# Patient Record
Sex: Female | Born: 1945 | Race: Black or African American | Hispanic: No | Smoking: Former smoker
Health system: Southern US, Community
[De-identification: ages and names within clinical notes are randomized; demographics above are authoritative.]

## PROBLEM LIST (undated history)

## (undated) DIAGNOSIS — E1142 Type 2 diabetes mellitus with diabetic polyneuropathy: Secondary | ICD-10-CM

## (undated) DIAGNOSIS — I10 Essential (primary) hypertension: Secondary | ICD-10-CM

## (undated) DIAGNOSIS — G8929 Other chronic pain: Secondary | ICD-10-CM

## (undated) DIAGNOSIS — M1990 Unspecified osteoarthritis, unspecified site: Secondary | ICD-10-CM

## (undated) DIAGNOSIS — E119 Type 2 diabetes mellitus without complications: Secondary | ICD-10-CM

## (undated) DIAGNOSIS — M545 Low back pain, unspecified: Secondary | ICD-10-CM

## (undated) DIAGNOSIS — M25561 Pain in right knee: Secondary | ICD-10-CM

## (undated) DIAGNOSIS — D649 Anemia, unspecified: Secondary | ICD-10-CM

## (undated) DIAGNOSIS — M109 Gout, unspecified: Secondary | ICD-10-CM

## (undated) DIAGNOSIS — M25562 Pain in left knee: Secondary | ICD-10-CM

## (undated) DIAGNOSIS — R269 Unspecified abnormalities of gait and mobility: Secondary | ICD-10-CM

## (undated) DIAGNOSIS — N189 Chronic kidney disease, unspecified: Secondary | ICD-10-CM

## (undated) DIAGNOSIS — M6282 Rhabdomyolysis: Secondary | ICD-10-CM

## (undated) DIAGNOSIS — R29898 Other symptoms and signs involving the musculoskeletal system: Secondary | ICD-10-CM

## (undated) DIAGNOSIS — H409 Unspecified glaucoma: Secondary | ICD-10-CM

## (undated) DIAGNOSIS — I509 Heart failure, unspecified: Secondary | ICD-10-CM

## (undated) HISTORY — DX: Unspecified abnormalities of gait and mobility: R26.9

## (undated) HISTORY — DX: Gout, unspecified: M10.9

## (undated) HISTORY — PX: OTHER SURGICAL HISTORY: SHX169

## (undated) HISTORY — DX: Other chronic pain: G89.29

## (undated) HISTORY — DX: Heart failure, unspecified: I50.9

## (undated) HISTORY — DX: Unspecified glaucoma: H40.9

## (undated) HISTORY — DX: Anemia, unspecified: D64.9

## (undated) HISTORY — PX: TIBIA FRACTURE SURGERY: SHX806

## (undated) HISTORY — DX: Chronic kidney disease, unspecified: N18.9

## (undated) HISTORY — DX: Other symptoms and signs involving the musculoskeletal system: R29.898

## (undated) HISTORY — DX: Type 2 diabetes mellitus with diabetic polyneuropathy: E11.42

## (undated) HISTORY — PX: FRACTURE SURGERY: SHX138

## (undated) HISTORY — DX: Pain in right knee: M25.561

## (undated) HISTORY — DX: Pain in left knee: M25.562

## (undated) HISTORY — DX: Rhabdomyolysis: M62.82

---

## 1997-11-11 ENCOUNTER — Ambulatory Visit: Admission: RE | Admit: 1997-11-11 | Discharge: 1997-11-11 | Payer: Self-pay | Admitting: Internal Medicine

## 1997-12-16 ENCOUNTER — Other Ambulatory Visit: Admission: RE | Admit: 1997-12-16 | Discharge: 1997-12-16 | Payer: Self-pay | Admitting: Internal Medicine

## 1998-01-07 ENCOUNTER — Ambulatory Visit (HOSPITAL_COMMUNITY): Admission: RE | Admit: 1998-01-07 | Discharge: 1998-01-07 | Payer: Self-pay | Admitting: Gastroenterology

## 1998-12-30 ENCOUNTER — Ambulatory Visit (HOSPITAL_COMMUNITY): Admission: RE | Admit: 1998-12-30 | Discharge: 1998-12-30 | Payer: Self-pay | Admitting: Internal Medicine

## 1998-12-30 ENCOUNTER — Encounter: Payer: Self-pay | Admitting: Internal Medicine

## 1999-09-15 ENCOUNTER — Encounter: Admission: RE | Admit: 1999-09-15 | Discharge: 1999-09-15 | Payer: Self-pay | Admitting: Obstetrics & Gynecology

## 1999-09-30 ENCOUNTER — Encounter: Payer: Self-pay | Admitting: Obstetrics & Gynecology

## 1999-09-30 ENCOUNTER — Ambulatory Visit (HOSPITAL_COMMUNITY): Admission: RE | Admit: 1999-09-30 | Discharge: 1999-09-30 | Payer: Self-pay | Admitting: Obstetrics & Gynecology

## 1999-11-05 ENCOUNTER — Encounter: Admission: RE | Admit: 1999-11-05 | Discharge: 1999-11-05 | Payer: Self-pay | Admitting: Obstetrics

## 2000-07-15 ENCOUNTER — Ambulatory Visit (HOSPITAL_COMMUNITY): Admission: RE | Admit: 2000-07-15 | Discharge: 2000-07-15 | Payer: Self-pay | Admitting: Internal Medicine

## 2000-07-15 ENCOUNTER — Encounter: Payer: Self-pay | Admitting: Internal Medicine

## 2000-08-04 ENCOUNTER — Other Ambulatory Visit: Admission: RE | Admit: 2000-08-04 | Discharge: 2000-08-04 | Payer: Self-pay | Admitting: Internal Medicine

## 2000-08-13 ENCOUNTER — Emergency Department (HOSPITAL_COMMUNITY): Admission: EM | Admit: 2000-08-13 | Discharge: 2000-08-13 | Payer: Self-pay | Admitting: Emergency Medicine

## 2000-08-13 ENCOUNTER — Encounter: Payer: Self-pay | Admitting: Emergency Medicine

## 2000-12-15 ENCOUNTER — Encounter: Admission: RE | Admit: 2000-12-15 | Discharge: 2000-12-15 | Payer: Self-pay | Admitting: Internal Medicine

## 2000-12-15 ENCOUNTER — Encounter: Payer: Self-pay | Admitting: Internal Medicine

## 2001-02-02 ENCOUNTER — Encounter: Admission: RE | Admit: 2001-02-02 | Discharge: 2001-02-02 | Payer: Self-pay | Admitting: Obstetrics

## 2001-02-26 ENCOUNTER — Encounter: Payer: Self-pay | Admitting: Internal Medicine

## 2001-02-26 ENCOUNTER — Emergency Department (HOSPITAL_COMMUNITY): Admission: EM | Admit: 2001-02-26 | Discharge: 2001-02-26 | Payer: Self-pay | Admitting: Emergency Medicine

## 2001-10-16 ENCOUNTER — Ambulatory Visit (HOSPITAL_BASED_OUTPATIENT_CLINIC_OR_DEPARTMENT_OTHER): Admission: RE | Admit: 2001-10-16 | Discharge: 2001-10-16 | Payer: Self-pay | Admitting: *Deleted

## 2001-11-07 ENCOUNTER — Other Ambulatory Visit: Admission: RE | Admit: 2001-11-07 | Discharge: 2001-11-07 | Payer: Self-pay | Admitting: Internal Medicine

## 2001-11-17 ENCOUNTER — Ambulatory Visit (HOSPITAL_COMMUNITY): Admission: RE | Admit: 2001-11-17 | Discharge: 2001-11-17 | Payer: Self-pay | Admitting: Internal Medicine

## 2001-11-17 ENCOUNTER — Encounter: Payer: Self-pay | Admitting: Internal Medicine

## 2002-03-06 ENCOUNTER — Encounter: Payer: Self-pay | Admitting: Emergency Medicine

## 2002-03-06 ENCOUNTER — Emergency Department (HOSPITAL_COMMUNITY): Admission: EM | Admit: 2002-03-06 | Discharge: 2002-03-06 | Payer: Self-pay

## 2002-03-09 ENCOUNTER — Ambulatory Visit (HOSPITAL_COMMUNITY): Admission: RE | Admit: 2002-03-09 | Discharge: 2002-03-09 | Payer: Self-pay | Admitting: Emergency Medicine

## 2002-03-09 ENCOUNTER — Encounter: Payer: Self-pay | Admitting: Emergency Medicine

## 2002-12-06 ENCOUNTER — Encounter: Payer: Self-pay | Admitting: *Deleted

## 2002-12-06 ENCOUNTER — Encounter: Admission: RE | Admit: 2002-12-06 | Discharge: 2002-12-06 | Payer: Self-pay | Admitting: *Deleted

## 2002-12-07 ENCOUNTER — Ambulatory Visit (HOSPITAL_BASED_OUTPATIENT_CLINIC_OR_DEPARTMENT_OTHER): Admission: RE | Admit: 2002-12-07 | Discharge: 2002-12-07 | Payer: Self-pay | Admitting: *Deleted

## 2003-02-11 ENCOUNTER — Encounter: Payer: Self-pay | Admitting: Internal Medicine

## 2003-02-11 ENCOUNTER — Encounter: Admission: RE | Admit: 2003-02-11 | Discharge: 2003-02-11 | Payer: Self-pay | Admitting: Internal Medicine

## 2005-12-15 ENCOUNTER — Encounter: Admission: RE | Admit: 2005-12-15 | Discharge: 2005-12-15 | Payer: Self-pay | Admitting: Internal Medicine

## 2007-02-02 ENCOUNTER — Encounter: Admission: RE | Admit: 2007-02-02 | Discharge: 2007-02-02 | Payer: Self-pay | Admitting: Internal Medicine

## 2008-02-28 ENCOUNTER — Encounter: Admission: RE | Admit: 2008-02-28 | Discharge: 2008-02-28 | Payer: Self-pay | Admitting: Internal Medicine

## 2009-03-18 ENCOUNTER — Encounter: Admission: RE | Admit: 2009-03-18 | Discharge: 2009-03-18 | Payer: Self-pay | Admitting: Internal Medicine

## 2010-04-07 ENCOUNTER — Encounter: Admission: RE | Admit: 2010-04-07 | Discharge: 2010-04-07 | Payer: Self-pay | Admitting: Internal Medicine

## 2010-07-10 ENCOUNTER — Emergency Department (HOSPITAL_COMMUNITY)
Admission: EM | Admit: 2010-07-10 | Discharge: 2010-07-10 | Disposition: A | Discharge status: Home / Self Care | Payer: No Typology Code available for payment source | Attending: Emergency Medicine | Admitting: Emergency Medicine

## 2010-07-10 ENCOUNTER — Emergency Department (HOSPITAL_COMMUNITY): Payer: Self-pay

## 2010-07-10 DIAGNOSIS — M549 Dorsalgia, unspecified: Secondary | ICD-10-CM | POA: Insufficient documentation

## 2010-07-10 DIAGNOSIS — I10 Essential (primary) hypertension: Secondary | ICD-10-CM | POA: Insufficient documentation

## 2010-10-16 NOTE — H&P (Signed)
[facility]  Patient:    Sarah Colon, Sarah Colon Visit Number: [id number] MRN: [id number]          Service Type: NES Location: [place] Attending Physician:  Larene Beach Dictated by:   Janit Pagan., M.D. Admit Date:  10/16/2001                           History and Physical  CHIEF COMPLAINT:  This patient, age 65, was seen in our office on Oct 04, 2001, with a chief complaint of dysuria and getting up three to four times a night, and this had been getting progressively worse for at least seven days.  HISTORY OF PRESENT ILLNESS:  This 65 year old female has been followed at irregular intervals in our office since 1992, when she had transverse myelitis and had difficulty voiding following this problem.  Investigations revealed that she had chronic cystitis, urethral strictures, and she had dilatation under anesthesia done in 1993, and again in 1997.  When she was seen in our office on Oct 04, 2001, she was dilated to 57 [place], had about 5 or 6 oz of residual urine, was given Urecholine 25 mg to use p.r.n. to help her void more frequently, Cipro 500 mg daily pending culture report, which showed no growth. The patient called back on Oct 13, 2001, reporting that she was still having bladder pain, voiding frequently in small amounts, and did not feel like she was emptying her bladder well.  The patient had not passed any gross blood, gravel, or stone.  Had no fever.  She was scheduled on Oct 16, 2001, for dilatation under anesthesia.  ALLERGIES:  CODEINE.  PRESENT MEDICATIONS:  Darvocet-N 100.  Amitriptyline at bedtime.  Hyzaar for hypertension.  Cipro for prophylaxis against infection.  Urecholine 25 mg p.r.n. to help her void.  PAST MEDICAL HISTORY:  The patient has had cystoscopy, hydrodistention, and urethral dilation carried out in 1993, and again in 1997.  She has had two back operations in 1992.  Suspected of having transverse myelitis.   Has had some difficulty with her bladder ever since that time.  FAMILY HISTORY:  Father died of cirrhosis, age 67.  Mother living and well, in her late 27s.  Four brothers, five sisters; one sister with kidney stones. Mother and the patient have had hypertension.  No diabetes in the family.  No other familial diseases noted.  SOCIAL HISTORY:  The patient is an unemployed female, on [place], with her closest relationship to her daughter.  She does not abuse alcohol.  She occasionally smokes.  REVIEW OF SYSTEMS:  General health has been only fair to poor.  Weight has been stable, with moderate obesity.  HEENT:  Unremarkable except for bad eyes. CARDIORESPIRATORY:  Denies any asthma, chest pain, or heart attack.  She does smoke two to three cigarettes daily.  GASTROINTESTINAL:  Denies hepatitis or jaundice but does suffer constipation.  BONES, JOINTS, MUSCLES:  Unremarkable. NEUROPSYCHIATRIC:  Has a bad back.  LYMPHATIC, HEMATOPOIETIC:  No anemia or nodes.  SKIN:  No lesions noted.  PHYSICAL EXAMINATION:  GENERAL:  Well-developed, well-nourished 65 year old female.  VITAL SIGNS:  Weight 140.  Temperature 98.6, pulse 80, respirations 15, blood pressure 159/88.  HEENT:  Ears and tympanic membranes are unremarkable.  Eyes react normally to light and accommodation.  Extraocular movements intact.  Pharynx benign. Teeth in fairly good condition.  NECK:  No enlargement of thyroid.  No nodes  palpable.  CHEST:  Clear to percussion and auscultation.  HEART:  Normal sinus rhythm.  No murmur.  Not enlarged.  BREASTS:  Not examined.  ABDOMEN:  Liver, kidney, spleen, masses, hernia not detected.  She is tender in the suprapubic area.  She has an umbilical scar from laparoscopic surgery.  GENITOURINARY:  External genitalia and meatus are normal.  The support is fairly good.  The uterus is anterior.  Rectal tone is good.  Urethra was tender after dilation.  EXTREMITIES:  No edema.  Good  peripheral pulses.  NEUROLOGIC:  Grossly normal reflexes and sensation.  SKIN:  No lesions.  NEUROLOGIC, HEMATOPOIETIC:  No anemia or nodes.  DIAGNOSES: 1. Urethral stricture. 2. Neurogenic bladder. 3. Recurrent urinary tract infections and chronic cystitis. 4. History of transverse myelitis in 1992. 5. Post back surgery x 2 in 1992.  PLAN:  Cystoscopy, urethral dilation, and hydrodistention. Dictated by:   Janit Pagan., M.D. Attending Physician:  Larene Beach DD:  10/13/01 TD:  10/15/01 Job: [id number]

## 2010-10-16 NOTE — Op Note (Signed)
   NAME:  Sarah Colon, Sarah Colon                        ACCOUNT NO.:  [id number]   MEDICAL RECORD NO.:  [id number]                   PATIENT TYPE:  AMB   LOCATION:  NESC                                 FACILITY:  [facility]   PHYSICIAN:  Raelyn Mora., M.D.            DATE OF BIRTH:  [birth year]   DATE OF PROCEDURE:  12/07/2002  DATE OF DISCHARGE:                                 OPERATIVE REPORT   PREOPERATIVE DIAGNOSES:  1. Interstitial cystitis.  2. Urethral stricture.  3. Neurogenic bladder.  4. Recurrent urinary tract infection.  5. Hypertension.  6. Transverse myelitis at onset of neurogenic bladder in 1992.   POSTOPERATIVE DIAGNOSES:  1. Interstitial cystitis.  2. Urethral stricture.  3. Neurogenic bladder.  4. Recurrent urinary tract infection.  5. Hypertension.  6. Transverse myelitis at onset of neurogenic bladder in 1992.   OPERATION PERFORMED:  1. Cystoscopy.  2. Hydrodistention.  3. Urethral dilation.  4. Clorpactin irrigation of the bladder.   DESCRIPTION OF OPERATION:  This 65 year old female brought to the operating  room after receiving IV gentamicin and underwent successful induction of  general anesthesia, was prepped and draped in the lithotomy position.  The  urethra was dilated to 46 [place] and was moderately tight.  She did have a  significant residual urine estimated at five or six ounces.  The bladder was  inspected with the 70 and 12 degree lenses.  Initially there was only mild  to moderate hyperemia and erythema, no stone or tumor was noted.  The right  and left ureteral orifices were normal.  The patient did have 2-3+  trabeculation of the bladder.  The bladder was then distended to capacity,  which was 800 mL, and decompressed, and she developed severe large and small  hemorrhages throughout the bladder, actually a little less marked on the  trigone than they were on the anterior and lateral walls and posterior  walls.  The patient's bladder was then  emptied and irrigated with 1000 mL of  Clorpactin solution and emptied again, and the patient returned to the  recovery area in a stable condition.   The plan is for this patient to go home on Cipro 1000 XR x3 doses.  She will  return to the office in two weeks, and she will use Darvocet-N 100 p.r.n.  for pain.                                                Raelyn Mora., M.D.    HB/MEDQ  D:  12/07/2002  T:  12/07/2002  Job:  [id number]

## 2010-10-16 NOTE — H&P (Signed)
NAME:  Sarah Colon, Sarah Colon                        ACCOUNT NO.:  [id number]   MEDICAL RECORD NO.:  [id number]                   PATIENT TYPE:  AMB   LOCATION:  NESC                                 FACILITY:  [facility]   PHYSICIAN:  Raelyn Mora., M.D.            DATE OF BIRTH:  December 28, 1945   DATE OF ADMISSION:  DATE OF DISCHARGE:                                HISTORY & PHYSICAL   HISTORY OF PRESENT ILLNESS:  This patient, age 65, is scheduled for  cystoscopy, hydrodistention, and possible Clorpactin at [facility] on December 07, 2002, with a chief complaint of low abdominal pain and  dysuria.  The patient has passed no blood, gravel, or stones.  She gets up  as much as six times a night and voids every one to two hours during the  day.  An ultrasound in our office did reveal 4-5 ounces of residual urine.  She has a history of urethral strictures and neurogenic bladder dating back  to at least 0000000, complicated by urinary tract infections.  At that time,  she also had transverse myelitis.  Recently the patient has had increasing  low abdominal pain.  Her urinalysis showed no growth at our office.  There  was no hydronephrosis, right or left by ultrasound.   ALLERGIES:  She is allergic to CODEINE.   MEDICATIONS:  Her present medications include amitriptyline and Darvocet-N  100.  The patient supposedly has been taking Hyzaar for hypertension.  She  has used Urecholine in the past to help her bladder empty.   PAST MEDICAL HISTORY:  The patient has had urethral dilation, cystoscopy,  and hydrodistention carried out in 1993, in 1997, and in 2003 with some  improvement in her symptoms.  She had back operations in 1992.  She has been  diagnosed with transverse myelitis.  She has had bladder difficulties since  that time.   FAMILY HISTORY:  Her father died of cirrhosis at age 73.  Mother living and  well in her 18s.  Four brothers and five sisters.  One sister with kidney  stones.   Her mother has a hypertension.  There is no diabetes in the family  to the patient's knowledge.   SOCIAL HISTORY:  The patient is an unemployed female in [place] with the  closer relationship to her daughter.  She does not abuse alcohol.  She does  smoke occasionally.   REVIEW OF SYSTEMS:  GENERAL:  Health has been fair to poor.  Weight has been  stable.  She has mild to moderate obesity.  HEENT:  She has bad eyes.  Teeth  are fairly good.  CARDIORESPIRATORY:  Denies any chest pain, heart attack,  or asthma.  She does smoke two or three cigarettes daily.  GASTROINTESTINAL:  She denies hepatitis or jaundice, but does suffer constipation.  BONES,  JOINTS, AND MUSCLES:  Unremarkable.  NEUROPSYCHIATRIC:  She has a bad  back  and chronic back pain.  LYMPHATIC/HEMATOPOIETIC:  No anemia or nodes.  SKIN:  No lesions.   PHYSICAL EXAMINATION:  GENERAL APPEARANCE:  A well-developed, well-  nourished, 65 year old female.  VITAL SIGNS:  The temperature is 98.1 degrees, pulse 82, respirations 18,  and blood pressure 142/71.  WEIGHT:  138 pounds.  HEENT:  The ears and tympanic membranes are unremarkable.  The eyes react  normally to light and accomodation.  Extraocular movements intact.  Pharynx  benign.  Teeth in fair condition.  NECK:  No enlargement of the thyroid.  No nodes palpable.  CHEST:  Clear to auscultation and percussion.  HEART:  Normal sinus rhythm.  No murmur.  Not enlarged.  BREASTS:  Not examined.  ABDOMEN:  Liver, spleen, kidneys, masses, and hernias not detected.  She has  marked suprapubic tenderness.  There is an umbilical scar from laparoscopic  surgery.  PELVIC:  External genitalia and meatus are normal.  Support is fair.  The  uterus is anterior.  Rectal tone is good.  The urethra was tender as was the  bladder.  EXTREMITIES:  No edema.  Good peripheral pulses.  NEUROLOGIC:  Grossly normal reflexes and sensation.  SKIN:  No lesions.  HEMATOPOIETIC/LYMPHATIC:  No nodes  or anemia noted.   DIAGNOSES:  1. Urethral stricture.  2. Neurogenic bladder.  3. Suspect chronic cystitis, possibly interstitial.  4. Transverse myelitis in 1992.  5. Status post back surgeries in 1992 x 2.   PLAN:  1. Urethral dilatation.  2. Cystoscopy.  3. Hydrodistention.  4. Possible Clorpactin.                                               Raelyn Mora., M.D.    HB/MEDQ  D:  12/06/2002  T:  12/06/2002  Job:  [id number]

## 2010-10-16 NOTE — Op Note (Signed)
[facility]  Patient:    Sarah Colon, Sarah Colon Visit Number: [id number] MRN: [id number]          Service Type: NES Location: [place] Attending Physician:  Larene Beach Dictated by:   Janit Pagan., M.D. Proc. Date: 10/16/01 Admit Date:  10/16/2001                             Operative Report  DATE OF BIRTH:  [birth year]  PREOPERATIVE DIAGNOSES: 1. Urethral strictures. 2. Recurrent urinary tract infections. 3. Chronic cystitis. 4. Hypertension. 5. Transverse myelitis and neurogenic bladder 1992.  POSTOPERATIVE DIAGNOSES: 1. Urethral strictures. 2. Recurrent urinary tract infections. 3. Chronic cystitis. 4. Hypertension. 5. Transverse myelitis and neurogenic bladder 1992.  OPERATION PERFORMED: 1. Cystoscopy. 2. Urethral dilation.  DESCRIPTION OF OPERATION:  This patient, age 29, was prepared for surgery with IV gentamicin and underwent successful induction of general anesthesia, was prepped and draped in the lithotomy position.  The urethra, which was tight, was dilated with 28-36 female sounds, and there was minimal amount of bleeding.  The bladder was then inspected with the #22 cystourethroscope using 70 and 12 degree lenses.  The patients bladder showed mild hyperemia and erythema throughout, grade 1 trabeculation; no stone, no tumor was noted in the bladder.  The right and left ureteral orifices were normal.  The trigone had good estrogen effect.  The patients bladder capacity was noted to be 550 mL, and she developed no hemorrhages and only minimal increase in hyperemia and erythema after hydrodistention.  The patients bladder was emptied.  She was examined under anesthesia.  There were no rectal or pelvis noted.  Bladder support was fairly good.  Rectal tone was fairly good.  PLAN: 1. The plan is for the patient to go home on cephalexin 500 b.i.d. for    prophylaxis since her last culture in the office showed no  growth. 2. We will give her Darvocet-N 100 q.6-8h. p.r.n. pain. 3. We will have her return to the office in 1-2 weeks to check her residual    urine. 4. She will continue her Hyzaar and her amitriptyline at bedtime.  She is to    hold off on the Urecholine which did not seem to help her symptoms. Dictated by:   Janit Pagan., M.D. Attending Physician:  Larene Beach DD:  10/16/01 TD:  10/17/01 Job: [id number]

## 2011-04-13 ENCOUNTER — Other Ambulatory Visit: Payer: Self-pay | Admitting: Internal Medicine

## 2011-04-13 DIAGNOSIS — Z1231 Encounter for screening mammogram for malignant neoplasm of breast: Secondary | ICD-10-CM

## 2011-05-06 ENCOUNTER — Ambulatory Visit: Payer: No Typology Code available for payment source

## 2011-05-20 ENCOUNTER — Ambulatory Visit: Payer: No Typology Code available for payment source

## 2011-06-11 ENCOUNTER — Ambulatory Visit: Payer: No Typology Code available for payment source

## 2011-07-07 ENCOUNTER — Ambulatory Visit: Payer: No Typology Code available for payment source

## 2011-07-21 ENCOUNTER — Ambulatory Visit
Admission: RE | Admit: 2011-07-21 | Discharge: 2011-07-21 | Disposition: A | Discharge status: Home / Self Care | Payer: Medicare Other | Source: Ambulatory Visit | Attending: Internal Medicine | Admitting: Internal Medicine

## 2011-07-21 DIAGNOSIS — Z1231 Encounter for screening mammogram for malignant neoplasm of breast: Secondary | ICD-10-CM

## 2013-03-02 ENCOUNTER — Other Ambulatory Visit: Payer: Self-pay | Admitting: Family

## 2013-03-02 ENCOUNTER — Ambulatory Visit
Admission: RE | Admit: 2013-03-02 | Discharge: 2013-03-02 | Disposition: A | Discharge status: Home / Self Care | Payer: Medicare Other | Source: Ambulatory Visit | Attending: Family | Admitting: Family

## 2013-03-02 DIAGNOSIS — M25512 Pain in left shoulder: Secondary | ICD-10-CM

## 2013-04-07 ENCOUNTER — Emergency Department (HOSPITAL_COMMUNITY): Payer: Medicare Other

## 2013-04-07 ENCOUNTER — Encounter (HOSPITAL_COMMUNITY): Payer: Self-pay | Admitting: Emergency Medicine

## 2013-04-07 ENCOUNTER — Inpatient Hospital Stay (HOSPITAL_COMMUNITY)
Admission: EM | Admit: 2013-04-07 | Discharge: 2013-04-17 | DRG: 871 | Disposition: A | Discharge status: Skilled Nursing Facility | Payer: Medicare Other | Attending: Internal Medicine | Admitting: Internal Medicine

## 2013-04-07 DIAGNOSIS — Z7401 Bed confinement status: Secondary | ICD-10-CM

## 2013-04-07 DIAGNOSIS — Z87891 Personal history of nicotine dependence: Secondary | ICD-10-CM

## 2013-04-07 DIAGNOSIS — M25562 Pain in left knee: Secondary | ICD-10-CM

## 2013-04-07 DIAGNOSIS — R32 Unspecified urinary incontinence: Secondary | ICD-10-CM | POA: Diagnosis not present

## 2013-04-07 DIAGNOSIS — N179 Acute kidney failure, unspecified: Secondary | ICD-10-CM | POA: Diagnosis present

## 2013-04-07 DIAGNOSIS — I5032 Chronic diastolic (congestive) heart failure: Secondary | ICD-10-CM | POA: Diagnosis present

## 2013-04-07 DIAGNOSIS — I10 Essential (primary) hypertension: Secondary | ICD-10-CM | POA: Diagnosis present

## 2013-04-07 DIAGNOSIS — K5900 Constipation, unspecified: Secondary | ICD-10-CM | POA: Clinically undetermined

## 2013-04-07 DIAGNOSIS — K7689 Other specified diseases of liver: Secondary | ICD-10-CM | POA: Diagnosis present

## 2013-04-07 DIAGNOSIS — M25569 Pain in unspecified knee: Secondary | ICD-10-CM | POA: Diagnosis present

## 2013-04-07 DIAGNOSIS — E119 Type 2 diabetes mellitus without complications: Secondary | ICD-10-CM

## 2013-04-07 DIAGNOSIS — K760 Fatty (change of) liver, not elsewhere classified: Secondary | ICD-10-CM | POA: Diagnosis present

## 2013-04-07 DIAGNOSIS — E1149 Type 2 diabetes mellitus with other diabetic neurological complication: Secondary | ICD-10-CM | POA: Diagnosis present

## 2013-04-07 DIAGNOSIS — E1142 Type 2 diabetes mellitus with diabetic polyneuropathy: Secondary | ICD-10-CM | POA: Diagnosis present

## 2013-04-07 DIAGNOSIS — E11610 Type 2 diabetes mellitus with diabetic neuropathic arthropathy: Secondary | ICD-10-CM | POA: Diagnosis present

## 2013-04-07 DIAGNOSIS — R700 Elevated erythrocyte sedimentation rate: Secondary | ICD-10-CM

## 2013-04-07 DIAGNOSIS — N289 Disorder of kidney and ureter, unspecified: Secondary | ICD-10-CM

## 2013-04-07 DIAGNOSIS — L02419 Cutaneous abscess of limb, unspecified: Secondary | ICD-10-CM | POA: Diagnosis present

## 2013-04-07 DIAGNOSIS — E876 Hypokalemia: Secondary | ICD-10-CM | POA: Diagnosis present

## 2013-04-07 DIAGNOSIS — R7401 Elevation of levels of liver transaminase levels: Secondary | ICD-10-CM | POA: Diagnosis present

## 2013-04-07 DIAGNOSIS — Z79899 Other long term (current) drug therapy: Secondary | ICD-10-CM

## 2013-04-07 DIAGNOSIS — M109 Gout, unspecified: Secondary | ICD-10-CM | POA: Diagnosis present

## 2013-04-07 DIAGNOSIS — D72829 Elevated white blood cell count, unspecified: Secondary | ICD-10-CM | POA: Diagnosis present

## 2013-04-07 DIAGNOSIS — K759 Inflammatory liver disease, unspecified: Secondary | ICD-10-CM

## 2013-04-07 DIAGNOSIS — D473 Essential (hemorrhagic) thrombocythemia: Secondary | ICD-10-CM | POA: Diagnosis not present

## 2013-04-07 DIAGNOSIS — M064 Inflammatory polyarthropathy: Secondary | ICD-10-CM | POA: Diagnosis present

## 2013-04-07 DIAGNOSIS — G8929 Other chronic pain: Secondary | ICD-10-CM | POA: Diagnosis present

## 2013-04-07 DIAGNOSIS — I5033 Acute on chronic diastolic (congestive) heart failure: Secondary | ICD-10-CM | POA: Diagnosis present

## 2013-04-07 DIAGNOSIS — A419 Sepsis, unspecified organism: Principal | ICD-10-CM | POA: Diagnosis present

## 2013-04-07 DIAGNOSIS — G589 Mononeuropathy, unspecified: Secondary | ICD-10-CM | POA: Diagnosis present

## 2013-04-07 DIAGNOSIS — I509 Heart failure, unspecified: Secondary | ICD-10-CM | POA: Diagnosis present

## 2013-04-07 DIAGNOSIS — Z113 Encounter for screening for infections with a predominantly sexual mode of transmission: Secondary | ICD-10-CM

## 2013-04-07 DIAGNOSIS — M009 Pyogenic arthritis, unspecified: Secondary | ICD-10-CM | POA: Diagnosis present

## 2013-04-07 DIAGNOSIS — M25561 Pain in right knee: Secondary | ICD-10-CM

## 2013-04-07 DIAGNOSIS — R262 Difficulty in walking, not elsewhere classified: Secondary | ICD-10-CM | POA: Diagnosis present

## 2013-04-07 HISTORY — DX: Unspecified osteoarthritis, unspecified site: M19.90

## 2013-04-07 HISTORY — DX: Essential (primary) hypertension: I10

## 2013-04-07 LAB — URINALYSIS, ROUTINE W REFLEX MICROSCOPIC
Bilirubin Urine: NEGATIVE
Ketones, ur: NEGATIVE mg/dL
Nitrite: NEGATIVE
pH: 5 (ref 5.0–8.0)

## 2013-04-07 LAB — POCT I-STAT, CHEM 8
BUN: 35 mg/dL — ABNORMAL HIGH (ref 6–23)
Chloride: 101 mEq/L (ref 96–112)
Creatinine, Ser: 1.6 mg/dL — ABNORMAL HIGH (ref 0.50–1.10)
Hemoglobin: 13.6 g/dL (ref 12.0–15.0)
Potassium: 4 mEq/L (ref 3.5–5.1)
Sodium: 140 mEq/L (ref 135–145)

## 2013-04-07 LAB — URINE MICROSCOPIC-ADD ON

## 2013-04-07 LAB — CBC WITH DIFFERENTIAL/PLATELET
HCT: 34.6 % — ABNORMAL LOW (ref 36.0–46.0)
Hemoglobin: 12.7 g/dL (ref 12.0–15.0)
Lymphocytes Relative: 8 % — ABNORMAL LOW (ref 12–46)
Lymphs Abs: 1.6 10*3/uL (ref 0.7–4.0)
MCV: 81.6 fL (ref 78.0–100.0)
Monocytes Absolute: 2.1 10*3/uL — ABNORMAL HIGH (ref 0.1–1.0)
Monocytes Relative: 10 % (ref 3–12)
Neutro Abs: 16.4 10*3/uL — ABNORMAL HIGH (ref 1.7–7.7)
WBC: 20.2 10*3/uL — ABNORMAL HIGH (ref 4.0–10.5)

## 2013-04-07 MED ORDER — SODIUM CHLORIDE 0.9 % IV BOLUS (SEPSIS)
1000.0000 mL | Freq: Once | INTRAVENOUS | Status: AC
  Administered 2013-04-07: 1000 mL via INTRAVENOUS

## 2013-04-07 MED ORDER — DEXTROSE 5 % IV SOLN
1.0000 g | Freq: Once | INTRAVENOUS | Status: AC
  Administered 2013-04-07: 1 g via INTRAVENOUS
  Filled 2013-04-07: qty 10

## 2013-04-07 MED ORDER — SODIUM CHLORIDE 0.9 % IV BOLUS (SEPSIS)
500.0000 mL | Freq: Once | INTRAVENOUS | Status: AC
  Administered 2013-04-07: 500 mL via INTRAVENOUS

## 2013-04-07 MED ORDER — MORPHINE SULFATE 4 MG/ML IJ SOLN
4.0000 mg | Freq: Once | INTRAMUSCULAR | Status: AC
  Administered 2013-04-07: 4 mg via INTRAVENOUS
  Filled 2013-04-07: qty 1

## 2013-04-07 MED ORDER — ACETAMINOPHEN 325 MG PO TABS
650.0000 mg | ORAL_TABLET | Freq: Once | ORAL | Status: AC
  Administered 2013-04-07: 650 mg via ORAL
  Filled 2013-04-07: qty 2

## 2013-04-07 NOTE — ED Notes (Signed)
Pt arrives via EMS, pt states she started having pain in her neck and side about one month prior and her daughter made her go to her PCP who DX her with RA.  Pt states tonight he daughter made her call 911 after she was having knee pain not relieved by her tramadol.

## 2013-04-07 NOTE — ED Notes (Signed)
Pt taken to xray 

## 2013-04-07 NOTE — ED Provider Notes (Signed)
CSN: [id number]     Arrival date & time 04/07/13  1906 History   First MD Initiated Contact with Patient 04/07/13 1950     Chief Complaint  Patient presents with  . Knee Pain   (Consider location/radiation/quality/duration/timing/severity/associated sxs/prior Treatment) HPI  67 year old female with non-complicated diabetes, arthritis presents for evaluation of knee pain. Patient arrived via EMS. Patient states she does have a history of arthritis with occasional joint pain however for the past week she has been having progressive worsening pain to several joints including her shoulders and her knees. Describe pain as a throbbing sensation, described as if somebody is pounding on it persistently. Pain persists throughout the day and for the past 3 days she is having increased difficulty ambulating even when using a cane. She tries taking Ultram at home which provide minimal relief. States she was having some joint discomfort and did mention that to her PCPs office last week. States she received x-ray of the left shoulder and both knees and was told that she has arthritis. Patient otherwise denies fever chills, nausea vomiting or diarrhea, chest pain or shortness of breath, abdominal pain or back pain, or rash. She denies any vision changes or having dysuria.    Past Medical History  Diagnosis Date  . Arthritis   . Hypertension   . Diabetes mellitus without complication    Past Surgical History  Procedure Laterality Date  . Skin grafts     No family history on file. History  Substance Use Topics  . Smoking status: Former Research scientist (life sciences)  . Smokeless tobacco: Not on file  . Alcohol Use: No   OB History   Grav Para Term Preterm Abortions TAB SAB Ect Mult Living                 Review of Systems  All other systems reviewed and are negative.    Allergies  Codeine  Home Medications   Current Outpatient Rx  Name  Route  Sig  Dispense  Refill  . traMADol (ULTRAM) 50 MG tablet   Oral  Take 50 mg by mouth 3 (three) times daily as needed (pain).          . ALPHAGAN P 0.1 % SOLN               . amLODipine-benazepril (LOTREL) 10-20 MG per capsule               . dorzolamide-timolol (COSOPT) 22.3-6.8 MG/ML ophthalmic solution               . gabapentin (NEURONTIN) 600 MG tablet               . lidocaine (LIDODERM) 5 %               . LUMIGAN 0.01 % SOLN               . meloxicam (MOBIC) 15 MG tablet               . metFORMIN (GLUCOPHAGE-XR) 500 MG 24 hr tablet               . sulfamethoxazole-trimethoprim (BACTRIM DS) 800-160 MG per tablet               . valsartan-hydrochlorothiazide (DIOVAN-HCT) 320-25 MG per tablet               . VICODIN 5-300 MG TABS  BP 151/85  Pulse 113  Temp(Src) 98.8 F (37.1 C) (Oral)  Resp 19  Ht 4\' 11"  (1.499 m)  Wt 124 lb (56.246 kg)  BMI 25.03 kg/m2  SpO2 96% Physical Exam  Nursing note and vitals reviewed. Constitutional: She is oriented to person, place, and time.  Thin appearing female appears uncomfortable, but non toxic  HENT:  Head: Normocephalic and atraumatic.  Eyes: Conjunctivae are normal.  Neck: Normal range of motion. Neck supple.  Cardiovascular:  Tachycardia without M/R/G  Pulmonary/Chest: Breath sounds normal.  Abdominal: Soft.  Musculoskeletal: She exhibits tenderness (generalized tenderness to bilateral shoulder, bilateral knees and bilateral ankle with decreased ROM but no erythema or edema suggestive of septic arthritis). She exhibits no edema.  Neurological: She is alert and oriented to person, place, and time.  4/5 strength to all 4 extremities.      Skin:  Red streak on L anterior knee, and 1st MTP of R foot  Bilateral knee warm to the touch    ED Course  Procedures (including critical care time)    8:07 PM Pt here with polyarthralgia.  She is warm to the touch, and tender thoughout most major joints.  Doubt septic arthritis.   Pt has strong urine odor.  Plan to check for possible source of infection by obtaining CXR, UA, and also check rectal temp.  Will also check CRP, and Sed rate along with CK.  Rocephin given for suspect UTI.  Care discussed with attending.    10:56 PM Pt has WBC of 20.2, no recent steroid use to account for elevated WBC. Sed rate is elevated at 116. She is febrile. Tylenol given. Since her knees are warm to the touch will obtain xray to r/o osteo although less likely as it is bilateral.  No headache to suggest meningitis.  No knee surgical history worrisome for hardware infection.  Will consult [facility] for admission.    11:48 PM I have consulted with [facility], Dr. Posey Pronto, who agrees to admit pt to tele, team 10, under his care.    Labs Review Labs Reviewed  CBC WITH DIFFERENTIAL - Abnormal; Notable for the following:    WBC 20.2 (*)    HCT 34.6 (*)    MCHC 36.7 (*)    Platelets 539 (*)    Neutrophils Relative % 81 (*)    Neutro Abs 16.4 (*)    Lymphocytes Relative 8 (*)    Monocytes Absolute 2.1 (*)    All other components within normal limits  SEDIMENTATION RATE - Abnormal; Notable for the following:    Sed Rate 116 (*)    All other components within normal limits  URINALYSIS, ROUTINE W REFLEX MICROSCOPIC - Abnormal; Notable for the following:    Hgb urine dipstick TRACE (*)    All other components within normal limits  POCT I-STAT, CHEM 8 - Abnormal; Notable for the following:    BUN 35 (*)    Creatinine, Ser 1.60 (*)    Glucose, Bld 116 (*)    All other components within normal limits  CULTURE, BLOOD (ROUTINE X 2)  CULTURE, BLOOD (ROUTINE X 2)  URINE MICROSCOPIC-ADD ON  C-REACTIVE PROTEIN  CK  COMPREHENSIVE METABOLIC PANEL   Imaging Review Dg Chest 2 View  04/07/2013   CLINICAL DATA:  Fever  EXAM: CHEST  2 VIEW  COMPARISON:  None.  FINDINGS: The heart size and mediastinal contours are within normal limits. Both lungs are clear. The visualized skeletal  structures are unremarkable.  IMPRESSION: No active cardiopulmonary disease.   Electronically Signed   By: Skipper Cliche M.D.   On: 04/07/2013 22:32    EKG Interpretation   None       MDM   1. Knee pain, bilateral   2. Leukocytosis   3. Elevated sed rate   4. Renal insufficiency    BP 148/69  Pulse 117  Temp(Src) 101.2 F (38.4 C) (Rectal)  Resp 19  Ht 4\' 11"  (1.499 m)  Wt 124 lb (56.246 kg)  BMI 25.03 kg/m2  SpO2 98%     Domenic Moras, PA-C 04/11/13 1501

## 2013-04-07 NOTE — ED Notes (Signed)
Pt back from x-ray.

## 2013-04-08 ENCOUNTER — Encounter (HOSPITAL_COMMUNITY): Payer: Self-pay | Admitting: General Practice

## 2013-04-08 ENCOUNTER — Inpatient Hospital Stay (HOSPITAL_COMMUNITY): Payer: Medicare Other

## 2013-04-08 DIAGNOSIS — E119 Type 2 diabetes mellitus without complications: Secondary | ICD-10-CM

## 2013-04-08 DIAGNOSIS — M009 Pyogenic arthritis, unspecified: Secondary | ICD-10-CM

## 2013-04-08 DIAGNOSIS — A419 Sepsis, unspecified organism: Secondary | ICD-10-CM | POA: Diagnosis present

## 2013-04-08 DIAGNOSIS — M25569 Pain in unspecified knee: Secondary | ICD-10-CM

## 2013-04-08 DIAGNOSIS — R700 Elevated erythrocyte sedimentation rate: Secondary | ICD-10-CM

## 2013-04-08 DIAGNOSIS — E11610 Type 2 diabetes mellitus with diabetic neuropathic arthropathy: Secondary | ICD-10-CM | POA: Diagnosis present

## 2013-04-08 LAB — CBC
HCT: 32.7 % — ABNORMAL LOW (ref 36.0–46.0)
Platelets: 516 10*3/uL — ABNORMAL HIGH (ref 150–400)
RBC: 3.92 MIL/uL (ref 3.87–5.11)
RDW: 14.3 % (ref 11.5–15.5)
WBC: 18.9 10*3/uL — ABNORMAL HIGH (ref 4.0–10.5)

## 2013-04-08 LAB — HEPATITIS PANEL, ACUTE
Hep A IgM: NONREACTIVE
Hepatitis B Surface Ag: NEGATIVE

## 2013-04-08 LAB — URIC ACID: Uric Acid, Serum: 9.9 mg/dL — ABNORMAL HIGH (ref 2.4–7.0)

## 2013-04-08 LAB — COMPREHENSIVE METABOLIC PANEL
ALT: 165 U/L — ABNORMAL HIGH (ref 0–35)
AST: 137 U/L — ABNORMAL HIGH (ref 0–37)
Alkaline Phosphatase: 190 U/L — ABNORMAL HIGH (ref 39–117)
Alkaline Phosphatase: 193 U/L — ABNORMAL HIGH (ref 39–117)
BUN: 34 mg/dL — ABNORMAL HIGH (ref 6–23)
CO2: 26 mEq/L (ref 19–32)
CO2: 26 mEq/L (ref 19–32)
Chloride: 99 mEq/L (ref 96–112)
GFR calc Af Amer: 54 mL/min — ABNORMAL LOW (ref >90)
GFR calc Af Amer: 54 mL/min — ABNORMAL LOW (ref >90)
Glucose, Bld: 157 mg/dL — ABNORMAL HIGH (ref 70–99)
Glucose, Bld: 159 mg/dL — ABNORMAL HIGH (ref 70–99)
Potassium: 3.4 mEq/L — ABNORMAL LOW (ref 3.5–5.1)
Potassium: 3.4 mEq/L — ABNORMAL LOW (ref 3.5–5.1)
Sodium: 138 mEq/L (ref 135–145)
Total Protein: 7.3 g/dL (ref 6.0–8.3)
Total Protein: 7.4 g/dL (ref 6.0–8.3)

## 2013-04-08 LAB — SYNOVIAL CELL COUNT + DIFF, W/ CRYSTALS
Eosinophils-Synovial: 0 % (ref 0–1)
Lymphocytes-Synovial Fld: 1 % (ref 0–20)
Neutrophil, Synovial: 90 % — ABNORMAL HIGH (ref 0–25)

## 2013-04-08 LAB — GLUCOSE, CAPILLARY
Glucose-Capillary: 164 mg/dL — ABNORMAL HIGH (ref 70–99)
Glucose-Capillary: 128 mg/dL — ABNORMAL HIGH (ref 70–99)
Glucose-Capillary: 208 mg/dL — ABNORMAL HIGH (ref 70–99)
Glucose-Capillary: 186 mg/dL — ABNORMAL HIGH (ref 70–99)
Glucose-Capillary: 161 mg/dL — ABNORMAL HIGH (ref 70–99)

## 2013-04-08 LAB — PROTIME-INR: INR: 1.23 (ref 0.00–1.49)

## 2013-04-08 MED ORDER — ACETAMINOPHEN 325 MG PO TABS
650.0000 mg | ORAL_TABLET | Freq: Four times a day (QID) | ORAL | Status: DC | PRN
  Administered 2013-04-08 – 2013-04-12 (×3): 650 mg via ORAL
  Filled 2013-04-08 (×3): qty 2

## 2013-04-08 MED ORDER — VANCOMYCIN HCL 1000 MG IV SOLR
750.0000 mg | INTRAVENOUS | Status: DC
  Administered 2013-04-08 – 2013-04-09 (×2): 750 mg via INTRAVENOUS
  Filled 2013-04-08 (×2): qty 750

## 2013-04-08 MED ORDER — INSULIN ASPART 100 UNIT/ML ~~LOC~~ SOLN
0.0000 [IU] | Freq: Every day | SUBCUTANEOUS | Status: DC
  Administered 2013-04-13: 3 [IU] via SUBCUTANEOUS
  Administered 2013-04-15: 4 [IU] via SUBCUTANEOUS
  Administered 2013-04-16: 2 [IU] via SUBCUTANEOUS

## 2013-04-08 MED ORDER — COLCHICINE 0.6 MG PO TABS
0.6000 mg | ORAL_TABLET | Freq: Three times a day (TID) | ORAL | Status: DC
  Administered 2013-04-08 – 2013-04-09 (×2): 0.6 mg via ORAL
  Filled 2013-04-08 (×4): qty 1

## 2013-04-08 MED ORDER — HYDROMORPHONE HCL PF 1 MG/ML IJ SOLN: 1.0000 mg | INTRAMUSCULAR | Status: DC | PRN

## 2013-04-08 MED ORDER — GABAPENTIN 600 MG PO TABS
600.0000 mg | ORAL_TABLET | Freq: Every day | ORAL | Status: DC
  Administered 2013-04-08: 600 mg via ORAL
  Filled 2013-04-08 (×2): qty 1

## 2013-04-08 MED ORDER — INSULIN ASPART 100 UNIT/ML ~~LOC~~ SOLN
0.0000 [IU] | Freq: Three times a day (TID) | SUBCUTANEOUS | Status: DC
  Administered 2013-04-08: 5 [IU] via SUBCUTANEOUS
  Administered 2013-04-08: 2 [IU] via SUBCUTANEOUS
  Administered 2013-04-08 – 2013-04-10 (×4): 3 [IU] via SUBCUTANEOUS
  Administered 2013-04-11: 2 [IU] via SUBCUTANEOUS
  Administered 2013-04-11: 17:00:00 via SUBCUTANEOUS
  Administered 2013-04-11 – 2013-04-12 (×2): 2 [IU] via SUBCUTANEOUS
  Administered 2013-04-12: 3 [IU] via SUBCUTANEOUS
  Administered 2013-04-13 (×2): 2 [IU] via SUBCUTANEOUS
  Administered 2013-04-14: 3 [IU] via SUBCUTANEOUS
  Administered 2013-04-14 – 2013-04-15 (×3): 2 [IU] via SUBCUTANEOUS
  Administered 2013-04-15 (×2): 3 [IU] via SUBCUTANEOUS
  Administered 2013-04-16: 2 [IU] via SUBCUTANEOUS
  Administered 2013-04-16: 3 [IU] via SUBCUTANEOUS
  Administered 2013-04-16 – 2013-04-17 (×2): 2 [IU] via SUBCUTANEOUS
  Administered 2013-04-17: 3 [IU] via SUBCUTANEOUS
  Administered 2013-04-17: 8 [IU] via SUBCUTANEOUS

## 2013-04-08 MED ORDER — DEXTROSE 5 % IV SOLN
1.0000 g | INTRAVENOUS | Status: DC
  Administered 2013-04-08: 1 g via INTRAVENOUS
  Filled 2013-04-08 (×2): qty 1

## 2013-04-08 MED ORDER — MORPHINE SULFATE 2 MG/ML IJ SOLN
1.0000 mg | INTRAMUSCULAR | Status: AC | PRN
  Administered 2013-04-09 – 2013-04-10 (×4): 2 mg via INTRAVENOUS
  Filled 2013-04-08 (×6): qty 1

## 2013-04-08 MED ORDER — OXYCODONE-ACETAMINOPHEN 5-325 MG PO TABS
2.0000 | ORAL_TABLET | Freq: Four times a day (QID) | ORAL | Status: DC | PRN
  Administered 2013-04-08 – 2013-04-11 (×8): 2 via ORAL
  Filled 2013-04-08 (×8): qty 2

## 2013-04-08 MED ORDER — ONDANSETRON HCL 4 MG/2ML IJ SOLN: 4.0000 mg | Freq: Three times a day (TID) | INTRAMUSCULAR | Status: DC | PRN

## 2013-04-08 MED ORDER — TRAMADOL HCL 50 MG PO TABS
50.0000 mg | ORAL_TABLET | Freq: Three times a day (TID) | ORAL | Status: DC | PRN
  Administered 2013-04-08 – 2013-04-17 (×11): 50 mg via ORAL
  Filled 2013-04-08 (×11): qty 1

## 2013-04-08 MED ORDER — LIDOCAINE HCL 1 % IJ SOLN
5.0000 mL | Freq: Once | INTRAMUSCULAR | Status: AC
  Administered 2013-04-08: 5 mL via INTRADERMAL
  Filled 2013-04-08: qty 5

## 2013-04-08 MED ORDER — DEXTROSE 5 % IV SOLN
1.0000 g | INTRAVENOUS | Status: DC
  Filled 2013-04-08: qty 10

## 2013-04-08 MED ORDER — ACETAMINOPHEN 650 MG RE SUPP: 650.0000 mg | Freq: Four times a day (QID) | RECTAL | Status: DC | PRN

## 2013-04-08 MED ORDER — ONDANSETRON HCL 4 MG/2ML IJ SOLN: 4.0000 mg | Freq: Four times a day (QID) | INTRAMUSCULAR | Status: DC | PRN

## 2013-04-08 MED ORDER — HYDROMORPHONE HCL PF 1 MG/ML IJ SOLN: 1.0000 mg | INTRAMUSCULAR | Status: AC | PRN

## 2013-04-08 MED ORDER — HEPARIN SODIUM (PORCINE) 5000 UNIT/ML IJ SOLN
5000.0000 [IU] | Freq: Three times a day (TID) | INTRAMUSCULAR | Status: DC
  Administered 2013-04-08 – 2013-04-17 (×28): 5000 [IU] via SUBCUTANEOUS
  Filled 2013-04-08 (×31): qty 1

## 2013-04-08 MED ORDER — SODIUM CHLORIDE 0.9 % IV SOLN
INTRAVENOUS | Status: DC
  Administered 2013-04-08 – 2013-04-09 (×3): via INTRAVENOUS

## 2013-04-08 MED ORDER — COLCHICINE 0.6 MG PO TABS
1.2000 mg | ORAL_TABLET | Freq: Once | ORAL | Status: AC
  Administered 2013-04-08: 1.2 mg via ORAL
  Filled 2013-04-08: qty 2

## 2013-04-08 MED ORDER — ONDANSETRON HCL 4 MG PO TABS
4.0000 mg | ORAL_TABLET | Freq: Four times a day (QID) | ORAL | Status: DC | PRN
  Administered 2013-04-13: 4 mg via ORAL
  Filled 2013-04-08: qty 1

## 2013-04-08 NOTE — Progress Notes (Addendum)
[facility] PROGRESS NOTE  Sarah Colon [id number] DOB: [birth year] DOA: 04/07/2013 PCP: Sarah Colon., MD   Brief narrative 75 F with HTN, DM, arthritis presented with pain over her bilateral knees and ankles for  4 days. Patient febrile with leucocytosis and tachycardia. Elevated ESR.   Assessment/Plan: Sepsis secondary to? Septic arthritis Added empiric IV vancomycin for possible septic arthritis.. Noted elevated ESR, CRP and CPK. continue rocephin Ortho consulted.  Knee joint tapped and fluid sent for analysis and culture  Continue pain control. Follow blood cx  Diabetes mellitus Continue sliding scale  Hypertension Blood pressure stable  Hypokalemia  replenished    DVT prophylaxis: Subcutaneous heparin  Code Status: Full code Family Communication: None at bedside Disposition: Currently  patient  Consultants:  Dr Onnie Graham ( ortho)  Procedures:  Left knee joint   Antibiotics:  IV vancomycin and ceftriaxone ( 11/9>>)  HPI/Subjective: Patient complains of pain in her knees and ankles. Admission  H&P reviewed  Objective: Filed Vitals:   04/08/13 1335  BP: 136/63  Pulse: 98  Temp: 99.1 F (37.3 C)  Resp: 18    Intake/Output Summary (Last 24 hours) at 04/08/13 1344 Last data filed at 04/08/13 1335  Gross per 24 hour  Intake    600 ml  Output    500 ml  Net    100 ml   Filed Weights   04/07/13 1912 04/08/13 0147  Weight: 56.246 kg (124 lb) 53.4 kg (117 lb 11.6 oz)    Exam:   General:  An elderly female in no acute distress  HEENT: No pallor, moist oral mucosa  Chest: Clear to auscultation bilaterally, no added sounds  CVS: Normal S1 and S2, no murmurs or gallop  Abdomen: Soft, nontender, nondistended, bowel sounds present  Extremities: Denies swelling of left knee, tender to palpation over bilateral knees (L>R), and a palpation over her ankles. Good mobility of the legs due to pain  CNS: AAO x3   Data  Reviewed: Basic Metabolic Panel:  Recent Labs Lab 04/07/13 2010 04/08/13 0230  NA 140 138  136  K 4.0 3.4*  3.4*  CL 101 99  98  CO2  --  26  26  GLUCOSE 116* 157*  159*  BUN 35* 35*  34*  CREATININE 1.60* 1.17*  1.16*  CALCIUM  --  9.3  9.1   Liver Function Tests:  Recent Labs Lab 04/08/13 0230  AST 139*  137*  ALT 165*  163*  ALKPHOS 190*  193*  BILITOT 0.8  0.8  PROT 7.4  7.3  ALBUMIN 2.4*  2.3*   No results found for this basename: LIPASE, AMYLASE,  in the last 168 hours No results found for this basename: AMMONIA,  in the last 168 hours CBC:  Recent Labs Lab 04/07/13 2006 04/07/13 2010 04/08/13 0230  WBC 20.2*  --  18.9*  NEUTROABS 16.4*  --   --   HGB 12.7 13.6 11.9*  HCT 34.6* 40.0 32.7*  MCV 81.6  --  83.4  PLT 539*  --  516*   Cardiac Enzymes:  Recent Labs Lab 04/08/13 0230  CKTOTAL 1567*   BNP (last 3 results) No results found for this basename: PROBNP,  in the last 8760 hours CBG:  Recent Labs Lab 04/08/13 0155 04/08/13 0729 04/08/13 1113  GLUCAP 164* 128* 208*    No results found for this or any previous visit (from the past 240 hour(s)).   Studies: Dg Chest 2 View  04/07/2013  CLINICAL DATA:  Fever  EXAM: CHEST  2 VIEW  COMPARISON:  None.  FINDINGS: The heart size and mediastinal contours are within normal limits. Both lungs are clear. The visualized skeletal structures are unremarkable.  IMPRESSION: No active cardiopulmonary disease.   Electronically Signed   By: Skipper Cliche M.D.   On: 04/07/2013 22:32   Dg Knee Complete 4 Views Left  04/08/2013   CLINICAL DATA:  Left anterior knee pain and swelling for 4 days.  EXAM: LEFT KNEE - COMPLETE 4+ VIEW  COMPARISON:  None.  FINDINGS: There is no evidence of fracture or dislocation. The joint spaces are preserved. No significant degenerative change is seen; the patellofemoral joint is grossly unremarkable in appearance.  A moderate knee joint effusion is seen. The  visualized soft tissues are otherwise unremarkable in appearance.  IMPRESSION: 1. No evidence of fracture or dislocation. 2. Moderate joint effusion noted.   Electronically Signed   By: Garald Balding M.D.   On: 04/08/2013 01:12   Dg Knee Complete 4 Views Right  04/08/2013   CLINICAL DATA:  Anterior knee pain and swelling for 4 days.  EXAM: RIGHT KNEE - COMPLETE 4+ VIEW  COMPARISON:  None.  FINDINGS: No acute fracture or dislocation is identified. Well corticated osseous densities posterior to the tibia are chronic in nature. Joint spaces are preserved. No significant joint effusion appreciated. Mild degenerative spurring is seen within the intercondylar eminences and superior pole of the patella.  IMPRESSION: 1. No acute osseous abnormality about the knee. 2. Moderate degenerative spurring at the intercondylar eminences and superior pole of the patella. .   Electronically Signed   By: Jeannine Boga M.D.   On: 04/08/2013 01:15    Scheduled Meds: . cefTRIAXone (ROCEPHIN)  IV  1 g Intravenous Q24H  . gabapentin  600 mg Oral QHS  . heparin  5,000 Units Subcutaneous Q8H  . insulin aspart  0-15 Units Subcutaneous TID WC  . insulin aspart  0-5 Units Subcutaneous QHS  . vancomycin (VANCOCIN) 750 mg IVPB  750 mg Intravenous Q24H   Continuous Infusions: . sodium chloride 75 mL/hr at 04/08/13 0201      Time spent: 25 minutes    [name], [facility] Pager [phone] If 7PM-7AM, please contact night-coverage at [email or web address], password [facility] 04/08/2013, 1:44 PM  LOS: 1 day

## 2013-04-08 NOTE — H&P (Signed)
[facility] History and Physical  Patient: Sarah Colon  [id number]  DOB: Oct 10, 1945  DOS: 04/08/2013 PCP: Salena Saner., MD  Chief Complaint: Bilateral leg pain  HPI: Sarah Colon is a 67 y.o. female with Past medical history of arthritis, hypertension, diabetes, skin grafting. The patient is coming from home. She presents today with the complaint of generalized malaise and knee pain. She mentions that she has chronic knee pain and has been evaluated by her PCP as an outpatient was diagnosed with arthritis and was treated with tramadol recently. But her pain continued to get worse and since last 3 days she has been having worsening pain in her knee bilaterally more on the left than on the right associated with difficulty ambulating. And therefore she called 911 today She also complains of shoulder pain which is relieved right now as well as neck pain. she initially had some headache also which is resolved right now. She denies any complaint of fever, chills, nausea vomiting, diarrhea, abdominal pain, burning urination.  Review of Systems: as mentioned in the history of present illness.  A Comprehensive review of the other systems is negative.  Past Medical History  Diagnosis Date  . Arthritis   . Hypertension   . Diabetes mellitus without complication    Past Surgical History  Procedure Laterality Date  . Skin grafts     Social History:  reports that she has quit smoking. She does not have any smokeless tobacco history on file. She reports that she does not drink alcohol or use illicit drugs. Independent for most of her  ADL.  Allergies  Allergen Reactions  . Codeine Other (See Comments)    Causes shaking    History reviewed. No pertinent family history.  Prior to Admission medications   Medication Sig Start Date End Date Taking? Authorizing Provider  traMADol (ULTRAM) 50 MG tablet Take 50 mg by mouth 3 (three) times daily as needed (pain).  03/28/13   Yes Historical Provider, MD  ALPHAGAN P 0.1 % SOLN  01/08/13   Historical Provider, MD  amLODipine-benazepril (LOTREL) 10-20 MG per capsule  03/29/13   Historical Provider, MD  dorzolamide-timolol (COSOPT) 22.3-6.8 MG/ML ophthalmic solution  01/30/13   Historical Provider, MD  gabapentin (NEURONTIN) 600 MG tablet  03/29/13   Historical Provider, MD  lidocaine (LIDODERM) 5 %  02/10/13   Historical Provider, MD  LUMIGAN 0.01 % SOLN  03/27/13   Historical Provider, MD  meloxicam (MOBIC) 15 MG tablet  01/30/13   Historical Provider, MD  metFORMIN (GLUCOPHAGE-XR) 500 MG 24 hr tablet  03/29/13   Historical Provider, MD  sulfamethoxazole-trimethoprim (BACTRIM DS) 800-160 MG per tablet  02/05/13   Historical Provider, MD  valsartan-hydrochlorothiazide (DIOVAN-HCT) 320-25 MG per tablet  03/29/13   Historical Provider, MD  VICODIN 5-300 MG TABS  02/05/13   Historical Provider, MD    Physical Exam: Filed Vitals:   04/07/13 2245 04/07/13 2330 04/08/13 0000 04/08/13 0147  BP: 118/105 148/69 114/84 119/71  Pulse: 98 117 104 99  Temp:    99.2 F (37.3 C)  TempSrc:    Oral  Resp:    18  Height:      Weight:    53.4 kg (117 lb 11.6 oz)  SpO2: 96% 98% 98% 95%    General: Alert, Awake and Oriented to Time, Place and Person. Appear in mild distress Eyes: PERRL ENT: Oral Mucosa clear dry. Neck: no JVD Cardiovascular: S1 and S2 Present, no Murmur, Peripheral Pulses Present Respiratory: Bilateral  Air entry equal and Decreased, Clear to Auscultation,  no Crackles,no wheezes Abdomen: Bowel Sound Present, Soft and Non tender Skin: no Rash Extremities: Bilateral Pedal edema, left knee swollen and red, warm to touch, range of motion limited bilaterally, no calf tenderness Neurologic: Grossly Unremarkable.  Labs on Admission:  CBC:  Recent Labs Lab 04/07/13 2006 04/07/13 2010  WBC 20.2*  --   NEUTROABS 16.4*  --   HGB 12.7 13.6  HCT 34.6* 40.0  MCV 81.6  --   PLT 539*  --     CMP     Component  Value Date/Time   NA 138 04/08/2013 0230   NA 136 04/08/2013 0230   K 3.4* 04/08/2013 0230   K 3.4* 04/08/2013 0230   CL 99 04/08/2013 0230   CL 98 04/08/2013 0230   CO2 26 04/08/2013 0230   CO2 26 04/08/2013 0230   GLUCOSE 157* 04/08/2013 0230   GLUCOSE 159* 04/08/2013 0230   BUN 35* 04/08/2013 0230   BUN 34* 04/08/2013 0230   CREATININE 1.17* 04/08/2013 0230   CREATININE 1.16* 04/08/2013 0230   CALCIUM 9.3 04/08/2013 0230   CALCIUM 9.1 04/08/2013 0230   PROT 7.4 04/08/2013 0230   PROT 7.3 04/08/2013 0230   ALBUMIN 2.4* 04/08/2013 0230   ALBUMIN 2.3* 04/08/2013 0230   AST 139* 04/08/2013 0230   AST 137* 04/08/2013 0230   ALT 165* 04/08/2013 0230   ALT 163* 04/08/2013 0230   ALKPHOS 190* 04/08/2013 0230   ALKPHOS 193* 04/08/2013 0230   BILITOT 0.8 04/08/2013 0230   BILITOT 0.8 04/08/2013 0230   GFRNONAA 46* 04/08/2013 0230   GFRNONAA 47* 04/08/2013 0230   GFRAA 54* 04/08/2013 0230   GFRAA 54* 04/08/2013 0230    No results found for this basename: LIPASE, AMYLASE,  in the last 168 hours No results found for this basename: AMMONIA,  in the last 168 hours   Recent Labs Lab 04/08/13 0230  CKTOTAL 1567*   BNP (last 3 results) No results found for this basename: PROBNP,  in the last 8760 hours  Radiological Exams on Admission: Dg Chest 2 View  04/07/2013   CLINICAL DATA:  Fever  EXAM: CHEST  2 VIEW  COMPARISON:  None.  FINDINGS: The heart size and mediastinal contours are within normal limits. Both lungs are clear. The visualized skeletal structures are unremarkable.  IMPRESSION: No active cardiopulmonary disease.   Electronically Signed   By: Skipper Cliche M.D.   On: 04/07/2013 22:32   Dg Knee Complete 4 Views Left  04/08/2013   CLINICAL DATA:  Left anterior knee pain and swelling for 4 days.  EXAM: LEFT KNEE - COMPLETE 4+ VIEW  COMPARISON:  None.  FINDINGS: There is no evidence of fracture or dislocation. The joint spaces are preserved. No significant degenerative change is seen; the patellofemoral  joint is grossly unremarkable in appearance.  A moderate knee joint effusion is seen. The visualized soft tissues are otherwise unremarkable in appearance.  IMPRESSION: 1. No evidence of fracture or dislocation. 2. Moderate joint effusion noted.   Electronically Signed   By: Garald Balding M.D.   On: 04/08/2013 01:12   Dg Knee Complete 4 Views Right  04/08/2013   CLINICAL DATA:  Anterior knee pain and swelling for 4 days.  EXAM: RIGHT KNEE - COMPLETE 4+ VIEW  COMPARISON:  None.  FINDINGS: No acute fracture or dislocation is identified. Well corticated osseous densities posterior to the tibia are chronic in nature. Joint spaces are preserved. No  significant joint effusion appreciated. Mild degenerative spurring is seen within the intercondylar eminences and superior pole of the patella.  IMPRESSION: 1. No acute osseous abnormality about the knee. 2. Moderate degenerative spurring at the intercondylar eminences and superior pole of the patella. .   Electronically Signed   By: Jeannine Boga M.D.   On: 04/08/2013 01:15    Assessment/Plan Principal Problem:   Knee pain Active Problems:   Diabetes   1. Knee pain The patient appear to have possible cellulitis of the left leg with possible involvement of knee. Her x-ray of the knee does not show any signs of destruction secondary to infection. She has leukocytosis hypotension and tachycardia which qualifies her for sepsis. Blood cultures are drawn urine is negative chest x-ray is within normal limits IV fluids will be given, IV antibiotics with IV ceftriaxone will be given, if her condition worsens she may require orthopedic consult for arthrocentesis.  2. diabetes mellitus Placed on sliding scale  3. Pain management Dilaudid and Neurontin  4. Elevated ESR The other possibility of her knee pain is inflammatory arthritis. Workup is started DVT Prophylaxis: subcutaneous Heparin Nutrition:  cardiac and diabetic diet   Code Status:  full   Disposition: Admitted to inpatient in telemetry unit.  Author: Berle Mull, MD [facility] Pager: [phone] 04/08/2013, 5:31 AM    If 7PM-7AM, please contact night-coverage [email or web address] Password [email or web address]

## 2013-04-08 NOTE — Consult Note (Addendum)
[place] for :  Vancomycin, Ceftriaxone Indication:  L-leg cellulitis, Septic knee  Hospital Problems Principal Problem:   Knee pain Active Problems:   Diabetes   Weight: 53 kg  Vitals: BP 136/64  Pulse 99  Temp(Src) 98.4 F (36.9 C) (Oral)  Resp 17  Ht 4\' 11"  (1.499 m)  Wt 117 lb 11.6 oz (53.4 kg)  BMI 23.77 kg/m2  SpO2 100%  Labs:  Recent Labs  04/07/13 2006 04/07/13 2010 04/08/13 0230  WBC 20.2*  --  18.9*  HGB 12.7 13.6 11.9*  PLT 539*  --  516*  CREATININE  --  1.60* 1.17*  1.16*   Estimated Creatinine Clearance: 33.9 ml/min (by C-G formula based on Cr of 1.17).   Microbiology: No results found for this or any previous visit (from the past 720 hour(s)).  Anti-infectives Anti-infectives   Start     Dose/Rate Route Frequency Ordered Stop   04/08/13 2200  cefTRIAXone (ROCEPHIN) 1 g in dextrose 5 % 50 mL IVPB     1 g 100 mL/hr over 30 Minutes Intravenous Every 24 hours 04/08/13 0153     04/07/13 2300  cefTRIAXone (ROCEPHIN) 1 g in dextrose 5 % 50 mL IVPB     1 g 100 mL/hr over 30 Minutes Intravenous  Once 04/07/13 2249 04/07/13 2359      Assessment:  67 y.o. female with Past medical history of arthritis, hypertension, and diabetes admitted for treatment of L-leg cellulitis/septic knee.  Patient previously started on Ceftriaxone and now Vancomycin to be added for broader coverage.  CrCl 34 ml/min.  Afebrile. WBC 18.9 [20.2].   Goal of Therapy:   Vancomycin trough level 10-15 mcg/ml Antibiotics selected for infection/cultures and adjusted as clinically indicated.   Plan:   Continue Ceftriaxone 1 gm IV q 24 hours.  Begin Vancomycin 750 mg IV q 24 hours. Follow up SCr, UOP, cultures, clinical course and adjust as clinically indicated.  Vancomycin levels as indicated.  Estelle June, Pharm.D.  04/08/2013 11:17 AM  Addendum: Pharmacy asked to change ceftriaxone to cefepime.  Will give 1g q 24 hrs based  on renal function.  Uvaldo Rising, BCPS  Clinical Pharmacist Pager [phone]  04/08/2013 3:32 PM

## 2013-04-08 NOTE — Progress Notes (Signed)
New Admission Note:   Arrival Method: Via stretcher on telemetry with EMT from ED  Mental Orientation: A+O x4  Telemetry: No admission orders for telemetry  Assessment: Completed with no significant finds  Skin: Blisters on bottom that appear to be stage 2. One blister as opened area. Area around [place] is excoriated (will apply EPC), looks like it could be a possible Deep Tissue issue. Will consult a second opinion. Reddened areas on feet from shoes, blanchable and warm to touch  IV: Clean, dry and intact. NSL at the time   Pain: 4 out of 10 bilateral knees  Tubes: None  Safety Measures: Fall Safety Plan Prevention sheet has been given and discussed  Admission: Completed  6700 Orientation: Oriented to the unit, room and the staff  Family: No family at the bedside  Orders have been reviewed and implemented. Will continue to monitor the patient. Call light has been placed within reach and bed alarm has been activated.   Maggie Font, RN  Phone number: [phone]

## 2013-04-08 NOTE — Progress Notes (Signed)
Apparent acute crystal arthropathy. Agree with plan and will follow progress

## 2013-04-08 NOTE — Consult Note (Signed)
.km Reason for Consult:knee and leg pain Referring Physician: Dhungel  HPI: Sarah Colon is an 67 y.o. female presents with the complaint of generalized malaise and knee pain. She mentions that she has chronic knee pain and has been evaluated by her PCP as an outpatient was diagnosed with arthritis and was treated with tramadol recently. But her pain continued to get worse and since last 3 days she has been having worsening pain in her knee bilaterally more on the left than on the right associated with difficulty ambulating. Reports that last week both arms became very painful and stiff but this has now resolved.   Past Medical History  Diagnosis Date  . Arthritis   . Hypertension   . Diabetes mellitus without complication     Past Surgical History  Procedure Laterality Date  . Skin grafts      History reviewed. No pertinent family history.  Social History:  reports that she has quit smoking. She does not have any smokeless tobacco history on file. She reports that she does not drink alcohol or use illicit drugs.  Allergies:  Allergies  Allergen Reactions  . Codeine Other (See Comments)    Causes shaking    Medications: I have reviewed the patient's current medications.  Results for orders placed during the hospital encounter of 04/07/13 (from the past 48 hour(s))  CBC WITH DIFFERENTIAL     Status: Abnormal   Collection Time    04/07/13  8:06 PM      Result Value Range   WBC 20.2 (*) 4.0 - 10.5 K/uL   RBC 4.24  3.87 - 5.11 MIL/uL   Hemoglobin 12.7  12.0 - 15.0 g/dL   HCT 34.6 (*) 36.0 - 46.0 %   MCV 81.6  78.0 - 100.0 fL   MCH 30.0  26.0 - 34.0 pg   MCHC 36.7 (*) 30.0 - 36.0 g/dL   RDW 13.7  11.5 - 15.5 %   Platelets 539 (*) 150 - 400 K/uL   Neutrophils Relative % 81 (*) 43 - 77 %   Neutro Abs 16.4 (*) 1.7 - 7.7 K/uL   Lymphocytes Relative 8 (*) 12 - 46 %   Lymphs Abs 1.6  0.7 - 4.0 K/uL   Monocytes Relative 10  3 - 12 %   Monocytes Absolute 2.1 (*) 0.1 - 1.0  K/uL   Eosinophils Relative 0  0 - 5 %   Eosinophils Absolute 0.1  0.0 - 0.7 K/uL   Basophils Relative 0  0 - 1 %   Basophils Absolute 0.0  0.0 - 0.1 K/uL  SEDIMENTATION RATE     Status: Abnormal   Collection Time    04/07/13  8:06 PM      Result Value Range   Sed Rate 116 (*) 0 - 22 mm/hr  C-REACTIVE PROTEIN     Status: Abnormal   Collection Time    04/07/13  8:06 PM      Result Value Range   CRP 52.8 (*) <0.60 mg/dL   Comment: Result confirmed by automatic dilution.     Performed at [facility], [place] [address]     Status: Abnormal   Collection Time    04/07/13  8:10 PM      Result Value Range   Sodium 140  135 - 145 mEq/L   Potassium 4.0  3.5 - 5.1 mEq/L   Chloride 101  96 - 112 mEq/L   BUN 35 (*) 6 - 23  mg/dL   Creatinine, Ser 1.60 (*) 0.50 - 1.10 mg/dL   Glucose, Bld 116 (*) 70 - 99 mg/dL   Calcium, Ion 1.18  1.13 - 1.30 mmol/L   TCO2 24  0 - 100 mmol/L   Hemoglobin 13.6  12.0 - 15.0 g/dL   HCT 40.0  36.0 - 46.0 %  URINALYSIS, ROUTINE W REFLEX MICROSCOPIC     Status: Abnormal   Collection Time    04/07/13  9:45 PM      Result Value Range   Color, Urine YELLOW  YELLOW   APPearance CLEAR  CLEAR   Specific Gravity, Urine 1.013  1.005 - 1.030   pH 5.0  5.0 - 8.0   Glucose, UA NEGATIVE  NEGATIVE mg/dL   Hgb urine dipstick TRACE (*) NEGATIVE   Bilirubin Urine NEGATIVE  NEGATIVE   Ketones, ur NEGATIVE  NEGATIVE mg/dL   Protein, ur NEGATIVE  NEGATIVE mg/dL   Urobilinogen, UA 1.0  0.0 - 1.0 mg/dL   Nitrite NEGATIVE  NEGATIVE   Leukocytes, UA NEGATIVE  NEGATIVE  URINE MICROSCOPIC-ADD ON     Status: None   Collection Time    04/07/13  9:45 PM      Result Value Range   Squamous Epithelial / LPF RARE  RARE   WBC, UA 0-2  <3 WBC/hpf   RBC / HPF 0-2  <3 RBC/hpf   Bacteria, UA RARE  RARE   Urine-Other AMORPHOUS URATES/PHOSPHATES     Comment: MUCOUS PRESENT  GLUCOSE, CAPILLARY     Status: Abnormal   Collection Time    04/08/13  1:55 AM      Result Value  Range   Glucose-Capillary 164 (*) 70 - 99 mg/dL  CK     Status: Abnormal   Collection Time    04/08/13  2:30 AM      Result Value Range   Total CK 1567 (*) 7 - 177 U/L  COMPREHENSIVE METABOLIC PANEL     Status: Abnormal   Collection Time    04/08/13  2:30 AM      Result Value Range   Sodium 138  135 - 145 mEq/L   Potassium 3.4 (*) 3.5 - 5.1 mEq/L   Chloride 99  96 - 112 mEq/L   CO2 26  19 - 32 mEq/L   Glucose, Bld 157 (*) 70 - 99 mg/dL   BUN 35 (*) 6 - 23 mg/dL   Creatinine, Ser 1.17 (*) 0.50 - 1.10 mg/dL   Calcium 9.3  8.4 - 10.5 mg/dL   Total Protein 7.4  6.0 - 8.3 g/dL   Albumin 2.4 (*) 3.5 - 5.2 g/dL   AST 139 (*) 0 - 37 U/L   ALT 165 (*) 0 - 35 U/L   Alkaline Phosphatase 190 (*) 39 - 117 U/L   Total Bilirubin 0.8  0.3 - 1.2 mg/dL   GFR calc non Af Amer 46 (*) >90 mL/min   GFR calc Af Amer 54 (*) >90 mL/min   Comment: (NOTE)     The eGFR has been calculated using the CKD EPI equation.     This calculation has not been validated in all clinical situations.     eGFR's persistently <90 mL/min signify possible Chronic Kidney     Disease.  COMPREHENSIVE METABOLIC PANEL     Status: Abnormal   Collection Time    04/08/13  2:30 AM      Result Value Range   Sodium 136  135 - 145 mEq/L  Potassium 3.4 (*) 3.5 - 5.1 mEq/L   Chloride 98  96 - 112 mEq/L   CO2 26  19 - 32 mEq/L   Glucose, Bld 159 (*) 70 - 99 mg/dL   BUN 34 (*) 6 - 23 mg/dL   Creatinine, Ser 1.16 (*) 0.50 - 1.10 mg/dL   Calcium 9.1  8.4 - 10.5 mg/dL   Total Protein 7.3  6.0 - 8.3 g/dL   Albumin 2.3 (*) 3.5 - 5.2 g/dL   AST 137 (*) 0 - 37 U/L   ALT 163 (*) 0 - 35 U/L   Alkaline Phosphatase 193 (*) 39 - 117 U/L   Total Bilirubin 0.8  0.3 - 1.2 mg/dL   GFR calc non Af Amer 47 (*) >90 mL/min   GFR calc Af Amer 54 (*) >90 mL/min   Comment: (NOTE)     The eGFR has been calculated using the CKD EPI equation.     This calculation has not been validated in all clinical situations.     eGFR's persistently <90 mL/min  signify possible Chronic Kidney     Disease.  CBC     Status: Abnormal   Collection Time    04/08/13  2:30 AM      Result Value Range   WBC 18.9 (*) 4.0 - 10.5 K/uL   RBC 3.92  3.87 - 5.11 MIL/uL   Hemoglobin 11.9 (*) 12.0 - 15.0 g/dL   HCT 32.7 (*) 36.0 - 46.0 %   MCV 83.4  78.0 - 100.0 fL   MCH 30.4  26.0 - 34.0 pg   MCHC 36.4 (*) 30.0 - 36.0 g/dL   RDW 14.3  11.5 - 15.5 %   Platelets 516 (*) 150 - 400 K/uL  PROTIME-INR     Status: None   Collection Time    04/08/13  2:30 AM      Result Value Range   Prothrombin Time 15.2  11.6 - 15.2 seconds   INR 1.23  0.00 - 1.49  URIC ACID     Status: Abnormal   Collection Time    04/08/13  2:30 AM      Result Value Range   Uric Acid, Serum 9.9 (*) 2.4 - 7.0 mg/dL  GLUCOSE, CAPILLARY     Status: Abnormal   Collection Time    04/08/13  7:29 AM      Result Value Range   Glucose-Capillary 128 (*) 70 - 99 mg/dL  GLUCOSE, CAPILLARY     Status: Abnormal   Collection Time    04/08/13 11:13 AM      Result Value Range   Glucose-Capillary 208 (*) 70 - 99 mg/dL    Dg Chest 2 View  04/07/2013   CLINICAL DATA:  Fever  EXAM: CHEST  2 VIEW  COMPARISON:  None.  FINDINGS: The heart size and mediastinal contours are within normal limits. Both lungs are clear. The visualized skeletal structures are unremarkable.  IMPRESSION: No active cardiopulmonary disease.   Electronically Signed   By: Skipper Cliche M.D.   On: 04/07/2013 22:32   Dg Knee Complete 4 Views Left  04/08/2013   CLINICAL DATA:  Left anterior knee pain and swelling for 4 days.  EXAM: LEFT KNEE - COMPLETE 4+ VIEW  COMPARISON:  None.  FINDINGS: There is no evidence of fracture or dislocation. The joint spaces are preserved. No significant degenerative change is seen; the patellofemoral joint is grossly unremarkable in appearance.  A moderate knee joint effusion is seen. The visualized  soft tissues are otherwise unremarkable in appearance.  IMPRESSION: 1. No evidence of fracture or  dislocation. 2. Moderate joint effusion noted.   Electronically Signed   By: Garald Balding M.D.   On: 04/08/2013 01:12   Dg Knee Complete 4 Views Right  04/08/2013   CLINICAL DATA:  Anterior knee pain and swelling for 4 days.  EXAM: RIGHT KNEE - COMPLETE 4+ VIEW  COMPARISON:  None.  FINDINGS: No acute fracture or dislocation is identified. Well corticated osseous densities posterior to the tibia are chronic in nature. Joint spaces are preserved. No significant joint effusion appreciated. Mild degenerative spurring is seen within the intercondylar eminences and superior pole of the patella.  IMPRESSION: 1. No acute osseous abnormality about the knee. 2. Moderate degenerative spurring at the intercondylar eminences and superior pole of the patella. .   Electronically Signed   By: Jeannine Boga M.D.   On: 04/08/2013 01:15    ROS: as per admitting H and P Physical Exam: thin BF, a and O, neck Ethleen Lormand, non tender about shoulders and arms, good grip strength, chest and abdomen non-tender. Hip non tender. Left knee with moderate effusion, painful motion but no instability, right knee minimally tender, no effusion. Both feet diffusely swollen and very tender to palpation, no erythema or fluctuance.  Left knee aspirated yielding easily 20cc slightly cloudy, yellow synovial fluid, no gross purulence, sent for cx and joint fluid analysis Vitals Temp:  [98.4 F (36.9 C)-101.2 F (38.4 C)] 98.4 F (36.9 C) (11/09 0958) Pulse Rate:  [95-117] 99 (11/09 0958) Resp:  [17-19] 17 (11/09 0958) BP: (114-158)/(60-105) 136/64 mmHg (11/09 0958) SpO2:  [94 %-100 %] 100 % (11/09 0958) Weight:  [53.4 kg (117 lb 11.6 oz)-56.246 kg (124 lb)] 53.4 kg (117 lb 11.6 oz) (11/09 0147) Body mass index is 23.77 kg/(m^2).  Assessment/Plan: Impression: leucocytosis and bilateral LE pain, left knee effusion and bilateral feet swelling. Septicemia? Septic arthritis vs crystal arthropathy? Plan: Will f/u on results of fluid  analysis and Cx.  [facility] for Dr. Justice Britain 04/08/2013, 12:58 PM

## 2013-04-08 NOTE — Progress Notes (Signed)
67yo female c/o knee pain not relieved by tramadol, to begin IV ABX for cellulitis with possible joint involvement.  Will start Rocephin 1g IV Q24H.  Wynona Neat, PharmD, BCPS 04/08/2013 1:54 AM

## 2013-04-08 NOTE — Progress Notes (Signed)
Synovial fluid analysis reviewed , 12k wbc with MSU crystals noted. patient has polyarticular jt inflammation. Elevated uric acid. 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  On empiric vanco and rocephin. Elevated ESR and CRP. Also noted for transaminitis. Ordered hepatitis panel and RUQ ultrasound. Will switch rocephin to cefepime. Added colchicine.  Follow cx Will obtain a 2D echo to r/o vegetation.  will call ID consult  if symptoms not improved

## 2013-04-09 DIAGNOSIS — B9689 Other specified bacterial agents as the cause of diseases classified elsewhere: Secondary | ICD-10-CM

## 2013-04-09 DIAGNOSIS — R7989 Other specified abnormal findings of blood chemistry: Secondary | ICD-10-CM

## 2013-04-09 DIAGNOSIS — E119 Type 2 diabetes mellitus without complications: Secondary | ICD-10-CM

## 2013-04-09 DIAGNOSIS — R944 Abnormal results of kidney function studies: Secondary | ICD-10-CM

## 2013-04-09 DIAGNOSIS — D72829 Elevated white blood cell count, unspecified: Secondary | ICD-10-CM

## 2013-04-09 DIAGNOSIS — N179 Acute kidney failure, unspecified: Secondary | ICD-10-CM

## 2013-04-09 DIAGNOSIS — R7881 Bacteremia: Secondary | ICD-10-CM

## 2013-04-09 DIAGNOSIS — E860 Dehydration: Secondary | ICD-10-CM

## 2013-04-09 DIAGNOSIS — Z113 Encounter for screening for infections with a predominantly sexual mode of transmission: Secondary | ICD-10-CM

## 2013-04-09 DIAGNOSIS — M109 Gout, unspecified: Secondary | ICD-10-CM

## 2013-04-09 LAB — GLUCOSE, CAPILLARY
Glucose-Capillary: 114 mg/dL — ABNORMAL HIGH (ref 70–99)
Glucose-Capillary: 145 mg/dL — ABNORMAL HIGH (ref 70–99)

## 2013-04-09 LAB — CBC
Hemoglobin: 11.5 g/dL — ABNORMAL LOW (ref 12.0–15.0)
MCH: 29.7 pg (ref 26.0–34.0)
MCHC: 36.4 g/dL — ABNORMAL HIGH (ref 30.0–36.0)
MCV: 81.7 fL (ref 78.0–100.0)
Platelets: 572 10*3/uL — ABNORMAL HIGH (ref 150–400)
RBC: 3.87 MIL/uL (ref 3.87–5.11)
RDW: 13.9 % (ref 11.5–15.5)
WBC: 18.2 10*3/uL — ABNORMAL HIGH (ref 4.0–10.5)

## 2013-04-09 LAB — COMPREHENSIVE METABOLIC PANEL
ALT: 264 U/L — ABNORMAL HIGH (ref 0–35)
AST: 179 U/L — ABNORMAL HIGH (ref 0–37)
BUN: 24 mg/dL — ABNORMAL HIGH (ref 6–23)
CO2: 22 mEq/L (ref 19–32)
Calcium: 9 mg/dL (ref 8.4–10.5)
Chloride: 108 mEq/L (ref 96–112)
GFR calc Af Amer: 68 mL/min — ABNORMAL LOW (ref >90)
GFR calc non Af Amer: 58 mL/min — ABNORMAL LOW (ref >90)
Glucose, Bld: 126 mg/dL — ABNORMAL HIGH (ref 70–99)
Sodium: 142 mEq/L (ref 135–145)
Total Bilirubin: 0.6 mg/dL (ref 0.3–1.2)
Total Protein: 7.1 g/dL (ref 6.0–8.3)

## 2013-04-09 LAB — ANA: Anti Nuclear Antibody(ANA): NEGATIVE

## 2013-04-09 MED ORDER — NAPROXEN 500 MG PO TABS
500.0000 mg | ORAL_TABLET | Freq: Two times a day (BID) | ORAL | Status: DC
  Administered 2013-04-09 – 2013-04-14 (×10): 500 mg via ORAL
  Filled 2013-04-09 (×14): qty 1

## 2013-04-09 MED ORDER — GLUCERNA SHAKE PO LIQD
237.0000 mL | Freq: Three times a day (TID) | ORAL | Status: DC
  Administered 2013-04-09 – 2013-04-17 (×22): 237 mL via ORAL

## 2013-04-09 NOTE — Consult Note (Addendum)
[facility]    Date of Admission:  04/07/2013  Date of Consult:  04/09/2013  Reason for Consult: ? Bacteremia ? Septic arthritis Referring Physician: Dr. Clementeen Graham   HPI: Sarah Colon is an 67 y.o. female. With PMHX of arthritis, HTN, DM, came in with complaint of generalized malaise and knee pain. She called 911 and came in  She also complains of shoulder pain which is relieved right now as well as neck pain. she initially had some headache also which is resolved right now. She denies any complaint of fever, chills, nausea vomiting, diarrhea, abdominal pain.  Blood cultures were obtained and have grown GP cocci and gram negative cocco-bacilli in 1/2 admission cultures. She had aspiration of the knee which showed 12400 wbc, pmn predominant with INTRACELLULAR MONOSODIUM URATE CRYSTALS EXTRACELLULAR MONOSODIUM URATE CRYSTALS.   On admission she was also in ARF with high CPK value. She has been on vancomycin, cefepime and colchicine for gout.     Past Medical History  Diagnosis Date  . Arthritis   . Hypertension   . Diabetes mellitus without complication     Past Surgical History  Procedure Laterality Date  . Skin grafts    ergies:   Allergies  Allergen Reactions  . Codeine Other (See Comments)    Causes shaking     Medications: I have reviewed patients current medications as documented in Epic Anti-infectives   Start     Dose/Rate Route Frequency Ordered Stop   04/08/13 2200  cefTRIAXone (ROCEPHIN) 1 g in dextrose 5 % 50 mL IVPB  Status:  Discontinued     1 g 100 mL/hr over 30 Minutes Intravenous Every 24 hours 04/08/13 0153 04/08/13 1526   04/08/13 1600  ceFEPIme (MAXIPIME) 1 g in dextrose 5 % 50 mL IVPB     1 g 100 mL/hr over 30 Minutes Intravenous Every 24 hours 04/08/13 1531     04/08/13 1130  vancomycin (VANCOCIN) 750 mg in sodium chloride 0.9 % 150 mL IVPB     750 mg 150 mL/hr over 60 Minutes Intravenous Every 24 hours 04/08/13  1123     04/07/13 2300  cefTRIAXone (ROCEPHIN) 1 g in dextrose 5 % 50 mL IVPB     1 g 100 mL/hr over 30 Minutes Intravenous  Once 04/07/13 2249 04/07/13 2359      Social History:  reports that she has quit smoking. She does not have any smokeless tobacco history on file. She reports that she does not drink alcohol or use illicit drugs.  History reviewed. No pertinent family history.  As in HPI and primary teams notes otherwise 12 point review of systems is negative  Blood pressure 143/69, pulse 87, temperature 98.9 F (37.2 C), temperature source Oral, resp. rate 16, height 4\' 11"  (1.499 m), weight 124 lb 1.9 oz (56.3 kg), SpO2 98.00%. General: Alert and awake, oriented x3, not in any acute distress. HEENT: anicteric sclera, pupils reactive to light and accommodation, EOMI, oropharynx clear and without exudate CVS regular rate, normal r,  no murmur rubs or gallops Chest: clear to auscultation bilaterally, no wheezing, rales or rhonchi Abdomen: soft nontender, nondistended, normal bowel sounds, Extremities: bilateral knees are warm and tender to palpation with slight effusions.  Ankles also tender to palpation wrists elbows nontender  Neuro: nonfocal, strength and sensation intact   Results for orders placed during the hospital encounter of 04/07/13 (from the past 48 hour(s))  CBC WITH DIFFERENTIAL     Status:  Abnormal   Collection Time    04/07/13  8:06 PM      Result Value Range   WBC 20.2 (*) 4.0 - 10.5 K/uL   RBC 4.24  3.87 - 5.11 MIL/uL   Hemoglobin 12.7  12.0 - 15.0 g/dL   HCT 34.6 (*) 36.0 - 46.0 %   MCV 81.6  78.0 - 100.0 fL   MCH 30.0  26.0 - 34.0 pg   MCHC 36.7 (*) 30.0 - 36.0 g/dL   RDW 13.7  11.5 - 15.5 %   Platelets 539 (*) 150 - 400 K/uL   Neutrophils Relative % 81 (*) 43 - 77 %   Neutro Abs 16.4 (*) 1.7 - 7.7 K/uL   Lymphocytes Relative 8 (*) 12 - 46 %   Lymphs Abs 1.6  0.7 - 4.0 K/uL   Monocytes Relative 10  3 - 12 %   Monocytes Absolute 2.1 (*) 0.1 - 1.0  K/uL   Eosinophils Relative 0  0 - 5 %   Eosinophils Absolute 0.1  0.0 - 0.7 K/uL   Basophils Relative 0  0 - 1 %   Basophils Absolute 0.0  0.0 - 0.1 K/uL  SEDIMENTATION RATE     Status: Abnormal   Collection Time    04/07/13  8:06 PM      Result Value Range   Sed Rate 116 (*) 0 - 22 mm/hr  C-REACTIVE PROTEIN     Status: Abnormal   Collection Time    04/07/13  8:06 PM      Result Value Range   CRP 52.8 (*) <0.60 mg/dL   Comment: Result confirmed by automatic dilution.     Performed at [place], [place] [address]     Status: Abnormal   Collection Time    04/07/13  8:10 PM      Result Value Range   Sodium 140  135 - 145 mEq/L   Potassium 4.0  3.5 - 5.1 mEq/L   Chloride 101  96 - 112 mEq/L   BUN 35 (*) 6 - 23 mg/dL   Creatinine, Ser 1.60 (*) 0.50 - 1.10 mg/dL   Glucose, Bld 116 (*) 70 - 99 mg/dL   Calcium, Ion 1.18  1.13 - 1.30 mmol/L   TCO2 24  0 - 100 mmol/L   Hemoglobin 13.6  12.0 - 15.0 g/dL   HCT 40.0  36.0 - 46.0 %  URINALYSIS, ROUTINE W REFLEX MICROSCOPIC     Status: Abnormal   Collection Time    04/07/13  9:45 PM      Result Value Range   Color, Urine YELLOW  YELLOW   APPearance CLEAR  CLEAR   Specific Gravity, Urine 1.013  1.005 - 1.030   pH 5.0  5.0 - 8.0   Glucose, UA NEGATIVE  NEGATIVE mg/dL   Hgb urine dipstick TRACE (*) NEGATIVE   Bilirubin Urine NEGATIVE  NEGATIVE   Ketones, ur NEGATIVE  NEGATIVE mg/dL   Protein, ur NEGATIVE  NEGATIVE mg/dL   Urobilinogen, UA 1.0  0.0 - 1.0 mg/dL   Nitrite NEGATIVE  NEGATIVE   Leukocytes, UA NEGATIVE  NEGATIVE  URINE MICROSCOPIC-ADD ON     Status: None   Collection Time    04/07/13  9:45 PM      Result Value Range   Squamous Epithelial / LPF RARE  RARE   WBC, UA 0-2  <3 WBC/hpf   RBC / HPF 0-2  <3 RBC/hpf   Bacteria, UA RARE  RARE   Urine-Other AMORPHOUS URATES/PHOSPHATES     Comment: MUCOUS PRESENT  CULTURE, BLOOD (ROUTINE X 2)     Status: None   Collection Time    04/07/13 11:00 PM      Result  Value Range   Specimen Description BLOOD RIGHT ARM     Special Requests BOTTLES DRAWN AEROBIC AND ANAEROBIC 10CC EACH     Culture  Setup Time       Value: 04/08/2013 13:19     Performed at [facility]   Culture       Value: [place] NEGATIVE COCCOBACILLI     Note: Gram Stain Report Called to,Read Back By and Verified With: NIKKI MURPHY 04/08/2013 [place]     Performed at [facility]   Report Status PENDING    CULTURE, BLOOD (ROUTINE X 2)     Status: None   Collection Time    04/07/13 11:04 PM      Result Value Range   Specimen Description BLOOD RIGHT HAND     Special Requests BOTTLES DRAWN AEROBIC ONLY 2CC     Culture  Setup Time       Value: 04/08/2013 13:19     Performed at [facility]   Culture       Value:        BLOOD CULTURE RECEIVED NO GROWTH TO DATE CULTURE WILL BE HELD FOR 5 DAYS BEFORE ISSUING A FINAL NEGATIVE REPORT     Performed at [facility]   Report Status PENDING    GLUCOSE, CAPILLARY     Status: Abnormal   Collection Time    04/08/13  1:55 AM      Result Value Range   Glucose-Capillary 164 (*) 70 - 99 mg/dL  CK     Status: Abnormal   Collection Time    04/08/13  2:30 AM      Result Value Range   Total CK 1567 (*) 7 - 177 U/L  COMPREHENSIVE METABOLIC PANEL     Status: Abnormal   Collection Time    04/08/13  2:30 AM      Result Value Range   Sodium 138  135 - 145 mEq/L   Potassium 3.4 (*) 3.5 - 5.1 mEq/L   Chloride 99  96 - 112 mEq/L   CO2 26  19 - 32 mEq/L   Glucose, Bld 157 (*) 70 - 99 mg/dL   BUN 35 (*) 6 - 23 mg/dL   Creatinine, Ser 1.17 (*) 0.50 - 1.10 mg/dL   Calcium 9.3  8.4 - 10.5 mg/dL   Total Protein 7.4  6.0 - 8.3 g/dL   Albumin 2.4 (*) 3.5 - 5.2 g/dL   AST 139 (*) 0 - 37 U/L   ALT 165 (*) 0 - 35 U/L   Alkaline Phosphatase 190 (*) 39 - 117 U/L   Total Bilirubin 0.8  0.3 - 1.2 mg/dL   GFR calc non Af Amer 46 (*) >90 mL/min   GFR calc Af Amer 54 (*) >90 mL/min   Comment: (NOTE)      The eGFR has been calculated using the CKD EPI equation.     This calculation has not been validated in all clinical situations.     eGFR's persistently <90 mL/min signify possible Chronic Kidney     Disease.  ANA     Status: None   Collection Time    04/08/13  2:30 AM  Result Value Range   ANA NEGATIVE  NEGATIVE   Comment: Performed at [facility]  TSH     Status: None   Collection Time    04/08/13  2:30 AM      Result Value Range   TSH 1.669  0.350 - 4.500 uIU/mL   Comment: Performed at [place]     Status: Abnormal   Collection Time    04/08/13  2:30 AM      Result Value Range   Sodium 136  135 - 145 mEq/L   Potassium 3.4 (*) 3.5 - 5.1 mEq/L   Chloride 98  96 - 112 mEq/L   CO2 26  19 - 32 mEq/L   Glucose, Bld 159 (*) 70 - 99 mg/dL   BUN 34 (*) 6 - 23 mg/dL   Creatinine, Ser 1.16 (*) 0.50 - 1.10 mg/dL   Calcium 9.1  8.4 - 10.5 mg/dL   Total Protein 7.3  6.0 - 8.3 g/dL   Albumin 2.3 (*) 3.5 - 5.2 g/dL   AST 137 (*) 0 - 37 U/L   ALT 163 (*) 0 - 35 U/L   Alkaline Phosphatase 193 (*) 39 - 117 U/L   Total Bilirubin 0.8  0.3 - 1.2 mg/dL   GFR calc non Af Amer 47 (*) >90 mL/min   GFR calc Af Amer 54 (*) >90 mL/min   Comment: (NOTE)     The eGFR has been calculated using the CKD EPI equation.     This calculation has not been validated in all clinical situations.     eGFR's persistently <90 mL/min signify possible Chronic Kidney     Disease.  CBC     Status: Abnormal   Collection Time    04/08/13  2:30 AM      Result Value Range   WBC 18.9 (*) 4.0 - 10.5 K/uL   RBC 3.92  3.87 - 5.11 MIL/uL   Hemoglobin 11.9 (*) 12.0 - 15.0 g/dL   HCT 32.7 (*) 36.0 - 46.0 %   MCV 83.4  78.0 - 100.0 fL   MCH 30.4  26.0 - 34.0 pg   MCHC 36.4 (*) 30.0 - 36.0 g/dL   RDW 14.3  11.5 - 15.5 %   Platelets 516 (*) 150 - 400 K/uL  PROTIME-INR     Status: None   Collection Time    04/08/13  2:30 AM      Result Value Range    Prothrombin Time 15.2  11.6 - 15.2 seconds   INR 1.23  0.00 - 1.49  URIC ACID     Status: Abnormal   Collection Time    04/08/13  2:30 AM      Result Value Range   Uric Acid, Serum 9.9 (*) 2.4 - 7.0 mg/dL  GLUCOSE, CAPILLARY     Status: Abnormal   Collection Time    04/08/13  7:29 AM      Result Value Range   Glucose-Capillary 128 (*) 70 - 99 mg/dL  GLUCOSE, CAPILLARY     Status: Abnormal   Collection Time    04/08/13 11:13 AM      Result Value Range   Glucose-Capillary 208 (*) 70 - 99 mg/dL  BODY FLUID CULTURE     Status: None   Collection Time    04/08/13  1:00 PM      Result Value Range   Specimen Description FLUID SYNOVIAL LEFT KNEE     Special Requests  PATIENT ON FOLLOWING VANCO     Gram Stain PENDING     Culture       Value: NO GROWTH 1 DAY     Performed at [facility]   Report Status PENDING    ANAEROBIC CULTURE     Status: None   Collection Time    04/08/13  1:00 PM      Result Value Range   Specimen Description FLUID SYNOVIAL LEFT KNEE     Special Requests Immunocompromised     Gram Stain PENDING     Culture       Value: NO ANAEROBES ISOLATED; CULTURE IN PROGRESS FOR 5 DAYS     Performed at [facility]   Report Status PENDING    SYNOVIAL CELL COUNT + DIFF, W/ CRYSTALS     Status: Abnormal   Collection Time    04/08/13  1:00 PM      Result Value Range   Color, Synovial YELLOW  YELLOW   Appearance-Synovial CLOUDY (*) CLEAR   Crystals, Fluid INTRACELLULAR MONOSODIUM URATE CRYSTALS     Comment: EXTRACELLULAR MONOSODIUM URATE CRYSTALS   WBC, Synovial 12408 (*) 0 - 200 /cu mm   Neutrophil, Synovial 90 (*) 0 - 25 %   Lymphocytes-Synovial Fld 1  0 - 20 %   Monocyte-Macrophage-Synovial Fluid 9 (*) 50 - 90 %   Eosinophils-Synovial 0  0 - 1 %  HEPATITIS PANEL, ACUTE     Status: None   Collection Time    04/08/13  3:30 PM      Result Value Range   Hepatitis B Surface Ag NEGATIVE  NEGATIVE   HCV Ab NEGATIVE  NEGATIVE   Hep A IgM NON REACTIVE   NON REACTIVE   Hep B C IgM NON REACTIVE  NON REACTIVE   Comment: (NOTE)     High levels of Hepatitis B Core IgM antibody are detectable     during the acute stage of Hepatitis B. This antibody is used     to differentiate current from past HBV infection.     Performed at [place], [place]     Status: Abnormal   Collection Time    04/08/13  5:22 PM      Result Value Range   Glucose-Capillary 186 (*) 70 - 99 mg/dL  GLUCOSE, CAPILLARY     Status: Abnormal   Collection Time    04/08/13  9:28 PM      Result Value Range   Glucose-Capillary 161 (*) 70 - 99 mg/dL  CK     Status: Abnormal   Collection Time    04/09/13  6:10 AM      Result Value Range   Total CK 440 (*) 7 - 177 U/L  CBC     Status: Abnormal   Collection Time    04/09/13  6:10 AM      Result Value Range   WBC 18.2 (*) 4.0 - 10.5 K/uL   RBC 3.87  3.87 - 5.11 MIL/uL   Hemoglobin 11.5 (*) 12.0 - 15.0 g/dL   HCT 31.6 (*) 36.0 - 46.0 %   MCV 81.7  78.0 - 100.0 fL   MCH 29.7  26.0 - 34.0 pg   MCHC 36.4 (*) 30.0 - 36.0 g/dL   RDW 13.9  11.5 - 15.5 %   Platelets 572 (*) 150 - 400 K/uL  COMPREHENSIVE METABOLIC PANEL     Status: Abnormal   Collection Time    04/09/13  6:10 AM      Result Value Range   Sodium 142  135 - 145 mEq/L   Potassium 4.0  3.5 - 5.1 mEq/L   Chloride 108  96 - 112 mEq/L   CO2 22  19 - 32 mEq/L   Glucose, Bld 126 (*) 70 - 99 mg/dL   BUN 24 (*) 6 - 23 mg/dL   Creatinine, Ser 0.97  0.50 - 1.10 mg/dL   Calcium 9.0  8.4 - 10.5 mg/dL   Total Protein 7.1  6.0 - 8.3 g/dL   Albumin 2.1 (*) 3.5 - 5.2 g/dL   AST 179 (*) 0 - 37 U/L   ALT 264 (*) 0 - 35 U/L   Alkaline Phosphatase 218 (*) 39 - 117 U/L   Total Bilirubin 0.6  0.3 - 1.2 mg/dL   GFR calc non Af Amer 58 (*) >90 mL/min   GFR calc Af Amer 68 (*) >90 mL/min   Comment: (NOTE)     The eGFR has been calculated using the CKD EPI equation.     This calculation has not been validated in all clinical situations.     eGFR's  persistently <90 mL/min signify possible Chronic Kidney     Disease.  GLUCOSE, CAPILLARY     Status: Abnormal   Collection Time    04/09/13  8:47 AM      Result Value Range   Glucose-Capillary 155 (*) 70 - 99 mg/dL      Component Value Date/Time   SDES FLUID SYNOVIAL LEFT KNEE 04/08/2013 1300   SDES FLUID SYNOVIAL LEFT KNEE 04/08/2013 1300   SPECREQUEST PATIENT ON FOLLOWING VANCO 04/08/2013 1300   SPECREQUEST Immunocompromised 04/08/2013 1300   CULT  Value: NO GROWTH 1 DAY Performed at [facility] 04/08/2013 1300   CULT  Value: NO ANAEROBES ISOLATED; CULTURE IN PROGRESS FOR 5 DAYS Performed at [facility] 04/08/2013 1300   REPTSTATUS PENDING 04/08/2013 1300   REPTSTATUS PENDING 04/08/2013 1300   Dg Chest 2 View  04/07/2013   CLINICAL DATA:  Fever  EXAM: CHEST  2 VIEW  COMPARISON:  None.  FINDINGS: The heart size and mediastinal contours are within normal limits. Both lungs are clear. The visualized skeletal structures are unremarkable.  IMPRESSION: No active cardiopulmonary disease.   Electronically Signed   By: Skipper Cliche M.D.   On: 04/07/2013 22:32   Dg Knee Complete 4 Views Left  04/08/2013   CLINICAL DATA:  Left anterior knee pain and swelling for 4 days.  EXAM: LEFT KNEE - COMPLETE 4+ VIEW  COMPARISON:  None.  FINDINGS: There is no evidence of fracture or dislocation. The joint spaces are preserved. No significant degenerative change is seen; the patellofemoral joint is grossly unremarkable in appearance.  A moderate knee joint effusion is seen. The visualized soft tissues are otherwise unremarkable in appearance.  IMPRESSION: 1. No evidence of fracture or dislocation. 2. Moderate joint effusion noted.   Electronically Signed   By: Garald Balding M.D.   On: 04/08/2013 01:12   Dg Knee Complete 4 Views Right  04/08/2013   CLINICAL DATA:  Anterior knee pain and swelling for 4 days.  EXAM: RIGHT KNEE - COMPLETE 4+ VIEW  COMPARISON:  None.  FINDINGS: No acute fracture or  dislocation is identified. Well corticated osseous densities posterior to the tibia are chronic in nature. Joint spaces are preserved. No significant joint effusion appreciated. Mild degenerative spurring is seen within the intercondylar eminences and superior pole of the patella.  IMPRESSION: 1. No acute osseous abnormality about the knee. 2. Moderate degenerative spurring at the intercondylar eminences and superior pole of the patella. .   Electronically Signed   By: Jeannine Boga M.D.   On: 04/08/2013 01:15     Recent Results (from the past 720 hour(s))  CULTURE, BLOOD (ROUTINE X 2)     Status: None   Collection Time    04/07/13 11:00 PM      Result Value Range Status   Specimen Description BLOOD RIGHT ARM   Final   Special Requests BOTTLES DRAWN AEROBIC AND ANAEROBIC 10CC EACH   Final   Culture  Setup Time     Final   Value: 04/08/2013 13:19     Performed at [facility]   Culture     Final   Value: GRAM POSITIVE COCCI IN PAIRS     GRAM NEGATIVE COCCOBACILLI     Note: Gram Stain Report Called to,Read Back By and Verified With: NIKKI MURPHY 04/08/2013 [place]     Performed at [facility]   Report Status PENDING   Incomplete  CULTURE, BLOOD (ROUTINE X 2)     Status: None   Collection Time    04/07/13 11:04 PM      Result Value Range Status   Specimen Description BLOOD RIGHT HAND   Final   Special Requests BOTTLES DRAWN AEROBIC ONLY 2CC   Final   Culture  Setup Time     Final   Value: 04/08/2013 13:19     Performed at [facility]   Culture     Final   Value:        BLOOD CULTURE RECEIVED NO GROWTH TO DATE CULTURE WILL BE HELD FOR 5 DAYS BEFORE ISSUING A FINAL NEGATIVE REPORT     Performed at [facility]   Report Status PENDING   Incomplete  BODY FLUID CULTURE     Status: None   Collection Time    04/08/13  1:00 PM      Result Value Range Status   Specimen Description FLUID SYNOVIAL LEFT KNEE   Final   Special Requests PATIENT ON FOLLOWING  VANCO   Final   Gram Stain PENDING   Incomplete   Culture     Final   Value: NO GROWTH 1 DAY     Performed at [facility]   Report Status PENDING   Incomplete  ANAEROBIC CULTURE     Status: None   Collection Time    04/08/13  1:00 PM      Result Value Range Status   Specimen Description FLUID SYNOVIAL LEFT KNEE   Final   Special Requests Immunocompromised   Final   Gram Stain PENDING   Incomplete   Culture     Final   Value: NO ANAEROBES ISOLATED; CULTURE IN PROGRESS FOR 5 DAYS     Performed at [facility]   Report Status PENDING   Incomplete     Impression/Recommendation  67 year old with apparent gout, and 1/2 cultures with GPC and GNCBacilli. She also has transaminases elevated with alkphosph Her TM since admission was 101  # 1 Positive culture with GPC and GNCB on GS, spoke with lab and the colonies are very small they will re-examine the GS. Other blood culture NO GROWTH. I suspect these are contaminants, will follow culture but will dc her abx  #2 Gout: I do NOT think she has evidence of septic joint with her wbc  count and crystals found  #3 ARF: likely due to dehydation  #4 CPK up: likely due to ehr acute illness and poor po intake  #5 Elevated LFTs: not sure of cause, RUQ [place] would be prudent  #6 Screening: check HIV     Thank you so much for this interesting consult  [place] [facility] [place] [phone] (pager) [phone] (office) 04/09/2013, 12:13 PM  Rhina [place] 04/09/2013, 12:13 PM

## 2013-04-09 NOTE — Progress Notes (Addendum)
INITIAL NUTRITION ASSESSMENT  DOCUMENTATION CODES Per approved criteria  -Not Applicable   INTERVENTION: 1. Glucerna Shakes TID, 8 oz provides 220 kcal, 10 g protein 2. Encourage adequate intake.   NUTRITION DIAGNOSIS: Inadequate oral intake related to food preferences as evidenced by 10% meal intake.   Goal: Patient will meet >/=90% of estimated nutrition needs  Monitor:  PO intake, weight, labs, I/Os  Reason for Assessment: Consult, Malnutrition screening tool  67 y.o. female  Admitting Dx: Sepsis  ASSESSMENT: Patient with history of diabetes, and hypertension, admitted with bilateral knee pain, sepsis secondary to acute gouty arthritis. She reports that her oral intake is decreased during this admission. However, she attributes this to food preferences. She is willing to try nutrition supplements to improve intake. She does report that she feels like she has lost weight, but is unable to state her usual weight.   Height: Ht Readings from Last 1 Encounters:  04/07/13 4\' 11"  (1.499 m)    Weight: Wt Readings from Last 1 Encounters:  04/08/13 124 lb 1.9 oz (56.3 kg)    Ideal Body Weight: 95 pounds  % Ideal Body Weight: 131%  Wt Readings from Last 10 Encounters:  04/08/13 124 lb 1.9 oz (56.3 kg)    Usual Body Weight: Unknown  % Usual Body Weight:   BMI:  Body mass index is 25.06 kg/(m^2). Patient is overweight.   Estimated Nutritional Needs: Kcal: 1300-1400 kcal Protein: 75-85 g Fluid: >1.7 L/day  Skin: Stage 2 pressure ulcer, buttocks, wound left knee, bilateral lower extremity edema  Diet Order: Carb Control  EDUCATION NEEDS: -No education needs identified at this time   Intake/Output Summary (Last 24 hours) at 04/09/13 1312 Last data filed at 04/08/13 1900  Gross per 24 hour  Intake 1427.5 ml  Output      0 ml  Net 1427.5 ml    Last BM: 11/10   Labs:   Recent Labs Lab 04/07/13 2010 04/08/13 0230 04/09/13 0610  NA 140 138  136 142   K 4.0 3.4*  3.4* 4.0  CL 101 99  98 108  CO2  --  26  26 22   BUN 35* 35*  34* 24*  CREATININE 1.60* 1.17*  1.16* 0.97  CALCIUM  --  9.3  9.1 9.0  GLUCOSE 116* 157*  159* 126*    CBG (last 3)   Recent Labs  04/08/13 2128 04/09/13 0847 04/09/13 1214  GLUCAP 161* 155* 162*    Scheduled Meds: . heparin  5,000 Units Subcutaneous Q8H  . insulin aspart  0-15 Units Subcutaneous TID WC  . insulin aspart  0-5 Units Subcutaneous QHS  . naproxen  500 mg Oral BID WC    Continuous Infusions: . sodium chloride 100 mL/hr at 04/08/13 1600    Past Medical History  Diagnosis Date  . Arthritis   . Hypertension   . Diabetes mellitus without complication     Past Surgical History  Procedure Laterality Date  . Skin grafts      Larey Seat, RD, LDN Pager #: [phone] [place] Pager #: [phone]

## 2013-04-09 NOTE — Progress Notes (Signed)
[facility] PROGRESS NOTE  DANNAH NJOKU [id number] DOB: 21-Jun-1945 DOA: 04/07/2013 PCP: Salena Saner., MD   Brief narrative  62 F with HTN, DM, arthritis presented with pain over her bilateral knees and ankles for 4 days. Patient septic with fever of 101F,  leucocytosis and tachycardia. Elevated ESR and CRP. Moderate effusion over left knee joint which was tapped by ortho and shows wbc of 12k with MSU crystals. Blood cx 1/2 growing  Assessment/Plan:  Sepsis secondary to? Septic arthritis vs acute polyarticular gouty arthritis Added empiric IV vancomycin for possible septic arthritis.. Noted elevated ESR, CRP and CPK. Added cefepime. 1/2 blood cx on admission growing GPC and GN coccobacilli. Ortho consulted. Knee joint tapped . Fluid showing wbc of 12 k and MSU. cx so far no growth. Elevated uric acid level. Will place her on naproxen bid. Continue pain control.  Check 2 D echo for possible vegetation. Check HIV.  Transaminitis Unclear etiology. check liver US. Elevated CPK on admission. UA unremarkable trending down in am labs.   Diabetes mellitus  Continue sliding scale   Hypertension  Blood pressure stable   Hypokalemia  replenished   generalized weakness  secondary to pain in legs. PT eval   DVT prophylaxis: Subcutaneous heparin   Code Status: Full code   Family Communication: None at bedside  Disposition: Currently INpatient   Consultants:  Dr Onnie Graham ( ortho) ID  Procedures:  Left knee joint  Antibiotics:  IV vancomycin and ceftriaxone ( 11/9>>) HPI/Subjective:  Patient complains of pain in her knees and ankles. Admission H&P reviewed   Objective: Filed Vitals:   04/09/13 0427  BP: 143/69  Pulse: 87  Temp: 98.9 F (37.2 C)  Resp: 16    Intake/Output Summary (Last 24 hours) at 04/09/13 1236 Last data filed at 04/08/13 1900  Gross per 24 hour  Intake 1577.5 ml  Output      0 ml  Net 1577.5 ml   Filed Weights   04/07/13 1912  04/08/13 0147 04/08/13 2004  Weight: 56.246 kg (124 lb) 53.4 kg (117 lb 11.6 oz) 56.3 kg (124 lb 1.9 oz)    Exam: General: An elderly female in no acute distress  HEENT: No pallor, moist oral mucosa  Chest: Clear to auscultation bilaterally, no added sounds  CVS: Normal S1 and S2, no murmurs or gallop  Abdomen: Soft, nontender, nondistended, bowel sounds present  Extremities:swelling of left knee, tender to palpation over bilateral knees (L>R),swelling  With tenderness  to palpation over her ankles and toes. Poor  mobility of the legs due to pain.  CNS: AAO x3   Data Reviewed: Basic Metabolic Panel:  Recent Labs Lab 04/07/13 2010 04/08/13 0230 04/09/13 0610  NA 140 138  136 142  K 4.0 3.4*  3.4* 4.0  CL 101 99  98 108  CO2  --  26  26 22   GLUCOSE 116* 157*  159* 126*  BUN 35* 35*  34* 24*  CREATININE 1.60* 1.17*  1.16* 0.97  CALCIUM  --  9.3  9.1 9.0   Liver Function Tests:  Recent Labs Lab 04/08/13 0230 04/09/13 0610  AST 139*  137* 179*  ALT 165*  163* 264*  ALKPHOS 190*  193* 218*  BILITOT 0.8  0.8 0.6  PROT 7.4  7.3 7.1  ALBUMIN 2.4*  2.3* 2.1*   No results found for this basename: LIPASE, AMYLASE,  in the last 168 hours No results found for this basename: AMMONIA,  in the last 168 hours  CBC:  Recent Labs Lab 04/07/13 2006 04/07/13 2010 04/08/13 0230 04/09/13 0610  WBC 20.2*  --  18.9* 18.2*  NEUTROABS 16.4*  --   --   --   HGB 12.7 13.6 11.9* 11.5*  HCT 34.6* 40.0 32.7* 31.6*  MCV 81.6  --  83.4 81.7  PLT 539*  --  516* 572*   Cardiac Enzymes:  Recent Labs Lab 04/08/13 0230 04/09/13 0610  CKTOTAL 1567* 440*   BNP (last 3 results) No results found for this basename: PROBNP,  in the last 8760 hours CBG:  Recent Labs Lab 04/08/13 1113 04/08/13 1722 04/08/13 2128 04/09/13 0847 04/09/13 1214  GLUCAP 208* 186* 161* 155* 162*    Recent Results (from the past 240 hour(s))  CULTURE, BLOOD (ROUTINE X 2)     Status: None    Collection Time    04/07/13 11:00 PM      Result Value Range Status   Specimen Description BLOOD RIGHT ARM   Final   Special Requests BOTTLES DRAWN AEROBIC AND ANAEROBIC 10CC EACH   Final   Culture  Setup Time     Final   Value: 04/08/2013 13:19     Performed at [facility]   Culture     Final   Value: GRAM POSITIVE COCCI IN PAIRS     GRAM NEGATIVE COCCOBACILLI     Note: Gram Stain Report Called to,Read Back By and Verified With: NIKKI MURPHY 04/08/2013 [place]     Performed at [facility]   Report Status PENDING   Incomplete  CULTURE, BLOOD (ROUTINE X 2)     Status: None   Collection Time    04/07/13 11:04 PM      Result Value Range Status   Specimen Description BLOOD RIGHT HAND   Final   Special Requests BOTTLES DRAWN AEROBIC ONLY 2CC   Final   Culture  Setup Time     Final   Value: 04/08/2013 13:19     Performed at [facility]   Culture     Final   Value:        BLOOD CULTURE RECEIVED NO GROWTH TO DATE CULTURE WILL BE HELD FOR 5 DAYS BEFORE ISSUING A FINAL NEGATIVE REPORT     Performed at [facility]   Report Status PENDING   Incomplete  BODY FLUID CULTURE     Status: None   Collection Time    04/08/13  1:00 PM      Result Value Range Status   Specimen Description FLUID SYNOVIAL LEFT KNEE   Final   Special Requests PATIENT ON FOLLOWING VANCO   Final   Gram Stain PENDING   Incomplete   Culture     Final   Value: NO GROWTH 1 DAY     Performed at [facility]   Report Status PENDING   Incomplete  ANAEROBIC CULTURE     Status: None   Collection Time    04/08/13  1:00 PM      Result Value Range Status   Specimen Description FLUID SYNOVIAL LEFT KNEE   Final   Special Requests Immunocompromised   Final   Gram Stain PENDING   Incomplete   Culture     Final   Value: NO ANAEROBES ISOLATED; CULTURE IN PROGRESS FOR 5 DAYS     Performed at [facility]   Report Status PENDING   Incomplete     Studies: Dg Chest 2  View  04/07/2013  CLINICAL DATA:  Fever  EXAM: CHEST  2 VIEW  COMPARISON:  None.  FINDINGS: The heart size and mediastinal contours are within normal limits. Both lungs are clear. The visualized skeletal structures are unremarkable.  IMPRESSION: No active cardiopulmonary disease.   Electronically Signed   By: Skipper Cliche M.D.   On: 04/07/2013 22:32   Dg Knee Complete 4 Views Left  04/08/2013   CLINICAL DATA:  Left anterior knee pain and swelling for 4 days.  EXAM: LEFT KNEE - COMPLETE 4+ VIEW  COMPARISON:  None.  FINDINGS: There is no evidence of fracture or dislocation. The joint spaces are preserved. No significant degenerative change is seen; the patellofemoral joint is grossly unremarkable in appearance.  A moderate knee joint effusion is seen. The visualized soft tissues are otherwise unremarkable in appearance.  IMPRESSION: 1. No evidence of fracture or dislocation. 2. Moderate joint effusion noted.   Electronically Signed   By: Garald Balding M.D.   On: 04/08/2013 01:12   Dg Knee Complete 4 Views Right  04/08/2013   CLINICAL DATA:  Anterior knee pain and swelling for 4 days.  EXAM: RIGHT KNEE - COMPLETE 4+ VIEW  COMPARISON:  None.  FINDINGS: No acute fracture or dislocation is identified. Well corticated osseous densities posterior to the tibia are chronic in nature. Joint spaces are preserved. No significant joint effusion appreciated. Mild degenerative spurring is seen within the intercondylar eminences and superior pole of the patella.  IMPRESSION: 1. No acute osseous abnormality about the knee. 2. Moderate degenerative spurring at the intercondylar eminences and superior pole of the patella. .   Electronically Signed   By: Jeannine Boga M.D.   On: 04/08/2013 01:15    Scheduled Meds: . colchicine  0.6 mg Oral TID  . heparin  5,000 Units Subcutaneous Q8H  . insulin aspart  0-15 Units Subcutaneous TID WC  . insulin aspart  0-5 Units Subcutaneous QHS   Continuous Infusions: .  sodium chloride 100 mL/hr at 04/08/13 1600      Time spent: 35 minutes    Emilyn Ruble, [place] [facility] Pager [phone] If 7PM-7AM, please contact night-coverage at [email or web address], password [facility] 04/09/2013, 12:36 PM  LOS: 2 days

## 2013-04-09 NOTE — Progress Notes (Signed)
Sarah Colon  MRN: [id number] DOB/Age: Sep 27, 1945 67 y.o. Physician: Rada Hay Procedure:       Subjective: Still diffusely painful in feet and legs as well as knee. Reports no improvement  Vital Signs Temp:  [98.5 F (36.9 C)-99.5 F (37.5 C)] 98.5 F (36.9 C) (11/10 1400) Pulse Rate:  [87-103] 88 (11/10 1400) Resp:  [16-18] 18 (11/10 1400) BP: (121-165)/(59-69) 165/69 mmHg (11/10 1400) SpO2:  [98 %-100 %] 100 % (11/10 1400) Weight:  [56.3 kg (124 lb 1.9 oz)] 56.3 kg (124 lb 1.9 oz) (11/09 2004)  Lab Results  Recent Labs  04/08/13 0230 04/09/13 0610  WBC 18.9* 18.2*  HGB 11.9* 11.5*  HCT 32.7* 31.6*  PLT 516* 572*   BMET  Recent Labs  04/08/13 0230 04/09/13 0610  NA 138  136 142  K 3.4*  3.4* 4.0  CL 99  98 108  CO2 26  26 22   GLUCOSE 157*  159* 126*  BUN 35*  34* 24*  CREATININE 1.17*  1.16* 0.97  CALCIUM 9.3  9.1 9.0   INR  Date Value Range Status  04/08/2013 1.23  0.00 - 1.49 Final     Exam Knee still shows mild effusion but not erythematous. No signs of septic arthritis        Plan Continued pain with joint fluid yielding crystals consistent with Gout. 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 Currently on naprosyn but received one dose only of colchicine. No signs of anything requiring operative intervention. Continue prn analgesics  [name] for Dr.Kevin Supple 04/09/2013, 3:46 PM

## 2013-04-10 ENCOUNTER — Inpatient Hospital Stay (HOSPITAL_COMMUNITY): Payer: Medicare Other

## 2013-04-10 DIAGNOSIS — I519 Heart disease, unspecified: Secondary | ICD-10-CM

## 2013-04-10 DIAGNOSIS — M109 Gout, unspecified: Secondary | ICD-10-CM | POA: Diagnosis present

## 2013-04-10 LAB — COMPREHENSIVE METABOLIC PANEL
AST: 152 U/L — ABNORMAL HIGH (ref 0–37)
BUN: 21 mg/dL (ref 6–23)
CO2: 23 mEq/L (ref 19–32)
Calcium: 8.8 mg/dL (ref 8.4–10.5)
Chloride: 107 mEq/L (ref 96–112)
Creatinine, Ser: 1.17 mg/dL — ABNORMAL HIGH (ref 0.50–1.10)
GFR calc Af Amer: 54 mL/min — ABNORMAL LOW (ref >90)
GFR calc non Af Amer: 46 mL/min — ABNORMAL LOW (ref >90)
Glucose, Bld: 118 mg/dL — ABNORMAL HIGH (ref 70–99)
Total Bilirubin: 0.4 mg/dL (ref 0.3–1.2)

## 2013-04-10 LAB — CBC
HCT: 33.5 % — ABNORMAL LOW (ref 36.0–46.0)
Hemoglobin: 11.9 g/dL — ABNORMAL LOW (ref 12.0–15.0)
MCH: 29.4 pg (ref 26.0–34.0)
MCHC: 35.5 g/dL (ref 30.0–36.0)
MCV: 82.7 fL (ref 78.0–100.0)
RBC: 4.05 MIL/uL (ref 3.87–5.11)
WBC: 14.1 10*3/uL — ABNORMAL HIGH (ref 4.0–10.5)

## 2013-04-10 LAB — GLUCOSE, CAPILLARY
Glucose-Capillary: 110 mg/dL — ABNORMAL HIGH (ref 70–99)
Glucose-Capillary: 166 mg/dL — ABNORMAL HIGH (ref 70–99)
Glucose-Capillary: 94 mg/dL (ref 70–99)

## 2013-04-10 NOTE — Progress Notes (Signed)
Physical Therapy Evaluation Patient Details Name: TAIS BASCH MRN: [id number] DOB: Oct 14, 1945 Today's Date: 04/10/2013 Time: [id number] PT Time Calculation (min): 26 min  PT Assessment / Plan / Recommendation History of Present Illness  CARLING OLSHEFSKI is a 67 y.o. female with Past medical history of arthritis, hypertension, diabetes, skin grafting.  She is admitted with about a week of progressive leg pain and weakness to the point that she was unable to ambulate.  Nursing report that pt has had multiple episodes of incontinence today and pt reports she is unable to control it  Clinical Impression  Pt is dependent in functional mobility and is at high risk to fall.  She lives alone and will need to be independent prior to d/c. She had potential to improve with continued post acute inpatient PT    PT Assessment  Patient needs continued PT services    Follow Up Recommendations  SNF    Does the patient have the potential to tolerate intense rehabilitation      Barriers to Discharge Decreased caregiver support      Equipment Recommendations  Rolling walker with 5" wheels    Recommendations for Other Services OT consult   Frequency Min 3X/week    Precautions / Restrictions Precautions Precautions: Fall   Pertinent Vitals/Pain Pt did not c/o pain this afternoon      Mobility  Bed Mobility Bed Mobility: Rolling Right;Rolling Left;Supine to Sit Rolling Right: 4: Min assist Rolling Left: 4: Min assist Supine to Sit: 4: Min assist Details for Bed Mobility Assistance: pt incontinent of urine Transfers Transfers: Sit to Stand;Stand to Sit Sit to Stand: 2: Max assist Stand to Sit: 2: Max assist Details for Transfer Assistance: pt needed multiple attempts and significant assist to lift hips off bed/chair.  She had difficulty extending hips and knees to stand erect.  Kneed appeared at risk to buckle Ambulation/Gait Ambulation/Gait Assistance: Not tested (comment) General  Gait Details: did not progress gait as pt appeared at high risk to have knees buckle under her. She was unable to stand erect with hips and knees extended Stairs: No Wheelchair Mobility Wheelchair Mobility: No    Exercises General Exercises - Lower Extremity Ankle Circles/Pumps: AROM;Both;5 reps;Seated Long Arc Quad: AROM;10 reps;Seated Hip Flexion/Marching: AROM;Both;10 reps;Seated   PT Diagnosis: Difficulty walking;Abnormality of gait;Generalized weakness  PT Problem List: Decreased strength;Decreased activity tolerance;Decreased balance;Decreased mobility;Decreased knowledge of use of DME PT Treatment Interventions: DME instruction;Gait training;Functional mobility training;Therapeutic activities;Therapeutic exercise;Balance training     PT Goals(Current goals can be found in the care plan section) Acute Rehab PT Goals Patient Stated Goal: to get back to her apartment and be independent PT Goal Formulation: With patient Time For Goal Achievement: 04/24/13 Potential to Achieve Goals: Good  Visit Information  Last PT Received On: 04/10/13 Assistance Needed: +2 History of Present Illness: MOESHIA WILFONG is a 67 y.o. female with Past medical history of arthritis, hypertension, diabetes, skin grafting.  She is admitted with about a week of progressive leg pain and weakness to the point that she was unable to ambulate.  Nursing report that pt has had multiple episodes of incontinence today and pt reports she is unable to control it       Prior [place] expects to be discharged to:: Private residence Living Arrangements: Alone Available Help at Discharge: Friend(s);Available PRN/intermittently Type of Home: Apartment Home Layout: One level Prior Function Level of Independence: Independent Communication Communication: No difficulties    Cognition  Cognition  Arousal/Alertness: Awake/alert Behavior During Therapy: WFL for tasks  assessed/performed Overall Cognitive Status: Within Functional Limits for tasks assessed    Extremity/Trunk Assessment Lower Extremity Assessment Lower Extremity Assessment: RLE deficits/detail;LLE deficits/detail RLE Deficits / Details: atrophy and weakness noted with strength ~ 3/5 LLE Deficits / Details: atrophy and weakness noted with strength ~ 3/5 Cervical / Trunk Assessment Cervical / Trunk Assessment: Normal   Balance Balance Balance Assessed: Yes Static Sitting Balance Static Sitting - Balance Support: No upper extremity supported;Feet unsupported Static Sitting - Level of Assistance: 5: Stand by assistance Static Standing Balance Static Standing - Balance Support: Bilateral upper extremity supported Static Standing - Level of Assistance: 2: Max assist  End of Session PT - End of Session Activity Tolerance: Patient tolerated treatment well Patient left: in chair;with nursing/sitter in room Nurse Communication: Mobility status;Need for lift equipment (recommend use of Stedy)  GP    Helene Kelp K. [place], [place]  04/10/2013, 4:39 PM

## 2013-04-10 NOTE — Progress Notes (Signed)
[facility] PROGRESS NOTE  Sarah Colon [id number] DOB: 26-Mar-1946 DOA: 04/07/2013 PCP: Salena Saner., MD  Brief narrative  89 F with HTN, DM, arthritis presented with pain over her bilateral knees and ankles for 4 days. Patient septic with fever of 101F, leucocytosis and tachycardia. Elevated ESR and CRP. Moderate effusion over left knee joint which was tapped by ortho and shows wbc of 12k with MSU crystals. Blood cx 1/2 growing.   Assessment/Plan:  Sepsis secondary to? Septic arthritis vs acute polyarticular gouty arthritis  More likely acute gouty arthritis vs septic jt. Added empiric IV vancomycin for possible septic arthritis.. Noted elevated ESR, CRP and CPK. Added cefepime. 1/2 blood cx on admission growing GPC and GN coccobacilli.  Ortho consulted. Knee joint tapped . Fluid showing wbc of 12k and MSU. cx so far no growth. Elevated uric acid level. placed her on naproxen bid. Joint swelling has improved. Continue pain control.  Check 2 D echo for possible vegetation.  HIV ab negative.   Transaminitis   liver US shows fatty liver.. Elevated CPK on admission. UA unremarkable  LFTs improving. Recheck in am.   Diabetes mellitus  Continue sliding scale   Hypertension  Blood pressure stable   Hypokalemia  replenished   generalized weakness  secondary to pain in legs. PT eval   DVT prophylaxis: Subcutaneous heparin   Code Status: Full code   Family Communication: None at bedside   Disposition: Currently INpatient  Consultants:  Dr Onnie Graham ( ortho)  ID   Procedures:  Left knee joint aspiration   Antibiotics:  IV vancomycin and ceftriaxone ( 11/9>>)   HPI/Subjective:  Pain over the knee and ankle improved. Feels weak   Objective: Filed Vitals:   04/10/13 1100  BP: 153/62  Pulse: 86  Temp: 98.1 F (36.7 C)  Resp: 18    Intake/Output Summary (Last 24 hours) at 04/10/13 1326 Last data filed at 04/09/13 2115  Gross per 24 hour  Intake    1120 ml  Output      0 ml  Net   1120 ml   Filed Weights   04/08/13 0147 04/08/13 2004 04/09/13 2111  Weight: 53.4 kg (117 lb 11.6 oz) 56.3 kg (124 lb 1.9 oz) 56.3 kg (124 lb 1.9 oz)    Exam: General: An elderly female in no acute distress  HEENT: No pallor, moist oral mucosa  Chest: Clear to auscultation bilaterally, no added sounds  CVS: Normal S1 and S2, no murmurs or gallop  Abdomen: Soft, nontender, nondistended, bowel sounds present Extremities:swelling over the knee and ankle jts resolved. Non tender CNS: AAO x3  Data Reviewed: Basic Metabolic Panel:  Recent Labs Lab 04/07/13 2010 04/08/13 0230 04/09/13 0610  NA 140 138  136 142  K 4.0 3.4*  3.4* 4.0  CL 101 99  98 108  CO2  --  26  26 22   GLUCOSE 116* 157*  159* 126*  BUN 35* 35*  34* 24*  CREATININE 1.60* 1.17*  1.16* 0.97  CALCIUM  --  9.3  9.1 9.0   Liver Function Tests:  Recent Labs Lab 04/08/13 0230 04/09/13 0610  AST 139*  137* 179*  ALT 165*  163* 264*  ALKPHOS 190*  193* 218*  BILITOT 0.8  0.8 0.6  PROT 7.4  7.3 7.1  ALBUMIN 2.4*  2.3* 2.1*   No results found for this basename: LIPASE, AMYLASE,  in the last 168 hours No results found for this basename: AMMONIA,  in the last  168 hours CBC:  Recent Labs Lab 04/07/13 2006 04/07/13 2010 04/08/13 0230 04/09/13 0610 04/10/13 0529  WBC 20.2*  --  18.9* 18.2* 14.1*  NEUTROABS 16.4*  --   --   --   --   HGB 12.7 13.6 11.9* 11.5* 11.9*  HCT 34.6* 40.0 32.7* 31.6* 33.5*  MCV 81.6  --  83.4 81.7 82.7  PLT 539*  --  516* 572* 648*   Cardiac Enzymes:  Recent Labs Lab 04/08/13 0230 04/09/13 0610  CKTOTAL 1567* 440*   BNP (last 3 results) No results found for this basename: PROBNP,  in the last 8760 hours CBG:  Recent Labs Lab 04/09/13 1214 04/09/13 1706 04/09/13 2115 04/10/13 0810 04/10/13 1300  GLUCAP 162* 114* 145* 110* 166*    Recent Results (from the past 240 hour(s))  CULTURE, BLOOD (ROUTINE X 2)     Status:  None   Collection Time    04/07/13 11:00 PM      Result Value Range Status   Specimen Description BLOOD RIGHT ARM   Final   Special Requests BOTTLES DRAWN AEROBIC AND ANAEROBIC 10CC EACH   Final   Culture  Setup Time     Final   Value: 04/08/2013 13:19     Performed at [facility]   Culture     Final   Value: GRAM POSITIVE COCCI IN PAIRS     GRAM NEGATIVE COCCOBACILLI     Note: Gram Stain Report Called to,Read Back By and Verified With: NIKKI MURPHY 04/08/2013 [place]     Performed at [facility]   Report Status PENDING   Incomplete  CULTURE, BLOOD (ROUTINE X 2)     Status: None   Collection Time    04/07/13 11:04 PM      Result Value Range Status   Specimen Description BLOOD RIGHT HAND   Final   Special Requests BOTTLES DRAWN AEROBIC ONLY 2CC   Final   Culture  Setup Time     Final   Value: 04/08/2013 13:19     Performed at [facility]   Culture     Final   Value:        BLOOD CULTURE RECEIVED NO GROWTH TO DATE CULTURE WILL BE HELD FOR 5 DAYS BEFORE ISSUING A FINAL NEGATIVE REPORT     Performed at [facility]   Report Status PENDING   Incomplete  BODY FLUID CULTURE     Status: None   Collection Time    04/08/13  1:00 PM      Result Value Range Status   Specimen Description FLUID SYNOVIAL LEFT KNEE   Final   Special Requests PATIENT ON FOLLOWING VANCO   Final   Gram Stain     Final   Value: MODERATE WBC PRESENT, PREDOMINANTLY MONONUCLEAR     NO ORGANISMS SEEN     Performed at [facility]     Final   Value: NO GROWTH 1 DAY     Performed at [facility]   Report Status PENDING   Incomplete  ANAEROBIC CULTURE     Status: None   Collection Time    04/08/13  1:00 PM      Result Value Range Status   Specimen Description FLUID SYNOVIAL LEFT KNEE   Final   Special Requests Immunocompromised   Final   Gram Stain     Final   Value: MODERATE WBC PRESENT,BOTH PMN AND MONONUCLEAR  NO ORGANISMS SEEN     Performed at  [facility]   Culture     Final   Value: NO ANAEROBES ISOLATED; CULTURE IN PROGRESS FOR 5 DAYS     Performed at [facility]   Report Status PENDING   Incomplete     Studies: US Abdomen Complete  04/10/2013   CLINICAL DATA:  Elevated liver function tests.  EXAM: ULTRASOUND ABDOMEN COMPLETE  COMPARISON:  None.  FINDINGS: Gallbladder  A single mobile 1.6 cm stone is seen in the gallbladder. There is no gallbladder wall thickening or pericholecystic fluid. Sonographer reports negative Murphy's sign.  Common bile duct  Diameter: Measures 0.9 cm.  Liver  Demonstrates increased echogenicity consistent with fatty infiltration. No focal lesion or intrahepatic biliary ductal dilatation is identified.  IVC  No abnormality visualized.  Pancreas  Visualized portion unremarkable.  Spleen  Size and appearance within normal limits.  Right Kidney  Length: Measures 8.8 cm. There is increased cortical echogenicity. No stone, mass or hydronephrosis is identified.  Left Kidney  Length: Measures 9.0 cm. . There is increased cortical echogenicity. No stone, mass or hydronephrosis.  Abdominal aorta  No aneurysm visualized.  IMPRESSION: Single 1.6 cm gallstone without evidence of cholecystitis.  Fatty infiltration of the liver.  Findings compatible with medical renal disease.  Mildly prominent common bile duct without frank dilatation. Negative for intrahepatic biliary ductal dilatation.   Electronically Signed   By: Inge Rise M.D.   On: 04/10/2013 07:21    Scheduled Meds: . feeding supplement (GLUCERNA SHAKE)  237 mL Oral TID BM  . heparin  5,000 Units Subcutaneous Q8H  . insulin aspart  0-15 Units Subcutaneous TID WC  . insulin aspart  0-5 Units Subcutaneous QHS  . naproxen  500 mg Oral BID WC   Continuous Infusions: . sodium chloride 100 mL/hr at 04/09/13 1516     Time spent: 25 minutes    Shauntae Reitman  [facility] Pager [phone]. If 7PM-7AM, please contact night-coverage  at [email or web address], password [facility] 04/10/2013, 1:26 PM  LOS: 3 days

## 2013-04-10 NOTE — Progress Notes (Signed)
  Echocardiogram 2D Echocardiogram has been performed.  Sarah Colon 04/10/2013, 2:34 PM

## 2013-04-10 NOTE — Progress Notes (Signed)
[place] [facility]    Subjective: No new complaints   Antibiotics:  Anti-infectives   Start     Dose/Rate Route Frequency Ordered Stop   04/08/13 2200  cefTRIAXone (ROCEPHIN) 1 g in dextrose 5 % 50 mL IVPB  Status:  Discontinued     1 g 100 mL/hr over 30 Minutes Intravenous Every 24 hours 04/08/13 0153 04/08/13 1526   04/08/13 1600  ceFEPIme (MAXIPIME) 1 g in dextrose 5 % 50 mL IVPB  Status:  Discontinued     1 g 100 mL/hr over 30 Minutes Intravenous Every 24 hours 04/08/13 1531 04/09/13 1223   04/08/13 1130  vancomycin (VANCOCIN) 750 mg in sodium chloride 0.9 % 150 mL IVPB  Status:  Discontinued     750 mg 150 mL/hr over 60 Minutes Intravenous Every 24 hours 04/08/13 1123 04/09/13 1223   04/07/13 2300  cefTRIAXone (ROCEPHIN) 1 g in dextrose 5 % 50 mL IVPB     1 g 100 mL/hr over 30 Minutes Intravenous  Once 04/07/13 2249 04/07/13 2359      Medications: Scheduled Meds: . feeding supplement (GLUCERNA SHAKE)  237 mL Oral TID BM  . heparin  5,000 Units Subcutaneous Q8H  . insulin aspart  0-15 Units Subcutaneous TID WC  . insulin aspart  0-5 Units Subcutaneous QHS  . naproxen  500 mg Oral BID WC   Continuous Infusions: . sodium chloride 100 mL/hr at 04/09/13 1516   PRN Meds:.acetaminophen, acetaminophen, ondansetron (ZOFRAN) IV, ondansetron, oxyCODONE-acetaminophen, traMADol   Objective: Weight change: 0 lb (0 kg)  Intake/Output Summary (Last 24 hours) at 04/10/13 1727 Last data filed at 04/10/13 1600  Gross per 24 hour  Intake   2780 ml  Output      0 ml  Net   2780 ml   Blood pressure 140/60, pulse 82, temperature 98.4 F (36.9 C), temperature source Oral, resp. rate 18, height 4\' 11"  (1.499 m), weight 124 lb 1.9 oz (56.3 kg), SpO2 100.00%. Temp:  [98 F (36.7 C)-98.6 F (37 C)] 98.4 F (36.9 C) (11/11 1400) Pulse Rate:  [80-86] 82 (11/11 1400) Resp:  [18] 18 (11/11 1400) BP: (140-153)/(60-72) 140/60 mmHg (11/11 1400) SpO2:  [100 %] 100  % (11/11 1400) Weight:  [124 lb 1.9 oz (56.3 kg)] 124 lb 1.9 oz (56.3 kg) (11/10 2111)  Physical Exam: General: Alert and awake, oriented x3, not in any acute distress.  HEENT: anicteric sclera, pupils reactive to light and accommodation, EOMI, oropharynx clear and without exudate  CVS regular rate, normal r, no murmur rubs or gallops  Chest: clear to auscultation bilaterally, no wheezing, rales or rhonchi  Abdomen: soft nontender, nondistended, normal bowel sounds,  Extremities: bilateral knees are warm and tender to palpation with slight effusions.  Ankles also tender to palpation wrists elbows nontender  Neuro: nonfocal, strength and sensation intact    Lab Results:  Recent Labs  04/09/13 0610 04/10/13 0529  WBC 18.2* 14.1*  HGB 11.5* 11.9*  HCT 31.6* 33.5*  PLT 572* 648*    BMET  Recent Labs  04/09/13 0610 04/10/13 1455  NA 142 140  K 4.0 4.4  CL 108 107  CO2 22 23  GLUCOSE 126* 118*  BUN 24* 21  CREATININE 0.97 1.17*  CALCIUM 9.0 8.8    Micro Results: Recent Results (from the past 240 hour(s))  CULTURE, BLOOD (ROUTINE X 2)     Status: None   Collection Time    04/07/13 11:00 PM  Result Value Range Status   Specimen Description BLOOD RIGHT ARM   Final   Special Requests BOTTLES DRAWN AEROBIC AND ANAEROBIC 10CC EACH   Final   Culture  Setup Time     Final   Value: 04/08/2013 13:19     THE SIGNIFICANCE OF ISOLATING THIS ORGANISM FROM A SINGLE SET OF BLOOD CULTURES WHEN MULTIPLE SETS ARE DRAWN IS UNCERTAIN. PLEASE NOTIFY THE MICROBIOLOGY DEPARTMENT WITHIN ONE WEEK IF SPECIATION AND SENSITIVITIES ARE REQUIRED.     Performed at [facility]     Final   Value: GRAM NEGATIVE COCCOBACILLI     STAPHYLOCOCCUS SPECIES (COAGULASE NEGATIVE)     Note: Gram Stain Report Called to,Read Back By and Verified With: NIKKI MURPHY 04/08/2013 [place]     Performed at [facility]   Report Status PENDING   Incomplete  CULTURE, BLOOD (ROUTINE X 2)      Status: None   Collection Time    04/07/13 11:04 PM      Result Value Range Status   Specimen Description BLOOD RIGHT HAND   Final   Special Requests BOTTLES DRAWN AEROBIC ONLY 2CC   Final   Culture  Setup Time     Final   Value: 04/08/2013 13:19     Performed at [facility]   Culture     Final   Value:        BLOOD CULTURE RECEIVED NO GROWTH TO DATE CULTURE WILL BE HELD FOR 5 DAYS BEFORE ISSUING A FINAL NEGATIVE REPORT     Performed at [facility]   Report Status PENDING   Incomplete  BODY FLUID CULTURE     Status: None   Collection Time    04/08/13  1:00 PM      Result Value Range Status   Specimen Description FLUID SYNOVIAL LEFT KNEE   Final   Special Requests PATIENT ON FOLLOWING VANCO   Final   Gram Stain     Final   Value: MODERATE WBC PRESENT, PREDOMINANTLY MONONUCLEAR     NO ORGANISMS SEEN     Performed at [facility]   Culture     Final   Value: NO GROWTH 2 DAYS     Performed at [facility]   Report Status PENDING   Incomplete  ANAEROBIC CULTURE     Status: None   Collection Time    04/08/13  1:00 PM      Result Value Range Status   Specimen Description FLUID SYNOVIAL LEFT KNEE   Final   Special Requests Immunocompromised   Final   Gram Stain     Final   Value: MODERATE WBC PRESENT,BOTH PMN AND MONONUCLEAR     NO ORGANISMS SEEN     Performed at [facility]   Culture     Final   Value: NO ANAEROBES ISOLATED; CULTURE IN PROGRESS FOR 5 DAYS     Performed at [facility]   Report Status PENDING   Incomplete    Studies/Results: US Abdomen Complete  04/10/2013   CLINICAL DATA:  Elevated liver function tests.  EXAM: ULTRASOUND ABDOMEN COMPLETE  COMPARISON:  None.  FINDINGS: Gallbladder  A single mobile 1.6 cm stone is seen in the gallbladder. There is no gallbladder wall thickening or pericholecystic fluid. Sonographer reports negative Murphy's sign.  Common bile duct  Diameter: Measures 0.9 cm.  Liver  Demonstrates  increased echogenicity consistent with fatty infiltration. No focal lesion or intrahepatic  biliary ductal dilatation is identified.  IVC  No abnormality visualized.  Pancreas  Visualized portion unremarkable.  Spleen  Size and appearance within normal limits.  Right Kidney  Length: Measures 8.8 cm. There is increased cortical echogenicity. No stone, mass or hydronephrosis is identified.  Left Kidney  Length: Measures 9.0 cm. . There is increased cortical echogenicity. No stone, mass or hydronephrosis.  Abdominal aorta  No aneurysm visualized.  IMPRESSION: Single 1.6 cm gallstone without evidence of cholecystitis.  Fatty infiltration of the liver.  Findings compatible with medical renal disease.  Mildly prominent common bile duct without frank dilatation. Negative for intrahepatic biliary ductal dilatation.   Electronically Signed   By: Inge Rise M.D.   On: 04/10/2013 07:21      Assessment/Plan: Sarah Colon is a 67 y.o. female withapparent gout, and 1/2 cultures with  Coag Neg Staph and GNCBacilli. She also has transaminases elevated with alkphosph     # 1 Positive culture with Coag Neg staph and GNCB in 1/2 blood cultures can be interpreted with confidence as being a contaminant here, no need for abx   #2 Gout: continue current management  #3 ARF: likely due to dehydation   #4 CPK up: likely due to ehr acute illness and poor po intake   #5 Elevated LFTs: not sure of cause, RUQ US shows one GS  #6 Screening: HIV negative  I will sign off  Please call with further questions.     LOS: 3 days   Alcide Evener 04/10/2013, 5:27 PM

## 2013-04-10 NOTE — Progress Notes (Signed)
Utilization review completed.  

## 2013-04-10 NOTE — Progress Notes (Signed)
NASTEHO URAM  MRN: [id number] DOB/Age: Mar 01, 1946 67 y.o. Physician: Ander Slade, M.D.      Subjective: Reports feeling better today, although states legs feel as though they are "burning" but also "feel cold". Vital Signs Temp:  [98 F (36.7 C)-98.6 F (37 C)] 98 F (36.7 C) (11/11 0419) Pulse Rate:  [80-88] 80 (11/11 0419) Resp:  [18] 18 (11/11 0419) BP: (140-165)/(65-72) 140/65 mmHg (11/11 0419) SpO2:  [100 %] 100 % (11/11 0419) Weight:  [56.3 kg (124 lb 1.9 oz)] 56.3 kg (124 lb 1.9 oz) (11/10 2111)  Lab Results  Recent Labs  04/09/13 0610 04/10/13 0529  WBC 18.2* 14.1*  HGB 11.5* 11.9*  HCT 31.6* 33.5*  PLT 572* 648*   BMET  Recent Labs  04/08/13 0230 04/09/13 0610  NA 138  136 142  K 3.4*  3.4* 4.0  CL 99  98 108  CO2 26  26 22   GLUCOSE 157*  159* 126*  BUN 35*  34* 24*  CREATININE 1.17*  1.16* 0.97  CALCIUM 9.3  9.1 9.0   INR  Date Value Range Status  04/08/2013 1.23  0.00 - 1.49 Final     Exam  Appears much more comfortable, legs with less swelling and much less tenderness. 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 Will sign off, call prn. Elowyn Raupp M 04/10/2013, 10:49 AM

## 2013-04-11 DIAGNOSIS — K7689 Other specified diseases of liver: Secondary | ICD-10-CM

## 2013-04-11 DIAGNOSIS — K760 Fatty (change of) liver, not elsewhere classified: Secondary | ICD-10-CM | POA: Diagnosis present

## 2013-04-11 DIAGNOSIS — M109 Gout, unspecified: Secondary | ICD-10-CM

## 2013-04-11 LAB — COMPREHENSIVE METABOLIC PANEL
ALT: 319 U/L — ABNORMAL HIGH (ref 0–35)
Alkaline Phosphatase: 273 U/L — ABNORMAL HIGH (ref 39–117)
BUN: 26 mg/dL — ABNORMAL HIGH (ref 6–23)
CO2: 24 mEq/L (ref 19–32)
Chloride: 107 mEq/L (ref 96–112)
GFR calc Af Amer: 63 mL/min — ABNORMAL LOW (ref >90)
Glucose, Bld: 136 mg/dL — ABNORMAL HIGH (ref 70–99)
Potassium: 4.5 mEq/L (ref 3.5–5.1)
Sodium: 141 mEq/L (ref 135–145)
Total Bilirubin: 0.3 mg/dL (ref 0.3–1.2)
Total Protein: 6.9 g/dL (ref 6.0–8.3)

## 2013-04-11 LAB — GLUCOSE, CAPILLARY
Glucose-Capillary: 132 mg/dL — ABNORMAL HIGH (ref 70–99)
Glucose-Capillary: 141 mg/dL — ABNORMAL HIGH (ref 70–99)
Glucose-Capillary: 146 mg/dL — ABNORMAL HIGH (ref 70–99)

## 2013-04-11 NOTE — Progress Notes (Addendum)
[facility] PROGRESS NOTE  EVOLETTE LASSWELL [id number] DOB: Jan 15, 1946 DOA: 04/07/2013 PCP: Salena Saner., MD  Brief narrative  73 F with HTN, DM, arthritis presented with pain over her bilateral knees and ankles for 4 days. Patient septic with fever of 101F, leucocytosis and tachycardia. Elevated ESR and CRP. Moderate effusion over left knee joint which was tapped by ortho and shows wbc of 12k with MSU crystals.    Assessment/Plan:  Sepsis secondary to  acute polyarticular gouty arthritis  Added empiric IV vancomycin and cefepime on admission for possible septic arthritis given fever and leukocytosis... Noted elevated ESR, CRP and CPK.. 1/2 blood cx on admission growing GPC and GN coccobacilli. Likely contaminant P8 antibiotic discontinued on 11/11 -Left Knee joint tapped by orthopedics . Fluid showing wbc of 12k and MSU. cx so far no growth. Elevated uric acid level. placed her on naproxen bid. Joint swelling has resolved. Continue pain control.  2-D echo negative for vegetation. HIV antibody negative -Appreciate orthopedics and ID consult.   Transaminitis  liver US shows fatty liver.. hepatitis panel negative. Elevated CPK on admission. UA unremarkable  LFTs and CPK improving. Recheck in am.   Diabetes mellitus  Continue sliding scale   Hypertension  Blood pressure stable   Hypokalemia  replenished   generalized weakness  Patient has residual leg weakness likely from being bedbound for almost 2 weeks. -Seen by PT and recommends skilled nursing facility. Social worker aware.  DVT prophylaxis: Subcutaneous heparin  Code Status: Full code  Family Communication: None at bedside  Disposition: d/c to rehab on 11/13 if bed available  Consultants:  Dr Onnie Graham ( ortho)  ID    Procedures:  Left knee joint aspiration   Antibiotics:  IV vancomycin and ceftriaxone ( 11/9>>11/11)   HPI/Subjective: Patient overall feels better.complains of some pain over her  legs. Swelling has resolved. He maintained afebrile  Objective: Filed Vitals:   04/11/13 0947  BP: 124/55  Pulse: 78  Temp: 98 F (36.7 C)  Resp: 18    Intake/Output Summary (Last 24 hours) at 04/11/13 1235 Last data filed at 04/11/13 0900  Gross per 24 hour  Intake   1860 ml  Output      0 ml  Net   1860 ml   Filed Weights   04/08/13 2004 04/09/13 2111 04/10/13 2044  Weight: 56.3 kg (124 lb 1.9 oz) 56.3 kg (124 lb 1.9 oz) 56.3 kg (124 lb 1.9 oz)    Exam:  General:  elderly female in no acute distress  HEENT: No pallor, moist oral mucosa  Chest: Clear to auscultation bilaterally, no added sounds  CVS: Normal S1 and S2, no murmurs or gallop  Abdomen: Soft, nontender, nondistended, bowel sounds present Extremities:swelling over the knee and ankle jts resolved. Has some tenderness. CNS: AAO x3  Data Reviewed: Basic Metabolic Panel:  Recent Labs Lab 04/07/13 2010 04/08/13 0230 04/09/13 0610 04/10/13 1455  NA 140 138  136 142 140  K 4.0 3.4*  3.4* 4.0 4.4  CL 101 99  98 108 107  CO2  --  26  26 22 23   GLUCOSE 116* 157*  159* 126* 118*  BUN 35* 35*  34* 24* 21  CREATININE 1.60* 1.17*  1.16* 0.97 1.17*  CALCIUM  --  9.3  9.1 9.0 8.8   Liver Function Tests:  Recent Labs Lab 04/08/13 0230 04/09/13 0610 04/10/13 1455  AST 139*  137* 179* 152*  ALT 165*  163* 264* 211*  ALKPHOS 190*  193* 218* 209*  BILITOT 0.8  0.8 0.6 0.4  PROT 7.4  7.3 7.1 6.6  ALBUMIN 2.4*  2.3* 2.1* 2.0*   No results found for this basename: LIPASE, AMYLASE,  in the last 168 hours No results found for this basename: AMMONIA,  in the last 168 hours CBC:  Recent Labs Lab 04/07/13 2006 04/07/13 2010 04/08/13 0230 04/09/13 0610 04/10/13 0529  WBC 20.2*  --  18.9* 18.2* 14.1*  NEUTROABS 16.4*  --   --   --   --   HGB 12.7 13.6 11.9* 11.5* 11.9*  HCT 34.6* 40.0 32.7* 31.6* 33.5*  MCV 81.6  --  83.4 81.7 82.7  PLT 539*  --  516* 572* 648*   Cardiac Enzymes:  Recent  Labs Lab 04/08/13 0230 04/09/13 0610  CKTOTAL 1567* 440*   BNP (last 3 results) No results found for this basename: PROBNP,  in the last 8760 hours CBG:  Recent Labs Lab 04/10/13 1300 04/10/13 1723 04/10/13 2044 04/11/13 0757 04/11/13 1134  GLUCAP 166* 94 154* 148* 132*    Recent Results (from the past 240 hour(s))  CULTURE, BLOOD (ROUTINE X 2)     Status: None   Collection Time    04/07/13 11:00 PM      Result Value Range Status   Specimen Description BLOOD RIGHT ARM   Final   Special Requests BOTTLES DRAWN AEROBIC AND ANAEROBIC 10CC EACH   Final   Culture  Setup Time     Final   Value: 04/08/2013 13:19     THE SIGNIFICANCE OF ISOLATING THIS ORGANISM FROM A SINGLE SET OF BLOOD CULTURES WHEN MULTIPLE SETS ARE DRAWN IS UNCERTAIN. PLEASE NOTIFY THE MICROBIOLOGY DEPARTMENT WITHIN ONE WEEK IF SPECIATION AND SENSITIVITIES ARE REQUIRED.     Performed at [facility]     Final   Value: GRAM NEGATIVE COCCOBACILLI     STAPHYLOCOCCUS SPECIES (COAGULASE NEGATIVE)     Note: Gram Stain Report Called to,Read Back By and Verified With: NIKKI MURPHY 04/08/2013 [place]     Performed at [facility]   Report Status PENDING   Incomplete  CULTURE, BLOOD (ROUTINE X 2)     Status: None   Collection Time    04/07/13 11:04 PM      Result Value Range Status   Specimen Description BLOOD RIGHT HAND   Final   Special Requests BOTTLES DRAWN AEROBIC ONLY 2CC   Final   Culture  Setup Time     Final   Value: 04/08/2013 13:19     Performed at [facility]   Culture     Final   Value:        BLOOD CULTURE RECEIVED NO GROWTH TO DATE CULTURE WILL BE HELD FOR 5 DAYS BEFORE ISSUING A FINAL NEGATIVE REPORT     Performed at [facility]   Report Status PENDING   Incomplete  BODY FLUID CULTURE     Status: None   Collection Time    04/08/13  1:00 PM      Result Value Range Status   Specimen Description FLUID SYNOVIAL LEFT KNEE   Final   Special Requests PATIENT ON  FOLLOWING VANCO   Final   Gram Stain     Final   Value: MODERATE WBC PRESENT, PREDOMINANTLY MONONUCLEAR     NO ORGANISMS SEEN     Performed at [facility]   Culture     Final   Value: NO GROWTH  3 DAYS     Performed at [facility]   Report Status PENDING   Incomplete  ANAEROBIC CULTURE     Status: None   Collection Time    04/08/13  1:00 PM      Result Value Range Status   Specimen Description FLUID SYNOVIAL LEFT KNEE   Final   Special Requests Immunocompromised   Final   Gram Stain     Final   Value: MODERATE WBC PRESENT,BOTH PMN AND MONONUCLEAR     NO ORGANISMS SEEN     Performed at [facility]   Culture     Final   Value: NO ANAEROBES ISOLATED; CULTURE IN PROGRESS FOR 5 DAYS     Performed at [facility]   Report Status PENDING   Incomplete     Studies: US Abdomen Complete  04/10/2013   CLINICAL DATA:  Elevated liver function tests.  EXAM: ULTRASOUND ABDOMEN COMPLETE  COMPARISON:  None.  FINDINGS: Gallbladder  A single mobile 1.6 cm stone is seen in the gallbladder. There is no gallbladder wall thickening or pericholecystic fluid. Sonographer reports negative Murphy's sign.  Common bile duct  Diameter: Measures 0.9 cm.  Liver  Demonstrates increased echogenicity consistent with fatty infiltration. No focal lesion or intrahepatic biliary ductal dilatation is identified.  IVC  No abnormality visualized.  Pancreas  Visualized portion unremarkable.  Spleen  Size and appearance within normal limits.  Right Kidney  Length: Measures 8.8 cm. There is increased cortical echogenicity. No stone, mass or hydronephrosis is identified.  Left Kidney  Length: Measures 9.0 cm. . There is increased cortical echogenicity. No stone, mass or hydronephrosis.  Abdominal aorta  No aneurysm visualized.  IMPRESSION: Single 1.6 cm gallstone without evidence of cholecystitis.  Fatty infiltration of the liver.  Findings compatible with medical renal disease.  Mildly prominent  common bile duct without frank dilatation. Negative for intrahepatic biliary ductal dilatation.   Electronically Signed   By: Inge Rise M.D.   On: 04/10/2013 07:21    Scheduled Meds: . feeding supplement (GLUCERNA SHAKE)  237 mL Oral TID BM  . heparin  5,000 Units Subcutaneous Q8H  . insulin aspart  0-15 Units Subcutaneous TID WC  . insulin aspart  0-5 Units Subcutaneous QHS  . naproxen  500 mg Oral BID WC   Continuous Infusions: . sodium chloride 100 mL/hr at 04/09/13 1516      Time spent: 25 minutes    Emoni Whitworth, [facility] Pager [phone] If 7PM-7AM, please contact night-coverage at [email or web address], password [facility] 04/11/2013, 12:35 PM  LOS: 4 days

## 2013-04-11 NOTE — Clinical Social Work Note (Signed)
CSW rec'd consult regarding SNF placement for patient and patient assessed (full assessment to follow). Patient in agreement and given bed offers at 5:14 pm. CSW will follow-up with patient Thursday morning re: her SNF choice.  Madora Barletta Givens, MSW, LCSW [phone]

## 2013-04-11 NOTE — ED Provider Notes (Signed)
Medical screening examination/treatment/procedure(s) were conducted as a shared visit with non-physician practitioner(s) and myself.  I personally evaluated the patient during the encounter.  EKG Interpretation     Ventricular Rate:    PR Interval:    QRS Duration:   QT Interval:    QTC Calculation:   R Axis:     Text Interpretation:              Admit for arthralgias, fever, wbc 20k.  Pt stable in ER.    Elmer Sow, MD 04/11/13 2117

## 2013-04-12 DIAGNOSIS — I5032 Chronic diastolic (congestive) heart failure: Secondary | ICD-10-CM

## 2013-04-12 LAB — PRO B NATRIURETIC PEPTIDE: Pro B Natriuretic peptide (BNP): 1377 pg/mL — ABNORMAL HIGH (ref 0–125)

## 2013-04-12 LAB — CBC
HCT: 29.5 % — ABNORMAL LOW (ref 36.0–46.0)
Hemoglobin: 10.3 g/dL — ABNORMAL LOW (ref 12.0–15.0)
MCH: 28.9 pg (ref 26.0–34.0)
MCV: 82.6 fL (ref 78.0–100.0)
RBC: 3.57 MIL/uL — ABNORMAL LOW (ref 3.87–5.11)
RDW: 14.2 % (ref 11.5–15.5)

## 2013-04-12 LAB — BODY FLUID CULTURE

## 2013-04-12 LAB — GLUCOSE, CAPILLARY
Glucose-Capillary: 130 mg/dL — ABNORMAL HIGH (ref 70–99)
Glucose-Capillary: 172 mg/dL — ABNORMAL HIGH (ref 70–99)
Glucose-Capillary: 200 mg/dL — ABNORMAL HIGH (ref 70–99)

## 2013-04-12 MED ORDER — GABAPENTIN 600 MG PO TABS
600.0000 mg | ORAL_TABLET | Freq: Two times a day (BID) | ORAL | Status: DC
  Administered 2013-04-12 – 2013-04-17 (×10): 600 mg via ORAL
  Filled 2013-04-12 (×11): qty 1

## 2013-04-12 MED ORDER — FUROSEMIDE 10 MG/ML IJ SOLN
20.0000 mg | Freq: Two times a day (BID) | INTRAMUSCULAR | Status: DC
  Administered 2013-04-12 – 2013-04-13 (×2): 20 mg via INTRAVENOUS
  Filled 2013-04-12 (×5): qty 2

## 2013-04-12 MED ORDER — OXYCODONE HCL 5 MG PO TABS
5.0000 mg | ORAL_TABLET | Freq: Four times a day (QID) | ORAL | Status: DC | PRN
  Administered 2013-04-12 – 2013-04-17 (×10): 5 mg via ORAL
  Filled 2013-04-12 (×10): qty 1

## 2013-04-12 NOTE — Progress Notes (Signed)
[facility] PROGRESS NOTE  Sarah Colon [id number] DOB: [birth year] DOA: 04/07/2013 PCP: Salena Saner., MD  Brief narrative  51 F with HTN, DM, arthritis presented with pain over her bilateral knees and ankles for 4 days. Patient septic with fever of 101F, leucocytosis and tachycardia. Elevated ESR and CRP. Moderate effusion over left knee joint which was tapped by ortho and shows wbc of 12k with MSU crystals.    Assessment/Plan:  Sepsis secondary to  acute polyarticular gouty arthritis  Added empiric IV vancomycin and cefepime on admission for possible septic arthritis given fever and leukocytosis... Noted elevated ESR, CRP and CPK.. 1/2 blood cx on admission growing GPC and GN coccobacilli. Likely contaminant P8 antibiotic discontinued on 11/11 -Left Knee joint tapped by orthopedics . Fluid showing wbc of 12k and MSU. cx so far no growth. Elevated uric acid level. placed her on naproxen bid. Joint swelling has resolved. Continue pain control.  2-D echo negative for vegetation. HIV antibody negative -Appreciate orthopedics and ID consult.  White blood cell count slightly elevated today. No fever. Checking UA and also BNP  Chronic diastolic heart failure: Elevated white blood cell count could be volume overload. Checking BNP  Transaminitis  liver US shows fatty liver.. hepatitis panel negative. Elevated CPK on admission. UA unremarkable  LFTs and CPK improving. Could be secondary hepatic congestion from volume overload see above.  Diabetes mellitus  Continue sliding scale   Hypertension  Blood pressure stable   Hypokalemia  replenished   generalized weakness  Patient has residual leg weakness likely from being bedbound for almost 2 weeks. -Seen by PT and recommends skilled nursing facility. Social worker aware. 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 Have restarted her Neurontin  DVT prophylaxis: Subcutaneous heparin  Code Status: Full code  Family  Communication: Patient to me not to call her daughter Disposition: DC the skilled nursing, possibly tomorrow if white blood cell count improved  Consultants:  Dr Onnie Graham ( ortho)  ID    Procedures:  Left knee joint aspiration   Antibiotics:  IV vancomycin and ceftriaxone ( 11/9>>11/11)   HPI/Subjective: Patient complaint is of bilateral burning leg pain from the knees down. She's had this chronically and it seems to be flaring up as of the last few days  Objective: Filed Vitals:   04/12/13 0431  BP: 156/65  Pulse: 77  Temp: 98.9 F (37.2 C)  Resp: 18    Intake/Output Summary (Last 24 hours) at 04/12/13 0750 Last data filed at 04/12/13 0500  Gross per 24 hour  Intake   4660 ml  Output      0 ml  Net   4660 ml   Filed Weights   04/09/13 2111 04/10/13 2044 04/11/13 2021  Weight: 56.3 kg (124 lb 1.9 oz) 56.3 kg (124 lb 1.9 oz) 56.3 kg (124 lb 1.9 oz)    Exam:  General:  elderly female in no acute distress  HEENT: No pallor, moist oral mucosa  Chest: Clear to auscultation bilaterally, no added sounds  CVS: Regular rate and rhythm, S1-S2 Abdomen: Soft, nontender, nondistended, bowel sounds present Extremities:swelling over the knee and ankle jts resolved. Has some tenderness. CNS: AAO x3  Data Reviewed: Basic Metabolic Panel:  Recent Labs Lab 04/07/13 2010 04/08/13 0230 04/09/13 0610 04/10/13 1455 04/11/13 1300  NA 140 138  136 142 140 141  K 4.0 3.4*  3.4* 4.0 4.4 4.5  CL 101 99  98 108 107 107  CO2  --  26  26 22  23 24  GLUCOSE 116* 157*  159* 126* 118* 136*  BUN 35* 35*  34* 24* 21 26*  CREATININE 1.60* 1.17*  1.16* 0.97 1.17* 1.03  CALCIUM  --  9.3  9.1 9.0 8.8 9.1   Liver Function Tests:  Recent Labs Lab 04/08/13 0230 04/09/13 0610 04/10/13 1455 04/11/13 1300  AST 139*  137* 179* 152* 260*  ALT 165*  163* 264* 211* 319*  ALKPHOS 190*  193* 218* 209* 273*  BILITOT 0.8  0.8 0.6 0.4 0.3  PROT 7.4  7.3 7.1 6.6 6.9  ALBUMIN 2.4*   2.3* 2.1* 2.0* 2.4*   No results found for this basename: LIPASE, AMYLASE,  in the last 168 hours No results found for this basename: AMMONIA,  in the last 168 hours CBC:  Recent Labs Lab 04/07/13 2006 04/07/13 2010 04/08/13 0230 04/09/13 0610 04/10/13 0529  WBC 20.2*  --  18.9* 18.2* 14.1*  NEUTROABS 16.4*  --   --   --   --   HGB 12.7 13.6 11.9* 11.5* 11.9*  HCT 34.6* 40.0 32.7* 31.6* 33.5*  MCV 81.6  --  83.4 81.7 82.7  PLT 539*  --  516* 572* 648*   Cardiac Enzymes:  Recent Labs Lab 04/08/13 0230 04/09/13 0610  CKTOTAL 1567* 440*   BNP (last 3 results) No results found for this basename: PROBNP,  in the last 8760 hours CBG:  Recent Labs Lab 04/11/13 0757 04/11/13 1134 04/11/13 1632 04/11/13 2024 04/12/13 0724  GLUCAP 148* 132* 141* 146* 130*    Recent Results (from the past 240 hour(s))  CULTURE, BLOOD (ROUTINE X 2)     Status: None   Collection Time    04/07/13 11:00 PM      Result Value Range Status   Specimen Description BLOOD RIGHT ARM   Final   Special Requests BOTTLES DRAWN AEROBIC AND ANAEROBIC 10CC EACH   Final   Culture  Setup Time     Final   Value: 04/08/2013 13:19     THE SIGNIFICANCE OF ISOLATING THIS ORGANISM FROM A SINGLE SET OF BLOOD CULTURES WHEN MULTIPLE SETS ARE DRAWN IS UNCERTAIN. PLEASE NOTIFY THE MICROBIOLOGY DEPARTMENT WITHIN ONE WEEK IF SPECIATION AND SENSITIVITIES ARE REQUIRED.     Performed at [facility]     Final   Value: GRAM NEGATIVE COCCOBACILLI     STAPHYLOCOCCUS SPECIES (COAGULASE NEGATIVE)     Note: Gram Stain Report Called to,Read Back By and Verified With: NIKKI MURPHY 04/08/2013 [place]     Performed at [facility]   Report Status PENDING   Incomplete  CULTURE, BLOOD (ROUTINE X 2)     Status: None   Collection Time    04/07/13 11:04 PM      Result Value Range Status   Specimen Description BLOOD RIGHT HAND   Final   Special Requests BOTTLES DRAWN AEROBIC ONLY 2CC   Final   Culture   Setup Time     Final   Value: 04/08/2013 13:19     Performed at [facility]   Culture     Final   Value:        BLOOD CULTURE RECEIVED NO GROWTH TO DATE CULTURE WILL BE HELD FOR 5 DAYS BEFORE ISSUING A FINAL NEGATIVE REPORT     Performed at [facility]   Report Status PENDING   Incomplete  BODY FLUID CULTURE     Status: None   Collection Time  04/08/13  1:00 PM      Result Value Range Status   Specimen Description FLUID SYNOVIAL LEFT KNEE   Final   Special Requests PATIENT ON FOLLOWING VANCO   Final   Gram Stain     Final   Value: MODERATE WBC PRESENT, PREDOMINANTLY MONONUCLEAR     NO ORGANISMS SEEN     Performed at [facility]   Culture     Final   Value: NO GROWTH 3 DAYS     Performed at [facility]   Report Status PENDING   Incomplete  ANAEROBIC CULTURE     Status: None   Collection Time    04/08/13  1:00 PM      Result Value Range Status   Specimen Description FLUID SYNOVIAL LEFT KNEE   Final   Special Requests Immunocompromised   Final   Gram Stain     Final   Value: MODERATE WBC PRESENT,BOTH PMN AND MONONUCLEAR     NO ORGANISMS SEEN     Performed at [facility]   Culture     Final   Value: NO ANAEROBES ISOLATED; CULTURE IN PROGRESS FOR 5 DAYS     Performed at [facility]   Report Status PENDING   Incomplete     Studies: No results found.  Scheduled Meds: . feeding supplement (GLUCERNA SHAKE)  237 mL Oral TID BM  . heparin  5,000 Units Subcutaneous Q8H  . insulin aspart  0-15 Units Subcutaneous TID WC  . insulin aspart  0-5 Units Subcutaneous QHS  . naproxen  500 mg Oral BID WC   Continuous Infusions: . sodium chloride 100 mL/hr at 04/09/13 1516      Time spent: 25 minutes    [facility] Pager [phone] If 7PM-7AM, please contact night-coverage at [email or web address], password [facility] 04/12/2013, 7:50 AM  LOS: 5 days

## 2013-04-12 NOTE — Clinical Social Work Placement (Addendum)
Clinical Social Work Department CLINICAL SOCIAL WORK PLACEMENT NOTE 04/12/2013  Patient:  CAITRIONA, WANNER  Account Number:  [id number] Admit date:  04/07/2013  Clinical Social Worker:  Jamorion Gomillion Givens, LCSW  Date/time:  04/12/2013 11:36 AM  Clinical Social Work is seeking post-discharge placement for this patient at the following level of care:   [place]   (*CSW will update this form in Epic as items are completed)   04/11/2013  Patient/family provided with [place] Department of Clinical Social Work's list of facilities offering this level of care within the geographic area requested by the patient (or if unable, by the patient's family).  04/11/2013  Patient/family informed of their freedom to choose among providers that offer the needed level of care, that participate in Medicare, Medicaid or managed care program needed by the patient, have an available bed and are willing to accept the patient.    Patient/family informed of [facility]' ownership interest in [facility], as well as of the fact that they are under no obligation to receive care at this facility.  PASARR submitted to EDS on 04/12/2013 PASARR number received from EDS on 04/12/2013  FL2 transmitted to all facilities in geographic area requested by pt/family on  04/12/2013 FL2 transmitted to all facilities within larger geographic area on   Patient informed that his/her managed care company has contracts with or will negotiate with  certain facilities, including the following:     Patient/family informed of bed offers received:  04/12/2013 Patient chooses bed at [place] Physician recommends and patient chooses bed at    Patient to be transferred to [facility] on 04/17/13  Patient to be transferred to facility by ambulance  The following physician request were entered in Epic:  Additional Comments: 04/13/13 - CSW rec'd call from [place],  admissions director at [facility] advising that they could not take patient today as they were having water issues and not accepting patients. CSW talked with patient about having to select another facility if MD clears her for discharge and patient agreeable. 04/13/13 - CSW advised by MD late afternoon that patient not medically stable for discharge today. Patient will remain in hospital through the weekend.

## 2013-04-12 NOTE — Clinical Social Work Psychosocial (Signed)
Clinical Social Work Department BRIEF PSYCHOSOCIAL ASSESSMENT 04/12/2013  Patient:  Sarah Colon, Sarah Colon     Account Number:  [id number]     Admit date:  04/07/2013  Clinical Social Worker:  Frederico Hamman  Date/Time:  04/12/2013 11:38 AM  Referred by:  Physician  Date Referred:  04/11/2013 Referred for  SNF Placement   Other Referral:   Interview type:  Patient Other interview type:   CSW also talked with patient's daughter Sarah Colon [phone]) by phone.    PSYCHOSOCIAL DATA Living Status:  ALONE Admitted from facility:   Level of care:   Primary support name:  Sarah Colon Primary support relationship to patient:  CHILD, ADULT Degree of support available:    CURRENT CONCERNS Current Concerns  Post-Acute Placement   Other Concerns:    SOCIAL WORK ASSESSMENT / PLAN On 04/11/13 CSW talked with patient regarding discharge planning and MD's recommendation of ST rehab. Patient expressed that she is weak and in pain, and can benefit from rehab, however she does not want to stay long. CSW explained SNF search process and provided patient with a skilled facility list for [facility].    Patient gave permission for daughter to be contacted re: SNF placement for rehab. CSW spoke with Ms. Sarah Colon by phone regarding ST rehab and she also is in agreement. Daughter had questions about Assisted Living and also feels that patient needs to downsize from a 2 bedroom to 1 bedroom apartment due to her problems with mobility.   Assessment/plan status:  Psychosocial Support/Ongoing Assessment of Needs Other assessment/ plan:   Information/referral to community resources:   SNF list for [facility] given on 04/11/13    PATIENT'S/FAMILY'S RESPONSE TO PLAN OF CARE: Patient receptive to talking with CSW on 11/12 and agreeable to short-term rehab.

## 2013-04-13 DIAGNOSIS — I5033 Acute on chronic diastolic (congestive) heart failure: Secondary | ICD-10-CM | POA: Diagnosis present

## 2013-04-13 DIAGNOSIS — D72829 Elevated white blood cell count, unspecified: Secondary | ICD-10-CM | POA: Diagnosis present

## 2013-04-13 LAB — BASIC METABOLIC PANEL
BUN: 22 mg/dL (ref 6–23)
CO2: 24 mEq/L (ref 19–32)
Calcium: 8.7 mg/dL (ref 8.4–10.5)
Chloride: 112 mEq/L (ref 96–112)
Creatinine, Ser: 0.98 mg/dL (ref 0.50–1.10)
GFR calc Af Amer: 67 mL/min — ABNORMAL LOW (ref >90)
Glucose, Bld: 133 mg/dL — ABNORMAL HIGH (ref 70–99)
Potassium: 4.2 mEq/L (ref 3.5–5.1)

## 2013-04-13 LAB — GLUCOSE, CAPILLARY
Glucose-Capillary: 131 mg/dL — ABNORMAL HIGH (ref 70–99)
Glucose-Capillary: 149 mg/dL — ABNORMAL HIGH (ref 70–99)
Glucose-Capillary: 115 mg/dL — ABNORMAL HIGH (ref 70–99)

## 2013-04-13 LAB — ANAEROBIC CULTURE

## 2013-04-13 LAB — CBC
HCT: 29.5 % — ABNORMAL LOW (ref 36.0–46.0)
Hemoglobin: 10.3 g/dL — ABNORMAL LOW (ref 12.0–15.0)
MCH: 29.1 pg (ref 26.0–34.0)
MCHC: 34.9 g/dL (ref 30.0–36.0)
MCV: 83.3 fL (ref 78.0–100.0)
Platelets: 654 10*3/uL — ABNORMAL HIGH (ref 150–400)
RBC: 3.54 MIL/uL — ABNORMAL LOW (ref 3.87–5.11)
RDW: 14.4 % (ref 11.5–15.5)
WBC: 15.6 10*3/uL — ABNORMAL HIGH (ref 4.0–10.5)

## 2013-04-13 LAB — HEMOGLOBIN A1C
Hgb A1c MFr Bld: 6.5 % — ABNORMAL HIGH (ref <5.7)
Mean Plasma Glucose: 140 mg/dL — ABNORMAL HIGH (ref <117)

## 2013-04-13 MED ORDER — LEVOFLOXACIN IN D5W 500 MG/100ML IV SOLN: 500.0000 mg | INTRAVENOUS | Status: DC

## 2013-04-13 MED ORDER — FUROSEMIDE 10 MG/ML IJ SOLN
40.0000 mg | Freq: Two times a day (BID) | INTRAMUSCULAR | Status: DC
  Administered 2013-04-13: 40 mg via INTRAVENOUS
  Filled 2013-04-13 (×3): qty 4

## 2013-04-13 MED ORDER — DEXTROSE 5 % IV SOLN
1.0000 g | Freq: Four times a day (QID) | INTRAVENOUS | Status: DC
  Administered 2013-04-13 – 2013-04-15 (×7): 1 g via INTRAVENOUS
  Filled 2013-04-13 (×9): qty 1000

## 2013-04-13 MED ORDER — NAFCILLIN SODIUM 1 G IJ SOLR: 500.0000 mg | Freq: Four times a day (QID) | INTRAVENOUS | Status: DC

## 2013-04-13 MED ORDER — LEVOFLOXACIN IN D5W 750 MG/150ML IV SOLN
750.0000 mg | INTRAVENOUS | Status: DC
  Administered 2013-04-13: 750 mg via INTRAVENOUS
  Filled 2013-04-13 (×2): qty 150

## 2013-04-13 NOTE — Progress Notes (Signed)
Physical Therapy Treatment Patient Details Name: REENE MCKINLAY MRN: [id number] DOB: 07-11-1945 Today's Date: 04/13/2013 Time: [id number] PT Time Calculation (min): 24 min  PT Assessment / Plan / Recommendation  History of Present Illness MIAJA NORCOTT is a 67 y.o. female with Past medical history of arthritis, hypertension, diabetes, skin grafting.  She is admitted with about a week of progressive leg pain and weakness to the point that she was unable to ambulate.  Nursing report that pt has had multiple episodes of incontinence today and pt reports she is unable to control it  Pt was found to have sepsis possiby due to septic arthritis    PT Comments   Pt motivated to participate in therapy today. Was able to ambulate minimally secondary to dizziness. BP 179/72 after ambulating. Pt requires 2+ for ambulation to maintain balance. Pt awaiting [facility] at [facility]. Will cont to follow per POC.  Follow Up Recommendations  SNF     Does the patient have the potential to tolerate intense rehabilitation     Barriers to Discharge        Equipment Recommendations  Rolling walker with 5" wheels    Recommendations for Other Services OT consult  Frequency Min 3X/week   Progress towards PT Goals Progress towards PT goals: Progressing toward goals  Plan Current plan remains appropriate    Precautions / Restrictions Precautions Precautions: Fall Restrictions Weight Bearing Restrictions: No   Pertinent Vitals/Pain 10/10  In bil LEs; patient repositioned for comfort     Mobility  Bed Mobility Bed Mobility: Supine to Sit;Sitting - Scoot to Edge of Bed Supine to Sit: 5: Supervision;HOB elevated;With rails Sitting - Scoot to Edge of Bed: 5: Supervision Details for Bed Mobility Assistance: cues for hand placement and sequencing; requires incr time to complete but was able to independently bring LEs to/off EOB Transfers Transfers: Sit to Stand;Stand to Sit Sit to Stand: 1: +2 Total  assist;From bed;With upper extremity assist Sit to Stand: Patient Percentage: 40% Stand to Sit: 1: +2 Total assist;To chair/3-in-1;With upper extremity assist Stand to Sit: Patient Percentage: 30% Details for Transfer Assistance: pt with difficulty powering up through LEs secondary to pain and decr ability to WB through LEs;benefits from having Lt LE blocked; pt stands with LEs flexed at all times  Ambulation/Gait Ambulation/Gait Assistance: 1: +2 Total assist Ambulation/Gait: Patient Percentage: 70% Ambulation Distance (Feet): 12 Feet Assistive device: Rolling walker Ambulation/Gait Assistance Details: pt unsteady with gt; amb with short shuffled steps and narrow BOS; pt with bil LEs flexed and trunk flexed; cues for sequencing and upright posture; pt became dizzy and limited ambulation  Gait Pattern: Step-to pattern;Shuffle;Narrow base of support;Trunk flexed;Decreased stride length Gait velocity: very decreased General Gait Details: BP 179/72 after ambulating  Stairs: No Wheelchair Mobility Wheelchair Mobility: No    Exercises General Exercises - Lower Extremity Ankle Circles/Pumps: AROM;Both;10 reps;Seated Long Arc Quad: AROM;10 reps;Seated (cues to slow down and control contraction ) Hip Flexion/Marching: AROM;Both;10 reps;Seated   PT Diagnosis:    PT Problem List:   PT Treatment Interventions:     PT Goals (current goals can now be found in the care plan section) Acute Rehab PT Goals Patient Stated Goal: to walk more  PT Goal Formulation: With patient Time For Goal Achievement: 04/24/13 Potential to Achieve Goals: Good  Visit Information  Last PT Received On: 04/13/13 Assistance Needed: +2 History of Present Illness: DANNELY HENDERSON is a 67 y.o. female with Past medical history of arthritis, hypertension, diabetes, skin  grafting.  She is admitted with about a week of progressive leg pain and weakness to the point that she was unable to ambulate.  Nursing report that pt has  had multiple episodes of incontinence today and pt reports she is unable to control it  Pt was found to have sepsis possiby due to septic arthritis     Subjective Data  Subjective: pt lying supine with nurse tech present; agreeable to therapy. states "ive been walking some. i know i need to do more"  Patient Stated Goal: to walk more    Cognition  Cognition Arousal/Alertness: Awake/alert Behavior During Therapy: WFL for tasks assessed/performed Overall Cognitive Status: Within Functional Limits for tasks assessed    Balance  Balance Balance Assessed: Yes Static Sitting Balance Static Sitting - Balance Support: Bilateral upper extremity supported;Feet unsupported Static Sitting - Level of Assistance: 5: Stand by assistance Static Sitting - Comment/# of Minutes: pt tolerated sitting EOB to perform LE exercises   End of Session PT - End of Session Equipment Utilized During Treatment: Gait belt Activity Tolerance: Patient limited by fatigue;Other (comment) (limited by nausea ) Patient left: in chair;with call bell/phone within reach Nurse Communication: Mobility status;Other (comment) (BP )   GP     Gustavus Bryant, [place] [phone] 04/13/2013, 12:14 PM

## 2013-04-13 NOTE — Progress Notes (Signed)
[facility] PROGRESS NOTE  LITTLE CRATER [id number] DOB: 12-10-1945 DOA: 04/07/2013 PCP: Salena Saner., MD  Brief narrative  9 F with HTN, DM, arthritis presented with pain over her bilateral knees and ankles for 4 days. Patient septic with fever of 101F, leucocytosis and tachycardia. Elevated ESR and CRP. Moderate effusion over left knee joint which was tapped by ortho and shows wbc of 12k with MSU crystals.    Assessment/Plan:  Sepsis secondary to  acute polyarticular gouty arthritis  Added empiric IV vancomycin and cefepime on admission for possible septic arthritis given fever and leukocytosis... Noted elevated ESR, CRP and CPK.. 1/2 blood cx on admission growing GPC and GN coccobacilli. Likely contaminant P8 antibiotic discontinued on 11/11 -Left Knee joint tapped by orthopedics . Fluid showing wbc of 12k and MSU. cx so far no growth. Elevated uric acid level. placed her on naproxen bid. Joint swelling has resolved. Continue pain control.  2-D echo negative for vegetation. HIV antibody negative -Appreciate orthopedics and ID consult.  Leukocytosis  The patient's white blood cell count is slightly further elevated today. No fever. Has never really normalized. We'll plan to check CPK and ESR to ensure that these are better. In addition, urinalysis not done so have reordered. She does have some mild diastolic heart failure and Lasix started last night. Continue Lasix and I have increased the dose. In this case it may be volume overload causing mild stress margination.  Acute on Chronic diastolic heart failure: Elevated white blood cell count could be volume overload. Mild elevation with BNP of 1300 so starting IV Lasix  Transaminitis  liver US shows fatty liver.. hepatitis panel negative. Elevated CPK on admission. UA unremarkable  LFTs and CPK improving. Could be secondary hepatic congestion from volume overload see above.  Diabetes mellitus  Continue sliding scale    Hypertension  Blood pressure stable   Hypokalemia  replenished   generalized weakness  Patient has residual leg weakness likely from being bedbound for almost 2 weeks. -Seen by PT and recommends skilled nursing facility. Social worker aware. 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 Have restarted her Neurontin  DVT prophylaxis: Subcutaneous heparin  Code Status: Full code  Family Communication: Patient to me not to call her daughter Disposition: Given persistently elevated white blood cell count and mild heart failure, we'll hold off on discharge today. Possibly tomorrow or Monday  Consultants:  Dr Onnie Graham ( ortho)  ID    Procedures:  Left knee joint aspiration   Antibiotics:  IV vancomycin and ceftriaxone ( 11/9>>11/11)   HPI/Subjective: Having episodes of urinary incontinence, but this may be related to the Lasix yesterday. Still very weak. Burning in legs persists. No focal joint pain.  Objective: Filed Vitals:   04/13/13 1336  BP: 156/93  Pulse: 108  Temp: 98.8 F (37.1 C)  Resp: 18    Intake/Output Summary (Last 24 hours) at 04/13/13 1341 Last data filed at 04/13/13 1336  Gross per 24 hour  Intake   1520 ml  Output      0 ml  Net   1520 ml   Filed Weights   04/10/13 2044 04/11/13 2021 04/12/13 2015  Weight: 56.3 kg (124 lb 1.9 oz) 56.3 kg (124 lb 1.9 oz) 56.3 kg (124 lb 1.9 oz)    Exam:  General:  Alert x3, no acute distress Chest: Clear to auscultation bilaterally, no added sounds  CVS: Regular rate and rhythm, S1-S2 Abdomen: Soft, nontender, nondistended, bowel sounds present Extremities:swelling over the knee and  ankle jts resolved. Has some tenderness.  Data Reviewed: Basic Metabolic Panel:  Recent Labs Lab 04/08/13 0230 04/09/13 0610 04/10/13 1455 04/11/13 1300 04/13/13 0655  NA 138  136 142 140 141 144  K 3.4*  3.4* 4.0 4.4 4.5 4.2  CL 99  98 108 107 107 112  CO2 26  26 22 23 24 24   GLUCOSE 157*  159* 126* 118* 136* 133*   BUN 35*  34* 24* 21 26* 22  CREATININE 1.17*  1.16* 0.97 1.17* 1.03 0.98  CALCIUM 9.3  9.1 9.0 8.8 9.1 8.7   Liver Function Tests:  Recent Labs Lab 04/08/13 0230 04/09/13 0610 04/10/13 1455 04/11/13 1300  AST 139*  137* 179* 152* 260*  ALT 165*  163* 264* 211* 319*  ALKPHOS 190*  193* 218* 209* 273*  BILITOT 0.8  0.8 0.6 0.4 0.3  PROT 7.4  7.3 7.1 6.6 6.9  ALBUMIN 2.4*  2.3* 2.1* 2.0* 2.4*   No results found for this basename: LIPASE, AMYLASE,  in the last 168 hours No results found for this basename: AMMONIA,  in the last 168 hours CBC:  Recent Labs Lab 04/07/13 2006  04/08/13 0230 04/09/13 0610 04/10/13 0529 04/12/13 0845 04/13/13 0655  WBC 20.2*  --  18.9* 18.2* 14.1* 15.0* 15.6*  NEUTROABS 16.4*  --   --   --   --   --   --   HGB 12.7  < > 11.9* 11.5* 11.9* 10.3* 10.3*  HCT 34.6*  < > 32.7* 31.6* 33.5* 29.5* 29.5*  MCV 81.6  --  83.4 81.7 82.7 82.6 83.3  PLT 539*  --  516* 572* 648* 667* 654*  < > = values in this interval not displayed. Cardiac Enzymes:  Recent Labs Lab 04/08/13 0230 04/09/13 0610  CKTOTAL 1567* 440*   BNP (last 3 results)  Recent Labs  04/12/13 1822  PROBNP 1377.0*   CBG:  Recent Labs Lab 04/12/13 1708 04/12/13 2019 04/12/13 2210 04/13/13 0729 04/13/13 1136  GLUCAP 91 200* 148* 131* 149*    Recent Results (from the past 240 hour(s))  CULTURE, BLOOD (ROUTINE X 2)     Status: None   Collection Time    04/07/13 11:00 PM      Result Value Range Status   Specimen Description BLOOD RIGHT ARM   Final   Special Requests BOTTLES DRAWN AEROBIC AND ANAEROBIC 10CC EACH   Final   Culture  Setup Time     Final   Value: 04/08/2013 13:19     THE SIGNIFICANCE OF ISOLATING THIS ORGANISM FROM A SINGLE SET OF BLOOD CULTURES WHEN MULTIPLE SETS ARE DRAWN IS UNCERTAIN. PLEASE NOTIFY THE MICROBIOLOGY DEPARTMENT WITHIN ONE WEEK IF SPECIATION AND SENSITIVITIES ARE REQUIRED.     Performed at [facility]     Final    Value: GRAM NEGATIVE COCCOBACILLI     STAPHYLOCOCCUS SPECIES (COAGULASE NEGATIVE)     Note: Gram Stain Report Called to,Read Back By and Verified With: NIKKI MURPHY 04/08/2013 [place]     Performed at [facility]   Report Status PENDING   Incomplete  CULTURE, BLOOD (ROUTINE X 2)     Status: None   Collection Time    04/07/13 11:04 PM      Result Value Range Status   Specimen Description BLOOD RIGHT HAND   Final   Special Requests BOTTLES DRAWN AEROBIC ONLY [place]   Final   Culture  Setup Time  Final   Value: 04/08/2013 13:19     Performed at [facility]   Culture     Final   Value:        BLOOD CULTURE RECEIVED NO GROWTH TO DATE CULTURE WILL BE HELD FOR 5 DAYS BEFORE ISSUING A FINAL NEGATIVE REPORT     Performed at [facility]   Report Status PENDING   Incomplete  BODY FLUID CULTURE     Status: None   Collection Time    04/08/13  1:00 PM      Result Value Range Status   Specimen Description FLUID SYNOVIAL LEFT KNEE   Final   Special Requests PATIENT ON FOLLOWING VANCO   Final   Gram Stain     Final   Value: MODERATE WBC PRESENT, PREDOMINANTLY MONONUCLEAR     NO ORGANISMS SEEN     Performed at [facility]   Culture     Final   Value: NO GROWTH 3 DAYS     Performed at [facility]   Report Status 04/12/2013 FINAL   Final  ANAEROBIC CULTURE     Status: None   Collection Time    04/08/13  1:00 PM      Result Value Range Status   Specimen Description FLUID SYNOVIAL LEFT KNEE   Final   Special Requests Immunocompromised   Final   Gram Stain     Final   Value: MODERATE WBC PRESENT,BOTH PMN AND MONONUCLEAR     NO ORGANISMS SEEN     Performed at [facility]   Culture     Final   Value: NO ANAEROBES ISOLATED     Performed at [facility]   Report Status 04/13/2013 FINAL   Final     Studies: No results found.  Scheduled Meds: . feeding supplement (GLUCERNA SHAKE)  237 mL Oral TID BM  . furosemide  40 mg Intravenous  Q12H  . gabapentin  600 mg Oral BID  . heparin  5,000 Units Subcutaneous Q8H  . insulin aspart  0-15 Units Subcutaneous TID WC  . insulin aspart  0-5 Units Subcutaneous QHS  . naproxen  500 mg Oral BID WC   Continuous Infusions:      Time spent: 30 minutes    [facility] Pager [phone] If 7PM-7AM, please contact night-coverage at [email or web address], password [facility] 04/13/2013, 1:41 PM  LOS: 6 days

## 2013-04-14 ENCOUNTER — Inpatient Hospital Stay (HOSPITAL_COMMUNITY): Payer: Medicare Other

## 2013-04-14 DIAGNOSIS — K5900 Constipation, unspecified: Secondary | ICD-10-CM

## 2013-04-14 LAB — CBC WITH DIFFERENTIAL/PLATELET
Basophils Absolute: 0.1 10*3/uL (ref 0.0–0.1)
Basophils Relative: 0 % (ref 0–1)
Eosinophils Absolute: 0.2 10*3/uL (ref 0.0–0.7)
Eosinophils Relative: 1 % (ref 0–5)
HCT: 31.7 % — ABNORMAL LOW (ref 36.0–46.0)
Lymphocytes Relative: 20 % (ref 12–46)
Lymphs Abs: 3.6 10*3/uL (ref 0.7–4.0)
MCH: 29.3 pg (ref 26.0–34.0)
MCV: 83.6 fL (ref 78.0–100.0)
Monocytes Absolute: 1.3 10*3/uL — ABNORMAL HIGH (ref 0.1–1.0)
Monocytes Relative: 7 % (ref 3–12)
RBC: 3.79 MIL/uL — ABNORMAL LOW (ref 3.87–5.11)
WBC: 17.6 10*3/uL — ABNORMAL HIGH (ref 4.0–10.5)

## 2013-04-14 LAB — BASIC METABOLIC PANEL
CO2: 30 mEq/L (ref 19–32)
Chloride: 103 mEq/L (ref 96–112)
Creatinine, Ser: 1.41 mg/dL — ABNORMAL HIGH (ref 0.50–1.10)
GFR calc Af Amer: 43 mL/min — ABNORMAL LOW (ref >90)
GFR calc non Af Amer: 37 mL/min — ABNORMAL LOW (ref >90)
Potassium: 3.9 mEq/L (ref 3.5–5.1)
Sodium: 143 mEq/L (ref 135–145)

## 2013-04-14 LAB — CULTURE, BLOOD (ROUTINE X 2)

## 2013-04-14 LAB — GLUCOSE, CAPILLARY
Glucose-Capillary: 273 mg/dL — ABNORMAL HIGH (ref 70–99)
Glucose-Capillary: 168 mg/dL — ABNORMAL HIGH (ref 70–99)
Glucose-Capillary: 122 mg/dL — ABNORMAL HIGH (ref 70–99)
Glucose-Capillary: 84 mg/dL (ref 70–99)

## 2013-04-14 LAB — PRO B NATRIURETIC PEPTIDE: Pro B Natriuretic peptide (BNP): 671.6 pg/mL — ABNORMAL HIGH (ref 0–125)

## 2013-04-14 MED ORDER — POLYETHYLENE GLYCOL 3350 17 G PO PACK
17.0000 g | PACK | Freq: Every day | ORAL | Status: DC
  Administered 2013-04-15 – 2013-04-17 (×2): 17 g via ORAL
  Filled 2013-04-14 (×4): qty 1

## 2013-04-14 MED ORDER — DOCUSATE SODIUM 100 MG PO CAPS: 100.0000 mg | ORAL_CAPSULE | Freq: Two times a day (BID) | ORAL | Status: DC | PRN

## 2013-04-14 NOTE — Progress Notes (Signed)
[facility] PROGRESS NOTE  Sarah Colon [id number] DOB: May 20, 1946 DOA: 04/07/2013 PCP: Salena Saner., MD  Brief narrative  44 F with HTN, DM, arthritis presented with pain over her bilateral knees and ankles for 4 days. Patient septic with fever of 101F, leucocytosis and tachycardia. Elevated ESR and CRP. Moderate effusion over left knee joint which was tapped by ortho and shows wbc of 12k with MSU crystals.    Assessment/Plan:  Sepsis secondary to  acute polyarticular gouty arthritis  Added empiric IV vancomycin and cefepime on admission for possible septic arthritis given fever and leukocytosis... Noted elevated ESR, CRP and CPK.. 1/2 blood cx on admission growing GPC and GN coccobacilli. Likely contaminant P8 antibiotic discontinued on 11/11 -Left Knee joint tapped by orthopedics . Fluid showing wbc of 12k and MSU. cx so far no growth. Elevated uric acid level. placed her on naproxen bid. Joint swelling has resolved. Continue pain control.  2-D echo negative for vegetation. HIV antibody negative -Appreciate orthopedics and ID consult.  Leukocytosis  Despite antibiotics and diuresis, white count worse today. Have strong suspicion that this is early bacterial translocation of the gut from constipation, given that no change with diuresis her antibiotics, but yet patient looks clinically well  Constipation: Started MiraLAX and Colace. If no response, will use fleets enema. Abdominal x-ray confirms diagnosis  Acute on Chronic diastolic heart failure: Elevated white blood cell count could be volume overload. Responding well with Lasix and BNP down. Unable to get strict I and O. given urinary incontinence  Transaminitis  liver US shows fatty liver.. hepatitis panel negative. Elevated CPK on admission. UA unremarkable  LFTs and CPK improving. Could be secondary hepatic congestion from volume overload see above.  Diabetes mellitus  Continue sliding scale   Hypertension   Blood pressure stable   Hypokalemia  replenished   generalized weakness  Patient has residual leg weakness likely from being bedbound for almost 2 weeks. -Seen by PT and recommends skilled nursing facility. Social worker aware. 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 Have restarted her Neurontin  DVT prophylaxis: Subcutaneous heparin  Code Status: Full code  Family Communication: Patient to me not to call her daughter Disposition: Given persistently elevated white blood cell count and mild heart failure, we'll hold off on discharge today. Possibly tomorrow or Monday  Consultants:  Dr Onnie Graham ( ortho)  ID    Procedures:  Left knee joint aspiration   Antibiotics:  IV vancomycin and ceftriaxone ( 11/9>>11/11)   HPI/Subjective: Urinary incontinence secondary Lasix. By bowel movements, she's not had a bowel movement for almost a week. She try to move her bowels yesterday does have some liquid stool. No real leg pain  Objective: Filed Vitals:   04/14/13 0500  BP: 124/73  Pulse: 91  Temp: 97.9 F (36.6 C)  Resp: 16    Intake/Output Summary (Last 24 hours) at 04/14/13 0818 Last data filed at 04/14/13 0700  Gross per 24 hour  Intake    860 ml  Output      0 ml  Net    860 ml   Filed Weights   04/10/13 2044 04/11/13 2021 04/12/13 2015  Weight: 56.3 kg (124 lb 1.9 oz) 56.3 kg (124 lb 1.9 oz) 56.3 kg (124 lb 1.9 oz)    Exam:  General:  Alert x3, no acute distress Chest: Clear to auscultation bilaterally, no added sounds  CVS: Regular rate and rhythm, S1-S2 Abdomen: Soft, nontender, nondistended, bowel sounds present  Extremities:swelling over the knee and  ankle jts resolved.   Data Reviewed: Basic Metabolic Panel:  Recent Labs Lab 04/09/13 0610 04/10/13 1455 04/11/13 1300 04/13/13 0655 04/14/13 0540  NA 142 140 141 144 143  K 4.0 4.4 4.5 4.2 3.9  CL 108 107 107 112 103  CO2 22 23 24 24 30   GLUCOSE 126* 118* 136* 133* 179*  BUN 24* 21 26* 22 35*   CREATININE 0.97 1.17* 1.03 0.98 1.41*  CALCIUM 9.0 8.8 9.1 8.7 9.3   Liver Function Tests:  Recent Labs Lab 04/08/13 0230 04/09/13 0610 04/10/13 1455 04/11/13 1300  AST 139*  137* 179* 152* 260*  ALT 165*  163* 264* 211* 319*  ALKPHOS 190*  193* 218* 209* 273*  BILITOT 0.8  0.8 0.6 0.4 0.3  PROT 7.4  7.3 7.1 6.6 6.9  ALBUMIN 2.4*  2.3* 2.1* 2.0* 2.4*   No results found for this basename: LIPASE, AMYLASE,  in the last 168 hours No results found for this basename: AMMONIA,  in the last 168 hours CBC:  Recent Labs Lab 04/07/13 2006  04/09/13 0610 04/10/13 0529 04/12/13 0845 04/13/13 0655 04/14/13 0540  WBC 20.2*  < > 18.2* 14.1* 15.0* 15.6* 17.6*  NEUTROABS 16.4*  --   --   --   --   --  12.5*  HGB 12.7  < > 11.5* 11.9* 10.3* 10.3* 11.1*  HCT 34.6*  < > 31.6* 33.5* 29.5* 29.5* 31.7*  MCV 81.6  < > 81.7 82.7 82.6 83.3 83.6  PLT 539*  < > 572* 648* 667* 654* 814*  < > = values in this interval not displayed. Cardiac Enzymes:  Recent Labs Lab 04/08/13 0230 04/09/13 0610  CKTOTAL 1567* 440*   BNP (last 3 results)  Recent Labs  04/12/13 1822 04/14/13 0540  PROBNP 1377.0* 671.6*   CBG:  Recent Labs Lab 04/12/13 2210 04/13/13 0729 04/13/13 1136 04/13/13 1636 04/13/13 2048  GLUCAP 148* 131* 149* 115* 273*    Recent Results (from the past 240 hour(s))  CULTURE, BLOOD (ROUTINE X 2)     Status: None   Collection Time    04/07/13 11:00 PM      Result Value Range Status   Specimen Description BLOOD RIGHT ARM   Final   Special Requests BOTTLES DRAWN AEROBIC AND ANAEROBIC 10CC EACH   Final   Culture  Setup Time     Final   Value: 04/08/2013 13:19     THE SIGNIFICANCE OF ISOLATING THIS ORGANISM FROM A SINGLE SET OF BLOOD CULTURES WHEN MULTIPLE SETS ARE DRAWN IS UNCERTAIN. PLEASE NOTIFY THE MICROBIOLOGY DEPARTMENT WITHIN ONE WEEK IF SPECIATION AND SENSITIVITIES ARE REQUIRED.     Performed at [facility]     Final   Value: GRAM  NEGATIVE COCCOBACILLI     STAPHYLOCOCCUS SPECIES (COAGULASE NEGATIVE)     Note: Gram Stain Report Called to,Read Back By and Verified With: NIKKI MURPHY 04/08/2013 [place]     Performed at [facility]   Report Status PENDING   Incomplete  CULTURE, BLOOD (ROUTINE X 2)     Status: None   Collection Time    04/07/13 11:04 PM      Result Value Range Status   Specimen Description BLOOD RIGHT HAND   Final   Special Requests BOTTLES DRAWN AEROBIC ONLY 2CC   Final   Culture  Setup Time     Final   Value: 04/08/2013 13:19     Performed at [facility]  Culture     Final   Value:        BLOOD CULTURE RECEIVED NO GROWTH TO DATE CULTURE WILL BE HELD FOR 5 DAYS BEFORE ISSUING A FINAL NEGATIVE REPORT     Performed at [facility]   Report Status PENDING   Incomplete  BODY FLUID CULTURE     Status: None   Collection Time    04/08/13  1:00 PM      Result Value Range Status   Specimen Description FLUID SYNOVIAL LEFT KNEE   Final   Special Requests PATIENT ON FOLLOWING VANCO   Final   Gram Stain     Final   Value: MODERATE WBC PRESENT, PREDOMINANTLY MONONUCLEAR     NO ORGANISMS SEEN     Performed at [facility]   Culture     Final   Value: NO GROWTH 3 DAYS     Performed at [facility]   Report Status 04/12/2013 FINAL   Final  ANAEROBIC CULTURE     Status: None   Collection Time    04/08/13  1:00 PM      Result Value Range Status   Specimen Description FLUID SYNOVIAL LEFT KNEE   Final   Special Requests Immunocompromised   Final   Gram Stain     Final   Value: MODERATE WBC PRESENT,BOTH PMN AND MONONUCLEAR     NO ORGANISMS SEEN     Performed at [facility]   Culture     Final   Value: NO ANAEROBES ISOLATED     Performed at [facility]   Report Status 04/13/2013 FINAL   Final     Studies: No results found.  Scheduled Meds: . feeding supplement (GLUCERNA SHAKE)  237 mL Oral TID BM  . furosemide  40 mg Intravenous Q12H  .  gabapentin  600 mg Oral BID  . heparin  5,000 Units Subcutaneous Q8H  . insulin aspart  0-15 Units Subcutaneous TID WC  . insulin aspart  0-5 Units Subcutaneous QHS  . levofloxacin (LEVAQUIN) IV  750 mg Intravenous Q48H  . nafcillin IV  1 g Intravenous Q6H  . naproxen  500 mg Oral BID WC   Continuous Infusions:      Time spent: 30 minutes    [facility] Pager [phone] If 7PM-7AM, please contact night-coverage at [email or web address], password [facility] 04/14/2013, 8:18 AM  LOS: 7 days

## 2013-04-14 NOTE — Progress Notes (Signed)
Patient stated that she had $20 dollars in her bedside table and wanted me to place the money in her billfold in her purse. I asked her why she felt she needed money while staying in the hospital and she stated that her brother had visited today and gave her the money when she left. I placed the money in her purse as she requested and made sure she watched me the whole time. I asked if she wanted her purse to be locked up by security and she declined. Purse was placed back in her closet in the room.   Lashone Stauber [employer], RN

## 2013-04-15 DIAGNOSIS — M79609 Pain in unspecified limb: Secondary | ICD-10-CM

## 2013-04-15 LAB — CBC
Hemoglobin: 10.5 g/dL — ABNORMAL LOW (ref 12.0–15.0)
Hemoglobin: 11 g/dL — ABNORMAL LOW (ref 12.0–15.0)
MCH: 29.3 pg (ref 26.0–34.0)
MCHC: 34.8 g/dL (ref 30.0–36.0)
MCV: 84.6 fL (ref 78.0–100.0)
RBC: 3.58 MIL/uL — ABNORMAL LOW (ref 3.87–5.11)
RBC: 3.66 MIL/uL — ABNORMAL LOW (ref 3.87–5.11)

## 2013-04-15 LAB — COMPREHENSIVE METABOLIC PANEL
ALT: 94 U/L — ABNORMAL HIGH (ref 0–35)
AST: 26 U/L (ref 0–37)
Albumin: 2.5 g/dL — ABNORMAL LOW (ref 3.5–5.2)
Alkaline Phosphatase: 177 U/L — ABNORMAL HIGH (ref 39–117)
CO2: 27 mEq/L (ref 19–32)
Calcium: 8.9 mg/dL (ref 8.4–10.5)
GFR calc Af Amer: 43 mL/min — ABNORMAL LOW (ref >90)
Glucose, Bld: 187 mg/dL — ABNORMAL HIGH (ref 70–99)
Potassium: 4.3 mEq/L (ref 3.5–5.1)
Sodium: 142 mEq/L (ref 135–145)
Total Bilirubin: 0.2 mg/dL — ABNORMAL LOW (ref 0.3–1.2)
Total Protein: 6.8 g/dL (ref 6.0–8.3)

## 2013-04-15 LAB — GLUCOSE, CAPILLARY: Glucose-Capillary: 128 mg/dL — ABNORMAL HIGH (ref 70–99)

## 2013-04-15 LAB — CLOSTRIDIUM DIFFICILE BY PCR: Toxigenic C. Difficile by PCR: NEGATIVE

## 2013-04-15 NOTE — Progress Notes (Addendum)
[facility] PROGRESS NOTE  LOYAL WACH [id number] DOB: 01/22/1946 DOA: 04/07/2013 PCP: Salena Saner., MD  Brief narrative  45 F with HTN, DM, arthritis presented with pain over her bilateral knees and ankles for 4 days. Patient septic with fever of 101F, leucocytosis and tachycardia. Elevated ESR and CRP. Moderate effusion over left knee joint which was tapped by ortho and shows wbc of 12k with MSU crystals.    Assessment/Plan:  Sepsis secondary to  acute polyarticular gouty arthritis  Added empiric IV vancomycin and cefepime on admission for possible septic arthritis given fever and leukocytosis... Noted elevated ESR, CRP and CPK.. 1/2 blood cx on admission growing GPC and GN coccobacilli. Likely contaminant P8 antibiotic discontinued on 11/11 -Left Knee joint tapped by orthopedics . Fluid showing wbc of 12k and MSU. cx so far no growth. Elevated uric acid level. placed her on naproxen bid. Joint swelling has resolved. Continue pain control.  2-D echo negative for vegetation. HIV antibody negative -Appreciate orthopedics and ID consult.  Leukocytosis  Despite antibiotics and diuresis, white count worse today. Have strong suspicion that this is early bacterial translocation of the gut from constipation, given that no change with diuresis her antibiotics, but yet patient looks clinically well. We'll give more MiraLAX and clean out today. To be thorough, will check C. difficile PCR. Has stopped antibiotics as they don't seem to be doing anything. We'll also check afternoon CBC and if still elevated will re-consult infectious disease  Constipation: Started MiraLAX and Colace. If no response, will use fleets enema. Abdominal x-ray confirms diagnosis  Acute on Chronic diastolic heart failure: Elevated white blood cell count could be volume overload. Responding well with Lasix and BNP down. Unable to get strict I and O. given urinary incontinence. Stop Lasix given mild elevation  in BUN and creatinine, recheck morning BNP  Transaminitis  liver US shows fatty liver.. hepatitis panel negative. Elevated CPK on admission. UA unremarkable  LFTs and CPK improving. Could be secondary hepatic congestion from volume overload see above.  Diabetes mellitus  Continue sliding scale   Hypertension  Blood pressure stable   Hypokalemia  replenished   generalized weakness  Patient has residual leg weakness likely from being bedbound for almost 2 weeks. -Seen by PT and recommends skilled nursing facility. Social worker aware. 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 Have restarted her Neurontin  DVT prophylaxis: Subcutaneous heparin  Code Status: Full code  Family Communication: Patient to me not to call her daughter Disposition: Given persistently elevated white blood cell count, discharge Monday white blood cell count improves  Consultants:  Dr Onnie Graham ( ortho)  ID    Procedures:  Left knee joint aspiration   Antibiotics:  IV vancomycin and ceftriaxone ( 11/9>>11/11) IV nafcillin and Levaquin (11/14-11/16)  HPI/Subjective: Patient stated she had moderate amount of bowel movement yesterday. She also tells me that she actually did not think that she took the MiraLAX although she did take the Colace. Belly distended today. No abnormal pain. Still having chronic low extremity discomfort  Objective: Filed Vitals:   04/15/13 0605  BP: 114/61  Pulse: 89  Temp: 98.4 F (36.9 C)  Resp: 18    Intake/Output Summary (Last 24 hours) at 04/15/13 0745 Last data filed at 04/15/13 [id number]  Gross per 24 hour  Intake   1180 ml  Output      0 ml  Net   1180 ml   Filed Weights   04/11/13 2021 04/12/13 2015 04/14/13 2112  Weight: 56.3  kg (124 lb 1.9 oz) 56.3 kg (124 lb 1.9 oz) 56.3 kg (124 lb 1.9 oz)    Exam:  General:  Alert x3, no acute distress Chest: Clear to auscultation bilaterally, no added sounds  CVS: Regular rate and rhythm, S1-S2 Abdomen: Soft,  nontender, distended, bowel sounds present  Extremities: Minimal swelling, nonspecific tenderness over lower extremities   Data Reviewed: Basic Metabolic Panel:  Recent Labs Lab 04/09/13 0610 04/10/13 1455 04/11/13 1300 04/13/13 0655 04/14/13 0540  NA 142 140 141 144 143  K 4.0 4.4 4.5 4.2 3.9  CL 108 107 107 112 103  CO2 22 23 24 24 30   GLUCOSE 126* 118* 136* 133* 179*  BUN 24* 21 26* 22 35*  CREATININE 0.97 1.17* 1.03 0.98 1.41*  CALCIUM 9.0 8.8 9.1 8.7 9.3   Liver Function Tests:  Recent Labs Lab 04/09/13 0610 04/10/13 1455 04/11/13 1300  AST 179* 152* 260*  ALT 264* 211* 319*  ALKPHOS 218* 209* 273*  BILITOT 0.6 0.4 0.3  PROT 7.1 6.6 6.9  ALBUMIN 2.1* 2.0* 2.4*   No results found for this basename: LIPASE, AMYLASE,  in the last 168 hours No results found for this basename: AMMONIA,  in the last 168 hours CBC:  Recent Labs Lab 04/10/13 0529 04/12/13 0845 04/13/13 0655 04/14/13 0540 04/15/13 0458  WBC 14.1* 15.0* 15.6* 17.6* 19.7*  NEUTROABS  --   --   --  12.5*  --   HGB 11.9* 10.3* 10.3* 11.1* 10.5*  HCT 33.5* 29.5* 29.5* 31.7* 30.3*  MCV 82.7 82.6 83.3 83.6 84.6  PLT 648* 667* 654* 814* 837*   Cardiac Enzymes:  Recent Labs Lab 04/09/13 0610  CKTOTAL 440*   BNP (last 3 results)  Recent Labs  04/12/13 1822 04/14/13 0540  PROBNP 1377.0* 671.6*   CBG:  Recent Labs Lab 04/14/13 0800 04/14/13 1200 04/14/13 1700 04/14/13 2114 04/15/13 0733  GLUCAP 168* 142* 122* 84 128*    Recent Results (from the past 240 hour(s))  CULTURE, BLOOD (ROUTINE X 2)     Status: None   Collection Time    04/07/13 11:00 PM      Result Value Range Status   Specimen Description BLOOD RIGHT ARM   Final   Special Requests BOTTLES DRAWN AEROBIC AND ANAEROBIC 10CC EACH   Final   Culture  Setup Time     Final   Value: 04/08/2013 13:19     IDENTIFIED AS AEROCOCCUS VIRIDANS     Performed at [facility]   Culture     Final   Value: GRAM NEGATIVE  COCCOBACILLI     STAPHYLOCOCCUS SPECIES (COAGULASE NEGATIVE)     AEROCOCCUS SPECIES     Note: THE SIGNIFICANCE OF ISOLATING THIS ORGANISM FROM A SINGLE SET OF BLOOD CULTURES WHEN MULTIPLE SETS ARE DRAWN IS UNCERTAIN. PLEASE NOTIFY THE MICROBIOLOGY DEPARTMENT WITHIN ONE WEEK IF SPECIATION AND SENSITIVITIES ARE REQUIRED.     Note: Gram Stain Report Called to,Read Back By and Verified With: NIKKI MURPHY 04/08/2013 [facility]   Report Status PENDING   Incomplete  CULTURE, BLOOD (ROUTINE X 2)     Status: None   Collection Time    04/07/13 11:04 PM      Result Value Range Status   Specimen Description BLOOD RIGHT HAND   Final   Special Requests BOTTLES DRAWN AEROBIC ONLY 2CC   Final   Culture  Setup Time     Final   Value: 04/08/2013 13:19     Performed at  [facility]     Final   Value: NO GROWTH 5 DAYS     Performed at [facility]   Report Status 04/14/2013 FINAL   Final  BODY FLUID CULTURE     Status: None   Collection Time    04/08/13  1:00 PM      Result Value Range Status   Specimen Description FLUID SYNOVIAL LEFT KNEE   Final   Special Requests PATIENT ON FOLLOWING VANCO   Final   Gram Stain     Final   Value: MODERATE WBC PRESENT, PREDOMINANTLY MONONUCLEAR     NO ORGANISMS SEEN     Performed at [facility]   Culture     Final   Value: NO GROWTH 3 DAYS     Performed at [facility]   Report Status 04/12/2013 FINAL   Final  ANAEROBIC CULTURE     Status: None   Collection Time    04/08/13  1:00 PM      Result Value Range Status   Specimen Description FLUID SYNOVIAL LEFT KNEE   Final   Special Requests Immunocompromised   Final   Gram Stain     Final   Value: MODERATE WBC PRESENT,BOTH PMN AND MONONUCLEAR     NO ORGANISMS SEEN     Performed at [facility]   Culture     Final   Value: NO ANAEROBES ISOLATED     Performed at [facility]   Report Status 04/13/2013 FINAL   Final  CULTURE, BLOOD (ROUTINE X 2)     Status:  None   Collection Time    04/13/13  7:00 PM      Result Value Range Status   Specimen Description BLOOD RIGHT ARM   Final   Special Requests BOTTLES DRAWN AEROBIC AND ANAEROBIC 10CC   Final   Culture  Setup Time     Final   Value: 04/13/2013 22:26     Performed at [facility]   Culture     Final   Value:        BLOOD CULTURE RECEIVED NO GROWTH TO DATE CULTURE WILL BE HELD FOR 5 DAYS BEFORE ISSUING A FINAL NEGATIVE REPORT     Performed at [facility]   Report Status PENDING   Incomplete  CULTURE, BLOOD (ROUTINE X 2)     Status: None   Collection Time    04/13/13  7:05 PM      Result Value Range Status   Specimen Description BLOOD RIGHT HAND   Final   Special Requests BOTTLES DRAWN AEROBIC ONLY 10CC   Final   Culture  Setup Time     Final   Value: 04/13/2013 22:26     Performed at [facility]   Culture     Final   Value:        BLOOD CULTURE RECEIVED NO GROWTH TO DATE CULTURE WILL BE HELD FOR 5 DAYS BEFORE ISSUING A FINAL NEGATIVE REPORT     Performed at [facility]   Report Status PENDING   Incomplete     Studies: Dg Abd 1 View  04/14/2013   CLINICAL DATA:  Leukocytosis. Decreased bowel sounds. Question ileus versus constipation.  EXAM: ABDOMEN - 1 VIEW  COMPARISON:  None.  FINDINGS: And moderate stool is present within the rectus sigmoid colon. There is moderate stool in the ascending and proximal transverse colon is well. The intervening: Appears  to be within normal limits. A focal calcification over the pelvis likely represents uterine fibroid.  IMPRESSION: 1. Moderate stool at the rectus sigmoid colon and ascending colon without evidence for obstruction. 2. Suspect calcified uterine fibroid.   Electronically Signed   By: Lawrence Santiago M.D.   On: 04/14/2013 11:13    Scheduled Meds: . feeding supplement (GLUCERNA SHAKE)  237 mL Oral TID BM  . gabapentin  600 mg Oral BID  . heparin  5,000 Units Subcutaneous Q8H  . insulin aspart  0-15 Units  Subcutaneous TID WC  . insulin aspart  0-5 Units Subcutaneous QHS  . levofloxacin (LEVAQUIN) IV  750 mg Intravenous Q48H  . nafcillin IV  1 g Intravenous Q6H  . polyethylene glycol  17 g Oral Daily   Continuous Infusions:      Time spent: 30 minutes    Annita Brod  [facility] Pager [phone] If 7PM-7AM, please contact night-coverage at [email or web address], password [facility] 04/15/2013, 7:45 AM  LOS: 8 days

## 2013-04-16 ENCOUNTER — Inpatient Hospital Stay (HOSPITAL_COMMUNITY): Payer: Medicare Other

## 2013-04-16 LAB — BASIC METABOLIC PANEL
CO2: 29 mEq/L (ref 19–32)
Calcium: 9 mg/dL (ref 8.4–10.5)
Creatinine, Ser: 1.15 mg/dL — ABNORMAL HIGH (ref 0.50–1.10)
GFR calc non Af Amer: 47 mL/min — ABNORMAL LOW (ref >90)
Glucose, Bld: 149 mg/dL — ABNORMAL HIGH (ref 70–99)

## 2013-04-16 LAB — GLUCOSE, CAPILLARY
Glucose-Capillary: 182 mg/dL — ABNORMAL HIGH (ref 70–99)
Glucose-Capillary: 140 mg/dL — ABNORMAL HIGH (ref 70–99)
Glucose-Capillary: 209 mg/dL — ABNORMAL HIGH (ref 70–99)

## 2013-04-16 LAB — URINALYSIS, ROUTINE W REFLEX MICROSCOPIC
Glucose, UA: NEGATIVE mg/dL
Hgb urine dipstick: NEGATIVE
Ketones, ur: NEGATIVE mg/dL
Leukocytes, UA: NEGATIVE
pH: 7 (ref 5.0–8.0)

## 2013-04-16 LAB — CULTURE, BLOOD (ROUTINE X 2)

## 2013-04-16 LAB — CBC
HCT: 28.6 % — ABNORMAL LOW (ref 36.0–46.0)
Hemoglobin: 9.9 g/dL — ABNORMAL LOW (ref 12.0–15.0)
MCH: 29.6 pg (ref 26.0–34.0)
MCHC: 34.6 g/dL (ref 30.0–36.0)
MCV: 85.4 fL (ref 78.0–100.0)
RBC: 3.35 MIL/uL — ABNORMAL LOW (ref 3.87–5.11)

## 2013-04-16 MED ORDER — ALUM & MAG HYDROXIDE-SIMETH 200-200-20 MG/5ML PO SUSP
30.0000 mL | Freq: Four times a day (QID) | ORAL | Status: DC | PRN
  Administered 2013-04-16: 30 mL via ORAL
  Filled 2013-04-16: qty 30

## 2013-04-16 MED ORDER — POLYETHYLENE GLYCOL 3350 17 G PO PACK
17.0000 g | PACK | ORAL | Status: AC
  Administered 2013-04-16: 17 g via ORAL
  Filled 2013-04-16: qty 1

## 2013-04-16 NOTE — Progress Notes (Signed)
Physical Therapy Treatment Patient Details Name: ERYNNE STEENSON MRN: [id number] DOB: 1945/10/12 Today's Date: 04/16/2013 Time: [id number] PT Time Calculation (min): 24 min  PT Assessment / Plan / Recommendation  History of Present Illness CORALYN KOENNING is a 67 y.o. female with Past medical history of arthritis, hypertension, diabetes, skin grafting.  She is admitted with about a week of progressive leg pain and weakness to the point that she was unable to ambulate.  Nursing report that pt has had multiple episodes of incontinence today and pt reports she is unable to control it  Pt was found to have sepsis possiby due to septic arthritis    PT Comments   Pt unable to progress ambulation today secondary to fatigue. Attempted to stand x 3 and unable to at this time. Pt will require a lift at this time for OOB transfers with nursing. Pt had loose BM during session and was (A) with perineal hygiene. Pt appreciative and continues to be motivated to return to PLOF.   Follow Up Recommendations  SNF     Does the patient have the potential to tolerate intense rehabilitation     Barriers to Discharge        Equipment Recommendations  Rolling walker with 5" wheels    Recommendations for Other Services    Frequency Min 2X/week   Progress towards PT Goals Progress towards PT goals: Progressing toward goals  Plan Current plan remains appropriate;Frequency needs to be updated    Precautions / Restrictions Precautions Precautions: Fall Restrictions Weight Bearing Restrictions: No   Pertinent Vitals/Pain 8/10 in Lt knee. patient repositioned for comfort     Mobility  Bed Mobility Bed Mobility: Supine to Sit;Rolling Right;Rolling Left;Sitting - Scoot to Edge of Bed;Sit to Supine Rolling Right: 5: Supervision;With rail Rolling Left: 5: Supervision;With rail Supine to Sit: 5: Supervision;HOB elevated;With rails Sitting - Scoot to Edge of Bed: 5: Supervision Sit to Supine: 4: Min  assist;HOB flat Details for Bed Mobility Assistance: pt able to sit EOB with cues for hand placement and sequencing; pt required min (A) to advance LEs onto bed secondary to fatigue and weakness Transfers Transfers: Sit to Stand;Stand to Sit Details for Transfer Assistance: attempted sit to stand x 3; pt unable to fully complete secondary to decreased LE strength and posterior thrust. pt unsteady with 2+ (A) to reach standing position; required max cues for proper technique and sequencing; bil LEs of pt were buckled and pt continued to attempt to flex at knees then thrust posteriorly; pt attempting to pull up with UEs from RW; demo decr safety awareness with technique and becomes frustrated when she cannot achieve upright standing position; pt will require a lift at this time. pt had BM on EOB and required return to supine for perineal hygiene  Ambulation/Gait Ambulation/Gait Assistance: Not tested (comment) Stairs: No Wheelchair Mobility Wheelchair Mobility: No    Exercises General Exercises - Lower Extremity Ankle Circles/Pumps: AROM;Both;10 reps;Seated Long Arc Quad: AROM;10 reps;Seated   PT Diagnosis:    PT Problem List:   PT Treatment Interventions:     PT Goals (current goals can now be found in the care plan section) Acute Rehab PT Goals Patient Stated Goal: to walk more  PT Goal Formulation: With patient Time For Goal Achievement: 04/24/13 Potential to Achieve Goals: Good  Visit Information  Last PT Received On: 04/16/13 Assistance Needed: +2 History of Present Illness: DORTHIE HOUSEKNECHT is a 67 y.o. female with Past medical history of arthritis, hypertension, diabetes, skin  grafting.  She is admitted with about a week of progressive leg pain and weakness to the point that she was unable to ambulate.  Nursing report that pt has had multiple episodes of incontinence today and pt reports she is unable to control it  Pt was found to have sepsis possiby due to septic arthritis      Subjective Data  Subjective: pt lying supine agreeable to therapy. " i havent walked in awhile. i think i can do good today."  Patient Stated Goal: to walk more    Cognition  Cognition Arousal/Alertness: Awake/alert Behavior During Therapy: WFL for tasks assessed/performed Overall Cognitive Status: Within Functional Limits for tasks assessed    Balance  Balance Balance Assessed: Yes Static Sitting Balance Static Sitting - Balance Support: Bilateral upper extremity supported;Feet supported Static Sitting - Level of Assistance: 5: Stand by assistance Static Sitting - Comment/# of Minutes: pt leans posteriorly at times due to fatigue; requires cues for upright position at EOB; tolerated sitting ~8 min   End of Session PT - End of Session Equipment Utilized During Treatment: Gait belt Activity Tolerance: Patient limited by fatigue Patient left: in bed;with call bell/phone within reach Nurse Communication: Mobility status;Need for lift equipment   GP     Gustavus Bryant, [place] 04/16/2013, 2:43 PM

## 2013-04-16 NOTE — Progress Notes (Signed)
NUTRITION FOLLOW-UP  DOCUMENTATION CODES Per approved criteria  -Not Applicable   INTERVENTION: Continue Glucerna Shakes TID, 8 oz provides 220 kcal, 10 g protein RD to continue to follow nutrition care plan.  NUTRITION DIAGNOSIS: Inadequate oral intake related to food preferences as evidenced by 10% meal intake. Improving.  Goal: Patient will meet >/=90% of estimated nutrition needs -improving.  Monitor:  PO intake, weight, labs, I/Os  ASSESSMENT: Patient with history of diabetes, and hypertension, admitted with bilateral knee pain, sepsis secondary to acute gouty arthritis.   Ortho consulted. Knee joint was tapped. Pt with residual leg weakness from being bedbound x 2 weeks.  Continues on Carbohydrate Modified Medium diet, consuming approx 50% of meals. Continues on [employer], pt states that she really enjoys these supplements. States that she feels as though they give her "energy" when she is not eating well. Doesn't care for the food here, but forces her self to eat something off of each tray.  Pt with constipation, started on miralax and colace. States that since she has had the laxatives, she has been having frequent soft stools.  Height: Ht Readings from Last 1 Encounters:  04/07/13 4\' 11"  (1.499 m)    Weight: Wt Readings from Last 1 Encounters:  04/15/13 124 lb 1.9 oz (56.3 kg)  Admit wt 124 lb - stable  BMI:  Body mass index is 25.06 kg/(m^2). Patient is overweight.   Estimated Nutritional Needs: Kcal: 1300-1400 kcal Protein: 75-85 g Fluid: >1.7 L/day  Skin: Stage 2 pressure ulcer, buttocks, wound left knee, bilateral lower extremity edema  Diet Order: Carb Control  EDUCATION NEEDS: -No education needs identified at this time   Intake/Output Summary (Last 24 hours) at 04/16/13 1237 Last data filed at 04/16/13 1100  Gross per 24 hour  Intake    940 ml  Output      0 ml  Net    940 ml    Last BM: 11/17  Labs:   Recent Labs Lab  04/14/13 0540 04/15/13 1400 04/16/13 0605  NA 143 142 143  K 3.9 4.3 4.4  CL 103 105 106  CO2 30 27 29   BUN 35* 32* 36*  CREATININE 1.41* 1.42* 1.15*  CALCIUM 9.3 8.9 9.0  GLUCOSE 179* 187* 149*    CBG (last 3)   Recent Labs  04/15/13 2138 04/16/13 0824 04/16/13 1203  GLUCAP 320* 137* 182*    Scheduled Meds: . feeding supplement (GLUCERNA SHAKE)  237 mL Oral TID BM  . gabapentin  600 mg Oral BID  . heparin  5,000 Units Subcutaneous Q8H  . insulin aspart  0-15 Units Subcutaneous TID WC  . insulin aspart  0-5 Units Subcutaneous QHS  . polyethylene glycol  17 g Oral Daily    Continuous Infusions:    Inda Coke MS, RD, LDN Pager: [phone] After-hours pager: [phone]

## 2013-04-16 NOTE — Progress Notes (Signed)
VASCULAR LAB PRELIMINARY  PRELIMINARY  PRELIMINARY  PRELIMINARY  Bilateral lower extremity venous Dopplers completed.    Preliminary report:  There is no DVT or SVT noted in the bilateral lower extremities.  Kden Wagster, RVT 04/16/2013, 8:05 AM

## 2013-04-16 NOTE — Progress Notes (Signed)
[facility] PROGRESS NOTE  Sarah Colon [id number] DOB: 06-18-1945 DOA: 04/07/2013 PCP: Salena Saner., MD  Brief narrative  71 F with HTN, DM, arthritis presented with pain over her bilateral knees and ankles for 4 days. Patient septic with fever of 101F, leucocytosis and tachycardia. Elevated ESR and CRP. Moderate effusion over left knee joint which was tapped by ortho and shows wbc of 12k with MSU crystals.    Assessment/Plan:  Sepsis secondary to  acute polyarticular gouty arthritis  Added empiric IV vancomycin and cefepime on admission for possible septic arthritis given fever and leukocytosis... Noted elevated ESR, CRP and CPK.. 1/2 blood cx on admission growing GPC and GN coccobacilli. Likely contaminant P8 antibiotic discontinued on 11/11 -Left Knee joint tapped by orthopedics . Fluid showing wbc of 12k and MSU. cx so far no growth. Elevated uric acid level. placed her on naproxen bid. Joint swelling has resolved. Continue pain control.  2-D echo negative for vegetation. HIV antibody negative -Appreciate orthopedics and ID consult.  Leukocytosis  Despite antibiotics and diuresis, white count worse today. Have strong suspicion that this is early bacterial translocation of the gut from constipation, given that no change with diuresis her antibiotics, but yet patient looks clinically well. We'll give more MiraLAX and clean out today. Cdiff is PCR negative. Dopplers negative for DVT. With repeat MiraLAX, patient had again multiple bowel movements. No fever, and white count for first time starting to decrease.  Constipation: Started MiraLAX and Colace. If no response, will use fleets enema. Abdominal x-ray confirms diagnosis, we'll check repeat  Acute on Chronic diastolic heart failure: Elevated white blood cell count could be volume overload. Responding well with Lasix and BNP down. Unable to get strict I and O. given urinary incontinence. Stop Lasix given mild elevation  in BUN and creatinine, recheck morning BNP  Transaminitis  liver US shows fatty liver.. hepatitis panel negative. Elevated CPK on admission. UA unremarkable  LFTs and CPK improving. Could be secondary hepatic congestion from volume overload see above.  Diabetes mellitus  Continue sliding scale   Hypertension  Blood pressure stable   Hypokalemia  replenished   generalized weakness  Patient has residual leg weakness likely from being bedbound for almost 2 weeks. -Seen by PT and recommends skilled nursing facility. Social worker aware. 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 Have restarted her Neurontin  DVT prophylaxis: Subcutaneous heparin  Code Status: Full code  Family Communication: Patient to me not to call her daughter Disposition: If white count further declines, skilled nursing tomorrow  Consultants:  Dr Onnie Graham ( ortho)  ID    Procedures:  Left knee joint aspiration   Antibiotics:  IV vancomycin and ceftriaxone ( 11/9>>11/11) IV nafcillin and Levaquin (11/14-11/16)  HPI/Subjective: Patient had a number of bowel movements yesterday. Feeling a little bit better. Tolerating by mouth. Leg pain a little bit better today.  Objective: Filed Vitals:   04/16/13 1000  BP: 139/68  Pulse: 105  Temp: 99.3 F (37.4 C)  Resp: 20    Intake/Output Summary (Last 24 hours) at 04/16/13 1329 Last data filed at 04/16/13 1100  Gross per 24 hour  Intake    940 ml  Output      0 ml  Net    940 ml   Filed Weights   04/12/13 2015 04/14/13 2112 04/15/13 2136  Weight: 56.3 kg (124 lb 1.9 oz) 56.3 kg (124 lb 1.9 oz) 56.3 kg (124 lb 1.9 oz)    Exam:  General:  Alert  x3, no acute distress Chest: Clear to auscultation bilaterally, no added sounds  CVS: Regular rate and rhythm, S1-S2 Abdomen: Soft, nontender, distended, bowel sounds present  Extremities: Minimal swelling, nonspecific tenderness over lower extremities   Data Reviewed: Basic Metabolic Panel:  Recent  Labs Lab 04/11/13 1300 04/13/13 0655 04/14/13 0540 04/15/13 1400 04/16/13 0605  NA 141 144 143 142 143  K 4.5 4.2 3.9 4.3 4.4  CL 107 112 103 105 106  CO2 24 24 30 27 29   GLUCOSE 136* 133* 179* 187* 149*  BUN 26* 22 35* 32* 36*  CREATININE 1.03 0.98 1.41* 1.42* 1.15*  CALCIUM 9.1 8.7 9.3 8.9 9.0   Liver Function Tests:  Recent Labs Lab 04/10/13 1455 04/11/13 1300 04/15/13 1400  AST 152* 260* 26  ALT 211* 319* 94*  ALKPHOS 209* 273* 177*  BILITOT 0.4 0.3 0.2*  PROT 6.6 6.9 6.8  ALBUMIN 2.0* 2.4* 2.5*   No results found for this basename: LIPASE, AMYLASE,  in the last 168 hours No results found for this basename: AMMONIA,  in the last 168 hours CBC:  Recent Labs Lab 04/13/13 0655 04/14/13 0540 04/15/13 0458 04/15/13 1400 04/16/13 0605  WBC 15.6* 17.6* 19.7* 20.3* 19.9*  NEUTROABS  --  12.5*  --   --   --   HGB 10.3* 11.1* 10.5* 11.0* 9.9*  HCT 29.5* 31.7* 30.3* 31.6* 28.6*  MCV 83.3 83.6 84.6 86.3 85.4  PLT 654* 814* 837* 882* 891*   Cardiac Enzymes: No results found for this basename: CKTOTAL, CKMB, CKMBINDEX, TROPONINI,  in the last 168 hours BNP (last 3 results)  Recent Labs  04/12/13 1822 04/14/13 0540  PROBNP 1377.0* 671.6*   CBG:  Recent Labs Lab 04/15/13 1110 04/15/13 1610 04/15/13 2138 04/16/13 0824 04/16/13 1203  GLUCAP 162* 173* 320* 137* 182*    Recent Results (from the past 240 hour(s))  CULTURE, BLOOD (ROUTINE X 2)     Status: None   Collection Time    04/07/13 11:00 PM      Result Value Range Status   Specimen Description BLOOD RIGHT ARM   Final   Special Requests BOTTLES DRAWN AEROBIC AND ANAEROBIC 10CC EACH   Final   Culture  Setup Time     Final   Value: 04/08/2013 13:19     IDENTIFIED AS AEROCOCCUS VIRIDANS Standardized susceptibility testing for this organism is not available.     Performed at [facility]     Final   Value: ACINETOBACTER [id number]     STAPHYLOCOCCUS SPECIES (COAGULASE NEGATIVE)      AEROCOCCUS SPECIES     Note: THE SIGNIFICANCE OF ISOLATING THIS ORGANISM FROM A SINGLE SET OF BLOOD CULTURES WHEN MULTIPLE SETS ARE DRAWN IS UNCERTAIN. PLEASE NOTIFY THE MICROBIOLOGY DEPARTMENT WITHIN ONE WEEK IF SPECIATION AND SENSITIVITIES ARE REQUIRED.     Note: Gram Stain Report Called to,Read Back By and Verified With: NIKKI MURPHY 04/08/2013 [place]   Report Status 04/16/2013 FINAL   Final   Organism ID, Bacteria ACINETOBACTER [id number]   Final  CULTURE, BLOOD (ROUTINE X 2)     Status: None   Collection Time    04/07/13 11:04 PM      Result Value Range Status   Specimen Description BLOOD RIGHT HAND   Final   Special Requests BOTTLES DRAWN AEROBIC ONLY 2CC   Final   Culture  Setup Time     Final   Value: 04/08/2013 13:19     Performed at [facility]   Culture     Final   Value: NO GROWTH 5 DAYS     Performed at [facility]   Report Status 04/14/2013 FINAL   Final  BODY FLUID CULTURE     Status: None   Collection Time    04/08/13  1:00 PM      Result Value Range Status   Specimen Description FLUID SYNOVIAL LEFT KNEE   Final   Special Requests PATIENT ON FOLLOWING VANCO   Final   Gram Stain     Final   Value: MODERATE WBC PRESENT, PREDOMINANTLY MONONUCLEAR     NO ORGANISMS SEEN     Performed at [facility]   Culture     Final   Value: NO GROWTH 3 DAYS     Performed at [facility]   Report Status 04/12/2013 FINAL   Final  ANAEROBIC CULTURE     Status: None   Collection Time    04/08/13  1:00 PM      Result Value Range Status   Specimen Description FLUID SYNOVIAL LEFT KNEE   Final   Special Requests Immunocompromised   Final   Gram Stain     Final   Value: MODERATE WBC PRESENT,BOTH PMN AND MONONUCLEAR     NO ORGANISMS SEEN     Performed at [facility]   Culture     Final   Value: NO ANAEROBES ISOLATED     Performed at [facility]   Report Status 04/13/2013 FINAL   Final  CULTURE, BLOOD (ROUTINE X 2)     Status: None    Collection Time    04/13/13  7:00 PM      Result Value Range Status   Specimen Description BLOOD RIGHT ARM   Final   Special Requests BOTTLES DRAWN AEROBIC AND ANAEROBIC 10CC   Final   Culture  Setup Time     Final   Value: 04/13/2013 22:26     Performed at [facility]   Culture     Final   Value:        BLOOD CULTURE RECEIVED NO GROWTH TO DATE CULTURE WILL BE HELD FOR 5 DAYS BEFORE ISSUING A FINAL NEGATIVE REPORT     Performed at [facility]   Report Status PENDING   Incomplete  CULTURE, BLOOD (ROUTINE X 2)     Status: None   Collection Time    04/13/13  7:05 PM      Result Value Range Status   Specimen Description BLOOD RIGHT HAND   Final   Special Requests BOTTLES DRAWN AEROBIC ONLY 10CC   Final   Culture  Setup Time     Final   Value: 04/13/2013 22:26     Performed at [facility]   Culture     Final   Value:        BLOOD CULTURE RECEIVED NO GROWTH TO DATE CULTURE WILL BE HELD FOR 5 DAYS BEFORE ISSUING A FINAL NEGATIVE REPORT     Performed at [facility]   Report Status PENDING   Incomplete  CLOSTRIDIUM DIFFICILE BY PCR     Status: None   Collection Time    04/15/13 11:16 AM      Result Value Range Status   C difficile by pcr NEGATIVE  NEGATIVE Final     Studies: No results found.  Scheduled Meds: . feeding supplement (GLUCERNA SHAKE)  237 mL Oral TID BM  .  gabapentin  600 mg Oral BID  . heparin  5,000 Units Subcutaneous Q8H  . insulin aspart  0-15 Units Subcutaneous TID WC  . insulin aspart  0-5 Units Subcutaneous QHS  . polyethylene glycol  17 g Oral Daily   Continuous Infusions:      Time spent: 25 minutes    [facility] Pager [phone] If 7PM-7AM, please contact night-coverage at [email or web address], password [facility] 04/16/2013, 1:29 PM  LOS: 9 days

## 2013-04-16 NOTE — Social Work (Signed)
Updated [facility] of probable d/c tomorrow- they have availability and are expecting patient if stable- CSW will f/u tomorrow for further [place], MSW, [place]

## 2013-04-17 ENCOUNTER — Telehealth: Payer: Self-pay | Admitting: Internal Medicine

## 2013-04-17 DIAGNOSIS — I5033 Acute on chronic diastolic (congestive) heart failure: Secondary | ICD-10-CM

## 2013-04-17 DIAGNOSIS — E1149 Type 2 diabetes mellitus with other diabetic neurological complication: Secondary | ICD-10-CM

## 2013-04-17 LAB — CBC
MCV: 85.1 fL (ref 78.0–100.0)
Platelets: 958 10*3/uL (ref 150–400)
RDW: 15.9 % — ABNORMAL HIGH (ref 11.5–15.5)
WBC: 20.5 10*3/uL — ABNORMAL HIGH (ref 4.0–10.5)

## 2013-04-17 LAB — GLUCOSE, CAPILLARY
Glucose-Capillary: 260 mg/dL — ABNORMAL HIGH (ref 70–99)
Glucose-Capillary: 131 mg/dL — ABNORMAL HIGH (ref 70–99)
Glucose-Capillary: 156 mg/dL — ABNORMAL HIGH (ref 70–99)

## 2013-04-17 MED ORDER — GLUCERNA SHAKE PO LIQD: 237.0000 mL | Freq: Three times a day (TID) | ORAL | Status: DC

## 2013-04-17 MED ORDER — MELOXICAM 15 MG PO TABS: 15.0000 mg | ORAL_TABLET | Freq: Every day | ORAL | Status: DC

## 2013-04-17 MED ORDER — OXYCODONE HCL 10 MG PO TABS: 5.0000 mg | ORAL_TABLET | Freq: Four times a day (QID) | ORAL | Status: DC | PRN

## 2013-04-17 MED ORDER — BENAZEPRIL HCL 20 MG PO TABS: 20.0000 mg | ORAL_TABLET | Freq: Every day | ORAL | Status: DC

## 2013-04-17 MED ORDER — METFORMIN HCL ER 500 MG PO TB24: 500.0000 mg | ORAL_TABLET | Freq: Two times a day (BID) | ORAL | Status: DC

## 2013-04-17 MED ORDER — HYDROCHLOROTHIAZIDE 25 MG PO TABS: 25.0000 mg | ORAL_TABLET | Freq: Every day | ORAL | Status: DC

## 2013-04-17 MED ORDER — AMLODIPINE BESYLATE 10 MG PO TABS: 10.0000 mg | ORAL_TABLET | Freq: Every day | ORAL | Status: DC

## 2013-04-17 MED ORDER — METFORMIN HCL ER 500 MG PO TB24
500.0000 mg | ORAL_TABLET | Freq: Two times a day (BID) | ORAL | Status: DC
  Administered 2013-04-17: 500 mg via ORAL
  Filled 2013-04-17 (×2): qty 1

## 2013-04-17 MED ORDER — POLYETHYLENE GLYCOL 3350 17 G PO PACK: 17.0000 g | PACK | Freq: Every day | ORAL | Status: DC | PRN

## 2013-04-17 MED ORDER — MELOXICAM 15 MG PO TABS
15.0000 mg | ORAL_TABLET | Freq: Every day | ORAL | Status: DC
  Administered 2013-04-17: 15 mg via ORAL
  Filled 2013-04-17: qty 1

## 2013-04-17 MED ORDER — ACETAMINOPHEN 325 MG PO TABS: 650.0000 mg | ORAL_TABLET | Freq: Four times a day (QID) | ORAL | Status: DC | PRN

## 2013-04-17 NOTE — Progress Notes (Signed)
Pt discharged to facility via ambulance. Pt is hemodynamically stable. No complaints of pain. Dorthey Sawyer

## 2013-04-17 NOTE — Progress Notes (Signed)
Doctor appointment scheduled for patient at [facility] for November 25 at 1330 with Dr Earlie Server. Nurse notified at [facility] during report.

## 2013-04-17 NOTE — Telephone Encounter (Signed)
S/w nurse and gve np appt 11/25 @ 1:30 w/Dr. Julien Nordmann Dx- Leukocytosis Welcome packet mailed.

## 2013-04-17 NOTE — Discharge Summary (Signed)
Physician Discharge Summary  Sarah Colon [id number] DOB: 1945-06-16 DOA: 04/07/2013  PCP: Salena Saner., MD  Admit date: 04/07/2013 Discharge date: 04/17/2013  Time spent: 35 minutes  Recommendations for Outpatient Follow-up:  1. Patient will follow up with her PCP in the next one month. 2. She's been discharged to skilled nursing facility which will get physical therapy 3. She'll followup with hematology in the next few weeks as an outpatient at the regional [place]. They will evaluate her persistent leukocytosis and thrombocytosis and look for possible myeloproliferative disorder. 4. Patient was noted to be on 2 combination hypertensive drugs, and ARB//HCTZ in one and an amlodipine/ACE inhibitor and the other. Both combination drugs were discontinued. She's been given prescriptions separately for the HCTZ, ACE inhibitor and amlodipine. The combination ACE inhibitor/amlodipine drug was not covered in her insurance plan  Discharge Diagnoses:  Principal Problem:   Leukocytosis, unspecified Active Problems:   Type 2 diabetes mellitus with diabetic neuropathic arthropathy   Knee pain   Sepsis   Septic arthritis of knee   Acute gouty arthritis   Fatty liver   Transaminitis   Acute on chronic diastolic heart failure   Unspecified constipation   Discharge Condition: Improved, being discharged to skilled nursing facility  Diet recommendation: Carb modified heart healthy  Filed Weights   04/14/13 2112 04/15/13 2136 04/16/13 2102  Weight: 56.3 kg (124 lb 1.9 oz) 56.3 kg (124 lb 1.9 oz) 56.3 kg (124 lb 1.9 oz)    History of present illness:  On 11/9:Sarah Colon is a 67 y.o. female with Past medical history of arthritis, hypertension, diabetes, skin grafting.  The patient is coming from home.  She presents today with the complaint of generalized malaise and knee pain. She mentions that she has chronic knee pain and has been evaluated by her PCP as an outpatient  was diagnosed with arthritis and was treated with tramadol recently. But her pain continued to get worse and since last 3 days she has been having worsening pain in her knee bilaterally more on the left than on the right associated with difficulty ambulating. She came into the emergency room was noted to have elevated sed rate, CRP and a fever of 101 as well as a leukocytosis of 20.  Orthopedic surgery was consulted and patient was noted to have an effusion of her left knee joint which was tapped. This effusion noted white blood cell count 12,000 mono sodium urate crystals.   Hospital Course:  Sepsis secondary to acute polyarticular gouty arthritis  Added empiric IV vancomycin and cefepime on admission for possible septic arthritis given fever and leukocytosis... Noted elevated ESR, CRP and CPK.. 1/2 blood cx on admission growing GPC and GN coccobacilli. Likely contaminant and antibiotic discontinued on 11/11  -Left Knee joint tapped by orthopedics . Fluid showing wbc of 12k and MSU. cx so far no growth. Elevated uric acid level. placed her on naproxen bid. Joint swelling has resolved.  Continue pain control.  2-D echo negative for vegetation. HIV antibody negative  -Appreciate orthopedics and ID consult.   Leukocytosis  Patient came in with a white blood cell count of around 20. Home treatment of her gouty arthropathy, her white blood cell count improved and was down to as low as 14 x 11/14. Over the next several days, however he continued to trend up along with a persistent thrombocytosis. Other sources of infection were obtained. Urine and chest x-ray were unremarkable. Lotrimin Doppler negative for DVT. PCR sent for C.  difficile. And discovered constipation, this was aggressively treated. Stress margination was also considered, especially given her CHF which was mild. However despite diuresis and previous antibiotics and treatment of other issues as described above, patient white blood cell count  continue to elevate. Patient has a previous labs prior to this admission. Her white blood cell count on day of discharge was 20 with a thrombocytosis of approximately 900. I discussed this with infectious disease who did not feel that this was infection. Discussed this with hematology who felt that this could be benign given patient's clinical state. They recommended her following up with them in the office after discharge to evaluate for myeloproliferative disorder.   Constipation: Patient had not had a bowel movement for approximately for 6 days. Started MiraLAX and Colace. She had several days of multiple bowel movements and was feeling much better by 11/18.  Acute on Chronic diastolic heart failure: Patient was noted to have an elevated BNP on admission. Echocardiogram noted grade 1 diastolic dysfunction. Patient was started on IV Lasix and responded well. His incontinence, unable to properly chart how much she diuresed. BNP bilateral/15 was done 670. Lasix was discontinued by 11/16  Transaminitis  liver US shows fatty liver.. hepatitis panel negative. Elevated CPK on admission. UA unremarkable  LFTs and CPK improving. Suspected secondary hepatic congestion from volume overload see above.   Hypertension  Blood pressure stable. She had some adjustments made to her combination hypertensive drugs as listed above.  Hypokalemia  replenished   generalized weakness  Patient has residual leg weakness likely from being bedbound for almost 2 weeks.  She was evaluated by physical therapy who recommended short-term skilled nursing. Patient is an acceptable go today.  Diabetes mellitus with peripheral neuropathy: Moderately well-controlled on metformin. 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 Have restarted her Neurontin   Procedures:  Status post left knee joint aspiration done 11/9  Consultations:  Infectious disease  Orthopedic surgery  Discharge Exam: Filed Vitals:   04/17/13 0805   BP: 141/73  Pulse: 90  Temp: 98.1 F (36.7 C)  Resp: 20    General: Alert and oriented x3, no acute distress Cardiovascular: Regular rate and rhythm, S1-S2 Respiratory: Clear to auscultation bilaterally Abdomen: Soft, nontender, nondistended, positive bowel sounds Extremities, clubbing or cyanosis, minimal swelling of the left knee mildly tender  Discharge Instructions  Discharge Orders   Future Appointments Provider Department Dept Phone   04/24/2013 1:30 PM [place] [phone]   04/24/2013 1:45 PM [place] [phone]   04/24/2013 2:15 PM Curt Bears, MD [place] [phone]   Future Orders Complete By Expires   Diet - low sodium heart healthy  As directed    Diet Carb Modified  As directed    Increase activity slowly  As directed        Medication List    STOP taking these medications       amLODipine-benazepril 10-20 MG per capsule  Commonly known as:  LOTREL     lidocaine 5 %  Commonly known as:  LIDODERM     valsartan-hydrochlorothiazide 320-25 MG per tablet  Commonly known as:  DIOVAN-HCT      TAKE these medications       acetaminophen 325 MG tablet  Commonly known as:  TYLENOL  Take 2 tablets (650 mg total) by mouth every 6 (six) hours as needed for mild pain (or Fever >/= 101).  amLODipine 10 MG tablet  Commonly known as:  NORVASC  Take 1 tablet (10 mg total) by mouth daily.     benazepril 20 MG tablet  Commonly known as:  LOTENSIN  Take 1 tablet (20 mg total) by mouth daily.     bimatoprost 0.01 % Soln  Commonly known as:  LUMIGAN  Place 1 drop into both eyes at bedtime.     brimonidine 0.1 % Soln  Commonly known as:  ALPHAGAN P  Place 1 drop into both eyes 3 (three) times daily.     dorzolamide-timolol 22.3-6.8 MG/ML ophthalmic solution  Commonly known as:  COSOPT  Place 1 drop  into both eyes 2 (two) times daily.     feeding supplement (GLUCERNA SHAKE) Liqd  Take 237 mLs by mouth 3 (three) times daily between meals.     gabapentin 600 MG tablet  Commonly known as:  NEURONTIN  Take 600 mg by mouth 2 (two) times daily.     hydrochlorothiazide 25 MG tablet  Commonly known as:  HYDRODIURIL  Take 1 tablet (25 mg total) by mouth daily.     meloxicam 15 MG tablet  Commonly known as:  MOBIC  Take 1 tablet (15 mg total) by mouth daily.     metFORMIN 500 MG 24 hr tablet  Commonly known as:  GLUCOPHAGE-XR  Take 1 tablet (500 mg total) by mouth 2 (two) times daily.     Oxycodone HCl 10 MG Tabs  Take 0.5 tablets (5 mg total) by mouth every 6 (six) hours as needed (Pain).     traMADol 50 MG tablet  Commonly known as:  ULTRAM  Take 50 mg by mouth 3 (three) times daily as needed (pain).       Allergies  Allergen Reactions  . Codeine Other (See Comments)    Causes shaking       Follow-up Information   Follow up with Salena Saner., MD In 1 month.   Specialty:  Internal Medicine   Contact information:   [place] [address] [phone]       Follow up with [facility] In 1 week. ([place], [place])        The results of significant diagnostics from this hospitalization (including imaging, microbiology, ancillary and laboratory) are listed below for reference.    Significant Diagnostic Studies: Dg Chest 2 View  04/07/2013   IMPRESSION: No active cardiopulmonary disease.   Electronically Signed   By: Skipper Cliche M.D.   On: 04/07/2013 22:32   Dg Abd 1 View  04/16/2013    IMPRESSION: Nonobstructive bowel gas pattern with moderate colonic and rectal fecal material.   Electronically Signed   By: Arne Cleveland M.D.   On: 04/16/2013 18:31   Dg Abd 1 View  04/14/2013     IMPRESSION: 1. Moderate stool at the rectus sigmoid colon and ascending colon without evidence for  obstruction. 2. Suspect calcified uterine fibroid.   Electronically Signed   By: Lawrence Santiago M.D.   On: 04/14/2013 11:13   US Abdomen Complete  04/10/2013    IMPRESSION: Single 1.6 cm gallstone without evidence of cholecystitis.  Fatty infiltration of the liver.  Findings compatible with medical renal disease.  Mildly prominent common bile duct without frank dilatation. Negative for intrahepatic biliary ductal dilatation.   Electronically Signed   By: Inge Rise M.D.   On: 04/10/2013 07:21   Dg Knee Complete 4 Views Left  04/08/2013  IMPRESSION: 1. No evidence of fracture or dislocation. 2. Moderate joint effusion noted.   Electronically Signed   By: Garald Balding M.D.   On: 04/08/2013 01:12   Dg Knee Complete 4 Views Right  04/08/2013     IMPRESSION: 1. No acute osseous abnormality about the knee. 2. Moderate degenerative spurring at the intercondylar eminences and superior pole of the patella. .   Electronically Signed   By: Jeannine Boga M.D.   On: 04/08/2013 01:15    Microbiology: Recent Results (from the past 240 hour(s))  CULTURE, BLOOD (ROUTINE X 2)     Status: None   Collection Time    04/07/13 11:00 PM      Result Value Range Status   Specimen Description BLOOD RIGHT ARM   Final   Special Requests BOTTLES DRAWN AEROBIC AND ANAEROBIC 10CC EACH   Final   Culture  Setup Time     Final   Value: 04/08/2013 13:19     IDENTIFIED AS AEROCOCCUS VIRIDANS Standardized susceptibility testing for this organism is not available.     Performed at [facility]     Final   Value: ACINETOBACTER [id number]     STAPHYLOCOCCUS SPECIES (COAGULASE NEGATIVE)     AEROCOCCUS SPECIES     Note: THE SIGNIFICANCE OF ISOLATING THIS ORGANISM FROM A SINGLE SET OF BLOOD CULTURES WHEN MULTIPLE SETS ARE DRAWN IS UNCERTAIN. PLEASE NOTIFY THE MICROBIOLOGY DEPARTMENT WITHIN ONE WEEK IF SPECIATION AND SENSITIVITIES ARE REQUIRED.     Note: Gram Stain Report Called to,Read Back By and  Verified With: NIKKI MURPHY 04/08/2013 [place]   Report Status 04/16/2013 FINAL   Final   Organism ID, Bacteria ACINETOBACTER [id number]   Final  CULTURE, BLOOD (ROUTINE X 2)     Status: None   Collection Time    04/07/13 11:04 PM      Result Value Range Status   Specimen Description BLOOD RIGHT HAND   Final   Special Requests BOTTLES DRAWN AEROBIC ONLY 2CC   Final   Culture  Setup Time     Final   Value: 04/08/2013 13:19     Performed at [facility]   Culture     Final   Value: NO GROWTH 5 DAYS     Performed at [facility]   Report Status 04/14/2013 FINAL   Final  BODY FLUID CULTURE     Status: None   Collection Time    04/08/13  1:00 PM      Result Value Range Status   Specimen Description FLUID SYNOVIAL LEFT KNEE   Final   Special Requests PATIENT ON FOLLOWING VANCO   Final   Gram Stain     Final   Value: MODERATE WBC PRESENT, PREDOMINANTLY MONONUCLEAR     NO ORGANISMS SEEN     Performed at [facility]   Culture     Final   Value: NO GROWTH 3 DAYS     Performed at [facility]   Report Status 04/12/2013 FINAL   Final  ANAEROBIC CULTURE     Status: None   Collection Time    04/08/13  1:00 PM      Result Value Range Status   Specimen Description FLUID SYNOVIAL LEFT KNEE   Final   Special Requests Immunocompromised   Final   Gram Stain     Final   Value: MODERATE WBC PRESENT,BOTH PMN AND MONONUCLEAR     NO ORGANISMS SEEN  Performed at [facility]     Final   Value: NO ANAEROBES ISOLATED     Performed at [facility]   Report Status 04/13/2013 FINAL   Final  CULTURE, BLOOD (ROUTINE X 2)     Status: None   Collection Time    04/13/13  7:00 PM      Result Value Range Status   Specimen Description BLOOD RIGHT ARM   Final   Special Requests BOTTLES DRAWN AEROBIC AND ANAEROBIC 10CC   Final   Culture  Setup Time     Final   Value: 04/13/2013 22:26     Performed at [facility]   Culture     Final    Value:        BLOOD CULTURE RECEIVED NO GROWTH TO DATE CULTURE WILL BE HELD FOR 5 DAYS BEFORE ISSUING A FINAL NEGATIVE REPORT     Performed at [facility]   Report Status PENDING   Incomplete  CULTURE, BLOOD (ROUTINE X 2)     Status: None   Collection Time    04/13/13  7:05 PM      Result Value Range Status   Specimen Description BLOOD RIGHT HAND   Final   Special Requests BOTTLES DRAWN AEROBIC ONLY 10CC   Final   Culture  Setup Time     Final   Value: 04/13/2013 22:26     Performed at [facility]   Culture     Final   Value:        BLOOD CULTURE RECEIVED NO GROWTH TO DATE CULTURE WILL BE HELD FOR 5 DAYS BEFORE ISSUING A FINAL NEGATIVE REPORT     Performed at [facility]   Report Status PENDING   Incomplete  CLOSTRIDIUM DIFFICILE BY PCR     Status: None   Collection Time    04/15/13 11:16 AM      Result Value Range Status   C difficile by pcr NEGATIVE  NEGATIVE Final     Labs: Basic Metabolic Panel:  Recent Labs Lab 04/11/13 1300 04/13/13 0655 04/14/13 0540 04/15/13 1400 04/16/13 0605  NA 141 144 143 142 143  K 4.5 4.2 3.9 4.3 4.4  CL 107 112 103 105 106  CO2 24 24 30 27 29   GLUCOSE 136* 133* 179* 187* 149*  BUN 26* 22 35* 32* 36*  CREATININE 1.03 0.98 1.41* 1.42* 1.15*  CALCIUM 9.1 8.7 9.3 8.9 9.0   Liver Function Tests:  Recent Labs Lab 04/10/13 1455 04/11/13 1300 04/15/13 1400  AST 152* 260* 26  ALT 211* 319* 94*  ALKPHOS 209* 273* 177*  BILITOT 0.4 0.3 0.2*  PROT 6.6 6.9 6.8  ALBUMIN 2.0* 2.4* 2.5*   No results found for this basename: LIPASE, AMYLASE,  in the last 168 hours No results found for this basename: AMMONIA,  in the last 168 hours CBC:  Recent Labs Lab 04/14/13 0540 04/15/13 0458 04/15/13 1400 04/16/13 0605 04/17/13 0718  WBC 17.6* 19.7* 20.3* 19.9* 20.5*  NEUTROABS 12.5*  --   --   --   --   HGB 11.1* 10.5* 11.0* 9.9* 10.1*  HCT 31.7* 30.3* 31.6* 28.6* 29.2*  MCV 83.6 84.6 86.3 85.4 85.1  PLT 814* 837*  882* 891* 958*   Cardiac Enzymes: No results found for this basename: CKTOTAL, CKMB, CKMBINDEX, TROPONINI,  in the last 168 hours BNP: BNP (last 3 results)  Recent Labs  04/12/13 1822 04/14/13 0540  PROBNP  1377.0* 671.6*   CBG:  Recent Labs Lab 04/16/13 1203 04/16/13 1708 04/16/13 2101 04/17/13 0857 04/17/13 1151  GLUCAP 182* 140* 209* 260* 131*       Signed:  KRISHNAN,SENDIL K  [facility] 04/17/2013, 12:59 PM

## 2013-04-17 NOTE — Telephone Encounter (Signed)
C/D 04/17/13 for appt. 11/25

## 2013-04-17 NOTE — Social Work (Signed)
Patient for d/c today to SNF bed at  [facility] and patient agreeable to this plan- plan transfer via EMS. Eduard Clos, MSW, [place]

## 2013-04-17 NOTE — Progress Notes (Signed)
Inpatient Diabetes Program Recommendations  AACE/ADA: New Consensus Statement on Inpatient Glycemic Control (2013)  Target Ranges:  Prepandial:   less than 140 mg/dL      Peak postprandial:   less than 180 mg/dL (1-2 hours)      Critically ill patients:  140 - 180 mg/dL   Consult received regarding insulin recommendations: Glucose levels have been okay, except fastin glucose this am of 260 mg/dL I would recommend Lantus at 10 units daily or HS, but I have so few fastings to consider the need. Also noted the consult was d/c'd.  Glad to address if further needs. Thank you, Rosita Kea, RN, CNS, Diabetes Coordinator [phone])

## 2013-04-17 NOTE — Plan of Care (Signed)
Problem: Discharge Progression Outcomes Goal: Other Discharge Outcomes/Goals Outcome: Completed/Met Date Met:  04/17/13 Pt discharged to [facility] via ambulance

## 2013-04-18 ENCOUNTER — Non-Acute Institutional Stay (SKILLED_NURSING_FACILITY): Payer: Medicare Other | Admitting: Internal Medicine

## 2013-04-18 ENCOUNTER — Other Ambulatory Visit: Payer: Self-pay | Admitting: *Deleted

## 2013-04-18 ENCOUNTER — Encounter: Payer: Self-pay | Admitting: Internal Medicine

## 2013-04-18 DIAGNOSIS — E11610 Type 2 diabetes mellitus with diabetic neuropathic arthropathy: Secondary | ICD-10-CM

## 2013-04-18 DIAGNOSIS — I10 Essential (primary) hypertension: Secondary | ICD-10-CM

## 2013-04-18 DIAGNOSIS — G8929 Other chronic pain: Secondary | ICD-10-CM | POA: Insufficient documentation

## 2013-04-18 DIAGNOSIS — D72829 Elevated white blood cell count, unspecified: Secondary | ICD-10-CM

## 2013-04-18 DIAGNOSIS — E1149 Type 2 diabetes mellitus with other diabetic neurological complication: Secondary | ICD-10-CM

## 2013-04-18 DIAGNOSIS — I5033 Acute on chronic diastolic (congestive) heart failure: Secondary | ICD-10-CM

## 2013-04-18 DIAGNOSIS — M109 Gout, unspecified: Secondary | ICD-10-CM

## 2013-04-18 DIAGNOSIS — R5381 Other malaise: Secondary | ICD-10-CM

## 2013-04-18 MED ORDER — TRAMADOL HCL 50 MG PO TABS: 50.0000 mg | ORAL_TABLET | Freq: Three times a day (TID) | ORAL | Status: DC | PRN

## 2013-04-18 MED ORDER — OXYCODONE HCL 5 MG PO TABS: ORAL_TABLET | ORAL | Status: DC

## 2013-04-18 NOTE — Assessment & Plan Note (Signed)
Fatty liver by [place];Hepatits panel neg, elevated CPK also;both improved with tx CHF and felt tobe hepatic congestion from that

## 2013-04-18 NOTE — Assessment & Plan Note (Signed)
Pt presented with knee pain, fever and WBC 20; knee dx as gout but WBC fluctuated up and down;ID did not feel infectiois;Hem-Onc will w/u as outpt to r/o MPD and others

## 2013-04-18 NOTE — Assessment & Plan Note (Signed)
Dx by tap showing urate crystals;treated with naproxen and it improved

## 2013-04-18 NOTE — Assessment & Plan Note (Signed)
Some meds were rearranged in hospital and they will be continued as changed;Bp good

## 2013-04-18 NOTE — Assessment & Plan Note (Signed)
After lying in bed for 2 weeks, pt is weak; OT/PT

## 2013-04-18 NOTE — Assessment & Plan Note (Signed)
Elevated BMP on admit, improved with Lasix

## 2013-04-18 NOTE — Assessment & Plan Note (Addendum)
Well controlled on metformin;Hba1c was 6.5;pt on Neurontin for neuropathy will increase dosing from 600mg  BID to TID

## 2013-04-18 NOTE — Progress Notes (Signed)
MRN: [id number] Name: Sarah Colon  Sex: female Age: 67 y.o. DOB: April 05, 1946  [place] #: [facility] Facility/Room: [id number] Level Of Care: SNF Provider: Inocencio Homes D Emergency Contacts: Extended Emergency Contact Information Primary Emergency Contact: [place] [facility] [place] Mobile Phone: [phone] Relation: Daughter  Code Status: FULL  Allergies: Codeine  Chief Complaint  Patient presents with  . nursing home admission    HPI: Patient is 67 y.o. female who is admitted to SNF for PT/OT after hospital stay for what was thought might be septic arthritis but which was really gout. Pt has other med problems as well.   Past Medical History  Diagnosis Date  . Arthritis   . Diabetes mellitus without complication   . Chronic pain of left knee   . Gout dx 04/09/2013    knee tapped by ortho with monosodium urate crystals  . Hypertension     Past Surgical History  Procedure Laterality Date  . Skin grafts        Medication List       This list is accurate as of: 04/18/13  8:12 PM.  Always use your most recent med list.               acetaminophen 325 MG tablet  Commonly known as:  TYLENOL  Take 2 tablets (650 mg total) by mouth every 6 (six) hours as needed for mild pain (or Fever >/= 101).     amLODipine 10 MG tablet  Commonly known as:  NORVASC  Take 1 tablet (10 mg total) by mouth daily.     benazepril 20 MG tablet  Commonly known as:  LOTENSIN  Take 1 tablet (20 mg total) by mouth daily.     bimatoprost 0.01 % Soln  Commonly known as:  LUMIGAN  Place 1 drop into both eyes at bedtime.     brimonidine 0.1 % Soln  Commonly known as:  ALPHAGAN P  Place 1 drop into both eyes 3 (three) times daily.     dorzolamide-timolol 22.3-6.8 MG/ML ophthalmic solution  Commonly known as:  COSOPT  Place 1 drop into both eyes 2 (two) times daily.     feeding supplement (GLUCERNA SHAKE) Liqd  Take 237 mLs by mouth 3 (three) times  daily between meals.     gabapentin 600 MG tablet  Commonly known as:  NEURONTIN  Take 600 mg by mouth 2 (two) times daily.     hydrochlorothiazide 25 MG tablet  Commonly known as:  HYDRODIURIL  Take 1 tablet (25 mg total) by mouth daily.     meloxicam 15 MG tablet  Commonly known as:  MOBIC  Take 1 tablet (15 mg total) by mouth daily.     metFORMIN 500 MG 24 hr tablet  Commonly known as:  GLUCOPHAGE-XR  Take 1 tablet (500 mg total) by mouth 2 (two) times daily.     Oxycodone HCl 10 MG Tabs  Take 0.5 tablets (5 mg total) by mouth every 6 (six) hours as needed (Pain).     oxyCODONE 5 MG immediate release tablet  Commonly known as:  Oxy IR/ROXICODONE  Take one tablet by mouth every 6 hours as needed for pain     traMADol 50 MG tablet  Commonly known as:  ULTRAM  Take 1 tablet (50 mg total) by mouth 3 (three) times daily as needed (pain).        No orders of the defined types were  placed in this encounter.     There is no immunization history on file for this patient.  History  Substance Use Topics  . Smoking status: Former Research scientist (life sciences)  . Smokeless tobacco: Not on file  . Alcohol Use: No    Family history is noncontributory    Review of Systems  DATA OBTAINED: from patient GENERAL: Feels well no fevers, fatigue, appetite changes SKIN: No itching, rash or wounds EYES: No eye pain, redness, discharge EARS: No earache, tinnitus, change in hearing NOSE: No congestion, drainage or bleeding  MOUTH/THROAT: No mouth or tooth pain, No sore throat, No difficulty chewing or swallowing  RESPIRATORY: No cough, wheezing, SOB CARDIAC: No chest pain, palpitations, lower extremity edema  GI: No abdominal pain, No N/V/D or constipation, No heartburn or reflux  GU: No dysuria, frequency or urgency, or incontinence  MUSCULOSKELETAL: leg pain, chronic;maybe pain med is not lasting long enough NEUROLOGIC: No headache, dizziness or focal weakness PSYCHIATRIC: No overt anxiety or  sadness. Sleeps well. No behavior issue.   Filed Vitals:   04/18/13 1054  BP: 118/74  Pulse: 92  Temp: 99.3 F (37.4 C)  Resp: 18    Physical Exam  GENERAL APPEARANCE: Alert, conversant. Appropriately groomed. No acute distress.  SKIN: No diaphoresis rash, or wounds HEAD: Normocephalic, atraumatic  EYES: Conjunctiva/lids clear. Pupils round, reactive. EOMs intact.  EARS: External exam WNL, canals clear. Hearing grossly normal.  NOSE: No deformity or discharge.  MOUTH/THROAT: Lips w/o lesions.  RESPIRATORY: Breathing is even, unlabored. Lung sounds are clear   CARDIOVASCULAR: Heart RRR no murmurs, rubs or gallops. No peripheral edema.   VENOUS: No varicosities. No venous stasis skin changes  GASTROINTESTINAL: Abdomen is soft, non-tender, not distended w/ normal bowel sounds. No mass, ventral or inguinal hernia. No organomegally GENITOURINARY: Bladder non tender, not distended  MUSCULOSKELETAL: L knee with small effusion;no redness, minimal tender, very mild heat NEUROLOGIC: Oriented X3. Cranial nerves 2-12 grossly intact. Moves all extremities no tremor. PSYCHIATRIC: Mood and affect appropriate to situation, no behavioral issues  Patient Active Problem List   Diagnosis Date Noted  . Physical deconditioning 04/18/2013  . Chronic pain of left knee   . Gout   . Hypertension   . Unspecified constipation 04/14/2013  . Leukocytosis, unspecified 04/13/2013  . Acute on chronic diastolic heart failure [id number]  . Chronic diastolic heart failure 123456  . Fatty liver 04/11/2013  . Transaminitis 04/11/2013  . Acute gouty arthritis 04/10/2013  . 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 Knee pain 04/08/2013  . Sepsis 04/08/2013  . Septic arthritis of knee 04/08/2013    CBC    Component Value Date/Time   WBC 20.5* 04/17/2013 0718   RBC 3.43* 04/17/2013 0718   HGB 10.1* 04/17/2013 0718   HCT 29.2* 04/17/2013 0718   PLT 958* 04/17/2013  0718   MCV 85.1 04/17/2013 0718   LYMPHSABS 3.6 04/14/2013 0540   MONOABS 1.3* 04/14/2013 0540   EOSABS 0.2 04/14/2013 0540   BASOSABS 0.1 04/14/2013 0540    CMP     Component Value Date/Time   NA 143 04/16/2013 0605   K 4.4 04/16/2013 0605   CL 106 04/16/2013 0605   CO2 29 04/16/2013 0605   GLUCOSE 149* 04/16/2013 0605   BUN 36* 04/16/2013 0605   CREATININE 1.15* 04/16/2013 0605   CALCIUM 9.0 04/16/2013 0605   PROT 6.8 04/15/2013 1400   ALBUMIN 2.5* 04/15/2013 1400   AST 26 04/15/2013 1400   ALT 94* 04/15/2013  1400   ALKPHOS 177* 04/15/2013 1400   BILITOT 0.2* 04/15/2013 1400   GFRNONAA 47* 04/16/2013 0605   GFRAA 55* 04/16/2013 0605    Assessment and Plan  Leukocytosis, unspecified Pt presented with knee pain, fever and WBC 20; knee dx as gout but WBC fluctuated up and down;ID did not feel infectiois;Hem-Onc will w/u as outpt to r/o MPD and others  Acute gouty arthritis Dx by tap showing urate crystals;treated with naproxen and it improved  Acute on chronic diastolic heart failure Elevated BMP on admit, improved with Lasix  Transaminitis Fatty liver by [place];Hepatits panel neg, elevated CPK also;both improved with tx CHF and felt tobe hepatic congestion from that  Hypertension Some meds were rearranged in hospital and they will be continued as changed;Bp good  Type 2 diabetes mellitus with diabetic neuropathic arthropathy Well controlled on metformin;Hba1c was 6.5;pt on Neurontin for neuropathy  Physical deconditioning After lying in bed for 2 weeks, pt is weak; OT/PT    Hennie Duos, MD

## 2013-04-19 LAB — CULTURE, BLOOD (ROUTINE X 2): Culture: NO GROWTH

## 2013-04-23 ENCOUNTER — Other Ambulatory Visit: Payer: Self-pay | Admitting: *Deleted

## 2013-04-23 DIAGNOSIS — D72829 Elevated white blood cell count, unspecified: Secondary | ICD-10-CM

## 2013-04-24 ENCOUNTER — Other Ambulatory Visit: Payer: Self-pay | Admitting: Internal Medicine

## 2013-04-24 ENCOUNTER — Other Ambulatory Visit: Payer: Medicare Other | Admitting: Lab

## 2013-04-24 ENCOUNTER — Ambulatory Visit: Payer: Medicare Other | Admitting: Internal Medicine

## 2013-04-24 ENCOUNTER — Ambulatory Visit: Payer: Medicare Other

## 2013-04-24 DIAGNOSIS — D473 Essential (hemorrhagic) thrombocythemia: Secondary | ICD-10-CM

## 2013-04-24 DIAGNOSIS — D72829 Elevated white blood cell count, unspecified: Secondary | ICD-10-CM

## 2013-05-09 ENCOUNTER — Encounter: Payer: Self-pay | Admitting: Internal Medicine

## 2013-05-09 ENCOUNTER — Non-Acute Institutional Stay (SKILLED_NURSING_FACILITY): Payer: Medicare Other | Admitting: Internal Medicine

## 2013-05-09 DIAGNOSIS — M25569 Pain in unspecified knee: Secondary | ICD-10-CM

## 2013-05-09 DIAGNOSIS — E11610 Type 2 diabetes mellitus with diabetic neuropathic arthropathy: Secondary | ICD-10-CM

## 2013-05-09 DIAGNOSIS — D72829 Elevated white blood cell count, unspecified: Secondary | ICD-10-CM

## 2013-05-09 DIAGNOSIS — M109 Gout, unspecified: Secondary | ICD-10-CM

## 2013-05-09 DIAGNOSIS — E1149 Type 2 diabetes mellitus with other diabetic neurological complication: Secondary | ICD-10-CM

## 2013-05-09 DIAGNOSIS — I10 Essential (primary) hypertension: Secondary | ICD-10-CM

## 2013-05-09 DIAGNOSIS — I5032 Chronic diastolic (congestive) heart failure: Secondary | ICD-10-CM

## 2013-05-09 DIAGNOSIS — E876 Hypokalemia: Secondary | ICD-10-CM

## 2013-05-09 NOTE — Assessment & Plan Note (Signed)
Had in hospital- will recheck it to make sure it is not playing a factor in weakness

## 2013-05-09 NOTE — Assessment & Plan Note (Signed)
Will recheck CBC to see if WBC still elevated-if so will send to Hem-onc

## 2013-05-09 NOTE — Progress Notes (Signed)
MRN: [id number] Name: Sarah Colon  Sex: female Age: 67 y.o. DOB: 01-24-1946  [place] #: [facility] Facility/Room: 223 Level Of Care: SNF Provider: Inocencio Homes D Emergency Contacts: Extended Emergency Contact Information Primary Emergency Contact: [place] [employer] [place] Mobile Phone: [phone] Relation: Daughter  Code Status: FULL  Allergies: Codeine  Chief Complaint  Patient presents with  . Medical Managment of Chronic Issues    HPI: Patient is 67 y.o. female who is here for physical deconditioning, being seen for chronic problems.  Past Medical History  Diagnosis Date  . Arthritis   . Diabetes mellitus without complication   . Chronic pain of left knee   . Gout dx 04/09/2013    knee tapped by ortho with monosodium urate crystals  . Hypertension     Past Surgical History  Procedure Laterality Date  . Skin grafts        Medication List       This list is accurate as of: 05/09/13  3:41 PM.  Always use your most recent med list.               acetaminophen 325 MG tablet  Commonly known as:  TYLENOL  Take 2 tablets (650 mg total) by mouth every 6 (six) hours as needed for mild pain (or Fever >/= 101).     amLODipine 10 MG tablet  Commonly known as:  NORVASC  Take 1 tablet (10 mg total) by mouth daily.     benazepril 20 MG tablet  Commonly known as:  LOTENSIN  Take 1 tablet (20 mg total) by mouth daily.     bimatoprost 0.01 % Soln  Commonly known as:  LUMIGAN  Place 1 drop into both eyes at bedtime.     brimonidine 0.1 % Soln  Commonly known as:  ALPHAGAN P  Place 1 drop into both eyes 3 (three) times daily.     dorzolamide-timolol 22.3-6.8 MG/ML ophthalmic solution  Commonly known as:  COSOPT  Place 1 drop into both eyes 2 (two) times daily.     feeding supplement (GLUCERNA SHAKE) Liqd  Take 237 mLs by mouth 3 (three) times daily between meals.     gabapentin 600 MG tablet  Commonly known as:   NEURONTIN  Take 600 mg by mouth 2 (two) times daily.     hydrochlorothiazide 25 MG tablet  Commonly known as:  HYDRODIURIL  Take 1 tablet (25 mg total) by mouth daily.     meloxicam 15 MG tablet  Commonly known as:  MOBIC  Take 1 tablet (15 mg total) by mouth daily.     metFORMIN 500 MG 24 hr tablet  Commonly known as:  GLUCOPHAGE-XR  Take 1 tablet (500 mg total) by mouth 2 (two) times daily.     Oxycodone HCl 10 MG Tabs  Take 0.5 tablets (5 mg total) by mouth every 6 (six) hours as needed (Pain).     oxyCODONE 5 MG immediate release tablet  Commonly known as:  Oxy IR/ROXICODONE  Take one tablet by mouth every 6 hours as needed for pain     traMADol 50 MG tablet  Commonly known as:  ULTRAM  Take 1 tablet (50 mg total) by mouth 3 (three) times daily as needed (pain).        No orders of the defined types were placed in this encounter.     There is no immunization history on file for this patient.  History  Substance Use Topics  . Smoking status: Former Research scientist (life sciences)  . Smokeless tobacco: Not on file  . Alcohol Use: No    Review of Systems  DATA OBTAINED: from patient, nurse GENERAL: Feels well no fevers, fatigue, appetite changes SKIN: No itching, rash HEENT: No complaint RESPIRATORY: No cough, wheezing, SOB CARDIAC: No chest pain, palpitations, lower extremity edema  GI: No abdominal pain, No N/V/D or constipation, No heartburn or reflux  GU: No dysuria, frequency or urgency, or incontinence  MUSCULOSKELETAL: No unrelieved bone/joint pain; PT CONVEYS THAT PT'S KNEES GIVE OUT, NOT ALL DAYS BUT SOME DAYS;PT ADMITS SHE IS VERY VERY FEARFUL OF KNEES GIVING OUT AND FALLING; SHE SLID OFF BED LAST WEEK AND HAD TO CRAWL ON KNEES AND IT HAS BEEN WORSE SINCE THEN NEUROLOGIC: No headache, dizziness or focal weakness PSYCHIATRIC: No overt anxiety or sadness. Sleeps well.   Filed Vitals:   05/09/13 1458  BP: 167/86  Pulse: 90  Temp: 98.9 F (37.2 C)  Resp: 19    Physical  Exam  GENERAL APPEARANCE: Alert, conversant. Appropriately groomed. No acute distress  SKIN: No diaphoresis rash, or wounds HEENT: Unremarkable RESPIRATORY: Breathing is even, unlabored. Lung sounds are clear   CARDIOVASCULAR: Heart RRR no murmurs, rubs or gallops. No peripheral edema  GASTROINTESTINAL: Abdomen is soft, non-tender, not distended w/ normal bowel sounds.  GENITOURINARY: Bladder non tender, not distended  MUSCULOSKELETAL: No abnormal joints or musculature;JOINTS NOT RED,WARM OR CREPITANT NEUROLOGIC: Cranial nerves 2-12 grossly intact. Moves all extremities no tremor. PSYCHIATRIC: Mood and affect appropriate to situation, no behavioral issues  Patient Active Problem List   Diagnosis Date Noted  . Hypokalemia 05/09/2013  . Physical deconditioning 04/18/2013  . Chronic pain of left knee   . Gout   . Hypertension   . Unspecified constipation 04/14/2013  . Leukocytosis, unspecified 04/13/2013  . Acute on chronic diastolic heart failure [id number]  . Chronic diastolic heart failure 123456  . Fatty liver 04/11/2013  . Transaminitis 04/11/2013  . Acute gouty arthritis 04/10/2013  . 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 Knee pain 04/08/2013  . Sepsis 04/08/2013  . Septic arthritis of knee 04/08/2013    CBC    Component Value Date/Time   WBC 20.5* 04/17/2013 0718   RBC 3.43* 04/17/2013 0718   HGB 10.1* 04/17/2013 0718   HCT 29.2* 04/17/2013 0718   PLT 958* 04/17/2013 0718   MCV 85.1 04/17/2013 0718   LYMPHSABS 3.6 04/14/2013 0540   MONOABS 1.3* 04/14/2013 0540   EOSABS 0.2 04/14/2013 0540   BASOSABS 0.1 04/14/2013 0540    CMP     Component Value Date/Time   NA 143 04/16/2013 0605   K 4.4 04/16/2013 0605   CL 106 04/16/2013 0605   CO2 29 04/16/2013 0605   GLUCOSE 149* 04/16/2013 0605   BUN 36* 04/16/2013 0605   CREATININE 1.15* 04/16/2013 0605   CALCIUM 9.0 04/16/2013 0605   PROT 6.8 04/15/2013 1400   ALBUMIN  2.5* 04/15/2013 1400   AST 26 04/15/2013 1400   ALT 94* 04/15/2013 1400   ALKPHOS 177* 04/15/2013 1400   BILITOT 0.2* 04/15/2013 1400   GFRNONAA 47* 04/16/2013 0605   GFRAA 55* 04/16/2013 0605    Assessment and Plan  Knee pain Pt is not actually walking much because of her fear of her knees giving out-she has a dx of neuropathic arthropathy- so I think PT is the way to go; discussed getting braces for knees to give feeling of  support and increase confidence so she can walk more  Acute gouty arthritis Not active at this time  Transaminitis Gallstone and fatty liver; will recheck LFT's  Leukocytosis, unspecified Will recheck CBC to see if WBC still elevated-if so will send to Hem-onc  Hypertension Mildly elevated today but usually SBP 120-150 so no changes  Chronic diastolic heart failure Asymptomatic at this time  Type 2 diabetes mellitus with diabetic neuropathic arthropathy Stable, continue present regimen  Hypokalemia Had in hospital- will recheck it to make sure it is not playing a factor in weakness    Hennie Duos, MD

## 2013-05-09 NOTE — Assessment & Plan Note (Signed)
Gallstone and fatty liver; will recheck LFT's

## 2013-05-09 NOTE — Assessment & Plan Note (Signed)
Pt is not actually walking much because of her fear of her knees giving out-she has a dx of neuropathic arthropathy- so I think PT is the way to go; discussed getting braces for knees to give feeling of support and increase confidence so she can walk more

## 2013-05-09 NOTE — Assessment & Plan Note (Signed)
Mildly elevated today but usually SBP 120-150 so no changes

## 2013-05-09 NOTE — Assessment & Plan Note (Signed)
Stable, continue present regimen

## 2013-05-09 NOTE — Assessment & Plan Note (Signed)
Not active at this time 

## 2013-05-09 NOTE — Assessment & Plan Note (Signed)
Asymptomatic at this time 

## 2013-05-15 ENCOUNTER — Telehealth: Payer: Self-pay | Admitting: Internal Medicine

## 2013-05-15 NOTE — Telephone Encounter (Signed)
[facility] CALLED TO SCHEDULE NP APPT 12/17 @ 1:30 W/DR. MOHAMED DX-LEUKOCYTOSIS

## 2013-05-15 NOTE — Telephone Encounter (Signed)
C/D 05/15/13 for appt. 05/16/13

## 2013-05-16 ENCOUNTER — Encounter: Payer: Self-pay | Admitting: Internal Medicine

## 2013-05-16 ENCOUNTER — Ambulatory Visit (HOSPITAL_BASED_OUTPATIENT_CLINIC_OR_DEPARTMENT_OTHER): Payer: Medicare Other | Admitting: Internal Medicine

## 2013-05-16 ENCOUNTER — Other Ambulatory Visit: Payer: Self-pay | Admitting: Internal Medicine

## 2013-05-16 ENCOUNTER — Encounter: Payer: Self-pay | Admitting: *Deleted

## 2013-05-16 ENCOUNTER — Ambulatory Visit: Payer: Medicare Other

## 2013-05-16 ENCOUNTER — Other Ambulatory Visit (HOSPITAL_BASED_OUTPATIENT_CLINIC_OR_DEPARTMENT_OTHER): Payer: Medicare Other

## 2013-05-16 VITALS — BP 155/73 | HR 93 | Temp 98.9°F | Resp 18 | Ht 59.0 in | Wt 128.4 lb

## 2013-05-16 DIAGNOSIS — D72829 Elevated white blood cell count, unspecified: Secondary | ICD-10-CM

## 2013-05-16 DIAGNOSIS — D473 Essential (hemorrhagic) thrombocythemia: Secondary | ICD-10-CM

## 2013-05-16 LAB — COMPREHENSIVE METABOLIC PANEL (CC13)
ALT: 17 U/L (ref 0–55)
AST: 18 U/L (ref 5–34)
Albumin: 3.8 g/dL (ref 3.5–5.0)
Anion Gap: 12 mEq/L — ABNORMAL HIGH (ref 3–11)
CO2: 24 mEq/L (ref 22–29)
Calcium: 10.3 mg/dL (ref 8.4–10.4)
Chloride: 106 mEq/L (ref 98–109)
Creatinine: 1.2 mg/dL — ABNORMAL HIGH (ref 0.6–1.1)
Potassium: 3.9 mEq/L (ref 3.5–5.1)
Total Protein: 8.3 g/dL (ref 6.4–8.3)

## 2013-05-16 LAB — CBC WITH DIFFERENTIAL/PLATELET
BASO%: 0.3 % (ref 0.0–2.0)
Eosinophils Absolute: 0.4 10*3/uL (ref 0.0–0.5)
HCT: 35.8 % (ref 34.8–46.6)
LYMPH%: 17.1 % (ref 14.0–49.7)
MCHC: 33.8 g/dL (ref 31.5–36.0)
MONO#: 0.7 10*3/uL (ref 0.1–0.9)
MONO%: 4.9 % (ref 0.0–14.0)
NEUT#: 9.9 10*3/uL — ABNORMAL HIGH (ref 1.5–6.5)
Platelets: 375 10*3/uL (ref 145–400)
RBC: 4.18 10*6/uL (ref 3.70–5.45)
RDW: 15.7 % — ABNORMAL HIGH (ref 11.2–14.5)
WBC: 13.3 10*3/uL — ABNORMAL HIGH (ref 3.9–10.3)
lymph#: 2.3 10*3/uL (ref 0.9–3.3)
nRBC: 0 % (ref 0–0)

## 2013-05-16 NOTE — Progress Notes (Signed)
[facility] Telephone:([phone]) [phone]   Fax:(336) [phone]  CONSULT NOTE  REFERRING PHYSICIAN: Dr. Gevena Barre  REASON FOR CONSULTATION:  67 years old African American female with leukocytosis and thrombocytosis  HPI Sarah Colon is a 67 y.o. female with a past medical history significant for diabetes mellitus, hypertension  and gout. The patient was admitted to [facility] on 04/07/2013 complaining of increasing fatigue and weakness as well as pain in the left knee. She was diagnosed at that time was septic arthritis of the left knee as well as acute gout he arthritis. She was treated with a course of empiric IV vancomycin and cefepime. Blood culture at that time showed gram-positive and gram-negative coccobacilli. That was felt to be contaminant and the antibiotics were discontinued. During her evaluation, her CBC showed elevated white blood count up to 20.5 with elevated platelets count of 958,000. The patient was discharged to [facility] and rehabilitation facility. She was referred to me today for evaluation and recommendation regarding her history of leukocytosis and thrombocytosis. The patient is feeling much better today with no specific complaints except for mild fatigue.  Her arthritis has significantly improved. She has no chest pain, shortness of breath, cough or hemoptysis. She has no weight loss or night sweats. The patient denied having any bleeding issues. She denied having any fever or chills and no nausea or vomiting. Family history significant for hypertension and glaucoma. No family history of leukemia or blood disease. The patient is a widow and has one daughter. 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 She had remote history of smoking but quit 18 years ago. She has no history of alcohol and drug abuse. HPI  Past Medical History  Diagnosis Date  . Arthritis   . Diabetes mellitus without complication   .  Chronic pain of left knee   . Gout dx 04/09/2013    knee tapped by ortho with monosodium urate crystals  . Hypertension     Past Surgical History  Procedure Laterality Date  . Skin grafts      History reviewed. No pertinent family history.  Social History History  Substance Use Topics  . Smoking status: Former Smoker    Quit date: 05/31/1997  . Smokeless tobacco: Not on file  . Alcohol Use: No    Allergies  Allergen Reactions  . Codeine Other (See Comments)    Causes shaking    Current Outpatient Prescriptions  Medication Sig Dispense Refill  . acetaminophen (TYLENOL) 325 MG tablet Take 2 tablets (650 mg total) by mouth every 6 (six) hours as needed for mild pain (or Fever >/= 101).      Marland Kitchen amLODipine (NORVASC) 10 MG tablet Take 1 tablet (10 mg total) by mouth daily.  30 tablet  0  . benazepril (LOTENSIN) 20 MG tablet Take 1 tablet (20 mg total) by mouth daily.  30 tablet  0  . bimatoprost (LUMIGAN) 0.01 % SOLN Place 1 drop into both eyes at bedtime.      . brimonidine (ALPHAGAN P) 0.1 % SOLN Place 1 drop into both eyes 3 (three) times daily.      . dorzolamide-timolol (COSOPT) 22.3-6.8 MG/ML ophthalmic solution Place 1 drop into both eyes 2 (two) times daily.      . feeding supplement, GLUCERNA SHAKE, (GLUCERNA SHAKE) LIQD Take 237 mLs by mouth 3 (three) times daily between meals.    0  . gabapentin (NEURONTIN) 600 MG tablet  Take 600 mg by mouth 3 (three) times daily.       . hydrochlorothiazide (HYDRODIURIL) 25 MG tablet Take 1 tablet (25 mg total) by mouth daily.  30 tablet  0  . meloxicam (MOBIC) 15 MG tablet Take 1 tablet (15 mg total) by mouth daily.  30 tablet  0  . metFORMIN (GLUCOPHAGE-XR) 500 MG 24 hr tablet Take 1 tablet (500 mg total) by mouth 2 (two) times daily.  60 tablet  0  . oxyCODONE (OXY IR/ROXICODONE) 5 MG immediate release tablet Take one tablet by mouth every 6 hours as needed for pain  120 tablet  0  . traMADol (ULTRAM) 50 MG tablet Take 1 tablet (50  mg total) by mouth 3 (three) times daily as needed (pain).  90 tablet  5  . Oxycodone HCl 10 MG TABS Take 0.5 tablets (5 mg total) by mouth every 6 (six) hours as needed (Pain).  30 tablet  0   No current facility-administered medications for this visit.    Review of Systems  Constitutional: negative Eyes: negative Ears, nose, mouth, throat, and face: negative Respiratory: negative Cardiovascular: negative Gastrointestinal: negative Genitourinary:negative Integument/breast: negative Hematologic/lymphatic: negative Musculoskeletal:positive for muscle weakness Neurological: negative Behavioral/Psych: negative Endocrine: negative Allergic/Immunologic: negative  Physical Exam  [id number], healthy, no distress, well nourished and well developed SKIN: skin color, texture, turgor are normal, no rashes or significant lesions HEAD: Normocephalic, No masses, lesions, tenderness or abnormalities EYES: normal, PERRLA EARS: External ears normal, Canals clear OROPHARYNX:no exudate, no erythema and lips, buccal mucosa, and tongue normal  NECK: supple, no adenopathy LYMPH:  no palpable lymphadenopathy, no hepatosplenomegaly BREAST:not examined LUNGS: clear to auscultation , and palpation HEART: regular rate & rhythm and no murmurs ABDOMEN:abdomen soft, non-tender and normal bowel sounds BACK: Back symmetric, no curvature., No CVA tenderness EXTREMITIES:no edema, no skin discoloration, no clubbing  NEURO: alert & oriented x 3 with fluent speech, no focal motor/sensory deficits  PERFORMANCE STATUS: ECOG 1-2  LABORATORY DATA: Lab Results  Component Value Date   WBC 13.3* 05/16/2013   HGB 12.1 05/16/2013   HCT 35.8 05/16/2013   MCV 85.6 05/16/2013   PLT 266 05/16/2013      Chemistry      Component Value Date/Time   NA 142 05/16/2013 1346   NA 143 04/16/2013 0605   K 3.9 05/16/2013 1346   K 4.4 04/16/2013 0605   CL 106 04/16/2013 0605   CO2 24 05/16/2013 1346   CO2 29  04/16/2013 0605   BUN 29.9* 05/16/2013 1346   BUN 36* 04/16/2013 0605   CREATININE 1.2* 05/16/2013 1346   CREATININE 1.15* 04/16/2013 0605      Component Value Date/Time   CALCIUM 10.3 05/16/2013 1346   CALCIUM 9.0 04/16/2013 0605   ALKPHOS 80 05/16/2013 1346   ALKPHOS 177* 04/15/2013 1400   AST 18 05/16/2013 1346   AST 26 04/15/2013 1400   ALT 17 05/16/2013 1346   ALT 94* 04/15/2013 1400   BILITOT 0.28 05/16/2013 1346   BILITOT 0.2* 04/15/2013 1400       RADIOGRAPHIC STUDIES: Dg Abd 1 View  04/16/2013   CLINICAL DATA:  Leukocytosis, possible ileus  EXAM: ABDOMEN - 1 VIEW  COMPARISON:  04/14/2013  FINDINGS: Small bowel decompressed. Moderate fecal material throughout the colon and distending the rectum as before. Probable calcified left uterine fibroid. Regional bones unremarkable. Bilateral pelvic phleboliths.  IMPRESSION: Nonobstructive bowel gas pattern with moderate colonic and rectal fecal material.   Electronically Signed   By:  Arne Cleveland M.D.   On: 04/16/2013 18:31    ASSESSMENT: This is a very pleasant 67 years old [place] American female with history of leukocytosis and thrombocytosis during her hospitalization which most likely was reactive in nature secondary to septic and acute gouty arthritis. Her platelets count has completely normalized. Her leukocyte count is also improving.    PLAN: I had a lengthy discussion with the patient today about her condition. I explained to the patient that her condition was leukocytosis and thrombocytosis was most likely reactive in nature secondary to infection which is significantly improved at this point. I also explained to the patient back I will order bone marrow studies to rule out any myeloproliferative disorder and this including molecular study for BCR/ABL and JAK-2 mutation. I recommended for the patient to continue her routine followup visit with her primary care physician as scheduled. If there is any significant  abnormalities in the molecular studies, I will call the patient with the recommendation and ask her to come back for followup visit with me. Otherwise you can continue on observation with followup on as-needed basis. I gave the patient the time to ask questions and I answered them completely to her satisfaction. She was advised to call if she has any concerning symptoms  The patient voices understanding of current disease status and treatment options and is in agreement with the current care plan.  All questions were answered. The patient knows to call the clinic with any problems, questions or concerns. We can certainly see the patient much sooner if necessary.  Thank you so much for allowing me to participate in the care of Sarah Colon. I will continue to follow up the patient with you and assist in her care.  I spent 35 minutes counseling the patient face to face. The total time spent in the appointment was 50 minutes.  Cassandra Mcmanaman K. 05/16/2013, 2:51 PM

## 2013-05-16 NOTE — Patient Instructions (Signed)
Followup visits with your primary care physician as previously scheduled.  Followup visit with me on as-needed basis. Condition it is most likely reactive in nature but I will call you there is any abnormality in the molecular studies.

## 2013-05-16 NOTE — Progress Notes (Signed)
Checked in new pt with no financial concerns. °

## 2013-05-22 ENCOUNTER — Non-Acute Institutional Stay (SKILLED_NURSING_FACILITY): Payer: Medicare Other | Admitting: Nurse Practitioner

## 2013-05-22 DIAGNOSIS — R609 Edema, unspecified: Secondary | ICD-10-CM

## 2013-05-22 NOTE — Progress Notes (Signed)
Patient ID: Sarah Colon, female   DOB: 01-29-1946, 67 y.o.   MRN: [id number]    Nursing Home Location:  [place] of Service: SNF (64)  PCP: Salena Saner., MD  Allergies  Allergen Reactions  . Codeine Other (See Comments)    Causes shaking    Chief Complaint  Patient presents with  . Acute Visit    HPI:  67 year old female being seen at the request of nursing for lower extremity edema Pt with pmh of dm, chf, arthritis, constpation, htn. deies any chest pain or shortness of breath Reports ongoing edema in feet into her ankles bilaterally, no pain, redness or heat, no fevers or chils  Review of Systems:  Review of Systems  Constitutional: Negative for fever and chills.  Respiratory: Negative for cough and shortness of breath.   Cardiovascular: Positive for leg swelling. Negative for chest pain.  Gastrointestinal: Negative for constipation.  Neurological: Negative for weakness.     Past Medical History  Diagnosis Date  . Arthritis   . Diabetes mellitus without complication   . Chronic pain of left knee   . Gout dx 04/09/2013    knee tapped by ortho with monosodium urate crystals  . Hypertension    Past Surgical History  Procedure Laterality Date  . Skin grafts     Social History:   reports that she quit smoking about 15 years ago. She does not have any smokeless tobacco history on file. She reports that she does not drink alcohol or use illicit drugs.  No family history on file.  Medications: Patient's Medications  New Prescriptions   No medications on file  Previous Medications   ACETAMINOPHEN (TYLENOL) 325 MG TABLET    Take 2 tablets (650 mg total) by mouth every 6 (six) hours as needed for mild pain (or Fever >/= 101).   AMLODIPINE (NORVASC) 10 MG TABLET    Take 1 tablet (10 mg total) by mouth daily.   BENAZEPRIL (LOTENSIN) 20 MG TABLET    Take 1 tablet (20 mg total) by mouth daily.   BIMATOPROST (LUMIGAN) 0.01 % SOLN     Place 1 drop into both eyes at bedtime.   BRIMONIDINE (ALPHAGAN P) 0.1 % SOLN    Place 1 drop into both eyes 3 (three) times daily.   DORZOLAMIDE-TIMOLOL (COSOPT) 22.3-6.8 MG/ML OPHTHALMIC SOLUTION    Place 1 drop into both eyes 2 (two) times daily.   FEEDING SUPPLEMENT, GLUCERNA SHAKE, (GLUCERNA SHAKE) LIQD    Take 237 mLs by mouth 3 (three) times daily between meals.   GABAPENTIN (NEURONTIN) 600 MG TABLET    Take 600 mg by mouth 3 (three) times daily.    HYDROCHLOROTHIAZIDE (HYDRODIURIL) 25 MG TABLET    Take 1 tablet (25 mg total) by mouth daily.   MELOXICAM (MOBIC) 15 MG TABLET    Take 1 tablet (15 mg total) by mouth daily.   METFORMIN (GLUCOPHAGE-XR) 500 MG 24 HR TABLET    Take 1 tablet (500 mg total) by mouth 2 (two) times daily.   OXYCODONE (OXY IR/ROXICODONE) 5 MG IMMEDIATE RELEASE TABLET    Take one tablet by mouth every 6 hours as needed for pain   OXYCODONE HCL 10 MG TABS    Take 0.5 tablets (5 mg total) by mouth every 6 (six) hours as needed (Pain).   TRAMADOL (ULTRAM) 50 MG TABLET    Take 1 tablet (50 mg total) by mouth 3 (three) times daily as needed (  pain).  Modified Medications   No medications on file  Discontinued Medications   No medications on file     Physical Exam: Physical Exam  Constitutional: She is well-developed, well-nourished, and in no distress.  Cardiovascular: Normal rate, regular rhythm, normal heart sounds and intact distal pulses.   Pulmonary/Chest: Effort normal and breath sounds normal.  Abdominal: Soft. Bowel sounds are normal. She exhibits no distension.  Musculoskeletal: She exhibits edema (in bilateral feet +1).  Neurological: She is alert.  Skin: Skin is warm and dry.  Psychiatric: Affect normal.    Filed Vitals:   05/22/13 1449  BP: 109/79  Pulse: 68  Temp: 98.2 F (36.8 C)  Resp: 20      Labs reviewed: Basic Metabolic Panel:  Recent Labs  04/14/13 0540 04/15/13 1400 04/16/13 0605 05/16/13 1346  NA 143 142 143 142  K 3.9 4.3  4.4 3.9  CL 103 105 106  --   CO2 30 27 29 24   GLUCOSE 179* 187* 149* 155*  BUN 35* 32* 36* 29.9*  CREATININE 1.41* 1.42* 1.15* 1.2*  CALCIUM 9.3 8.9 9.0 10.3   Liver Function Tests:  Recent Labs  04/11/13 1300 04/15/13 1400 05/16/13 1346  AST 260* 26 18  ALT 319* 94* 17  ALKPHOS 273* 177* 80  BILITOT 0.3 0.2* 0.28  PROT 6.9 6.8 8.3  ALBUMIN 2.4* 2.5* 3.8   No results found for this basename: LIPASE, AMYLASE,  in the last 8760 hours No results found for this basename: AMMONIA,  in the last 8760 hours CBC:  Recent Labs  04/07/13 2006  04/14/13 0540  04/16/13 0605 04/17/13 0718 05/16/13 1346  WBC 20.2*  < > 17.6*  < > 19.9* 20.5* 13.3*  NEUTROABS 16.4*  --  12.5*  --   --   --  9.9*  HGB 12.7  < > 11.1*  < > 9.9* 10.1* 12.1  HCT 34.6*  < > 31.7*  < > 28.6* 29.2* 35.8  MCV 81.6  < > 83.6  < > 85.4 85.1 85.6  PLT 539*  < > 814*  < > 891* 958* 375 large and giant platelets, few clumps  < > = values in this interval not displayed. Cardiac Enzymes:  Recent Labs  04/08/13 0230 04/09/13 0610  CKTOTAL 1567* 440*   BNP: No components found with this basename: POCBNP,  CBG:  Recent Labs  04/17/13 0857 04/17/13 1151 04/17/13 1729  GLUCAP 260* 131* 156*   TSH:  Recent Labs  04/08/13 0230  TSH 1.669   A1C: Lab Results  Component Value Date   HGBA1C 6.5* 04/12/2013    Assessment/Plan 1. Edema -minimal swelling to bilateral feet to ankles -will have staff apply TED hose q am and take off at bedtime -no added salt- pt reports she is already doing this  -elevated LE when sitting -will cont to monitor

## 2013-06-05 ENCOUNTER — Encounter: Payer: Self-pay | Admitting: Nurse Practitioner

## 2013-06-05 ENCOUNTER — Non-Acute Institutional Stay (SKILLED_NURSING_FACILITY): Payer: Medicare Other | Admitting: Nurse Practitioner

## 2013-06-05 DIAGNOSIS — R7402 Elevation of levels of lactic acid dehydrogenase (LDH): Secondary | ICD-10-CM

## 2013-06-05 DIAGNOSIS — I5032 Chronic diastolic (congestive) heart failure: Secondary | ICD-10-CM

## 2013-06-05 DIAGNOSIS — R740 Nonspecific elevation of levels of transaminase and lactic acid dehydrogenase [LDH]: Secondary | ICD-10-CM

## 2013-06-05 DIAGNOSIS — D72829 Elevated white blood cell count, unspecified: Secondary | ICD-10-CM

## 2013-06-05 DIAGNOSIS — M25562 Pain in left knee: Principal | ICD-10-CM

## 2013-06-05 DIAGNOSIS — I10 Essential (primary) hypertension: Secondary | ICD-10-CM

## 2013-06-05 DIAGNOSIS — G8929 Other chronic pain: Secondary | ICD-10-CM

## 2013-06-05 DIAGNOSIS — K5900 Constipation, unspecified: Secondary | ICD-10-CM

## 2013-06-05 DIAGNOSIS — M25569 Pain in unspecified knee: Secondary | ICD-10-CM

## 2013-06-05 DIAGNOSIS — G4700 Insomnia, unspecified: Secondary | ICD-10-CM

## 2013-06-05 DIAGNOSIS — R7401 Elevation of levels of liver transaminase levels: Secondary | ICD-10-CM

## 2013-06-05 NOTE — Progress Notes (Signed)
Patient ID: Sarah Colon, female   DOB: July 29, 1945, 68 y.o.   MRN: [id number]    Nursing Home Location:  [place] of Service: SNF (64)  PCP: Salena Saner., MD  Allergies  Allergen Reactions  . Codeine Other (See Comments)    Causes shaking    Chief Complaint  Patient presents with  . Medical Managment of Chronic Issues    HPI:  67 year old female with pmh of dm, chf, arthritis, constpation, htn who is being seen today for a routine follow up on chronic conditions; pt denies any chest pain or shortness of breath, worsening edema, N/V/D or constipation,  no fevers or chills; pt [facility] at [facility] for short term rehab after hospitalization for septic arthritis and deconditioning   Review of Systems:  Review of Systems  Constitutional: Negative for fever and chills.  HENT: Negative for congestion and sore throat.   Respiratory: Negative for cough and shortness of breath.   Cardiovascular: Negative for chest pain and leg swelling.  Gastrointestinal: Negative for heartburn, abdominal pain, diarrhea and constipation.  Genitourinary: Negative for dysuria, urgency and frequency.  Musculoskeletal: Positive for joint pain (knee). Negative for back pain and myalgias.  Skin: Negative.   Neurological: Negative for dizziness, weakness and headaches.  Psychiatric/Behavioral: Negative for depression. The patient has insomnia. The patient is not nervous/anxious.      Past Medical History  Diagnosis Date  . Arthritis   . Diabetes mellitus without complication   . Chronic pain of left knee   . Gout dx 04/09/2013    knee tapped by ortho with monosodium urate crystals  . Hypertension    Past Surgical History  Procedure Laterality Date  . Skin grafts     Social History:   reports that she quit smoking about 16 years ago. She does not have any smokeless tobacco history on file. She reports that she does not drink alcohol or use illicit drugs.  No family  history on file.  Medications: Patient's Medications  New Prescriptions   No medications on file  Previous Medications   ACETAMINOPHEN (TYLENOL) 325 MG TABLET    Take 2 tablets (650 mg total) by mouth every 6 (six) hours as needed for mild pain (or Fever >/= 101).   AMLODIPINE (NORVASC) 10 MG TABLET    Take 1 tablet (10 mg total) by mouth daily.   BENAZEPRIL (LOTENSIN) 20 MG TABLET    Take 1 tablet (20 mg total) by mouth daily.   BIMATOPROST (LUMIGAN) 0.01 % SOLN    Place 1 drop into both eyes at bedtime.   BRIMONIDINE (ALPHAGAN P) 0.1 % SOLN    Place 1 drop into both eyes 3 (three) times daily.   DORZOLAMIDE-TIMOLOL (COSOPT) 22.3-6.8 MG/ML OPHTHALMIC SOLUTION    Place 1 drop into both eyes 2 (two) times daily.   FEEDING SUPPLEMENT, GLUCERNA SHAKE, (GLUCERNA SHAKE) LIQD    Take 237 mLs by mouth 3 (three) times daily between meals.   GABAPENTIN (NEURONTIN) 600 MG TABLET    Take 600 mg by mouth 3 (three) times daily.    HYDROCHLOROTHIAZIDE (HYDRODIURIL) 25 MG TABLET    Take 1 tablet (25 mg total) by mouth daily.   MELOXICAM (MOBIC) 15 MG TABLET    Take 1 tablet (15 mg total) by mouth daily.   METFORMIN (GLUCOPHAGE-XR) 500 MG 24 HR TABLET    Take 1 tablet (500 mg total) by mouth 2 (two) times daily.   OXYCODONE (OXY IR/ROXICODONE)  5 MG IMMEDIATE RELEASE TABLET    Take one tablet by mouth every 6 hours as needed for pain   OXYCODONE HCL 10 MG TABS    Take 0.5 tablets (5 mg total) by mouth every 6 (six) hours as needed (Pain).   TRAMADOL (ULTRAM) 50 MG TABLET    Take 1 tablet (50 mg total) by mouth 3 (three) times daily as needed (pain).  Modified Medications   No medications on file  Discontinued Medications   No medications on file     Physical Exam: Physical Exam  Constitutional: She is well-developed, well-nourished, and in no distress.  HENT:  Mouth/Throat: Oropharynx is clear and moist. No oropharyngeal exudate.  Eyes: Conjunctivae and EOM are normal. Pupils are equal, round, and  reactive to light.  Neck: Normal range of motion. Neck supple.  Cardiovascular: Normal rate, regular rhythm, normal heart sounds and intact distal pulses.   Pulmonary/Chest: Effort normal and breath sounds normal.  Abdominal: Soft. Bowel sounds are normal. She exhibits no distension.  Musculoskeletal: She exhibits edema (in bilateral feet +1 does not extend into legs) and tenderness (to knees).  Neurological: She is alert.  Skin: Skin is warm and dry. She is not diaphoretic.  Psychiatric: Affect normal.    Filed Vitals:   06/05/13 1442  BP: 150/78  Pulse: 88  Temp: 99 F (37.2 C)  Resp: 20      Labs reviewed: Basic Metabolic Panel:  Recent Labs  04/14/13 0540 04/15/13 1400 04/16/13 0605 05/16/13 1346  NA 143 142 143 142  K 3.9 4.3 4.4 3.9  CL 103 105 106  --   CO2 _0 GLUCOSE 179* 187* 149* 155*  BUN 35* 32* 36* 29.9*  CREATININE 1.41* 1.42* 1.15* 1.2*  CALCIUM 9.3 8.9 9.0 10.3   Liver Function Tests:  Recent Labs  04/11/13 1300 04/15/13 1400 05/16/13 1346  AST 260* 26 18  ALT 319* 94* 17  ALKPHOS 273* 177* 80  BILITOT 0.3 0.2* 0.28  PROT 6.9 6.8 8.3  ALBUMIN 2.4* 2.5* 3.8   No results found for this basename: LIPASE, AMYLASE,  in the last 8760 hours No results found for this basename: AMMONIA,  in the last 8760 hours CBC:  Recent Labs  04/07/13 2006  04/14/13 0540  04/16/13 0605 04/17/13 0718 05/16/13 1346  WBC 20.2*  < > 17.6*  < > 19.9* 20.5* 13.3*  NEUTROABS 16.4*  --  12.5*  --   --   --  9.9*  HGB 12.7  < > 11.1*  < > 9.9* 10.1* 12.1  HCT 34.6*  < > 31.7*  < > 28.6* 29.2* 35.8  MCV 81.6  < > 83.6  < > 85.4 85.1 85.6  PLT 539*  < > 814*  < > 891* 958* 375 large and giant platelets, few clumps  < > = values in this interval not displayed. Cardiac Enzymes:  Recent Labs  04/08/13 0230 04/09/13 0610  CKTOTAL 1567* 440*   BNP: No components found with this basename: POCBNP,  CBG:  Recent Labs  04/17/13 0857 04/17/13 1151  04/17/13 1729  GLUCAP 260* 131* 156*   TSH:  Recent Labs  04/08/13 0230  TSH 1.669   A1C: Lab Results  Component Value Date   HGBA1C 6.5* 04/12/2013   Hemoglobin A1C    Result: 05/08/2013 6:27 PM   ( Status: F )     C Hemoglobin A1C 6.2   H <5.7 % SLN C Estimated Average  Glucose 131   H <117 mg/dL SLN   CBC with Diff    Result: 05/10/2013 12:27 PM   ( Status: F )     C WBC 14.4   H 4.0-10.5 K/uL SLN   RBC 3.55   L 4.22-5.81 MIL/uL SLN   Hemoglobin 10.3   L 13.0-17.0 g/dL SLN   Hematocrit 31.2   L 39.0-52.0 % SLN   MCV 87.9     78.0-100.0 fL SLN   MCH 29.0     26.0-34.0 pg SLN   MCHC 33.0     30.0-36.0 g/dL SLN   RDW 16.0   H 11.5-15.5 % SLN   Platelet Count 338     150-400 K/uL SLN   Granulocyte % 64     43-77 % SLN   Absolute Gran 9.3   H 1.7-7.7 K/uL SLN   Lymph % 27     12-46 % SLN   Absolute Lymph 3.8     0.7-4.0 K/uL SLN   Mono % 6     3-12 % SLN   Absolute Mono 0.9     0.1-1.0 K/uL SLN   Eos % 3     0-5 % SLN   Absolute Eos 0.4     0.0-0.7 K/uL SLN   Baso % 0     0-1 % SLN   Absolute Baso 0.1     0.0-0.1 K/uL SLN   Smear Review Criteria for review not met  SLN   Comprehensive Metabolic Panel    Result: 05/10/2013 12:36 PM   ( Status: F )       Sodium 142     135-145 mEq/L SLN   Potassium 3.9     3.5-5.3 mEq/L SLN   Chloride 107     96-112 mEq/L SLN   CO2 23     19-32 mEq/L SLN   Glucose 113   H 70-99 mg/dL SLN   BUN 34   H 6-23 mg/dL SLN   Creatinine 1.34     0.50-1.35 mg/dL SLN   Bilirubin, Total 0.2   L 0.3-1.2 mg/dL SLN   Alkaline Phosphatase 58     39-117 U/L SLN   AST/SGOT 15     0-37 U/L SLN   ALT/SGPT 12     0-53 U/L SLN   Total Protein 6.3     6.0-8.3 g/dL SLN   Albumin 3.3   L 3.5-5.2 g/dL SLN   Calcium 9.0     8.4-10.5 mg/dL SLN   Magnesium    Result: 05/10/2013 12:36 PM   ( Status: F )       Magnesium 1.5     1.5-2.5 mg/dL SLN   Vitamin D (25-Hydroxy)    Result: 05/11/2013 1:52 AM   ( Status: F )       Vitamin D (25-Hydroxy) 46      30-89 ng/mL   Assessment/Plan 1. Chronic diastolic heart failure -stable at this time  2. Hypertension -Patient is stable; continue current regimen. Will monitor and make changes as necessary.  3. Transaminitis -with fatty liver and gallstones, repeat LFT are improved  4. Leukocytosis, unspecified -unchanged; following with hematology at this time  5. Chronic pain of left knee -will have staff apply biofreeze BID and once daily as needed in addition to scheduled   6. Unspecified constipation -without complaints at this time  7. Insomnia -will have staff decrease interruptions to pt to allow for better sleep

## 2013-06-22 ENCOUNTER — Other Ambulatory Visit: Payer: Self-pay | Admitting: *Deleted

## 2013-06-22 MED ORDER — OXYCODONE HCL 5 MG PO TABS: ORAL_TABLET | ORAL | Status: DC

## 2013-06-25 ENCOUNTER — Encounter: Payer: Self-pay | Admitting: Internal Medicine

## 2013-06-25 ENCOUNTER — Non-Acute Institutional Stay (SKILLED_NURSING_FACILITY): Payer: Medicare Other | Admitting: Internal Medicine

## 2013-06-25 DIAGNOSIS — H109 Unspecified conjunctivitis: Secondary | ICD-10-CM

## 2013-06-25 NOTE — Progress Notes (Signed)
MRN: [id number] Name: Sarah Colon  Sex: female Age: 68 y.o. DOB: [birth year]  [place] #: [facility] Facility/Room: 223 Level Of Care: SNF Provider: Inocencio Homes D Emergency Contacts: Extended Emergency Contact Information Primary Emergency Contact: [place] Mobile Phone: [phone] Relation: Daughter   Allergies: Codeine  Chief Complaint  Patient presents with  . Acute Visit    HPI: Patient is 68 y.o. female who today c/o that her eye feels like there is "trash" in it, it hurts and she admits that there was sticky d/c in it this am. No fever, ST, colds or coughs.  Past Medical History  Diagnosis Date  . Arthritis   . Diabetes mellitus without complication   . Chronic pain of left knee   . Gout dx 04/09/2013    knee tapped by ortho with monosodium urate crystals  . Hypertension     Past Surgical History  Procedure Laterality Date  . Skin grafts        Medication List       This list is accurate as of: 06/25/13  7:48 PM.  Always use your most recent med list.               acetaminophen 325 MG tablet  Commonly known as:  TYLENOL  Take 2 tablets (650 mg total) by mouth every 6 (six) hours as needed for mild pain (or Fever >/= 101).     amLODipine 10 MG tablet  Commonly known as:  NORVASC  Take 1 tablet (10 mg total) by mouth daily.     benazepril 20 MG tablet  Commonly known as:  LOTENSIN  Take 1 tablet (20 mg total) by mouth daily.     bimatoprost 0.01 % Soln  Commonly known as:  LUMIGAN  Place 1 drop into both eyes at bedtime.     brimonidine 0.1 % Soln  Commonly known as:  ALPHAGAN P  Place 1 drop into both eyes 3 (three) times daily.     dorzolamide-timolol 22.3-6.8 MG/ML ophthalmic solution  Commonly known as:  COSOPT  Place 1 drop into both eyes 2 (two) times daily.     feeding supplement (GLUCERNA SHAKE) Liqd  Take 237 mLs by mouth 3 (three) times daily between meals.     gabapentin  600 MG tablet  Commonly known as:  NEURONTIN  Take 600 mg by mouth 3 (three) times daily.     hydrochlorothiazide 25 MG tablet  Commonly known as:  HYDRODIURIL  Take 1 tablet (25 mg total) by mouth daily.     meloxicam 15 MG tablet  Commonly known as:  MOBIC  Take 1 tablet (15 mg total) by mouth daily.     metFORMIN 500 MG 24 hr tablet  Commonly known as:  GLUCOPHAGE-XR  Take 1 tablet (500 mg total) by mouth 2 (two) times daily.     Oxycodone HCl 10 MG Tabs  Take 0.5 tablets (5 mg total) by mouth every 6 (six) hours as needed (Pain).     oxyCODONE 5 MG immediate release tablet  Commonly known as:  Oxy IR/ROXICODONE  Take one tablet by mouth every 6 hours as needed for pain     traMADol 50 MG tablet  Commonly known as:  ULTRAM  Take 1 tablet (50 mg total) by mouth 3 (three) times daily as needed (pain).        No orders of the defined types were placed in  this encounter.     There is no immunization history on file for this patient.  History  Substance Use Topics  . Smoking status: Former Smoker    Quit date: 05/31/1997  . Smokeless tobacco: Not on file  . Alcohol Use: No    Review of Systems  DATA OBTAINED: from patient; as per HPI GENERAL: Feels well no fevers, fatigue, appetite changes SKIN: No itching, rash HEENT: as per HPI RESPIRATORY: No cough, wheezing, SOB CARDIAC: No chest pain, palpitations, lower extremity edema  GI: No abdominal pain, No N/V/D or constipation, No heartburn or reflux  GU: No dysuria, frequency or urgency, or incontinence  MUSCULOSKELETAL: No unrelieved bone/joint pain NEUROLOGIC: No headache, dizziness or focal weakness PSYCHIATRIC: No overt anxiety or sadness. Sleeps well.   Filed Vitals:   06/25/13 1944  BP: 152/78  Pulse: 93  Temp: 98.6 F (37 C)  Resp: 20    Physical Exam  GENERAL APPEARANCE: Alert, conversant. Appropriately groomed. No acute distress  SKIN: No diaphoresis rash, or wounds HEENT:L eye -conjunctiva  mildly injected and some slightly cloudy d/c noted RESPIRATORY: Breathing is even, unlabored. Lung sounds are clear   CARDIOVASCULAR: Heart RRR no murmurs, rubs or gallops. No peripheral edema  GASTROINTESTINAL: Abdomen is soft, non-tender, not distended w/ normal bowel sounds.  GENITOURINARY: Bladder non tender, not distended  MUSCULOSKELETAL: No abnormal joints or musculature NEUROLOGIC: Cranial nerves 2-12 grossly intact. Moves all extremities no tremor. PSYCHIATRIC: Mood and affect appropriate to situation, no behavioral issues  Patient Active Problem List   Diagnosis Date Noted  . Hypokalemia 05/09/2013  . Physical deconditioning 04/18/2013  . Chronic pain of left knee   . Gout   . Hypertension   . Unspecified constipation 04/14/2013  . Leukocytosis, unspecified 04/13/2013  . Acute on chronic diastolic heart failure [id number]  . Chronic diastolic heart failure [id number]  . Fatty liver 04/11/2013  . Transaminitis 04/11/2013  . Acute gouty arthritis 04/10/2013  . 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 Knee pain 04/08/2013  . Sepsis 04/08/2013  . Septic arthritis of knee 04/08/2013    CBC    Component Value Date/Time   WBC 13.3* 05/16/2013 1346   WBC 20.5* 04/17/2013 0718   RBC 4.18 05/16/2013 1346   RBC 3.43* 04/17/2013 0718   HGB 12.1 05/16/2013 1346   HGB 10.1* 04/17/2013 0718   HCT 35.8 05/16/2013 1346   HCT 29.2* 04/17/2013 0718   PLT 375 large and giant platelets, few clumps 05/16/2013 1346   PLT 958* 04/17/2013 0718   MCV 85.6 05/16/2013 1346   MCV 85.1 04/17/2013 0718   LYMPHSABS 2.3 05/16/2013 1346   LYMPHSABS 3.6 04/14/2013 0540   MONOABS 0.7 05/16/2013 1346   MONOABS 1.3* 04/14/2013 0540   EOSABS 0.4 05/16/2013 1346   EOSABS 0.2 04/14/2013 0540   BASOSABS 0.0 05/16/2013 1346   BASOSABS 0.1 04/14/2013 0540    CMP     Component Value Date/Time   NA 142 05/16/2013 1346   NA 143 04/16/2013 0605   K 3.9  05/16/2013 1346   K 4.4 04/16/2013 0605   CL 106 04/16/2013 0605   CO2 24 05/16/2013 1346   CO2 29 04/16/2013 0605   GLUCOSE 155* 05/16/2013 1346   GLUCOSE 149* 04/16/2013 0605   BUN 29.9* 05/16/2013 1346   BUN 36* 04/16/2013 0605   CREATININE 1.2* 05/16/2013 1346   CREATININE 1.15* 04/16/2013 0605   CALCIUM 10.3 05/16/2013 1346   CALCIUM 9.0 04/16/2013 [id number]  PROT 8.3 05/16/2013 1346   PROT 6.8 04/15/2013 1400   ALBUMIN 3.8 05/16/2013 1346   ALBUMIN 2.5* 04/15/2013 1400   AST 18 05/16/2013 1346   AST 26 04/15/2013 1400   ALT 17 05/16/2013 1346   ALT 94* 04/15/2013 1400   ALKPHOS 80 05/16/2013 1346   ALKPHOS 177* 04/15/2013 1400   BILITOT 0.28 05/16/2013 1346   BILITOT 0.2* 04/15/2013 1400   GFRNONAA 47* 04/16/2013 0605   GFRAA 55* 04/16/2013 0605    Assessment and Plan  CONJUNCTIVITIS-  L eye - garamycin eye drops q2 h today then QID for a total 7 days   Hennie Duos, MD

## 2013-07-03 ENCOUNTER — Non-Acute Institutional Stay (SKILLED_NURSING_FACILITY): Payer: Medicare Other | Admitting: Nurse Practitioner

## 2013-07-03 DIAGNOSIS — M25562 Pain in left knee: Principal | ICD-10-CM

## 2013-07-03 DIAGNOSIS — G8929 Other chronic pain: Secondary | ICD-10-CM

## 2013-07-03 DIAGNOSIS — E11610 Type 2 diabetes mellitus with diabetic neuropathic arthropathy: Secondary | ICD-10-CM

## 2013-07-03 DIAGNOSIS — R5381 Other malaise: Secondary | ICD-10-CM

## 2013-07-03 DIAGNOSIS — M25569 Pain in unspecified knee: Secondary | ICD-10-CM

## 2013-07-03 DIAGNOSIS — K5900 Constipation, unspecified: Secondary | ICD-10-CM

## 2013-07-03 DIAGNOSIS — E1149 Type 2 diabetes mellitus with other diabetic neurological complication: Secondary | ICD-10-CM

## 2013-07-03 DIAGNOSIS — I5032 Chronic diastolic (congestive) heart failure: Secondary | ICD-10-CM

## 2013-07-03 DIAGNOSIS — I10 Essential (primary) hypertension: Secondary | ICD-10-CM

## 2013-07-03 DIAGNOSIS — R609 Edema, unspecified: Secondary | ICD-10-CM

## 2013-07-03 NOTE — Progress Notes (Signed)
Patient ID: Sarah Colon, female   DOB: [birth year], 68 y.o.   MRN: [id number]    Nursing Home Location:  [place] of Service: SNF (20)  PCP: Salena Saner., MD  Allergies  Allergen Reactions  . Codeine Other (See Comments)    Causes shaking    Chief Complaint  Patient presents with  . Medical Managment of Chronic Issues    HPI:  68 year old female with pmh of dm, chf, arthritis, constpation, htn who is being seen today for a routine follow up on chronic conditions; pt denies any chest pain or shortness of breath, worsening edema, N/V/D or constipation, no fevers or chills; pt here at [facility] for short term rehab after hospitalization for septic arthritis and deconditioning; pt wants to know what medication she is getting that is causing her to urinate so frequently and wants this adjusted; no dysuria or abdominal discomfort noted; otherwise she is doing well; was seen last week for conjunctivitis and this has improved; does not like biofreeze gives her a headache; feels like pain is well controlled with PRN oxy  Review of Systems:  Review of Systems  Constitutional: Negative for fever and chills.  HENT: Negative for congestion and sore throat.   Eyes: Negative for blurred vision, pain, discharge and redness.  Respiratory: Negative for cough and shortness of breath.   Cardiovascular: Negative for chest pain and leg swelling.  Gastrointestinal: Negative for heartburn, abdominal pain, diarrhea and constipation.  Genitourinary: Negative for dysuria, urgency and frequency.  Musculoskeletal: Positive for joint pain (knee). Negative for back pain and myalgias.  Skin: Negative.   Neurological: Negative for dizziness, weakness and headaches.  Psychiatric/Behavioral: Negative for depression. The patient is not nervous/anxious.      Past Medical History  Diagnosis Date  . Arthritis   . Diabetes mellitus without complication   . Chronic pain of left  knee   . Gout dx 04/09/2013    knee tapped by ortho with monosodium urate crystals  . Hypertension    Past Surgical History  Procedure Laterality Date  . Skin grafts     Social History:   reports that she quit smoking about 16 years ago. She does not have any smokeless tobacco history on file. She reports that she does not drink alcohol or use illicit drugs.  No family history on file.  Medications: Patient's Medications  New Prescriptions   No medications on file  Previous Medications   ACETAMINOPHEN (TYLENOL) 325 MG TABLET    Take 2 tablets (650 mg total) by mouth every 6 (six) hours as needed for mild pain (or Fever >/= 101).   AMLODIPINE (NORVASC) 10 MG TABLET    Take 1 tablet (10 mg total) by mouth daily.   BENAZEPRIL (LOTENSIN) 20 MG TABLET    Take 1 tablet (20 mg total) by mouth daily.   BIMATOPROST (LUMIGAN) 0.01 % SOLN    Place 1 drop into both eyes at bedtime.   BRIMONIDINE (ALPHAGAN P) 0.1 % SOLN    Place 1 drop into both eyes 3 (three) times daily.   DORZOLAMIDE-TIMOLOL (COSOPT) 22.3-6.8 MG/ML OPHTHALMIC SOLUTION    Place 1 drop into both eyes 2 (two) times daily.   FEEDING SUPPLEMENT, GLUCERNA SHAKE, (GLUCERNA SHAKE) LIQD    Take 237 mLs by mouth 3 (three) times daily between meals.   GABAPENTIN (NEURONTIN) 600 MG TABLET    Take 600 mg by mouth 3 (three) times daily.    HYDROCHLOROTHIAZIDE (HYDRODIURIL)  25 MG TABLET    Take 1 tablet (25 mg total) by mouth daily.   MELOXICAM (MOBIC) 15 MG TABLET    Take 1 tablet (15 mg total) by mouth daily.   METFORMIN (GLUCOPHAGE-XR) 500 MG 24 HR TABLET    Take 1 tablet (500 mg total) by mouth 2 (two) times daily.   OXYCODONE (OXY IR/ROXICODONE) 5 MG IMMEDIATE RELEASE TABLET    Take one tablet by mouth every 6 hours as needed for pain   OXYCODONE HCL 10 MG TABS    Take 0.5 tablets (5 mg total) by mouth every 6 (six) hours as needed (Pain).   TRAMADOL (ULTRAM) 50 MG TABLET    Take 1 tablet (50 mg total) by mouth 3 (three) times daily as  needed (pain).  Modified Medications   No medications on file  Discontinued Medications   No medications on file     Physical Exam:  Filed Vitals:   07/03/13 1416  BP: 143/81  Pulse: 80  Temp: 98.4 F (36.9 C)  Resp: 20    Physical Exam  Constitutional: She is well-developed, well-nourished, and in no distress.  HENT:  Mouth/Throat: Oropharynx is clear and moist. No oropharyngeal exudate.  Eyes: Conjunctivae and EOM are normal. Pupils are equal, round, and reactive to light.  Neck: Normal range of motion. Neck supple.  Cardiovascular: Normal rate, regular rhythm, normal heart sounds and intact distal pulses.   Pulmonary/Chest: Effort normal and breath sounds normal.  Abdominal: Soft. Bowel sounds are normal. She exhibits no distension.  Musculoskeletal: She exhibits edema (in bilateral feet +1 does not extend into legs) and tenderness (to knees).  Neurological: She is alert.  Skin: Skin is warm and dry. She is not diaphoretic.  Psychiatric: Affect normal.     Labs reviewed: Basic Metabolic Panel:  Recent Labs  04/14/13 0540 04/15/13 1400 04/16/13 0605 05/16/13 1346  NA 143 142 143 142  K 3.9 4.3 4.4 3.9  CL 103 105 106  --   CO2 '30 27 29 24  ' GLUCOSE 179* 187* 149* 155*  BUN 35* 32* 36* 29.9*  CREATININE 1.41* 1.42* 1.15* 1.2*  CALCIUM 9.3 8.9 9.0 10.3   Liver Function Tests:  Recent Labs  04/11/13 1300 04/15/13 1400 05/16/13 1346  AST 260* 26 18  ALT 319* 94* 17  ALKPHOS 273* 177* 80  BILITOT 0.3 0.2* 0.28  PROT 6.9 6.8 8.3  ALBUMIN 2.4* 2.5* 3.8   No results found for this basename: LIPASE, AMYLASE,  in the last 8760 hours No results found for this basename: AMMONIA,  in the last 8760 hours CBC:  Recent Labs  04/07/13 2006  04/14/13 0540  04/16/13 0605 04/17/13 0718 05/16/13 1346  WBC 20.2*  < > 17.6*  < > 19.9* 20.5* 13.3*  NEUTROABS 16.4*  --  12.5*  --   --   --  9.9*  HGB 12.7  < > 11.1*  < > 9.9* 10.1* 12.1  HCT 34.6*  < > 31.7*   < > 28.6* 29.2* 35.8  MCV 81.6  < > 83.6  < > 85.4 85.1 85.6  PLT 539*  < > 814*  < > 891* 958* 375 large and giant platelets, few clumps  < > = values in this interval not displayed. Cardiac Enzymes:  Recent Labs  04/08/13 0230 04/09/13 0610  CKTOTAL 1567* 440*   BNP: No components found with this basename: POCBNP,  CBG:  Recent Labs  04/17/13 0857 04/17/13 1151 04/17/13 1729  GLUCAP  260* 131* 156*   TSH:  Recent Labs  04/08/13 0230  TSH 1.669   A1C: Lab Results  Component Value Date   HGBA1C 6.5* 04/12/2013   Hemoglobin A1C    Result: 05/08/2013 6:27 PM   ( Status: F )     C Hemoglobin A1C 6.2   H <5.7 % SLN C Estimated Average Glucose 131   H CBC with Diff    Result: 05/10/2013 12:27 PM   ( Status: F )     C WBC 14.4   H 4.0-10.5 K/uL SLN   RBC 3.55   L 4.22-5.81 MIL/uL SLN   Hemoglobin 10.3   L 13.0-17.0 g/dL SLN   Hematocrit 31.2   L 39.0-52.0 % SLN   MCV 87.9     78.0-100.0 fL SLN   MCH 29.0     26.0-34.0 pg SLN   MCHC 33.0     30.0-36.0 g/dL SLN   RDW 16.0   H 11.5-15.5 % SLN   Platelet Count 338     150-400 K/uL SLN   Granulocyte % 64     43-77 % SLN   Absolute Gran 9.3   H 1.7-7.7 K/uL SLN   Lymph % 27     12-46 % SLN   Absolute Lymph 3.8     0.7-4.0 K/uL SLN   Mono % 6     3-12 % SLN   Absolute Mono 0.9     0.1-1.0 K/uL SLN   Eos % 3     0-5 % SLN   Absolute Eos 0.4     0.0-0.7 K/uL SLN   Baso % 0     0-1 % SLN   Absolute Baso 0.1     0.0-0.1 K/uL SLN   Smear Review Criteria for review not met  SLN   Comprehensive Metabolic Panel    Result: 05/10/2013 12:36 PM   ( Status: F )       Sodium 142     135-145 mEq/L SLN   Potassium 3.9     3.5-5.3 mEq/L SLN   Chloride 107     96-112 mEq/L SLN   CO2 23     19-32 mEq/L SLN   Glucose 113   H 70-99 mg/dL SLN   BUN 34   H 6-23 mg/dL SLN   Creatinine 1.34     0.50-1.35 mg/dL SLN   Bilirubin, Total 0.2   L 0.3-1.2 mg/dL SLN   Alkaline Phosphatase 58     39-117 U/L SLN   AST/SGOT 15     0-37 U/L SLN    ALT/SGPT 12     0-53 U/L SLN   Total Protein 6.3     6.0-8.3 g/dL SLN   Albumin 3.3   L 3.5-5.2 g/dL SLN   Calcium 9.0     8.4-10.5 mg/dL SLN   Magnesium    Result: 05/10/2013 12:36 PM   ( Status: F )       Magnesium 1.5     1.5-2.5 mg/dL SLN   Vitamin D (25-Hydroxy)    Result: 05/11/2013 1:52 AM   ( Status: F )       Vitamin D (25-Hydroxy) 46     30-89 ng/mL  Assessment/Plan 1. Chronic diastolic heart failure -stable no worsening of symptoms  2. Unspecified constipation -stable on current regimen  3. Type 2 diabetes mellitus with diabetic neuropathic arthropathy -A1c is at goal; will cont current medications  4. Chronic pain of left knee -will change  biofreeze to PRN -will decrease mobic to 7.55m for dose reduction  -cont PRN tramadol and oxy  5. Physical deconditioning -conts to work with therapy  6. Hypertension -with decrease of HCTZ to help with freq urination; will also decrease norvasc to 5 mg daily to prevent worsening edema -will increase lotensin to 40 mg daily  -will follow up BMP in 1 week   7. Edema -stable at this time; remains in feet onl

## 2013-07-10 ENCOUNTER — Encounter: Payer: Self-pay | Admitting: Nurse Practitioner

## 2013-07-10 ENCOUNTER — Non-Acute Institutional Stay (SKILLED_NURSING_FACILITY): Payer: Medicare Other | Admitting: Nurse Practitioner

## 2013-07-10 DIAGNOSIS — E1149 Type 2 diabetes mellitus with other diabetic neurological complication: Secondary | ICD-10-CM

## 2013-07-10 DIAGNOSIS — E11610 Type 2 diabetes mellitus with diabetic neuropathic arthropathy: Secondary | ICD-10-CM

## 2013-07-10 DIAGNOSIS — I5032 Chronic diastolic (congestive) heart failure: Secondary | ICD-10-CM

## 2013-07-10 DIAGNOSIS — M25569 Pain in unspecified knee: Secondary | ICD-10-CM

## 2013-07-10 DIAGNOSIS — M109 Gout, unspecified: Secondary | ICD-10-CM

## 2013-07-10 DIAGNOSIS — D72829 Elevated white blood cell count, unspecified: Secondary | ICD-10-CM

## 2013-07-10 DIAGNOSIS — G8929 Other chronic pain: Secondary | ICD-10-CM

## 2013-07-10 DIAGNOSIS — M25562 Pain in left knee: Secondary | ICD-10-CM

## 2013-07-10 DIAGNOSIS — K5900 Constipation, unspecified: Secondary | ICD-10-CM

## 2013-07-10 DIAGNOSIS — I10 Essential (primary) hypertension: Secondary | ICD-10-CM

## 2013-07-10 DIAGNOSIS — R5381 Other malaise: Secondary | ICD-10-CM

## 2013-07-10 NOTE — Progress Notes (Signed)
Patient ID: Sarah Colon, female   DOB: 05-20-1946, 68 y.o.   MRN: [id number]    Nursing Home Location:  [place] of Service: SNF (26)  PCP: Salena Saner., MD  Allergies  Allergen Reactions  . Codeine Other (See Comments)    Causes shaking    Chief Complaint  Patient presents with  . Medical Managment of Chronic Issues    HPI:  68 year old female with pmh of dm, chf, arthritis, constipation , htn who is being seen today for discharge home; pt denies any chest pain or shortness of breath, worsening edema, N/V/D or constipation, no fevers or chills; pt here at [facility] for short term rehab after hospitalization for septic arthritis and deconditioning. Patient currently doing well with therapy, now stable to discharge home with home health.  Review of Systems:  Review of Systems  Constitutional: Negative for fever, chills and malaise/fatigue.  Respiratory: Negative for cough and shortness of breath.   Cardiovascular: Negative for chest pain, palpitations and leg swelling.  Gastrointestinal: Negative for heartburn, abdominal pain, diarrhea and constipation.  Genitourinary: Positive for frequency. Negative for dysuria, urgency, hematuria and flank pain.  Musculoskeletal: Positive for joint pain (knees). Negative for back pain.  Skin: Negative for itching and rash.  Neurological: Negative for dizziness, weakness and headaches.     Past Medical History  Diagnosis Date  . Arthritis   . Diabetes mellitus without complication   . Chronic pain of left knee   . Gout dx 04/09/2013    knee tapped by ortho with monosodium urate crystals  . Hypertension    Past Surgical History  Procedure Laterality Date  . Skin grafts     Social History:   reports that she quit smoking about 16 years ago. She does not have any smokeless tobacco history on file. She reports that she does not drink alcohol or use illicit drugs.  No family history on  file.  Medications: Patient's Medications  New Prescriptions   No medications on file  Previous Medications   ACETAMINOPHEN (TYLENOL) 325 MG TABLET    Take 2 tablets (650 mg total) by mouth every 6 (six) hours as needed for mild pain (or Fever >/= 101).   BENAZEPRIL (LOTENSIN) 40 MG TABLET    Take 40 mg by mouth daily.   BIMATOPROST (LUMIGAN) 0.01 % SOLN    Place 1 drop into both eyes at bedtime.   BRIMONIDINE (ALPHAGAN P) 0.1 % SOLN    Place 1 drop into both eyes 3 (three) times daily.   DORZOLAMIDE-TIMOLOL (COSOPT) 22.3-6.8 MG/ML OPHTHALMIC SOLUTION    Place 1 drop into both eyes 2 (two) times daily.   FEEDING SUPPLEMENT, GLUCERNA SHAKE, (GLUCERNA SHAKE) LIQD    Take 237 mLs by mouth 3 (three) times daily between meals.   GABAPENTIN (NEURONTIN) 600 MG TABLET    Take 600 mg by mouth 3 (three) times daily.    HYDROCHLOROTHIAZIDE (MICROZIDE) 12.5 MG CAPSULE    Take 12.5 mg by mouth daily.   MELOXICAM (MOBIC) 7.5 MG TABLET    Take 7.5 mg by mouth daily.   METFORMIN (GLUCOPHAGE-XR) 500 MG 24 HR TABLET    Take 1 tablet (500 mg total) by mouth 2 (two) times daily.   OXYCODONE (OXY IR/ROXICODONE) 5 MG IMMEDIATE RELEASE TABLET    Take one tablet by mouth every 6 hours as needed for pain   TRAMADOL (ULTRAM) 50 MG TABLET    Take 1 tablet (50 mg total)  by mouth 3 (three) times daily as needed (pain).  Modified Medications   Modified Medication Previous Medication   AMLODIPINE (NORVASC) 10 MG TABLET amLODipine (NORVASC) 10 MG tablet      Take 5 mg by mouth daily.    Take 1 tablet (10 mg total) by mouth daily.  Discontinued Medications   BENAZEPRIL (LOTENSIN) 20 MG TABLET    Take 1 tablet (20 mg total) by mouth daily.   HYDROCHLOROTHIAZIDE (HYDRODIURIL) 25 MG TABLET    Take 1 tablet (25 mg total) by mouth daily.   MELOXICAM (MOBIC) 15 MG TABLET    Take 1 tablet (15 mg total) by mouth daily.   OXYCODONE HCL 10 MG TABS    Take 0.5 tablets (5 mg total) by mouth every 6 (six) hours as needed (Pain).      Physical Exam:  Filed Vitals:   07/10/13 1058  BP: 146/81  Pulse: 68  Temp: 97 F (36.1 C)  Resp: 20    Physical Exam  Constitutional: She is well-developed, well-nourished, and in no distress.  HENT:  Head: Normocephalic and atraumatic.  Mouth/Throat: Oropharynx is clear and moist. No oropharyngeal exudate.  Eyes: Conjunctivae and EOM are normal. Pupils are equal, round, and reactive to light.  Neck: Normal range of motion. Neck supple.  Cardiovascular: Normal rate, regular rhythm, normal heart sounds and intact distal pulses.   Pulmonary/Chest: Effort normal and breath sounds normal.  Abdominal: Soft. Bowel sounds are normal. She exhibits no distension. There is no tenderness.  Musculoskeletal: She exhibits tenderness (to knees). She exhibits no edema.  Neurological: She is alert.  Skin: Skin is warm and dry. She is not diaphoretic.  Psychiatric: Affect normal.     Labs reviewed: Basic Metabolic Panel:  Recent Labs  04/14/13 0540 04/15/13 1400 04/16/13 0605 05/16/13 1346  NA 143 142 143 142  K 3.9 4.3 4.4 3.9  CL 103 105 106  --   CO2 30 27 29 24   GLUCOSE 179* 187* 149* 155*  BUN 35* 32* 36* 29.9*  CREATININE 1.41* 1.42* 1.15* 1.2*  CALCIUM 9.3 8.9 9.0 10.3   Liver Function Tests:  Recent Labs  04/11/13 1300 04/15/13 1400 05/16/13 1346  AST 260* 26 18  ALT 319* 94* 17  ALKPHOS 273* 177* 80  BILITOT 0.3 0.2* 0.28  PROT 6.9 6.8 8.3  ALBUMIN 2.4* 2.5* 3.8   No results found for this basename: LIPASE, AMYLASE,  in the last 8760 hours No results found for this basename: AMMONIA,  in the last 8760 hours CBC:  Recent Labs  04/07/13 2006  04/14/13 0540  04/16/13 0605 04/17/13 0718 05/16/13 1346  WBC 20.2*  < > 17.6*  < > 19.9* 20.5* 13.3*  NEUTROABS 16.4*  --  12.5*  --   --   --  9.9*  HGB 12.7  < > 11.1*  < > 9.9* 10.1* 12.1  HCT 34.6*  < > 31.7*  < > 28.6* 29.2* 35.8  MCV 81.6  < > 83.6  < > 85.4 85.1 85.6  PLT 539*  < > 814*  < > 891*  958* 375 large and giant platelets, few clumps  < > = values in this interval not displayed. Cardiac Enzymes:  Recent Labs  04/08/13 0230 04/09/13 0610  CKTOTAL 1567* 440*   BNP: No components found with this basename: POCBNP,  CBG:  Recent Labs  04/17/13 0857 04/17/13 1151 04/17/13 1729  GLUCAP 260* 131* 156*   TSH:  Recent Labs  04/08/13  0230  TSH 1.669   A1C: Lab Results  Component Value Date   HGBA1C 6.5* 04/12/2013    Assessment/Plan 1. Unspecified constipation -stable   2. Hypertension -stable; norvasc decrease to 5 mg last week, hctz decrease, and benazepril increase; pt tolerating well; blood pressures have been at goal  3. Chronic diastolic heart failure -stable; HCTZ decrease but no signs of fluid retention or worsening CHF   4. Chronic pain of left knee -biofreeze did not help; hsa PRN oxycodone and ultram  -reduced mobic pain is unchanged  -conts on gabapentin  5. Gout -without acute symptoms -diet modifications encouraged   6. Leukocytosis, unspecified -was seen by hemo onc; plts and wbcs were thought to be reactive and normalizing from hospital; will need follow up from PCP on cbc   7. Type 2 diabetes mellitus with diabetic neuropathic arthropathy -conts on metformin twice daily  8. Physical deconditioning -pt is stable for discharge-will need PT/OT/Nursing per home health. No DME needed. Rx written.  will need to follow up with PCP within 2 weeks.

## 2013-08-14 ENCOUNTER — Inpatient Hospital Stay (HOSPITAL_COMMUNITY)
Admission: EM | Admit: 2013-08-14 | Discharge: 2013-08-18 | DRG: 554 | Disposition: A | Discharge status: Home Health | Payer: Medicare Other | Attending: Internal Medicine | Admitting: Internal Medicine

## 2013-08-14 DIAGNOSIS — M109 Gout, unspecified: Principal | ICD-10-CM | POA: Diagnosis present

## 2013-08-14 DIAGNOSIS — M009 Pyogenic arthritis, unspecified: Secondary | ICD-10-CM | POA: Diagnosis present

## 2013-08-14 DIAGNOSIS — D72829 Elevated white blood cell count, unspecified: Secondary | ICD-10-CM | POA: Diagnosis present

## 2013-08-14 DIAGNOSIS — I471 Supraventricular tachycardia, unspecified: Secondary | ICD-10-CM

## 2013-08-14 DIAGNOSIS — R740 Nonspecific elevation of levels of transaminase and lactic acid dehydrogenase [LDH]: Secondary | ICD-10-CM

## 2013-08-14 DIAGNOSIS — N390 Urinary tract infection, site not specified: Secondary | ICD-10-CM

## 2013-08-14 DIAGNOSIS — M25562 Pain in left knee: Secondary | ICD-10-CM

## 2013-08-14 DIAGNOSIS — E1142 Type 2 diabetes mellitus with diabetic polyneuropathy: Secondary | ICD-10-CM | POA: Diagnosis present

## 2013-08-14 DIAGNOSIS — I5033 Acute on chronic diastolic (congestive) heart failure: Secondary | ICD-10-CM

## 2013-08-14 DIAGNOSIS — R5381 Other malaise: Secondary | ICD-10-CM

## 2013-08-14 DIAGNOSIS — E1149 Type 2 diabetes mellitus with other diabetic neurological complication: Secondary | ICD-10-CM | POA: Diagnosis present

## 2013-08-14 DIAGNOSIS — G8929 Other chronic pain: Secondary | ICD-10-CM

## 2013-08-14 DIAGNOSIS — R509 Fever, unspecified: Secondary | ICD-10-CM

## 2013-08-14 DIAGNOSIS — I10 Essential (primary) hypertension: Secondary | ICD-10-CM | POA: Diagnosis present

## 2013-08-14 DIAGNOSIS — K760 Fatty (change of) liver, not elsewhere classified: Secondary | ICD-10-CM

## 2013-08-14 DIAGNOSIS — I498 Other specified cardiac arrhythmias: Secondary | ICD-10-CM | POA: Diagnosis present

## 2013-08-14 DIAGNOSIS — M25569 Pain in unspecified knee: Secondary | ICD-10-CM

## 2013-08-14 DIAGNOSIS — A419 Sepsis, unspecified organism: Secondary | ICD-10-CM

## 2013-08-14 DIAGNOSIS — N179 Acute kidney failure, unspecified: Secondary | ICD-10-CM | POA: Diagnosis present

## 2013-08-14 DIAGNOSIS — E876 Hypokalemia: Secondary | ICD-10-CM

## 2013-08-14 DIAGNOSIS — Z79899 Other long term (current) drug therapy: Secondary | ICD-10-CM

## 2013-08-14 DIAGNOSIS — I509 Heart failure, unspecified: Secondary | ICD-10-CM | POA: Diagnosis present

## 2013-08-14 DIAGNOSIS — E11610 Type 2 diabetes mellitus with diabetic neuropathic arthropathy: Secondary | ICD-10-CM | POA: Diagnosis present

## 2013-08-14 DIAGNOSIS — R7401 Elevation of levels of liver transaminase levels: Secondary | ICD-10-CM

## 2013-08-14 DIAGNOSIS — Z87891 Personal history of nicotine dependence: Secondary | ICD-10-CM

## 2013-08-14 DIAGNOSIS — K5900 Constipation, unspecified: Secondary | ICD-10-CM

## 2013-08-14 DIAGNOSIS — I5032 Chronic diastolic (congestive) heart failure: Secondary | ICD-10-CM

## 2013-08-15 ENCOUNTER — Inpatient Hospital Stay (HOSPITAL_COMMUNITY): Payer: Medicare Other

## 2013-08-15 ENCOUNTER — Encounter (HOSPITAL_COMMUNITY): Payer: Self-pay | Admitting: Emergency Medicine

## 2013-08-15 DIAGNOSIS — R509 Fever, unspecified: Secondary | ICD-10-CM

## 2013-08-15 DIAGNOSIS — I10 Essential (primary) hypertension: Secondary | ICD-10-CM

## 2013-08-15 DIAGNOSIS — M109 Gout, unspecified: Secondary | ICD-10-CM

## 2013-08-15 DIAGNOSIS — M009 Pyogenic arthritis, unspecified: Secondary | ICD-10-CM

## 2013-08-15 DIAGNOSIS — I471 Supraventricular tachycardia: Secondary | ICD-10-CM

## 2013-08-15 HISTORY — PX: KNEE ASPIRATION: SHX1892

## 2013-08-15 LAB — SYNOVIAL CELL COUNT + DIFF, W/ CRYSTALS
Crystals, Fluid: NONE SEEN
Eosinophils-Synovial: 0 % (ref 0–1)
Eosinophils-Synovial: 0 % (ref 0–1)
LYMPHOCYTES-SYNOVIAL FLD: 1 % (ref 0–20)
Lymphocytes-Synovial Fld: 1 % (ref 0–20)
MONOCYTE-MACROPHAGE-SYNOVIAL FLUID: 7 % — AB (ref 50–90)
Monocyte-Macrophage-Synovial Fluid: 4 % — ABNORMAL LOW (ref 50–90)
Neutrophil, Synovial: 95 % — ABNORMAL HIGH (ref 0–25)
Neutrophil, Synovial: 92 % — ABNORMAL HIGH (ref 0–25)
OTHER CELLS-SYN: 0
WBC, Synovial: 38300 /mm3 — ABNORMAL HIGH (ref 0–200)
WBC, Synovial: 19504 /mm3 — ABNORMAL HIGH (ref 0–200)

## 2013-08-15 LAB — CBC WITH DIFFERENTIAL/PLATELET
Basophils Absolute: 0 10*3/uL (ref 0.0–0.1)
Basophils Relative: 0 % (ref 0–1)
Eosinophils Absolute: 0 10*3/uL (ref 0.0–0.7)
Eosinophils Relative: 0 % (ref 0–5)
HCT: 30.9 % — ABNORMAL LOW (ref 36.0–46.0)
Hemoglobin: 11.1 g/dL — ABNORMAL LOW (ref 12.0–15.0)
Lymphocytes Relative: 7 % — ABNORMAL LOW (ref 12–46)
Lymphs Abs: 1.8 10*3/uL (ref 0.7–4.0)
MCH: 29.4 pg (ref 26.0–34.0)
MCHC: 35.9 g/dL (ref 30.0–36.0)
MCV: 81.7 fL (ref 78.0–100.0)
MONOS PCT: 10 % (ref 3–12)
Monocytes Absolute: 2.5 10*3/uL — ABNORMAL HIGH (ref 0.1–1.0)
Neutro Abs: 21.1 10*3/uL — ABNORMAL HIGH (ref 1.7–7.7)
Neutrophils Relative %: 83 % — ABNORMAL HIGH (ref 43–77)
Platelets: 430 10*3/uL — ABNORMAL HIGH (ref 150–400)
RBC: 3.78 MIL/uL — ABNORMAL LOW (ref 3.87–5.11)
RDW: 14.2 % (ref 11.5–15.5)
WBC: 25.4 10*3/uL — AB (ref 4.0–10.5)

## 2013-08-15 LAB — GLUCOSE, CAPILLARY
GLUCOSE-CAPILLARY: 217 mg/dL — AB (ref 70–99)
Glucose-Capillary: 130 mg/dL — ABNORMAL HIGH (ref 70–99)
Glucose-Capillary: 140 mg/dL — ABNORMAL HIGH (ref 70–99)
Glucose-Capillary: 391 mg/dL — ABNORMAL HIGH (ref 70–99)

## 2013-08-15 LAB — URINALYSIS, ROUTINE W REFLEX MICROSCOPIC
Bilirubin Urine: NEGATIVE
Glucose, UA: NEGATIVE mg/dL
KETONES UR: 15 mg/dL — AB
NITRITE: POSITIVE — AB
PH: 5.5 (ref 5.0–8.0)
Protein, ur: 100 mg/dL — AB
Specific Gravity, Urine: 1.016 (ref 1.005–1.030)
UROBILINOGEN UA: 1 mg/dL (ref 0.0–1.0)

## 2013-08-15 LAB — GRAM STAIN: GRAM STAIN: NONE SEEN

## 2013-08-15 LAB — HEMOGLOBIN A1C
Hgb A1c MFr Bld: 7 % — ABNORMAL HIGH (ref <5.7)
Mean Plasma Glucose: 154 mg/dL — ABNORMAL HIGH (ref <117)

## 2013-08-15 LAB — URINE MICROSCOPIC-ADD ON

## 2013-08-15 LAB — BASIC METABOLIC PANEL
BUN: 20 mg/dL (ref 6–23)
CHLORIDE: 97 meq/L (ref 96–112)
CO2: 23 mEq/L (ref 19–32)
CREATININE: 1.18 mg/dL — AB (ref 0.50–1.10)
Calcium: 9.4 mg/dL (ref 8.4–10.5)
GFR calc non Af Amer: 46 mL/min — ABNORMAL LOW (ref >90)
GFR, EST AFRICAN AMERICAN: 53 mL/min — AB (ref >90)
Glucose, Bld: 170 mg/dL — ABNORMAL HIGH (ref 70–99)
Potassium: 3.9 mEq/L (ref 3.7–5.3)
Sodium: 139 mEq/L (ref 137–147)

## 2013-08-15 LAB — SEDIMENTATION RATE: SED RATE: 90 mm/h — AB (ref 0–22)

## 2013-08-15 LAB — URIC ACID: Uric Acid, Serum: 9.9 mg/dL — ABNORMAL HIGH (ref 2.4–7.0)

## 2013-08-15 LAB — GLUCOSE, SEROUS FLUID: Glucose, Fluid: 77 mg/dL

## 2013-08-15 LAB — CBG MONITORING, ED: GLUCOSE-CAPILLARY: 150 mg/dL — AB (ref 70–99)

## 2013-08-15 LAB — I-STAT CG4 LACTIC ACID, ED: Lactic Acid, Venous: 1.91 mmol/L (ref 0.5–2.2)

## 2013-08-15 LAB — PROTEIN, BODY FLUID: Total protein, fluid: 5.7 g/dL

## 2013-08-15 LAB — I-STAT TROPONIN, ED: Troponin i, poc: 0.01 ng/mL (ref 0.00–0.08)

## 2013-08-15 LAB — TSH: TSH: 0.52 u[IU]/mL (ref 0.350–4.500)

## 2013-08-15 MED ORDER — ONDANSETRON HCL 4 MG PO TABS: 4.0000 mg | ORAL_TABLET | Freq: Four times a day (QID) | ORAL | Status: DC | PRN

## 2013-08-15 MED ORDER — COLCHICINE 0.6 MG PO TABS
0.6000 mg | ORAL_TABLET | Freq: Every day | ORAL | Status: DC
  Administered 2013-08-15: 0.6 mg via ORAL
  Filled 2013-08-15 (×3): qty 1

## 2013-08-15 MED ORDER — NAPROXEN 500 MG PO TABS
500.0000 mg | ORAL_TABLET | Freq: Two times a day (BID) | ORAL | Status: DC
  Administered 2013-08-15 – 2013-08-17 (×4): 500 mg via ORAL
  Filled 2013-08-15 (×6): qty 1

## 2013-08-15 MED ORDER — INSULIN ASPART 100 UNIT/ML ~~LOC~~ SOLN
0.0000 [IU] | Freq: Every day | SUBCUTANEOUS | Status: DC
  Administered 2013-08-15: 2 [IU] via SUBCUTANEOUS

## 2013-08-15 MED ORDER — DORZOLAMIDE HCL-TIMOLOL MAL 2-0.5 % OP SOLN
1.0000 [drp] | Freq: Two times a day (BID) | OPHTHALMIC | Status: DC
  Administered 2013-08-15 – 2013-08-17 (×5): 1 [drp] via OPHTHALMIC
  Filled 2013-08-15: qty 10

## 2013-08-15 MED ORDER — OXYCODONE HCL 5 MG PO TABS
5.0000 mg | ORAL_TABLET | Freq: Four times a day (QID) | ORAL | Status: DC | PRN
  Administered 2013-08-18: 5 mg via ORAL
  Filled 2013-08-15: qty 1

## 2013-08-15 MED ORDER — LIDOCAINE HCL (PF) 1 % IJ SOLN
5.0000 mL | Freq: Once | INTRAMUSCULAR | Status: AC
  Administered 2013-08-15: 5 mL
  Filled 2013-08-15: qty 5

## 2013-08-15 MED ORDER — ENOXAPARIN SODIUM 40 MG/0.4ML ~~LOC~~ SOLN
40.0000 mg | SUBCUTANEOUS | Status: DC
  Administered 2013-08-15 – 2013-08-16 (×2): 40 mg via SUBCUTANEOUS
  Filled 2013-08-15 (×2): qty 0.4

## 2013-08-15 MED ORDER — BUPIVACAINE HCL (PF) 0.5 % IJ SOLN
10.0000 mL | Freq: Once | INTRAMUSCULAR | Status: DC
  Filled 2013-08-15: qty 10

## 2013-08-15 MED ORDER — INSULIN GLARGINE 100 UNIT/ML ~~LOC~~ SOLN
5.0000 [IU] | Freq: Every day | SUBCUTANEOUS | Status: DC
  Administered 2013-08-15 – 2013-08-16 (×2): 5 [IU] via SUBCUTANEOUS
  Filled 2013-08-15 (×3): qty 0.05

## 2013-08-15 MED ORDER — INSULIN ASPART 100 UNIT/ML ~~LOC~~ SOLN
0.0000 [IU] | Freq: Three times a day (TID) | SUBCUTANEOUS | Status: DC
  Administered 2013-08-15: 15 [IU] via SUBCUTANEOUS
  Administered 2013-08-16: 3 [IU] via SUBCUTANEOUS

## 2013-08-15 MED ORDER — LATANOPROST 0.005 % OP SOLN
1.0000 [drp] | Freq: Every day | OPHTHALMIC | Status: DC
  Administered 2013-08-15 – 2013-08-17 (×3): 1 [drp] via OPHTHALMIC
  Filled 2013-08-15: qty 2.5

## 2013-08-15 MED ORDER — LIDOCAINE-EPINEPHRINE 1 %-1:100000 IJ SOLN
10.0000 mL | Freq: Once | INTRAMUSCULAR | Status: AC
  Administered 2013-08-15: 10 mL
  Filled 2013-08-15: qty 1

## 2013-08-15 MED ORDER — BRIMONIDINE TARTRATE 0.2 % OP SOLN
1.0000 [drp] | Freq: Three times a day (TID) | OPHTHALMIC | Status: DC
  Administered 2013-08-15 – 2013-08-17 (×7): 1 [drp] via OPHTHALMIC
  Filled 2013-08-15: qty 5

## 2013-08-15 MED ORDER — ACETAMINOPHEN 325 MG PO TABS: 650.0000 mg | ORAL_TABLET | Freq: Four times a day (QID) | ORAL | Status: DC | PRN

## 2013-08-15 MED ORDER — METFORMIN HCL ER 500 MG PO TB24
500.0000 mg | ORAL_TABLET | Freq: Two times a day (BID) | ORAL | Status: DC
  Administered 2013-08-15: 500 mg via ORAL
  Filled 2013-08-15 (×2): qty 1

## 2013-08-15 MED ORDER — BRIMONIDINE TARTRATE 0.15 % OP SOLN
1.0000 [drp] | Freq: Three times a day (TID) | OPHTHALMIC | Status: DC
  Filled 2013-08-15: qty 5

## 2013-08-15 MED ORDER — GABAPENTIN 600 MG PO TABS
600.0000 mg | ORAL_TABLET | Freq: Three times a day (TID) | ORAL | Status: DC
  Administered 2013-08-15 – 2013-08-18 (×10): 600 mg via ORAL
  Filled 2013-08-15 (×13): qty 1

## 2013-08-15 MED ORDER — INSULIN ASPART 100 UNIT/ML ~~LOC~~ SOLN
0.0000 [IU] | SUBCUTANEOUS | Status: DC
  Administered 2013-08-15: 2 [IU] via SUBCUTANEOUS

## 2013-08-15 MED ORDER — MELOXICAM 7.5 MG PO TABS
7.5000 mg | ORAL_TABLET | Freq: Every day | ORAL | Status: DC
  Administered 2013-08-15: 7.5 mg via ORAL
  Filled 2013-08-15: qty 1

## 2013-08-15 MED ORDER — ADENOSINE 6 MG/2ML IV SOLN
INTRAVENOUS | Status: AC
  Filled 2013-08-15: qty 8

## 2013-08-15 MED ORDER — TRAMADOL HCL 50 MG PO TABS
50.0000 mg | ORAL_TABLET | Freq: Three times a day (TID) | ORAL | Status: DC | PRN
  Administered 2013-08-15 – 2013-08-18 (×2): 50 mg via ORAL
  Filled 2013-08-15 (×2): qty 1

## 2013-08-15 MED ORDER — BACITRACIN-NEOMYCIN-POLYMYXIN OINTMENT TUBE
TOPICAL_OINTMENT | Freq: Once | CUTANEOUS | Status: AC
  Administered 2013-08-15: 13:00:00 via TOPICAL
  Filled 2013-08-15: qty 15

## 2013-08-15 MED ORDER — AMLODIPINE BESYLATE 5 MG PO TABS
5.0000 mg | ORAL_TABLET | Freq: Every day | ORAL | Status: DC
  Administered 2013-08-15 – 2013-08-18 (×4): 5 mg via ORAL
  Filled 2013-08-15 (×4): qty 1

## 2013-08-15 MED ORDER — ONDANSETRON HCL 4 MG/2ML IJ SOLN
4.0000 mg | Freq: Four times a day (QID) | INTRAMUSCULAR | Status: DC | PRN
  Administered 2013-08-17 (×2): 4 mg via INTRAVENOUS
  Filled 2013-08-15 (×2): qty 2

## 2013-08-15 MED ORDER — BENAZEPRIL HCL 40 MG PO TABS
40.0000 mg | ORAL_TABLET | Freq: Every day | ORAL | Status: DC
  Administered 2013-08-15 – 2013-08-18 (×4): 40 mg via ORAL
  Filled 2013-08-15 (×4): qty 1

## 2013-08-15 MED ORDER — ONDANSETRON HCL 4 MG/2ML IJ SOLN: 4.0000 mg | Freq: Three times a day (TID) | INTRAMUSCULAR | Status: DC | PRN

## 2013-08-15 MED ORDER — GLUCERNA SHAKE PO LIQD
237.0000 mL | Freq: Three times a day (TID) | ORAL | Status: DC
  Administered 2013-08-15 – 2013-08-18 (×11): 237 mL via ORAL

## 2013-08-15 MED ORDER — ACETAMINOPHEN 325 MG PO TABS
650.0000 mg | ORAL_TABLET | Freq: Once | ORAL | Status: AC
  Administered 2013-08-15: 650 mg via ORAL
  Filled 2013-08-15: qty 2

## 2013-08-15 NOTE — Evaluation (Signed)
Physical Therapy Evaluation Patient Details Name: Sarah Colon MRN: [id number] DOB: 01/25/1946 Today's Date: 08/15/2013 Time: [id number] PT Time Calculation (min): 40 min  PT Assessment / Plan / Recommendation History of Present Illness  Pt is a 68 y/o female admitted for elbow pain that has been limiting her function for 3 days PTA. Pt unable to ambulate with RW at home due to pain. Pt currently receiving HHPT 3x/week.  Clinical Impression  This patient presents with acute pain and decreased functional independence following the above mentioned procedure. At the time of PT eval, pt able to assist with bed mobility with LUE only, requiring significant assist to sit EOB and attempt standing. This patient is appropriate for skilled PT interventions to address functional limitations, improve safety and independence with functional mobility, and return to PLOF.     PT Assessment  Patient needs continued PT services    Follow Up Recommendations  SNF    Does the patient have the potential to tolerate intense rehabilitation      Barriers to Discharge Decreased caregiver support      Equipment Recommendations  3in1 (PT)    Recommendations for Other Services OT consult   Frequency Min 3X/week    Precautions / Restrictions Precautions Precautions: Fall Restrictions Weight Bearing Restrictions: No   Pertinent Vitals/Pain Pt reports significant pain in RUE during active motion and weightbearing.       Mobility  Bed Mobility Overal bed mobility: Needs Assistance Bed Mobility: Supine to Sit;Sit to Supine;Rolling Rolling: Min assist Supine to sit: Mod assist Sit to supine: Max assist General bed mobility comments: Pt able to assist with rolling with LUE only. Assist required for LE movement on/off bed, and for trunk elevation to full sit. Pt unable to control trunk at all during sit>supine.  Transfers Overall transfer level: Needs assistance General transfer comment: Attempted  standing at EOB and could not tolerate WB through feet due to burning sensation. Max assist provided to attempt stand, without success.     Exercises     PT Diagnosis: Difficulty walking;Acute pain  PT Problem List: Decreased strength;Decreased range of motion;Decreased activity tolerance;Decreased balance;Decreased mobility;Decreased knowledge of use of DME;Decreased safety awareness;Decreased knowledge of precautions;Pain PT Treatment Interventions: DME instruction;Gait training;Stair training;Functional mobility training;Therapeutic activities;Therapeutic exercise;Neuromuscular re-education;Patient/family education     PT Goals(Current goals can be found in the care plan section) Acute Rehab PT Goals Patient Stated Goal: To return home PT Goal Formulation: With patient Time For Goal Achievement: 08/29/13 Potential to Achieve Goals: Good  Visit Information  Last PT Received On: 08/15/13 Assistance Needed: +2 (for OOB) History of Present Illness: Pt is a 68 y/o female admitted for elbow pain that has been limiting her function for 3 days PTA. Pt unable to ambulate with RW at home due to pain. Pt currently receiving HHPT 3x/week.       Prior [place] expects to be discharged to:: Private residence Living Arrangements: Alone Available Help at Discharge: Family;Available PRN/intermittently Type of Home: Apartment Home Access: Stairs to enter Entrance Stairs-Number of Steps: 3 Entrance Stairs-Rails: Right Home Layout: One level Home Equipment: Cane - single point;Walker - 2 wheels Prior Function Level of Independence: Independent with assistive device(s) Comments: Been home from SNF for month and a half PTA. HHPT 3x/week currently. Communication Communication: No difficulties Dominant Hand: Right    Cognition  Cognition Arousal/Alertness: Awake/alert Behavior During Therapy: WFL for tasks assessed/performed Overall Cognitive Status: Within  Functional Limits for tasks assessed  Extremity/Trunk Assessment Upper Extremity Assessment Upper Extremity Assessment: RUE deficits/detail RUE Deficits / Details: 4-/5 strength in R Bicep, 3/5 strength in R tricep. Mild pain with pronation/supination; unable to tolerate resisted pro/sup due to wrist pain radially. Pt able to flex elow about 90 actively with no increase in range passively due to guarding from pain, and is 10 from full extension of elbow with no increase in range passively due to pain.  Lower Extremity Assessment Lower Extremity Assessment: Generalized weakness (Burning sensation noted in BLE's and B feet - DM neuropathy) Cervical / Trunk Assessment Cervical / Trunk Assessment: Kyphotic   Balance Balance Overall balance assessment: Needs assistance Sitting-balance support: Feet supported;Single extremity supported Sitting balance-Leahy Scale: Poor Postural control: Posterior lean  End of Session PT - End of Session Equipment Utilized During Treatment: Gait belt Activity Tolerance: Patient limited by fatigue;Patient limited by pain Patient left: in bed;with call bell/phone within reach Nurse Communication: Mobility status  GP     Jolyn Lent 08/15/2013, 12:00 PM  Jolyn Lent, PT, DPT Acute Rehabilitation Services Pager: [phone]

## 2013-08-15 NOTE — ED Notes (Signed)
C/O pain right arm, points to elbow area.  En route, EMS noted tachycardia, gave adenosine 6mg  with success.  Now presents in 184 hr.

## 2013-08-15 NOTE — ED Notes (Signed)
Returned to bedside with Adenosine, MD Sabra Heck stated patient spontaneously converted to Sinus Tach with rate @ 102. No interventions were attempted.

## 2013-08-15 NOTE — H&P (Signed)
[facility] History and Physical  [name] [id number] DOB: February 10, 1946 DOA: 08/14/2013  Referring physician: er PCP: Salena Saner., MD   Chief Complaint: elbow pain/fast HR  HPI: Sarah Colon is a 68 y.o. female  recently hospitalized and discharged to a nursing facility where she spent some time in rehabilitation after acute gouty arthritis complicated by her diabetes and congestive heart failure. Pain in right elbow since Saturday.  She secondary to pain, has been unable to get out of bed, has not walked in 2 days, has felt dehydrated, has been drinking fluids but not eating. She states that she usually walks with a walker and has chronically weak lower extremities. She has been getting physical therapy at her home until recently She called EMS, they found patient to be tachycardic with a pulse of approximately 180, she was given adenosine without significant improvement, she denieschest pain, shortness of breath palpitations nausea cough or any other complaints.   In the ER,  Labs show WBCs of 25,000, sedimentation rate of 90, temperature 100.9.  Tap was done in the ER, and case was discussed with an orthopedist: hold antibiotics, n.p.o. And they will see in consultation. Her tachycardia resolved spontaneously without meds/valsalva  Hospitalist were asked to admit   Review of Systems:  All systems reviewed, negative unless stated above   Past Medical History  Diagnosis Date  . Arthritis   . Diabetes mellitus without complication   . Chronic pain of left knee   . Gout dx 04/09/2013    knee tapped by ortho with monosodium urate crystals  . Hypertension    Past Surgical History  Procedure Laterality Date  . Skin grafts     Social History:  reports that she quit smoking about 16 years ago. She does not have any smokeless tobacco history on file. She reports that she does not drink alcohol or use illicit drugs.  Allergies  Allergen Reactions  . Codeine  Other (See Comments)    Causes shaking    History reviewed. No pertinent family history.   Prior to Admission medications   Medication Sig Start Date End Date Taking? Authorizing Provider  acetaminophen (TYLENOL) 325 MG tablet Take 2 tablets (650 mg total) by mouth every 6 (six) hours as needed for mild pain (or Fever >/= 101). 04/17/13  Yes Annita Brod, MD  amLODipine (NORVASC) 10 MG tablet Take 5 mg by mouth daily. 04/17/13  Yes Annita Brod, MD  benazepril (LOTENSIN) 40 MG tablet Take 40 mg by mouth daily.   Yes Historical Provider, MD  bimatoprost (LUMIGAN) 0.01 % SOLN Place 1 drop into both eyes at bedtime.   Yes Historical Provider, MD  brimonidine (ALPHAGAN P) 0.1 % SOLN Place 1 drop into both eyes 3 (three) times daily.   Yes Historical Provider, MD  dorzolamide-timolol (COSOPT) 22.3-6.8 MG/ML ophthalmic solution Place 1 drop into both eyes 2 (two) times daily.   Yes Historical Provider, MD  feeding supplement, GLUCERNA SHAKE, (GLUCERNA SHAKE) LIQD Take 237 mLs by mouth 3 (three) times daily between meals. 04/17/13  Yes Annita Brod, MD  gabapentin (NEURONTIN) 600 MG tablet Take 600 mg by mouth 3 (three) times daily.    Yes Historical Provider, MD  hydrochlorothiazide (MICROZIDE) 12.5 MG capsule Take 12.5 mg by mouth daily.   Yes Historical Provider, MD  meloxicam (MOBIC) 7.5 MG tablet Take 7.5 mg by mouth daily.   Yes Historical Provider, MD  metFORMIN (GLUCOPHAGE-XR) 500 MG 24 hr tablet Take 1  tablet (500 mg total) by mouth 2 (two) times daily. 04/17/13  Yes Annita Brod, MD  oxyCODONE (OXY IR/ROXICODONE) 5 MG immediate release tablet Take one tablet by mouth every 6 hours as needed for pain 06/22/13  Yes Claudette T Levie Heritage, NP  traMADol (ULTRAM) 50 MG tablet Take 1 tablet (50 mg total) by mouth 3 (three) times daily as needed (pain). 04/18/13  Yes Pricilla Larsson, NP   Physical Exam: Filed Vitals:   08/15/13 0630  BP: 127/69  Pulse: 78  Temp:   Resp: 19     BP 127/69  Pulse 78  Temp(Src) 100.9 F (38.3 C) (Oral)  Resp 19  Ht 5' (1.524 m)  Wt 57.153 kg (126 lb)  BMI 24.61 kg/m2  SpO2 97%  General:  Appears calm and comfortable Eyes: PERRL, normal lids, irises & conjunctiva ENT: grossly normal hearing, lips & tongue Neck: no LAD, masses or thyromegaly Cardiovascular: RRR, no m/r/g. No LE edema. Respiratory: CTA bilaterally, no w/r/r. Normal respiratory effort. Abdomen: soft, ntnd Skin: right elbow swollen and warm Musculoskeletal: weak b/l LE; decrease range of motion due to pain Psychiatric: grossly normal mood and affect, speech fluent and appropriate Neurologic: grossly non-focal.          Labs on Admission:  Basic Metabolic Panel:  Recent Labs Lab 08/15/13 0154  NA 139  K 3.9  CL 97  CO2 23  GLUCOSE 170*  BUN 20  CREATININE 1.18*  CALCIUM 9.4   Liver Function Tests: No results found for this basename: AST, ALT, ALKPHOS, BILITOT, PROT, ALBUMIN,  in the last 168 hours No results found for this basename: LIPASE, AMYLASE,  in the last 168 hours No results found for this basename: AMMONIA,  in the last 168 hours CBC:  Recent Labs Lab 08/15/13 0154  WBC 25.4*  NEUTROABS 21.1*  HGB 11.1*  HCT 30.9*  MCV 81.7  PLT 430*   Cardiac Enzymes: No results found for this basename: CKTOTAL, CKMB, CKMBINDEX, TROPONINI,  in the last 168 hours  BNP (last 3 results)  Recent Labs  04/12/13 1822 04/14/13 0540  PROBNP 1377.0* 671.6*   CBG:  Recent Labs Lab 08/15/13 0010  GLUCAP 150*    Radiological Exams on Admission: No results found.  EKG: Independently reviewed. Sinus tach- rate of 100  Assessment/Plan Active Problems:   Type 2 diabetes mellitus with diabetic neuropathic arthropathy   Leukocytosis, unspecified   Gout   Hypertension   Septic arthritis  Right elbow pain- s/p arthrocentesis- await cultures- gram stains; NPO  Fever- hold on abx until seen by ortho  Leukocytosis- monitor- holding  abx until seen by ortho  Gout h/o  DM- continue metformin, SSI  HTN- resume home meds  SVT- resolved  PT/OT eval  Ortho by ER  Code Status: full Family Communication: patient Disposition Plan: 2-3 days  Time spent: 75 min  Eulogio Bear [facility] Pager [phone]

## 2013-08-15 NOTE — ED Notes (Signed)
9 ml purulent yellow fluid aspirated from right elbow.  Will send to lab for testing.

## 2013-08-15 NOTE — ED Notes (Signed)
Dr Sabra Heck in room aspirating right elbow.

## 2013-08-15 NOTE — Consult Note (Signed)
Reason for Consult: Left elbow evaluation Referring Physician: Nonie Colon Sarah Colon Sarah an 68 y.o. female.  HPI: 68 year old female who presents with bilateral knee pain and right elbow discomfort.  She Sarah a history of gout in the past. In December 2014 she was seen and treated with a high white count and gouty arthropathy. The a patient's notes reflect this in detail.  The patient notes that both knees are painful. she denies fever chills nausea vomiting. She states her elbow Sarah slightly tender. She had an elbow aspiration today which was sent for culture. At present time she notes no significant pain in her elbow. She remains neurovascularly intact.  She was admitted due to high white count as well as concerns about her elbow. I was asked to see her by Dr. Doran Durand. I performed a thorough chart review with Mr. Jenean Lindau  She Sarah pleasant alert and oriented and notes no history of trauma or obvious inciting event. She Sarah completely unaware of a history of gout it appears.  Past Medical History  Diagnosis Date  . Arthritis   . Diabetes mellitus without complication   . Chronic pain of left knee   . Gout dx 04/09/2013    knee tapped by ortho with monosodium urate crystals  . Hypertension     Past Surgical History  Procedure Laterality Date  . Skin grafts      History reviewed. No pertinent family history.  Social History:  reports that she quit smoking about 16 years ago. She does not have any smokeless tobacco history on file. She reports that she does not drink alcohol or use illicit drugs.  Allergies:  Allergies  Allergen Reactions  . Codeine Other (See Comments)    Causes shaking    Medications: I have reviewed the patient's current medications.  Results for orders placed during the hospital encounter of 08/14/13 (from the past 48 hour(s))  CBG MONITORING, ED     Status: Abnormal   Collection Time    08/15/13 12:10 AM      Result Value Ref Range   Glucose-Capillary  150 (*) 70 - 99 mg/dL  URINALYSIS, ROUTINE W REFLEX MICROSCOPIC     Status: Abnormal   Collection Time    08/15/13  1:36 AM      Result Value Ref Range   Color, Urine YELLOW  YELLOW   APPearance CLOUDY (*) CLEAR   Specific Gravity, Urine 1.016  1.005 - 1.030   pH 5.5  5.0 - 8.0   Glucose, UA NEGATIVE  NEGATIVE mg/dL   Hgb urine dipstick MODERATE (*) NEGATIVE   Bilirubin Urine NEGATIVE  NEGATIVE   Ketones, ur 15 (*) NEGATIVE mg/dL   Protein, ur 100 (*) NEGATIVE mg/dL   Urobilinogen, UA 1.0  0.0 - 1.0 mg/dL   Nitrite POSITIVE (*) NEGATIVE   Leukocytes, UA SMALL (*) NEGATIVE  URINE MICROSCOPIC-ADD ON     Status: Abnormal   Collection Time    08/15/13  1:36 AM      Result Value Ref Range   WBC, UA 7-10  <3 WBC/hpf   RBC / HPF 3-6  <3 RBC/hpf   Bacteria, UA MANY (*) RARE   Casts HYALINE CASTS (*) NEGATIVE   Urine-Other LESS THAN 10 mL OF URINE SUBMITTED    CBC WITH DIFFERENTIAL     Status: Abnormal   Collection Time    08/15/13  1:54 AM      Result Value Ref Range   WBC 25.4 (*) 4.0 -  10.5 K/uL   RBC 3.78 (*) 3.87 - 5.11 MIL/uL   Hemoglobin 11.1 (*) 12.0 - 15.0 g/dL   HCT 30.9 (*) 36.0 - 46.0 %   MCV 81.7  78.0 - 100.0 fL   MCH 29.4  26.0 - 34.0 pg   MCHC 35.9  30.0 - 36.0 g/dL   RDW 14.2  11.5 - 15.5 %   Platelets 430 (*) 150 - 400 K/uL   Neutrophils Relative % 83 (*) 43 - 77 %   Lymphocytes Relative 7 (*) 12 - 46 %   Monocytes Relative 10  3 - 12 %   Eosinophils Relative 0  0 - 5 %   Basophils Relative 0  0 - 1 %   Neutro Abs 21.1 (*) 1.7 - 7.7 K/uL   Lymphs Abs 1.8  0.7 - 4.0 K/uL   Monocytes Absolute 2.5 (*) 0.1 - 1.0 K/uL   Eosinophils Absolute 0.0  0.0 - 0.7 K/uL   Basophils Absolute 0.0  0.0 - 0.1 K/uL   RBC Morphology TARGET CELLS    BASIC METABOLIC PANEL     Status: Abnormal   Collection Time    08/15/13  1:54 AM      Result Value Ref Range   Sodium 139  137 - 147 mEq/L   Potassium 3.9  3.7 - 5.3 mEq/L   Chloride 97  96 - 112 mEq/L   CO2 23  19 - 32 mEq/L    Glucose, Bld 170 (*) 70 - 99 mg/dL   BUN 20  6 - 23 mg/dL   Creatinine, Ser 1.18 (*) 0.50 - 1.10 mg/dL   Calcium 9.4  8.4 - 10.5 mg/dL   GFR calc non Af Amer 46 (*) >90 mL/min   GFR calc Af Amer 53 (*) >90 mL/min   Comment: (NOTE)     The eGFR has been calculated using the CKD EPI equation.     This calculation has not been validated in all clinical situations.     eGFR's persistently <90 mL/min signify possible Chronic Kidney     Disease.  SEDIMENTATION RATE     Status: Abnormal   Collection Time    08/15/13  1:54 AM      Result Value Ref Range   Sed Rate 90 (*) 0 - 22 mm/hr   Comment: CORRECTED ON 03/18 AT 0353: PREVIOUSLY REPORTED AS 28  URIC ACID     Status: Abnormal   Collection Time    08/15/13  1:54 AM      Result Value Ref Range   Uric Acid, Serum 9.9 (*) 2.4 - 7.0 mg/dL  I-STAT CG4 LACTIC ACID, ED     Status: Colon   Collection Time    08/15/13  2:02 AM      Result Value Ref Range   Lactic Acid, Venous 1.91  0.5 - 2.2 mmol/L  I-STAT TROPOININ, ED     Status: Colon   Collection Time    08/15/13  2:11 AM      Result Value Ref Range   Troponin i, poc 0.01  0.00 - 0.08 ng/mL   Comment 3            Comment: Due to the release kinetics of cTnI,     a negative result within the first hours     of the onset of symptoms does not rule out     myocardial infarction with certainty.     If myocardial infarction Sarah still suspected,  repeat the test at appropriate intervals.  SYNOVIAL CELL COUNT + DIFF, W/ CRYSTALS     Status: Abnormal   Collection Time    08/15/13  3:40 AM      Result Value Ref Range   Color, Synovial YELLOW  YELLOW   Appearance-Synovial TURBID (*) CLEAR   Crystals, Fluid NO CRYSTALS SEEN     WBC, Synovial 38300 (*) 0 - 200 /cu mm   Neutrophil, Synovial 95 (*) 0 - 25 %   Lymphocytes-Synovial Fld 1  0 - 20 %   Monocyte-Macrophage-Synovial Fluid 4 (*) 50 - 90 %   Eosinophils-Synovial 0  0 - 1 %  BODY FLUID CULTURE     Status: Colon   Collection Time     08/15/13  3:40 AM      Result Value Ref Range   Specimen Description FLUID SYNOVIAL RIGHT ARM     Special Requests ELBOW     Gram Stain       Value: ABUNDANT WBC PRESENT, PREDOMINANTLY PMN     NO ORGANISMS SEEN     Performed at [facility]   Culture PENDING     Report Status PENDING    PROTEIN, BODY FLUID     Status: Colon   Collection Time    08/15/13  3:40 AM      Result Value Ref Range   Total protein, fluid 5.7     Comment: NO NORMAL RANGE ESTABLISHED FOR THIS TEST   Fluid Type-FTP SYNOVIAL     Comment: RIGHT     ARM  GLUCOSE, SEROUS FLUID     Status: Colon   Collection Time    08/15/13  3:40 AM      Result Value Ref Range   Glucose, Fluid 77     Comment:            FLUID GLUCOSE LEVELS OF <60     mg/dL OR VALUES OF 40 mg/dL     LESS THAN A SIMULTANEOUS     SERUM LEVEL ARE CONSIDERED     DECREASED.   Fluid Type-FGLU SYNOVIAL     Comment: RIGHT     ARM  ANAEROBIC CULTURE     Status: Colon   Collection Time    08/15/13  3:40 AM      Result Value Ref Range   Specimen Description FLUID SYNOVIAL RIGHT ARM     Special Requests Normal RIGHT ELBOW     Gram Stain       Value: ABUNDANT WBC PRESENT, PREDOMINANTLY PMN     NO ORGANISMS SEEN     Performed at [facility]   Culture PENDING     Report Status PENDING    GRAM STAIN     Status: Colon   Collection Time    08/15/13  3:41 AM      Result Value Ref Range   Specimen Description FLUID SYNOVIAL RIGHT ARM     Special Requests ELBOW     Gram Stain       Value: NO ORGANISMS SEEN     ABUNDANT WBC PRESENT, PREDOMINANTLY PMN     Results Called to: Jolee Ewing [id number]   Report Status 08/15/2013 FINAL    GLUCOSE, CAPILLARY     Status: Abnormal   Collection Time    08/15/13  9:45 AM      Result Value Ref Range   Glucose-Capillary 130 (*) 70 - 99 mg/dL  GLUCOSE, CAPILLARY     Status:  Abnormal   Collection Time    08/15/13 11:54 AM      Result Value Ref Range   Glucose-Capillary 140 (*) 70 - 99  mg/dL    No results found.  Review of Systems  Constitutional: Negative.   Eyes: Negative.   Respiratory: Negative.   Cardiovascular: Negative.   Gastrointestinal: Negative.   Genitourinary: Negative.   Skin: Negative.   Neurological: Negative.   Endo/Heme/Allergies: Negative.    Blood pressure 146/61, pulse 91, temperature 99.2 F (37.3 C), temperature source Oral, resp. rate 20, height 5' (1.524 m), weight 57.153 kg (126 lb), SpO2 99.00%. Physical Exam right elbow has no cellulitis or erythema. She has normal sensation. She complains of thumb pain at the base. This Sarah likely arthritic. She has normal pulse and refill to the fingers. She has no evidence of radial median or ulnar nerve dysfunction. She has no signs of obvious infection in the elbow such as a septic joint. She has soft compartments. Her elbow Sarah nontender to gentle range of motion including pronation supination flexion and extension through a limited arc. I do not see any evidence of a skin lesion. There Sarah some mild swelling in the elbow.  Shoulders are stable and nontender. Left upper extremity Sarah neurovascular intact and nontender.  Bilateral knee exam shows positive effusions bilaterally with significant pain. The right knee Sarah slightly warm the left Sarah not particularly warm. Ankle and foot examination Sarah benign with normal pulse soft compartments and no signs of DVT  Pelvis Sarah stable.  Abdomen Sarah nontender nondistended  Chest Sarah clear to  HEENT Sarah within normal limits.  She does not have any obvious skin lesion or erythema noted. I've reviewed this with her at length and the findings.    Assessment/Plan: #1 right elbow effusion status post aspiration in the ER #2 history of gout in December 2014 #3 bilateral knee effusions with pain Patient Active Problem List   Diagnosis Date Noted  . Septic arthritis 08/15/2013  . Fever 08/15/2013  . SVT (supraventricular tachycardia) 08/15/2013  . Hypokalemia  05/09/2013  . Physical deconditioning 04/18/2013  . Chronic pain of left knee   . Gout   . Hypertension   . Unspecified constipation 04/14/2013  . Leukocytosis, unspecified 04/13/2013  . Acute on chronic diastolic heart failure 36/64/4034  . Chronic diastolic heart failure 74/25/9563  . Fatty liver 04/11/2013  . Transaminitis 04/11/2013  . Acute gouty arthritis 04/10/2013  . 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 Knee pain 04/08/2013  . Sepsis 04/08/2013  . Septic arthritis of knee 04/08/2013      I have gone ahead and discussed with her aspiration and knees. She consented to this repair she's prepped and draped in a sterile fashion we performed a bilateral knee aspiration. We sent the right knee which Sarah more impressive for cell cannot aerobic and anaerobic culture. Left knee was also cultured with aerobic and anaerobic cultures. I discussed the patient findings. We placed a small my lidocaine and Celestone in the knee joint afterwards. This was not an obviously infected joint but would give suspicion for gout.  She remained neurovascularly intact. The patient was treated with Xeroform Neosporin Curlex and the wraps.  We wrapped the elbow with a regime of Neosporin Xeroform and Ace wrap.  We'll watch her condition closely.  We have discussed with the hospitalist (vertebral phone call) that we feel this Sarah likely a gout issue. This Sarah a identical picture when she  was admitted previously.  I would certainly note the patient needs some instruction on gout but she was unaware she ever had this in my opinion.  I have discussed all issues with the patient at length and the relevant due to and don'ts.  We'll watch her elbow. I would not recommend any open debridement or surgical I&D at this juncture. There Sarah no fluctuance erythema or advanced process.  Paulene Floor 08/15/2013, 1:06 PM

## 2013-08-15 NOTE — ED Notes (Signed)
While I was with a critical care pt, lab attempted to draw pt blood x 2 without success. Blood draw now completed.

## 2013-08-15 NOTE — ED Notes (Signed)
MD Sabra Heck at bedside to evaluate. Patient currently tachycardic with rates >170bpm. MD requested Adenosine.

## 2013-08-15 NOTE — ED Notes (Signed)
Pt has been sleeping most of the night.  Awakens easily to voice. Decision to admit, awaiting bed assignment and orders.

## 2013-08-15 NOTE — ED Notes (Signed)
Lab called to correct lab results on sed rate.  It was reported as 28, actual result is 90. Reported to dr Sabra Heck.

## 2013-08-15 NOTE — Consult Note (Signed)
Reason for Consult: Painful right elbow with high WBC count Referring Physician: ER  Sarah Colon is an 68 y.o. female.  HPI: Pt with PMHx of CHF, gout, DM type II, and arthritis brought to the ER for severe pain in right elbow x 4 days.  Pt was found by her daughter in her apartment unable to move because of the pain in right elbow. Pt states she was unable to put weight on the right hand to use her walker and was therefore unable to ambulate.  Pt stated the pain began in her right thumb but radiated to right lateral elbow and states now this is the greatest area of pain.  Pt points to the right radial collateral ligament as the source of the greatest amount of pain.  Pt denies N/V/F/C, chest pain, SOB, calf pain, or paresthesia b/l.  Past Medical History  Diagnosis Date  . Arthritis   . Diabetes mellitus without complication   . Chronic pain of left knee   . Gout dx 04/09/2013    knee tapped by ortho with monosodium urate crystals  . Hypertension     Past Surgical History  Procedure Laterality Date  . Skin grafts      History reviewed. No pertinent family history.  Social History:  reports that she quit smoking about 16 years ago. She does not have any smokeless tobacco history on file. She reports that she does not drink alcohol or use illicit drugs.  Allergies:  Allergies  Allergen Reactions  . Codeine Other (See Comments)    Causes shaking    Medications: I have reviewed the patient's current medications.  Results for orders placed during the hospital encounter of 08/14/13 (from the past 48 hour(s))  CBG MONITORING, ED     Status: Abnormal   Collection Time    08/15/13 12:10 AM      Result Value Ref Range   Glucose-Capillary 150 (*) 70 - 99 mg/dL  URINALYSIS, ROUTINE W REFLEX MICROSCOPIC     Status: Abnormal   Collection Time    08/15/13  1:36 AM      Result Value Ref Range   Color, Urine YELLOW  YELLOW   APPearance CLOUDY (*) CLEAR   Specific Gravity, Urine  1.016  1.005 - 1.030   pH 5.5  5.0 - 8.0   Glucose, UA NEGATIVE  NEGATIVE mg/dL   Hgb urine dipstick MODERATE (*) NEGATIVE   Bilirubin Urine NEGATIVE  NEGATIVE   Ketones, ur 15 (*) NEGATIVE mg/dL   Protein, ur 100 (*) NEGATIVE mg/dL   Urobilinogen, UA 1.0  0.0 - 1.0 mg/dL   Nitrite POSITIVE (*) NEGATIVE   Leukocytes, UA SMALL (*) NEGATIVE  URINE MICROSCOPIC-ADD ON     Status: Abnormal   Collection Time    08/15/13  1:36 AM      Result Value Ref Range   WBC, UA 7-10  <3 WBC/hpf   RBC / HPF 3-6  <3 RBC/hpf   Bacteria, UA MANY (*) RARE   Casts HYALINE CASTS (*) NEGATIVE   Urine-Other LESS THAN 10 mL OF URINE SUBMITTED    CBC WITH DIFFERENTIAL     Status: Abnormal   Collection Time    08/15/13  1:54 AM      Result Value Ref Range   WBC 25.4 (*) 4.0 - 10.5 K/uL   RBC 3.78 (*) 3.87 - 5.11 MIL/uL   Hemoglobin 11.1 (*) 12.0 - 15.0 g/dL   HCT 30.9 (*) 36.0 - 46.0 %  MCV 81.7  78.0 - 100.0 fL   MCH 29.4  26.0 - 34.0 pg   MCHC 35.9  30.0 - 36.0 g/dL   RDW 14.2  11.5 - 15.5 %   Platelets 430 (*) 150 - 400 K/uL   Neutrophils Relative % 83 (*) 43 - 77 %   Lymphocytes Relative 7 (*) 12 - 46 %   Monocytes Relative 10  3 - 12 %   Eosinophils Relative 0  0 - 5 %   Basophils Relative 0  0 - 1 %   Neutro Abs 21.1 (*) 1.7 - 7.7 K/uL   Lymphs Abs 1.8  0.7 - 4.0 K/uL   Monocytes Absolute 2.5 (*) 0.1 - 1.0 K/uL   Eosinophils Absolute 0.0  0.0 - 0.7 K/uL   Basophils Absolute 0.0  0.0 - 0.1 K/uL   RBC Morphology TARGET CELLS    BASIC METABOLIC PANEL     Status: Abnormal   Collection Time    08/15/13  1:54 AM      Result Value Ref Range   Sodium 139  137 - 147 mEq/L   Potassium 3.9  3.7 - 5.3 mEq/L   Chloride 97  96 - 112 mEq/L   CO2 23  19 - 32 mEq/L   Glucose, Bld 170 (*) 70 - 99 mg/dL   BUN 20  6 - 23 mg/dL   Creatinine, Ser 1.18 (*) 0.50 - 1.10 mg/dL   Calcium 9.4  8.4 - 10.5 mg/dL   GFR calc non Af Amer 46 (*) >90 mL/min   GFR calc Af Amer 53 (*) >90 mL/min   Comment: (NOTE)      The eGFR has been calculated using the CKD EPI equation.     This calculation has not been validated in all clinical situations.     eGFR's persistently <90 mL/min signify possible Chronic Kidney     Disease.  SEDIMENTATION RATE     Status: Abnormal   Collection Time    08/15/13  1:54 AM      Result Value Ref Range   Sed Rate 90 (*) 0 - 22 mm/hr   Comment: CORRECTED ON 03/18 AT 0353: PREVIOUSLY REPORTED AS 28  URIC ACID     Status: Abnormal   Collection Time    08/15/13  1:54 AM      Result Value Ref Range   Uric Acid, Serum 9.9 (*) 2.4 - 7.0 mg/dL  I-STAT CG4 LACTIC ACID, ED     Status: None   Collection Time    08/15/13  2:02 AM      Result Value Ref Range   Lactic Acid, Venous 1.91  0.5 - 2.2 mmol/L  I-STAT TROPOININ, ED     Status: None   Collection Time    08/15/13  2:11 AM      Result Value Ref Range   Troponin i, poc 0.01  0.00 - 0.08 ng/mL   Comment 3            Comment: Due to the release kinetics of cTnI,     a negative result within the first hours     of the onset of symptoms does not rule out     myocardial infarction with certainty.     If myocardial infarction is still suspected,     repeat the test at appropriate intervals.  SYNOVIAL CELL COUNT + DIFF, W/ CRYSTALS     Status: Abnormal   Collection Time    08/15/13  3:40 AM      Result Value Ref Range   Color, Synovial YELLOW  YELLOW   Appearance-Synovial TURBID (*) CLEAR   Crystals, Fluid NO CRYSTALS SEEN     WBC, Synovial 38300 (*) 0 - 200 /cu mm   Neutrophil, Synovial 95 (*) 0 - 25 %   Lymphocytes-Synovial Fld 1  0 - 20 %   Monocyte-Macrophage-Synovial Fluid 4 (*) 50 - 90 %   Eosinophils-Synovial 0  0 - 1 %  PROTEIN, BODY FLUID     Status: None   Collection Time    08/15/13  3:40 AM      Result Value Ref Range   Total protein, fluid 5.7     Comment: NO NORMAL RANGE ESTABLISHED FOR THIS TEST   Fluid Type-FTP SYNOVIAL     Comment: RIGHT     ARM  GLUCOSE, SEROUS FLUID     Status: None   Collection  Time    08/15/13  3:40 AM      Result Value Ref Range   Glucose, Fluid 77     Comment:            FLUID GLUCOSE LEVELS OF <60     mg/dL OR VALUES OF 40 mg/dL     LESS THAN A SIMULTANEOUS     SERUM LEVEL ARE CONSIDERED     DECREASED.   Fluid Type-FGLU SYNOVIAL     Comment: RIGHT     ARM  GRAM STAIN     Status: None   Collection Time    08/15/13  3:41 AM      Result Value Ref Range   Specimen Description FLUID SYNOVIAL RIGHT ARM     Special Requests ELBOW     Gram Stain       Value: NO ORGANISMS SEEN     ABUNDANT WBC PRESENT, PREDOMINANTLY PMN     Results Called to: Jolee Ewing [id number] [place]   Report Status 08/15/2013 FINAL      No results found.  Review of Systems  Constitutional: Negative.   HENT: Negative.   Eyes: Negative.   Respiratory: Negative.   Cardiovascular: Negative.   Gastrointestinal: Negative.   Musculoskeletal: Positive for joint pain. Negative for back pain, falls, myalgias and neck pain.  Skin: Negative.   Neurological: Negative for dizziness.  Endo/Heme/Allergies: Negative.   Psychiatric/Behavioral: The patient is not nervous/anxious.    Blood pressure 127/69, pulse 78, temperature 100.9 F (38.3 C), temperature source Oral, resp. rate 19, height 5' (1.524 m), weight 57.153 kg (126 lb), SpO2 97.00%. Physical Exam Thin appearing 68y/o female in NAD, A/Ox3, appears stated age. EOMI, mood and affect normal, respirations unlabored. On physical exam right elbow has bandage covering site of aspiration. Bandage taken down. Elbow is mildly erythematous and warm with +TTP to the right lateral epicondyle. No TTP to olecranon or antecubital. Right elbow has decreased ROM due to pain with roughly 90 degrees of flexion and lacking 10 degrees of full extension.  Pt is unable to squeeze my fingers with any degree strength. Radial pulse 2+ with good sensation to the distal fingers b/l. Assessment/Plan: Pt to be admitted to [facility] under the hospitalist with Dr.  Amedeo Plenty assuming orthopaedic care.  Aerobic and anaerobic cultures pending.   Mellanie Bejarano S 08/15/2013, 8:57 AM

## 2013-08-15 NOTE — ED Provider Notes (Signed)
CSN: [id number]     Arrival date & time 08/14/13  2355 History   First MD Initiated Contact with Patient 08/15/13 0020     Chief Complaint  Patient presents with  . Arm Pain     (Consider location/radiation/quality/duration/timing/severity/associated sxs/prior Treatment) HPI Comments: 68 year old female, history of gouty arthritis who was recently hospitalized and discharged to a nursing facility where she spent some time in rehabilitation after acute gouty arthritis complicated by her diabetes and congestive heart failure. The patient returns to the hospital today because of gradual onset of persistent gradually worsening severe pain of her right elbow. She states that her pain has been so bad that she has been unable to get out of bed, has not walked in 2 days, has felt dehydrated, has been drinking fluids but not eating. She does not live with any family members, she called an ambulance transport. Paramedics found patient to be tachycardic with a pulse of approximately 180, she was given adenosine without significant improvement, she denied chest pain shortness of breath palpitations nausea cough or any other complaints. She states that she usually walks with a walker and has chronically weak lower extremities. She has been getting physical therapy at her home until recently  Patient is a 68 y.o. female presenting with arm pain. The history is provided by the patient.  Arm Pain    Past Medical History  Diagnosis Date  . Arthritis   . Diabetes mellitus without complication   . Chronic pain of left knee   . Gout dx 04/09/2013    knee tapped by ortho with monosodium urate crystals  . Hypertension    Past Surgical History  Procedure Laterality Date  . Skin grafts     History reviewed. No pertinent family history. History  Substance Use Topics  . Smoking status: Former Smoker    Quit date: 05/31/1997  . Smokeless tobacco: Not on file  . Alcohol Use: No   OB History   Grav Para  Term Preterm Abortions TAB SAB Ect Mult Living                 Review of Systems  All other systems reviewed and are negative.      Allergies  Codeine  Home Medications   Current Outpatient Rx  Name  Route  Sig  Dispense  Refill  . acetaminophen (TYLENOL) 325 MG tablet   Oral   Take 2 tablets (650 mg total) by mouth every 6 (six) hours as needed for mild pain (or Fever >/= 101).         Marland Kitchen amLODipine (NORVASC) 10 MG tablet   Oral   Take 5 mg by mouth daily.         . benazepril (LOTENSIN) 40 MG tablet   Oral   Take 40 mg by mouth daily.         . bimatoprost (LUMIGAN) 0.01 % SOLN   Both Eyes   Place 1 drop into both eyes at bedtime.         . brimonidine (ALPHAGAN P) 0.1 % SOLN   Both Eyes   Place 1 drop into both eyes 3 (three) times daily.         . dorzolamide-timolol (COSOPT) 22.3-6.8 MG/ML ophthalmic solution   Both Eyes   Place 1 drop into both eyes 2 (two) times daily.         . feeding supplement, GLUCERNA SHAKE, (GLUCERNA SHAKE) LIQD   Oral   Take 237 mLs by  mouth 3 (three) times daily between meals.      0   . gabapentin (NEURONTIN) 600 MG tablet   Oral   Take 600 mg by mouth 3 (three) times daily.          . hydrochlorothiazide (MICROZIDE) 12.5 MG capsule   Oral   Take 12.5 mg by mouth daily.         . meloxicam (MOBIC) 7.5 MG tablet   Oral   Take 7.5 mg by mouth daily.         . metFORMIN (GLUCOPHAGE-XR) 500 MG 24 hr tablet   Oral   Take 1 tablet (500 mg total) by mouth 2 (two) times daily.   60 tablet   0   . oxyCODONE (OXY IR/ROXICODONE) 5 MG immediate release tablet      Take one tablet by mouth every 6 hours as needed for pain   120 tablet   0   . traMADol (ULTRAM) 50 MG tablet   Oral   Take 1 tablet (50 mg total) by mouth 3 (three) times daily as needed (pain).   90 tablet   5    BP 127/69  Pulse 78  Temp(Src) 100.9 F (38.3 C) (Oral)  Resp 19  Ht 5' (1.524 m)  Wt 126 lb (57.153 kg)  BMI 24.61  kg/m2  SpO2 97% Physical Exam  Nursing note and vitals reviewed. Constitutional: She appears well-developed and well-nourished. No distress.  HENT:  Head: Normocephalic and atraumatic.  Mouth/Throat: Oropharynx is clear and moist. No oropharyngeal exudate.  Eyes: Conjunctivae and EOM are normal. Pupils are equal, round, and reactive to light. Right eye exhibits no discharge. Left eye exhibits no discharge. No scleral icterus.  Neck: Normal range of motion. Neck supple. No JVD present. No thyromegaly present.  Cardiovascular: Regular rhythm, normal heart sounds and intact distal pulses.  Exam reveals no gallop and no friction rub.   No murmur heard. Severe Tachycardia, good pulses  Pulmonary/Chest: Effort normal and breath sounds normal. No respiratory distress. She has no wheezes. She has no rales.  Abdominal: Soft. Bowel sounds are normal. She exhibits no distension and no mass. There is no tenderness.  Musculoskeletal: She exhibits tenderness. She exhibits no edema.  Decreased range of motion of the right elbow secondary to effusion, warmth, pain. Normal range of motion of the right shoulder and wrist though this does cause some elbow pain. Pain with pronation and supination of the right upper extremity. Left upper exam he normal, bilateral lower extremities without edema.  Lymphadenopathy:    She has no cervical adenopathy.  Neurological: She is alert. Coordination normal.  Weakness of the bilateral lower extremities, unable to lift legs off of the bed.  Skin: Skin is warm and dry. No rash noted. No erythema.  Psychiatric: She has a normal mood and affect. Her behavior is normal.    ED Course  ARTHOCENTESIS Date/Time: 08/15/2013 3:41 AM Performed by: Noemi Chapel D Authorized by: Noemi Chapel D Consent: written consent obtained. Risks and benefits: risks, benefits and alternatives were discussed Consent given by: patient Patient understanding: patient states understanding of the  procedure being performed Patient identity confirmed: verbally with patient Time out: Immediately prior to procedure a "time out" was called to verify the correct patient, procedure, equipment, support staff and site/side marked as required. Indications: joint swelling,  pain,  possible septic joint and diagnostic evaluation  Body area: elbow Joint: right elbow Local anesthesia used: yes Anesthesia: local infiltration Local anesthetic: lidocaine  1% with epinephrine Anesthetic total: 3 ml Patient sedated: no Preparation: Patient was prepped and draped in the usual sterile fashion. Needle gauge: 18 G Ultrasound guidance: no Approach: lateral Aspirate: cloudy Aspirate amount: 9 ml Patient tolerance: Patient tolerated the procedure well with no immediate complications.   (including critical care time) Labs Review Labs Reviewed  CBC WITH DIFFERENTIAL - Abnormal; Notable for the following:    WBC 25.4 (*)    RBC 3.78 (*)    Hemoglobin 11.1 (*)    HCT 30.9 (*)    Platelets 430 (*)    Neutrophils Relative % 83 (*)    Lymphocytes Relative 7 (*)    Neutro Abs 21.1 (*)    Monocytes Absolute 2.5 (*)    All other components within normal limits  BASIC METABOLIC PANEL - Abnormal; Notable for the following:    Glucose, Bld 170 (*)    Creatinine, Ser 1.18 (*)    GFR calc non Af Amer 46 (*)    GFR calc Af Amer 53 (*)    All other components within normal limits  URINALYSIS, ROUTINE W REFLEX MICROSCOPIC - Abnormal; Notable for the following:    APPearance CLOUDY (*)    Hgb urine dipstick MODERATE (*)    Ketones, ur 15 (*)    Protein, ur 100 (*)    Nitrite POSITIVE (*)    Leukocytes, UA SMALL (*)    All other components within normal limits  SEDIMENTATION RATE - Abnormal; Notable for the following:    Sed Rate 90 (*)    All other components within normal limits  URINE MICROSCOPIC-ADD ON - Abnormal; Notable for the following:    Bacteria, UA MANY (*)    Casts HYALINE CASTS (*)    All  other components within normal limits  URIC ACID - Abnormal; Notable for the following:    Uric Acid, Serum 9.9 (*)    All other components within normal limits  SYNOVIAL CELL COUNT + DIFF, W/ CRYSTALS - Abnormal; Notable for the following:    Appearance-Synovial TURBID (*)    WBC, Synovial 38300 (*)    Neutrophil, Synovial 95 (*)    Monocyte-Macrophage-Synovial Fluid 4 (*)    All other components within normal limits  CBG MONITORING, ED - Abnormal; Notable for the following:    Glucose-Capillary 150 (*)    All other components within normal limits  GRAM STAIN  BODY FLUID CULTURE  PROTEIN, BODY FLUID  GLUCOSE, SEROUS FLUID  I-STAT TROPOININ, ED  I-STAT CG4 LACTIC ACID, ED   Imaging Review No results found.   EKG Interpretation   Date/Time:  Wednesday August 15 2013 00:14:36 EDT Ventricular Rate:  100 PR Interval:  111 QRS Duration: 86 QT Interval:  350 QTC Calculation: 451 R Axis:   65 Text Interpretation:  Sinus tachycardia Probable LVH with secondary repol  abnrm Non-specific ST-t changes Abnormal ekg Since last tracing  Supraventricular tachycardia has resolved Confirmed by Alecea Trego  MD, Nathanie Ottley  [phone]) on 08/15/2013 12:41:14 AM      MDM   Final diagnoses:  Septic arthritis  UTI (lower urinary tract infection)    The patient has a fever, tachycardia resolved spontaneously on my evaluation without Valsalva and without medications. She has been having right upper extremity pain which is been going on for several days and is likely acute gouty arthritis however given her fever would consider other etiologies as well. Labs ordered including blood count, sedimentation rate, pain medications ordered, EKG now shows sinus rhythm,  normal axis, nonspecific ST changes diffusely. She will need admission to the hospital for her complicated multisystem workup.  Laboratory workup shows significant leukocytosis of 25,000, sedimentation rate of 90, temperature 100.9 and a arthrocentesis  sample consistent with an inflammatory or early infectious arthritis. I discussed the care with the orthopedist on call for the orthopedist that the patient saw in the hospital in November. He will provide consultation this morning, requests that I hold antibiotics at this time pending possible surgical evaluation. We'll also keep patient n.p.o. and consult with the hospitalist for admission.  Discussed with hospitalist,  Will admit   Meds given in ED:  Medications  acetaminophen (TYLENOL) tablet 650 mg (650 mg Oral Given 08/15/13 0142)  lidocaine-EPINEPHrine (XYLOCAINE W/EPI) 1 %-1:100000 (with pres) injection 10 mL (10 mLs Other Given 08/15/13 0315)    New Prescriptions   No medications on file      Johnna Acosta, MD 08/15/13 0700

## 2013-08-16 DIAGNOSIS — R5381 Other malaise: Secondary | ICD-10-CM

## 2013-08-16 DIAGNOSIS — D72829 Elevated white blood cell count, unspecified: Secondary | ICD-10-CM

## 2013-08-16 LAB — GLUCOSE, CAPILLARY
GLUCOSE-CAPILLARY: 263 mg/dL — AB (ref 70–99)
GLUCOSE-CAPILLARY: 194 mg/dL — AB (ref 70–99)
GLUCOSE-CAPILLARY: 193 mg/dL — AB (ref 70–99)
Glucose-Capillary: 380 mg/dL — ABNORMAL HIGH (ref 70–99)
Glucose-Capillary: 233 mg/dL — ABNORMAL HIGH (ref 70–99)

## 2013-08-16 LAB — BASIC METABOLIC PANEL
BUN: 48 mg/dL — ABNORMAL HIGH (ref 6–23)
CALCIUM: 9 mg/dL (ref 8.4–10.5)
CO2: 22 meq/L (ref 19–32)
CREATININE: 1.42 mg/dL — AB (ref 0.50–1.10)
Chloride: 99 mEq/L (ref 96–112)
GFR calc non Af Amer: 37 mL/min — ABNORMAL LOW (ref >90)
GFR, EST AFRICAN AMERICAN: 43 mL/min — AB (ref >90)
Glucose, Bld: 229 mg/dL — ABNORMAL HIGH (ref 70–99)
Potassium: 4.5 mEq/L (ref 3.7–5.3)
SODIUM: 137 meq/L (ref 137–147)

## 2013-08-16 LAB — CBC
HCT: 33.8 % — ABNORMAL LOW (ref 36.0–46.0)
Hemoglobin: 12.2 g/dL (ref 12.0–15.0)
MCH: 29.3 pg (ref 26.0–34.0)
MCHC: 36.1 g/dL — ABNORMAL HIGH (ref 30.0–36.0)
MCV: 81.1 fL (ref 78.0–100.0)
PLATELETS: 391 10*3/uL (ref 150–400)
RBC: 4.17 MIL/uL (ref 3.87–5.11)
RDW: 14.1 % (ref 11.5–15.5)
WBC: 16 10*3/uL — AB (ref 4.0–10.5)

## 2013-08-16 MED ORDER — INSULIN ASPART 100 UNIT/ML ~~LOC~~ SOLN
0.0000 [IU] | Freq: Three times a day (TID) | SUBCUTANEOUS | Status: DC
  Administered 2013-08-16: 4 [IU] via SUBCUTANEOUS
  Administered 2013-08-16: 20 [IU] via SUBCUTANEOUS
  Administered 2013-08-17: 4 [IU] via SUBCUTANEOUS
  Administered 2013-08-17: 7 [IU] via SUBCUTANEOUS
  Administered 2013-08-18 (×2): 4 [IU] via SUBCUTANEOUS
  Administered 2013-08-18: 3 [IU] via SUBCUTANEOUS

## 2013-08-16 MED ORDER — INSULIN ASPART 100 UNIT/ML ~~LOC~~ SOLN
0.0000 [IU] | Freq: Every day | SUBCUTANEOUS | Status: DC
  Administered 2013-08-16 – 2013-08-17 (×2): 2 [IU] via SUBCUTANEOUS

## 2013-08-16 MED ORDER — ENOXAPARIN SODIUM 30 MG/0.3ML ~~LOC~~ SOLN
30.0000 mg | SUBCUTANEOUS | Status: DC
  Administered 2013-08-17 – 2013-08-18 (×2): 30 mg via SUBCUTANEOUS
  Filled 2013-08-16 (×2): qty 0.3

## 2013-08-16 MED ORDER — SODIUM CHLORIDE 0.9 % IV SOLN
INTRAVENOUS | Status: DC
  Administered 2013-08-16: 22:00:00 via INTRAVENOUS
  Administered 2013-08-16: 75 mL/h via INTRAVENOUS
  Administered 2013-08-17: 12:00:00 via INTRAVENOUS

## 2013-08-16 NOTE — Progress Notes (Signed)
Inpatient Diabetes Program Recommendations  AACE/ADA: New Consensus Statement on Inpatient Glycemic Control (2013)  Target Ranges:  Prepandial:   less than 140 mg/dL      Peak postprandial:   less than 180 mg/dL (1-2 hours)      Critically ill patients:  140 - 180 mg/dL     Results for Sarah Colon, Sarah Colon (MRN [id number]) as of 08/16/2013 14:23  Ref. Range 08/15/2013 09:45 08/15/2013 11:54 08/15/2013 16:34 08/15/2013 21:09  Glucose-Capillary Latest Range: 70-99 mg/dL 130 (H) 140 (H) 391 (H) 217 (H)    Results for Sarah Colon, Sarah Colon (MRN [id number]) as of 08/16/2013 14:23  Ref. Range 08/16/2013 01:01 08/16/2013 06:39 08/16/2013 11:49  Glucose-Capillary Latest Range: 70-99 mg/dL 263 (H) 194 (H) 380 (H)    Results for Sarah Colon, Sarah Colon (MRN [id number]) as of 08/16/2013 14:23  Ref. Range 08/15/2013 01:54  Hemoglobin A1C Latest Range: <5.7 % 7.0 (H)     **A1c OK at 7%.  Decent control at home.  **Having glucose elevations here in hospital.  Noted Lantus 5 units QHS started last night along with Novolog Moderate SSI.   **MD- If patient continues to have glucose elevations while here in hospital, please consider the following recommenddations:  1. Titrate Lantus upward to 8 units QHS 2. Add Novolog meal coverage- Novolog 4 units tid with meals    Will follow. Wyn Quaker RN, MSN, CDE Diabetes Coordinator Inpatient Diabetes Program Team Pager: [phone] (8a-10p)

## 2013-08-16 NOTE — Progress Notes (Signed)
Occupational Therapy Evaluation Patient Details Name: Sarah Colon MRN: [id number] DOB: Oct 27, 1945 Today's Date: 08/16/2013 Time: [id number] OT Time Calculation (min): 43 min  OT Assessment / Plan / Recommendation History of present illness Pt is a 68 y/o female admitted for elbow pain that has been limiting her function for 3 days PTA. Pt unable to ambulate with RW at home due to pain. Pt currently receiving HHPT 3x/week.   Clinical Impression   PTA pt lived at home alone and was independent in ADLs. She received HHPT and possibly HHOT three times a week. Pt would like to return home, however she demonstrates with gross weakness and decreased activity tolerance. Pt required +2 physical assistance, mod assist for sit<>stand transfers and became fatigued after transferring from [facility] in room to recliner. Feel that there are safety concerns if pt returns home. Pt would benefit from skilled acute OT services to increase strength and activity tolerance and education of precautions and energy conservation.    OT Assessment  Patient needs continued OT Services    Follow Up Recommendations  SNF;Supervision/Assistance - 24 hour    Barriers to Discharge Decreased caregiver support    Equipment Recommendations  None recommended by OT       Frequency  Min 2X/week    Precautions / Restrictions Precautions Precautions: Fall Restrictions Weight Bearing Restrictions: No   Pertinent Vitals/Pain No c/o pain.    ADL  Eating/Feeding: Independent Where Assessed - Eating/Feeding: Chair Grooming: Supervision/safety;Set up Where Assessed - Grooming: Supported sitting Upper Body Bathing: Set up;Supervision/safety Where Assessed - Upper Body Bathing: Supported sitting Lower Body Bathing: Moderate assistance Where Assessed - Lower Body Bathing: Supported sitting Toilet Transfer: +2 Total assistance Toilet Transfer: Patient Percentage: 40% Toilet Transfer Method: Squat pivot Toilet Transfer  Equipment: Bedside commode Equipment Used: Gait belt;Rolling walker Transfers/Ambulation Related to ADLs: Pt not experiencing burning sensation in legs when standing any more. However, required +2 physical assist, mod assist level for sit<>stand    OT Diagnosis: Generalized weakness;Acute pain  OT Problem List: Decreased strength;Decreased range of motion;Decreased activity tolerance;Impaired balance (sitting and/or standing);Decreased safety awareness;Decreased knowledge of use of DME or AE;Decreased knowledge of precautions;Pain OT Treatment Interventions: Self-care/ADL training;Therapeutic exercise;Energy conservation;DME and/or AE instruction;Therapeutic activities;Patient/family education;Balance training   OT Goals(Current goals can be found in the care plan section) Acute Rehab OT Goals Patient Stated Goal: To return home OT Goal Formulation: With patient Time For Goal Achievement: 08/23/13 Potential to Achieve Goals: Good  Visit Information  Last OT Received On: 08/16/13 Assistance Needed: +2 History of Present Illness: Pt is a 68 y/o female admitted for elbow pain that has been limiting her function for 3 days PTA. Pt unable to ambulate with RW at home due to pain. Pt currently receiving HHPT 3x/week.       Prior [place] expects to be discharged to:: Private residence Living Arrangements: Alone Available Help at Discharge: Family;Available PRN/intermittently Type of Home: Apartment Home Access: Stairs to enter Entrance Stairs-Number of Steps: 3 Entrance Stairs-Rails: Right Home Layout: One level Home Equipment: Cane - single point;Walker - 2 wheels Prior Function Level of Independence: Independent with assistive device(s) Comments: Been home from SNF for month and a half PTA. HHPT 3x/week currently. Communication Communication: No difficulties Dominant Hand: Right         Vision/Perception Vision - History Patient Visual  Report: No change from baseline   Cognition  Cognition Arousal/Alertness: Awake/alert Behavior During Therapy: WFL for tasks assessed/performed Overall  Cognitive Status: Within Functional Limits for tasks assessed    Extremity/Trunk Assessment Upper Extremity Assessment Upper Extremity Assessment: RUE deficits/detail RUE Deficits / Details: 4-/5 strength in R Bicep, 3/5 strength in R tricep. Mild pain with pronation/supination; unable to tolerate resisted pro/sup due to wrist pain radially. Pt able to flex elow about 90 actively with no increase in range passively due to guarding from pain, and is 10 from full extension of elbow with no increase in range passively due to pain.  Lower Extremity Assessment Lower Extremity Assessment: Defer to PT evaluation (pt no longer experiencing burning sensation in legs) Cervical / Trunk Assessment Cervical / Trunk Assessment: Kyphotic     Mobility Bed Mobility Overal bed mobility: Needs Assistance Bed Mobility: Supine to Sit Rolling: Min assist General bed mobility comments: Assist for LLE movement off bed but able to push up to sit with min guard at shoulders.  Transfers Overall transfer level: Needs assistance Equipment used: Rolling walker (2 wheeled) Transfers: Public house manager;Sit to/from Stand Sit to Stand: +2 physical assistance;Mod assist Squat pivot transfers: +2 safety/equipment;Mod assist           End of Session OT - End of Session Equipment Utilized During Treatment: Gait belt;Rolling walker Activity Tolerance: Patient tolerated treatment well Patient left: in chair;with call bell/phone within reach;with nursing/sitter in room       Juluis Rainier [id number] 08/16/2013, 2:35 PM

## 2013-08-16 NOTE — Progress Notes (Signed)
Subjective:  The patient states she feels better overall, the elbow is nontender per her description. She states her knees are much improved but "a little sore". She was noted to have diarrhea earlier today, potentially related to the colchicine. She is voiding without difficulties. She denies any chest pain, shortness of breath. She denies fever or chills.  Objective: Vital signs in last 24 hours: Temp:  [97.4 F (36.3 C)-98.4 F (36.9 C)] 98.2 F (36.8 C) (03/19 1354) Pulse Rate:  [70-80] 80 (03/19 1354) Resp:  [18-20] 18 (03/19 1354) BP: (121-134)/(60-84) 133/84 mmHg (03/19 1354) SpO2:  [95 %-99 %] 95 % (03/19 1354)  Intake/Output from previous day: 03/18 0701 - 03/19 0700 In: 120 [P.O.:120] Out: 7 [Urine:3; Stool:4] Intake/Output this shift: Total I/O In: 240 [P.O.:240] Out: 2 [Urine:1; Stool:1]   Recent Labs  08/15/13 0154 08/16/13 0630  HGB 11.1* 12.2    Recent Labs  08/15/13 0154 08/16/13 0630  WBC 25.4* 16.0*  RBC 3.78* 4.17  HCT 30.9* 33.8*  PLT 430* 391    Recent Labs  08/15/13 0154 08/16/13 0630  NA 139 137  K 3.9 4.5  CL 97 99  CO2 23 22  BUN 20 48*  CREATININE 1.18* 1.42*  GLUCOSE 170* 229*  CALCIUM 9.4 9.0   No results found for this basename: LABPT, INR,  in the last 72 hours  She is pleasant, alert and oriented x3 Head atraumatic, normocephalic Chest: Clear expansions are noted respirations are nonlabored Right upper extremity: Dressings are removed, the elbow was without any erythema, effusion or edema and there no signs of cellulitis no signs of significant joint effusion. She is nontender with both passive range of motion and active range of motion today Evaluation of the bilateral knees: Compressive dressings are removed, effusions are resolved and she is much less tender today active range of motion and passive range of motion, there is no signs of cellulitis present, there is no warmth or erythema  Assessment/Plan: Overall, the  patient is showing signs of improvement her WBC count is improving, clinically she is improving in regards to pain. At this juncture we recommend continuing a medical approach to an acute gouty flareup, however one cannot rule out another source of potential infection. She does not appear to have any significant infectious process about the elbow or bilateral knees but more of a gouty exacerbation. Synovial aspiration did show monosodium urate crystals. Currently there is no significant indication for surgical intervention. Of course await final cultures to ensure there is no infectious process in regards to right elbow and bilateral knee aspirates. Patient Active Problem List   Diagnosis Date Noted  . Septic arthritis 08/15/2013  . Fever 08/15/2013  . SVT (supraventricular tachycardia) 08/15/2013  . Hypokalemia 05/09/2013  . Physical deconditioning 04/18/2013  . Chronic pain of left knee   . Gout   . Hypertension   . Unspecified constipation 04/14/2013  . Leukocytosis, unspecified 04/13/2013  . Acute on chronic diastolic heart failure [id number]  . Chronic diastolic heart failure [id number]  . Fatty liver 04/11/2013  . Transaminitis 04/11/2013  . Acute gouty arthritis 04/10/2013  . 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 Knee pain 04/08/2013  . Sepsis 04/08/2013  . Septic arthritis of knee 04/08/2013      Ishi Danser L 08/16/2013, 5:37 PM

## 2013-08-16 NOTE — Progress Notes (Signed)
Patient does not want to go to SNF. CSW asked many questions regarding the safety of the home but the patient is adamant that she wants to go home with [facility]. CSW made CM aware.  Clinical Social Worker will sign off for now as social work intervention is no longer needed. Please consult [place] again if new need arises.    Rhea Pink, MSW, [place]

## 2013-08-16 NOTE — Clinical Social Work Placement (Signed)
Clinical Social Work Department  CLINICAL SOCIAL WORK PLACEMENT NOTE  08/16/2013  Patient: Sarah Colon Account Number: [id number]  Admit date: 08/14/2013  Clinical Social Worker: Adair Laundry Date/time: 08/16/2013 02:30 PM  Clinical Social Work is seeking post-discharge placement for this patient at the following level of care: SKILLED NURSING (*CSW will update this form in Epic as items are completed)  08/16/2013 Patient/family provided with [place] Department of Clinical Social Work's list of facilities offering this level of care within the geographic area requested by the patient (or if unable, by the patient's family).  08/16/2013 Patient/family informed of their freedom to choose among providers that offer the needed level of care, that participate in Medicare, Medicaid or managed care program needed by the patient, have an available bed and are willing to accept the patient.  08/16/2013 Patient/family informed of [facility]' ownership interest in [facility], as well as of the fact that they are under no obligation to receive care at this facility.  PASARR submitted to EDS on  PASARR number received from [place] on  FL2 transmitted to all facilities in geographic area requested by pt/family on 08/16/2013  FL2 transmitted to all facilities within larger geographic area on  Patient informed that his/her managed care company has contracts with or will negotiate with certain facilities, including the following:  Patient/family informed of bed offers received:  Patient chooses bed at  Physician recommends and patient chooses bed at  Patient to be transferred to on  Patient to be transferred to facility by  The following physician request were entered in Epic:  Additional Comments:

## 2013-08-16 NOTE — Progress Notes (Addendum)
PROGRESS NOTE  Sarah Colon [id number] DOB: 1946-04-22 DOA: 08/14/2013 PCP: Salena Saner., MD  Assessment/Plan: Right elbow pain: appears to be gout- s/p arthrocentesis- await cultures- gram stain -seen by ortho -similar to previous admission -b/l knees tapped as well Pain better   AKI -IVF today  Diarrhea -d/c colchicine  Fever- hold on abx until seen by ortho   Leukocytosis- monitor- holding abx until seen by ortho   Gout h/o   DM- continue metformin, SSI   HTN- resume home meds   SVT- resolved  Does not want to go to SNF- would prefer H/H  Code Status: full Family Communication: patient Disposition Plan: refusing   Consultants:  ortho  Procedures:    Antibiotics:    HPI/Subjective: Patient denies diarrhea No sOB, pain better   Objective: Filed Vitals:   08/16/13 0943  BP: 134/74  Pulse: 70  Temp: 97.4 F (36.3 C)  Resp: 18    Intake/Output Summary (Last 24 hours) at 08/16/13 0957 Last data filed at 08/16/13 0421  Gross per 24 hour  Intake    120 ml  Output      7 ml  Net    113 ml   Filed Weights   08/15/13 0002  Weight: 57.153 kg (126 lb)    Exam:   General:  A+Ox3, NAD  Cardiovascular: rrr  Respiratory: clear  Abdomen: +BS, soft  Musculoskeletal: moves all 4 ext   Data Reviewed: Basic Metabolic Panel:  Recent Labs Lab 08/15/13 0154 08/16/13 0630  NA 139 137  K 3.9 4.5  CL 97 99  CO2 23 22  GLUCOSE 170* 229*  BUN 20 48*  CREATININE 1.18* 1.42*  CALCIUM 9.4 9.0   Liver Function Tests: No results found for this basename: AST, ALT, ALKPHOS, BILITOT, PROT, ALBUMIN,  in the last 168 hours No results found for this basename: LIPASE, AMYLASE,  in the last 168 hours No results found for this basename: AMMONIA,  in the last 168 hours CBC:  Recent Labs Lab 08/15/13 0154 08/16/13 0630  WBC 25.4* 16.0*  NEUTROABS 21.1*  --   HGB 11.1* 12.2  HCT 30.9* 33.8*  MCV 81.7 81.1  PLT 430* 391    Cardiac Enzymes: No results found for this basename: CKTOTAL, CKMB, CKMBINDEX, TROPONINI,  in the last 168 hours BNP (last 3 results)  Recent Labs  04/12/13 1822 04/14/13 0540  PROBNP 1377.0* 671.6*   CBG:  Recent Labs Lab 08/15/13 1154 08/15/13 1634 08/15/13 2109 08/16/13 0101 08/16/13 0639  GLUCAP 140* 391* 217* 263* 194*    Recent Results (from the past 240 hour(s))  BODY FLUID CULTURE     Status: None   Collection Time    08/15/13  3:40 AM      Result Value Ref Range Status   Specimen Description FLUID SYNOVIAL RIGHT ARM   Final   Special Requests ELBOW   Final   Gram Stain     Final   Value: ABUNDANT WBC PRESENT, PREDOMINANTLY PMN     NO ORGANISMS SEEN     Performed at [facility]   Culture PENDING   Incomplete   Report Status PENDING   Incomplete  ANAEROBIC CULTURE     Status: None   Collection Time    08/15/13  3:40 AM      Result Value Ref Range Status   Specimen Description FLUID SYNOVIAL RIGHT ARM   Final   Special Requests Normal RIGHT ELBOW   Final   Gram Stain  Final   Value: ABUNDANT WBC PRESENT, PREDOMINANTLY PMN     NO ORGANISMS SEEN     Performed at [facility]   Culture PENDING   Incomplete   Report Status PENDING   Incomplete  GRAM STAIN     Status: None   Collection Time    08/15/13  3:41 AM      Result Value Ref Range Status   Specimen Description FLUID SYNOVIAL RIGHT ARM   Final   Special Requests ELBOW   Final   Gram Stain     Final   Value: NO ORGANISMS SEEN     ABUNDANT WBC PRESENT, PREDOMINANTLY PMN     Results Called to: Jolee Ewing [id number] [phone] [place]   Report Status 08/15/2013 FINAL   Final  ANAEROBIC CULTURE     Status: None   Collection Time    08/15/13  1:27 PM      Result Value Ref Range Status   Specimen Description SYNOVIAL FLUID RIGHT KNEE   Final   Special Requests FLUID ON SWAB   Final   Gram Stain     Final   Value: MODERATE WBC PRESENT,BOTH PMN AND MONONUCLEAR     NO SQUAMOUS EPITHELIAL  CELLS SEEN     NO ORGANISMS SEEN     Performed at [facility]   Culture PENDING   Incomplete   Report Status PENDING   Incomplete  BODY FLUID CULTURE     Status: None   Collection Time    08/15/13  1:28 PM      Result Value Ref Range Status   Specimen Description SYNOVIAL FLUID RIGHT KNEE   Final   Special Requests FLUID ON KNEE   Final   Gram Stain     Final   Value: WBC PRESENT,BOTH PMN AND MONONUCLEAR     NO ORGANISMS SEEN     Performed at [facility]   Culture PENDING   Incomplete   Report Status PENDING   Incomplete  ANAEROBIC CULTURE     Status: None   Collection Time    08/15/13  1:28 PM      Result Value Ref Range Status   Specimen Description SYNOVIAL FLUID KNEE LEFT   Final   Special Requests FLUID ON SWAB   Final   Gram Stain     Final   Value: MODERATE WBC PRESENT,BOTH PMN AND MONONUCLEAR     NO SQUAMOUS EPITHELIAL CELLS SEEN     NO ORGANISMS SEEN     Performed at [facility]   Culture PENDING   Incomplete   Report Status PENDING   Incomplete  BODY FLUID CULTURE     Status: None   Collection Time    08/15/13  1:29 PM      Result Value Ref Range Status   Specimen Description SYNOVIAL FLUID KNEE LEFT   Final   Special Requests FLUID ON SWAB   Final   Gram Stain     Final   Value: WBC PRESENT,BOTH PMN AND MONONUCLEAR     NO ORGANISMS SEEN     Performed at [facility]   Culture PENDING   Incomplete   Report Status PENDING   Incomplete     Studies: Dg Elbow Complete Right  08/15/2013   CLINICAL DATA:  No injury, chronic pain  EXAM: RIGHT ELBOW - COMPLETE 3+ VIEW  COMPARISON:  None.  FINDINGS: Four views of right elbow submitted. No acute fracture or subluxation. Degenerative changes  are noted with spurring of distal humerus and proximal ulna. Spurring of radial head. No posterior fat pad sign.  IMPRESSION: No acute fracture or subluxation. Degenerative changes as described above.   Electronically Signed   By: Lahoma Crocker M.D.    On: 08/15/2013 15:53   Dg Knee Complete 4 Views Left  08/15/2013   CLINICAL DATA:  Bilateral knee pain, no injury  EXAM: LEFT KNEE - COMPLETE 4+ VIEW  COMPARISON:  DG KNEE COMPLETE 4 VIEWS*L* dated 04/08/2013  FINDINGS: Mild narrowing of the medial compartment. No fracture or dislocation. A few tiny loose bodies are noted. 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 Moderate joint effusion.  IMPRESSION: Arthritis.  Joint effusion.   Electronically Signed   By: Skipper Cliche M.D.   On: 08/15/2013 16:26   Dg Knee Complete 4 Views Right  08/15/2013   CLINICAL DATA:  Chronic bilateral knee pain.  EXAM: RIGHT KNEE - COMPLETE 4+ VIEW  COMPARISON:  04/08/2013  FINDINGS: Mild joint space narrowing and spurring, most pronounced in the patellofemoral compartment. Findings are similar to prior study. No fracture, subluxation or dislocation. There is a moderate joint effusion, also likely stable.  IMPRESSION: Mild to moderate degenerative changes as above with moderate joint effusion. Findings similar to prior study.   Electronically Signed   By: Rolm Baptise M.D.   On: 08/15/2013 17:06    Scheduled Meds: . amLODipine  5 mg Oral Daily  . benazepril  40 mg Oral Daily  . brimonidine  1 drop Both Eyes TID  . bupivacaine  10 mL Infiltration Once  . dorzolamide-timolol  1 drop Both Eyes BID  . enoxaparin (LOVENOX) injection  40 mg Subcutaneous Q24H  . feeding supplement (GLUCERNA SHAKE)  237 mL Oral TID BM  . gabapentin  600 mg Oral TID  . insulin aspart  0-20 Units Subcutaneous TID WC  . insulin aspart  0-5 Units Subcutaneous QHS  . insulin glargine  5 Units Subcutaneous QHS  . latanoprost  1 drop Both Eyes QHS  . naproxen  500 mg Oral BID WC   Continuous Infusions: . sodium chloride 75 mL/hr (08/16/13 0951)   Antibiotics Given (last 72 hours)   None      Active Problems:   Type 2 diabetes mellitus with diabetic neuropathic arthropathy   Leukocytosis, unspecified   Gout   Hypertension   Septic  arthritis   Fever   SVT (supraventricular tachycardia)    Time spent: 35 min    VANN, JESSICA  [facility] Pager [phone]. If 7PM-7AM, please contact night-coverage at [email or web address], password [facility] 08/16/2013, 9:57 AM  LOS: 2 days

## 2013-08-17 DIAGNOSIS — M109 Gout, unspecified: Principal | ICD-10-CM

## 2013-08-17 DIAGNOSIS — E876 Hypokalemia: Secondary | ICD-10-CM

## 2013-08-17 LAB — CBC WITH DIFFERENTIAL/PLATELET
BASOS ABS: 0 10*3/uL (ref 0.0–0.1)
Basophils Relative: 0 % (ref 0–1)
Eosinophils Absolute: 0 10*3/uL (ref 0.0–0.7)
Eosinophils Relative: 0 % (ref 0–5)
HCT: 33.2 % — ABNORMAL LOW (ref 36.0–46.0)
Hemoglobin: 12 g/dL (ref 12.0–15.0)
LYMPHS ABS: 2.7 10*3/uL (ref 0.7–4.0)
LYMPHS PCT: 18 % (ref 12–46)
MCH: 29.4 pg (ref 26.0–34.0)
MCHC: 36.1 g/dL — ABNORMAL HIGH (ref 30.0–36.0)
MCV: 81.4 fL (ref 78.0–100.0)
Monocytes Absolute: 1.4 10*3/uL — ABNORMAL HIGH (ref 0.1–1.0)
Monocytes Relative: 9 % (ref 3–12)
NEUTROS ABS: 11.2 10*3/uL — AB (ref 1.7–7.7)
Neutrophils Relative %: 73 % (ref 43–77)
PLATELETS: 450 10*3/uL — AB (ref 150–400)
RBC: 4.08 MIL/uL (ref 3.87–5.11)
RDW: 14.4 % (ref 11.5–15.5)
WBC: 15.4 10*3/uL — AB (ref 4.0–10.5)

## 2013-08-17 LAB — GI PATHOGEN PANEL BY PCR, STOOL
C DIFFICILE TOXIN A/B: NEGATIVE
CAMPYLOBACTER BY PCR: NEGATIVE
CRYPTOSPORIDIUM BY PCR: NEGATIVE
E COLI (ETEC) LT/ST: NEGATIVE
E COLI (STEC): NEGATIVE
E coli 0157 by PCR: NEGATIVE
G lamblia by PCR: NEGATIVE
NOROVIRUS G1/G2: NEGATIVE
ROTAVIRUS A BY PCR: NEGATIVE
Salmonella by PCR: NEGATIVE
Shigella by PCR: NEGATIVE

## 2013-08-17 LAB — GLUCOSE, CAPILLARY
GLUCOSE-CAPILLARY: 167 mg/dL — AB (ref 70–99)
Glucose-Capillary: 175 mg/dL — ABNORMAL HIGH (ref 70–99)
Glucose-Capillary: 201 mg/dL — ABNORMAL HIGH (ref 70–99)
Glucose-Capillary: 236 mg/dL — ABNORMAL HIGH (ref 70–99)

## 2013-08-17 MED ORDER — CALCIUM CARBONATE ANTACID 500 MG PO CHEW
1.0000 | CHEWABLE_TABLET | Freq: Four times a day (QID) | ORAL | Status: DC | PRN
  Administered 2013-08-17: 200 mg via ORAL
  Filled 2013-08-17: qty 1

## 2013-08-17 MED ORDER — INSULIN GLARGINE 100 UNIT/ML ~~LOC~~ SOLN
8.0000 [IU] | Freq: Every day | SUBCUTANEOUS | Status: DC
  Administered 2013-08-17: 8 [IU] via SUBCUTANEOUS
  Filled 2013-08-17 (×2): qty 0.08

## 2013-08-17 MED ORDER — COLCHICINE 0.6 MG PO TABS
0.6000 mg | ORAL_TABLET | Freq: Every day | ORAL | Status: DC
  Administered 2013-08-17 – 2013-08-18 (×2): 0.6 mg via ORAL
  Filled 2013-08-17 (×2): qty 1

## 2013-08-17 NOTE — Progress Notes (Signed)
Occupational Therapy Treatment Patient Details Name: Sarah Colon MRN: [id number] DOB: 09-20-1945 Today's Date: 08/17/2013 Time: 1430-1501 OT Time Calculation (min): 31 min  OT Assessment / Plan / Recommendation  History of present illness Pt is a 68 y/o female admitted for elbow pain that has been limiting her function for 3 days PTA. Pt unable to ambulate with RW at home due to pain. Pt currently receiving HHPT 3x/week.   OT comments  Pt seen today for ADL session to increase activity tolerance and endurance. Pt adamant about not returning to SNF and wishes to return home. Educated pt on safety concerns with pt returning home alone and discussed ways to increase safety. Pt practiced toilet transfer and standing endurance for grooming at sink. Pt with improved sitting balance and is supervision for sit>supine bed mobility after encouraging pt to treat session as though she was home alone. Still feel that pt would benefit from SNF and 24 hour supervision for maximum safety, however OT will continue to address activity tolerance with regards to ADLs to maximize safety when pt returns home.    Follow Up Recommendations  SNF;Supervision/Assistance - 24 hour    Barriers to Discharge   Decreased caregiver support (pt lives alone)    Equipment Recommendations  None recommended by OT       Frequency Min 2X/week   Progress towards OT Goals Progress towards OT goals: Goals met and updated - see care plan  Plan Discharge plan remains appropriate    Precautions / Restrictions Precautions Precautions: Fall Restrictions Weight Bearing Restrictions: No   Pertinent Vitals/Pain No c/o pain    ADL  Grooming: Supervision/safety;Set up Where Assessed - Grooming: Unsupported sitting Lower Body Dressing: Minimal assistance Where Assessed - Lower Body Dressing: Supported sit to stand Toilet Transfer: Magazine features editor Method: Sit to Loss adjuster, chartered: Other (comment)  (sit<>stand from bed) Transfers/Ambulation Related to ADLs: Pt with improved sitting balance and bed mobility. Encouraged to perform bed mobility with supervision as pt is adamant about returning home and therapy focused on preparing pt to be safe at home.  ADL Comments: Pt is supervision for unsupported sitting ADLs. Able to cross legs over to reach feet for LB dressing, however requires min A for LB dressing due to assist needed for sit>stand to pull up pants.      OT Goals(current goals can now be found in the care plan section) Acute Rehab OT Goals Patient Stated Goal: To return home ADL Goals Pt Will Perform Lower Body Bathing: with supervision;sit to/from stand Pt Will Perform Lower Body Dressing: with supervision;sit to/from stand Pt Will Transfer to Toilet: with supervision;ambulating;bedside commode Pt Will Perform Toileting - Clothing Manipulation and hygiene: with supervision;sit to/from stand  Visit Information  Last OT Received On: 08/17/13 Assistance Needed: +1 History of Present Illness: Pt is a 68 y/o female admitted for elbow pain that has been limiting her function for 3 days PTA. Pt unable to ambulate with RW at home due to pain. Pt currently receiving HHPT 3x/week.          Cognition  Cognition Arousal/Alertness: Awake/alert Behavior During Therapy: WFL for tasks assessed/performed Overall Cognitive Status: Within Functional Limits for tasks assessed    Mobility  Bed Mobility Overal bed mobility: Needs Assistance Bed Mobility: Sit to Supine Sit to supine: Min guard (pt encouraged to perform as though at home alone) General bed mobility comments: Pt with improved sit>supine bed mobility after discussing pt's desire to return home without assistance.  Pt able to move legs onto bed and was min G for bed mobility.  Transfers Overall transfer level: Needs assistance Equipment used: Rolling walker (2 wheeled) Transfers: Sit to/from Stand Sit to Stand: Min  guard General transfer comment: VC's for hand placement on seated surface for safety. Pt able to power-up to full stand with no physical assist required.           End of Session OT - End of Session Equipment Utilized During Treatment: Gait belt;Rolling walker Activity Tolerance: Patient tolerated treatment well Patient left: in bed;with call bell/phone within reach       Juluis Rainier [phone] 08/17/2013, 3:13 PM

## 2013-08-17 NOTE — Progress Notes (Signed)
Results for MAURICA, BELZ (MRN [id number]) as of 08/17/2013 13:37  Ref. Range 08/16/2013 16:44 08/16/2013 22:03 08/17/2013 06:49 08/17/2013 11:19  Glucose-Capillary Latest Range: 70-99 mg/dL 193 (H) 233 (H) 175 (H) 201 (H)   Recommend increasing Lantus does to 8 units daily.  If postprandial blood sugars continue to be greater than 180 mg/dl, consider adding Novolog 4 units TID with meals.  Will follow.  Harvel Ricks RN BSN CDE

## 2013-08-17 NOTE — Progress Notes (Signed)
Patient ID: Sarah Colon, female   DOB: May 20, 1946, 68 y.o.   MRN: [id number] Patient seen at bedside.  Patient is doing well.  I discussed with her the fact that she does have gout and she had this in the past. She is a bit uneducated. I've asked for her to discuss this with her primary care physician at next visit.  Her knees and elbow are all doing well without complicating features to date.  I am pleased with her response to the treatment for gout including aspiration in the knees and observatory care of the elbows. I do not see any signs of septic joint.  Her x-rays do show arthritis.  I would continue algorithms for gout treatment and have her followup with her primary care physician.  We will sign off for now please call for any problems  Emilina Smarr M.D.

## 2013-08-17 NOTE — Progress Notes (Signed)
Physical Therapy Treatment Patient Details Name: Sarah Colon MRN: [id number] DOB: [birth year] Today's Date: 08/17/2013 Time: [id number] PT Time Calculation (min): 26 min  PT Assessment / Plan / Recommendation  History of Present Illness Pt is a 68 y/o female admitted for elbow pain that has been limiting her function for 3 days PTA. Pt unable to ambulate with RW at home due to pain. Pt currently receiving HHPT 3x/week.   PT Comments   Pt progressing towards physical therapy goals. Continues to be limited by decreased tolerance for functional activity as well as pain, however shows improvement from previous session. Continue to feel that pt would benefit from post-acute rehab for safety and continued strengthening, however pt adamantly refusing. If pt discharges home, will need stair training and 24 hour assist at least the first few days. Stair training has not been initiated yet as pt cannot tolerate due to fatigue and knee pain. Will continue to follow.  Follow Up Recommendations  SNF     Does the patient have the potential to tolerate intense rehabilitation     Barriers to Discharge        Equipment Recommendations  3in1 (PT), RW   Recommendations for Other Services    Frequency Min 3X/week   Progress towards PT Goals Progress towards PT goals: Progressing toward goals  Plan Current plan remains appropriate    Precautions / Restrictions Precautions Precautions: Fall Restrictions Weight Bearing Restrictions: No   Pertinent Vitals/Pain Pt reports 8/10 pain in bilateral knees after ambulation. Heat packs issued and RN notified.     Mobility  Bed Mobility Overal bed mobility: Needs Assistance Bed Mobility: Supine to Sit Supine to sit: Min assist General bed mobility comments: Pt able to transfer to S/L position with LE's off bed, requiring only min assist for trunk elevation to transition to full sitting on EOB.  Transfers Overall transfer level: Needs  assistance Equipment used: Rolling walker (2 wheeled) Transfers: Sit to/from Stand Sit to Stand: Min guard General transfer comment: VC's for hand placement on seated surface for safety. Pt able to power-up to full stand with no physical assist required.  Ambulation/Gait Ambulation/Gait assistance: Min guard Ambulation Distance (Feet): 25 Feet Assistive device: Rolling walker (2 wheeled) Gait Pattern/deviations: Step-through pattern;Decreased stride length;Trunk flexed Gait velocity: Decreased Gait velocity interpretation: Below normal speed for age/gender General Gait Details: VC's for sequencing and safety awareness with the RW. Pt fatigues very quickly and complains of knee pain which she states is limiting her.     Exercises     PT Diagnosis:    PT Problem List:   PT Treatment Interventions:     PT Goals (current goals can now be found in the care plan section) Acute Rehab PT Goals Patient Stated Goal: To return home PT Goal Formulation: With patient Time For Goal Achievement: 08/29/13 Potential to Achieve Goals: Good  Visit Information  Last PT Received On: 08/17/13 Assistance Needed: +2 (chair follow) History of Present Illness: Pt is a 68 y/o female admitted for elbow pain that has been limiting her function for 3 days PTA. Pt unable to ambulate with RW at home due to pain. Pt currently receiving HHPT 3x/week.    Subjective Data  Subjective: "I am NOT going to rehab. I will be going home and I like to be by myself." Patient Stated Goal: To return home   Cognition  Cognition Arousal/Alertness: Awake/alert Behavior During Therapy: WFL for tasks assessed/performed Overall Cognitive Status: Within Functional Limits for tasks assessed  Balance  Balance Overall balance assessment: Needs assistance Sitting-balance support: Feet supported Sitting balance-Leahy Scale: Fair Standing balance support: Bilateral upper extremity supported Standing balance-Leahy Scale:  Fair General Comments General comments (skin integrity, edema, etc.): Therapeutic exercise deferred due to pt fatigue and pain in bilateral knees. PT issued heat packs for knees and alerted RN of status of pills that pt refused to take from earlier in the day.   End of Session PT - End of Session Equipment Utilized During Treatment: Gait belt Activity Tolerance: Patient limited by fatigue;Patient limited by pain Patient left: in chair;with call bell/phone within reach Nurse Communication: Mobility status   GP     Jolyn Lent 08/17/2013, 11:58 AM  Jolyn Lent, PT, DPT Acute Rehabilitation Services Pager: [phone]

## 2013-08-17 NOTE — Care Management Note (Signed)
CARE MANAGEMENT NOTE 08/17/2013  Patient:  Sarah Colon, Sarah Colon   Account Number:  [id number]  Date Initiated:  08/17/2013  Documentation initiated by:  Ricki Miller  Subjective/Objective Assessment:   68 yr old female s/p synovial aspiration of right elbow and bilateral knees.     Action/Plan:   Case manager discussed home health and DME needs at discharge. Her daughter will assist as needed.   Anticipated DC Date:  08/17/2013   Anticipated DC Plan:  [place]  CM consult      [facility] Choice  Resumption Of Svcs/PTA Provider   Choice offered to / List presented to:  C-1 Patient   DME arranged  Vassie Moselle      DME agency  [place] arranged  [place]   Status of service:  Completed, signed off Medicare Important Message given?   (If response is "NO", the following Medicare IM given date fields will be blank) Date Medicare IM given:   Date Additional Medicare IM given:    Discharge Disposition:  [place]

## 2013-08-17 NOTE — Progress Notes (Signed)
Chart reviewed.   PROGRESS NOTE  Sarah Colon [id number] DOB: [birth year] DOA: 08/14/2013 PCP: Salena Saner., MD  Assessment/Plan: Right elbow pain: appears to be gout- s/p arthrocentesis- await cultures- gram stain -seen by ortho -similar to previous admission -b/l knees tapped as well Pain better   AKI Saline lock. D/C nsaids  Diarrhea resolved  Gout h/o   DM- continue metformin, SSI   HTN- controlled  SVT- resolved  Home tomorrow with home PT after works with PT on stairs  Code Status: full Family Communication: patient Disposition Plan: refusingSNF   Consultants:  ortho  Procedures:    Antibiotics:    HPI/Subjective: Patient denies diarrhea No sOB, pain better   Objective: Filed Vitals:   08/17/13 1500  BP: 138/70  Pulse: 63  Temp: 98.3 F (36.8 C)  Resp: 18    Intake/Output Summary (Last 24 hours) at 08/17/13 1716 Last data filed at 08/17/13 1300  Gross per 24 hour  Intake 2036.25 ml  Output      0 ml  Net 2036.25 ml   Filed Weights   08/15/13 0002  Weight: 57.153 kg (126 lb)    Exam:   General:  A+Ox3, NAD  Cardiovascular: rrr  Respiratory: clear  Abdomen: +BS, soft  Musculoskeletal: moves all 4 ext   Data Reviewed: Basic Metabolic Panel:  Recent Labs Lab 08/15/13 0154 08/16/13 0630  NA 139 137  K 3.9 4.5  CL 97 99  CO2 23 22  GLUCOSE 170* 229*  BUN 20 48*  CREATININE 1.18* 1.42*  CALCIUM 9.4 9.0   Liver Function Tests: No results found for this basename: AST, ALT, ALKPHOS, BILITOT, PROT, ALBUMIN,  in the last 168 hours No results found for this basename: LIPASE, AMYLASE,  in the last 168 hours No results found for this basename: AMMONIA,  in the last 168 hours CBC:  Recent Labs Lab 08/15/13 0154 08/16/13 0630 08/17/13 1649  WBC 25.4* 16.0* 15.4*  NEUTROABS 21.1*  --  11.2*  HGB 11.1* 12.2 12.0  HCT 30.9* 33.8* 33.2*  MCV 81.7 81.1 81.4  PLT 430* 391 450*   Cardiac Enzymes: No  results found for this basename: CKTOTAL, CKMB, CKMBINDEX, TROPONINI,  in the last 168 hours BNP (last 3 results)  Recent Labs  04/12/13 1822 04/14/13 0540  PROBNP 1377.0* 671.6*   CBG:  Recent Labs Lab 08/16/13 1644 08/16/13 2203 08/17/13 0649 08/17/13 1119 08/17/13 1648  GLUCAP 193* 233* 175* 201* 167*    Recent Results (from the past 240 hour(s))  BODY FLUID CULTURE     Status: None   Collection Time    08/15/13  3:40 AM      Result Value Ref Range Status   Specimen Description FLUID SYNOVIAL RIGHT ARM   Final   Special Requests ELBOW   Final   Gram Stain     Final   Value: ABUNDANT WBC PRESENT, PREDOMINANTLY PMN     NO ORGANISMS SEEN     Performed at [facility]   Culture     Final   Value: NO GROWTH 2 DAYS     Performed at [facility]   Report Status PENDING   Incomplete  ANAEROBIC CULTURE     Status: None   Collection Time    08/15/13  3:40 AM      Result Value Ref Range Status   Specimen Description FLUID SYNOVIAL RIGHT ARM   Final   Special Requests Normal RIGHT ELBOW   Final  Gram Stain     Final   Value: ABUNDANT WBC PRESENT, PREDOMINANTLY PMN     NO ORGANISMS SEEN     Performed at [facility]   Culture     Final   Value: NO ANAEROBES ISOLATED; CULTURE IN PROGRESS FOR 5 DAYS     Performed at [facility]   Report Status PENDING   Incomplete  GRAM STAIN     Status: None   Collection Time    08/15/13  3:41 AM      Result Value Ref Range Status   Specimen Description FLUID SYNOVIAL RIGHT ARM   Final   Special Requests ELBOW   Final   Gram Stain     Final   Value: NO ORGANISMS SEEN     ABUNDANT WBC PRESENT, PREDOMINANTLY PMN     Results Called to: Jolee Ewing [id number] [phone] [place]   Report Status 08/15/2013 FINAL   Final  ANAEROBIC CULTURE     Status: None   Collection Time    08/15/13  1:27 PM      Result Value Ref Range Status   Specimen Description SYNOVIAL FLUID RIGHT KNEE   Final   Special Requests FLUID ON  SWAB   Final   Gram Stain     Final   Value: MODERATE WBC PRESENT,BOTH PMN AND MONONUCLEAR     NO SQUAMOUS EPITHELIAL CELLS SEEN     NO ORGANISMS SEEN     Performed at [facility]   Culture     Final   Value: NO ANAEROBES ISOLATED; CULTURE IN PROGRESS FOR 5 DAYS     Performed at [facility]   Report Status PENDING   Incomplete  BODY FLUID CULTURE     Status: None   Collection Time    08/15/13  1:28 PM      Result Value Ref Range Status   Specimen Description SYNOVIAL FLUID RIGHT KNEE   Final   Special Requests FLUID ON KNEE   Final   Gram Stain     Final   Value: WBC PRESENT,BOTH PMN AND MONONUCLEAR     NO ORGANISMS SEEN     Performed at [facility]   Culture     Final   Value: NO GROWTH 2 DAYS     Performed at [facility]   Report Status PENDING   Incomplete  ANAEROBIC CULTURE     Status: None   Collection Time    08/15/13  1:28 PM      Result Value Ref Range Status   Specimen Description SYNOVIAL FLUID KNEE LEFT   Final   Special Requests FLUID ON SWAB   Final   Gram Stain     Final   Value: MODERATE WBC PRESENT,BOTH PMN AND MONONUCLEAR     NO SQUAMOUS EPITHELIAL CELLS SEEN     NO ORGANISMS SEEN     Performed at [facility]   Culture     Final   Value: NO ANAEROBES ISOLATED; CULTURE IN PROGRESS FOR 5 DAYS     Performed at [facility]   Report Status PENDING   Incomplete  BODY FLUID CULTURE     Status: None   Collection Time    08/15/13  1:29 PM      Result Value Ref Range Status   Specimen Description SYNOVIAL FLUID KNEE LEFT   Final   Special Requests FLUID ON SWAB   Final   Gram Stain  Final   Value: WBC PRESENT,BOTH PMN AND MONONUCLEAR     NO ORGANISMS SEEN     Performed at [facility]   Culture     Final   Value: NO GROWTH 2 DAYS     Performed at [facility]   Report Status PENDING   Incomplete     Studies: No results found.  Scheduled Meds: . amLODipine  5 mg Oral Daily  .  benazepril  40 mg Oral Daily  . brimonidine  1 drop Both Eyes TID  . bupivacaine  10 mL Infiltration Once  . dorzolamide-timolol  1 drop Both Eyes BID  . enoxaparin (LOVENOX) injection  30 mg Subcutaneous Q24H  . feeding supplement (GLUCERNA SHAKE)  237 mL Oral TID BM  . gabapentin  600 mg Oral TID  . insulin aspart  0-20 Units Subcutaneous TID WC  . insulin aspart  0-5 Units Subcutaneous QHS  . insulin glargine  5 Units Subcutaneous QHS  . latanoprost  1 drop Both Eyes QHS  . naproxen  500 mg Oral BID WC   Continuous Infusions: . sodium chloride 75 mL/hr at 08/17/13 1143   Antibiotics Given (last 72 hours)   None      Active Problems:   Type 2 diabetes mellitus with diabetic neuropathic arthropathy   Leukocytosis, unspecified   Gout   Hypertension   Septic arthritis   Fever   SVT (supraventricular tachycardia)    Time spent: 25 min   [facility] [place] [facility] Pager [phone]. If 7PM-7AM, please contact night-coverage at [email or web address], password [facility] 08/17/2013, 5:16 PM  LOS: 3 days

## 2013-08-18 DIAGNOSIS — E1149 Type 2 diabetes mellitus with other diabetic neurological complication: Secondary | ICD-10-CM

## 2013-08-18 DIAGNOSIS — I498 Other specified cardiac arrhythmias: Secondary | ICD-10-CM

## 2013-08-18 LAB — BASIC METABOLIC PANEL
BUN: 47 mg/dL — ABNORMAL HIGH (ref 6–23)
CO2: 26 meq/L (ref 19–32)
Calcium: 9.7 mg/dL (ref 8.4–10.5)
Chloride: 104 mEq/L (ref 96–112)
Creatinine, Ser: 1.28 mg/dL — ABNORMAL HIGH (ref 0.50–1.10)
GFR calc non Af Amer: 42 mL/min — ABNORMAL LOW (ref >90)
GFR, EST AFRICAN AMERICAN: 48 mL/min — AB (ref >90)
Glucose, Bld: 123 mg/dL — ABNORMAL HIGH (ref 70–99)
POTASSIUM: 4.8 meq/L (ref 3.7–5.3)
Sodium: 142 mEq/L (ref 137–147)

## 2013-08-18 LAB — BODY FLUID CULTURE
CULTURE: NO GROWTH
Culture: NO GROWTH
Culture: NO GROWTH

## 2013-08-18 LAB — CBC
HEMATOCRIT: 33.2 % — AB (ref 36.0–46.0)
HEMOGLOBIN: 11.5 g/dL — AB (ref 12.0–15.0)
MCH: 28.5 pg (ref 26.0–34.0)
MCHC: 34.6 g/dL (ref 30.0–36.0)
MCV: 82.4 fL (ref 78.0–100.0)
Platelets: 484 10*3/uL — ABNORMAL HIGH (ref 150–400)
RBC: 4.03 MIL/uL (ref 3.87–5.11)
RDW: 14.6 % (ref 11.5–15.5)
WBC: 11.5 10*3/uL — AB (ref 4.0–10.5)

## 2013-08-18 LAB — GLUCOSE, CAPILLARY
GLUCOSE-CAPILLARY: 178 mg/dL — AB (ref 70–99)
Glucose-Capillary: 127 mg/dL — ABNORMAL HIGH (ref 70–99)
Glucose-Capillary: 166 mg/dL — ABNORMAL HIGH (ref 70–99)

## 2013-08-18 MED ORDER — MELOXICAM 7.5 MG PO TABS: 7.5000 mg | ORAL_TABLET | Freq: Every day | ORAL | Status: DC | PRN

## 2013-08-18 MED ORDER — COLCHICINE 0.6 MG PO TABS: 0.6000 mg | ORAL_TABLET | Freq: Every day | ORAL | Status: DC

## 2013-08-18 NOTE — Progress Notes (Signed)
Spoke with PTA and agree with plan/recommendation changes.  Barbarann Ehlers [place], [place], DPT [phone]

## 2013-08-18 NOTE — Progress Notes (Signed)
Discharge instructions and prescriptions given to and reviewed with patient. Discussed importance of home health assistance with patient. Patient adamant about not needing help to "get around" and that she does not want strangers in her home. Continued to stress importance of assistance and therapy to prevent possible injury. IV removed. VS stable. Waiting on patients daughter to arrive from [facility] to pick up patient.

## 2013-08-18 NOTE — Progress Notes (Signed)
Physical Therapy Treatment Patient Details Name: Sarah Colon MRN: [id number] DOB: 16-Jun-1945 Today's Date: 08/18/2013 Time: [id number] PT Time Calculation (min): 27 min  PT Assessment / Plan / Recommendation  History of Present Illness Pt is a 68 y/o female admitted for elbow pain that has been limiting her function for 3 days PTA. Pt unable to ambulate with RW at home due to pain. Pt currently receiving HHPT 3x/week.   PT Comments   Pt needing significant amount of assistance with attempt at stair negotiation and with limited gait tolerance today. Discussed with pt safety concerns for discharge home alone due to limited mobility currently and need for assistance to prevent falls. Discussed with Dr. Conley Canal, [facility] and pt's RN today's session and concerns with pt discharging home alone. Pt most appropriate for ST-SNF for continued rehab to maximize strengthening, mobility and independence for safe discharge home alone. Pt adamantly refusing SNF. At this time Dr. Conley Canal said she would discuss situation with pt/daughter and if pt goes home set her up with max home health services if able to minimize as best able the safety concerns. Pt is a high fall risk and if not going to rehab will need 24 hour assistance at home to minimize risk of falls.   Follow Up Recommendations  SNF;Supervision/Assistance - 24 hour (in continues to refuse snf-max HH options)     Equipment Recommendations  3in1 (PT)    Recommendations for Other Services OT consult  Frequency Min 3X/week   Progress towards PT Goals Progress towards PT goals: Progressing toward goals  Plan Current plan remains appropriate    Precautions / Restrictions Precautions Precautions: Fall Restrictions Weight Bearing Restrictions: No       Mobility  Bed Mobility Overal bed mobility: Needs Assistance Bed Mobility: Supine to Sit Supine to sit: HOB elevated;Supervision General bed mobility comments: Needed rail and head elevated to  30 degrees and then able to bring self to edge of bed. Transfers Overall transfer level: Needs assistance Equipment used: Rolling walker (2 wheeled) Transfers: Sit to/from Stand Sit to Stand: Min guard;Min assist General transfer comment: cues on hand placement with standing as pt attempting to pull self up with walker. as session progressed pt needed increased assistance with standing. sit/stand x3 reps for home walker height check, to get to recliner after rest from first stand and  for stair education.                Ambulation/Gait Ambulation/Gait assistance: Min guard Ambulation Distance (Feet): 10 Feet Assistive device: Rolling walker (2 wheeled) Gait Pattern/deviations: Step-through pattern;Decreased stride length;Trunk flexed Gait velocity: Decreased Gait velocity interpretation: Below normal speed for age/gender General Gait Details: cues on posture, walker position and use of walker. pt with bil knee/elbow flexion with gait and cues to correct, however flexion increased as gait progressed, not improved.  Stairs: Yes Stairs assistance: Max assist Stair Management: One rail Right;Step to pattern;Sideways Number of Stairs: 2 General stair comments: pt able to go up 2 stairs with one rail and min-mod assist with cues on technique. at this point pt's knees completely buckling and pt sitting on PTA knee to prevent fall to ground. While sitting on PTA knee and with use of rail and cues pt able to work feet down to first step and then work the right foot to the floor with assist/cues and then total assist from PTA to pivot safely to recliner.  PT Goals (current goals can now be found in the care plan section) Acute Rehab PT Goals Patient Stated Goal: To return home PT Goal Formulation: With patient Time For Goal Achievement: 08/29/13 Potential to Achieve Goals: Good  Visit Information  Last PT Received On: 08/18/13 Assistance Needed: +1 History of  Present Illness: Pt is a 68 y/o female admitted for elbow pain that has been limiting her function for 3 days PTA. Pt unable to ambulate with RW at home due to pain. Pt currently receiving HHPT 3x/week.    Subjective Data  Patient Stated Goal: To return home   Cognition  Cognition Arousal/Alertness: Awake/alert Behavior During Therapy: WFL for tasks assessed/performed Overall Cognitive Status: Within Functional Limits for tasks assessed       End of Session PT - End of Session Equipment Utilized During Treatment: Gait belt Activity Tolerance: Patient limited by pain;Patient limited by fatigue Patient left: in chair;with call bell/phone within reach Nurse Communication: Mobility status;Other (comment);Patient requests pain meds (safety concerns with discharge home alone today)   GP     Sarah Colon 08/18/2013, 1:42 PM  Sarah Colon, PTA Office- [phone]

## 2013-08-18 NOTE — Progress Notes (Signed)
OT Cancellation Note  Patient Details Name: Sarah Colon MRN: [id number] DOB: 1946/02/29   Cancelled Treatment:    Reason Eval/Treat Not Completed: Patient declined, no reason specified;Other (comment)                                            Pt. Declined stating "i have had enough of this therapy stuff, i'll tell you like i told em all i just need to go home and do what i want to do and ill be fine.  My knees hurt and i just want to sit here and watch my westerns, i like watching westerns."  Pt. Also refusing to eat her lunch.  States she is just "tired of everything right now".   Reviewed our goals and explained I would request her wishes.  Will attempt back if pt. Willing.   Janice Coffin, COTA/L 08/18/2013, 1:35 PM

## 2013-08-18 NOTE — Discharge Summary (Signed)
Physician Discharge Summary  Sarah Colon [id number] DOB: Oct 16, 1945 DOA: 08/14/2013  PCP: Salena Saner., MD  Admit date: 08/14/2013 Discharge date: 08/18/2013  Time spent: greater than 30 mintues  Recommendations for Outpatient Follow-up:  1. SNF recommended. Patient refuses. Recommend 24 hour supervision. Will arrange home PT, OT, aide, SW  Discharge Diagnoses:   Acute gouty arthritis, polyarticular   Type 2 diabetes mellitus with diabetic neuropathic arthropathy   Leukocytosis, unspecified   Gout   Hypertension   Fever   SVT (supraventricular tachycardia) Deconditioning aki  Discharge Condition: stable  Filed Weights   08/15/13 0002  Weight: 57.153 kg (126 lb)    History of present illness:  68 y.o. female  recently hospitalized and discharged to a nursing facility where she spent some time in rehabilitation after acute gouty arthritis complicated by her diabetes and congestive heart failure. Pain in right elbow since Saturday. She secondary to pain, has been unable to get out of bed, has not walked in 2 days, has felt dehydrated, has been drinking fluids but not eating. She states that she usually walks with a walker and has chronically weak lower extremities. She has been getting physical therapy at her home until recently  She called EMS, they found patient to be tachycardic with a pulse of approximately 180, she was given adenosine without significant improvement, she denieschest pain, shortness of breath palpitations nausea cough or any other complaints.  In the ER, Labs show WBCs of 25,000, sedimentation rate of 90, temperature 100.9. Tap was done in the ER, and case was discussed with an orthopedist: hold antibiotics, n.p.o. And they will see in consultation.  Her tachycardia resolved spontaneously without meds/valsalva   Hospital Course:  Admitted to hospitalists, telemetry. No further SVT.  Right elbow joint aspirated. Cultures negative. orthoopedics  consulted. Bilateral knee effusions aspirated and joints injected.  Started on colchicine and NSAIDs, as ortho felt symptoms consistent with gout. No evidence of septic arthritis. Pain improved.  Worked with PT, OT and SNF recommended, but patient refuses.  Creatinine increased slightly, normalized after IV hydration and discontinuation of NSAIDs.   Procedures:  Right elbow arthrocentesis. Bilateral knee arthrocentesis and injection  Consultations:  orthopedics  Discharge Exam: Filed Vitals:   08/18/13 0524  BP: 126/66  Pulse: 58  Temp: 98.1 F (36.7 C)  Resp: 16    General: alert, oriented Cardiovascular: RRR Respiratory: CTA Musculoskeletal: knees and elbows without wamth, erythema or tenderness  Discharge Instructions  Discharge Orders   Future Orders Complete By Expires   Diet - low sodium heart healthy  As directed    Diet Carb Modified  As directed    Increase activity slowly  As directed    Walker   As directed        Medication List         acetaminophen 325 MG tablet  Commonly known as:  TYLENOL  Take 2 tablets (650 mg total) by mouth every 6 (six) hours as needed for mild pain (or Fever >/= 101).     amLODipine 10 MG tablet  Commonly known as:  NORVASC  Take 5 mg by mouth daily.     benazepril 40 MG tablet  Commonly known as:  LOTENSIN  Take 40 mg by mouth daily.     bimatoprost 0.01 % Soln  Commonly known as:  LUMIGAN  Place 1 drop into both eyes at bedtime.     brimonidine 0.1 % Soln  Commonly known as:  [place]  1 drop into both eyes 3 (three) times daily.     colchicine 0.6 MG tablet  Commonly known as:  COLCRYS  Take 1 tablet (0.6 mg total) by mouth daily. Take 2 tablets as needed for gout flair     dorzolamide-timolol 22.3-6.8 MG/ML ophthalmic solution  Commonly known as:  COSOPT  Place 1 drop into both eyes 2 (two) times daily.     feeding supplement (GLUCERNA SHAKE) Liqd  Take 237 mLs by mouth 3 (three) times daily between  meals.     gabapentin 600 MG tablet  Commonly known as:  NEURONTIN  Take 600 mg by mouth 3 (three) times daily.     hydrochlorothiazide 12.5 MG capsule  Commonly known as:  MICROZIDE  Take 12.5 mg by mouth daily.     meloxicam 7.5 MG tablet  Commonly known as:  MOBIC  Take 1 tablet (7.5 mg total) by mouth daily as needed for pain.     metFORMIN 500 MG 24 hr tablet  Commonly known as:  GLUCOPHAGE-XR  Take 1 tablet (500 mg total) by mouth 2 (two) times daily.     oxyCODONE 5 MG immediate release tablet  Commonly known as:  Oxy IR/ROXICODONE  Take one tablet by mouth every 6 hours as needed for pain     traMADol 50 MG tablet  Commonly known as:  ULTRAM  Take 1 tablet (50 mg total) by mouth 3 (three) times daily as needed (pain).       Allergies  Allergen Reactions  . Codeine Other (See Comments)    Causes shaking       Follow-up Information   Follow up with [facility]. (Someone from [facility] will contact you concerning date and time for Physical therapy to begin. )    Contact information:   [place] [address] [place] [address] [phone]       Follow up with Salena Saner., MD In 2 weeks.   Specialty:  Internal Medicine   Contact information:   [address] [place] [address] [phone]        The results of significant diagnostics from this hospitalization (including imaging, microbiology, ancillary and laboratory) are listed below for reference.    Significant Diagnostic Studies: Dg Elbow Complete Right  08/15/2013   CLINICAL DATA:  No injury, chronic pain  EXAM: RIGHT ELBOW - COMPLETE 3+ VIEW  COMPARISON:  None.  FINDINGS: Four views of right elbow submitted. No acute fracture or subluxation. Degenerative changes are noted with spurring of distal humerus and proximal ulna. Spurring of radial head. No posterior fat pad sign.  IMPRESSION: No acute fracture or subluxation. Degenerative changes as described  above.   Electronically Signed   By: Lahoma Crocker M.D.   On: 08/15/2013 15:53   Dg Knee Complete 4 Views Left  08/15/2013   CLINICAL DATA:  Bilateral knee pain, no injury  EXAM: LEFT KNEE - COMPLETE 4+ VIEW  COMPARISON:  DG KNEE COMPLETE 4 VIEWS*L* dated 04/08/2013  FINDINGS: Mild narrowing of the medial compartment. No fracture or dislocation. A few tiny loose bodies are noted. 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 Moderate joint effusion.  IMPRESSION: Arthritis.  Joint effusion.   Electronically Signed   By: Skipper Cliche M.D.   On: 08/15/2013 16:26   Dg Knee Complete 4 Views Right  08/15/2013   CLINICAL DATA:  Chronic bilateral knee pain.  EXAM: RIGHT KNEE - COMPLETE 4+ VIEW  COMPARISON:  04/08/2013  FINDINGS:  Mild joint space narrowing and spurring, most pronounced in the patellofemoral compartment. Findings are similar to prior study. No fracture, subluxation or dislocation. There is a moderate joint effusion, also likely stable.  IMPRESSION: Mild to moderate degenerative changes as above with moderate joint effusion. Findings similar to prior study.   Electronically Signed   By: Rolm Baptise M.D.   On: 08/15/2013 17:06   EKG: SVT rate 175  Microbiology: Recent Results (from the past 240 hour(s))  BODY FLUID CULTURE     Status: None   Collection Time    08/15/13  3:40 AM      Result Value Ref Range Status   Specimen Description FLUID SYNOVIAL RIGHT ARM   Final   Special Requests ELBOW   Final   Gram Stain     Final   Value: ABUNDANT WBC PRESENT, PREDOMINANTLY PMN     NO ORGANISMS SEEN     Performed at [facility]   Culture     Final   Value: NO GROWTH 2 DAYS     Performed at [facility]   Report Status PENDING   Incomplete  ANAEROBIC CULTURE     Status: None   Collection Time    08/15/13  3:40 AM      Result Value Ref Range Status   Specimen Description FLUID SYNOVIAL RIGHT ARM   Final   Special Requests Normal RIGHT ELBOW   Final   Gram Stain     Final    Value: ABUNDANT WBC PRESENT, PREDOMINANTLY PMN     NO ORGANISMS SEEN     Performed at [facility]   Culture     Final   Value: NO ANAEROBES ISOLATED; CULTURE IN PROGRESS FOR 5 DAYS     Performed at [facility]   Report Status PENDING   Incomplete  GRAM STAIN     Status: None   Collection Time    08/15/13  3:41 AM      Result Value Ref Range Status   Specimen Description FLUID SYNOVIAL RIGHT ARM   Final   Special Requests ELBOW   Final   Gram Stain     Final   Value: NO ORGANISMS SEEN     ABUNDANT WBC PRESENT, PREDOMINANTLY PMN     Results Called to: Jolee Ewing [id number] [phone] [place]   Report Status 08/15/2013 FINAL   Final  ANAEROBIC CULTURE     Status: None   Collection Time    08/15/13  1:27 PM      Result Value Ref Range Status   Specimen Description SYNOVIAL FLUID RIGHT KNEE   Final   Special Requests FLUID ON SWAB   Final   Gram Stain     Final   Value: MODERATE WBC PRESENT,BOTH PMN AND MONONUCLEAR     NO SQUAMOUS EPITHELIAL CELLS SEEN     NO ORGANISMS SEEN     Performed at [facility]   Culture     Final   Value: NO ANAEROBES ISOLATED; CULTURE IN PROGRESS FOR 5 DAYS     Performed at [facility]   Report Status PENDING   Incomplete  BODY FLUID CULTURE     Status: None   Collection Time    08/15/13  1:28 PM      Result Value Ref Range Status   Specimen Description SYNOVIAL FLUID RIGHT KNEE   Final   Special Requests FLUID ON KNEE   Final   Gram Stain  Final   Value: WBC PRESENT,BOTH PMN AND MONONUCLEAR     NO ORGANISMS SEEN     Performed at [facility]   Culture     Final   Value: NO GROWTH 2 DAYS     Performed at [facility]   Report Status PENDING   Incomplete  ANAEROBIC CULTURE     Status: None   Collection Time    08/15/13  1:28 PM      Result Value Ref Range Status   Specimen Description SYNOVIAL FLUID KNEE LEFT   Final   Special Requests FLUID ON SWAB   Final   Gram Stain     Final   Value: MODERATE  WBC PRESENT,BOTH PMN AND MONONUCLEAR     NO SQUAMOUS EPITHELIAL CELLS SEEN     NO ORGANISMS SEEN     Performed at [facility]   Culture     Final   Value: NO ANAEROBES ISOLATED; CULTURE IN PROGRESS FOR 5 DAYS     Performed at [facility]   Report Status PENDING   Incomplete  BODY FLUID CULTURE     Status: None   Collection Time    08/15/13  1:29 PM      Result Value Ref Range Status   Specimen Description SYNOVIAL FLUID KNEE LEFT   Final   Special Requests FLUID ON SWAB   Final   Gram Stain     Final   Value: WBC PRESENT,BOTH PMN AND MONONUCLEAR     NO ORGANISMS SEEN     Performed at [facility]   Culture     Final   Value: NO GROWTH 2 DAYS     Performed at [facility]   Report Status PENDING   Incomplete     Labs: Basic Metabolic Panel:  Recent Labs Lab 08/15/13 0154 08/16/13 0630 08/18/13 0710  NA 139 137 142  K 3.9 4.5 4.8  CL 97 99 104  CO2 23 22 26   GLUCOSE 170* 229* 123*  BUN 20 48* 47*  CREATININE 1.18* 1.42* 1.28*  CALCIUM 9.4 9.0 9.7   Liver Function Tests: No results found for this basename: AST, ALT, ALKPHOS, BILITOT, PROT, ALBUMIN,  in the last 168 hours No results found for this basename: LIPASE, AMYLASE,  in the last 168 hours No results found for this basename: AMMONIA,  in the last 168 hours CBC:  Recent Labs Lab 08/15/13 0154 08/16/13 0630 08/17/13 1649 08/18/13 0710  WBC 25.4* 16.0* 15.4* 11.5*  NEUTROABS 21.1*  --  11.2*  --   HGB 11.1* 12.2 12.0 11.5*  HCT 30.9* 33.8* 33.2* 33.2*  MCV 81.7 81.1 81.4 82.4  PLT 430* 391 450* 484*   Cardiac Enzymes: No results found for this basename: CKTOTAL, CKMB, CKMBINDEX, TROPONINI,  in the last 168 hours BNP: BNP (last 3 results)  Recent Labs  04/12/13 1822 04/14/13 0540  PROBNP 1377.0* 671.6*   CBG:  Recent Labs Lab 08/17/13 1119 08/17/13 1648 08/17/13 2128 08/18/13 0639 08/18/13 1145  GLUCAP 201* 167* 236* 127* 166*      Signed:  Alezander Dimaano L  [facility] 08/18/2013, 12:13 PM

## 2013-08-19 NOTE — Progress Notes (Signed)
   CARE MANAGEMENT NOTE 08/19/2013  Patient:  Sarah Colon, Sarah Colon   Account Number:  [id number]  Date Initiated:  08/17/2013  Documentation initiated by:  Ricki Miller  Subjective/Objective Assessment:   68 yr old female s/p synovial aspiration of right elbow and bilateral knees.     Action/Plan:   Case manager discussed home health and DME needs at discharge. Her daughter will assist as needed.   Anticipated DC Date:  08/17/2013   Anticipated DC Plan:  [place]  CM consult      [facility] Choice  Resumption Of Svcs/PTA Provider   Choice offered to / List presented to:  C-1 Patient   DME arranged  Vassie Moselle      DME agency  [place] arranged  [place]   Status of service:  Completed, signed off Medicare Important Message given?   (If response is "NO", the following Medicare IM given date fields will be blank) Date Medicare IM given:   Date Additional Medicare IM given:    Discharge Disposition:  [place]  Per UR Regulation:    If discussed at Long Length of Stay Meetings, dates discussed:    Comments:  08/19/13 09:40 CM texted [facility] rep to notify of pt discharge last night after 20:00.  CM also notified [facility] of HHPT/OT/SW/aide order.  No other CM needs were communicated.  Mariane Masters, BSN, CM [phone].

## 2013-08-20 LAB — ANAEROBIC CULTURE: Special Requests: NORMAL

## 2013-08-20 NOTE — Progress Notes (Signed)
PT is recommending home with [facility] and not SNF.  Clinical Social Worker will sign off for now as social work intervention is no longer needed. Please consult [place] again if new need arises.   Rhea Pink, MSW [phone]

## 2013-08-31 ENCOUNTER — Other Ambulatory Visit: Payer: Self-pay | Admitting: Nurse Practitioner

## 2014-06-20 DIAGNOSIS — E119 Type 2 diabetes mellitus without complications: Secondary | ICD-10-CM | POA: Diagnosis not present

## 2014-07-20 DIAGNOSIS — E119 Type 2 diabetes mellitus without complications: Secondary | ICD-10-CM | POA: Diagnosis not present

## 2014-09-07 ENCOUNTER — Other Ambulatory Visit (HOSPITAL_COMMUNITY): Payer: Self-pay

## 2014-09-07 ENCOUNTER — Emergency Department (HOSPITAL_COMMUNITY): Payer: Medicare Other

## 2014-09-07 ENCOUNTER — Encounter (HOSPITAL_COMMUNITY): Payer: Self-pay | Admitting: *Deleted

## 2014-09-07 ENCOUNTER — Inpatient Hospital Stay (HOSPITAL_COMMUNITY)
Admission: EM | Admit: 2014-09-07 | Discharge: 2014-09-11 | DRG: 558 | Disposition: A | Discharge status: Skilled Nursing Facility | Payer: Medicare Other | Attending: Internal Medicine | Admitting: Internal Medicine

## 2014-09-07 DIAGNOSIS — E11610 Type 2 diabetes mellitus with diabetic neuropathic arthropathy: Secondary | ICD-10-CM | POA: Diagnosis not present

## 2014-09-07 DIAGNOSIS — D649 Anemia, unspecified: Secondary | ICD-10-CM | POA: Diagnosis not present

## 2014-09-07 DIAGNOSIS — Z87891 Personal history of nicotine dependence: Secondary | ICD-10-CM

## 2014-09-07 DIAGNOSIS — N390 Urinary tract infection, site not specified: Secondary | ICD-10-CM | POA: Diagnosis present

## 2014-09-07 DIAGNOSIS — M1000 Idiopathic gout, unspecified site: Secondary | ICD-10-CM | POA: Diagnosis not present

## 2014-09-07 DIAGNOSIS — R5381 Other malaise: Secondary | ICD-10-CM | POA: Diagnosis not present

## 2014-09-07 DIAGNOSIS — I5032 Chronic diastolic (congestive) heart failure: Secondary | ICD-10-CM | POA: Diagnosis not present

## 2014-09-07 DIAGNOSIS — M25562 Pain in left knee: Secondary | ICD-10-CM

## 2014-09-07 DIAGNOSIS — M25561 Pain in right knee: Secondary | ICD-10-CM | POA: Diagnosis not present

## 2014-09-07 DIAGNOSIS — R404 Transient alteration of awareness: Secondary | ICD-10-CM | POA: Diagnosis not present

## 2014-09-07 DIAGNOSIS — N183 Chronic kidney disease, stage 3 (moderate): Secondary | ICD-10-CM | POA: Diagnosis not present

## 2014-09-07 DIAGNOSIS — S8991XA Unspecified injury of right lower leg, initial encounter: Secondary | ICD-10-CM | POA: Diagnosis not present

## 2014-09-07 DIAGNOSIS — Z886 Allergy status to analgesic agent status: Secondary | ICD-10-CM

## 2014-09-07 DIAGNOSIS — M6282 Rhabdomyolysis: Secondary | ICD-10-CM | POA: Diagnosis present

## 2014-09-07 DIAGNOSIS — N179 Acute kidney failure, unspecified: Secondary | ICD-10-CM | POA: Diagnosis not present

## 2014-09-07 DIAGNOSIS — N289 Disorder of kidney and ureter, unspecified: Secondary | ICD-10-CM | POA: Insufficient documentation

## 2014-09-07 DIAGNOSIS — M1712 Unilateral primary osteoarthritis, left knee: Secondary | ICD-10-CM | POA: Diagnosis not present

## 2014-09-07 DIAGNOSIS — R079 Chest pain, unspecified: Secondary | ICD-10-CM | POA: Diagnosis not present

## 2014-09-07 DIAGNOSIS — E876 Hypokalemia: Secondary | ICD-10-CM | POA: Diagnosis not present

## 2014-09-07 DIAGNOSIS — M179 Osteoarthritis of knee, unspecified: Secondary | ICD-10-CM | POA: Diagnosis present

## 2014-09-07 DIAGNOSIS — Y92009 Unspecified place in unspecified non-institutional (private) residence as the place of occurrence of the external cause: Secondary | ICD-10-CM | POA: Diagnosis not present

## 2014-09-07 DIAGNOSIS — D72829 Elevated white blood cell count, unspecified: Secondary | ICD-10-CM | POA: Diagnosis not present

## 2014-09-07 DIAGNOSIS — I129 Hypertensive chronic kidney disease with stage 1 through stage 4 chronic kidney disease, or unspecified chronic kidney disease: Secondary | ICD-10-CM | POA: Diagnosis not present

## 2014-09-07 DIAGNOSIS — R0602 Shortness of breath: Secondary | ICD-10-CM | POA: Diagnosis not present

## 2014-09-07 DIAGNOSIS — I10 Essential (primary) hypertension: Secondary | ICD-10-CM | POA: Diagnosis present

## 2014-09-07 DIAGNOSIS — D631 Anemia in chronic kidney disease: Secondary | ICD-10-CM | POA: Diagnosis not present

## 2014-09-07 DIAGNOSIS — E1165 Type 2 diabetes mellitus with hyperglycemia: Secondary | ICD-10-CM | POA: Diagnosis not present

## 2014-09-07 DIAGNOSIS — S8992XA Unspecified injury of left lower leg, initial encounter: Secondary | ICD-10-CM | POA: Diagnosis not present

## 2014-09-07 DIAGNOSIS — R531 Weakness: Secondary | ICD-10-CM | POA: Diagnosis not present

## 2014-09-07 DIAGNOSIS — M25521 Pain in right elbow: Secondary | ICD-10-CM | POA: Diagnosis not present

## 2014-09-07 DIAGNOSIS — M109 Gout, unspecified: Secondary | ICD-10-CM | POA: Diagnosis not present

## 2014-09-07 DIAGNOSIS — M25569 Pain in unspecified knee: Secondary | ICD-10-CM | POA: Diagnosis present

## 2014-09-07 DIAGNOSIS — R000 Tachycardia, unspecified: Secondary | ICD-10-CM

## 2014-09-07 DIAGNOSIS — M2540 Effusion, unspecified joint: Secondary | ICD-10-CM

## 2014-09-07 DIAGNOSIS — E1340 Other specified diabetes mellitus with diabetic neuropathy, unspecified: Secondary | ICD-10-CM | POA: Diagnosis not present

## 2014-09-07 DIAGNOSIS — D72828 Other elevated white blood cell count: Secondary | ICD-10-CM | POA: Diagnosis not present

## 2014-09-07 DIAGNOSIS — M25461 Effusion, right knee: Secondary | ICD-10-CM | POA: Diagnosis not present

## 2014-09-07 DIAGNOSIS — T380X5A Adverse effect of glucocorticoids and synthetic analogues, initial encounter: Secondary | ICD-10-CM | POA: Diagnosis not present

## 2014-09-07 DIAGNOSIS — S299XXA Unspecified injury of thorax, initial encounter: Secondary | ICD-10-CM | POA: Diagnosis not present

## 2014-09-07 DIAGNOSIS — R197 Diarrhea, unspecified: Secondary | ICD-10-CM | POA: Diagnosis present

## 2014-09-07 DIAGNOSIS — E860 Dehydration: Secondary | ICD-10-CM | POA: Diagnosis not present

## 2014-09-07 DIAGNOSIS — W19XXXA Unspecified fall, initial encounter: Secondary | ICD-10-CM | POA: Diagnosis present

## 2014-09-07 DIAGNOSIS — E119 Type 2 diabetes mellitus without complications: Secondary | ICD-10-CM | POA: Diagnosis not present

## 2014-09-07 DIAGNOSIS — M009 Pyogenic arthritis, unspecified: Secondary | ICD-10-CM | POA: Diagnosis not present

## 2014-09-07 DIAGNOSIS — I471 Supraventricular tachycardia: Secondary | ICD-10-CM | POA: Diagnosis not present

## 2014-09-07 HISTORY — DX: Low back pain, unspecified: M54.50

## 2014-09-07 HISTORY — DX: Type 2 diabetes mellitus without complications: E11.9

## 2014-09-07 HISTORY — DX: Other chronic pain: G89.29

## 2014-09-07 HISTORY — DX: Low back pain: M54.5

## 2014-09-07 LAB — CBC WITH DIFFERENTIAL/PLATELET
BASOS ABS: 0 10*3/uL (ref 0.0–0.1)
Basophils Relative: 0 % (ref 0–1)
EOS ABS: 0.1 10*3/uL (ref 0.0–0.7)
EOS PCT: 1 % (ref 0–5)
HCT: 37.2 % (ref 36.0–46.0)
Hemoglobin: 13 g/dL (ref 12.0–15.0)
LYMPHS PCT: 14 % (ref 12–46)
Lymphs Abs: 2 10*3/uL (ref 0.7–4.0)
MCH: 29.7 pg (ref 26.0–34.0)
MCHC: 34.9 g/dL (ref 30.0–36.0)
MCV: 85.1 fL (ref 78.0–100.0)
MONOS PCT: 8 % (ref 3–12)
Monocytes Absolute: 1.2 10*3/uL — ABNORMAL HIGH (ref 0.1–1.0)
NEUTROS ABS: 11.6 10*3/uL — AB (ref 1.7–7.7)
Neutrophils Relative %: 77 % (ref 43–77)
PLATELETS: 574 10*3/uL — AB (ref 150–400)
RBC: 4.37 MIL/uL (ref 3.87–5.11)
RDW: 13.8 % (ref 11.5–15.5)
WBC: 14.9 10*3/uL — ABNORMAL HIGH (ref 4.0–10.5)

## 2014-09-07 LAB — PROTIME-INR
INR: 1.2 (ref 0.00–1.49)
PROTHROMBIN TIME: 15.3 s — AB (ref 11.6–15.2)

## 2014-09-07 LAB — TROPONIN I: Troponin I: <0.03 ng/mL (ref <0.031)

## 2014-09-07 LAB — COMPREHENSIVE METABOLIC PANEL
ALBUMIN: 2.8 g/dL — AB (ref 3.5–5.2)
ALK PHOS: 105 U/L (ref 39–117)
ALT: 71 U/L — ABNORMAL HIGH (ref 0–35)
ANION GAP: 17 — AB (ref 5–15)
AST: 84 U/L — AB (ref 0–37)
BILIRUBIN TOTAL: 0.7 mg/dL (ref 0.3–1.2)
BUN: 85 mg/dL — ABNORMAL HIGH (ref 6–23)
CHLORIDE: 100 mmol/L (ref 96–112)
CO2: 22 mmol/L (ref 19–32)
Calcium: 9.3 mg/dL (ref 8.4–10.5)
Creatinine, Ser: 2 mg/dL — ABNORMAL HIGH (ref 0.50–1.10)
GFR calc Af Amer: 28 mL/min — ABNORMAL LOW (ref >90)
GFR calc non Af Amer: 24 mL/min — ABNORMAL LOW (ref >90)
GLUCOSE: 177 mg/dL — AB (ref 70–99)
Potassium: 2.9 mmol/L — ABNORMAL LOW (ref 3.5–5.1)
Sodium: 139 mmol/L (ref 135–145)
Total Protein: 7.7 g/dL (ref 6.0–8.3)

## 2014-09-07 LAB — URINALYSIS, ROUTINE W REFLEX MICROSCOPIC
Bilirubin Urine: NEGATIVE
GLUCOSE, UA: NEGATIVE mg/dL
Ketones, ur: NEGATIVE mg/dL
Nitrite: POSITIVE — AB
Protein, ur: NEGATIVE mg/dL
Specific Gravity, Urine: 1.012 (ref 1.005–1.030)
UROBILINOGEN UA: 0.2 mg/dL (ref 0.0–1.0)
pH: 5.5 (ref 5.0–8.0)

## 2014-09-07 LAB — URINE MICROSCOPIC-ADD ON

## 2014-09-07 LAB — I-STAT CG4 LACTIC ACID, ED
Lactic Acid, Venous: 1.15 mmol/L (ref 0.5–2.0)
Lactic Acid, Venous: 1.86 mmol/L (ref 0.5–2.0)

## 2014-09-07 LAB — CLOSTRIDIUM DIFFICILE BY PCR: CDIFFPCR: NEGATIVE

## 2014-09-07 LAB — CK: CK TOTAL: 3804 U/L — AB (ref 7–177)

## 2014-09-07 LAB — GLUCOSE, CAPILLARY: GLUCOSE-CAPILLARY: 131 mg/dL — AB (ref 70–99)

## 2014-09-07 LAB — SEDIMENTATION RATE: SED RATE: 105 mm/h — AB (ref 0–22)

## 2014-09-07 LAB — C-REACTIVE PROTEIN: CRP: 14.6 mg/dL — AB (ref <0.60)

## 2014-09-07 LAB — URIC ACID: Uric Acid, Serum: 17.4 mg/dL — ABNORMAL HIGH (ref 2.4–7.0)

## 2014-09-07 MED ORDER — MORPHINE SULFATE 4 MG/ML IJ SOLN
4.0000 mg | Freq: Once | INTRAMUSCULAR | Status: AC
  Administered 2014-09-07: 4 mg via INTRAVENOUS
  Filled 2014-09-07: qty 1

## 2014-09-07 MED ORDER — SODIUM CHLORIDE 0.9 % IV BOLUS (SEPSIS)
500.0000 mL | Freq: Once | INTRAVENOUS | Status: AC
  Administered 2014-09-07: 500 mL via INTRAVENOUS

## 2014-09-07 MED ORDER — ONDANSETRON HCL 4 MG/2ML IJ SOLN: 4.0000 mg | Freq: Three times a day (TID) | INTRAMUSCULAR | Status: DC | PRN

## 2014-09-07 MED ORDER — LIDOCAINE HCL (PF) 1 % IJ SOLN
30.0000 mL | Freq: Once | INTRAMUSCULAR | Status: DC
  Filled 2014-09-07: qty 30

## 2014-09-07 MED ORDER — POTASSIUM CHLORIDE CRYS ER 20 MEQ PO TBCR
40.0000 meq | EXTENDED_RELEASE_TABLET | Freq: Once | ORAL | Status: AC
  Administered 2014-09-07: 40 meq via ORAL
  Filled 2014-09-07: qty 2

## 2014-09-07 MED ORDER — INSULIN ASPART 100 UNIT/ML ~~LOC~~ SOLN
0.0000 [IU] | Freq: Three times a day (TID) | SUBCUTANEOUS | Status: DC
  Administered 2014-09-08: 3 [IU] via SUBCUTANEOUS
  Administered 2014-09-08 (×2): 7 [IU] via SUBCUTANEOUS
  Administered 2014-09-09: 3 [IU] via SUBCUTANEOUS

## 2014-09-07 MED ORDER — DORZOLAMIDE HCL-TIMOLOL MAL 2-0.5 % OP SOLN
1.0000 [drp] | Freq: Two times a day (BID) | OPHTHALMIC | Status: DC
  Administered 2014-09-07 – 2014-09-11 (×7): 1 [drp] via OPHTHALMIC
  Filled 2014-09-07: qty 10

## 2014-09-07 MED ORDER — MORPHINE SULFATE 2 MG/ML IJ SOLN
2.0000 mg | INTRAMUSCULAR | Status: DC | PRN
  Administered 2014-09-07 – 2014-09-10 (×5): 2 mg via INTRAVENOUS
  Filled 2014-09-07 (×5): qty 1

## 2014-09-07 MED ORDER — BRIMONIDINE TARTRATE 0.2 % OP SOLN
1.0000 [drp] | Freq: Three times a day (TID) | OPHTHALMIC | Status: DC
  Administered 2014-09-07 – 2014-09-11 (×11): 1 [drp] via OPHTHALMIC
  Filled 2014-09-07: qty 5

## 2014-09-07 MED ORDER — NICOTINE 21 MG/24HR TD PT24
21.0000 mg | MEDICATED_PATCH | Freq: Every day | TRANSDERMAL | Status: DC
  Filled 2014-09-07 (×2): qty 1

## 2014-09-07 MED ORDER — SODIUM CHLORIDE 0.9 % IJ SOLN
3.0000 mL | Freq: Two times a day (BID) | INTRAMUSCULAR | Status: DC
  Administered 2014-09-07 – 2014-09-11 (×6): 3 mL via INTRAVENOUS

## 2014-09-07 MED ORDER — GLUCERNA SHAKE PO LIQD
237.0000 mL | Freq: Three times a day (TID) | ORAL | Status: DC
  Administered 2014-09-08 – 2014-09-11 (×10): 237 mL via ORAL

## 2014-09-07 MED ORDER — CEFTRIAXONE SODIUM IN DEXTROSE 20 MG/ML IV SOLN
1.0000 g | INTRAVENOUS | Status: DC
  Administered 2014-09-07 – 2014-09-10 (×3): 1 g via INTRAVENOUS
  Filled 2014-09-07 (×4): qty 50

## 2014-09-07 MED ORDER — AMLODIPINE BESYLATE 10 MG PO TABS
10.0000 mg | ORAL_TABLET | Freq: Every day | ORAL | Status: DC
  Administered 2014-09-08 – 2014-09-11 (×4): 10 mg via ORAL
  Filled 2014-09-07 (×4): qty 1

## 2014-09-07 MED ORDER — ACETAMINOPHEN 325 MG PO TABS
650.0000 mg | ORAL_TABLET | Freq: Four times a day (QID) | ORAL | Status: DC | PRN
Start: 2014-09-07 — End: 2014-09-07

## 2014-09-07 MED ORDER — HEPARIN SODIUM (PORCINE) 5000 UNIT/ML IJ SOLN
5000.0000 [IU] | Freq: Three times a day (TID) | INTRAMUSCULAR | Status: DC
  Administered 2014-09-07 – 2014-09-11 (×11): 5000 [IU] via SUBCUTANEOUS
  Filled 2014-09-07 (×15): qty 1

## 2014-09-07 MED ORDER — OXYCODONE HCL 5 MG PO TABS
5.0000 mg | ORAL_TABLET | ORAL | Status: DC | PRN
  Administered 2014-09-07 – 2014-09-11 (×10): 5 mg via ORAL
  Filled 2014-09-07 (×10): qty 1

## 2014-09-07 MED ORDER — SODIUM CHLORIDE 0.9 % IV SOLN: Freq: Once | INTRAVENOUS | Status: DC

## 2014-09-07 MED ORDER — SODIUM CHLORIDE 0.9 % IV SOLN
INTRAVENOUS | Status: DC
  Administered 2014-09-07: via INTRAVENOUS
  Administered 2014-09-08: 1000 mL via INTRAVENOUS
  Administered 2014-09-09 (×2): via INTRAVENOUS

## 2014-09-07 MED ORDER — LIDOCAINE-EPINEPHRINE (PF) 2 %-1:200000 IJ SOLN
20.0000 mL | Freq: Once | INTRAMUSCULAR | Status: DC
  Filled 2014-09-07: qty 20

## 2014-09-07 MED ORDER — GABAPENTIN 300 MG PO CAPS
600.0000 mg | ORAL_CAPSULE | Freq: Three times a day (TID) | ORAL | Status: DC
Start: 2014-09-07 — End: 2014-09-07
  Filled 2014-09-07: qty 2

## 2014-09-07 MED ORDER — HYDRALAZINE HCL 20 MG/ML IJ SOLN: 5.0000 mg | INTRAMUSCULAR | Status: DC | PRN

## 2014-09-07 MED ORDER — LATANOPROST 0.005 % OP SOLN
1.0000 [drp] | Freq: Every day | OPHTHALMIC | Status: DC
  Administered 2014-09-07 – 2014-09-10 (×4): 1 [drp] via OPHTHALMIC
  Filled 2014-09-07: qty 2.5

## 2014-09-07 MED ORDER — BRIMONIDINE TARTRATE 0.15 % OP SOLN
1.0000 [drp] | Freq: Three times a day (TID) | OPHTHALMIC | Status: DC
  Filled 2014-09-07: qty 5

## 2014-09-07 MED ORDER — GABAPENTIN 600 MG PO TABS
600.0000 mg | ORAL_TABLET | Freq: Every day | ORAL | Status: DC
  Administered 2014-09-07 – 2014-09-10 (×4): 600 mg via ORAL
  Filled 2014-09-07 (×5): qty 1

## 2014-09-07 MED ORDER — METHYLPREDNISOLONE SODIUM SUCC 125 MG IJ SOLR
60.0000 mg | Freq: Once | INTRAMUSCULAR | Status: AC
  Administered 2014-09-07: 60 mg via INTRAVENOUS
  Filled 2014-09-07: qty 2

## 2014-09-07 MED ORDER — POTASSIUM CHLORIDE 10 MEQ/100ML IV SOLN
10.0000 meq | INTRAVENOUS | Status: AC
  Administered 2014-09-08 (×2): 10 meq via INTRAVENOUS
  Filled 2014-09-07 (×2): qty 100

## 2014-09-07 MED ORDER — ONDANSETRON HCL 4 MG/2ML IJ SOLN
4.0000 mg | Freq: Once | INTRAMUSCULAR | Status: AC
  Administered 2014-09-07: 4 mg via INTRAVENOUS
  Filled 2014-09-07: qty 2

## 2014-09-07 MED ORDER — COLCHICINE 0.6 MG PO TABS
0.6000 mg | ORAL_TABLET | ORAL | Status: DC | PRN
  Filled 2014-09-07: qty 1

## 2014-09-07 MED ORDER — PREDNISONE 10 MG PO TABS
10.0000 mg | ORAL_TABLET | Freq: Every day | ORAL | Status: DC
  Administered 2014-09-08 – 2014-09-10 (×3): 10 mg via ORAL
  Filled 2014-09-07 (×4): qty 1

## 2014-09-07 MED ORDER — HYDRALAZINE HCL 10 MG PO TABS
10.0000 mg | ORAL_TABLET | Freq: Three times a day (TID) | ORAL | Status: DC
  Administered 2014-09-07 – 2014-09-08 (×2): 10 mg via ORAL
  Filled 2014-09-07 (×5): qty 1

## 2014-09-07 NOTE — ED Provider Notes (Signed)
Will admit to hospitalist.   Clinical Impression 1. Fall, initial encounter   2. Renal insufficiency   3. AKI (acute kidney injury)      Pamella Pert, MD 09/08/14 1217

## 2014-09-07 NOTE — ED Notes (Signed)
Pt to ED via [facility] after being found on the floor by family. EMS reports pt has been lying on the floor since Thursday. Pt's brothers drove from [facility] after not hearing from the patient for a few days and found pt covered in urine and feces lying on the floor. Pt c/o of bilateral leg pain from "scooting around on the floor." Pt reports feeling "more comfortable on the floor, so I fixed a pallet on the floor next to the bed and told my daughters to leave me there but I guess I laid there too long."

## 2014-09-07 NOTE — ED Notes (Signed)
Patient transported to X-ray 
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
## 2014-09-07 NOTE — H&P (Signed)
[facility] History and Physical  Sarah Colon [id number] DOB: 1945/07/29 DOA: 09/07/2014  Referring physician: ED physician PCP: No primary care provider on file.  Specialists:   Chief Complaint: Diarrhea, dysuria, bilaterally knee pain,  HPI: Sarah Colon is a 69 y.o. female with past medical history of hypertension, gout, diabetes mellitus, congestive heart failure (EF of 60-65% with grade 1 diastolic dysfunction), chronic kidney disease-stage III, history of septic arthritis, chronic leukocytosis, who presents with diarrhea, dysuria, bilateral knee pain.  Patient reports that in the past several days, she has been having diarrhea. She has 3-4 times of bowel movement with loose stool each day. She did not see blood in the stools. She has mild abdominal discomfort, no nausea or vomiting. She also reports having burning on urination and dysuria recently. Initially I was told by EDP that patient had a fall and was found on the floor at home, but she denied having had a fall to me. She told me that she just wanted to sleep on the floor because she likes to sleep on hard board bed.  She reports that she has chronic knee pain because of osteoarthritis and gout. Her knee pain has been worsening than her baseline. She also has slightly worsening swelling in her bilateral knees. Her right elbow is also painful and swelling. Patient denies fever, chills, headaches, cough, chest pain, SOB, skin rashes. No unilateral weakness, numbness or tingling sensations. No vision change or hearing loss.  In ED, patient was found to have worsening renal Fx with Cr up from baseline 1.2-1.4 to 2.00, leukocytosis with WBC 14.9 (pt has a chronic leukocytosis with baseline 11.5-25.4), positive urinalysis, and now 1.2, lactate of 1.86, negative troponin, which are normal, no tachycardia, potassium 2.9. x-ray of left knee showed mild effusion, right knee negative for acute abnormalities. Patient is admitted to  inpatient for further evaluation and treatment.  Review of Systems: As presented in the history of presenting illness, rest negative.  Where does patient live?  At home alone Can patient participate in ADLs? yes  Allergy:  Allergies  Allergen Reactions  . Codeine Other (See Comments)    Causes shaking    Past Medical History  Diagnosis Date  . Arthritis   . Diabetes mellitus without complication   . Chronic pain of left knee   . Gout dx 04/09/2013    knee tapped by ortho with monosodium urate crystals  . Hypertension     Past Surgical History  Procedure Laterality Date  . Skin grafts    . Knee aspiration Bilateral 08/15/2013    DR Eliseo Squires    Social History:  reports that she quit smoking about 17 years ago. She has never used smokeless tobacco. She reports that she does not drink alcohol or use illicit drugs.  Family History: History reviewed. No pertinent family history.   Prior to Admission medications   Medication Sig Start Date End Date Taking? Authorizing Provider  acetaminophen (TYLENOL) 325 MG tablet Take 2 tablets (650 mg total) by mouth every 6 (six) hours as needed for mild pain (or Fever >/= 101). 04/17/13   Annita Brod, MD  amLODipine (NORVASC) 10 MG tablet Take 5 mg by mouth daily. 04/17/13   Annita Brod, MD  benazepril (LOTENSIN) 40 MG tablet Take 40 mg by mouth daily.    Historical Provider, MD  bimatoprost (LUMIGAN) 0.01 % SOLN Place 1 drop into both eyes at bedtime.   Yes Historical Provider, MD  brimonidine (ALPHAGAN P)  0.1 % SOLN Place 1 drop into both eyes 3 (three) times daily.   Yes Historical Provider, MD  colchicine (COLCRYS) 0.6 MG tablet Take 1 tablet (0.6 mg total) by mouth daily. Take 2 tablets as needed for gout flair 08/18/13  Yes Delfina Redwood, MD  dorzolamide-timolol (COSOPT) 22.3-6.8 MG/ML ophthalmic solution Place 1 drop into both eyes 2 (two) times daily.   Yes Historical Provider, MD  feeding supplement, GLUCERNA SHAKE,  (GLUCERNA SHAKE) LIQD Take 237 mLs by mouth 3 (three) times daily between meals. Patient not taking: Reported on 09/07/2014 04/17/13   Annita Brod, MD  gabapentin (NEURONTIN) 600 MG tablet Take 600 mg by mouth 3 (three) times daily.    Yes Historical Provider, MD  hydrochlorothiazide (MICROZIDE) 12.5 MG capsule Take 12.5 mg by mouth daily.    Historical Provider, MD  meloxicam (MOBIC) 15 MG tablet Take 15 mg by mouth daily.   Yes Historical Provider, MD  meloxicam (MOBIC) 7.5 MG tablet Take 1 tablet (7.5 mg total) by mouth daily as needed for pain. Patient not taking: Reported on 09/07/2014 08/18/13   Delfina Redwood, MD  metFORMIN (GLUCOPHAGE-XR) 500 MG 24 hr tablet Take 1 tablet (500 mg total) by mouth 2 (two) times daily. 04/17/13  Yes Annita Brod, MD  oxyCODONE (OXY IR/ROXICODONE) 5 MG immediate release tablet Take one tablet by mouth every 6 hours as needed for pain 06/22/13   Mardene Celeste, NP  traMADol (ULTRAM) 50 MG tablet Take 1 tablet (50 mg total) by mouth 3 (three) times daily as needed (pain). 04/18/13  Yes Lauree Chandler, NP  valsartan-hydrochlorothiazide (DIOVAN-HCT) 320-25 MG per tablet Take 1 tablet by mouth daily.   Yes Historical Provider, MD    Physical Exam: Filed Vitals:   09/07/14 1800 09/07/14 1830 09/07/14 1900 09/07/14 2059  BP: 140/58 132/68 140/58 140/62  Pulse: 75 73 80 82  Temp:    99.7 F (37.6 C)  TempSrc:    Oral  Resp: 14 18 15 16   SpO2: 100% 100% 99% 100%   General: Not in acute distress HEENT:       Eyes: PERRL, EOMI, no scleral icterus       ENT: No discharge from the ears and nose, no pharynx injection, no tonsillar enlargement.        Neck: No JVD, no bruit, no mass felt. Cardiac: S1/S2, RRR, No murmurs, No gallops or rubs Pulm: Good air movement bilaterally. Clear to auscultation bilaterally. No rales, wheezing, rhonchi or rubs. Abd: Soft, nondistended, nontender, no rebound pain, no organomegaly, BS present Ext: 2+DP/PT pulse  bilaterally. There is tenderness and mild swelling over bilaterally knee and right elbow. There is no redness or warmth in these joints. Musculoskeletal: No joint deformities, erythema, or stiffness, ROM full Skin: No rashes.  Neuro: Alert and oriented X3, cranial nerves II-XII grossly intact, muscle strength 5/5 in all extremeties, sensation to light touch intact. Psych: Patient is not psychotic, no suicidal or hemocidal ideation.  Labs on Admission:  Basic Metabolic Panel:  Recent Labs Lab 09/07/14 1548  NA 139  K 2.9*  CL 100  CO2 22  GLUCOSE 177*  BUN 85*  CREATININE 2.00*  CALCIUM 9.3   Liver Function Tests:  Recent Labs Lab 09/07/14 1548  AST 84*  ALT 71*  ALKPHOS 105  BILITOT 0.7  PROT 7.7  ALBUMIN 2.8*   No results for input(s): LIPASE, AMYLASE in the last 168 hours. No results for input(s): AMMONIA in the  last 168 hours. CBC:  Recent Labs Lab 09/07/14 1548  WBC 14.9*  NEUTROABS 11.6*  HGB 13.0  HCT 37.2  MCV 85.1  PLT 574*   Cardiac Enzymes:  Recent Labs Lab 09/07/14 1548  CKTOTAL 3804*  TROPONINI <0.03    BNP (last 3 results) No results for input(s): BNP in the last 8760 hours.  ProBNP (last 3 results) No results for input(s): PROBNP in the last 8760 hours.  CBG: No results for input(s): GLUCAP in the last 168 hours.  Radiological Exams on Admission: Dg Chest 2 View  09/07/2014   CLINICAL DATA:  Fall.  EXAM: CHEST  2 VIEW  COMPARISON:  April 07, 2013.  FINDINGS: The heart size and mediastinal contours are within normal limits. Both lungs are clear. No pneumothorax or pleural effusion is noted. The visualized skeletal structures are unremarkable.  IMPRESSION: No active cardiopulmonary disease.   Electronically Signed   By: Marijo Conception, M.D.   On: 09/07/2014 15:37   Dg Knee Complete 4 Views Left  09/07/2014   CLINICAL DATA:  Acute left knee pain after fall.  Initial encounter.  EXAM: LEFT KNEE - COMPLETE 4+ VIEW  COMPARISON:  August 15, 2013.  FINDINGS: No fracture or dislocation is noted. Mild suprapatellar joint effusion is noted. Osteophyte formation is seen involving patella inferiorly in superiorly. Mild narrowing of medial joint space is noted.  IMPRESSION: Mild degenerative joint disease is noted medially. Mild suprapatellar joint effusion. No fracture or dislocation is noted.   Electronically Signed   By: Marijo Conception, M.D.   On: 09/07/2014 15:36   Dg Knee Complete 4 Views Right  09/07/2014   CLINICAL DATA:  Fall with right knee pain.  EXAM: RIGHT KNEE - COMPLETE 4+ VIEW  COMPARISON:  08/15/2013  FINDINGS: Diffuse decreased bone mineralization. There are mild tricompartmental degenerative changes. No evidence of acute fracture or dislocation. No significant joint effusion.  IMPRESSION: No acute fracture.   Electronically Signed   By: Marin Olp M.D.   On: 09/07/2014 15:37    EKG: Independently reviewed. Biphasic T-wave in V3 to V6 which is similar to previous EKG on 08/05/13., Q wave in lead 3 only.   Assessment/Plan Principal Problem:   Rhabdomyolysis Active Problems:   Type 2 diabetes mellitus with diabetic neuropathic arthropathy   Knee pain   Chronic diastolic heart failure   Leukocytosis   Hypertension   Physical deconditioning   Hypokalemia   Diarrhea   UTI (urinary tract infection)  Rhabdomyolysis: Total CK 3804. This is likely due to immobility secondary to bilaterally knee pain. Patient has worsening renal function. -will admit to tele bed   - will admit to med-Surg bed  - IVF: received 500 mL of NS in ED, will continue at 125 cc/h. Patient has diastolic congestive heart failure, limiting aggressive IV fluid - Hold HCTZ, Mobic - CK in AM - check phosphrus level and Mag level  - CMP for monitoring renal Fx and LF  - strict I/o for monitoring urine output  - avoid NSAIDs and tylenol given elevated AST and ALT  Gout: Patient has bilateral knee pain and right elbow pain. It is most likely due to  gout flare up mildly. She has very mild effusion in these joints. It is less likely to have septic joint, given no warmth and redness. Her leukocytosis is chronic issue. No fever. -Continue colchicine -Solu-Medrol 60 mg 1, followed by prednisone 10 mg daily -Continue home oxycodone when necessary -check uric  acid  UTI: Patient is symptomatic with positive urinalysis. -Rocephin IV -Follow-up blood culture and urine culture  AoCKD-III: Cr up from baseline 1.2-1.4 to 2.00. Likely due to UTI and rhabdomyolysis. -Treated UTI and rhabdomyolysis as above -Check FeUrea and renal ultrasound  HTN:  -Hold HCTZ since patient has gout, HCTZ may increase uric acid level -Holder Modic, Valsartan and Lotensin due to AoCKD-III - Increase amlodipine dose from 5-10 mg daily -Add hydralazine 10 mg every 8 hours orally  hydralazine when necessary IV  Hypokalemia: Potassium 2.9 -repleted  -check magnesium level  -follow-up renal function. BMP.  Diabetes mellitus: A1c was 7.0 on 08/15/13. Patient is on metformin at home. -sliding scale insulin.  Diarrhea: -IV fluid as above -follow-up C diff pcr and GI pathogen panel  Chronic leukocytosis: Patient was seen in the past by Dr. Earlie Server on 05/16/13, thought this was likely reactive in nature secondary to infection and gouty arthritis. BCR/ABL and JAK-2 mutation tests were done. BCR/ABL is negative, I could not find the results of JAK-2 mutation test. Still has persistent leukocytosis. -may follow up with Dr. Julien Nordmann  Diastolic congestive heart failure: 2-D echo on 04/10/13 showed EF of 60-65% with grade 1 diastolic dysfunction. It is compensated on admission. Patient does not have leg edema. -Check BNP  DVT ppx: SQ Heparin          Code Status: Full code Family Communication: None at bed side.   Disposition Plan: Admit to inpatient   Date of Service 09/07/2014    Ivor Costa [facility] Pager [phone]  If 7PM-7AM, please contact  night-coverage [email or web address] Password [email or web address] 09/07/2014, 9:15 PM

## 2014-09-08 ENCOUNTER — Inpatient Hospital Stay (HOSPITAL_COMMUNITY): Payer: Medicare Other

## 2014-09-08 DIAGNOSIS — R000 Tachycardia, unspecified: Secondary | ICD-10-CM

## 2014-09-08 LAB — CBC
HEMATOCRIT: 33.3 % — AB (ref 36.0–46.0)
HEMOGLOBIN: 11.4 g/dL — AB (ref 12.0–15.0)
MCH: 29.1 pg (ref 26.0–34.0)
MCHC: 34.2 g/dL (ref 30.0–36.0)
MCV: 84.9 fL (ref 78.0–100.0)
Platelets: 583 10*3/uL — ABNORMAL HIGH (ref 150–400)
RBC: 3.92 MIL/uL (ref 3.87–5.11)
RDW: 13.9 % (ref 11.5–15.5)
WBC: 13.6 10*3/uL — ABNORMAL HIGH (ref 4.0–10.5)

## 2014-09-08 LAB — COMPREHENSIVE METABOLIC PANEL
ALK PHOS: 98 U/L (ref 39–117)
ALT: 58 U/L — ABNORMAL HIGH (ref 0–35)
AST: 55 U/L — ABNORMAL HIGH (ref 0–37)
Albumin: 2.3 g/dL — ABNORMAL LOW (ref 3.5–5.2)
Anion gap: 10 (ref 5–15)
BILIRUBIN TOTAL: 0.5 mg/dL (ref 0.3–1.2)
BUN: 64 mg/dL — ABNORMAL HIGH (ref 6–23)
CALCIUM: 8.7 mg/dL (ref 8.4–10.5)
CHLORIDE: 107 mmol/L (ref 96–112)
CO2: 25 mmol/L (ref 19–32)
Creatinine, Ser: 1.66 mg/dL — ABNORMAL HIGH (ref 0.50–1.10)
GFR calc Af Amer: 35 mL/min — ABNORMAL LOW (ref >90)
GFR, EST NON AFRICAN AMERICAN: 30 mL/min — AB (ref >90)
GLUCOSE: 234 mg/dL — AB (ref 70–99)
POTASSIUM: 4.3 mmol/L (ref 3.5–5.1)
SODIUM: 142 mmol/L (ref 135–145)
Total Protein: 7.1 g/dL (ref 6.0–8.3)

## 2014-09-08 LAB — CK: CK TOTAL: 2293 U/L — AB (ref 7–177)

## 2014-09-08 LAB — GLUCOSE, CAPILLARY
GLUCOSE-CAPILLARY: 317 mg/dL — AB (ref 70–99)
Glucose-Capillary: 235 mg/dL — ABNORMAL HIGH (ref 70–99)
Glucose-Capillary: 346 mg/dL — ABNORMAL HIGH (ref 70–99)
Glucose-Capillary: 272 mg/dL — ABNORMAL HIGH (ref 70–99)

## 2014-09-08 LAB — MAGNESIUM: Magnesium: 2.3 mg/dL (ref 1.5–2.5)

## 2014-09-08 LAB — CREATININE, URINE, RANDOM: Creatinine, Urine: 49.77 mg/dL

## 2014-09-08 LAB — PHOSPHORUS: PHOSPHORUS: 3.5 mg/dL (ref 2.3–4.6)

## 2014-09-08 LAB — TROPONIN I
Troponin I: <0.03 ng/mL (ref <0.031)
Troponin I: <0.03 ng/mL (ref <0.031)
Troponin I: 0.03 ng/mL (ref <0.031)

## 2014-09-08 LAB — TSH: TSH: 0.694 u[IU]/mL (ref 0.350–4.500)

## 2014-09-08 LAB — BRAIN NATRIURETIC PEPTIDE: B Natriuretic Peptide: 64.5 pg/mL (ref 0.0–100.0)

## 2014-09-08 LAB — D-DIMER, QUANTITATIVE (NOT AT ARMC): D DIMER QUANT: 1.98 ug{FEU}/mL — AB (ref 0.00–0.48)

## 2014-09-08 MED ORDER — CARVEDILOL 6.25 MG PO TABS
6.2500 mg | ORAL_TABLET | Freq: Two times a day (BID) | ORAL | Status: DC
  Administered 2014-09-08 – 2014-09-11 (×6): 6.25 mg via ORAL
  Filled 2014-09-08 (×10): qty 1

## 2014-09-08 MED ORDER — ASPIRIN EC 325 MG PO TBEC
325.0000 mg | DELAYED_RELEASE_TABLET | Freq: Every day | ORAL | Status: DC
  Administered 2014-09-08 – 2014-09-11 (×4): 325 mg via ORAL
  Filled 2014-09-08 (×5): qty 1

## 2014-09-08 MED ORDER — NITROGLYCERIN 0.4 MG SL SUBL: 0.4000 mg | SUBLINGUAL_TABLET | SUBLINGUAL | Status: DC | PRN

## 2014-09-08 MED ORDER — COLCHICINE 0.6 MG PO TABS
0.6000 mg | ORAL_TABLET | Freq: Every day | ORAL | Status: DC
  Administered 2014-09-08 – 2014-09-11 (×4): 0.6 mg via ORAL
  Filled 2014-09-08 (×5): qty 1

## 2014-09-08 MED ORDER — METOPROLOL TARTRATE 1 MG/ML IV SOLN: 5.0000 mg | Freq: Four times a day (QID) | INTRAVENOUS | Status: DC | PRN

## 2014-09-08 NOTE — Progress Notes (Addendum)
The patient arrived to 3E06 from the ED at 2100.  She was oriented to the unit and placed on telemetry.  CCMD was notified.  She is A&Ox4 and her VSS.  She is a 2x assist to turn in the bed and cannot ambulate.  She is incontinent.  She is complaining of 10/10 pain in her knees and received IV morphine.  Her call bell was explained and placed within reach and her bed alarm was turned on.

## 2014-09-08 NOTE — Progress Notes (Signed)
Noted pt's HR 142  this am at around 0749 prior to report from off going nurse .  Dr Sheran Fava notified and in to see pt.  Heart rate down 99-102.  EKG done as ordered.  Pt denies any pain/ discomfort.  Will continue to monitor.  Karie Kirks, RN

## 2014-09-08 NOTE — Progress Notes (Signed)
[facility] PROGRESS NOTE  TEARSA SOCK [id number] DOB: [birth year] DOA: 09/07/2014 PCP: No primary care provider on file.  Brief Summary  Sarah Colon is a 69 y.o. female with past medical history of hypertension, gout, diabetes mellitus, congestive heart failure (EF of 60-65% with grade 1 diastolic dysfunction), chronic kidney disease-stage III, history of septic arthritis, chronic leukocytosis, who presented with diarrhea, dysuria, bilateral knee pain.  She had been having 3-4 loose stools with mild abdominal discomfort, but no nausea or vomiting. She also reported dysuria and urinary incontinence.  She had become weak and had lay down on the floor where she stayed for several days before she was found.  She could not get up because of her knee arthritis and knee swelling.  She was found to have mild rhabdomyolysis and AKI with UTI.  She was started on ceftriaxone and is undergoing testing for diarrhea. On 4/10 she had an episode of sinus tachycardia after receiving hydralazine.  Assessment/Plan  Rhabdomyolysis: Total CK 3804. AST/ALT likely elevated due to rhabdo -  CPK trending down -  Continue IV fluids  AoCKD-III: Cr up from baseline 1.2-1.4 to 2.00. Likely due to UTI, dehydration, and rhabdomyolysis. - Creatinine trending down with IV fluids -  Renal ultrasound pending - Hold HCTZ, Mobic/NSAIDS  Sinus tachycardia, likely related to hydralazine.  Differential diagnosis also includes PE since she been on the floor for several days, ACS since she had some associated CP -  Spontaneously resolved -  D/c hydralazine -  Start scheduled carvedilol with metoprolol prn -  D-dimer, TSH, cycle troponins -  Cut off for VQ scan of D-dimer 700 (10 x age) -  ASA, BB -  Hold statin for now due to transaminitis -  NTG prn -  Continue IVF  Probable Gout -Continue colchicine -Solu-Medrol 60 mg 1, followed by prednisone 10 mg daily day 2 -Continue home oxycodone when necessary -  uric acid 17.4 - recommend allopurinol once clinically improved  UTI:  Present on admission -Rocephin IV -blood culture and urine culture pending  HTN:  -Hold HCTZ since patient has gout, HCTZ may increase uric acid level -Holder Modic, Valsartan and Lotensin due to AoCKD-III - Increase amlodipine dose from 5-10 mg daily -  D/c hydralazine -  Start carvedilol  Hypokalemia: Potassium 2.9, resolved with potassium supplementation -  magnesium level 2.3  Diabetes mellitus: A1c was 7.0 on 08/15/13. Mild hyperglycemic likely due to steroids -  Hold metformin -  Continue sliding scale insulin.  Diarrhea: -IV fluid as above -follow-up C diff pcr and GI pathogen panel  Chronic leukocytosis: Patient was seen in the past by Dr. Earlie Server on 05/16/13, thought this was likely reactive in nature secondary to infection and gouty arthritis. BCR/ABL and JAK-2 mutation tests were done. BCR/ABL is negative, I could not find the results of JAK-2 mutation test. Still has persistent leukocytosis. -may follow up with Dr. Julien Nordmann  Diastolic congestive heart failure: 2-D echo on 04/10/13 showed EF of 60-65% with grade 1 diastolic dysfunction. It is compensated on admission. Patient does not have leg edema.  Chronic normocytic anemia likely due to chronic disease -  Hemoglobin at baseline -  Followed by hematology  Diet:  Diabetic diet Access:  PIV IVF:  yes Proph:  Heparin  Code Status: Full Family Communication: Patient alone Disposition Plan: Pending improvement in her arthritis, diarrhea, urinary tract infection, rhabdo, further workup for tachycardia. Will need PT/OT evaluations.   Consultants:  None  Procedures:  Renal ultrasound  Echo  Antibiotics:  Ceftriaxone 4/9  HPI/Subjective:  Diarrhea slowing down. While eating breakfast this morning she suddenly had palpitations and nausea with some chest pressure. Telemetry demonstrated sinus tachycardia. Just prior to this event she  received hydralazine 10 mg by mouth, and other morning medications. Her EKG demonstrated some ST segment depression and T-wave inversions in the inferolateral leads with a rate of 161 bpm.  her heart rate tended down spontaneously and she began to feel better.  Objective: Filed Vitals:   09/07/14 2059 09/07/14 2359 09/08/14 0125 09/08/14 0704  BP: 140/62 145/63 106/60 133/68  Pulse: 82  78 68  Temp: 99.7 F (37.6 C)  99.4 F (37.4 C) 97.1 F (36.2 C)  TempSrc: Oral  Oral Oral  Resp: 16  16 16   Weight:   56.473 kg (124 lb 8 oz)   SpO2: 100%  100% 100%    Intake/Output Summary (Last 24 hours) at 09/08/14 0819 Last data filed at 09/08/14 0006  Gross per 24 hour  Intake    360 ml  Output      0 ml  Net    360 ml   Filed Weights   09/08/14 0125  Weight: 56.473 kg (124 lb 8 oz)    Exam:   General:  Adult female, No acute distress  HEENT:  NCAT, MMM  Cardiovascular:  RRR rate approximately 100 bpm, nl S1, S2, 2/6 systolic murmur at the right upper sternal border, 2+ pulses, warm extremities  Respiratory:  CTAB, no increased WOB  Abdomen:   Hyperactive BS, soft, NT/ND  MSK:   Normal tone and bulk, moderate sized bilateral knee effusions with some warmth and mild erythema of the right knee, 1+ bilateral pitting edema of bilateral feet, pain with palpation of knees and ankles and feet diffusely  Neuro:  Grossly intact  Data Reviewed: Basic Metabolic Panel:  Recent Labs Lab 09/07/14 1548 09/08/14 0515  NA 139 142  K 2.9* 4.3  CL 100 107  CO2 22 25  GLUCOSE 177* 234*  BUN 85* 64*  CREATININE 2.00* 1.66*  CALCIUM 9.3 8.7  MG  --  2.3  PHOS  --  3.5   Liver Function Tests:  Recent Labs Lab 09/07/14 1548 09/08/14 0515  AST 84* 55*  ALT 71* 58*  ALKPHOS 105 98  BILITOT 0.7 0.5  PROT 7.7 7.1  ALBUMIN 2.8* 2.3*   No results for input(s): LIPASE, AMYLASE in the last 168 hours. No results for input(s): AMMONIA in the last 168 hours. CBC:  Recent Labs Lab  09/07/14 1548 09/08/14 0515  WBC 14.9* 13.6*  NEUTROABS 11.6*  --   HGB 13.0 11.4*  HCT 37.2 33.3*  MCV 85.1 84.9  PLT 574* 583*   Cardiac Enzymes:  Recent Labs Lab 09/07/14 1548 09/08/14 0515  CKTOTAL 3804* 2293*  TROPONINI <0.03  --    BNP (last 3 results)  Recent Labs  09/08/14 0515  BNP 64.5    ProBNP (last 3 results) No results for input(s): PROBNP in the last 8760 hours.  CBG:  Recent Labs Lab 09/07/14 2214 09/08/14 0701  GLUCAP 131* 235*    Recent Results (from the past 240 hour(s))  Clostridium Difficile by PCR     Status: None   Collection Time: 09/07/14  4:00 PM  Result Value Ref Range Status   C difficile by pcr NEGATIVE NEGATIVE Final     Studies: Dg Chest 2 View  09/07/2014   CLINICAL DATA:  Fall.  EXAM: CHEST  2 VIEW  COMPARISON:  April 07, 2013.  FINDINGS: The heart size and mediastinal contours are within normal limits. Both lungs are clear. No pneumothorax or pleural effusion is noted. The visualized skeletal structures are unremarkable.  IMPRESSION: No active cardiopulmonary disease.   Electronically Signed   By: Marijo Conception, M.D.   On: 09/07/2014 15:37   Dg Knee Complete 4 Views Left  09/07/2014   CLINICAL DATA:  Acute left knee pain after fall.  Initial encounter.  EXAM: LEFT KNEE - COMPLETE 4+ VIEW  COMPARISON:  August 15, 2013.  FINDINGS: No fracture or dislocation is noted. Mild suprapatellar joint effusion is noted. Osteophyte formation is seen involving patella inferiorly in superiorly. Mild narrowing of medial joint space is noted.  IMPRESSION: Mild degenerative joint disease is noted medially. Mild suprapatellar joint effusion. No fracture or dislocation is noted.   Electronically Signed   By: Marijo Conception, M.D.   On: 09/07/2014 15:36   Dg Knee Complete 4 Views Right  09/07/2014   CLINICAL DATA:  Fall with right knee pain.  EXAM: RIGHT KNEE - COMPLETE 4+ VIEW  COMPARISON:  08/15/2013  FINDINGS: Diffuse decreased bone  mineralization. There are mild tricompartmental degenerative changes. No evidence of acute fracture or dislocation. No significant joint effusion.  IMPRESSION: No acute fracture.   Electronically Signed   By: Marin Olp M.D.   On: 09/07/2014 15:37    Scheduled Meds: . amLODipine  10 mg Oral Daily  . aspirin EC  325 mg Oral Daily  . brimonidine  1 drop Both Eyes TID  . carvedilol  6.25 mg Oral BID WC  . cefTRIAXone (ROCEPHIN)  IV  1 g Intravenous Q24H  . colchicine  0.6 mg Oral Daily  . dorzolamide-timolol  1 drop Both Eyes BID  . feeding supplement (GLUCERNA SHAKE)  237 mL Oral TID BM  . gabapentin  600 mg Oral QHS  . heparin  5,000 Units Subcutaneous 3 times per day  . insulin aspart  0-9 Units Subcutaneous TID WC  . latanoprost  1 drop Both Eyes QHS  . predniSONE  10 mg Oral Q breakfast  . sodium chloride  3 mL Intravenous Q12H   Continuous Infusions: . sodium chloride 125 mL/hr at 09/07/14 2331    Principal Problem:   Rhabdomyolysis Active Problems:   Type 2 diabetes mellitus with diabetic neuropathic arthropathy   Knee pain   Chronic diastolic heart failure   Leukocytosis   Hypertension   Physical deconditioning   Hypokalemia   Diarrhea   UTI (urinary tract infection)    Time spent: 30 min    Amato Sevillano, [facility] Pager [phone]. If 7PM-7AM, please contact night-coverage at [email or web address], password [facility] 09/08/2014, 8:19 AM  LOS: 1 day

## 2014-09-08 NOTE — Evaluation (Signed)
Physical Therapy Evaluation Patient Details Name: Sarah Colon MRN: [id number] DOB: May 24, 1946 Today's Date: 09/08/2014   History of Present Illness  69 y.o. female with past medical history of hypertension, gout, diabetes mellitus, congestive heart failure (EF of 60-65% with grade 1 diastolic dysfunction), chronic kidney disease-stage III, history of septic arthritis, chronic leukocytosis, who presents with diarrhea, dysuria, bilateral knee pain (possible gout flare up), found to have mild rhabdomyolysis and AKI with UTI.  Clinical Impression  Patient demonstrates deficits in functional mobility as indicated below. Will need continued skilled PT to address deficits and maximize function. Will see as indicated and progress as tolerated. At this time, patient unable to stand or ambulate, recommend ST SNF (patient frustrated with this recommendation).     Follow Up Recommendations SNF;Supervision/Assistance - 24 hour    Equipment Recommendations  None recommended by PT    Recommendations for Other Services       Precautions / Restrictions Precautions Precautions: Fall      Mobility  Bed Mobility Overal bed mobility: Needs Assistance Bed Mobility: Supine to Sit;Sit to Supine     Supine to sit: Mod assist Sit to supine: Mod assist   General bed mobility comments: Assist for trunk elevation and rotation, pt able to initate LE movement but unable to carry out completion to EOB with LEs secondary to weakness, assist for LE elevation to return to supine  Transfers Overall transfer level: Needs assistance Equipment used: Rolling walker (2 wheeled) Transfers: Sit to/from Stand Sit to Stand: Total assist         General transfer comment: patient attempted to come to standing, with max/total assist could not clear hips from bed, BLE buckling inability to power up  Ambulation/Gait             General Gait Details: unable to perform  Stairs            Wheelchair  Mobility    Modified Rankin (Stroke Patients Only)       Balance Overall balance assessment: Needs assistance Sitting-balance support: Feet supported Sitting balance-Leahy Scale: Fair     Standing balance support: Bilateral upper extremity supported;During functional activity Standing balance-Leahy Scale: Zero                               Pertinent Vitals/Pain Pain Assessment: No/denies pain    Home Living Family/patient expects to be discharged to:: Private residence Living Arrangements: Alone Available Help at Discharge: Family;Available PRN/intermittently Type of Home: Apartment Home Access: Stairs to enter Entrance Stairs-Rails: Right Entrance Stairs-Number of Steps: 3 Home Layout: One level Home Equipment: Walker - 2 wheels;Cane - single point      Prior Function Level of Independence: Independent with assistive device(s)         Comments: uses RW     Hand Dominance   Dominant Hand: Right    Extremity/Trunk Assessment   Upper Extremity Assessment: Generalized weakness           Lower Extremity Assessment: Generalized weakness (limited ROM secondary to swelling, BLE weakness 3/5 grossly)      Cervical / Trunk Assessment: Normal  Communication   Communication: No difficulties  Cognition Arousal/Alertness: Awake/alert Behavior During Therapy: WFL for tasks assessed/performed Overall Cognitive Status: Within Functional Limits for tasks assessed                      General Comments  Exercises        Assessment/Plan    PT Assessment Patient needs continued PT services  PT Diagnosis Difficulty walking;Generalized weakness   PT Problem List Decreased strength;Decreased range of motion;Decreased activity tolerance;Decreased balance;Decreased mobility;Cardiopulmonary status limiting activity  PT Treatment Interventions DME instruction;Gait training;Stair training;Functional mobility training;Therapeutic  activities;Therapeutic exercise;Balance training;Patient/family education   PT Goals (Current goals can be found in the Care Plan section) Acute Rehab PT Goals Patient Stated Goal: to go home PT Goal Formulation: With patient Time For Goal Achievement: 09/22/14 Potential to Achieve Goals: Fair    Frequency Min 3X/week   Barriers to discharge Decreased caregiver support lives alone    Co-evaluation               End of Session Equipment Utilized During Treatment: Gait belt Activity Tolerance: Patient limited by fatigue Patient left: in bed;with call bell/phone within reach;with bed alarm set Nurse Communication: Mobility status         Time: [id number] PT Time Calculation (min) (ACUTE ONLY): 20 min   Charges:   PT Evaluation $Initial PT Evaluation Tier I: 1 Procedure     PT G CodesDuncan Dull 09/23/14, 5:24 PM Alben Deeds, [place] DPT  [phone]

## 2014-09-08 NOTE — Progress Notes (Signed)
The patient had an uneventful night about 0745.  She was eating breakfast when her heart rate went as high as 164 sustained.  She looked lethargic and stated that she felt funny in her chest.  An EKG was obtained and VS were charted.  The MD, RT, and RR came to the bedside.

## 2014-09-09 ENCOUNTER — Inpatient Hospital Stay (HOSPITAL_COMMUNITY): Payer: Medicare Other

## 2014-09-09 DIAGNOSIS — I471 Supraventricular tachycardia: Secondary | ICD-10-CM

## 2014-09-09 DIAGNOSIS — R079 Chest pain, unspecified: Secondary | ICD-10-CM

## 2014-09-09 LAB — COMPREHENSIVE METABOLIC PANEL
ALT: 43 U/L — AB (ref 0–35)
AST: 33 U/L (ref 0–37)
Albumin: 2 g/dL — ABNORMAL LOW (ref 3.5–5.2)
Alkaline Phosphatase: 74 U/L (ref 39–117)
Anion gap: 6 (ref 5–15)
BUN: 45 mg/dL — ABNORMAL HIGH (ref 6–23)
CALCIUM: 8.2 mg/dL — AB (ref 8.4–10.5)
CO2: 24 mmol/L (ref 19–32)
Chloride: 113 mmol/L — ABNORMAL HIGH (ref 96–112)
Creatinine, Ser: 1.42 mg/dL — ABNORMAL HIGH (ref 0.50–1.10)
GFR calc Af Amer: 42 mL/min — ABNORMAL LOW (ref >90)
GFR calc non Af Amer: 36 mL/min — ABNORMAL LOW (ref >90)
Glucose, Bld: 265 mg/dL — ABNORMAL HIGH (ref 70–99)
Potassium: 4 mmol/L (ref 3.5–5.1)
Sodium: 143 mmol/L (ref 135–145)
TOTAL PROTEIN: 5.7 g/dL — AB (ref 6.0–8.3)
Total Bilirubin: 0.2 mg/dL — ABNORMAL LOW (ref 0.3–1.2)

## 2014-09-09 LAB — GLUCOSE, CAPILLARY
GLUCOSE-CAPILLARY: 218 mg/dL — AB (ref 70–99)
GLUCOSE-CAPILLARY: 197 mg/dL — AB (ref 70–99)
GLUCOSE-CAPILLARY: 188 mg/dL — AB (ref 70–99)
Glucose-Capillary: 221 mg/dL — ABNORMAL HIGH (ref 70–99)

## 2014-09-09 LAB — UREA NITROGEN, URINE: UREA NITROGEN UR: 698 mg/dL

## 2014-09-09 LAB — CBC
HEMATOCRIT: 29.2 % — AB (ref 36.0–46.0)
HEMOGLOBIN: 9.7 g/dL — AB (ref 12.0–15.0)
MCH: 28.6 pg (ref 26.0–34.0)
MCHC: 33.2 g/dL (ref 30.0–36.0)
MCV: 86.1 fL (ref 78.0–100.0)
Platelets: 546 10*3/uL — ABNORMAL HIGH (ref 150–400)
RBC: 3.39 MIL/uL — ABNORMAL LOW (ref 3.87–5.11)
RDW: 14.1 % (ref 11.5–15.5)
WBC: 17.5 10*3/uL — ABNORMAL HIGH (ref 4.0–10.5)

## 2014-09-09 LAB — URINE CULTURE

## 2014-09-09 LAB — CK: Total CK: 797 U/L — ABNORMAL HIGH (ref 7–177)

## 2014-09-09 MED ORDER — TECHNETIUM TC 99M DIETHYLENETRIAME-PENTAACETIC ACID: 40.0000 | Freq: Once | INTRAVENOUS | Status: AC | PRN

## 2014-09-09 MED ORDER — INSULIN ASPART 100 UNIT/ML ~~LOC~~ SOLN
0.0000 [IU] | Freq: Three times a day (TID) | SUBCUTANEOUS | Status: DC
  Administered 2014-09-09: 3 [IU] via SUBCUTANEOUS
  Administered 2014-09-09 (×2): 5 [IU] via SUBCUTANEOUS
  Administered 2014-09-10: 2 [IU] via SUBCUTANEOUS
  Administered 2014-09-10 (×2): 8 [IU] via SUBCUTANEOUS
  Administered 2014-09-11 (×2): 11 [IU] via SUBCUTANEOUS

## 2014-09-09 MED ORDER — INSULIN ASPART 100 UNIT/ML ~~LOC~~ SOLN: 0.0000 [IU] | Freq: Every day | SUBCUTANEOUS | Status: DC

## 2014-09-09 MED ORDER — INSULIN DETEMIR 100 UNIT/ML ~~LOC~~ SOLN
10.0000 [IU] | Freq: Every day | SUBCUTANEOUS | Status: DC
  Administered 2014-09-09 – 2014-09-11 (×3): 10 [IU] via SUBCUTANEOUS
  Filled 2014-09-09 (×4): qty 0.1

## 2014-09-09 MED ORDER — ALLOPURINOL 100 MG PO TABS
100.0000 mg | ORAL_TABLET | Freq: Every day | ORAL | Status: DC
  Administered 2014-09-09 – 2014-09-11 (×3): 100 mg via ORAL
  Filled 2014-09-09 (×5): qty 1

## 2014-09-09 MED ORDER — TECHNETIUM TO 99M ALBUMIN AGGREGATED
6.0000 | Freq: Once | INTRAVENOUS | Status: AC | PRN
Start: 2014-09-09 — End: 2014-09-09

## 2014-09-09 NOTE — Progress Notes (Signed)
[facility] PROGRESS NOTE  LYCIA PATE [id number] DOB: 09/27/1945 DOA: 09/07/2014 PCP: No primary care provider on file.  Brief Summary  Sarah Colon is a 69 y.o. female with past medical history of hypertension, gout, diabetes mellitus, congestive heart failure (EF of 60-65% with grade 1 diastolic dysfunction), chronic kidney disease-stage III, history of septic arthritis, chronic leukocytosis, who presented with diarrhea, dysuria, bilateral knee pain.  She had been having 3-4 loose stools with mild abdominal discomfort, but no nausea or vomiting. She also reported dysuria and urinary incontinence.  She had become weak and had lay down on the floor where she stayed for several days before she was found.  She could not get up because of her knee arthritis and knee swelling.  She was found to have mild rhabdomyolysis and AKI with UTI.  She was started on ceftriaxone and is undergoing testing for diarrhea. On 4/10 she had an episode of sinus tachycardia after receiving hydralazine.  No recurrence. She starting to feel better.  Assessment/Plan  Rhabdomyolysis: Total CK 3804. AST/ALT likely elevated due to rhabdo -  CPK trending down -  Reduce but continue IV fluids  AoCKD-III: Cr peak at 2, now back to baseline of 1.4. Likely due to UTI, dehydration, and rhabdomyolysis. - Creatinine improved with IV fluids -  Renal ultrasound consistent with medical renal disease - Hold HCTZ, Mobic/NSAIDS  Sinus tachycardia, likely related to hydralazine.  Differential diagnosis also includes PE since she been on the floor for several days, ACS since she had some associated CP -  Spontaneously resolved -  Telemetry: Normal sinus rhythm -  Discontinued hydralazine -  Start scheduled carvedilol with metoprolol prn -  D-dimer elevated, VQ scan pending -  TSH 0.694 -  Troponins negative -  Continue ASA, BB  Probable Gout, with pain in feet and knees, swelling and warmth improved. -Continue  colchicine -Solu-Medrol 60 mg 1, followed by prednisone 10 mg daily day 2 -Continue home oxycodone when necessary - uric acid 17.4 - start allopurinol   UTI:  Present on admission, dysuria improving -Rocephin IV -blood culture and urine culture pending  HTN, blood pressure well controlled - discontinue HCTZ at discharge since patient has gout and CKD - Increase amlodipine dose from 5-10 mg daily - Started carvedilol  Hypokalemia: Potassium 2.9, resolved with potassium supplementation -  magnesium level 2.3  Diabetes mellitus: A1c was 7.0 on 08/15/13. Hyperglycemic due to steroids -  Hold metformin -  Increase sliding scale insulin -  Add levemir   Diarrhea, likely due to UTI and resolved.  None in 2 days.  C. Diff PCR neg - d/c enteric precautions  Chronic leukocytosis: Patient was seen in the past by Dr. Earlie Server on 05/16/13, thought this was likely reactive in nature secondary to infection and gouty arthritis. BCR/ABL and JAK-2 mutation tests were done. BCR/ABL is negative, I could not find the results of JAK-2 mutation test. Still has persistent leukocytosis. -may follow up with Dr. Julien Nordmann  Diastolic congestive heart failure: 2-D echo on 04/10/13 showed EF of 60-65% with grade 1 diastolic dysfunction. It is compensated on admission. Patient does not have leg edema.  Chronic normocytic anemia likely due to chronic disease -  Hemoglobin at baseline -  Followed by hematology  Diet:  Diabetic diet Access:  PIV IVF:  yes Proph:  Heparin  Code Status: Full Family Communication: Patient alone Disposition Plan:  Likely to skilled nursing facility tomorrow if eating better, VQ scan negative   Consultants:  None  Procedures:  Renal ultrasound  Echo  Antibiotics:  Ceftriaxone 4/9  HPI/Subjective:  No further diarrhea. Heart rate stable. Did not eat much at all yesterday, but is going to try to eat breakfast this morning. Persistent nausea. Dysuria improving.  Voiding frequently.  Objective: Filed Vitals:   09/08/14 2151 09/09/14 0040 09/09/14 0500 09/09/14 0706  BP: 128/57 110/58  120/67  Pulse: 67 80  67  Temp: 98.6 F (37 C) 97.9 F (36.6 C)  97.8 F (36.6 C)  TempSrc: Oral Oral  Oral  Resp: 18 18  18   Weight:   61 kg (134 lb 7.7 oz)   SpO2: 100% 100%  100%    Intake/Output Summary (Last 24 hours) at 09/09/14 0819 Last data filed at 09/09/14 0800  Gross per 24 hour  Intake   1980 ml  Output      0 ml  Net   1980 ml   Filed Weights   09/08/14 0125 09/09/14 0500  Weight: 56.473 kg (124 lb 8 oz) 61 kg (134 lb 7.7 oz)    Exam:   General:  Adult female, No acute distress  HEENT:  NCAT, MMM  Cardiovascular:  RRR, nl S1, S2, 2/6 systolic murmur at the right upper sternal border  Respiratory:  CTAB, no increased WOB  Abdomen:   Normal active BS, soft, NT/ND  MSK:   Normal tone and bulk, decreased bilateral knee effusions no longer warm or erythematous knees, 1+ bilateral pitting and nonpitting edema of bilateral feet, pain with palpation of knees and ankles and feet diffusely  Neuro:  Grossly intact  Data Reviewed: Basic Metabolic Panel:  Recent Labs Lab 09/07/14 1548 09/08/14 0515 09/09/14 0329  NA 139 142 143  K 2.9* 4.3 4.0  CL 100 107 113*  CO2 22 25 24   GLUCOSE 177* 234* 265*  BUN 85* 64* 45*  CREATININE 2.00* 1.66* 1.42*  CALCIUM 9.3 8.7 8.2*  MG  --  2.3  --   PHOS  --  3.5  --    Liver Function Tests:  Recent Labs Lab 09/07/14 1548 09/08/14 0515 09/09/14 0329  AST 84* 55* 33  ALT 71* 58* 43*  ALKPHOS 105 98 74  BILITOT 0.7 0.5 0.2*  PROT 7.7 7.1 5.7*  ALBUMIN 2.8* 2.3* 2.0*   No results for input(s): LIPASE, AMYLASE in the last 168 hours. No results for input(s): AMMONIA in the last 168 hours. CBC:  Recent Labs Lab 09/07/14 1548 09/08/14 0515 09/09/14 0329  WBC 14.9* 13.6* 17.5*  NEUTROABS 11.6*  --   --   HGB 13.0 11.4* 9.7*  HCT 37.2 33.3* 29.2*  MCV 85.1 84.9 86.1  PLT 574*  583* 546*   Cardiac Enzymes:  Recent Labs Lab 09/07/14 1548 09/08/14 0515 09/08/14 1518 09/08/14 2130 09/09/14 0329  CKTOTAL 3804* 2293*  --   --  797*  TROPONINI <0.03  --  <0.03  <0.03 0.03  --    BNP (last 3 results)  Recent Labs  09/08/14 0515  BNP 64.5    ProBNP (last 3 results) No results for input(s): PROBNP in the last 8760 hours.  CBG:  Recent Labs Lab 09/08/14 0701 09/08/14 1125 09/08/14 1607 09/08/14 2033 09/09/14 0654  GLUCAP 235* 317* 346* 272* 221*    Recent Results (from the past 240 hour(s))  Blood culture (routine x 2)     Status: None (Preliminary result)   Collection Time: 09/07/14  2:20 PM  Result Value Ref Range Status  Specimen Description RIGHT ANTECUBITAL  Final   Special Requests BOTTLES DRAWN AEROBIC AND ANAEROBIC 5CC  Final   Culture   Final           BLOOD CULTURE RECEIVED NO GROWTH TO DATE CULTURE WILL BE HELD FOR 5 DAYS BEFORE ISSUING A FINAL NEGATIVE REPORT Performed at [facility]    Report Status PENDING  Incomplete  Blood culture (routine x 2)     Status: None (Preliminary result)   Collection Time: 09/07/14  2:25 PM  Result Value Ref Range Status   Specimen Description LEFT ANTECUBITAL  Final   Special Requests BOTTLES DRAWN AEROBIC AND ANAEROBIC 5CC  Final   Culture   Final           BLOOD CULTURE RECEIVED NO GROWTH TO DATE CULTURE WILL BE HELD FOR 5 DAYS BEFORE ISSUING A FINAL NEGATIVE REPORT Performed at [facility]    Report Status PENDING  Incomplete  Clostridium Difficile by PCR     Status: None   Collection Time: 09/07/14  4:00 PM  Result Value Ref Range Status   C difficile by pcr NEGATIVE NEGATIVE Final     Studies: Dg Chest 2 View  09/07/2014   CLINICAL DATA:  Fall.  EXAM: CHEST  2 VIEW  COMPARISON:  April 07, 2013.  FINDINGS: The heart size and mediastinal contours are within normal limits. Both lungs are clear. No pneumothorax or pleural effusion is noted. The visualized skeletal  structures are unremarkable.  IMPRESSION: No active cardiopulmonary disease.   Electronically Signed   By: Marijo Conception, M.D.   On: 09/07/2014 15:37   US Renal  09/08/2014   CLINICAL DATA:  69 year old female with acute renal failure.  EXAM: RENAL/URINARY TRACT ULTRASOUND COMPLETE  COMPARISON:  None.  FINDINGS: Right Kidney:  Length: 8.2 cm. Mildly increased echogenicity noted. There is no evidence of solid mass or hydronephrosis.  Left Kidney:  Length: 8.8 cm. Mildly increased echogenicity noted. There is no evidence of solid mass or hydronephrosis.  Bladder:  Appears normal for degree of bladder distention.  IMPRESSION: Small slightly echogenic kidneys compatible with chronic medical renal disease. No evidence of hydronephrosis.   Electronically Signed   By: Margarette Canada M.D.   On: 09/08/2014 12:41   Dg Knee Complete 4 Views Left  09/07/2014   CLINICAL DATA:  Acute left knee pain after fall.  Initial encounter.  EXAM: LEFT KNEE - COMPLETE 4+ VIEW  COMPARISON:  August 15, 2013.  FINDINGS: No fracture or dislocation is noted. Mild suprapatellar joint effusion is noted. Osteophyte formation is seen involving patella inferiorly in superiorly. Mild narrowing of medial joint space is noted.  IMPRESSION: Mild degenerative joint disease is noted medially. Mild suprapatellar joint effusion. No fracture or dislocation is noted.   Electronically Signed   By: Marijo Conception, M.D.   On: 09/07/2014 15:36   Dg Knee Complete 4 Views Right  09/07/2014   CLINICAL DATA:  Fall with right knee pain.  EXAM: RIGHT KNEE - COMPLETE 4+ VIEW  COMPARISON:  08/15/2013  FINDINGS: Diffuse decreased bone mineralization. There are mild tricompartmental degenerative changes. No evidence of acute fracture or dislocation. No significant joint effusion.  IMPRESSION: No acute fracture.   Electronically Signed   By: Marin Olp M.D.   On: 09/07/2014 15:37    Scheduled Meds: . amLODipine  10 mg Oral Daily  . aspirin EC  325 mg Oral Daily   . brimonidine  1 drop Both  Eyes TID  . carvedilol  6.25 mg Oral BID WC  . cefTRIAXone (ROCEPHIN)  IV  1 g Intravenous Q24H  . colchicine  0.6 mg Oral Daily  . dorzolamide-timolol  1 drop Both Eyes BID  . feeding supplement (GLUCERNA SHAKE)  237 mL Oral TID BM  . gabapentin  600 mg Oral QHS  . heparin  5,000 Units Subcutaneous 3 times per day  . insulin aspart  0-9 Units Subcutaneous TID WC  . latanoprost  1 drop Both Eyes QHS  . predniSONE  10 mg Oral Q breakfast  . sodium chloride  3 mL Intravenous Q12H   Continuous Infusions: . sodium chloride 125 mL/hr at 09/09/14 0126    Principal Problem:   Non-traumatic rhabdomyolysis Active Problems:   Type 2 diabetes mellitus with diabetic neuropathic arthropathy   Knee pain   Chronic diastolic heart failure   Leukocytosis   Hypertension   Physical deconditioning   Hypokalemia   Diarrhea   UTI (urinary tract infection)   Sinus tachycardia    Time spent: 30 min    Johnna Bollier, [place] Pager [phone]. If 7PM-7AM, please contact night-coverage at [email or web address], password [facility] 09/09/2014, 8:19 AM  LOS: 2 days

## 2014-09-09 NOTE — Care Management Note (Signed)
    Page 1 of 1   09/11/2014     3:51:50 PM CARE MANAGEMENT NOTE 09/11/2014  Patient:  Sarah Colon, Sarah Colon   Account Number:  [id number]  Date Initiated:  09/09/2014  Documentation initiated by:  Rebacca Votaw  Subjective/Objective Assessment:   Pt adm on 09/07/14 with rhabdomyolosis.  PTA, pt lived at home alone.     Action/Plan:   PT/OT recommending SNF at dc.  CSW consulted to facilitate dc to SNF when medically stable.   Anticipated DC Date:  09/11/2014   Anticipated DC Plan:  [place]  CM consult      Choice offered to / List presented to:             Status of service:  Completed, signed off Medicare Important Message given?  YES (If response is "NO", the following Medicare IM given date fields will be blank) Date Medicare IM given:  09/10/2014 Medicare IM given by:  Ruairi Stutsman Date Additional Medicare IM given:   Additional Medicare IM given by:    Discharge Disposition:  [place]  Per UR Regulation:  Reviewed for med. necessity/level of care/duration of stay  If discussed at [place] of Stay Meetings, dates discussed:    Comments:  09/11/14 Ellan Lambert, RN, BSN [phone] Pt discharged to SNF on 4/13, per CSW arrangements.

## 2014-09-09 NOTE — Evaluation (Signed)
Occupational Therapy Evaluation Patient Details Name: Sarah Colon MRN: [id number] DOB: 1946/01/17 Today's Date: 09/09/2014    History of Present Illness 69 y.o. female with past medical history of hypertension, gout, diabetes mellitus, congestive heart failure (EF of 60-65% with grade 1 diastolic dysfunction), chronic kidney disease-stage III, history of septic arthritis, chronic leukocytosis, who presents with diarrhea, dysuria, bilateral knee pain (possible gout flare up), found to have mild rhabdomyolysis and AKI with UTI.   Clinical Impression   This 69 yo female admitted with above presents to acute OT with increased pain and decreased mobility all affecting her ability to care for herself at home alone. She will benefit from acute OT with follow up OT at SNF to get back to as independent as possible.    Follow Up Recommendations  SNF    Equipment Recommendations   (TBD at next venue)       Precautions / Restrictions Precautions Precautions: Fall Restrictions Weight Bearing Restrictions: No      Mobility Bed Mobility               General bed mobility comments: Pt mod A to scoot herself up in bed, she said she could not sit up on the EOB because she had been to several tests this AM and her pain was a 10 (5 minutes later she told the nurse it was a 3--when I questioned her about this she said, "I did--well it was that bad a little while ago"            ADL Overall ADL's : Needs assistance/impaired Eating/Feeding: Independent;Bed level   Grooming: Set up;Bed level   Upper Body Bathing: Set up;Bed level   Lower Body Bathing: Maximal assistance;Bed level   Upper Body Dressing : Minimal assistance;Bed level   Lower Body Dressing: Total assistance;Bed level                       Vision Additional Comments: No change from baseline          Pertinent Vitals/Pain Pain Assessment: 0-10 Pain Score: 10-Worst pain ever (however not 5 minutes  later she reported her pain at a 3 to her nurse) Pain Location: bil LEs Pain Descriptors / Indicators: Aching;Sore Pain Intervention(s): Monitored during session     Hand Dominance Right   Extremity/Trunk Assessment Upper Extremity Assessment Upper Extremity Assessment: Overall WFL for tasks assessed   Lower Extremity Assessment Lower Extremity Assessment: Defer to PT evaluation       Communication Communication Communication: No difficulties   Cognition Arousal/Alertness: Awake/alert Behavior During Therapy: WFL for tasks assessed/performed Overall Cognitive Status: Within Functional Limits for tasks assessed                                Home Living Family/patient expects to be discharged to:: Skilled nursing facility Living Arrangements: Alone Available Help at Discharge: Family;Available PRN/intermittently Type of Home: Apartment Home Access: Stairs to enter Entrance Stairs-Number of Steps: 3 Entrance Stairs-Rails: Right Home Layout: One level               Home Equipment: Walker - 2 wheels;Cane - single point          Prior Functioning/Environment Level of Independence: Independent with assistive device(s)        Comments: uses RW    OT Diagnosis: Generalized weakness;Acute pain   OT Problem List: Decreased strength;Decreased range of motion;Pain;Decreased knowledge  of use of DME or AE;Decreased activity tolerance;Impaired balance (sitting and/or standing)   OT Treatment/Interventions:      OT Goals(Current goals can be found in the care plan section) Acute Rehab OT Goals OT Goal Formulation: With patient Time For Goal Achievement: 09/23/14 Potential to Achieve Goals: Good  OT Frequency:                End of Session Nurse Communication:  (aware of the different numbers that pt had given for pain)  Activity Tolerance: Patient limited by pain Patient left: in bed;with call bell/phone within reach;with nursing/sitter in room    Time: [id number] OT Time Calculation (min): 12 min Charges:  OT General Charges $OT Visit: 1 Procedure OT Treatments $Self Care/Home Management : 8-22 mins  Almon Register [id number] 09/09/2014, 1:55 PM

## 2014-09-09 NOTE — Progress Notes (Signed)
  Echocardiogram 2D Echocardiogram has been performed.  Sarah Colon 09/09/2014, 12:00 PM

## 2014-09-10 ENCOUNTER — Inpatient Hospital Stay (HOSPITAL_COMMUNITY): Payer: Medicare Other

## 2014-09-10 ENCOUNTER — Encounter (HOSPITAL_COMMUNITY): Payer: Self-pay | Admitting: General Practice

## 2014-09-10 LAB — CK: CK TOTAL: 275 U/L — AB (ref 7–177)

## 2014-09-10 LAB — GLUCOSE, CAPILLARY
GLUCOSE-CAPILLARY: 256 mg/dL — AB (ref 70–99)
Glucose-Capillary: 126 mg/dL — ABNORMAL HIGH (ref 70–99)
Glucose-Capillary: 252 mg/dL — ABNORMAL HIGH (ref 70–99)
Glucose-Capillary: 170 mg/dL — ABNORMAL HIGH (ref 70–99)

## 2014-09-10 LAB — COMPREHENSIVE METABOLIC PANEL
ALBUMIN: 2.1 g/dL — AB (ref 3.5–5.2)
ALK PHOS: 72 U/L (ref 39–117)
ALT: 44 U/L — ABNORMAL HIGH (ref 0–35)
AST: 30 U/L (ref 0–37)
Anion gap: 12 (ref 5–15)
BILIRUBIN TOTAL: 0.2 mg/dL — AB (ref 0.3–1.2)
BUN: 34 mg/dL — ABNORMAL HIGH (ref 6–23)
CO2: 22 mmol/L (ref 19–32)
Calcium: 8.7 mg/dL (ref 8.4–10.5)
Chloride: 110 mmol/L (ref 96–112)
Creatinine, Ser: 1.21 mg/dL — ABNORMAL HIGH (ref 0.50–1.10)
GFR calc Af Amer: 51 mL/min — ABNORMAL LOW (ref >90)
GFR, EST NON AFRICAN AMERICAN: 44 mL/min — AB (ref >90)
Glucose, Bld: 151 mg/dL — ABNORMAL HIGH (ref 70–99)
Potassium: 4 mmol/L (ref 3.5–5.1)
SODIUM: 144 mmol/L (ref 135–145)
Total Protein: 5.9 g/dL — ABNORMAL LOW (ref 6.0–8.3)

## 2014-09-10 LAB — CBC
HEMATOCRIT: 30.2 % — AB (ref 36.0–46.0)
Hemoglobin: 10.1 g/dL — ABNORMAL LOW (ref 12.0–15.0)
MCH: 28.9 pg (ref 26.0–34.0)
MCHC: 33.4 g/dL (ref 30.0–36.0)
MCV: 86.3 fL (ref 78.0–100.0)
Platelets: 628 10*3/uL — ABNORMAL HIGH (ref 150–400)
RBC: 3.5 MIL/uL — ABNORMAL LOW (ref 3.87–5.11)
RDW: 14.4 % (ref 11.5–15.5)
WBC: 17 10*3/uL — AB (ref 4.0–10.5)

## 2014-09-10 MED ORDER — AMLODIPINE BESYLATE 10 MG PO TABS: 10.0000 mg | ORAL_TABLET | Freq: Every day | ORAL | Status: DC

## 2014-09-10 MED ORDER — GABAPENTIN 600 MG PO TABS: 600.0000 mg | ORAL_TABLET | Freq: Two times a day (BID) | ORAL | Status: DC

## 2014-09-10 MED ORDER — TRAMADOL HCL 50 MG PO TABS: 50.0000 mg | ORAL_TABLET | Freq: Three times a day (TID) | ORAL | Status: DC | PRN

## 2014-09-10 MED ORDER — PREDNISONE 10 MG PO TABS: 10.0000 mg | ORAL_TABLET | Freq: Every day | ORAL | Status: DC

## 2014-09-10 MED ORDER — CIPROFLOXACIN HCL 250 MG PO TABS
250.0000 mg | ORAL_TABLET | Freq: Two times a day (BID) | ORAL | Status: DC
  Administered 2014-09-10 – 2014-09-11 (×3): 250 mg via ORAL
  Filled 2014-09-10 (×5): qty 1

## 2014-09-10 MED ORDER — PREDNISONE 20 MG PO TABS
20.0000 mg | ORAL_TABLET | Freq: Every day | ORAL | Status: DC
  Filled 2014-09-10: qty 1

## 2014-09-10 MED ORDER — OXYCODONE HCL 5 MG PO TABS: 5.0000 mg | ORAL_TABLET | ORAL | Status: DC | PRN

## 2014-09-10 MED ORDER — BUPIVACAINE HCL 0.25 % IJ SOLN
20.0000 mL | Freq: Once | INTRAMUSCULAR | Status: DC
  Filled 2014-09-10: qty 20

## 2014-09-10 MED ORDER — SODIUM CHLORIDE 0.9 % IV SOLN
INTRAVENOUS | Status: DC
Start: 2014-09-10 — End: 2014-09-11
  Administered 2014-09-10 (×2): via INTRAVENOUS

## 2014-09-10 MED ORDER — COLCHICINE 0.6 MG PO TABS
1.2000 mg | ORAL_TABLET | Freq: Once | ORAL | Status: AC
  Administered 2014-09-10: 1.2 mg via ORAL
  Filled 2014-09-10: qty 2

## 2014-09-10 MED ORDER — METHYLPREDNISOLONE ACETATE 40 MG/ML IJ SUSP
80.0000 mg | INTRAMUSCULAR | Status: AC
  Filled 2014-09-10 (×2): qty 2

## 2014-09-10 MED ORDER — CIPROFLOXACIN HCL 250 MG PO TABS: 250.0000 mg | ORAL_TABLET | Freq: Two times a day (BID) | ORAL | Status: DC

## 2014-09-10 MED ORDER — GLIPIZIDE 5 MG PO TABS: 2.5000 mg | ORAL_TABLET | Freq: Every day | ORAL | Status: DC

## 2014-09-10 MED ORDER — PREDNISONE 10 MG PO TABS
10.0000 mg | ORAL_TABLET | Freq: Every day | ORAL | Status: DC
  Administered 2014-09-11: 10 mg via ORAL
  Filled 2014-09-10 (×2): qty 1

## 2014-09-10 MED ORDER — CARVEDILOL 6.25 MG PO TABS: 6.2500 mg | ORAL_TABLET | Freq: Two times a day (BID) | ORAL | Status: DC

## 2014-09-10 MED ORDER — ALLOPURINOL 100 MG PO TABS: 100.0000 mg | ORAL_TABLET | Freq: Every day | ORAL | Status: DC

## 2014-09-10 NOTE — Progress Notes (Signed)
1800 Visited by Dr Lorin Mercy.

## 2014-09-10 NOTE — Progress Notes (Signed)
Bed offers provided to patient, daughter Britt Boozer and her brother- Wiley.  They have chosen [facility] as facility of choice.  DC to SNF anticipated for tomorrow per Dr. Sheran Fava.  Lorie [place]. Pauline Good, [place]

## 2014-09-10 NOTE — Clinical Social Work Placement (Deleted)
Clinical Social Work Department BRIEF PSYCHOSOCIAL ASSESSMENT 09/10/2014  Patient:  Sarah Colon, Sarah Colon     Account Number:  [id number]     Admit date:  09/07/2014  Clinical Social Worker:  Rock Sobol, [place]  Date/Time:  09/10/2014 09:33 AM  Referred by:  Physician  Date Referred:  09/10/2014 Referred for  Psychosocial assessment   Other Referral:   Interview type:  Patient Other interview type:   BSW intern spoke with pt at bedside.    PSYCHOSOCIAL DATA Living Status:  pt currently lives ALONE and is agreeable for SNF placement.  Admitted from facility:   Level of care:  [place] Primary support name:  Carolynn Sayers Primary support relationship to patient:  CHILD, ADULT Degree of support available:   Britt Boozer pt's daughter- [phone]  Intern tried to call daughter but no answer, will try again later.    CURRENT CONCERNS Current Concerns  Post-Acute Placement   Other Concerns:    SOCIAL WORK ASSESSMENT / PLAN BSW  Intern spoke with pt at bedside regarding d/c plan and SNF. Pt stated that she has been to [facility] before about 1.5-2 years ago for 3 months. Pt stated that she does not want to go to [facility] again. Pt states that she is agreeable for SNF placement within [facility]. She told me to contact her daughter to update regarding info. Daughter called but no answer, voicemail option not available. Pt stated that she would like to have a private room and she would like a tv and telephone within her room at SNF.    Assessment/plan status:   Other assessment/ plan:   Information/referral to community resources:   SNF list given to pt.    PATIENT'S/FAMILY'S RESPONSE TO PLAN OF CARE: Pt was very nice and motivated. She stated that she wanted to get better for herself. She states that she has good family support.

## 2014-09-10 NOTE — Discharge Summary (Addendum)
Physician Discharge Summary  Sarah Colon [id number] DOB: 1946-03-23 DOA: 09/07/2014  PCP: No PCP Per Patient  Admit date: 09/07/2014 Discharge date:  09/11/14   Recommendations for Outpatient Follow-up:  1. Transfer to skilled nursing facility for ongoing physical and occupational therapy 2. Follow up with Dr. Julien Nordmann in 1 month for repeat CBC and ongoing management of chronic leukocytosis, anemia, and thrombocytosis 3. CMP and CPK in 1 week to follow up rhabdo, creatinine 4. Continue prednisone through 4/17, then stop 5. Continue ciprofloxacin through 4/16, then stop 6. Encourage fluids 7. Daily weights and if she gains more than 3-lbs in one day or 5-lbs from the time of discharge from hospital, please notify supervising physician 8. Follow up with Dr. Lorin Mercy in 2-3 weeks  Discharge Diagnoses:  Principal Problem:   Non-traumatic rhabdomyolysis Active Problems:   Type 2 diabetes mellitus with diabetic neuropathic arthropathy   Knee pain   Chronic diastolic heart failure   Leukocytosis   Hypertension   Physical deconditioning   Hypokalemia   Diarrhea   UTI (urinary tract infection)   Sinus tachycardia   Discharge Condition: Stable, improved  Diet recommendation: Diabetic, healthy heart  Wt Readings from Last 3 Encounters:  09/11/14 59.603 kg (131 lb 6.4 oz)  08/15/13 57.153 kg (126 lb)  05/16/13 58.242 kg (128 lb 6.4 oz)    History of present illness:  Sarah Colon is a 69 y.o. female with past medical history of hypertension, gout, diabetes mellitus, congestive heart failure (EF of 60-65% with grade 1 diastolic dysfunction), chronic kidney disease-stage III, history of septic arthritis, chronic leukocytosis, who presented with diarrhea, dysuria, bilateral knee pain. She had been having 3-4 loose stools with mild abdominal discomfort, but no nausea or vomiting. She also reported dysuria and urinary incontinence. She had become weak and had lay down on the floor  where she stayed for until she was found. She could not get up because of her knee arthritis and knee swelling. She was found to have mild rhabdomyolysis with a CK of 3804 and AKI with a creatinine of 2 with UTI. She was started on ceftriaxone and IV fluids. On 4/10 she had an episode of sinus tachycardia after receiving hydralazine. No recurrence.  She had clinical improvement with IV fluids but continued to have some dysuria. Her urine culture grew mixed flora.  Hospital Course:   Rhabdomyolysis: Total CK 3804. AST/ALT likely elevated due to rhabdo - CPK trended down to almost normal with IV fluids -  Liver function tests also almost back to normal limits by the time of discharge  AoCKD-III: Cr peak at 2, now back to baseline of 1.4. Likely due to UTI, dehydration, and rhabdomyolysis. - Creatinine trended down to 1.2 with IV fluids  - Renal ultrasound consistent with medical renal disease - Recommend continuing to hold HCTZ, Mobic/NSAIDS at discharge  Sinus tachycardia, likely related to hydralazine.Lasted for several minutes and self resolved. Differential diagnosis included PE since she been on the floor for several days, ACS since she had some associated CP - Telemetry: Normal sinus rhythm with episode of sinus tachycardia - Her as needed hydralazine was discontinued and she had no recurrence - She was started on carvedilol with metoprolol prn - Her D-dimer was elevated, but VQ scan was negative - TSH 0.694 - Troponins were negative - She continued ASA, BB  Gout flare, with pain in feet and knees, persistent swelling and warmth despite steroids -Continue colchicine, give additional dose of 1.2mg  today -She was  given Solu-Medrol 60 mg 1, followed by prednisone 10 mg daily (to minimize hyperglycemia) - increase to prednisone 20mg  daily -She continued her home oxycodone - uric acid 17.4, started on allopurinol since already on treatment for gout flare anyway - orthopedics  consultation:  Underwent marcaine/depomedrol injections in both knees on 4/12 -  PT/OT recommending SNF  UTI: Present on admission, dysuria improving but still present -  She received 3 days of ceftriaxone but because she continued to have dysuria and her urine culture was mixed, she was transitioned to ciprofloxacin to complete a seven-day course. -blood culture no growth to date -  Chronic dysuria, recommend that her daughter help her schedule an appointment with the urologist  HTN, blood pressure well controlled - discontinue HCTZ at discharge since patient has gout and CKD - Increased amlodipine dose from 5-10 mg daily - Started carvedilol  Hypokalemia: Potassium 2.9, resolved with potassium supplementation - magnesium level 2.3  Diabetes mellitus: A1c was 7.0 on 08/15/13. Hyperglycemic due to steroids - Held metformin due to acute kidney injury, her creatinine is borderline anyway so recommend discontinuing for now - She was given sliding scale insulin and levemir during hospitalization -  Started glipizide at discharge -  She will need a repeat hemoglobin A1c in approximately 2 months  Diarrhea, likely due to UTI and resolved. None in the last 3 days. C. Diff PCR neg  Chronic leukocytosis: Patient was seen in the past by Dr. Earlie Server on 05/16/13, thought this was likely reactive in nature secondary to infection and gouty arthritis. BCR/ABL and JAK-2 mutation tests were done. BCR/ABL is negative, I could not find the results of JAK-2 mutation test. Still has persistent leukocytosis. -follow up with Dr. Julien Nordmann a routine visit  Diastolic congestive heart failure: 2-D echo on 04/10/13 showed EF of 60-65% with grade 1 diastolic dysfunction. It was compensated.    Chronic normocytic anemia likely due to chronic disease - Hemoglobin at baseline - Followed by hematology  Consultants:  None  Procedures:  Renal ultrasound  Echo  Antibiotics:  Ceftriaxone  4/9  Discharge Exam: Filed Vitals:   09/11/14 0930  BP: 156/73  Pulse: 79  Temp: 98 F (36.7 C)  Resp: 20   Filed Vitals:   09/10/14 2204 09/11/14 0543 09/11/14 0707 09/11/14 0930  BP: 145/59 153/70  156/73  Pulse: 72 74  79  Temp: 97.7 F (36.5 C) 98 F (36.7 C)  98 F (36.7 C)  TempSrc: Oral   Oral  Resp: 18 20  20   Weight:   59.603 kg (131 lb 6.4 oz)   SpO2: 100% 100%  97%   States her appetite has improved.  She had a knee pain in the right knee last night which is starting to get better. Persistent dysuria that is improving somewhat.   General: Adult female, No acute distress,   HEENT: NCAT, MMM  Cardiovascular: RRR, nl S1, S2, 2/6 systolic murmur at the right upper sternal border  Respiratory: CTAB, no increased WOB  Abdomen: Normal active BS, soft, NT/ND  MSK: Normal tone and bulk, decreased but persistent moderate bilateral knee effusions with mild warmth and no erythema, pain with ROM, 1+ bilateral pitting and nonpitting edema of bilateral feet  Neuro: Grossly intact  Discharge Instructions      Discharge Instructions    (HEART FAILURE PATIENTS) Call MD:  Anytime you have any of the following symptoms: 1) 3 pound weight gain in 24 hours or 5 pounds in 1 week 2) shortness of  breath, with or without a dry hacking cough 3) swelling in the hands, feet or stomach 4) if you have to sleep on extra pillows at night in order to breathe.    Complete by:  As directed      Call MD for:  difficulty breathing, headache or visual disturbances    Complete by:  As directed      Call MD for:  extreme fatigue    Complete by:  As directed      Call MD for:  hives    Complete by:  As directed      Call MD for:  persistant dizziness or light-headedness    Complete by:  As directed      Call MD for:  persistant nausea and vomiting    Complete by:  As directed      Call MD for:  severe uncontrolled pain    Complete by:  As directed      Call MD for:  temperature  >100.4    Complete by:  As directed      Diet - low sodium heart healthy    Complete by:  As directed      Diet Carb Modified    Complete by:  As directed      Increase activity slowly    Complete by:  As directed             Medication List    STOP taking these medications        benazepril 40 MG tablet  Commonly known as:  LOTENSIN     hydrochlorothiazide 12.5 MG capsule  Commonly known as:  MICROZIDE     meloxicam 15 MG tablet  Commonly known as:  MOBIC     meloxicam 7.5 MG tablet  Commonly known as:  MOBIC     metFORMIN 500 MG 24 hr tablet  Commonly known as:  GLUCOPHAGE-XR     valsartan-hydrochlorothiazide 320-25 MG per tablet  Commonly known as:  DIOVAN-HCT      TAKE these medications        acetaminophen 325 MG tablet  Commonly known as:  TYLENOL  Take 2 tablets (650 mg total) by mouth every 6 (six) hours as needed for mild pain (or Fever >/= 101).     allopurinol 100 MG tablet  Commonly known as:  ZYLOPRIM  Take 1 tablet (100 mg total) by mouth daily.     amLODipine 10 MG tablet  Commonly known as:  NORVASC  Take 1 tablet (10 mg total) by mouth daily.     bimatoprost 0.01 % Soln  Commonly known as:  LUMIGAN  Place 1 drop into both eyes at bedtime.     brimonidine 0.1 % Soln  Commonly known as:  ALPHAGAN P  Place 1 drop into both eyes 3 (three) times daily.     carvedilol 6.25 MG tablet  Commonly known as:  COREG  Take 1 tablet (6.25 mg total) by mouth 2 (two) times daily with a meal.     ciprofloxacin 250 MG tablet  Commonly known as:  CIPRO  Take 1 tablet (250 mg total) by mouth 2 (two) times daily.     colchicine 0.6 MG tablet  Commonly known as:  COLCRYS  Take 1 tablet (0.6 mg total) by mouth daily. Take 2 tablets as needed for gout flair     dorzolamide-timolol 22.3-6.8 MG/ML ophthalmic solution  Commonly known as:  COSOPT  Place 1 drop into both eyes 2 (two)  times daily.     feeding supplement (GLUCERNA SHAKE) Liqd  Take 237 mLs  by mouth 3 (three) times daily between meals.     gabapentin 600 MG tablet  Commonly known as:  NEURONTIN  Take 1 tablet (600 mg total) by mouth 2 (two) times daily.     glipiZIDE 5 MG tablet  Commonly known as:  GLUCOTROL  Take 0.5 tablets (2.5 mg total) by mouth daily before breakfast.     oxyCODONE 5 MG immediate release tablet  Commonly known as:  Oxy IR/ROXICODONE  Take 1 tablet (5 mg total) by mouth every 4 (four) hours as needed for severe pain. Take one tablet by mouth every 6 hours as needed for pain     predniSONE 10 MG tablet  Commonly known as:  DELTASONE  Take 1 tablet (10 mg total) by mouth daily with breakfast.     traMADol 50 MG tablet  Commonly known as:  ULTRAM  Take 1 tablet (50 mg total) by mouth 3 (three) times daily as needed for moderate pain.       Follow-up Information    Follow up with [facility] K., MD In 1 month.   Specialty:  Oncology   Contact information:   [address] [place] [address] [phone]       Follow up with Marybelle Killings, MD In 2 weeks.   Specialty:  Orthopedic Surgery   Contact information:   [place] [address] [phone]        The results of significant diagnostics from this hospitalization (including imaging, microbiology, ancillary and laboratory) are listed below for reference.    Significant Diagnostic Studies: Dg Chest 2 View  09/07/2014   CLINICAL DATA:  Fall.  EXAM: CHEST  2 VIEW  COMPARISON:  April 07, 2013.  FINDINGS: The heart size and mediastinal contours are within normal limits. Both lungs are clear. No pneumothorax or pleural effusion is noted. The visualized skeletal structures are unremarkable.  IMPRESSION: No active cardiopulmonary disease.   Electronically Signed   By: Marijo Conception, M.D.   On: 09/07/2014 15:37   Dg Knee 1-2 Views Left  09/10/2014   CLINICAL DATA:  Left knee pain  EXAM: LEFT KNEE - 1-2 VIEW  COMPARISON:  09/07/2014  FINDINGS: Mild degenerative  changes are noted in the medial joint space and patellofemoral space. No joint effusion is seen. No soft tissue abnormality is seen. No fracture is noted.  IMPRESSION: Degenerative change without acute abnormality.   Electronically Signed   By: Inez Catalina M.D.   On: 09/10/2014 13:16   Dg Knee 1-2 Views Right  09/10/2014   CLINICAL DATA:  Right knee pain, initial encounter  EXAM: RIGHT KNEE - 1-2 VIEW  COMPARISON:  09/07/2014  FINDINGS: No acute fracture or dislocation is noted. Mild degenerative changes are noted in the medial joint space and patellofemoral space. A small joint effusion is seen. No other soft tissue abnormality is noted.  IMPRESSION: Mild degenerative change with small joint effusion.   Electronically Signed   By: Inez Catalina M.D.   On: 09/10/2014 13:13   US Renal  09/08/2014   CLINICAL DATA:  69 year old female with acute renal failure.  EXAM: RENAL/URINARY TRACT ULTRASOUND COMPLETE  COMPARISON:  None.  FINDINGS: Right Kidney:  Length: 8.2 cm. Mildly increased echogenicity noted. There is no evidence of solid mass or hydronephrosis.  Left Kidney:  Length: 8.8 cm. Mildly increased echogenicity noted. There is no evidence of  solid mass or hydronephrosis.  Bladder:  Appears normal for degree of bladder distention.  IMPRESSION: Small slightly echogenic kidneys compatible with chronic medical renal disease. No evidence of hydronephrosis.   Electronically Signed   By: Margarette Canada M.D.   On: 09/08/2014 12:41   Nm Pulmonary Perf And Vent  09/09/2014   CLINICAL DATA:  Substernal chest pain. Episodic shortness of breath.  EXAM: NUCLEAR MEDICINE VENTILATION - PERFUSION LUNG SCAN  TECHNIQUE: Ventilation images were obtained in multiple projections using inhaled aerosol technetium 99 M DTPA. Perfusion images were obtained in multiple projections after intravenous injection of Tc-55m MAA.  RADIOPHARMACEUTICALS:  40.0 mCi Tc-43m DTPA aerosol and 6.0 mCi Tc-66m MAA  COMPARISON:  Two-view chest x-ray  09/07/2014.  FINDINGS: Ventilation: There is clumping of the radiopharmaceutical at the hila bilaterally.  Perfusion: No wedge shaped peripheral perfusion defects to suggest acute pulmonary embolism.  IMPRESSION: No evidence for pulmonary embolus.  The perfusion images are normal.  The ventilation images are nondiagnostic as there is clumping of radiopharmaceutical at the hila.   Electronically Signed   By: San Morelle M.D.   On: 09/09/2014 13:28   Dg Knee Complete 4 Views Left  09/07/2014   CLINICAL DATA:  Acute left knee pain after fall.  Initial encounter.  EXAM: LEFT KNEE - COMPLETE 4+ VIEW  COMPARISON:  August 15, 2013.  FINDINGS: No fracture or dislocation is noted. Mild suprapatellar joint effusion is noted. Osteophyte formation is seen involving patella inferiorly in superiorly. Mild narrowing of medial joint space is noted.  IMPRESSION: Mild degenerative joint disease is noted medially. Mild suprapatellar joint effusion. No fracture or dislocation is noted.   Electronically Signed   By: Marijo Conception, M.D.   On: 09/07/2014 15:36   Dg Knee Complete 4 Views Right  09/07/2014   CLINICAL DATA:  Fall with right knee pain.  EXAM: RIGHT KNEE - COMPLETE 4+ VIEW  COMPARISON:  08/15/2013  FINDINGS: Diffuse decreased bone mineralization. There are mild tricompartmental degenerative changes. No evidence of acute fracture or dislocation. No significant joint effusion.  IMPRESSION: No acute fracture.   Electronically Signed   By: Marin Olp M.D.   On: 09/07/2014 15:37    Microbiology: Recent Results (from the past 240 hour(s))  Blood culture (routine x 2)     Status: None (Preliminary result)   Collection Time: 09/07/14  2:20 PM  Result Value Ref Range Status   Specimen Description RIGHT ANTECUBITAL  Final   Special Requests BOTTLES DRAWN AEROBIC AND ANAEROBIC 5CC  Final   Culture   Final           BLOOD CULTURE RECEIVED NO GROWTH TO DATE CULTURE WILL BE HELD FOR 5 DAYS BEFORE ISSUING A FINAL  NEGATIVE REPORT Performed at [facility]    Report Status PENDING  Incomplete  Blood culture (routine x 2)     Status: None (Preliminary result)   Collection Time: 09/07/14  2:25 PM  Result Value Ref Range Status   Specimen Description LEFT ANTECUBITAL  Final   Special Requests BOTTLES DRAWN AEROBIC AND ANAEROBIC 5CC  Final   Culture   Final           BLOOD CULTURE RECEIVED NO GROWTH TO DATE CULTURE WILL BE HELD FOR 5 DAYS BEFORE ISSUING A FINAL NEGATIVE REPORT Performed at [facility]    Report Status PENDING  Incomplete  Clostridium Difficile by PCR     Status: None   Collection Time: 09/07/14  4:00 PM  Result Value Ref Range Status   C difficile by pcr NEGATIVE NEGATIVE Final  Urine culture     Status: None   Collection Time: 09/08/14  1:36 AM  Result Value Ref Range Status   Specimen Description URINE, CLEAN CATCH  Final   Special Requests NONE  Final   Colony Count   Final    60,000 COLONIES/ML Performed at [facility]    Culture   Final    Multiple bacterial morphotypes present, none predominant. Suggest appropriate recollection if clinically indicated. Performed at [facility]    Report Status 09/09/2014 FINAL  Final     Labs: Basic Metabolic Panel:  Recent Labs Lab 09/07/14 1548 09/08/14 0515 09/09/14 0329 09/10/14 0500  NA 139 142 143 144  K 2.9* 4.3 4.0 4.0  CL 100 107 113* 110  CO2 22 25 24 22   GLUCOSE 177* 234* 265* 151*  BUN 85* 64* 45* 34*  CREATININE 2.00* 1.66* 1.42* 1.21*  CALCIUM 9.3 8.7 8.2* 8.7  MG  --  2.3  --   --   PHOS  --  3.5  --   --    Liver Function Tests:  Recent Labs Lab 09/07/14 1548 09/08/14 0515 09/09/14 0329 09/10/14 0500  AST 84* 55* 33 30  ALT 71* 58* 43* 44*  ALKPHOS 105 98 74 72  BILITOT 0.7 0.5 0.2* 0.2*  PROT 7.7 7.1 5.7* 5.9*  ALBUMIN 2.8* 2.3* 2.0* 2.1*   No results for input(s): LIPASE, AMYLASE in the last 168 hours. No results for input(s): AMMONIA in the last 168  hours. CBC:  Recent Labs Lab 09/07/14 1548 09/08/14 0515 09/09/14 0329 09/10/14 0500  WBC 14.9* 13.6* 17.5* 17.0*  NEUTROABS 11.6*  --   --   --   HGB 13.0 11.4* 9.7* 10.1*  HCT 37.2 33.3* 29.2* 30.2*  MCV 85.1 84.9 86.1 86.3  PLT 574* 583* 546* 628*   Cardiac Enzymes:  Recent Labs Lab 09/07/14 1548 09/08/14 0515 09/08/14 1518 09/08/14 2130 09/09/14 0329 09/10/14 0500  CKTOTAL 3804* 2293*  --   --  797* 275*  TROPONINI <0.03  --  <0.03  <0.03 0.03  --   --    BNP: BNP (last 3 results)  Recent Labs  09/08/14 0515  BNP 64.5    ProBNP (last 3 results) No results for input(s): PROBNP in the last 8760 hours.  CBG:  Recent Labs Lab 09/10/14 0657 09/10/14 1135 09/10/14 1547 09/10/14 2151 09/11/14 0536  GLUCAP 126* 252* 256* 170* 305*    Time coordinating discharge: 35 minutes  Signed:  CHIU, STEPHEN K  [facility] 09/11/2014, 10:29 AM

## 2014-09-10 NOTE — Consult Note (Signed)
Patient ID: Sarah Colon MRN: [id number] DOB/AGE: [birth year] 69 y.o.  Admit date: 09/07/2014  Admission Diagnoses:  Principal Problem:   Non-traumatic rhabdomyolysis Active Problems:   Type 2 diabetes mellitus with diabetic neuropathic arthropathy   Knee pain   Chronic diastolic heart failure   Leukocytosis   Hypertension   Physical deconditioning   Hypokalemia   Diarrhea   UTI (urinary tract infection)   Sinus tachycardia   HPI: Patient is being seen for severe bilateral knee pain.  Known hx of gout.  Pain started about 3 days ago.  Unable to ambulate.  No injury.  Patient brought to hospital by EMS.  Serum uric acid level 17 a couple of days ago.  Continues to have ongoing pain and still has not ambulated since her admission.  Hx  Type 2 dm.    Past Medical History: Past Medical History  Diagnosis Date  . Arthritis   . Diabetes mellitus without complication   . Chronic pain of left knee   . Gout dx 04/09/2013    knee tapped by ortho with monosodium urate crystals  . Hypertension     Surgical History: Past Surgical History  Procedure Laterality Date  . Skin grafts    . Knee aspiration Bilateral 08/15/2013    DR Eliseo Squires    Family History: History reviewed. No pertinent family history.  Social History: History   Social History  . Marital Status: Widowed    Spouse Name: N/A  . Number of Children: N/A  . Years of Education: N/A   Occupational History  . Not on file.   Social History Main Topics  . Smoking status: Former Smoker    Quit date: 05/31/1997  . Smokeless tobacco: Never Used  . Alcohol Use: No  . Drug Use: No  . Sexual Activity: Not on file   Other Topics Concern  . Not on file   Social History Narrative    Allergies: Codeine  Medications: I have reviewed the patient's current medications.  Vital Signs: Patient Vitals for the past 24 hrs:  BP Temp Temp src Pulse Resp SpO2 Weight  09/10/14 1317 123/60 mmHg 98 F (36.7 C) Oral 88 20  99 % -  09/10/14 1100 (!) 158/67 mmHg - - 82 - 98 % -  09/10/14 0513 (!) 122/52 mmHg 97.8 F (36.6 C) Oral 65 18 98 % 60.6 kg (133 lb 9.6 oz)  09/09/14 2010 (!) 130/55 mmHg 97.8 F (36.6 C) Oral 63 20 99 % -  09/09/14 1748 (!) 118/54 mmHg 98.2 F (36.8 C) Oral 66 18 99 % -    Radiology: Dg Chest 2 View  09/07/2014   CLINICAL DATA:  Fall.  EXAM: CHEST  2 VIEW  COMPARISON:  April 07, 2013.  FINDINGS: The heart size and mediastinal contours are within normal limits. Both lungs are clear. No pneumothorax or pleural effusion is noted. The visualized skeletal structures are unremarkable.  IMPRESSION: No active cardiopulmonary disease.   Electronically Signed   By: Marijo Conception, M.D.   On: 09/07/2014 15:37   Dg Knee 1-2 Views Left  09/10/2014   CLINICAL DATA:  Left knee pain  EXAM: LEFT KNEE - 1-2 VIEW  COMPARISON:  09/07/2014  FINDINGS: Mild degenerative changes are noted in the medial joint space and patellofemoral space. No joint effusion is seen. No soft tissue abnormality is seen. No fracture is noted.  IMPRESSION: Degenerative change without acute abnormality.   Electronically Signed   By: Linus Mako.D.  On: 09/10/2014 13:16   Dg Knee 1-2 Views Right  09/10/2014   CLINICAL DATA:  Right knee pain, initial encounter  EXAM: RIGHT KNEE - 1-2 VIEW  COMPARISON:  09/07/2014  FINDINGS: No acute fracture or dislocation is noted. Mild degenerative changes are noted in the medial joint space and patellofemoral space. A small joint effusion is seen. No other soft tissue abnormality is noted.  IMPRESSION: Mild degenerative change with small joint effusion.   Electronically Signed   By: Inez Catalina M.D.   On: 09/10/2014 13:13   US Renal  09/08/2014   CLINICAL DATA:  69 year old female with acute renal failure.  EXAM: RENAL/URINARY TRACT ULTRASOUND COMPLETE  COMPARISON:  None.  FINDINGS: Right Kidney:  Length: 8.2 cm. Mildly increased echogenicity noted. There is no evidence of solid mass or  hydronephrosis.  Left Kidney:  Length: 8.8 cm. Mildly increased echogenicity noted. There is no evidence of solid mass or hydronephrosis.  Bladder:  Appears normal for degree of bladder distention.  IMPRESSION: Small slightly echogenic kidneys compatible with chronic medical renal disease. No evidence of hydronephrosis.   Electronically Signed   By: Margarette Canada M.D.   On: 09/08/2014 12:41   Nm Pulmonary Perf And Vent  09/09/2014   CLINICAL DATA:  Substernal chest pain. Episodic shortness of breath.  EXAM: NUCLEAR MEDICINE VENTILATION - PERFUSION LUNG SCAN  TECHNIQUE: Ventilation images were obtained in multiple projections using inhaled aerosol technetium 99 M DTPA. Perfusion images were obtained in multiple projections after intravenous injection of Tc-20m MAA.  RADIOPHARMACEUTICALS:  40.0 mCi Tc-9m DTPA aerosol and 6.0 mCi Tc-63m MAA  COMPARISON:  Two-view chest x-ray 09/07/2014.  FINDINGS: Ventilation: There is clumping of the radiopharmaceutical at the hila bilaterally.  Perfusion: No wedge shaped peripheral perfusion defects to suggest acute pulmonary embolism.  IMPRESSION: No evidence for pulmonary embolus.  The perfusion images are normal.  The ventilation images are nondiagnostic as there is clumping of radiopharmaceutical at the hila.   Electronically Signed   By: San Morelle M.D.   On: 09/09/2014 13:28   Dg Knee Complete 4 Views Left  09/07/2014   CLINICAL DATA:  Acute left knee pain after fall.  Initial encounter.  EXAM: LEFT KNEE - COMPLETE 4+ VIEW  COMPARISON:  August 15, 2013.  FINDINGS: No fracture or dislocation is noted. Mild suprapatellar joint effusion is noted. Osteophyte formation is seen involving patella inferiorly in superiorly. Mild narrowing of medial joint space is noted.  IMPRESSION: Mild degenerative joint disease is noted medially. Mild suprapatellar joint effusion. No fracture or dislocation is noted.   Electronically Signed   By: Marijo Conception, M.D.   On: 09/07/2014  15:36   Dg Knee Complete 4 Views Right  09/07/2014   CLINICAL DATA:  Fall with right knee pain.  EXAM: RIGHT KNEE - COMPLETE 4+ VIEW  COMPARISON:  08/15/2013  FINDINGS: Diffuse decreased bone mineralization. There are mild tricompartmental degenerative changes. No evidence of acute fracture or dislocation. No significant joint effusion.  IMPRESSION: No acute fracture.   Electronically Signed   By: Marin Olp M.D.   On: 09/07/2014 15:37    Labs:  Recent Labs  09/09/14 0329 09/10/14 0500  WBC 17.5* 17.0*  RBC 3.39* 3.50*  HCT 29.2* 30.2*  PLT 546* 628*    Recent Labs  09/09/14 0329 09/10/14 0500  NA 143 144  K 4.0 4.0  CL 113* 110  CO2 24 22  BUN 45* 34*  CREATININE 1.42* 1.21*  GLUCOSE 265*  151*  CALCIUM 8.2* 8.7    Recent Labs  09/07/14 1548  INR 1.20    Review of Systems: Review of Systems  Constitutional: Negative.   Gastrointestinal: Negative.   Musculoskeletal: Positive for joint pain.    Physical Exam: Alert and oriented.  Neg log roll bilat hips.  Bilateral knees right greater than left swelling.  Diffusely tender.  Limited range of motion.  No signs of infection.  Bilateral calves nontender.  NVI.     Assessment and Plan: Bilateral knee pain secondary to gout.   Type 2 dm Diagnosis and treatment plan discussed with my attending Dr Lorin Mercy.  We elected to try conservative treatment with injections.  After patient consent bilat knees were prepped with betadine, then bilateral intra-articular marcaine/depomedrol 6:1 injections were done from a superolateral patellar approach.  Tolerated procedure well and without complication.  After a couple of minutes patient reported good relief with marcaine in place.  Range of motion was also improved. Ok to get up and ambulate with assistance.  F/u in office with Dr Lorin Mercy in 2-3 weeks for recheck.    Alyson Locket. Ricard Dillon PA-C For Rodell Perna MD [phone]  Injections done. She states now a few hours after the injection  she has not gotten any relief. We will follow.

## 2014-09-10 NOTE — Psychosocial Assessment (Signed)
Clinical Social Work Department BRIEF PSYCHOSOCIAL ASSESSMENT 09/10/2014  Patient:  Sarah Colon, Sarah Colon     Account Number:  [id number]     Admit date:  09/07/2014  Clinical Social Worker:  Candelario Steppe, [place]  Date/Time:  09/10/2014 09:33 AM  Referred by:  Physician  Date Referred:  09/10/2014 Referred for  Psychosocial assessment   Other Referral:   Interview type:  Patient Other interview type:   BSW intern spoke with pt at bedside.    PSYCHOSOCIAL DATA Living Status:  pt currently lives ALONE and is agreeable for SNF placement.  Admitted from facility:   Level of care:  [place] Primary support name:  Carolynn Sayers Primary support relationship to patient:  CHILD, ADULT Degree of support available:   Britt Boozer pt's daughter- [phone]  Intern tried to call daughter but no answer, will try again later.    CURRENT CONCERNS Current Concerns  Post-Acute Placement   Other Concerns:    SOCIAL WORK ASSESSMENT / PLAN BSW  Intern spoke with pt at bedside regarding d/c plan and SNF. Pt stated that she has been to [facility] before about 1.5-2 years ago for 3 months. Pt stated that she does not want to go to [facility] again. Pt states that she is agreeable for SNF placement within [facility]. She told me to contact her daughter to update regarding info. Daughter called but no answer, voicemail option not available. Pt stated that she would like to have a private room and she would like a tv and telephone within her room at SNF.    Assessment/plan status:   Other assessment/ plan:   Information/referral to community resources:   SNF list given to pt.    PATIENT'S/FAMILY'S RESPONSE TO PLAN OF CARE: Pt was very nice and motivated. She stated that she wanted to get better for herself. She states that she has good family support.

## 2014-09-10 NOTE — Clinical Social Work Placement (Addendum)
     Clinical Social Work Department CLINICAL SOCIAL WORK PLACEMENT NOTE 09/11/2014  Patient:  Sarah Colon, Sarah Colon  Account Number:  [id number] Admit date:  09/07/2014  Clinical Social Worker:  Aldona Bar DOLLARHITE, CLINICAL SOCIAL WORKER  Date/time:  09/10/2014 09:52 AM  Clinical Social Work is seeking post-discharge placement for this patient at the following level of care:   [place]   (*CSW will update this form in Epic as items are completed)   09/10/2014  Patient/family provided with [place] Department of Clinical Social Works list of facilities offering this level of care within the geographic area requested by the patient (or if unable, by the patients family).  09/10/2014  Patient/family informed of their freedom to choose among providers that offer the needed level of care, that participate in Medicare, Medicaid or managed care program needed by the patient, have an available bed and are willing to accept the patient.  09/10/2014  Patient/family informed of [facility] ownership interest in [facility], as well as of the fact that they are under no obligation to receive care at this facility.  PASARR submitted to EDS on 09/10/2014 PASARR number received on 09/10/2014  FL2 transmitted to all facilities in geographic area requested by pt/family on  09/10/2014 FL2 transmitted to all facilities within larger geographic area on 09/10/2014  Patient informed that his/her managed care company has contracts with or will negotiate with  certain facilities, including the following:   [facility]- Medicare Complete     Patient/family informed of bed offers received:  09/10/2014 Patient [facility], [name] Physician recommends and patient chooses bed at    Patient to be transferred to [place] on  09/11/2014 Patient to be transferred to facility by [facility]) Patient and family notified of transfer on  09/11/2014 Name of family member notified:  Deloris Environmental consultant- Daughter  The following physician request were entered in Epic: Physician Request  Please sign FL2.    Additional Comments: 09/11/14  Ok per MD for d/c today to SNF for (hopefully) short term rehab.  Patient relayed that her furniture has been placed in storage by her daughter as she is unable to live alone at this time. She is hopeful to complete rehab so that she can eventually become independent enough to get her own senior apartment.  Patient was agreeable to placement but expressed some reluctance to going to SNF. She asked many appropriate questions.  She has a strong feel of loosing her independence but also has concerns that her daughter is moving to [place] soon and she will not have strong family supportin the area. Patient was able to verbalize that she understand the reason for SNF rehab and stated she felt better after CSW's intervention and support.  Nursing notified to call report and EMS contacted.  No further CSW needs identified and CSW will sign off.  Lorie Phenix. [place], [name]

## 2014-09-10 NOTE — Progress Notes (Signed)
1500 Dr Lorin Mercy called in with orders. PA Benjiman Core came in injected Depomedrol to each knee under Bupicaine  Anesthesia into knees. Pt tolerated procedure well. Knees more moble with ease

## 2014-09-10 NOTE — Progress Notes (Signed)
1100 Pt  Complaint of intense pain both knees like she had " fluids previously ". Dr Sheran Fava notified and evaluated the pt . Recalled orthopedic consult .

## 2014-09-10 NOTE — Progress Notes (Signed)
PT Cancellation Note  Patient Details Name: RAESHA BORIN MRN: [id number] DOB: [birth year]   Cancelled Treatment:    Reason Eval/Treat Not Completed: Patient declined, no reason specified (pt states pain in bil knees 10/10 and declined any movement or participation at this time)   Melford Aase 09/10/2014, 12:14 PM Elwyn Reach, [place]

## 2014-09-10 NOTE — Clinical Social Work Placement (Signed)
Clinical Social Work Department CLINICAL SOCIAL WORK PLACEMENT NOTE 09/10/2014  Patient:  Sarah Colon, Sarah Colon  Account Number:  [id number] Admit date:  09/07/2014  Clinical Social Worker:  Aldona Bar Jazzman Loughmiller, CLINICAL SOCIAL WORKER  Date/time:  09/10/2014 09:52 AM  Clinical Social Work is seeking post-discharge placement for this patient at the following level of care:   [place]   (*CSW will update this form in Epic as items are completed)   09/10/2014  Patient/family provided with [place] Department of Clinical Social Work's list of facilities offering this level of care within the geographic area requested by the patient (or if unable, by the patient's family).  09/10/2014  Patient/family informed of their freedom to choose among providers that offer the needed level of care, that participate in Medicare, Medicaid or managed care program needed by the patient, have an available bed and are willing to accept the patient.  09/10/2014  Patient/family informed of [facility]' ownership interest in [facility], as well as of the fact that they are under no obligation to receive care at this facility.  PASARR submitted to EDS on 09/10/2014 PASARR number received on 09/10/2014  FL2 transmitted to all facilities in geographic area requested by pt/family on  09/10/2014 FL2 transmitted to all facilities within larger geographic area on 09/10/2014  Patient informed that his/her managed care company has contracts with or will negotiate with  certain facilities, including the following:     Patient/family informed of bed offers received:   Patient chooses bed at  Physician recommends and patient chooses bed at    Patient to be transferred to  on   Patient to be transferred to facility by  Patient and family notified of transfer on  Name of family member notified:    The following physician request were entered in Epic: Physician Request  Please sign FL2.     Additional Comments:

## 2014-09-11 DIAGNOSIS — M1000 Idiopathic gout, unspecified site: Secondary | ICD-10-CM

## 2014-09-11 DIAGNOSIS — M25562 Pain in left knee: Secondary | ICD-10-CM | POA: Diagnosis not present

## 2014-09-11 DIAGNOSIS — R5381 Other malaise: Secondary | ICD-10-CM | POA: Diagnosis not present

## 2014-09-11 DIAGNOSIS — N390 Urinary tract infection, site not specified: Secondary | ICD-10-CM | POA: Diagnosis not present

## 2014-09-11 DIAGNOSIS — D72829 Elevated white blood cell count, unspecified: Secondary | ICD-10-CM | POA: Diagnosis not present

## 2014-09-11 DIAGNOSIS — D649 Anemia, unspecified: Secondary | ICD-10-CM | POA: Diagnosis not present

## 2014-09-11 DIAGNOSIS — M25561 Pain in right knee: Secondary | ICD-10-CM | POA: Diagnosis not present

## 2014-09-11 DIAGNOSIS — E11610 Type 2 diabetes mellitus with diabetic neuropathic arthropathy: Secondary | ICD-10-CM | POA: Diagnosis not present

## 2014-09-11 DIAGNOSIS — M009 Pyogenic arthritis, unspecified: Secondary | ICD-10-CM | POA: Diagnosis not present

## 2014-09-11 DIAGNOSIS — M6282 Rhabdomyolysis: Secondary | ICD-10-CM | POA: Diagnosis not present

## 2014-09-11 DIAGNOSIS — I5033 Acute on chronic diastolic (congestive) heart failure: Secondary | ICD-10-CM | POA: Diagnosis not present

## 2014-09-11 DIAGNOSIS — D72828 Other elevated white blood cell count: Secondary | ICD-10-CM | POA: Diagnosis not present

## 2014-09-11 DIAGNOSIS — I10 Essential (primary) hypertension: Secondary | ICD-10-CM | POA: Diagnosis not present

## 2014-09-11 DIAGNOSIS — M25569 Pain in unspecified knee: Secondary | ICD-10-CM | POA: Diagnosis not present

## 2014-09-11 DIAGNOSIS — N183 Chronic kidney disease, stage 3 (moderate): Secondary | ICD-10-CM | POA: Diagnosis not present

## 2014-09-11 DIAGNOSIS — I5032 Chronic diastolic (congestive) heart failure: Secondary | ICD-10-CM | POA: Diagnosis not present

## 2014-09-11 DIAGNOSIS — E876 Hypokalemia: Secondary | ICD-10-CM | POA: Diagnosis not present

## 2014-09-11 LAB — GLUCOSE, CAPILLARY
GLUCOSE-CAPILLARY: 305 mg/dL — AB (ref 70–99)
Glucose-Capillary: 303 mg/dL — ABNORMAL HIGH (ref 70–99)

## 2014-09-11 NOTE — Plan of Care (Signed)
Problem: Phase I Progression Outcomes Goal: OOB as tolerated unless otherwise ordered Outcome: Not Progressing Patient states is not able to stand

## 2014-09-11 NOTE — Progress Notes (Signed)
[facility] PROGRESS NOTE  Sarah Colon [id number] DOB: 11/09/1945 DOA: 09/07/2014 PCP: No PCP Per Patient  Assessment/Plan: Rhabdomyolysis:  -  AST/ALT likely elevated due to rhabdo - CPK trended down with IV fluids - Liver function tests also almost back to normal  AoCKD-III:  -Cr peak at 2, now back to baseline of 1.4. Likely due to UTI, dehydration, and rhabdomyolysis  Sinus tachycardia,  -likely related to hydralazine. - Telemetry: Normal sinus rhythm with episode of sinus tachycardia - She was started on carvedilol with metoprolol prn - Her D-dimer was elevated, but VQ scan was negative - She continued ASA, BB  Gout flare,  - with pain in feet and knees, persistent swelling and warmth despite steroids -Continue colchicine -She was given Solu-Medrol 60 mg 1, followed by prednisone 10 mg daily (to minimize hyperglycemia) -She continued her home oxycodone - uric acid 17.4, started on allopurinol - Underwent marcaine/depomedrol injections in both knees on 4/12 - PT/OT recommending SNF  UTI:  - Present on admission, dysuria improving but still present - She received 3 days of ceftriaxone but because she continued to have dysuria and her urine culture was mixed, she was transitioned to ciprofloxacin to complete a seven-day course. -blood culture no growth to date - Chronic dysuria, recommend that her daughter help her schedule an appointment with the urologist  HTN,  - blood pressure well controlled - discontinue HCTZ at discharge since patient has gout and CKD - Increased amlodipine dose from 5-10 mg daily - Had started carvedilol  Hypokalemia:  - Resolved with potassium supplementation - magnesium level 2.3  Diabetes mellitus:  - A1c was 7.0 on 08/15/13. Hyperglycemic due to steroids - Held metformin due to acute kidney injury, her creatinine is borderline anyway so recommend discontinuing for now - She was given sliding scale insulin and  levemir during hospitalization - Started glipizide at discharge - She will need a repeat hemoglobin A1c in approximately 2 months  Diarrhea,  -likely due to UTI and resolved. None in the last 3 days.  -C. Diff PCR neg  Chronic leukocytosis:  -Patient was seen in the past by Dr. Earlie Server on 05/16/13, thought this was likely reactive in nature secondary to infection and gouty arthritis. BCR/ABL and JAK-2 mutation tests were done. BCR/ABL is negative. Still has persistent leukocytosis. -follow up with Dr. Julien Nordmann a routine visit  Diastolic congestive heart failure:  - 2-D echo on 04/10/13 showed EF of 60-65% with grade 1 diastolic dysfunction.  Chronic normocytic anemia  - likely due to chronic disease - Hemoglobin at baseline - Followed by hematology  Consultants:  Orthopedic surgery  Procedures:  Renal ultrasound  Echo  Antibiotics:  Ceftriaxone 4/9   HPI/Subjective: Still with joint pains, albeit improved  Objective: Filed Vitals:   09/10/14 2204 09/11/14 0543 09/11/14 0707 09/11/14 0930  BP: 145/59 153/70  156/73  Pulse: 72 74  79  Temp: 97.7 F (36.5 C) 98 F (36.7 C)  98 F (36.7 C)  TempSrc: Oral   Oral  Resp: 18 20  20   Weight:   59.603 kg (131 lb 6.4 oz)   SpO2: 100% 100%  97%    Intake/Output Summary (Last 24 hours) at 09/11/14 1032 Last data filed at 09/11/14 0900  Gross per 24 hour  Intake  567.5 ml  Output      0 ml  Net  567.5 ml   Filed Weights   09/09/14 0500 09/10/14 0513 09/11/14 0707  Weight: 61 kg (134 lb 7.7  oz) 60.6 kg (133 lb 9.6 oz) 59.603 kg (131 lb 6.4 oz)    Exam:   General:  Awake, in nad  Cardiovascular: regular, s1, s2  Respiratory: normal resp effort, no wheezing  Abdomen: soft,nondistended  Musculoskeletal: perfused, no clubbing   Data Reviewed: Basic Metabolic Panel:  Recent Labs Lab 09/07/14 1548 09/08/14 0515 09/09/14 0329 09/10/14 0500  NA 139 142 143 144  K 2.9* 4.3 4.0 4.0  CL 100 107  113* 110  CO2 22 25 24 22   GLUCOSE 177* 234* 265* 151*  BUN 85* 64* 45* 34*  CREATININE 2.00* 1.66* 1.42* 1.21*  CALCIUM 9.3 8.7 8.2* 8.7  MG  --  2.3  --   --   PHOS  --  3.5  --   --    Liver Function Tests:  Recent Labs Lab 09/07/14 1548 09/08/14 0515 09/09/14 0329 09/10/14 0500  AST 84* 55* 33 30  ALT 71* 58* 43* 44*  ALKPHOS 105 98 74 72  BILITOT 0.7 0.5 0.2* 0.2*  PROT 7.7 7.1 5.7* 5.9*  ALBUMIN 2.8* 2.3* 2.0* 2.1*   No results for input(s): LIPASE, AMYLASE in the last 168 hours. No results for input(s): AMMONIA in the last 168 hours. CBC:  Recent Labs Lab 09/07/14 1548 09/08/14 0515 09/09/14 0329 09/10/14 0500  WBC 14.9* 13.6* 17.5* 17.0*  NEUTROABS 11.6*  --   --   --   HGB 13.0 11.4* 9.7* 10.1*  HCT 37.2 33.3* 29.2* 30.2*  MCV 85.1 84.9 86.1 86.3  PLT 574* 583* 546* 628*   Cardiac Enzymes:  Recent Labs Lab 09/07/14 1548 09/08/14 0515 09/08/14 1518 09/08/14 2130 09/09/14 0329 09/10/14 0500  CKTOTAL 3804* 2293*  --   --  797* 275*  TROPONINI <0.03  --  <0.03  <0.03 0.03  --   --    BNP (last 3 results)  Recent Labs  09/08/14 0515  BNP 64.5    ProBNP (last 3 results) No results for input(s): PROBNP in the last 8760 hours.  CBG:  Recent Labs Lab 09/10/14 0657 09/10/14 1135 09/10/14 1547 09/10/14 2151 09/11/14 0536  GLUCAP 126* 252* 256* 170* 305*    Recent Results (from the past 240 hour(s))  Blood culture (routine x 2)     Status: None (Preliminary result)   Collection Time: 09/07/14  2:20 PM  Result Value Ref Range Status   Specimen Description RIGHT ANTECUBITAL  Final   Special Requests BOTTLES DRAWN AEROBIC AND ANAEROBIC 5CC  Final   Culture   Final           BLOOD CULTURE RECEIVED NO GROWTH TO DATE CULTURE WILL BE HELD FOR 5 DAYS BEFORE ISSUING A FINAL NEGATIVE REPORT Performed at [facility]    Report Status PENDING  Incomplete  Blood culture (routine x 2)     Status: None (Preliminary result)   Collection  Time: 09/07/14  2:25 PM  Result Value Ref Range Status   Specimen Description LEFT ANTECUBITAL  Final   Special Requests BOTTLES DRAWN AEROBIC AND ANAEROBIC 5CC  Final   Culture   Final           BLOOD CULTURE RECEIVED NO GROWTH TO DATE CULTURE WILL BE HELD FOR 5 DAYS BEFORE ISSUING A FINAL NEGATIVE REPORT Performed at [facility]    Report Status PENDING  Incomplete  Clostridium Difficile by PCR     Status: None   Collection Time: 09/07/14  4:00 PM  Result Value Ref Range Status  C difficile by pcr NEGATIVE NEGATIVE Final  Urine culture     Status: None   Collection Time: 09/08/14  1:36 AM  Result Value Ref Range Status   Specimen Description URINE, CLEAN CATCH  Final   Special Requests NONE  Final   Colony Count   Final    60,000 COLONIES/ML Performed at [facility]    Culture   Final    Multiple bacterial morphotypes present, none predominant. Suggest appropriate recollection if clinically indicated. Performed at [facility]    Report Status 09/09/2014 FINAL  Final     Studies: Dg Knee 1-2 Views Left  09/16/2014   CLINICAL DATA:  Left knee pain  EXAM: LEFT KNEE - 1-2 VIEW  COMPARISON:  09/07/2014  FINDINGS: Mild degenerative changes are noted in the medial joint space and patellofemoral space. No joint effusion is seen. No soft tissue abnormality is seen. No fracture is noted.  IMPRESSION: Degenerative change without acute abnormality.   Electronically Signed   By: Inez Catalina M.D.   On: 09/16/14 13:16   Dg Knee 1-2 Views Right  09-16-14   CLINICAL DATA:  Right knee pain, initial encounter  EXAM: RIGHT KNEE - 1-2 VIEW  COMPARISON:  09/07/2014  FINDINGS: No acute fracture or dislocation is noted. Mild degenerative changes are noted in the medial joint space and patellofemoral space. A small joint effusion is seen. No other soft tissue abnormality is noted.  IMPRESSION: Mild degenerative change with small joint effusion.   Electronically Signed    By: Inez Catalina M.D.   On: 2014-09-16 13:13   Nm Pulmonary Perf And Vent  09/09/2014   CLINICAL DATA:  Substernal chest pain. Episodic shortness of breath.  EXAM: NUCLEAR MEDICINE VENTILATION - PERFUSION LUNG SCAN  TECHNIQUE: Ventilation images were obtained in multiple projections using inhaled aerosol technetium 99 M DTPA. Perfusion images were obtained in multiple projections after intravenous injection of Tc-61m MAA.  RADIOPHARMACEUTICALS:  40.0 mCi Tc-62m DTPA aerosol and 6.0 mCi Tc-5m MAA  COMPARISON:  Two-view chest x-ray 09/07/2014.  FINDINGS: Ventilation: There is clumping of the radiopharmaceutical at the hila bilaterally.  Perfusion: No wedge shaped peripheral perfusion defects to suggest acute pulmonary embolism.  IMPRESSION: No evidence for pulmonary embolus.  The perfusion images are normal.  The ventilation images are nondiagnostic as there is clumping of radiopharmaceutical at the hila.   Electronically Signed   By: San Morelle M.D.   On: 09/09/2014 13:28    Scheduled Meds: . allopurinol  100 mg Oral Daily  . amLODipine  10 mg Oral Daily  . aspirin EC  325 mg Oral Daily  . brimonidine  1 drop Both Eyes TID  . bupivacaine  20 mL Infiltration Once  . carvedilol  6.25 mg Oral BID WC  . ciprofloxacin  250 mg Oral BID  . colchicine  0.6 mg Oral Daily  . dorzolamide-timolol  1 drop Both Eyes BID  . feeding supplement (GLUCERNA SHAKE)  237 mL Oral TID BM  . gabapentin  600 mg Oral QHS  . heparin  5,000 Units Subcutaneous 3 times per day  . insulin aspart  0-15 Units Subcutaneous TID WC  . insulin aspart  0-5 Units Subcutaneous QHS  . insulin detemir  10 Units Subcutaneous Daily  . latanoprost  1 drop Both Eyes QHS  . predniSONE  10 mg Oral Q breakfast  . sodium chloride  3 mL Intravenous Q12H   Continuous Infusions: . sodium chloride 50 mL/hr at 09/16/2014  1839    Principal Problem:   Non-traumatic rhabdomyolysis Active Problems:   Type 2 diabetes mellitus with  diabetic neuropathic arthropathy   Knee pain   Chronic diastolic heart failure   Leukocytosis   Hypertension   Physical deconditioning   Hypokalemia   Diarrhea   UTI (urinary tract infection)   Sinus tachycardia   Sarah Colon, [facility] Pager [phone]. If 7PM-7AM, please contact night-coverage at [email or web address], password [facility] 09/11/2014, 10:32 AM  LOS: 4 days

## 2014-09-11 NOTE — Progress Notes (Signed)
Physical Therapy Treatment Patient Details Name: Sarah Colon MRN: [id number] DOB: Oct 03, 1945 Today's Date: 09/11/2014    History of Present Illness 69 y.o. female with past medical history of hypertension, gout, diabetes mellitus, congestive heart failure (EF of 60-65% with grade 1 diastolic dysfunction), chronic kidney disease-stage III, history of septic arthritis, chronic leukocytosis, who presents with diarrhea, dysuria, bilateral knee pain (possible gout flare up), found to have mild rhabdomyolysis and AKI with UTI.    PT Comments    Pt admitted with above diagnosis. Pt currently with functional limitations due to balance and endurance deficits.  Pt to go to NH today and continue therapy.  Sitting balance better today.    Pt will benefit from skilled PT to increase their independence and safety with mobility to allow discharge to the venue listed below.    Follow Up Recommendations  SNF;Supervision/Assistance - 24 hour     Equipment Recommendations  None recommended by PT    Recommendations for Other Services       Precautions / Restrictions Precautions Precautions: Fall Restrictions Weight Bearing Restrictions: No    Mobility  Bed Mobility Overal bed mobility: Needs Assistance Bed Mobility: Supine to Sit     Supine to sit: Min assist     General bed mobility comments: Pt able to get to EOB on her own with rail and incr time.  Once on EOB performed some exercises.  Pt declined getittng OOB to chair.  Pt tearfull about going to NH.  Listened to pt vent while pt exercising.    Transfers                    Ambulation/Gait                 Stairs            Wheelchair Mobility    Modified Rankin (Stroke Patients Only)       Balance Overall balance assessment: Needs assistance Sitting-balance support: No upper extremity supported;Feet supported Sitting balance-Leahy Scale: Fair Sitting balance - Comments: Sat EOB 20 minutes without UE  support.  Performed LE exercises.                             Cognition Arousal/Alertness: Awake/alert Behavior During Therapy: WFL for tasks assessed/performed Overall Cognitive Status: Within Functional Limits for tasks assessed                      Exercises General Exercises - Lower Extremity Ankle Circles/Pumps: AROM;Both;10 reps;Supine Long Arc Quad: AROM;Both;10 reps;Seated Hip Flexion/Marching: AROM;Both;5 reps;Seated    General Comments        Pertinent Vitals/Pain Pain Assessment: 0-10 Pain Score: 6  Pain Location: bil LEs Pain Descriptors / Indicators: Aching;Burning Pain Intervention(s): Limited activity within patient's tolerance;Monitored during session;Repositioned  VSS    Home Living                      Prior Function            PT Goals (current goals can now be found in the care plan section) Progress towards PT goals: Progressing toward goals    Frequency  Min 3X/week    PT Plan Current plan remains appropriate    Co-evaluation             End of Session Equipment Utilized During Treatment: Gait belt Activity Tolerance: Patient limited by fatigue Patient  left: in bed;with call bell/phone within reach (sitting on EOB. )     [id number] PT Time Calculation (min) (ACUTE ONLY): 23 min  Charges:  $Therapeutic Exercise: 8-22 mins $Therapeutic Activity: 8-22 mins                    G CodesIrwin Brakeman F September 19, 2014, 2:34 PM [place],PT Acute Rehabilitation [phone] (pager)

## 2014-09-11 NOTE — Progress Notes (Signed)
   09/11/14 1000  Clinical Encounter Type  Visited With Patient  Visit Type Initial;Spiritual support;Social support  Referral From Nurse  Spiritual Encounters  Spiritual Needs Emotional  Stress Factors  Patient Stress Factors Exhausted;Family relationships;Major life changes;Other (Comment) (chronic pain)   Chaplain was referred to patient via spiritual care consult. Chaplain visited with patient for roughly 15 minutes this morning. Patient was not in good spirits this morning and recounted having pain in her knees for a long time. Patient described herself as a [place] who was saved but also said she was struggling with her faith at this time. Patient said she does not understand why God can put so much suffering on people like her but then other people go through life relatively free of suffering. Chaplain affirmed the patient's faith and her feelings. Patient also expressed concerns over losing her independence due to the chronic pain she has in her knees. Patient described recently having to give up her apartment because it became too painful to make it up and down the stairs. Patient said she has several siblings in different states that have invited her to live with them but the patient just wants to continue to be independent and on her own. Patient has a lot of fear and anxiety over what is next in her life journey. Chaplain let patient know that if she wished to talk further to have a nurse contact him. Chaplain will continue to provide emotional and spiritual care for patient as needed.  Gar Ponto, Chaplain  10:49 AM

## 2014-09-11 NOTE — Progress Notes (Signed)
Pt discharged to [facility], AAox3 IV and tele removed. Questions answered taken via EMS.

## 2014-09-12 ENCOUNTER — Telehealth: Payer: Self-pay | Admitting: Internal Medicine

## 2014-09-12 NOTE — Telephone Encounter (Signed)
returned vicky from [facility] call....sched pt for appt..ok adn aware

## 2014-09-14 LAB — CULTURE, BLOOD (ROUTINE X 2)
CULTURE: NO GROWTH
CULTURE: NO GROWTH

## 2014-09-17 ENCOUNTER — Non-Acute Institutional Stay (SKILLED_NURSING_FACILITY): Payer: Medicare Other | Admitting: Internal Medicine

## 2014-09-17 ENCOUNTER — Encounter: Payer: Self-pay | Admitting: Internal Medicine

## 2014-09-17 DIAGNOSIS — I5032 Chronic diastolic (congestive) heart failure: Secondary | ICD-10-CM

## 2014-09-17 DIAGNOSIS — M1000 Idiopathic gout, unspecified site: Secondary | ICD-10-CM | POA: Diagnosis not present

## 2014-09-17 DIAGNOSIS — I10 Essential (primary) hypertension: Secondary | ICD-10-CM | POA: Diagnosis not present

## 2014-09-17 DIAGNOSIS — R5381 Other malaise: Secondary | ICD-10-CM

## 2014-09-17 DIAGNOSIS — D72829 Elevated white blood cell count, unspecified: Secondary | ICD-10-CM

## 2014-09-17 DIAGNOSIS — M25569 Pain in unspecified knee: Secondary | ICD-10-CM | POA: Diagnosis not present

## 2014-09-17 DIAGNOSIS — E876 Hypokalemia: Secondary | ICD-10-CM

## 2014-09-17 DIAGNOSIS — M6282 Rhabdomyolysis: Secondary | ICD-10-CM | POA: Diagnosis not present

## 2014-09-17 DIAGNOSIS — E11610 Type 2 diabetes mellitus with diabetic neuropathic arthropathy: Secondary | ICD-10-CM | POA: Diagnosis not present
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
## 2014-09-25 DIAGNOSIS — M25561 Pain in right knee: Secondary | ICD-10-CM | POA: Diagnosis not present

## 2014-09-25 DIAGNOSIS — M25562 Pain in left knee: Secondary | ICD-10-CM | POA: Diagnosis not present

## 2014-09-27 ENCOUNTER — Non-Acute Institutional Stay (SKILLED_NURSING_FACILITY): Payer: Medicare Other | Admitting: Adult Health

## 2014-09-27 DIAGNOSIS — E11610 Type 2 diabetes mellitus with diabetic neuropathic arthropathy: Secondary | ICD-10-CM

## 2014-09-29 DIAGNOSIS — D72829 Elevated white blood cell count, unspecified: Secondary | ICD-10-CM | POA: Diagnosis not present

## 2014-09-29 DIAGNOSIS — R269 Unspecified abnormalities of gait and mobility: Secondary | ICD-10-CM | POA: Diagnosis not present

## 2014-09-29 DIAGNOSIS — D649 Anemia, unspecified: Secondary | ICD-10-CM | POA: Diagnosis not present

## 2014-09-29 DIAGNOSIS — R29898 Other symptoms and signs involving the musculoskeletal system: Secondary | ICD-10-CM | POA: Diagnosis not present

## 2014-09-29 DIAGNOSIS — M6282 Rhabdomyolysis: Secondary | ICD-10-CM | POA: Diagnosis not present

## 2014-09-29 DIAGNOSIS — D72828 Other elevated white blood cell count: Secondary | ICD-10-CM | POA: Diagnosis not present

## 2014-09-29 DIAGNOSIS — E11610 Type 2 diabetes mellitus with diabetic neuropathic arthropathy: Secondary | ICD-10-CM | POA: Diagnosis not present

## 2014-09-30 ENCOUNTER — Encounter: Payer: Self-pay | Admitting: Adult Health

## 2014-10-04 ENCOUNTER — Non-Acute Institutional Stay (SKILLED_NURSING_FACILITY): Payer: Medicare Other | Admitting: Adult Health

## 2014-10-04 DIAGNOSIS — E11610 Type 2 diabetes mellitus with diabetic neuropathic arthropathy: Secondary | ICD-10-CM | POA: Diagnosis not present

## 2014-10-10 ENCOUNTER — Telehealth: Payer: Self-pay | Admitting: Internal Medicine

## 2014-10-10 ENCOUNTER — Encounter: Payer: Self-pay | Admitting: Internal Medicine

## 2014-10-10 ENCOUNTER — Encounter: Payer: Self-pay | Admitting: *Deleted

## 2014-10-10 ENCOUNTER — Ambulatory Visit (HOSPITAL_BASED_OUTPATIENT_CLINIC_OR_DEPARTMENT_OTHER): Payer: Medicare Other

## 2014-10-10 ENCOUNTER — Ambulatory Visit (HOSPITAL_BASED_OUTPATIENT_CLINIC_OR_DEPARTMENT_OTHER): Payer: Medicare Other | Admitting: Internal Medicine

## 2014-10-10 VITALS — BP 164/87 | HR 88 | Temp 98.6°F | Resp 18 | Ht 60.0 in | Wt 134.8 lb

## 2014-10-10 DIAGNOSIS — D75839 Thrombocytosis, unspecified: Secondary | ICD-10-CM | POA: Insufficient documentation

## 2014-10-10 DIAGNOSIS — D473 Essential (hemorrhagic) thrombocythemia: Secondary | ICD-10-CM

## 2014-10-10 DIAGNOSIS — D72829 Elevated white blood cell count, unspecified: Secondary | ICD-10-CM

## 2014-10-10 LAB — CBC WITH DIFFERENTIAL/PLATELET
BASO%: 0.6 % (ref 0.0–2.0)
Basophils Absolute: 0.1 10*3/uL (ref 0.0–0.1)
EOS%: 1.5 % (ref 0.0–7.0)
Eosinophils Absolute: 0.2 10*3/uL (ref 0.0–0.5)
HCT: 37.3 % (ref 34.8–46.6)
HEMOGLOBIN: 12.6 g/dL (ref 11.6–15.9)
LYMPH#: 2 10*3/uL (ref 0.9–3.3)
LYMPH%: 17.3 % (ref 14.0–49.7)
MCH: 29 pg (ref 25.1–34.0)
MCHC: 33.7 g/dL (ref 31.5–36.0)
MCV: 86 fL (ref 79.5–101.0)
MONO#: 0.7 10*3/uL (ref 0.1–0.9)
MONO%: 5.7 % (ref 0.0–14.0)
NEUT#: 8.8 10*3/uL — ABNORMAL HIGH (ref 1.5–6.5)
NEUT%: 74.9 % (ref 38.4–76.8)
Platelets: 420 10*3/uL — ABNORMAL HIGH (ref 145–400)
RBC: 4.34 10*6/uL (ref 3.70–5.45)
RDW: 15.2 % — ABNORMAL HIGH (ref 11.2–14.5)
WBC: 11.7 10*3/uL — ABNORMAL HIGH (ref 3.9–10.3)

## 2014-10-10 LAB — LACTATE DEHYDROGENASE (CC13): LDH: 192 U/L (ref 125–245)
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
## 2014-10-10 NOTE — Telephone Encounter (Signed)
Gave and printed appt sched and avs for pt for June °

## 2014-10-10 NOTE — Telephone Encounter (Signed)
Gave and printed appt sched and avs for pt for JUNE °

## 2014-10-11 ENCOUNTER — Ambulatory Visit (INDEPENDENT_AMBULATORY_CARE_PROVIDER_SITE_OTHER): Payer: Medicare Other | Admitting: Neurology

## 2014-10-11 ENCOUNTER — Encounter: Payer: Self-pay | Admitting: Neurology

## 2014-10-11 VITALS — BP 158/84 | HR 82 | Ht 60.0 in

## 2014-10-11 DIAGNOSIS — R29898 Other symptoms and signs involving the musculoskeletal system: Secondary | ICD-10-CM

## 2014-10-11 DIAGNOSIS — R269 Unspecified abnormalities of gait and mobility: Secondary | ICD-10-CM | POA: Diagnosis not present

## 2014-10-11 HISTORY — DX: Unspecified abnormalities of gait and mobility: R26.9

## 2014-10-11 HISTORY — DX: Other symptoms and signs involving the musculoskeletal system: R29.898
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
## 2014-10-11 NOTE — Patient Instructions (Signed)

## 2014-10-13 LAB — RHEUMATOID FACTOR: Rhuematoid fact SerPl-aCnc: 7.7 IU/mL (ref 0.0–13.9)

## 2014-10-13 LAB — SEDIMENTATION RATE: SED RATE: 11 mm/h (ref 0–40)

## 2014-10-13 LAB — ANGIOTENSIN CONVERTING ENZYME: Angio Convert Enzyme: 59 U/L (ref 14–82)

## 2014-10-13 LAB — ANA W/REFLEX: Anti Nuclear Antibody(ANA): NEGATIVE

## 2014-10-13 LAB — CK: Total CK: 72 U/L (ref 24–173)

## 2014-10-15 ENCOUNTER — Telehealth: Payer: Self-pay

## 2014-10-15 NOTE — Telephone Encounter (Signed)
Error

## 2014-10-15 NOTE — Telephone Encounter (Signed)
Spoke with patients brother Silvio Pate, and informed him of pt normal lab results

## 2014-10-15 NOTE — Telephone Encounter (Signed)
-----   Message from Kathrynn Ducking, MD sent at 10/14/2014  6:27 PM EDT -----  The blood work results are unremarkable. Please call the patient.  ----- Message -----    From: [facility] Lab Results In Interface    Sent: 10/12/2014   7:44 AM      To: Kathrynn Ducking, MD

## 2014-10-15 NOTE — Telephone Encounter (Signed)
Patient's provided number is out of order, attempted to call lab results

## 2014-10-17 DIAGNOSIS — D649 Anemia, unspecified: Secondary | ICD-10-CM | POA: Diagnosis not present

## 2014-10-17 DIAGNOSIS — E11610 Type 2 diabetes mellitus with diabetic neuropathic arthropathy: Secondary | ICD-10-CM | POA: Diagnosis not present

## 2014-10-17 DIAGNOSIS — D72828 Other elevated white blood cell count: Secondary | ICD-10-CM | POA: Diagnosis not present

## 2014-10-17 DIAGNOSIS — M6282 Rhabdomyolysis: Secondary | ICD-10-CM | POA: Diagnosis not present

## 2014-10-18 DIAGNOSIS — E11610 Type 2 diabetes mellitus with diabetic neuropathic arthropathy: Secondary | ICD-10-CM | POA: Diagnosis not present

## 2014-10-18 DIAGNOSIS — D72828 Other elevated white blood cell count: Secondary | ICD-10-CM | POA: Diagnosis not present

## 2014-10-18 DIAGNOSIS — D649 Anemia, unspecified: Secondary | ICD-10-CM | POA: Diagnosis not present

## 2014-10-18 DIAGNOSIS — M6282 Rhabdomyolysis: Secondary | ICD-10-CM | POA: Diagnosis not present

## 2014-10-21 DIAGNOSIS — D72828 Other elevated white blood cell count: Secondary | ICD-10-CM | POA: Diagnosis not present

## 2014-10-21 DIAGNOSIS — M6282 Rhabdomyolysis: Secondary | ICD-10-CM | POA: Diagnosis not present

## 2014-10-21 DIAGNOSIS — E11610 Type 2 diabetes mellitus with diabetic neuropathic arthropathy: Secondary | ICD-10-CM | POA: Diagnosis not present

## 2014-10-21 DIAGNOSIS — D649 Anemia, unspecified: Secondary | ICD-10-CM | POA: Diagnosis not present

## 2014-10-22 DIAGNOSIS — D649 Anemia, unspecified: Secondary | ICD-10-CM | POA: Diagnosis not present

## 2014-10-22 DIAGNOSIS — E11610 Type 2 diabetes mellitus with diabetic neuropathic arthropathy: Secondary | ICD-10-CM | POA: Diagnosis not present

## 2014-10-22 DIAGNOSIS — M6282 Rhabdomyolysis: Secondary | ICD-10-CM | POA: Diagnosis not present

## 2014-10-22 DIAGNOSIS — D72828 Other elevated white blood cell count: Secondary | ICD-10-CM | POA: Diagnosis not present

## 2014-10-23 DIAGNOSIS — M25561 Pain in right knee: Secondary | ICD-10-CM | POA: Diagnosis not present

## 2014-10-23 DIAGNOSIS — M25562 Pain in left knee: Secondary | ICD-10-CM | POA: Diagnosis not present

## 2014-10-23 DIAGNOSIS — D649 Anemia, unspecified: Secondary | ICD-10-CM | POA: Diagnosis not present

## 2014-10-23 DIAGNOSIS — D72828 Other elevated white blood cell count: Secondary | ICD-10-CM | POA: Diagnosis not present

## 2014-10-23 DIAGNOSIS — E11610 Type 2 diabetes mellitus with diabetic neuropathic arthropathy: Secondary | ICD-10-CM | POA: Diagnosis not present

## 2014-10-23 DIAGNOSIS — M6282 Rhabdomyolysis: Secondary | ICD-10-CM | POA: Diagnosis not present

## 2014-10-24 ENCOUNTER — Encounter: Payer: Self-pay | Admitting: Neurology

## 2014-10-24 ENCOUNTER — Ambulatory Visit (INDEPENDENT_AMBULATORY_CARE_PROVIDER_SITE_OTHER): Payer: Self-pay | Admitting: Neurology

## 2014-10-24 ENCOUNTER — Ambulatory Visit (INDEPENDENT_AMBULATORY_CARE_PROVIDER_SITE_OTHER): Payer: Medicare Other | Admitting: Neurology

## 2014-10-24 DIAGNOSIS — R269 Unspecified abnormalities of gait and mobility: Secondary | ICD-10-CM

## 2014-10-24 DIAGNOSIS — R2689 Other abnormalities of gait and mobility: Secondary | ICD-10-CM

## 2014-10-24 DIAGNOSIS — M5417 Radiculopathy, lumbosacral region: Secondary | ICD-10-CM | POA: Diagnosis not present

## 2014-10-24 DIAGNOSIS — D72828 Other elevated white blood cell count: Secondary | ICD-10-CM | POA: Diagnosis not present

## 2014-10-24 DIAGNOSIS — R29898 Other symptoms and signs involving the musculoskeletal system: Secondary | ICD-10-CM | POA: Diagnosis not present

## 2014-10-24 DIAGNOSIS — M6282 Rhabdomyolysis: Secondary | ICD-10-CM | POA: Diagnosis not present

## 2014-10-24 DIAGNOSIS — E1142 Type 2 diabetes mellitus with diabetic polyneuropathy: Secondary | ICD-10-CM

## 2014-10-24 DIAGNOSIS — E11610 Type 2 diabetes mellitus with diabetic neuropathic arthropathy: Secondary | ICD-10-CM | POA: Diagnosis not present

## 2014-10-24 DIAGNOSIS — D649 Anemia, unspecified: Secondary | ICD-10-CM | POA: Diagnosis not present

## 2014-10-24 HISTORY — DX: Type 2 diabetes mellitus with diabetic polyneuropathy: E11.42

## 2014-10-24 NOTE — Progress Notes (Signed)
Please refer to EMG and nerve conduction study procedure note. 

## 2014-10-24 NOTE — Progress Notes (Signed)
The patient comes to the office today for EMG and nerve conduction study evaluation. 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 EMG evaluation of both legs showed evidence of acute S1 radiculopathies.  The patient will be sent for MRI of the lumbar spine. She will follow-up in 3-4 months.

## 2014-10-24 NOTE — Procedures (Signed)
     HISTORY:  Sarah Colon is a 69 year old patient with a history of difficulty with ambulation, and weakness in the legs. The patient has a history of diabetes, some numbness in the feet. The patient being evaluated for possible neuropathy or a radiculopathy.  NERVE CONDUCTION STUDIES:  Nerve conduction studies were performed on the right upper extremity. The distal motor latencies and motor amplitudes for the median and ulnar nerves were within normal limits. The F wave latencies and nerve conduction velocities for these nerves were also normal. The sensory latencies for the median and ulnar nerves were normal.  Nerve conduction studies were performed on both lower extremities. The distal motor latencies for the peroneal and posterior tibial nerves were normal bilaterally, with low motor amplitudes seen for these nerves bilaterally as well. The nerve conduction velocities for the peroneal and posterior tibial nerves were normal bilaterally, with absent peroneal sensory latencies bilaterally, and absent H reflex latencies bilaterally.  EMG STUDIES:  EMG study was performed on the left lower extremity:  The tibialis anterior muscle reveals 2 to 3K motor units with full recruitment. No fibrillations or positive waves were seen. The peroneus tertius muscle reveals 2 to 3K motor units with full recruitment. No fibrillations or positive waves were seen. The medial gastrocnemius muscle reveals 1 to 3K motor units with slightly decreased recruitment. 2+ fibrillations and positive waves were seen. The vastus lateralis muscle reveals 2 to 4K motor units with full recruitment. No fibrillations or positive waves were seen. The iliopsoas muscle reveals 2 to 4K motor units with full recruitment. No fibrillations or positive waves were seen. The lumbosacral paraspinal muscles were tested at 3 levels, and revealed no abnormalities of insertional activity at the upper level tested. 2+ positive waves were  seen at the middle and lower levels. There was good relaxation.  EMG study was performed on the right lower extremity:  The tibialis anterior muscle reveals 2 to 3K motor units with full recruitment. No fibrillations or positive waves were seen. The peroneus tertius muscle reveals 2 to 3K motor units with full recruitment. No fibrillations or positive waves were seen. The medial gastrocnemius muscle reveals 1 to 3K motor units with slightly decreased recruitment. 2+ fibrillations and positive waves were seen. The vastus lateralis muscle reveals 2 to 3K motor units with decreased recruitment. No fibrillations or positive waves were seen. The iliopsoas muscle reveals 2 to 5K motor units with full recruitment. No fibrillations or positive waves were seen. The lumbosacral paraspinal muscles were tested at 3 levels, and revealed 2+ positive waves at all 3 levels tested. There was good relaxation.   IMPRESSION:  Nerve conduction studies done on the right upper extremity and both lower extremities reveal evidence of a primarily axonal peripheral neuropathy of moderate severity. EMG evaluations on both lower extremities shows evidence of acute denervation primarily in the S1 nerve root distribution consistent with an acute bilateral S1 radiculopathy.  Jill Alexanders MD 10/24/2014 4:58 PM  [facility] [address] [place], [place] [address]  Phone [phone] Fax [phone]

## 2014-10-25 DIAGNOSIS — E11610 Type 2 diabetes mellitus with diabetic neuropathic arthropathy: Secondary | ICD-10-CM | POA: Diagnosis not present

## 2014-10-25 DIAGNOSIS — M6282 Rhabdomyolysis: Secondary | ICD-10-CM | POA: Diagnosis not present

## 2014-10-25 DIAGNOSIS — D649 Anemia, unspecified: Secondary | ICD-10-CM | POA: Diagnosis not present

## 2014-10-25 DIAGNOSIS — D72828 Other elevated white blood cell count: Secondary | ICD-10-CM | POA: Diagnosis not present

## 2014-10-28 DIAGNOSIS — M6282 Rhabdomyolysis: Secondary | ICD-10-CM | POA: Diagnosis not present

## 2014-10-28 DIAGNOSIS — D72828 Other elevated white blood cell count: Secondary | ICD-10-CM | POA: Diagnosis not present

## 2014-10-28 DIAGNOSIS — E11610 Type 2 diabetes mellitus with diabetic neuropathic arthropathy: Secondary | ICD-10-CM | POA: Diagnosis not present

## 2014-10-28 DIAGNOSIS — D649 Anemia, unspecified: Secondary | ICD-10-CM | POA: Diagnosis not present

## 2014-10-29 DIAGNOSIS — E11610 Type 2 diabetes mellitus with diabetic neuropathic arthropathy: Secondary | ICD-10-CM | POA: Diagnosis not present

## 2014-10-29 DIAGNOSIS — D649 Anemia, unspecified: Secondary | ICD-10-CM | POA: Diagnosis not present

## 2014-10-29 DIAGNOSIS — M6282 Rhabdomyolysis: Secondary | ICD-10-CM | POA: Diagnosis not present

## 2014-10-29 DIAGNOSIS — D72828 Other elevated white blood cell count: Secondary | ICD-10-CM | POA: Diagnosis not present

## 2014-10-30 DIAGNOSIS — M6282 Rhabdomyolysis: Secondary | ICD-10-CM | POA: Diagnosis not present

## 2014-10-30 DIAGNOSIS — E11610 Type 2 diabetes mellitus with diabetic neuropathic arthropathy: Secondary | ICD-10-CM | POA: Diagnosis not present

## 2014-10-30 DIAGNOSIS — D72828 Other elevated white blood cell count: Secondary | ICD-10-CM | POA: Diagnosis not present

## 2014-10-30 DIAGNOSIS — D649 Anemia, unspecified: Secondary | ICD-10-CM | POA: Diagnosis not present

## 2014-10-31 DIAGNOSIS — D72828 Other elevated white blood cell count: Secondary | ICD-10-CM | POA: Diagnosis not present

## 2014-10-31 DIAGNOSIS — D649 Anemia, unspecified: Secondary | ICD-10-CM | POA: Diagnosis not present

## 2014-10-31 DIAGNOSIS — M6282 Rhabdomyolysis: Secondary | ICD-10-CM | POA: Diagnosis not present

## 2014-10-31 DIAGNOSIS — E11610 Type 2 diabetes mellitus with diabetic neuropathic arthropathy: Secondary | ICD-10-CM | POA: Diagnosis not present

## 2014-11-01 ENCOUNTER — Non-Acute Institutional Stay (SKILLED_NURSING_FACILITY): Payer: Medicare Other | Admitting: Adult Health

## 2014-11-01 DIAGNOSIS — G629 Polyneuropathy, unspecified: Secondary | ICD-10-CM | POA: Diagnosis not present

## 2014-11-01 DIAGNOSIS — I10 Essential (primary) hypertension: Secondary | ICD-10-CM

## 2014-11-01 DIAGNOSIS — E1142 Type 2 diabetes mellitus with diabetic polyneuropathy: Secondary | ICD-10-CM

## 2014-11-01 DIAGNOSIS — M1A09X0 Idiopathic chronic gout, multiple sites, without tophus (tophi): Secondary | ICD-10-CM

## 2014-11-01 DIAGNOSIS — D72828 Other elevated white blood cell count: Secondary | ICD-10-CM | POA: Diagnosis not present

## 2014-11-01 DIAGNOSIS — I5032 Chronic diastolic (congestive) heart failure: Secondary | ICD-10-CM | POA: Diagnosis not present

## 2014-11-01 DIAGNOSIS — E11610 Type 2 diabetes mellitus with diabetic neuropathic arthropathy: Secondary | ICD-10-CM | POA: Diagnosis not present

## 2014-11-01 DIAGNOSIS — D649 Anemia, unspecified: Secondary | ICD-10-CM | POA: Diagnosis not present

## 2014-11-01 DIAGNOSIS — M6282 Rhabdomyolysis: Secondary | ICD-10-CM | POA: Diagnosis not present

## 2014-11-04 DIAGNOSIS — D649 Anemia, unspecified: Secondary | ICD-10-CM | POA: Diagnosis not present

## 2014-11-04 DIAGNOSIS — E11610 Type 2 diabetes mellitus with diabetic neuropathic arthropathy: Secondary | ICD-10-CM | POA: Diagnosis not present

## 2014-11-04 DIAGNOSIS — D72828 Other elevated white blood cell count: Secondary | ICD-10-CM | POA: Diagnosis not present

## 2014-11-04 DIAGNOSIS — M6282 Rhabdomyolysis: Secondary | ICD-10-CM | POA: Diagnosis not present

## 2014-11-04 DIAGNOSIS — H2513 Age-related nuclear cataract, bilateral: Secondary | ICD-10-CM | POA: Diagnosis not present

## 2014-11-04 DIAGNOSIS — H4011X4 Primary open-angle glaucoma, indeterminate stage: Secondary | ICD-10-CM | POA: Diagnosis not present

## 2014-11-05 DIAGNOSIS — D72828 Other elevated white blood cell count: Secondary | ICD-10-CM | POA: Diagnosis not present

## 2014-11-05 DIAGNOSIS — D649 Anemia, unspecified: Secondary | ICD-10-CM | POA: Diagnosis not present

## 2014-11-05 DIAGNOSIS — M6282 Rhabdomyolysis: Secondary | ICD-10-CM | POA: Diagnosis not present

## 2014-11-05 DIAGNOSIS — E11610 Type 2 diabetes mellitus with diabetic neuropathic arthropathy: Secondary | ICD-10-CM | POA: Diagnosis not present

## 2014-11-06 DIAGNOSIS — M6282 Rhabdomyolysis: Secondary | ICD-10-CM | POA: Diagnosis not present

## 2014-11-06 DIAGNOSIS — D649 Anemia, unspecified: Secondary | ICD-10-CM | POA: Diagnosis not present

## 2014-11-06 DIAGNOSIS — E11610 Type 2 diabetes mellitus with diabetic neuropathic arthropathy: Secondary | ICD-10-CM | POA: Diagnosis not present

## 2014-11-06 DIAGNOSIS — D72828 Other elevated white blood cell count: Secondary | ICD-10-CM | POA: Diagnosis not present

## 2014-11-07 DIAGNOSIS — D649 Anemia, unspecified: Secondary | ICD-10-CM | POA: Diagnosis not present

## 2014-11-07 DIAGNOSIS — M6282 Rhabdomyolysis: Secondary | ICD-10-CM | POA: Diagnosis not present

## 2014-11-07 DIAGNOSIS — E11610 Type 2 diabetes mellitus with diabetic neuropathic arthropathy: Secondary | ICD-10-CM | POA: Diagnosis not present

## 2014-11-07 DIAGNOSIS — D72828 Other elevated white blood cell count: Secondary | ICD-10-CM | POA: Diagnosis not present

## 2014-11-08 DIAGNOSIS — D649 Anemia, unspecified: Secondary | ICD-10-CM | POA: Diagnosis not present

## 2014-11-08 DIAGNOSIS — E11610 Type 2 diabetes mellitus with diabetic neuropathic arthropathy: Secondary | ICD-10-CM | POA: Diagnosis not present

## 2014-11-08 DIAGNOSIS — D72828 Other elevated white blood cell count: Secondary | ICD-10-CM | POA: Diagnosis not present

## 2014-11-08 DIAGNOSIS — M6282 Rhabdomyolysis: Secondary | ICD-10-CM | POA: Diagnosis not present

## 2014-11-11 ENCOUNTER — Encounter: Payer: Self-pay | Admitting: Internal Medicine

## 2014-11-11 ENCOUNTER — Ambulatory Visit (HOSPITAL_BASED_OUTPATIENT_CLINIC_OR_DEPARTMENT_OTHER): Payer: Medicare Other | Admitting: Internal Medicine

## 2014-11-11 ENCOUNTER — Telehealth (HOSPITAL_COMMUNITY): Payer: Self-pay

## 2014-11-11 ENCOUNTER — Other Ambulatory Visit (HOSPITAL_BASED_OUTPATIENT_CLINIC_OR_DEPARTMENT_OTHER): Payer: Medicare Other

## 2014-11-11 ENCOUNTER — Telehealth: Payer: Self-pay | Admitting: Internal Medicine

## 2014-11-11 VITALS — BP 169/76 | HR 78 | Temp 98.9°F | Resp 19

## 2014-11-11 DIAGNOSIS — D75839 Thrombocytosis, unspecified: Secondary | ICD-10-CM

## 2014-11-11 DIAGNOSIS — D72829 Elevated white blood cell count, unspecified: Secondary | ICD-10-CM | POA: Diagnosis not present

## 2014-11-11 DIAGNOSIS — D473 Essential (hemorrhagic) thrombocythemia: Secondary | ICD-10-CM

## 2014-11-11 LAB — CBC WITH DIFFERENTIAL/PLATELET
BASO%: 0.9 % (ref 0.0–2.0)
Basophils Absolute: 0.1 10*3/uL (ref 0.0–0.1)
EOS ABS: 0.4 10*3/uL (ref 0.0–0.5)
EOS%: 3.8 % (ref 0.0–7.0)
HEMATOCRIT: 38.7 % (ref 34.8–46.6)
HEMOGLOBIN: 12.7 g/dL (ref 11.6–15.9)
LYMPH#: 2.5 10*3/uL (ref 0.9–3.3)
LYMPH%: 22.1 % (ref 14.0–49.7)
MCH: 28.2 pg (ref 25.1–34.0)
MCHC: 32.7 g/dL (ref 31.5–36.0)
MCV: 86.2 fL (ref 79.5–101.0)
MONO#: 0.9 10*3/uL (ref 0.1–0.9)
MONO%: 7.7 % (ref 0.0–14.0)
NEUT%: 65.5 % (ref 38.4–76.8)
NEUTROS ABS: 7.3 10*3/uL — AB (ref 1.5–6.5)
Platelets: 347 10*3/uL (ref 145–400)
RBC: 4.49 10*6/uL (ref 3.70–5.45)
RDW: 15.2 % — ABNORMAL HIGH (ref 11.2–14.5)
WBC: 11.2 10*3/uL — AB (ref 3.9–10.3)

## 2014-11-11 NOTE — Telephone Encounter (Signed)
Gave and printed appt sched adna vs fo rpt for jUNE and July
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
## 2014-11-12 DIAGNOSIS — D72828 Other elevated white blood cell count: Secondary | ICD-10-CM | POA: Diagnosis not present

## 2014-11-12 DIAGNOSIS — E11610 Type 2 diabetes mellitus with diabetic neuropathic arthropathy: Secondary | ICD-10-CM | POA: Diagnosis not present

## 2014-11-12 DIAGNOSIS — D649 Anemia, unspecified: Secondary | ICD-10-CM | POA: Diagnosis not present

## 2014-11-12 DIAGNOSIS — M6282 Rhabdomyolysis: Secondary | ICD-10-CM | POA: Diagnosis not present

## 2014-11-13 DIAGNOSIS — E0821 Diabetes mellitus due to underlying condition with diabetic nephropathy: Secondary | ICD-10-CM | POA: Diagnosis not present

## 2014-11-14 DIAGNOSIS — D649 Anemia, unspecified: Secondary | ICD-10-CM | POA: Diagnosis not present

## 2014-11-14 DIAGNOSIS — M6282 Rhabdomyolysis: Secondary | ICD-10-CM | POA: Diagnosis not present

## 2014-11-14 DIAGNOSIS — E11610 Type 2 diabetes mellitus with diabetic neuropathic arthropathy: Secondary | ICD-10-CM | POA: Diagnosis not present

## 2014-11-14 DIAGNOSIS — D72828 Other elevated white blood cell count: Secondary | ICD-10-CM | POA: Diagnosis not present

## 2014-11-15 ENCOUNTER — Non-Acute Institutional Stay (SKILLED_NURSING_FACILITY): Payer: Medicare Other | Admitting: Adult Health

## 2014-11-15 DIAGNOSIS — M6282 Rhabdomyolysis: Secondary | ICD-10-CM | POA: Diagnosis not present

## 2014-11-15 DIAGNOSIS — D72828 Other elevated white blood cell count: Secondary | ICD-10-CM | POA: Diagnosis not present

## 2014-11-15 DIAGNOSIS — M79606 Pain in leg, unspecified: Secondary | ICD-10-CM | POA: Diagnosis not present

## 2014-11-15 DIAGNOSIS — M7989 Other specified soft tissue disorders: Secondary | ICD-10-CM | POA: Diagnosis not present

## 2014-11-15 DIAGNOSIS — D649 Anemia, unspecified: Secondary | ICD-10-CM | POA: Diagnosis not present

## 2014-11-15 DIAGNOSIS — R609 Edema, unspecified: Secondary | ICD-10-CM | POA: Diagnosis not present

## 2014-11-15 DIAGNOSIS — E11610 Type 2 diabetes mellitus with diabetic neuropathic arthropathy: Secondary | ICD-10-CM

## 2014-11-15 DIAGNOSIS — M542 Cervicalgia: Secondary | ICD-10-CM | POA: Diagnosis not present

## 2014-11-18 ENCOUNTER — Other Ambulatory Visit: Payer: Self-pay | Admitting: *Deleted

## 2014-11-18 MED ORDER — OXYCODONE HCL 5 MG PO TABS: ORAL_TABLET | ORAL | Status: DC

## 2014-11-18 NOTE — Telephone Encounter (Signed)
[facility] 

## 2014-11-24 ENCOUNTER — Other Ambulatory Visit: Payer: Self-pay | Admitting: Radiology

## 2014-11-25 ENCOUNTER — Other Ambulatory Visit: Payer: Self-pay | Admitting: Radiology

## 2014-11-26 ENCOUNTER — Ambulatory Visit (HOSPITAL_COMMUNITY): Admission: RE | Admit: 2014-11-26 | Payer: Medicare Other | Source: Ambulatory Visit
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
## 2014-11-27 NOTE — Progress Notes (Signed)
This encounter was created in error - please disregard.

## 2014-11-29 ENCOUNTER — Other Ambulatory Visit: Payer: Self-pay | Admitting: Radiology

## 2014-11-29 DIAGNOSIS — E119 Type 2 diabetes mellitus without complications: Secondary | ICD-10-CM | POA: Diagnosis not present

## 2014-12-03 ENCOUNTER — Other Ambulatory Visit: Payer: Self-pay | Admitting: Radiology

## 2014-12-04 ENCOUNTER — Ambulatory Visit (HOSPITAL_COMMUNITY): Admission: RE | Admit: 2014-12-04 | Payer: Medicare Other | Source: Ambulatory Visit

## 2014-12-04 ENCOUNTER — Other Ambulatory Visit: Payer: Self-pay | Admitting: *Deleted

## 2014-12-05 DIAGNOSIS — I10 Essential (primary) hypertension: Secondary | ICD-10-CM

## 2014-12-05 DIAGNOSIS — I152 Hypertension secondary to endocrine disorders: Secondary | ICD-10-CM | POA: Insufficient documentation

## 2014-12-05 DIAGNOSIS — E1159 Type 2 diabetes mellitus with other circulatory complications: Secondary | ICD-10-CM | POA: Insufficient documentation
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
## 2014-12-06 ENCOUNTER — Non-Acute Institutional Stay (SKILLED_NURSING_FACILITY): Payer: Medicare Other | Admitting: Adult Health

## 2014-12-06 DIAGNOSIS — I10 Essential (primary) hypertension: Secondary | ICD-10-CM

## 2014-12-06 DIAGNOSIS — I5032 Chronic diastolic (congestive) heart failure: Secondary | ICD-10-CM

## 2014-12-06 DIAGNOSIS — M1A09X0 Idiopathic chronic gout, multiple sites, without tophus (tophi): Secondary | ICD-10-CM | POA: Diagnosis not present

## 2014-12-06 DIAGNOSIS — E11610 Type 2 diabetes mellitus with diabetic neuropathic arthropathy: Secondary | ICD-10-CM

## 2014-12-09 ENCOUNTER — Other Ambulatory Visit: Payer: Self-pay | Admitting: Radiology

## 2014-12-11 ENCOUNTER — Ambulatory Visit (HOSPITAL_COMMUNITY)
Admission: RE | Admit: 2014-12-11 | Discharge: 2014-12-11 | Disposition: A | Discharge status: Home / Self Care | Payer: Medicare Other | Source: Ambulatory Visit | Attending: Internal Medicine | Admitting: Internal Medicine

## 2014-12-11 ENCOUNTER — Encounter (HOSPITAL_COMMUNITY): Payer: Self-pay

## 2014-12-11 ENCOUNTER — Ambulatory Visit: Payer: Medicare Other | Admitting: Internal Medicine

## 2014-12-11 ENCOUNTER — Other Ambulatory Visit: Payer: Medicare Other

## 2014-12-11 DIAGNOSIS — D473 Essential (hemorrhagic) thrombocythemia: Secondary | ICD-10-CM

## 2014-12-11 DIAGNOSIS — D72829 Elevated white blood cell count, unspecified: Secondary | ICD-10-CM | POA: Diagnosis not present

## 2014-12-11 DIAGNOSIS — D759 Disease of blood and blood-forming organs, unspecified: Secondary | ICD-10-CM | POA: Diagnosis not present

## 2014-12-11 DIAGNOSIS — D75839 Thrombocytosis, unspecified: Secondary | ICD-10-CM

## 2014-12-11 LAB — CBC
HCT: 38.5 % (ref 36.0–46.0)
Hemoglobin: 13.2 g/dL (ref 12.0–15.0)
MCH: 28.8 pg (ref 26.0–34.0)
MCHC: 34.3 g/dL (ref 30.0–36.0)
MCV: 83.9 fL (ref 78.0–100.0)
PLATELETS: 321 10*3/uL (ref 150–400)
RBC: 4.59 MIL/uL (ref 3.87–5.11)
RDW: 14.4 % (ref 11.5–15.5)
WBC: 10.2 10*3/uL (ref 4.0–10.5)

## 2014-12-11 LAB — GLUCOSE, CAPILLARY: Glucose-Capillary: 164 mg/dL — ABNORMAL HIGH (ref 65–99)

## 2014-12-11 LAB — BONE MARROW EXAM

## 2014-12-11 LAB — PROTIME-INR
INR: 1 (ref 0.00–1.49)
PROTHROMBIN TIME: 13.4 s (ref 11.6–15.2)

## 2014-12-11 LAB — APTT: aPTT: 26 seconds (ref 24–37)

## 2014-12-11 MED ORDER — FENTANYL CITRATE (PF) 100 MCG/2ML IJ SOLN
INTRAMUSCULAR | Status: AC
  Filled 2014-12-11: qty 2

## 2014-12-11 MED ORDER — FENTANYL CITRATE (PF) 100 MCG/2ML IJ SOLN
INTRAMUSCULAR | Status: AC | PRN
  Administered 2014-12-11 (×3): 25 ug via INTRAVENOUS

## 2014-12-11 MED ORDER — MIDAZOLAM HCL 2 MG/2ML IJ SOLN
INTRAMUSCULAR | Status: AC | PRN
  Administered 2014-12-11: 0.5 mg via INTRAVENOUS
  Administered 2014-12-11: 1 mg via INTRAVENOUS
  Administered 2014-12-11: 0.5 mg via INTRAVENOUS

## 2014-12-11 MED ORDER — SODIUM CHLORIDE 0.9 % IV SOLN
Freq: Once | INTRAVENOUS | Status: AC
  Administered 2014-12-11: 09:00:00 via INTRAVENOUS

## 2014-12-11 MED ORDER — MIDAZOLAM HCL 2 MG/2ML IJ SOLN
INTRAMUSCULAR | Status: AC
  Filled 2014-12-11: qty 4
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
## 2014-12-11 NOTE — Discharge Instructions (Signed)
Conscious Sedation Sedation is the use of medicines to promote relaxation and relieve discomfort and anxiety. Conscious sedation is a type of sedation. Under conscious sedation you are less alert than normal but are still able to respond to instructions or stimulation. Conscious sedation is used during short medical and dental procedures. It is milder than deep sedation or general anesthesia and allows you to return to your regular activities sooner.  LET [facility] CARE PROVIDER KNOW ABOUT:   Any allergies you have.  All medicines you are taking, including vitamins, herbs, eye drops, creams, and over-the-counter medicines.  Use of steroids (by mouth or creams).  Previous problems you or members of your family have had with the use of anesthetics.  Any blood disorders you have.  Previous surgeries you have had.  Medical conditions you have.  Possibility of pregnancy, if this applies.  Use of cigarettes, alcohol, or illegal drugs. RISKS AND COMPLICATIONS Generally, this is a safe procedure. However, as with any procedure, problems can occur. Possible problems include:  Oversedation.  Trouble breathing on your own. You may need to have a breathing tube until you are awake and breathing on your own.  Allergic reaction to any of the medicines used for the procedure. BEFORE THE PROCEDURE  You may have blood tests done. These tests can help show how well your kidneys and liver are working. They can also show how well your blood clots.  A physical exam will be done.  Only take medicines as directed by your health care provider. You may need to stop taking medicines (such as blood thinners, aspirin, or nonsteroidal anti-inflammatory drugs) before the procedure.   Do not eat or drink at least 6 hours before the procedure or as directed by your health care provider.  Arrange for a responsible adult, family member, or friend to take you home after the procedure. He or she should stay  with you for at least 24 hours after the procedure, until the medicine has worn off. PROCEDURE   An intravenous (IV) catheter will be inserted into one of your veins. Medicine will be able to flow directly into your body through this catheter. You may be given medicine through this tube to help prevent pain and help you relax.  The medical or dental procedure will be done. AFTER THE PROCEDURE  You will stay in a recovery area until the medicine has worn off. Your blood pressure and pulse will be checked.   Depending on the procedure you had, you may be allowed to go home when you can tolerate liquids and your pain is under control. Document Released: 02/09/2001 Document Revised: 05/22/2013 Document Reviewed: 01/22/2013 [facility] Patient Information 2015 [place], [place]. This information is not intended to replace advice given to you by your health care provider. Make sure you discuss any questions you have with your health care provider. Bone Marrow Aspiration, Bone Marrow Biopsy Care After Read the instructions outlined below and refer to this sheet in the next few weeks. These discharge instructions provide you with general information on caring for yourself after you leave the hospital. Your caregiver may also give you specific instructions. While your treatment has been planned according to the most current medical practices available, unavoidable complications occasionally occur. If you have any problems or questions after discharge, call your caregiver. FINDING OUT THE RESULTS OF YOUR TEST Not all test results are available during your visit. If your test results are not back during the visit, make an appointment with your  your caregiver to find out the results. Do not assume everything is normal if you have not heard from your caregiver or the medical facility. It is important for you to follow up on all of your test results.  °HOME CARE INSTRUCTIONS  °You have had sedation and may be sleepy or  dizzy. Your thinking may not be as clear as usual. For the next 24 hours: °· Only take over-the-counter or prescription medicines for pain, discomfort, and or fever as directed by your caregiver. °· Do not drink alcohol. °· Do not smoke. °· Do not drive. °· Do not make important legal decisions. °· Do not operate heavy machinery. °· Do not care for small children by yourself. °· Keep your dressing clean and dry. You may replace dressing with a bandage after 24 hours. °· You may take a bath or shower after 24 hours. °· Use an ice pack for 20 minutes every 2 hours while awake for pain as needed. °SEEK MEDICAL CARE IF:  °· There is redness, swelling, or increasing pain at the biopsy site. °· There is pus coming from the biopsy site. °· There is drainage from a biopsy site lasting longer than one day. °· An unexplained oral temperature above 102° F (38.9° C) develops. °SEEK IMMEDIATE MEDICAL CARE IF:  °· You develop a rash. °· You have difficulty breathing. °· You develop any reaction or side effects to medications given. °Document Released: 12/04/2004 Document Revised: 08/09/2011 Document Reviewed: 05/14/2008 °[employer]® Patient Information ©2015 [employer]. This information is not intended to replace advice given to you by your health care provider. Make sure you discuss any questions you have with your health care provider. ° °

## 2014-12-11 NOTE — Procedures (Signed)
Technically successful CT guided bone marrow aspiration and biopsy of left iliac crest. No immediate complications.   

## 2014-12-18 ENCOUNTER — Other Ambulatory Visit: Payer: Medicare Other

## 2014-12-18 ENCOUNTER — Telehealth: Payer: Self-pay | Admitting: Internal Medicine

## 2014-12-18 ENCOUNTER — Ambulatory Visit: Payer: Medicare Other | Admitting: Physician Assistant

## 2014-12-18 LAB — CHROMOSOME ANALYSIS, BONE MARROW

## 2014-12-18 NOTE — Telephone Encounter (Signed)
s.w. pt living facility and r/s pt appt due to AJ out of the office....ok and aware

## 2014-12-20 DIAGNOSIS — H2512 Age-related nuclear cataract, left eye: Secondary | ICD-10-CM | POA: Diagnosis not present

## 2014-12-20 DIAGNOSIS — H25041 Posterior subcapsular polar age-related cataract, right eye: Secondary | ICD-10-CM | POA: Diagnosis not present

## 2014-12-20 DIAGNOSIS — H2511 Age-related nuclear cataract, right eye: Secondary | ICD-10-CM | POA: Diagnosis not present

## 2014-12-20 DIAGNOSIS — H4011X0 Primary open-angle glaucoma, stage unspecified: Secondary | ICD-10-CM | POA: Diagnosis not present

## 2014-12-20 DIAGNOSIS — H25011 Cortical age-related cataract, right eye: Secondary | ICD-10-CM | POA: Diagnosis not present

## 2014-12-23 ENCOUNTER — Other Ambulatory Visit (HOSPITAL_BASED_OUTPATIENT_CLINIC_OR_DEPARTMENT_OTHER): Payer: Medicare Other

## 2014-12-23 ENCOUNTER — Encounter: Payer: Self-pay | Admitting: Physician Assistant

## 2014-12-23 ENCOUNTER — Ambulatory Visit (HOSPITAL_BASED_OUTPATIENT_CLINIC_OR_DEPARTMENT_OTHER): Payer: Medicare Other | Admitting: Physician Assistant

## 2014-12-23 VITALS — BP 157/71 | HR 75 | Temp 98.5°F | Resp 18 | Ht 60.0 in | Wt 143.5 lb

## 2014-12-23 DIAGNOSIS — D72829 Elevated white blood cell count, unspecified: Secondary | ICD-10-CM | POA: Diagnosis not present

## 2014-12-23 LAB — CBC WITH DIFFERENTIAL/PLATELET
BASO%: 0.6 % (ref 0.0–2.0)
Basophils Absolute: 0.1 10*3/uL (ref 0.0–0.1)
EOS ABS: 0.4 10*3/uL (ref 0.0–0.5)
EOS%: 3.5 % (ref 0.0–7.0)
HCT: 39.8 % (ref 34.8–46.6)
HGB: 13.4 g/dL (ref 11.6–15.9)
LYMPH%: 13.5 % — AB (ref 14.0–49.7)
MCH: 29 pg (ref 25.1–34.0)
MCHC: 33.7 g/dL (ref 31.5–36.0)
MCV: 85.9 fL (ref 79.5–101.0)
MONO#: 0.6 10*3/uL (ref 0.1–0.9)
MONO%: 5.8 % (ref 0.0–14.0)
NEUT#: 8.1 10*3/uL — ABNORMAL HIGH (ref 1.5–6.5)
NEUT%: 76.6 % (ref 38.4–76.8)
PLATELETS: 333 10*3/uL (ref 145–400)
RBC: 4.63 10*6/uL (ref 3.70–5.45)
RDW: 14.6 % — ABNORMAL HIGH (ref 11.2–14.5)
WBC: 10.6 10*3/uL — AB (ref 3.9–10.3)
lymph#: 1.4 10*3/uL (ref 0.9–3.3)

## 2014-12-23 LAB — LACTATE DEHYDROGENASE (CC13): LDH: 191 U/L (ref 125–245)

## 2014-12-23 NOTE — Patient Instructions (Signed)
Your bone marrow biopsy was negative. Your elevated white count is likely reactive in nature, likely secondary to inflammation Dear being released from the cancer clinic to be followed by your primary care provider
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
## 2014-12-27 ENCOUNTER — Encounter (HOSPITAL_COMMUNITY): Payer: Self-pay

## 2015-01-13 ENCOUNTER — Other Ambulatory Visit: Payer: Self-pay | Admitting: *Deleted

## 2015-01-13 ENCOUNTER — Encounter: Payer: Self-pay | Admitting: Adult Health

## 2015-01-13 ENCOUNTER — Non-Acute Institutional Stay (SKILLED_NURSING_FACILITY): Payer: Medicare Other | Admitting: Adult Health

## 2015-01-13 DIAGNOSIS — M1A09X0 Idiopathic chronic gout, multiple sites, without tophus (tophi): Secondary | ICD-10-CM | POA: Diagnosis not present

## 2015-01-13 DIAGNOSIS — R609 Edema, unspecified: Secondary | ICD-10-CM | POA: Insufficient documentation

## 2015-01-13 DIAGNOSIS — M6282 Rhabdomyolysis: Secondary | ICD-10-CM

## 2015-01-13 DIAGNOSIS — E11610 Type 2 diabetes mellitus with diabetic neuropathic arthropathy: Secondary | ICD-10-CM

## 2015-01-13 DIAGNOSIS — I5032 Chronic diastolic (congestive) heart failure: Secondary | ICD-10-CM

## 2015-01-13 MED ORDER — INSULIN GLARGINE 100 UNIT/ML ~~LOC~~ SOLN: 20.0000 [IU] | Freq: Every day | SUBCUTANEOUS | Status: DC

## 2015-01-13 MED ORDER — INSULIN ASPART 100 UNIT/ML ~~LOC~~ SOLN: 5.0000 [IU] | Freq: Three times a day (TID) | SUBCUTANEOUS | Status: DC

## 2015-01-13 MED ORDER — OXYCODONE HCL 5 MG PO TABS: ORAL_TABLET | ORAL | Status: DC
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
## 2015-01-13 NOTE — Telephone Encounter (Signed)
[facility] 
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
## 2015-01-14 DIAGNOSIS — M79675 Pain in left toe(s): Secondary | ICD-10-CM | POA: Diagnosis not present

## 2015-01-14 DIAGNOSIS — M79674 Pain in right toe(s): Secondary | ICD-10-CM | POA: Diagnosis not present

## 2015-01-14 DIAGNOSIS — B351 Tinea unguium: Secondary | ICD-10-CM | POA: Diagnosis not present

## 2015-01-14 DIAGNOSIS — E1159 Type 2 diabetes mellitus with other circulatory complications: Secondary | ICD-10-CM | POA: Diagnosis not present

## 2015-01-17 DIAGNOSIS — M6282 Rhabdomyolysis: Secondary | ICD-10-CM | POA: Diagnosis not present

## 2015-01-17 DIAGNOSIS — N390 Urinary tract infection, site not specified: Secondary | ICD-10-CM | POA: Diagnosis not present

## 2015-01-17 DIAGNOSIS — E119 Type 2 diabetes mellitus without complications: Secondary | ICD-10-CM | POA: Diagnosis not present

## 2015-01-22 DIAGNOSIS — E119 Type 2 diabetes mellitus without complications: Secondary | ICD-10-CM | POA: Diagnosis not present

## 2015-01-22 DIAGNOSIS — N390 Urinary tract infection, site not specified: Secondary | ICD-10-CM | POA: Diagnosis not present

## 2015-01-22 DIAGNOSIS — M6282 Rhabdomyolysis: Secondary | ICD-10-CM | POA: Diagnosis not present

## 2015-01-24 DIAGNOSIS — M6282 Rhabdomyolysis: Secondary | ICD-10-CM | POA: Diagnosis not present

## 2015-01-24 DIAGNOSIS — E119 Type 2 diabetes mellitus without complications: Secondary | ICD-10-CM | POA: Diagnosis not present

## 2015-01-24 DIAGNOSIS — N390 Urinary tract infection, site not specified: Secondary | ICD-10-CM | POA: Diagnosis not present

## 2015-01-28 DIAGNOSIS — E119 Type 2 diabetes mellitus without complications: Secondary | ICD-10-CM | POA: Diagnosis not present

## 2015-01-28 DIAGNOSIS — M6282 Rhabdomyolysis: Secondary | ICD-10-CM | POA: Diagnosis not present

## 2015-01-28 DIAGNOSIS — N390 Urinary tract infection, site not specified: Secondary | ICD-10-CM | POA: Diagnosis not present

## 2015-01-29 DIAGNOSIS — E119 Type 2 diabetes mellitus without complications: Secondary | ICD-10-CM | POA: Diagnosis not present

## 2015-01-29 DIAGNOSIS — N390 Urinary tract infection, site not specified: Secondary | ICD-10-CM | POA: Diagnosis not present

## 2015-01-29 DIAGNOSIS — M6282 Rhabdomyolysis: Secondary | ICD-10-CM | POA: Diagnosis not present

## 2015-01-31 DIAGNOSIS — E119 Type 2 diabetes mellitus without complications: Secondary | ICD-10-CM | POA: Diagnosis not present

## 2015-01-31 DIAGNOSIS — M6282 Rhabdomyolysis: Secondary | ICD-10-CM | POA: Diagnosis not present

## 2015-01-31 DIAGNOSIS — N390 Urinary tract infection, site not specified: Secondary | ICD-10-CM | POA: Diagnosis not present

## 2015-02-03 DIAGNOSIS — E119 Type 2 diabetes mellitus without complications: Secondary | ICD-10-CM | POA: Diagnosis not present

## 2015-02-03 DIAGNOSIS — M6282 Rhabdomyolysis: Secondary | ICD-10-CM | POA: Diagnosis not present

## 2015-02-03 DIAGNOSIS — N390 Urinary tract infection, site not specified: Secondary | ICD-10-CM | POA: Diagnosis not present

## 2015-02-04 DIAGNOSIS — M6282 Rhabdomyolysis: Secondary | ICD-10-CM | POA: Diagnosis not present

## 2015-02-04 DIAGNOSIS — E119 Type 2 diabetes mellitus without complications: Secondary | ICD-10-CM | POA: Diagnosis not present

## 2015-02-04 DIAGNOSIS — N390 Urinary tract infection, site not specified: Secondary | ICD-10-CM | POA: Diagnosis not present

## 2015-02-06 DIAGNOSIS — M6282 Rhabdomyolysis: Secondary | ICD-10-CM | POA: Diagnosis not present

## 2015-02-06 DIAGNOSIS — E119 Type 2 diabetes mellitus without complications: Secondary | ICD-10-CM | POA: Diagnosis not present

## 2015-02-06 DIAGNOSIS — N390 Urinary tract infection, site not specified: Secondary | ICD-10-CM | POA: Diagnosis not present

## 2015-02-07 DIAGNOSIS — M6282 Rhabdomyolysis: Secondary | ICD-10-CM | POA: Diagnosis not present

## 2015-02-07 DIAGNOSIS — E119 Type 2 diabetes mellitus without complications: Secondary | ICD-10-CM | POA: Diagnosis not present

## 2015-02-07 DIAGNOSIS — N390 Urinary tract infection, site not specified: Secondary | ICD-10-CM | POA: Diagnosis not present

## 2015-02-10 DIAGNOSIS — N183 Chronic kidney disease, stage 3 (moderate): Secondary | ICD-10-CM | POA: Diagnosis not present

## 2015-02-10 DIAGNOSIS — R609 Edema, unspecified: Secondary | ICD-10-CM | POA: Diagnosis not present

## 2015-02-10 DIAGNOSIS — I129 Hypertensive chronic kidney disease with stage 1 through stage 4 chronic kidney disease, or unspecified chronic kidney disease: Secondary | ICD-10-CM | POA: Diagnosis not present

## 2015-02-10 DIAGNOSIS — E0822 Diabetes mellitus due to underlying condition with diabetic chronic kidney disease: Secondary | ICD-10-CM | POA: Diagnosis not present

## 2015-02-10 DIAGNOSIS — N08 Glomerular disorders in diseases classified elsewhere: Secondary | ICD-10-CM | POA: Diagnosis not present

## 2015-02-11 ENCOUNTER — Other Ambulatory Visit: Payer: Self-pay | Admitting: Adult Health

## 2015-02-11 DIAGNOSIS — E119 Type 2 diabetes mellitus without complications: Secondary | ICD-10-CM | POA: Diagnosis not present

## 2015-02-11 DIAGNOSIS — M6282 Rhabdomyolysis: Secondary | ICD-10-CM | POA: Diagnosis not present

## 2015-02-11 DIAGNOSIS — N390 Urinary tract infection, site not specified: Secondary | ICD-10-CM | POA: Diagnosis not present

## 2015-02-12 DIAGNOSIS — N390 Urinary tract infection, site not specified: Secondary | ICD-10-CM | POA: Diagnosis not present

## 2015-02-12 DIAGNOSIS — E119 Type 2 diabetes mellitus without complications: Secondary | ICD-10-CM | POA: Diagnosis not present

## 2015-02-12 DIAGNOSIS — M6282 Rhabdomyolysis: Secondary | ICD-10-CM | POA: Diagnosis not present

## 2015-02-13 DIAGNOSIS — E119 Type 2 diabetes mellitus without complications: Secondary | ICD-10-CM | POA: Diagnosis not present

## 2015-02-13 DIAGNOSIS — M6282 Rhabdomyolysis: Secondary | ICD-10-CM | POA: Diagnosis not present

## 2015-02-13 DIAGNOSIS — N390 Urinary tract infection, site not specified: Secondary | ICD-10-CM | POA: Diagnosis not present

## 2015-02-17 DIAGNOSIS — E119 Type 2 diabetes mellitus without complications: Secondary | ICD-10-CM | POA: Diagnosis not present

## 2015-03-16 DIAGNOSIS — E119 Type 2 diabetes mellitus without complications: Secondary | ICD-10-CM | POA: Diagnosis not present

## 2015-03-24 ENCOUNTER — Other Ambulatory Visit: Payer: Self-pay | Admitting: Adult Health

## 2015-05-09 DIAGNOSIS — E119 Type 2 diabetes mellitus without complications: Secondary | ICD-10-CM | POA: Diagnosis not present

## 2015-07-02 ENCOUNTER — Other Ambulatory Visit: Payer: Self-pay | Admitting: Adult Health

## 2015-08-25 ENCOUNTER — Emergency Department (HOSPITAL_COMMUNITY)
Admission: EM | Admit: 2015-08-25 | Discharge: 2015-08-25 | Disposition: A | Discharge status: Other Acute Inpatient Hospital | Payer: Medicare Other | Attending: Emergency Medicine | Admitting: Emergency Medicine

## 2015-08-25 ENCOUNTER — Emergency Department (HOSPITAL_COMMUNITY): Payer: Medicare Other

## 2015-08-25 ENCOUNTER — Encounter (HOSPITAL_COMMUNITY): Payer: Self-pay | Admitting: Emergency Medicine

## 2015-08-25 DIAGNOSIS — IMO0002 Reserved for concepts with insufficient information to code with codable children: Secondary | ICD-10-CM

## 2015-08-25 DIAGNOSIS — W19XXXA Unspecified fall, initial encounter: Secondary | ICD-10-CM | POA: Diagnosis present

## 2015-08-25 DIAGNOSIS — I5032 Chronic diastolic (congestive) heart failure: Secondary | ICD-10-CM | POA: Diagnosis not present

## 2015-08-25 DIAGNOSIS — I129 Hypertensive chronic kidney disease with stage 1 through stage 4 chronic kidney disease, or unspecified chronic kidney disease: Secondary | ICD-10-CM | POA: Diagnosis not present

## 2015-08-25 DIAGNOSIS — M109 Gout, unspecified: Secondary | ICD-10-CM | POA: Insufficient documentation

## 2015-08-25 DIAGNOSIS — F446 Conversion disorder with sensory symptom or deficit: Secondary | ICD-10-CM | POA: Diagnosis not present

## 2015-08-25 DIAGNOSIS — R531 Weakness: Secondary | ICD-10-CM

## 2015-08-25 DIAGNOSIS — Y9289 Other specified places as the place of occurrence of the external cause: Secondary | ICD-10-CM | POA: Insufficient documentation

## 2015-08-25 DIAGNOSIS — R29898 Other symptoms and signs involving the musculoskeletal system: Secondary | ICD-10-CM | POA: Diagnosis present

## 2015-08-25 DIAGNOSIS — Y998 Other external cause status: Secondary | ICD-10-CM | POA: Insufficient documentation

## 2015-08-25 DIAGNOSIS — H409 Unspecified glaucoma: Secondary | ICD-10-CM | POA: Diagnosis not present

## 2015-08-25 DIAGNOSIS — E860 Dehydration: Secondary | ICD-10-CM | POA: Diagnosis not present

## 2015-08-25 DIAGNOSIS — N179 Acute kidney failure, unspecified: Secondary | ICD-10-CM | POA: Diagnosis not present

## 2015-08-25 DIAGNOSIS — T3211 Corrosions involving 10-19% of body surface with 10-19% third degree corrosion: Secondary | ICD-10-CM | POA: Diagnosis not present

## 2015-08-25 DIAGNOSIS — I509 Heart failure, unspecified: Secondary | ICD-10-CM | POA: Insufficient documentation

## 2015-08-25 DIAGNOSIS — Z7952 Long term (current) use of systemic steroids: Secondary | ICD-10-CM | POA: Diagnosis not present

## 2015-08-25 DIAGNOSIS — E1159 Type 2 diabetes mellitus with other circulatory complications: Secondary | ICD-10-CM | POA: Diagnosis present

## 2015-08-25 DIAGNOSIS — Z79899 Other long term (current) drug therapy: Secondary | ICD-10-CM | POA: Insufficient documentation

## 2015-08-25 DIAGNOSIS — E1142 Type 2 diabetes mellitus with diabetic polyneuropathy: Secondary | ICD-10-CM | POA: Diagnosis not present

## 2015-08-25 DIAGNOSIS — Y9389 Activity, other specified: Secondary | ICD-10-CM | POA: Insufficient documentation

## 2015-08-25 DIAGNOSIS — M6282 Rhabdomyolysis: Secondary | ICD-10-CM | POA: Diagnosis present

## 2015-08-25 DIAGNOSIS — R0602 Shortness of breath: Secondary | ICD-10-CM | POA: Diagnosis not present

## 2015-08-25 DIAGNOSIS — Y278XXA Contact with other hot objects, undetermined intent, initial encounter: Secondary | ICD-10-CM | POA: Diagnosis not present

## 2015-08-25 DIAGNOSIS — E872 Acidosis, unspecified: Secondary | ICD-10-CM | POA: Diagnosis present

## 2015-08-25 DIAGNOSIS — Z794 Long term (current) use of insulin: Secondary | ICD-10-CM | POA: Insufficient documentation

## 2015-08-25 DIAGNOSIS — T300 Burn of unspecified body region, unspecified degree: Secondary | ICD-10-CM

## 2015-08-25 DIAGNOSIS — T2132XA Burn of third degree of abdominal wall, initial encounter: Secondary | ICD-10-CM | POA: Diagnosis not present

## 2015-08-25 DIAGNOSIS — M130 Polyarthritis, unspecified: Secondary | ICD-10-CM | POA: Diagnosis not present

## 2015-08-25 DIAGNOSIS — G8929 Other chronic pain: Secondary | ICD-10-CM | POA: Insufficient documentation

## 2015-08-25 DIAGNOSIS — I10 Essential (primary) hypertension: Secondary | ICD-10-CM

## 2015-08-25 DIAGNOSIS — Z87891 Personal history of nicotine dependence: Secondary | ICD-10-CM | POA: Diagnosis not present

## 2015-08-25 DIAGNOSIS — N390 Urinary tract infection, site not specified: Secondary | ICD-10-CM | POA: Diagnosis present

## 2015-08-25 DIAGNOSIS — R5381 Other malaise: Secondary | ICD-10-CM | POA: Diagnosis present

## 2015-08-25 DIAGNOSIS — Z862 Personal history of diseases of the blood and blood-forming organs and certain disorders involving the immune mechanism: Secondary | ICD-10-CM | POA: Insufficient documentation

## 2015-08-25 DIAGNOSIS — N189 Chronic kidney disease, unspecified: Secondary | ICD-10-CM | POA: Diagnosis not present

## 2015-08-25 DIAGNOSIS — I152 Hypertension secondary to endocrine disorders: Secondary | ICD-10-CM | POA: Diagnosis present

## 2015-08-25 LAB — CBC
HCT: 34.9 % — ABNORMAL LOW (ref 36.0–46.0)
HEMOGLOBIN: 12.8 g/dL (ref 12.0–15.0)
MCH: 29.9 pg (ref 26.0–34.0)
MCHC: 36.7 g/dL — ABNORMAL HIGH (ref 30.0–36.0)
MCV: 81.5 fL (ref 78.0–100.0)
PLATELETS: 795 10*3/uL — AB (ref 150–400)
RBC: 4.28 MIL/uL (ref 3.87–5.11)
RDW: 14.3 % (ref 11.5–15.5)
WBC: 26.6 10*3/uL — AB (ref 4.0–10.5)

## 2015-08-25 LAB — URINALYSIS, ROUTINE W REFLEX MICROSCOPIC
BILIRUBIN URINE: NEGATIVE
GLUCOSE, UA: NEGATIVE mg/dL
Ketones, ur: NEGATIVE mg/dL
Nitrite: NEGATIVE
PH: 7 (ref 5.0–8.0)
Protein, ur: NEGATIVE mg/dL
SPECIFIC GRAVITY, URINE: 1.014 (ref 1.005–1.030)

## 2015-08-25 LAB — BASIC METABOLIC PANEL
Anion gap: 15 (ref 5–15)
BUN: 138 mg/dL — ABNORMAL HIGH (ref 6–20)
CALCIUM: 8.3 mg/dL — AB (ref 8.9–10.3)
CO2: 19 mmol/L — AB (ref 22–32)
CREATININE: 3.31 mg/dL — AB (ref 0.44–1.00)
Chloride: 103 mmol/L (ref 101–111)
GFR, EST AFRICAN AMERICAN: 15 mL/min — AB (ref >60)
GFR, EST NON AFRICAN AMERICAN: 13 mL/min — AB (ref >60)
Glucose, Bld: 217 mg/dL — ABNORMAL HIGH (ref 65–99)
Potassium: 4.5 mmol/L (ref 3.5–5.1)
SODIUM: 137 mmol/L (ref 135–145)

## 2015-08-25 LAB — I-STAT CG4 LACTIC ACID, ED
LACTIC ACID, VENOUS: 1.59 mmol/L (ref 0.5–2.0)
Lactic Acid, Venous: 1.69 mmol/L (ref 0.5–2.0)

## 2015-08-25 LAB — TROPONIN I
TROPONIN I: 0.4 ng/mL — AB (ref <0.031)
Troponin I: 0.4 ng/mL — ABNORMAL HIGH (ref <0.031)

## 2015-08-25 LAB — HEPATIC FUNCTION PANEL
ALK PHOS: 166 U/L — AB (ref 38–126)
ALT: 115 U/L — ABNORMAL HIGH (ref 14–54)
AST: 94 U/L — AB (ref 15–41)
Albumin: 2.3 g/dL — ABNORMAL LOW (ref 3.5–5.0)
BILIRUBIN DIRECT: 0.4 mg/dL (ref 0.1–0.5)
BILIRUBIN TOTAL: 1.1 mg/dL (ref 0.3–1.2)
Indirect Bilirubin: 0.7 mg/dL (ref 0.3–0.9)
Total Protein: 6.8 g/dL (ref 6.5–8.1)

## 2015-08-25 LAB — URINE MICROSCOPIC-ADD ON

## 2015-08-25 LAB — CBG MONITORING, ED: GLUCOSE-CAPILLARY: 161 mg/dL — AB (ref 65–99)

## 2015-08-25 LAB — CK: Total CK: 1726 U/L — ABNORMAL HIGH (ref 38–234)

## 2015-08-25 MED ORDER — SODIUM CHLORIDE 0.9 % IV BOLUS (SEPSIS)
2000.0000 mL | Freq: Once | INTRAVENOUS | Status: AC
  Administered 2015-08-25: 2000 mL via INTRAVENOUS

## 2015-08-25 MED ORDER — DEXTROSE 5 % IV SOLN
1.0000 g | INTRAVENOUS | Status: DC
Start: 2015-08-25 — End: 2015-08-26
  Administered 2015-08-25: 1 g via INTRAVENOUS
  Filled 2015-08-25: qty 10

## 2015-08-25 MED ORDER — SODIUM CHLORIDE 0.9 % IV SOLN: INTRAVENOUS | Status: DC

## 2015-08-25 MED ORDER — HYDROMORPHONE HCL 1 MG/ML IJ SOLN
0.5000 mg | Freq: Once | INTRAMUSCULAR | Status: AC
  Administered 2015-08-25: 0.5 mg via INTRAVENOUS
  Filled 2015-08-25: qty 1
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
## 2015-08-25 NOTE — ED Notes (Signed)
Pt not in room, Unable to collect labs

## 2015-08-25 NOTE — ED Notes (Signed)
Pt transported to MRI 

## 2015-08-25 NOTE — Progress Notes (Addendum)
EDCM spoke to patient at bedside.  Patient lives at home alone.  She reports she has a daughter who "runs her own business and can't help me that often."  Patient also rpeorts she has a brother who would like her to move to [place].  She went on to say her brother who lives in [place] is a Pharmacist, community and is on the road often.  She reports if she moved to [facility] she would not live with her brother.  She reports she doesn't like [place].  Patient reports she has had home health in the past but cannot remember the name of the agency.  She reports she has been to [facility] and [facility] in the past.  She does not wear oxygen at home, patient wearing oxygen in the ED.  Patient reports she receives meals on wheels.  Patient does not have a pcp at this time.  Patient reports she has a walker and a cane at home. Patient reports she doesn't think she would like to go back to her apartment.  Patient reports she is usually able to stand up at the sink to get washed, but her legs have been so weak she hasn't been able to do it.  Patient reports she has not had anything to eat or drink in the last three days.  EDCM discussed home health services with patient and private duty nursing services.  Also discussed the possibility of patient requiring short term rehab prior to returning home.  Patient verbalized understanding.  She reports she has spoken with her brother about ALF in the past.  She reports she had Medicaid in the past but , "they cut it off because they said I make too much money."  No further EDCM needs at this time.
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
## 2015-08-25 NOTE — ED Notes (Signed)
Pt back from MRI 

## 2015-08-25 NOTE — Clinical Social Work Note (Signed)
Clinical Social Work Assessment  Patient Details  Name: Sarah Colon MRN: [id number] Date of Birth: 1945-06-11  Date of referral:  08/25/15               Reason for consult:   (twisted ankle)                Permission sought to share information with:   (None.) Permission granted to share information::  No  Name::        Agency::     Relationship::     Contact Information:     Housing/Transportation Living arrangements for the past 2 months:  Single Family Home Source of Information:  Patient Patient Interpreter Needed:  None Criminal Activity/Legal Involvement Pertinent to Current Situation/Hospitalization:  No - Comment as needed Significant Relationships:  Siblings (Brother in [facility] Lives with:  Self Do you feel safe going back to the place where you live?   (Patient states brother wants her to come to [facility].) Need for family participation in patient care:  Yes (Comment) (Patient states her brother wants her to come to [facility].)  Care giving concerns:  Patient states that since she feels she needs assistance with completing ADL's since she twisted her ankle this past Saturday. Patient states that she completed her ADL's independently prior to twisting her ankle.  Social Worker assessment / plan:  CSW met with patient at bedside. Patient was alert and oriented. There was no family present. Patient states she twisted her ankle Saturday and it has started to swell. Patient stated " I didn't know it was swelling until Saturday". Patient stated she twisted her ankle due to trying to pull a rug from under her bed.  Patient states that she has a daughter in [place], but says that they do not get along. Patient states her brother lives in [place] and wants her to come stay in [facility] Patient states she feels as though she has a good support but feels as though it could be better. Patient states that she is not sure how she feels about facility at this time. Patient states she was a resident at [facility] in the past, but had to leave due to insurance. CSW encouraged patient to apply for Medicaid.  Patient states her friend Georga Kaufmann takes her to the stores when needed for groceries.  Employment status:  Retired Forensic scientist:   Production designer, theatre/television/film.) PT Recommendations:  Not assessed at this time Information / Referral to community resources:   (CSW encouraged patient to apply for Medicaid.)  Patient/Family's Response to care:  Patient is appropriate and accepting.  Patient/Family's Understanding of and Emotional Response to Diagnosis, Current Treatment, and Prognosis:  Patient has no questions for CSW.  Emotional Assessment Appearance:  Appears stated age Attitude/Demeanor/Rapport:   (Appropriate.) Affect (typically observed):  Accepting Orientation:  Oriented to Self, Oriented to Place, Oriented to  Time, Oriented to Situation Alcohol / Substance use:  Not Applicable Psych involvement (Current and /or in the community):  No (Comment)  Discharge Needs  Concerns to be addressed:    Readmission within the last 30 days:  No Current discharge risk:  None Barriers to Discharge:  No Barriers Identified   Bernita Buffy, LCSW 08/25/2015, 9:33 PM

## 2015-08-25 NOTE — ED Notes (Signed)
[facility] called for transport; Report given to Charge Nurse at [facility]

## 2015-08-26 ENCOUNTER — Telehealth (HOSPITAL_COMMUNITY): Payer: Self-pay

## 2015-08-26 DIAGNOSIS — T2175XA Corrosion of third degree of buttock, initial encounter: Secondary | ICD-10-CM | POA: Insufficient documentation

## 2015-08-26 NOTE — Telephone Encounter (Signed)
Lab calling with critical lab.  Informed pt transferred to [facility] last night

## 2015-08-28 DIAGNOSIS — S8262XA Displaced fracture of lateral malleolus of left fibula, initial encounter for closed fracture: Secondary | ICD-10-CM | POA: Insufficient documentation

## 2015-08-29 DIAGNOSIS — T310 Burns involving less than 10% of body surface: Secondary | ICD-10-CM | POA: Insufficient documentation

## 2015-08-29 DIAGNOSIS — Z794 Long term (current) use of insulin: Secondary | ICD-10-CM

## 2015-08-29 DIAGNOSIS — IMO0001 Reserved for inherently not codable concepts without codable children: Secondary | ICD-10-CM | POA: Insufficient documentation

## 2015-08-29 DIAGNOSIS — E119 Type 2 diabetes mellitus without complications: Secondary | ICD-10-CM

## 2015-08-29 LAB — CULTURE, BLOOD (ROUTINE X 2)

## 2015-08-30 ENCOUNTER — Telehealth (HOSPITAL_BASED_OUTPATIENT_CLINIC_OR_DEPARTMENT_OTHER): Payer: Self-pay | Admitting: Emergency Medicine

## 2015-08-30 LAB — CULTURE, BLOOD (ROUTINE X 2): CULTURE: NO GROWTH
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
## 2015-09-01 DIAGNOSIS — G9511 Acute infarction of spinal cord (embolic) (nonembolic): Secondary | ICD-10-CM | POA: Insufficient documentation

## 2015-09-16 DIAGNOSIS — D62 Acute posthemorrhagic anemia: Secondary | ICD-10-CM | POA: Insufficient documentation

## 2015-09-16 DIAGNOSIS — R32 Unspecified urinary incontinence: Secondary | ICD-10-CM | POA: Insufficient documentation

## 2015-09-23 DIAGNOSIS — L89892 Pressure ulcer of other site, stage 2: Secondary | ICD-10-CM | POA: Insufficient documentation

## 2015-09-23 DIAGNOSIS — L89152 Pressure ulcer of sacral region, stage 2: Secondary | ICD-10-CM | POA: Insufficient documentation

## 2015-11-26 DIAGNOSIS — M869 Osteomyelitis, unspecified: Secondary | ICD-10-CM | POA: Insufficient documentation

## 2015-11-26 DIAGNOSIS — T148XXA Other injury of unspecified body region, initial encounter: Secondary | ICD-10-CM | POA: Insufficient documentation

## 2016-04-14 IMAGING — CR DG KNEE 1-2V*L*
2 series · 2 of 2 positions shown · non-contrast
Comparison: 09/07/2014

CLINICAL DATA: Left knee pain

EXAM:
LEFT KNEE - 1-2 VIEW

[AP]
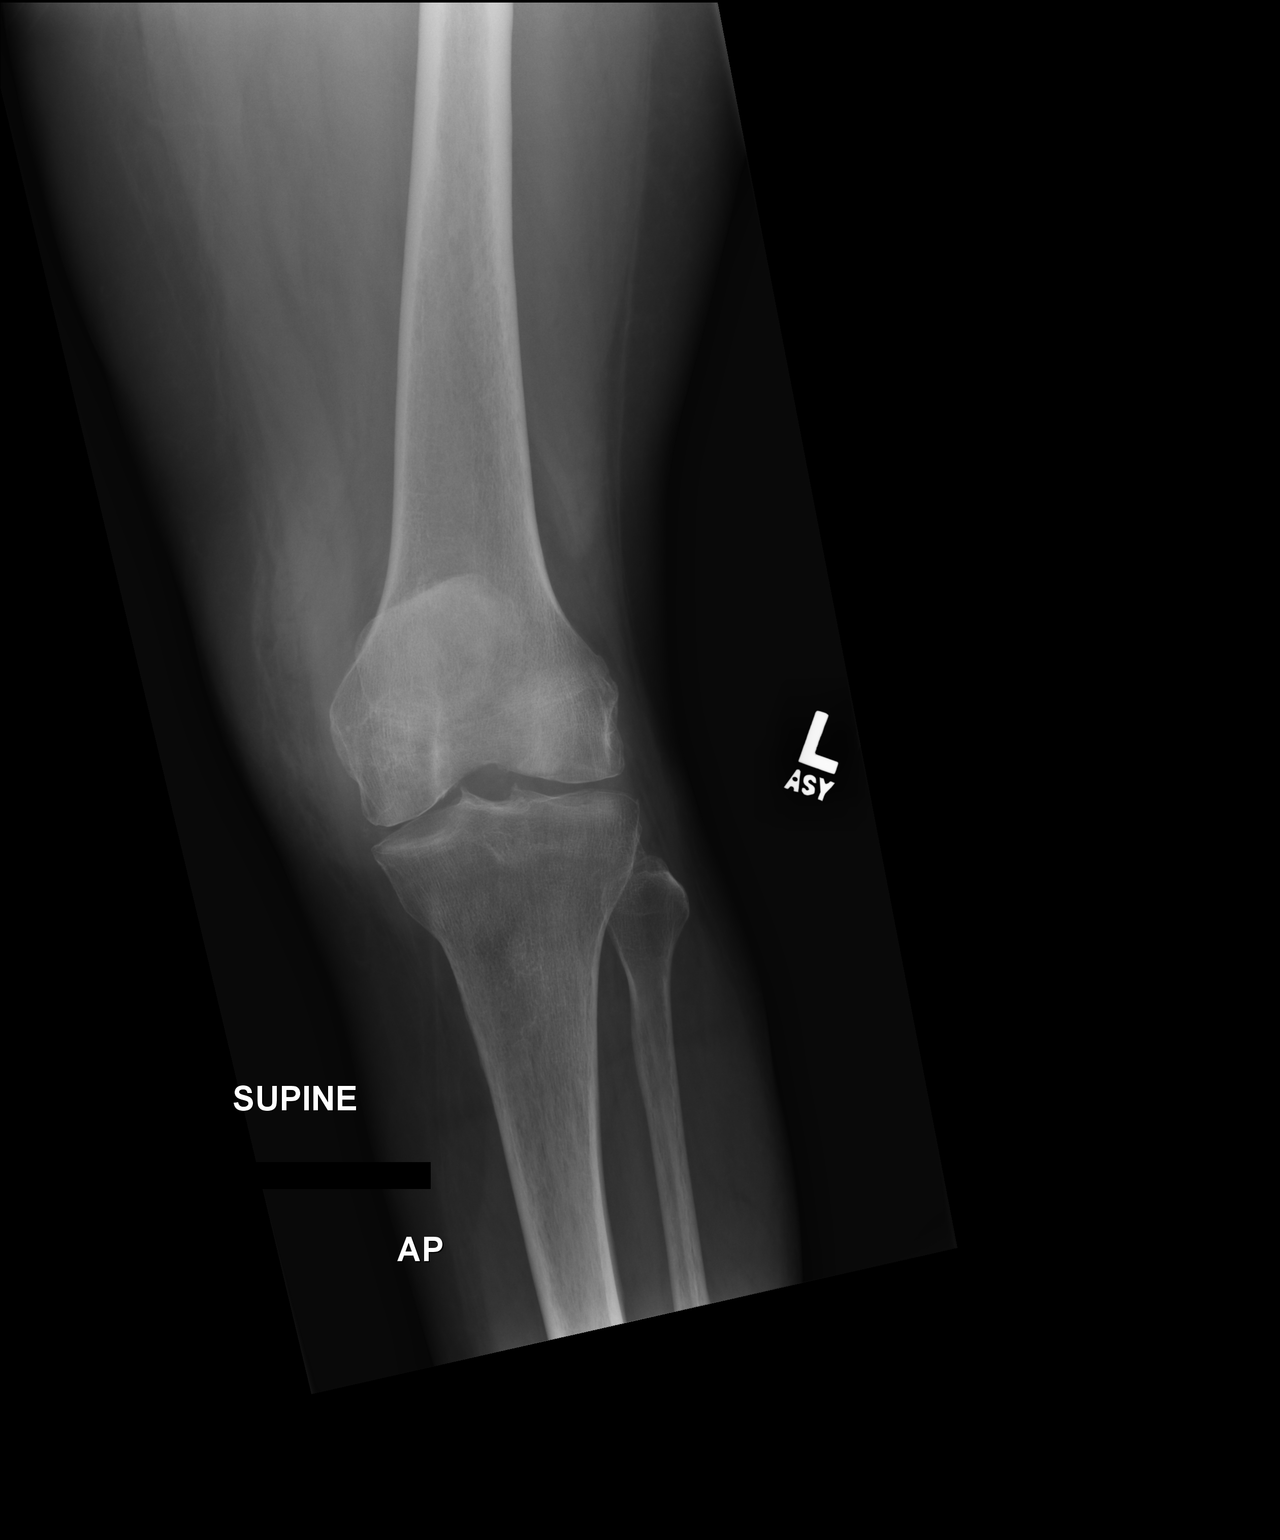

[lateral]
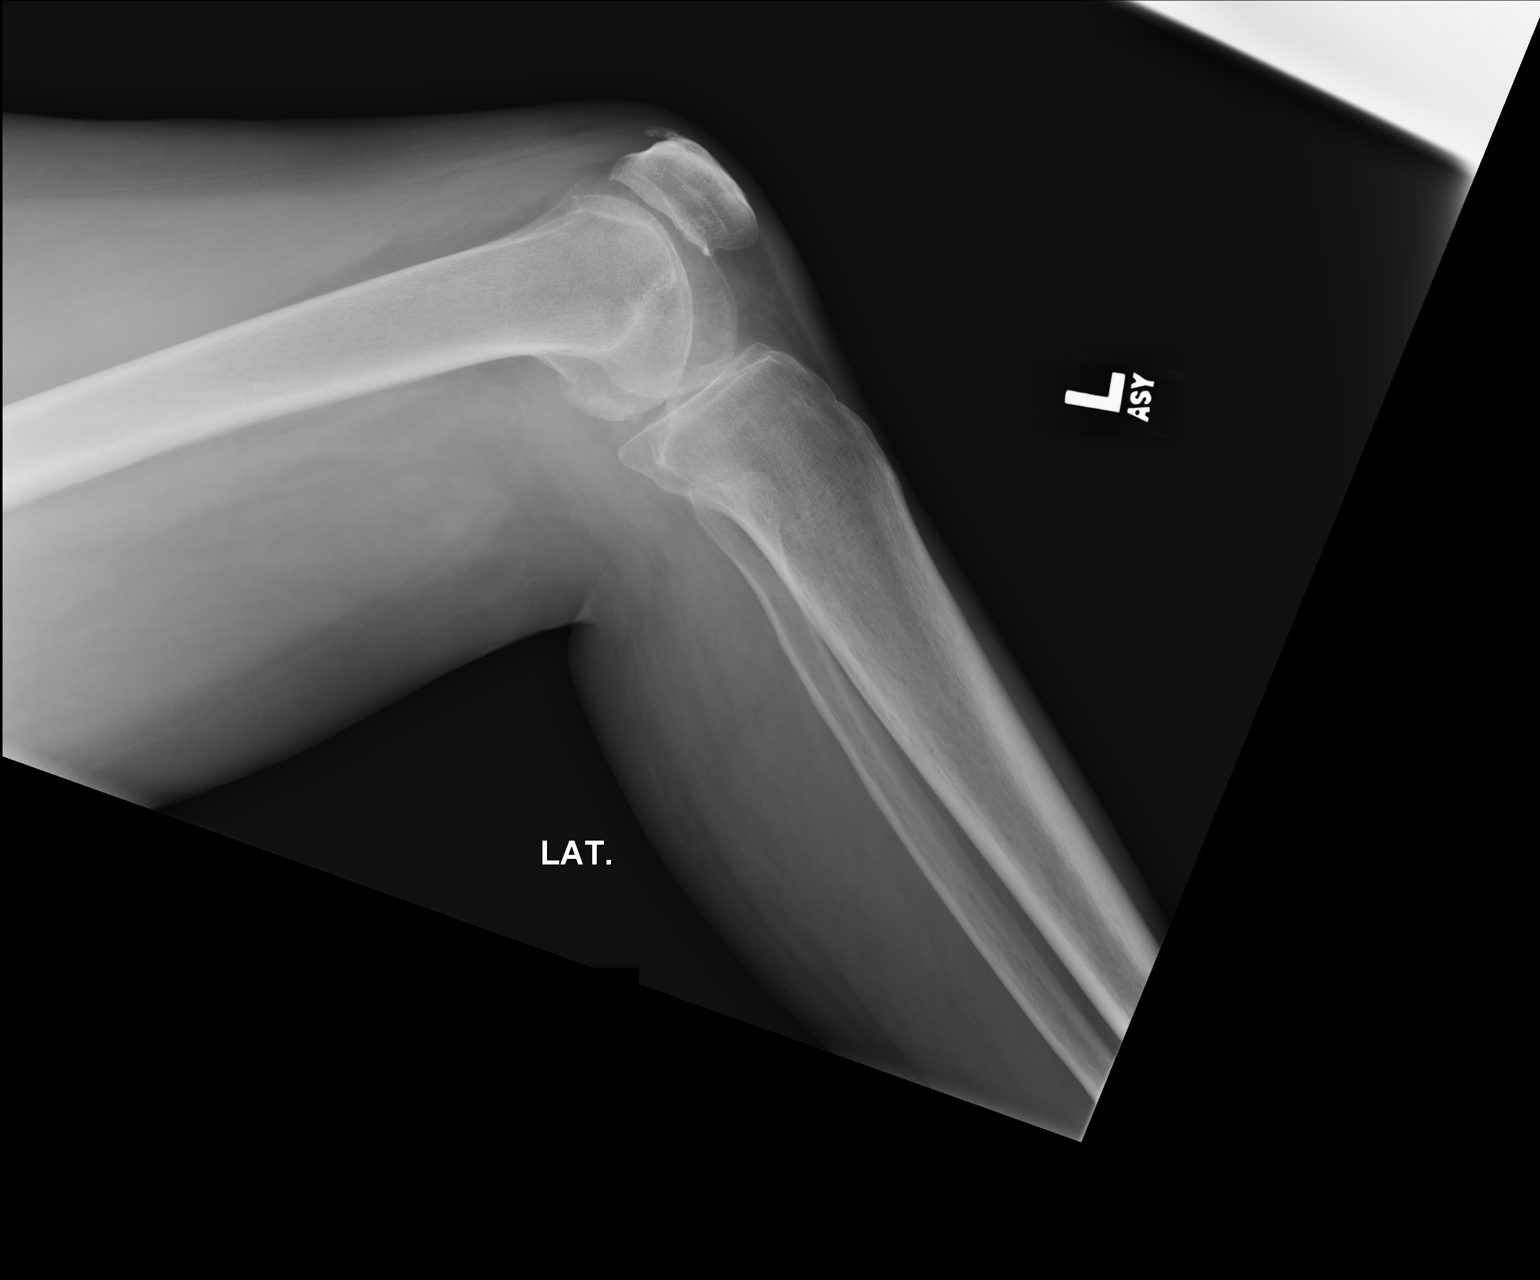

[2 of 2 positions shown; findings below may reference images not displayed]

FINDINGS: Mild degenerative changes are noted in the medial joint space and
patellofemoral space. No joint effusion is seen. No soft tissue
abnormality is seen. No fracture is noted.
IMPRESSION: Degenerative change without acute abnormality.

## 2016-04-14 IMAGING — CR DG KNEE 1-2V*R*
2 series · 2 of 2 positions shown · non-contrast
Comparison: 09/07/2014

CLINICAL DATA: Right knee pain, initial encounter

EXAM:
RIGHT KNEE - 1-2 VIEW

[AP]
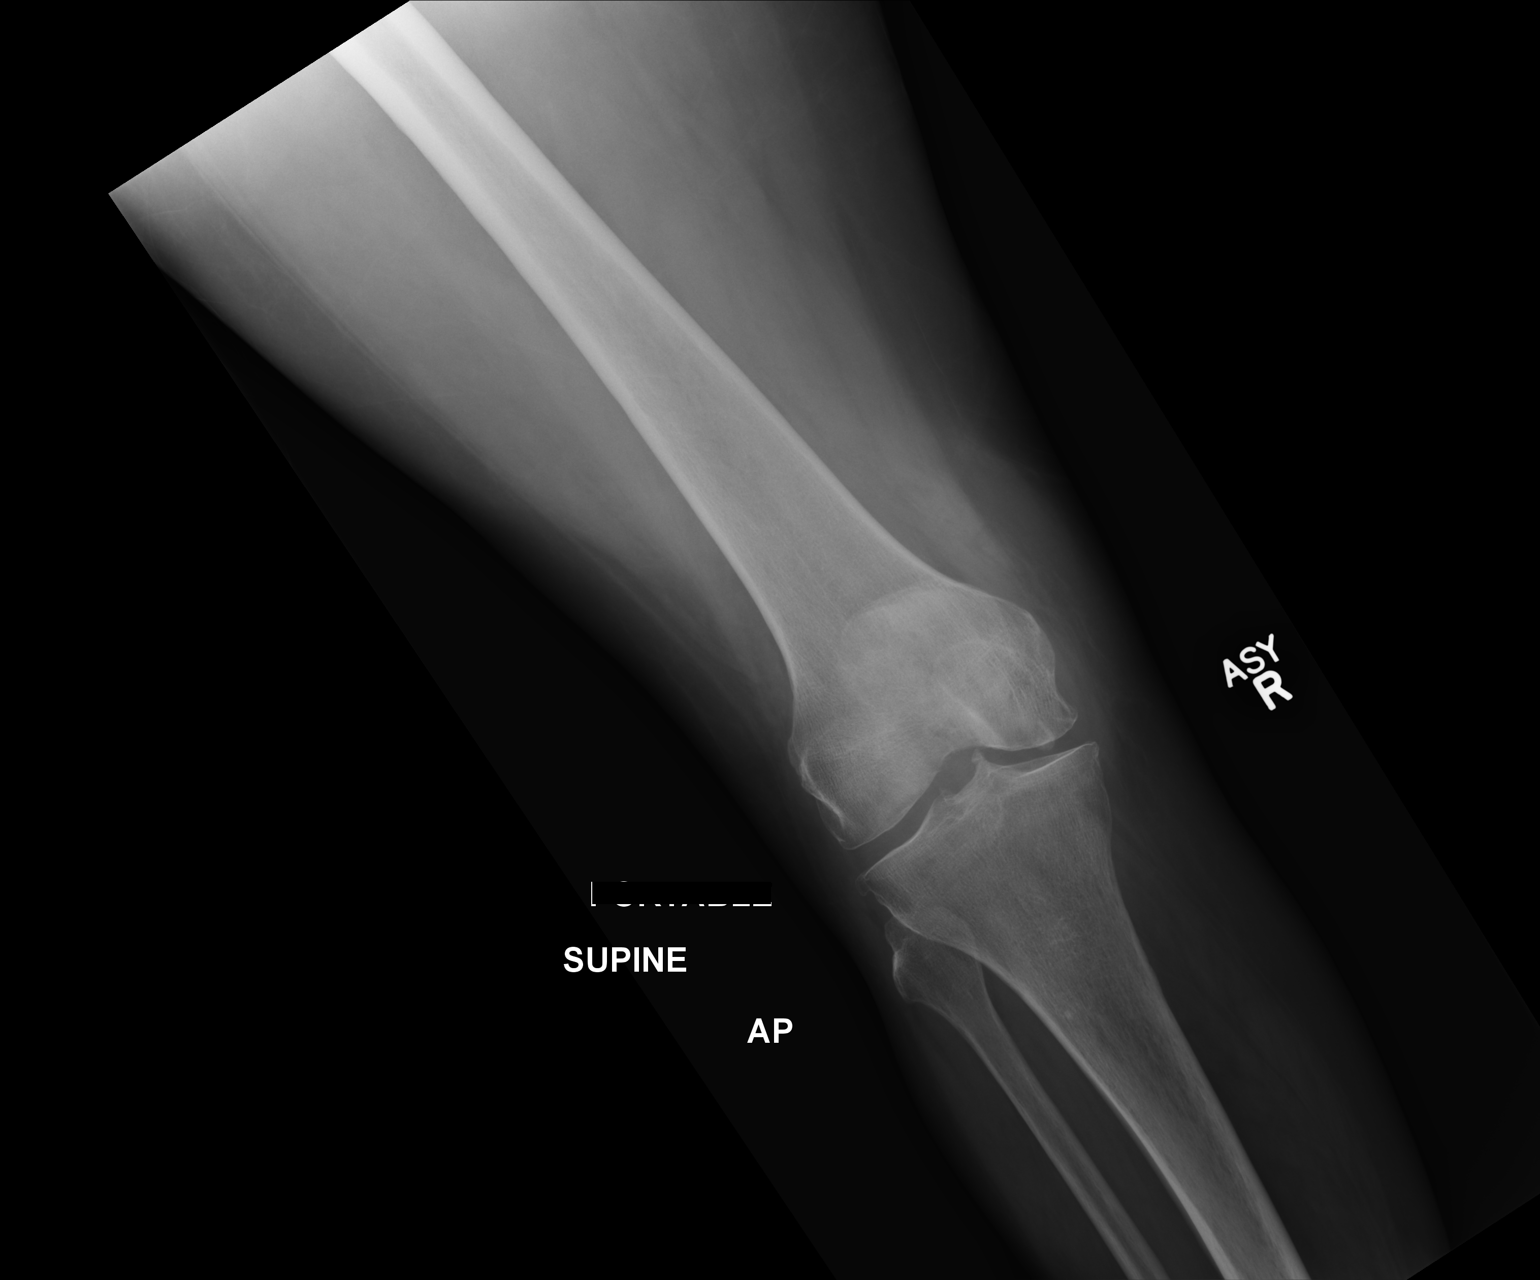

[lateral]
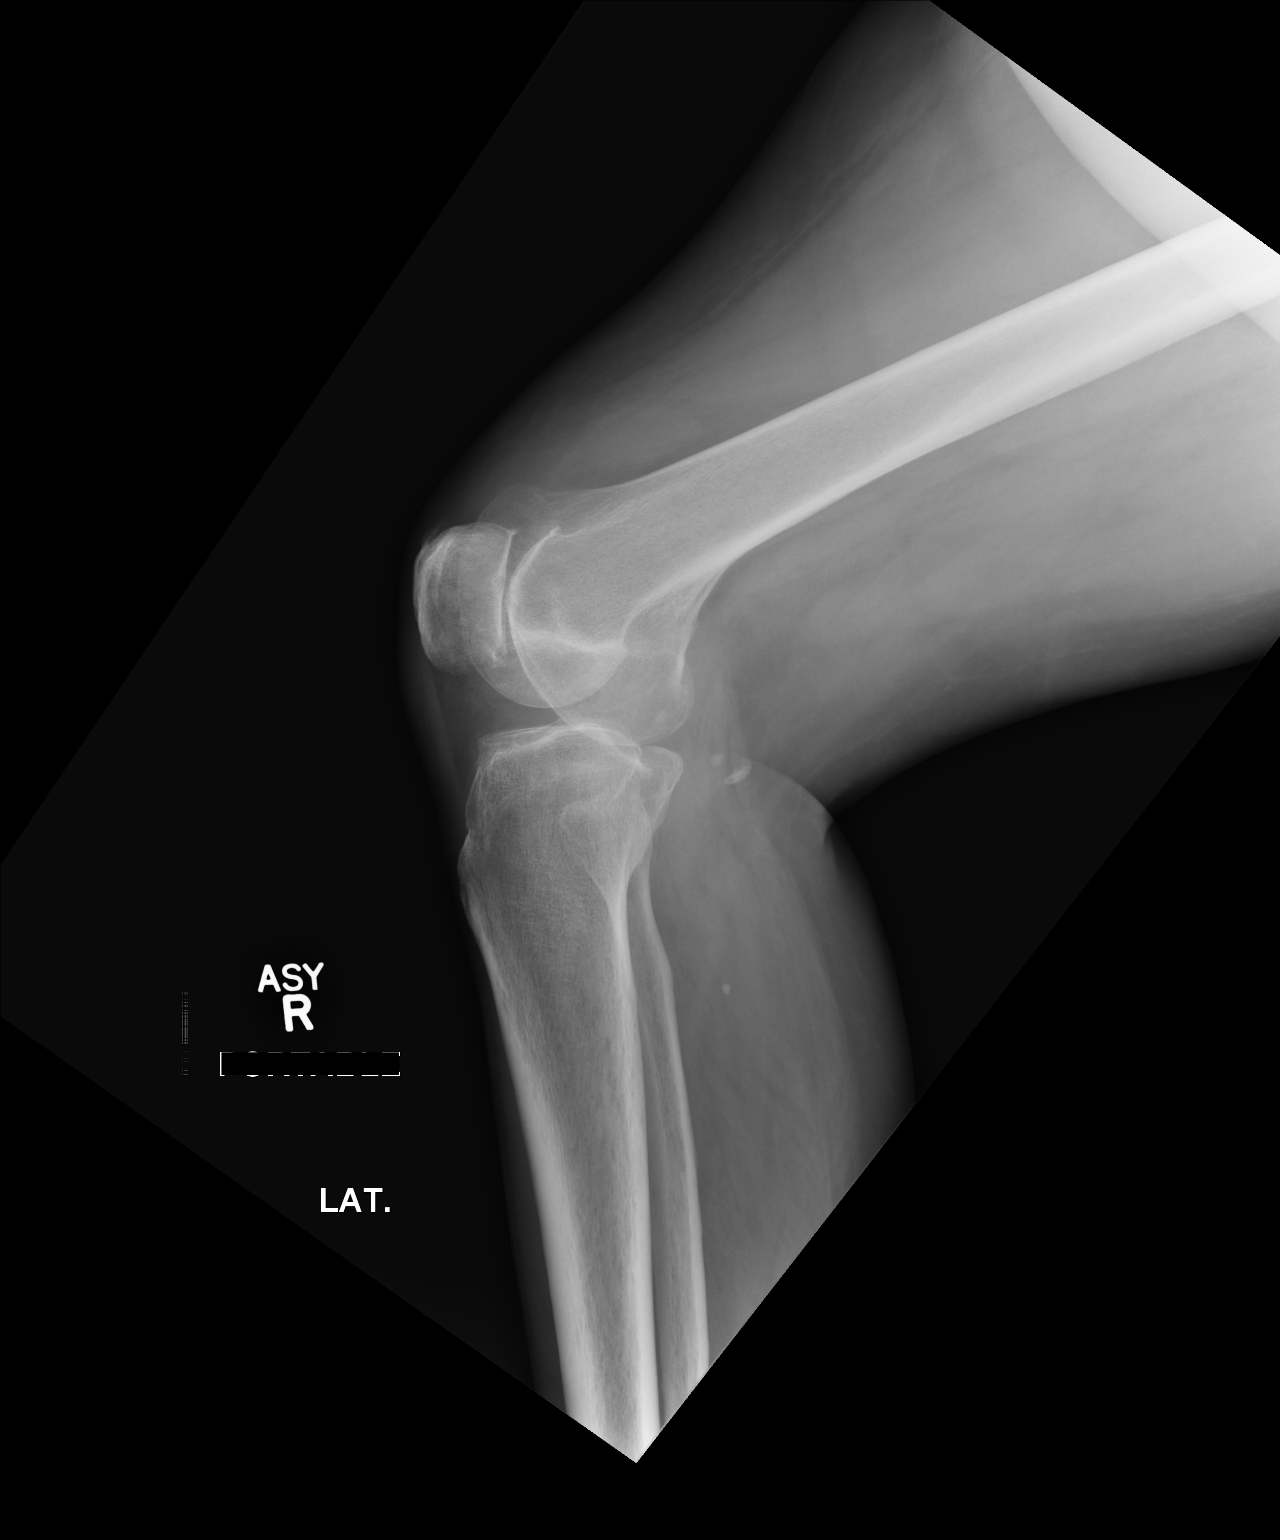

[2 of 2 positions shown; findings below may reference images not displayed]

FINDINGS: No acute fracture or dislocation is noted. Mild degenerative changes
are noted in the medial joint space and patellofemoral space. A
small joint effusion is seen. No other soft tissue abnormality is
noted.
IMPRESSION: Mild degenerative change with small joint effusion.

## 2016-06-26 DIAGNOSIS — L97324 Non-pressure chronic ulcer of left ankle with necrosis of bone: Secondary | ICD-10-CM | POA: Insufficient documentation

## 2016-07-27 DIAGNOSIS — Z09 Encounter for follow-up examination after completed treatment for conditions other than malignant neoplasm: Secondary | ICD-10-CM | POA: Insufficient documentation

## 2016-08-13 DIAGNOSIS — L89320 Pressure ulcer of left buttock, unstageable: Secondary | ICD-10-CM | POA: Insufficient documentation

## 2016-11-09 DIAGNOSIS — M12812 Other specific arthropathies, not elsewhere classified, left shoulder: Secondary | ICD-10-CM | POA: Insufficient documentation

## 2017-06-16 ENCOUNTER — Ambulatory Visit (INDEPENDENT_AMBULATORY_CARE_PROVIDER_SITE_OTHER): Payer: Medicare Other | Admitting: Orthopedic Surgery

## 2017-06-27 ENCOUNTER — Encounter (INDEPENDENT_AMBULATORY_CARE_PROVIDER_SITE_OTHER): Payer: Self-pay | Admitting: Orthopedic Surgery

## 2017-06-27 ENCOUNTER — Ambulatory Visit (INDEPENDENT_AMBULATORY_CARE_PROVIDER_SITE_OTHER): Payer: Medicare Other | Admitting: Orthopedic Surgery

## 2017-06-27 ENCOUNTER — Ambulatory Visit (INDEPENDENT_AMBULATORY_CARE_PROVIDER_SITE_OTHER): Payer: Medicare Other

## 2017-06-27 DIAGNOSIS — M25511 Pain in right shoulder: Secondary | ICD-10-CM

## 2017-06-27 DIAGNOSIS — G8929 Other chronic pain: Secondary | ICD-10-CM | POA: Diagnosis not present
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
## 2018-02-02 ENCOUNTER — Emergency Department (HOSPITAL_COMMUNITY): Payer: Medicare Other

## 2018-02-02 ENCOUNTER — Encounter (HOSPITAL_COMMUNITY): Payer: Self-pay | Admitting: Emergency Medicine

## 2018-02-02 ENCOUNTER — Ambulatory Visit (HOSPITAL_COMMUNITY): Payer: Medicare Other

## 2018-02-02 ENCOUNTER — Inpatient Hospital Stay (HOSPITAL_COMMUNITY)
Admission: EM | Admit: 2018-02-02 | Discharge: 2018-02-09 | DRG: 689 | Disposition: A | Discharge status: Skilled Nursing Facility | Payer: Medicare Other | Attending: Internal Medicine | Admitting: Internal Medicine

## 2018-02-02 DIAGNOSIS — N1330 Unspecified hydronephrosis: Secondary | ICD-10-CM

## 2018-02-02 DIAGNOSIS — N7011 Chronic salpingitis: Secondary | ICD-10-CM | POA: Diagnosis present

## 2018-02-02 DIAGNOSIS — M25511 Pain in right shoulder: Secondary | ICD-10-CM | POA: Diagnosis present

## 2018-02-02 DIAGNOSIS — N136 Pyonephrosis: Secondary | ICD-10-CM | POA: Diagnosis present

## 2018-02-02 DIAGNOSIS — N838 Other noninflammatory disorders of ovary, fallopian tube and broad ligament: Secondary | ICD-10-CM

## 2018-02-02 DIAGNOSIS — T402X5A Adverse effect of other opioids, initial encounter: Secondary | ICD-10-CM | POA: Diagnosis present

## 2018-02-02 DIAGNOSIS — I130 Hypertensive heart and chronic kidney disease with heart failure and stage 1 through stage 4 chronic kidney disease, or unspecified chronic kidney disease: Secondary | ICD-10-CM | POA: Diagnosis present

## 2018-02-02 DIAGNOSIS — M1990 Unspecified osteoarthritis, unspecified site: Secondary | ICD-10-CM | POA: Diagnosis present

## 2018-02-02 DIAGNOSIS — R319 Hematuria, unspecified: Secondary | ICD-10-CM | POA: Diagnosis present

## 2018-02-02 DIAGNOSIS — N3289 Other specified disorders of bladder: Secondary | ICD-10-CM | POA: Diagnosis not present

## 2018-02-02 DIAGNOSIS — E1122 Type 2 diabetes mellitus with diabetic chronic kidney disease: Secondary | ICD-10-CM | POA: Diagnosis present

## 2018-02-02 DIAGNOSIS — H409 Unspecified glaucoma: Secondary | ICD-10-CM | POA: Diagnosis present

## 2018-02-02 DIAGNOSIS — I5030 Unspecified diastolic (congestive) heart failure: Secondary | ICD-10-CM | POA: Diagnosis not present

## 2018-02-02 DIAGNOSIS — Z960 Presence of urogenital implants: Secondary | ICD-10-CM | POA: Diagnosis not present

## 2018-02-02 DIAGNOSIS — J150 Pneumonia due to Klebsiella pneumoniae: Secondary | ICD-10-CM | POA: Diagnosis present

## 2018-02-02 DIAGNOSIS — K5641 Fecal impaction: Secondary | ICD-10-CM | POA: Diagnosis present

## 2018-02-02 DIAGNOSIS — R1900 Intra-abdominal and pelvic swelling, mass and lump, unspecified site: Secondary | ICD-10-CM | POA: Diagnosis present

## 2018-02-02 DIAGNOSIS — R109 Unspecified abdominal pain: Secondary | ICD-10-CM | POA: Diagnosis not present

## 2018-02-02 DIAGNOSIS — Z79899 Other long term (current) drug therapy: Secondary | ICD-10-CM | POA: Diagnosis not present

## 2018-02-02 DIAGNOSIS — B961 Klebsiella pneumoniae [K. pneumoniae] as the cause of diseases classified elsewhere: Secondary | ICD-10-CM | POA: Diagnosis not present

## 2018-02-02 DIAGNOSIS — I110 Hypertensive heart disease with heart failure: Secondary | ICD-10-CM | POA: Diagnosis not present

## 2018-02-02 DIAGNOSIS — Z87891 Personal history of nicotine dependence: Secondary | ICD-10-CM

## 2018-02-02 DIAGNOSIS — N281 Cyst of kidney, acquired: Secondary | ICD-10-CM | POA: Diagnosis present

## 2018-02-02 DIAGNOSIS — R330 Drug induced retention of urine: Secondary | ICD-10-CM | POA: Diagnosis present

## 2018-02-02 DIAGNOSIS — Z79890 Hormone replacement therapy (postmenopausal): Secondary | ICD-10-CM

## 2018-02-02 DIAGNOSIS — D72829 Elevated white blood cell count, unspecified: Secondary | ICD-10-CM | POA: Diagnosis not present

## 2018-02-02 DIAGNOSIS — R159 Full incontinence of feces: Secondary | ICD-10-CM | POA: Diagnosis present

## 2018-02-02 DIAGNOSIS — R9389 Abnormal findings on diagnostic imaging of other specified body structures: Secondary | ICD-10-CM | POA: Diagnosis present

## 2018-02-02 DIAGNOSIS — Z1612 Extended spectrum beta lactamase (ESBL) resistance: Secondary | ICD-10-CM | POA: Diagnosis present

## 2018-02-02 DIAGNOSIS — Z794 Long term (current) use of insulin: Secondary | ICD-10-CM

## 2018-02-02 DIAGNOSIS — I5032 Chronic diastolic (congestive) heart failure: Secondary | ICD-10-CM | POA: Diagnosis present

## 2018-02-02 DIAGNOSIS — Z79891 Long term (current) use of opiate analgesic: Secondary | ICD-10-CM | POA: Diagnosis not present

## 2018-02-02 DIAGNOSIS — E1142 Type 2 diabetes mellitus with diabetic polyneuropathy: Secondary | ICD-10-CM | POA: Diagnosis present

## 2018-02-02 DIAGNOSIS — N83202 Unspecified ovarian cyst, left side: Secondary | ICD-10-CM | POA: Diagnosis present

## 2018-02-02 DIAGNOSIS — N189 Chronic kidney disease, unspecified: Secondary | ICD-10-CM | POA: Diagnosis present

## 2018-02-02 DIAGNOSIS — R32 Unspecified urinary incontinence: Secondary | ICD-10-CM | POA: Diagnosis present

## 2018-02-02 DIAGNOSIS — M109 Gout, unspecified: Secondary | ICD-10-CM | POA: Diagnosis present

## 2018-02-02 DIAGNOSIS — E119 Type 2 diabetes mellitus without complications: Secondary | ICD-10-CM | POA: Diagnosis not present

## 2018-02-02 DIAGNOSIS — Z7901 Long term (current) use of anticoagulants: Secondary | ICD-10-CM

## 2018-02-02 DIAGNOSIS — R339 Retention of urine, unspecified: Secondary | ICD-10-CM | POA: Diagnosis not present

## 2018-02-02 DIAGNOSIS — Z1624 Resistance to multiple antibiotics: Secondary | ICD-10-CM | POA: Diagnosis present

## 2018-02-02 DIAGNOSIS — G8929 Other chronic pain: Secondary | ICD-10-CM

## 2018-02-02 DIAGNOSIS — I4891 Unspecified atrial fibrillation: Secondary | ICD-10-CM | POA: Diagnosis present

## 2018-02-02 DIAGNOSIS — Z885 Allergy status to narcotic agent status: Secondary | ICD-10-CM

## 2018-02-02 DIAGNOSIS — N390 Urinary tract infection, site not specified: Secondary | ICD-10-CM

## 2018-02-02 DIAGNOSIS — Z87440 Personal history of urinary (tract) infections: Secondary | ICD-10-CM

## 2018-02-02 DIAGNOSIS — R935 Abnormal findings on diagnostic imaging of other abdominal regions, including retroperitoneum: Secondary | ICD-10-CM | POA: Diagnosis present

## 2018-02-02 DIAGNOSIS — Z7401 Bed confinement status: Secondary | ICD-10-CM

## 2018-02-02 DIAGNOSIS — L89150 Pressure ulcer of sacral region, unstageable: Secondary | ICD-10-CM | POA: Diagnosis present

## 2018-02-02 DIAGNOSIS — R1032 Left lower quadrant pain: Secondary | ICD-10-CM | POA: Diagnosis not present

## 2018-02-02 DIAGNOSIS — Z841 Family history of disorders of kidney and ureter: Secondary | ICD-10-CM

## 2018-02-02 LAB — COMPREHENSIVE METABOLIC PANEL
ALT: 54 U/L — ABNORMAL HIGH (ref 0–44)
ANION GAP: 7 (ref 5–15)
AST: 55 U/L — AB (ref 15–41)
Albumin: 2.6 g/dL — ABNORMAL LOW (ref 3.5–5.0)
Alkaline Phosphatase: 210 U/L — ABNORMAL HIGH (ref 38–126)
BUN: 47 mg/dL — ABNORMAL HIGH (ref 8–23)
CHLORIDE: 111 mmol/L (ref 98–111)
CO2: 22 mmol/L (ref 22–32)
Calcium: 9.3 mg/dL (ref 8.9–10.3)
Creatinine, Ser: 1.49 mg/dL — ABNORMAL HIGH (ref 0.44–1.00)
GFR calc Af Amer: 39 mL/min — ABNORMAL LOW (ref >60)
GFR calc non Af Amer: 33 mL/min — ABNORMAL LOW (ref >60)
Glucose, Bld: 88 mg/dL (ref 70–99)
POTASSIUM: 4.2 mmol/L (ref 3.5–5.1)
SODIUM: 140 mmol/L (ref 135–145)
Total Bilirubin: 0.3 mg/dL (ref 0.3–1.2)
Total Protein: 6.7 g/dL (ref 6.5–8.1)

## 2018-02-02 LAB — URINALYSIS, ROUTINE W REFLEX MICROSCOPIC
Bilirubin Urine: NEGATIVE
Glucose, UA: NEGATIVE mg/dL
KETONES UR: NEGATIVE mg/dL
NITRITE: POSITIVE — AB
PH: 6 (ref 5.0–8.0)
Protein, ur: 30 mg/dL — AB
SPECIFIC GRAVITY, URINE: 1.01 (ref 1.005–1.030)

## 2018-02-02 LAB — CBC WITH DIFFERENTIAL/PLATELET
Abs Immature Granulocytes: 0.1 10*3/uL (ref 0.0–0.1)
BASOS ABS: 0.1 10*3/uL (ref 0.0–0.1)
Basophils Relative: 1 %
EOS ABS: 0.4 10*3/uL (ref 0.0–0.7)
EOS PCT: 4 %
HCT: 32.8 % — ABNORMAL LOW (ref 36.0–46.0)
Hemoglobin: 10.6 g/dL — ABNORMAL LOW (ref 12.0–15.0)
IMMATURE GRANULOCYTES: 1 %
LYMPHS PCT: 26 %
Lymphs Abs: 2.9 10*3/uL (ref 0.7–4.0)
MCH: 28.3 pg (ref 26.0–34.0)
MCHC: 32.3 g/dL (ref 30.0–36.0)
MCV: 87.5 fL (ref 78.0–100.0)
Monocytes Absolute: 1 10*3/uL (ref 0.1–1.0)
Monocytes Relative: 9 %
NEUTROS PCT: 59 %
Neutro Abs: 6.5 10*3/uL (ref 1.7–7.7)
PLATELETS: 581 10*3/uL — AB (ref 150–400)
RBC: 3.75 MIL/uL — ABNORMAL LOW (ref 3.87–5.11)
RDW: 17.2 % — AB (ref 11.5–15.5)
WBC: 11 10*3/uL — ABNORMAL HIGH (ref 4.0–10.5)

## 2018-02-02 LAB — URINALYSIS, MICROSCOPIC (REFLEX)

## 2018-02-02 LAB — LIPASE, BLOOD: Lipase: 27 U/L (ref 11–51)

## 2018-02-02 LAB — GLUCOSE, CAPILLARY
Glucose-Capillary: 79 mg/dL (ref 70–99)
Glucose-Capillary: 198 mg/dL — ABNORMAL HIGH (ref 70–99)

## 2018-02-02 MED ORDER — POLYETHYLENE GLYCOL 3350 17 G PO PACK: 17.0000 g | PACK | Freq: Every day | ORAL | Status: DC | PRN

## 2018-02-02 MED ORDER — ACETAMINOPHEN 650 MG RE SUPP: 650.0000 mg | Freq: Four times a day (QID) | RECTAL | Status: DC | PRN

## 2018-02-02 MED ORDER — PANTOPRAZOLE SODIUM 40 MG PO TBEC
40.0000 mg | DELAYED_RELEASE_TABLET | Freq: Every day | ORAL | Status: DC
  Administered 2018-02-03 – 2018-02-09 (×7): 40 mg via ORAL
  Filled 2018-02-02 (×7): qty 1

## 2018-02-02 MED ORDER — LEVOTHYROXINE SODIUM 75 MCG PO TABS
75.0000 ug | ORAL_TABLET | Freq: Every day | ORAL | Status: DC
  Administered 2018-02-03 – 2018-02-09 (×7): 75 ug via ORAL
  Filled 2018-02-02 (×7): qty 1

## 2018-02-02 MED ORDER — SODIUM CHLORIDE 0.9 % IV SOLN
1.0000 g | Freq: Two times a day (BID) | INTRAVENOUS | Status: AC
  Administered 2018-02-02 – 2018-02-04 (×5): 1 g via INTRAVENOUS
  Filled 2018-02-02 (×5): qty 1

## 2018-02-02 MED ORDER — ASPIRIN EC 81 MG PO TBEC
81.0000 mg | DELAYED_RELEASE_TABLET | Freq: Every day | ORAL | Status: DC
  Administered 2018-02-02 – 2018-02-09 (×8): 81 mg via ORAL
  Filled 2018-02-02 (×8): qty 1

## 2018-02-02 MED ORDER — SIMVASTATIN 20 MG PO TABS
20.0000 mg | ORAL_TABLET | Freq: Every day | ORAL | Status: DC
  Administered 2018-02-02: 20 mg via ORAL
  Filled 2018-02-02: qty 1

## 2018-02-02 MED ORDER — ACETAMINOPHEN 325 MG PO TABS
650.0000 mg | ORAL_TABLET | Freq: Four times a day (QID) | ORAL | Status: DC | PRN
  Administered 2018-02-03 – 2018-02-04 (×2): 650 mg via ORAL
  Filled 2018-02-02 (×2): qty 2

## 2018-02-02 MED ORDER — INSULIN ASPART 100 UNIT/ML ~~LOC~~ SOLN
0.0000 [IU] | Freq: Three times a day (TID) | SUBCUTANEOUS | Status: DC
  Administered 2018-02-03: 2 [IU] via SUBCUTANEOUS
  Administered 2018-02-04 (×2): 1 [IU] via SUBCUTANEOUS
  Administered 2018-02-04 – 2018-02-05 (×4): 2 [IU] via SUBCUTANEOUS
  Administered 2018-02-06: 1 [IU] via SUBCUTANEOUS
  Administered 2018-02-06: 2 [IU] via SUBCUTANEOUS
  Administered 2018-02-06: 1 [IU] via SUBCUTANEOUS
  Administered 2018-02-07: 2 [IU] via SUBCUTANEOUS
  Administered 2018-02-07: 1 [IU] via SUBCUTANEOUS
  Administered 2018-02-07: 2 [IU] via SUBCUTANEOUS
  Administered 2018-02-08: 1 [IU] via SUBCUTANEOUS
  Administered 2018-02-08: 3 [IU] via SUBCUTANEOUS
  Administered 2018-02-08: 1 [IU] via SUBCUTANEOUS
  Administered 2018-02-09: 2 [IU] via SUBCUTANEOUS

## 2018-02-02 MED ORDER — COLCHICINE 0.6 MG PO TABS
0.6000 mg | ORAL_TABLET | Freq: Every day | ORAL | Status: DC
  Administered 2018-02-03 – 2018-02-09 (×7): 0.6 mg via ORAL
  Filled 2018-02-02 (×7): qty 1

## 2018-02-02 MED ORDER — SODIUM CHLORIDE 0.9 % IV SOLN
INTRAVENOUS | Status: DC
  Administered 2018-02-02 – 2018-02-03 (×2): via INTRAVENOUS

## 2018-02-02 MED ORDER — ALLOPURINOL 100 MG PO TABS
100.0000 mg | ORAL_TABLET | Freq: Every day | ORAL | Status: DC
  Administered 2018-02-03 – 2018-02-09 (×7): 100 mg via ORAL
  Filled 2018-02-02 (×7): qty 1

## 2018-02-02 MED ORDER — TRAMADOL HCL 50 MG PO TABS
50.0000 mg | ORAL_TABLET | Freq: Four times a day (QID) | ORAL | Status: DC | PRN
  Administered 2018-02-02 – 2018-02-08 (×8): 50 mg via ORAL
  Filled 2018-02-02 (×8): qty 1

## 2018-02-02 MED ORDER — DILTIAZEM HCL 60 MG PO TABS
120.0000 mg | ORAL_TABLET | Freq: Every day | ORAL | Status: DC
  Administered 2018-02-03 – 2018-02-09 (×7): 120 mg via ORAL
  Filled 2018-02-02 (×8): qty 2

## 2018-02-02 MED ORDER — IOPAMIDOL (ISOVUE-300) INJECTION 61%
INTRAVENOUS | Status: AC
  Administered 2018-02-02: 100 mL via INTRAVENOUS
  Filled 2018-02-02: qty 100

## 2018-02-02 NOTE — ED Notes (Signed)
Pt in CT.
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
## 2018-02-02 NOTE — Progress Notes (Signed)
Received patient from ED. Patient alert and oriented. Resting comfortably in bed. Patient educated on call bell. Will continue to monitor.

## 2018-02-02 NOTE — ED Triage Notes (Signed)
Pt arrived via EMS from [facility]. Pt having chronic right sided pain; pt's dtr called EMS for evaluation. Pt told EMS she has edema in right lower leg and pain in right shoulder. BP 190/88, HR 96, resp 16, CBG 90. Pt alert and oriented. Pt on contact precautions at facility for ESBL in urine.
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
## 2018-02-02 NOTE — Progress Notes (Signed)
Pharmacy Antibiotic Note  Sarah Colon is a 72 y.o. female admitted on 02/02/2018 with reported ESBL UTI currently being treated at nursing facility with ertapenem (day #2).  Pharmacy has been consulted for meropenem dosing. Afebrile, WBC 11. SCr 1.49 on admit, CrCl~35-40.  Plan: Meropenem 1g IV q12h Monitor clinical progress, c/s, renal function F/u de-escalation plan/LOT      Temp (24hrs), Avg:98.3 F (36.8 C), Min:98.3 F (36.8 C), Max:98.3 F (36.8 C)  Recent Labs  Lab 02/02/18 1000  WBC 11.0*  CREATININE 1.49*    CrCl cannot be calculated (Unknown ideal weight.).    Allergies  Allergen Reactions  . Codeine Other (See Comments)    Causes shaking    Elicia Lamp, PharmD, BCPS Clinical Pharmacist Clinical phone [phone] Please check AMION for all [place] contact numbers 02/02/2018 1:31 PM
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
## 2018-02-02 NOTE — ED Notes (Signed)
Patient transported to Ultrasound 

## 2018-02-02 NOTE — ED Notes (Signed)
Pt had large BM and urinated in bed. Cleansed pt and changed sheets. Placed pt on purewick. Placed allevyn pads over pt's healing wounds on buttocks.

## 2018-02-02 NOTE — ED Notes (Signed)
Pt care assumed, obtained verbal report.  Pt is resting and appears comfortable.  She is waiting for admit MD.  Her daughter is at bedside.

## 2018-02-03 ENCOUNTER — Other Ambulatory Visit: Payer: Self-pay

## 2018-02-03 DIAGNOSIS — R339 Retention of urine, unspecified: Secondary | ICD-10-CM

## 2018-02-03 DIAGNOSIS — N3289 Other specified disorders of bladder: Secondary | ICD-10-CM

## 2018-02-03 DIAGNOSIS — R9389 Abnormal findings on diagnostic imaging of other specified body structures: Secondary | ICD-10-CM

## 2018-02-03 DIAGNOSIS — Z792 Long term (current) use of antibiotics: Secondary | ICD-10-CM

## 2018-02-03 DIAGNOSIS — Z960 Presence of urogenital implants: Secondary | ICD-10-CM

## 2018-02-03 DIAGNOSIS — G8929 Other chronic pain: Secondary | ICD-10-CM

## 2018-02-03 LAB — GLUCOSE, CAPILLARY
GLUCOSE-CAPILLARY: 131 mg/dL — AB (ref 70–99)
Glucose-Capillary: 88 mg/dL (ref 70–99)
Glucose-Capillary: 179 mg/dL — ABNORMAL HIGH (ref 70–99)
Glucose-Capillary: 143 mg/dL — ABNORMAL HIGH (ref 70–99)

## 2018-02-03 LAB — COMPREHENSIVE METABOLIC PANEL
ALBUMIN: 2.3 g/dL — AB (ref 3.5–5.0)
ALK PHOS: 190 U/L — AB (ref 38–126)
ALT: 47 U/L — AB (ref 0–44)
AST: 46 U/L — ABNORMAL HIGH (ref 15–41)
Anion gap: 8 (ref 5–15)
BUN: 34 mg/dL — ABNORMAL HIGH (ref 8–23)
CALCIUM: 8.9 mg/dL (ref 8.9–10.3)
CO2: 20 mmol/L — ABNORMAL LOW (ref 22–32)
Chloride: 114 mmol/L — ABNORMAL HIGH (ref 98–111)
Creatinine, Ser: 0.94 mg/dL (ref 0.44–1.00)
GFR calc Af Amer: >60 mL/min (ref >60)
GFR calc non Af Amer: 58 mL/min — ABNORMAL LOW (ref >60)
Glucose, Bld: 89 mg/dL (ref 70–99)
Potassium: 3.8 mmol/L (ref 3.5–5.1)
SODIUM: 142 mmol/L (ref 135–145)
Total Bilirubin: 0.4 mg/dL (ref 0.3–1.2)
Total Protein: 6 g/dL — ABNORMAL LOW (ref 6.5–8.1)

## 2018-02-03 LAB — CBC
HCT: 30.9 % — ABNORMAL LOW (ref 36.0–46.0)
Hemoglobin: 10.1 g/dL — ABNORMAL LOW (ref 12.0–15.0)
MCH: 27.9 pg (ref 26.0–34.0)
MCHC: 32.7 g/dL (ref 30.0–36.0)
MCV: 85.4 fL (ref 78.0–100.0)
Platelets: 543 10*3/uL — ABNORMAL HIGH (ref 150–400)
RBC: 3.62 MIL/uL — ABNORMAL LOW (ref 3.87–5.11)
RDW: 16.9 % — AB (ref 11.5–15.5)
WBC: 10.3 10*3/uL (ref 4.0–10.5)

## 2018-02-03 MED ORDER — POLYETHYLENE GLYCOL 3350 17 G PO PACK
17.0000 g | PACK | Freq: Every day | ORAL | Status: DC
  Filled 2018-02-03 (×5): qty 1

## 2018-02-03 MED ORDER — LACTULOSE ENEMA
300.0000 mL | Freq: Once | ORAL | Status: DC
  Filled 2018-02-03 (×2): qty 300

## 2018-02-03 MED ORDER — ENOXAPARIN SODIUM 30 MG/0.3ML ~~LOC~~ SOLN
30.0000 mg | Freq: Once | SUBCUTANEOUS | Status: DC
  Filled 2018-02-03 (×3): qty 0.3

## 2018-02-03 NOTE — Consult Note (Addendum)
[facility] Nurse wound consult note Patient evaluation completed in [place].  No family present. Reason for Consult: Sacral wound Wound type: Unstageable Pressure Injury POA: Yes Measurement: 2 cm x 1.6 cm x unknown depth due to slough in wound bed Wound bed: brown slough, 100% Drainage (amount, consistency, odor) drops of drainage on the sacral foam dressing, no odor, no induration Periwound: Contracted, hypopigmented, scarred from old healed wound(s) Dressing procedure/placement/frequency: Thin hydrocolloid ([facility] #[id number]) every 3 days and prn. Additional care orders: turning every 2 hours with limited time on her back, and floating her heels.  I offered to order an air mattress, but she refused.  Stated she had one for months in the nursing home and hated it.  She also has a hypopigmented, contracted, old healed wound on the right ischium.  Today there is a foam dressing over it.  Please continue the foam dressing to this area. Monitor the wound area(s) for worsening of condition such as: Signs/symptoms of infection,  Increase in size,  Development of or worsening of odor, Development of pain, or increased pain at the affected locations.  Notify the medical team if any of these develop.  Thank you for the consult.  Discussed plan of care with the patient and bedside nurse.  [facility] nurse will not follow at this time.  Please re-consult the [place] team if needed.  Val Riles, RN, MSN, CWOCN, CNS-BC, pager [phone]

## 2018-02-03 NOTE — Progress Notes (Signed)
   Subjective:  Overnight: No acute events.   Today, Sarah Colon was evaluated at bedside and reports she is doing well. She still endorses right shoulder pain only with movement but has been stable and currently denies pain. She also denies fever, chills and is unsure if she has dysuria. She also denies difficulty with urination, abdominal pain, nausea or vomiting.   Objective:  Vital signs in last 24 hours: Vitals:   02/02/18 1700 02/02/18 2118 02/03/18 0500 02/03/18 0625  BP: 135/70 129/75  130/76  Pulse: 91 (!) 111  90  Resp:      Temp: 98.5 F (36.9 C) 97.9 F (36.6 C)  98.3 F (36.8 C)  TempSrc: Oral Oral  Oral  SpO2: 100% 100%  100%  Weight:   67.2 kg    Constitutional: In no apparent distress, lying in bed Cardiovascular: Irregular heart sounds on auscultation. No murmurs, gallops or rubs Respiratory: CTABL, no wheezes, crackles, rhonchi  Abdomen: BS+, suprapubic tenderness on palpation Extremity: Right lower extremity edema 1+ - "chronic per patient"  Assessment/Plan:  Principal Problem:   Abdominal mass Active Problems:   Chronic right shoulder pain   Abnormal abdominal CT scan  Sarah Colon is a 72 year old pleasant African American female with medical history significant for HFpEF EF 60-65% 2016, Hypertension, Type 2 diabetes Mellitus and Recurrent UTIs who presented to the with lower abdomen and back pain. Now found to have left cystic ovary with hydrosalpinx, urinary retention and left hydronephrosis.   Urinary Retention: Her initial CT abdomen was concerning for urinary retention, in addition to supra-pubic tenderness, supra-pubic tenderness and small amounts of urine leaking from the purewick. Post void residual showed 999cc of urine. A foley catheter was immediately placed with 600cc of urine output. Her urinary retention is multifactorial, most likely a constellation of left ovarian cyst, stool impaction, medication (oxybutin & Oxy-IR). I spoke to Urology on the  phone who will see patient but indicated of no urgent need for ureteral stent or invasive urological procedure.  -Foley catheter  -Appreciate Urology reccs -Miralax, Lactulose -Avoid opioids and oxybutin   Left Ovarian Cyst, Left hydronephrosis: Initial CT abdomen revealed left hydronephrosis, tortuous let distal ovary and  left adnexal mass. A follow up pelvic ultrasound revealed 4.6cm x 4.5cm cystic left ovary with hydrosalpinx. Renal function is within normal limit and she does not report changes with urination. Will require outpatient gynecology workup   Endometrial thickening: 9 mm thickened endometrium revealed by TVUS. Will require outpatient follow up by gynecology as this is non-emergent  Urinary Tract Infection: Recently diagnosed with ESBL UTI at her SNF facility and was started on ertapenem.  -Pending urine culture  -Currently on Meropenem   Right Shoulder Pain: She has been having this pain since 05/2017 presumably after removal of a right sided PICC line and has received subacromial infections in the past. At her SNF, she has been Oxy-IR 5mg  for chronic pain.  -Current pain regimen: Ultram 50mg  q6 prn severe pain and Tylenol 650mg   Hypertension: Stable on home diltiazem 120mg  daily  Diabetes Mellitus: On SSI TID wc   Dispo: Anticipated discharge to SNF if urology does not recommend further inpatient workup or management.    Jean Rosenthal, MD 02/03/2018, 1:27 PM Pager: [phone] IMTS PGY-1

## 2018-02-03 NOTE — Progress Notes (Signed)
Date: 02/03/2018  Patient name: Sarah Colon  Medical record number: [id number]  Date of birth: 1945/10/25   I have seen and evaluated Sarah Colon and discussed their care with the Residency Team. Sarah Colon is a 89 non ambulatory woman who resides in a SNF.  Her daughter called EMS to transport her to the hospital due to chronic right shoulder pain.  Her pain began after a PICC line was removed in January of this year.  The pain is only when she lifts that arm and has worsened over the past few months.  It has prevented her from being able to transfer to a wheelchair in her SNF.  She has received injections to treat this as an outpatient.  She has been on ertapenem at the SNF for an ESBL UTI.  In the ED, she complained of suprapubic pain and a CT scan was obtained.  The CT scan showed masslike dilation and thickening and tortuosity of the distal left ureter in the area of the left adnexal region.  There was also left hydronephrosis.  Overall, radiology felt this was suspicious for an ovarian mass which was involving the left ureter.  The ovaries were not well visualized on the CT scan.  It also showed endometrial thickening, distended urinary bladder, and renal parenchymal scarring bilaterally.  The patient was then admitted for further work-up.  She subsequently has had a transabdominal and transvaginal ultrasound which showed a cystic left ovary with hydrosalpinx.  An obstructing mass cannot be excluded.  There is also an incidental finding of a 9 mm endometrial thickness which was abnormal for a woman of her age.  This morning, she has no complaints and feels well.  She has never seen a gynecologist in [place].  She has not noticed any difficulty urinating.  A post void residual was obtained which was significant for urinary retention.  A Foley catheter has been placed.  Vitals:   02/02/18 2118 02/03/18 0625  BP: 129/75 130/76  Pulse: (!) 111 90  Resp:    Temp: 97.9 F (36.6 C) 98.3  F (36.8 C)  SpO2: 100% 100%  NAD, resting in bed irr irr no MRG L CTAB with good air flow ABD +  BS, soft, suprapubic pain on Dr Dorothyann Peng exam Ext no edema  Cr 1.49 - 0.94 Alk phos 210 - 190 AST 55 - 46 (94 in 2017) ALT 54 - 47 (115 in 2017) WBC 11 - 10.3 HgB 10.6 - 10.1 Plts 581 - 543 (795 in 2017)  I personally viewed the EKG and confirmed my reading with the official read. Sinus, nl axis, early repol, ? RAE  Assessment and Plan: I have seen and evaluated the patient as outlined above. I agree with the formulated Assessment and Plan as detailed in the residents' note, with the following changes: Sarah Colon is a 41 non ambulatory woman who resides in a SNF who was transported via EMS to the ambulance at request of her daughter.  She has chronic right shoulder pain and had a plain film on admission which showed no abnormalities.  She has recently been diagnosed with an ESBL UTI and was started on ertapenem in the nursing home.  Due to suprapubic pain, the ED obtained a CT scan which was concerning for an ovarian mass.  She subsequently had a trans-vaginal ultrasound which showed a cystic left ovary with hydrosalpinx.  There was also an incidental finding of a 9 mm endometrial thickness.  She was also found  to have significant post void residual.  1.  Chronic right shoulder pain -this has progressed to interfering with her ability to transfer from bed to wheelchair.  Her plain film in the ED is negative.  She has already started work-up and treatment with her outpatient physicians so we will defer further treatment and investigation to her outpatient physician.  2.  Left cystic ovary with hydrosalpinx and increased endometrial thickness -both of these will need to be worked up as an outpatient with gynecology.  She has not seen gynecology in [place] so we will set her up with an appointment.  3.  Urinary retention -Foley catheter has been inserted.  Her imaging showed renal parenchymal  scarring bilaterally indicating this might be more of a chronic issue.  The only medication on her medication list that could cause urinary retention is her oxycodone.  She has had a Foley catheter in place and we are telephone consulting urology.  4.  Left hydronephrosis -this is due obstruction by the left cystic ovary.  She will be referred to gynecology and we are also curbside in neurology.  5.  Dispo -she will be discharged to SNF as long as urology has no inpatient recommendations.  Bartholomew Crews, MD 9/6/201912:51 PM

## 2018-02-03 NOTE — Evaluation (Signed)
Occupational Therapy Evaluation Patient Details Name: Sarah Colon MRN: [id number] DOB: 04/04/1946 Today's Date: 02/03/2018    History of Present Illness 72 y.o female with a PMHx of CHF, CKD, type II DM, recurrent UTIs, gout and HTN presenting from her nursing home due to chronic right shoulder pain that has worsened over the past few months, suprapubic pain and generalized weakness. Admitted on 02/02/18 for further workup due to abnormal CT scan and abdominal mass.    Clinical Impression   PTA patient resided at [facility].  She reports since shoulder pain she has required increased assistance over the last few months, but prior to able to transfer into wheelchair with minimal assist, self propel wheelchair, and complete ADLs with min assist seated/standing at sink with RW.  She currently requires max to total assistance for LB ADLs and moderate assistance for UB ADLs from bed level, max assistance for bed mobility and min assist to maintain sitting at EOB unsupported.  She will benefit from continued OT services while admitted and recommend SNF rehab at dc in order to address decreased strength, activity tolerance, balance, pain, and ADL status in order to optimize functional and maximize quality of life.  Pt is agreeable to SNF rehab, but verbalizes preference to find a different facility. Will continue to follow while admitted.     Follow Up Recommendations  SNF;Supervision/Assistance - 24 hour    Equipment Recommendations  Other (comment)(TBD at next venue of care)    Recommendations for Other Services PT consult     Precautions / Restrictions Precautions Precautions: Fall Restrictions Weight Bearing Restrictions: No      Mobility Bed Mobility Overal bed mobility: Needs Assistance Bed Mobility: Rolling;Sidelying to Sit;Sit to Sidelying Rolling: Mod assist Sidelying to sit: Max assist;HOB elevated     Sit to sidelying: Total assist General bed mobility comments: Patient requires  verbal and tactile cueing for bed mobility with management of LEs and guiding support of UEs, transitioned to EOB with assist for trunk and LEs, with full assist to return to supine  Transfers                 General transfer comment: deferred due to safety, will require +2 assist    Balance Overall balance assessment: Needs assistance Sitting-balance support: Feet supported;Single extremity supported Sitting balance-Leahy Scale: Poor Sitting balance - Comments: min assist required to maintain balance, losing balance posteriorly with L UE reaching  Postural control: Posterior lean;Right lateral lean                                 ADL either performed or assessed with clinical judgement   ADL Overall ADL's : Needs assistance/impaired Eating/Feeding: Set up;Bed level Eating/Feeding Details (indicate cue type and reason): pt using L hand due to pain in dominant R UE  Grooming: Bed level;Minimal assistance   Upper Body Bathing: Moderate assistance;Bed level   Lower Body Bathing: Total assistance;+2 for physical assistance;Bed level   Upper Body Dressing : Bed level;Maximal assistance   Lower Body Dressing: Total assistance;+2 for physical assistance;Bed level     Toilet Transfer Details (indicate cue type and reason): not completed today d/t safety, but anticipate 2+ assist  Toileting- Clothing Manipulation and Hygiene: Total assistance;Bed level       Functional mobility during ADLs: Maximal assistance(bed mobility only) General ADL Comments: Session limited due to R shoulder pain, completed bed mobility only.  Vision   Vision Assessment?: No apparent visual deficits     Perception     Praxis      Pertinent Vitals/Pain Pain Assessment: Faces Faces Pain Scale: Hurts even more Pain Location: R shoulder Pain Descriptors / Indicators: Constant;Discomfort;Grimacing;Guarding;Aching Pain Intervention(s): Limited activity within patient's  tolerance;Repositioned     Hand Dominance Right   Extremity/Trunk Assessment Upper Extremity Assessment Upper Extremity Assessment: RUE deficits/detail;Generalized weakness RUE Deficits / Details: shoulder FF to ~10 degrees only d/t pain, limited elbow flexion d/t pain in shoulder, WFL hand  RUE: Unable to fully assess due to pain RUE Sensation: decreased light touch RUE Coordination: decreased fine motor;decreased gross motor   Lower Extremity Assessment Lower Extremity Assessment: Defer to PT evaluation   Cervical / Trunk Assessment Cervical / Trunk Assessment: Kyphotic   Communication Communication Communication: No difficulties   Cognition Arousal/Alertness: Awake/alert Behavior During Therapy: WFL for tasks assessed/performed Overall Cognitive Status: No family/caregiver present to determine baseline cognitive functioning Area of Impairment: Memory;Following commands;Safety/judgement;Awareness;Problem solving                     Memory: Decreased recall of precautions;Decreased short-term memory Following Commands: Follows multi-step commands with increased time;Follows one step commands inconsistently;Follows one step commands with increased time Safety/Judgement: Decreased awareness of safety;Decreased awareness of deficits Awareness: Emergent Problem Solving: Slow processing;Decreased initiation;Requires verbal cues;Requires tactile cues;Difficulty sequencing     General Comments  educated on positioning in bed to support R shoulder, encouarged patient to engage in elbow flexion/extension and forearm supination/pronation exercises to increased functional use within tolerance    Exercises     Shoulder Instructions      Home Living Family/patient expects to be discharged to:: Skilled nursing facility                                 Additional Comments: patient reports being in and out of nursing homes/hospitals recently      Prior  Functioning/Environment Level of Independence: Needs assistance  Gait / Transfers Assistance Needed: wc bound, reporting able to self propel without assistance but requires some assist to transfer  ADL's / Homemaking Assistance Needed: requires some assistance with ADLS, prior to shoulder injury able to stand at sink and complete ADLs but since has been bed bound for ADLs            OT Problem List: Decreased strength;Decreased activity tolerance;Decreased range of motion;Impaired balance (sitting and/or standing);Decreased coordination;Decreased cognition;Decreased safety awareness;Decreased knowledge of use of DME or AE;Decreased knowledge of precautions;Impaired UE functional use;Pain      OT Treatment/Interventions: Self-care/ADL training;Therapeutic exercise;Balance training;Patient/family education;Therapeutic activities    OT Goals(Current goals can be found in the care plan section) Acute Rehab OT Goals Patient Stated Goal: to get stronger  OT Goal Formulation: With patient Time For Goal Achievement: 02/17/18 Potential to Achieve Goals: Fair  OT Frequency: Min 2X/week   Barriers to D/C:            Co-evaluation              AM-PAC PT "6 Clicks" Daily Activity     Outcome Measure Help from another person eating meals?: None Help from another person taking care of personal grooming?: A Little Help from another person toileting, which includes using toliet, bedpan, or urinal?: Total Help from another person bathing (including washing, rinsing, drying)?: A Lot Help from another person to put on and taking off  regular upper body clothing?: A Lot Help from another person to put on and taking off regular lower body clothing?: Total 6 Click Score: 13   End of Session Nurse Communication: Mobility status;Patient requests pain meds  Activity Tolerance: Patient tolerated treatment well Patient left: in bed;with call bell/phone within reach;with bed alarm set  OT Visit  Diagnosis: Pain;Muscle weakness (generalized) (M62.81);Other abnormalities of gait and mobility (R26.89) Pain - Right/Left: Right Pain - part of body: Shoulder                Time: 1010-1035 OT Time Calculation (min): 25 min Charges:  OT General Charges $OT Visit: 1 Visit OT Evaluation $OT Eval Moderate Complexity: 1 Mod OT Treatments $Self Care/Home Management : 8-22 mins  Delight Stare, OT Acute Rehabilitation Services Pager [phone] Office [phone]   Delight Stare 02/03/2018, 10:59 AM
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
## 2018-02-03 NOTE — Clinical Social Work Note (Signed)
Clinical Social Work Assessment  Patient Details  Name: Sarah Colon MRN: [id number] Date of Birth: 1945/08/24  Date of referral:  02/03/18               Reason for consult:  Discharge Planning                Permission sought to share information with:    Permission granted to share information::  Yes, Verbal Permission Granted  Name::     [name]  Agency::     Relationship::  Daughter  Contact Information:  [phone]  Housing/Transportation Living arrangements for the past 2 months:  [place] of Information:  Patient, Medical Team Patient Interpreter Needed:  None Criminal Activity/Legal Involvement Pertinent to Current Situation/Hospitalization:  No - Comment as needed Significant Relationships:  Adult Children, Other Family Members Lives with:  Facility Resident Do you feel safe going back to the place where you live?  Yes Need for family participation in patient care:  Yes (Comment)  Care giving concerns:  Patient is a long-term resident at [facility].   Social Worker assessment / plan:  CSW met with patient. No supports at bedside. CSW introduced role and explained that discharge planning would be discussed. Patient confirmed she was admitted from [facility] but is unsure if she will return there at discharge. She stated her daughter is handling everything. CSW encouraged patient to make decision well in advance of discharge so that arrangements can be made. No further concerns. CSW encouraged patient to contact CSW as needed. CSW will continue to follow patient for support and facilitate discharge to SNF once medically stable.  Employment status:  Retired Nurse, adult PT Recommendations:  [place] / Referral to community resources:  [place]  Patient/Family's Response to care:  Patient unsure if she is returning to [facility] or not. Patient's family  supportive and involved in patient's care. Patient appreciated social work intervention.  Patient/Family's Understanding of and Emotional Response to Diagnosis, Current Treatment, and Prognosis:  Patient has a good understanding of the reason for admission and social work consult. Patient appears happy with hospital care.  Emotional Assessment Appearance:  Appears stated age Attitude/Demeanor/Rapport:  Engaged Affect (typically observed):  Appropriate, Calm, Pleasant Orientation:  Oriented to Self, Oriented to Place, Oriented to  Time, Oriented to Situation Alcohol / Substance use:  Never Used Psych involvement (Current and /or in the community):  No (Comment)  Discharge Needs  Concerns to be addressed:  Care Coordination Readmission within the last 30 days:  No Current discharge risk:  None Barriers to Discharge:  Continued Medical Work up   Candie Chroman, LCSW 02/03/2018, 3:38 PM

## 2018-02-03 NOTE — Plan of Care (Signed)
  Problem: Clinical Measurements: Goal: Will remain free from infection Outcome: Progressing   Problem: Activity: Goal: Risk for activity intolerance will decrease Outcome: Progressing   Problem: Nutrition: Goal: Adequate nutrition will be maintained Outcome: Progressing   Problem: Pain Managment: Goal: General experience of comfort will improve Outcome: Progressing   Problem: Skin Integrity: Goal: Risk for impaired skin integrity will decrease Outcome: Progressing   

## 2018-02-04 ENCOUNTER — Inpatient Hospital Stay (HOSPITAL_COMMUNITY): Payer: Medicare Other

## 2018-02-04 LAB — GLUCOSE, CAPILLARY
GLUCOSE-CAPILLARY: 163 mg/dL — AB (ref 70–99)
Glucose-Capillary: 130 mg/dL — ABNORMAL HIGH (ref 70–99)
Glucose-Capillary: 142 mg/dL — ABNORMAL HIGH (ref 70–99)
Glucose-Capillary: 153 mg/dL — ABNORMAL HIGH (ref 70–99)

## 2018-02-04 LAB — BASIC METABOLIC PANEL
Anion gap: 9 (ref 5–15)
BUN: 24 mg/dL — ABNORMAL HIGH (ref 8–23)
CO2: 20 mmol/L — ABNORMAL LOW (ref 22–32)
Calcium: 8.9 mg/dL (ref 8.9–10.3)
Chloride: 115 mmol/L — ABNORMAL HIGH (ref 98–111)
Creatinine, Ser: 0.94 mg/dL (ref 0.44–1.00)
GFR calc non Af Amer: 58 mL/min — ABNORMAL LOW (ref >60)
Glucose, Bld: 128 mg/dL — ABNORMAL HIGH (ref 70–99)
Potassium: 3.9 mmol/L (ref 3.5–5.1)
SODIUM: 144 mmol/L (ref 135–145)

## 2018-02-04 LAB — CBC
HCT: 36.6 % (ref 36.0–46.0)
Hemoglobin: 12.1 g/dL (ref 12.0–15.0)
MCH: 28.6 pg (ref 26.0–34.0)
MCHC: 33.1 g/dL (ref 30.0–36.0)
MCV: 86.5 fL (ref 78.0–100.0)
Platelets: 639 10*3/uL — ABNORMAL HIGH (ref 150–400)
RBC: 4.23 MIL/uL (ref 3.87–5.11)
RDW: 17.4 % — AB (ref 11.5–15.5)
WBC: 14.3 10*3/uL — AB (ref 4.0–10.5)

## 2018-02-04 LAB — CA 125: CANCER ANTIGEN (CA) 125: 19.2 U/mL (ref 0.0–38.1)

## 2018-02-04 MED ORDER — SODIUM CHLORIDE 0.9 % IV SOLN
1.0000 g | Freq: Three times a day (TID) | INTRAVENOUS | Status: DC
  Administered 2018-02-04 – 2018-02-05 (×3): 1 g via INTRAVENOUS
  Filled 2018-02-04 (×4): qty 1

## 2018-02-04 MED ORDER — TAMSULOSIN HCL 0.4 MG PO CAPS
0.4000 mg | ORAL_CAPSULE | Freq: Every day | ORAL | Status: DC
  Administered 2018-02-04 – 2018-02-08 (×5): 0.4 mg via ORAL
  Filled 2018-02-04 (×5): qty 1

## 2018-02-04 MED ORDER — SENNA 8.6 MG PO TABS
1.0000 | ORAL_TABLET | Freq: Every day | ORAL | Status: DC
  Administered 2018-02-04 – 2018-02-08 (×5): 8.6 mg via ORAL
  Filled 2018-02-04 (×6): qty 1

## 2018-02-04 MED ORDER — GABAPENTIN 600 MG PO TABS
600.0000 mg | ORAL_TABLET | Freq: Two times a day (BID) | ORAL | Status: DC
  Administered 2018-02-04 – 2018-02-09 (×12): 600 mg via ORAL
  Filled 2018-02-04 (×12): qty 1

## 2018-02-04 NOTE — Progress Notes (Addendum)
   Subjective:  Overnight: complained of bilateral leg pain and received gabapentin.   Today, Sarah Colon continues to endorse bilateral lower extremity pain which she states that the pain medications (Oxycodone) she was receiving at the SNF helped with the pain. I explained to her that we are being cautious with the opioid because of her urinary retention which contributed to the current problem. Otherwise, she is doing well and denies fevers, chills, abdominal pain, nausea, vomiting, numbness or tingling.   Objective:  Vital signs in last 24 hours: Vitals:   02/03/18 0625 02/03/18 1300 02/03/18 2212 02/04/18 0528  BP: 130/76 (!) 143/66 (!) 149/75 125/66  Pulse: 90 90 83 95  Resp:   17 18  Temp: 98.3 F (36.8 C) 98.7 F (37.1 C) 98.7 F (37.1 C) 98.7 F (37.1 C)  TempSrc: Oral  Oral Oral  SpO2: 100% 100% 100% 99%  Weight:       Const: In NAD presently, lying in bed comfortably  CV: RRR (was irregular yesterday on auscultation), no MGR Resp: CTABL, no wheezes, crackles, rhonchi Abd: BS+, TTP at the suprapubic area Ext: 1.5+ pitting edema at the RLE, trace edema on the LLE GU: Foley catheter in palce  Assessment/Plan:  Principal Problem:   Abdominal mass Active Problems:   Chronic right shoulder pain   Abnormal abdominal CT scan  Sarah Colon is a 72 year old pleasant African American female with medical history significant for HFpEF EF 60-65% 2016, Hypertension, Type 2 diabetes Mellitus and Recurrent UTIs who presented to the with lower abdomen and back pain. Now found to have left cystic ovary with hydrosalpinx, left hydronephrosis and urinary retention s/p foley catheter placement.   Urinary retention Left Hydronephrosis Currently denies abdominal pain but suprapubic tenderness noted on palpation. Foley catheter still in place with 500cc output in lasr 24 hours. Urology is on board and does not recommend urgent ureteral stent placement. Renal function improved and stable with  AM Cr. 0.94.  -Appreciate urology reccs:   *Renal U/S today  *Start tamsulosin  *Voiding trial in 3-4 days -Avoiding opioids and oxybutin  -Continue foley catheter   Constipation: On miralax and senna. Refused lactulose enema   Urinary Tract Infection: Recently diagnosed with ESBL UTI at her SNF facility and was started on ertapenem. I spoke to a facilitator at [facility] who made me aware that the patient only received 1 dose of ertapenem prior to admission. 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   Sarah Rosenthal, MD 02/04/2018, 6:39 AM Pager: [phone] IMTS PGY-1

## 2018-02-04 NOTE — Progress Notes (Addendum)
Pharmacy Antibiotic Note  LEHUA FLORES is a 72 y.o. female admitted on 02/02/2018 with reported ESBL UTI currently being treated at nursing facility with ertapenem (day #2).  Pharmacy has been consulted for meropenem dosing.   Renal function improving, afebrile, WBC up to 14.3.   Plan: Change Merrem to 1gm IV Q8H Monitor renal fxn, clinical progress, abx LOT F/U updated height   Weight: 148 lb 2.4 oz (67.2 kg)  Temp (24hrs), Avg:98.7 F (37.1 C), Min:98.7 F (37.1 C), Max:98.7 F (37.1 C)  Recent Labs  Lab 02/02/18 1000 02/03/18 0450 02/04/18 0553  WBC 11.0* 10.3 14.3*  CREATININE 1.49* 0.94 0.94    CrCl cannot be calculated (Unknown ideal weight.).    Allergies  Allergen Reactions  . Codeine Other (See Comments)    Causes shaking     Invanz 9/4 >> 9/5 Merrem 9/5 >>  9/5 UCx - 50K GNR   Melvin Whiteford D. Mina Marble, PharmD, BCPS, [place] 02/04/2018, 10:02 AM

## 2018-02-04 NOTE — Discharge Summary (Signed)
Name: Sarah Colon MRN: [id number] DOB: January 12, 1946 72 y.o. PCP: Patient, No Pcp Per  Date of Admission: 02/02/2018  9:00 AM Date of Discharge: 02/09/2018 Attending Physician: Bartholomew Crews, MD  Discharge Diagnosis: 1. Urinary retention, Left hydronephrosis  2. Urinary Tract Infection  3. Left ovarian cyst, Endometrial thickening  4. Right shoulder pain  5. Hypertension  6. Diabetes Mellitus  7. Leukocytosis  8. Atrial Fibrillation   Discharge Medications: Allergies as of 02/09/2018      Reactions   Codeine Other (See Comments)   Causes shaking      Medication List    STOP taking these medications   oxybutynin 5 MG tablet Commonly known as:  DITROPAN   oxyCODONE 5 MG immediate release tablet Commonly known as:  Oxy IR/ROXICODONE     TAKE these medications   acetaminophen 325 MG tablet Commonly known as:  TYLENOL Take 2 tablets (650 mg total) by mouth every 6 (six) hours as needed for mild pain (or Fever >/= 101).   acidophilus Caps capsule Take 1 capsule by mouth 4 (four) times daily.   albuterol (2.5 MG/3ML) 0.083% nebulizer solution Commonly known as:  PROVENTIL Take 2.5 mg by nebulization every 4 (four) hours as needed for wheezing or shortness of breath.   allopurinol 100 MG tablet Commonly known as:  ZYLOPRIM Take 1 tablet (100 mg total) by mouth daily.   amLODipine 10 MG tablet Commonly known as:  NORVASC Take 1 tablet (10 mg total) by mouth daily.   colchicine 0.6 MG tablet Take 1 tablet (0.6 mg total) by mouth daily. Take 2 tablets as needed for gout flair   DIABETIC TUSSIN EX 100 MG/5ML syrup Generic drug:  guaifenesin Take 200 mg by mouth every 4 (four) hours as needed for cough.   guaiFENesin 600 MG 12 hr tablet Commonly known as:  MUCINEX Take 1,200 mg by mouth every 12 (twelve) hours as needed for cough or to loosen phlegm.   diltiazem 120 MG tablet Commonly known as:  CARDIZEM Take 120 mg by mouth daily.   ertapenem 1 g  injection Commonly known as:  INVANZ Inject 1 g into the muscle daily. x7 days.   ferrous sulfate 325 (65 FE) MG tablet Take 325 mg by mouth daily.   gabapentin 400 MG capsule Commonly known as:  NEURONTIN Take 400 mg by mouth 3 (three) times daily.   gabapentin 600 MG tablet Commonly known as:  NEURONTIN Take 1 tablet (600 mg total) by mouth 2 (two) times daily.   glucagon 1 MG Solr injection Commonly known as:  GLUCAGEN Inject 1 mg into the muscle as needed for low blood sugar.   insulin aspart 100 UNIT/ML injection Commonly known as:  novoLOG Inject 5-16 Units into the skin 3 (three) times daily before meals. Give 16 units  With meals with an additional 5 units for cbg >=150; give 5 units at hs for cbg >200   insulin detemir 100 UNIT/ML injection Commonly known as:  LEVEMIR Inject 40 Units into the skin every 12 (twelve) hours.   insulin glargine 100 UNIT/ML injection Commonly known as:  LANTUS Inject 0.2 mLs (20 Units total) into the skin at bedtime.   insulin lispro 100 UNIT/ML injection Commonly known as:  HUMALOG Inject 15 Units into the skin 4 (four) times daily -  before meals and at bedtime. Hold for bs <150   levothyroxine 75 MCG tablet Commonly known as:  SYNTHROID, LEVOTHROID Take 75 mcg by mouth daily.  metoprolol tartrate 50 MG tablet Commonly known as:  LOPRESSOR Take 50 mg by mouth 2 (two) times daily.   MULTIVITAMIN PO Take 1 tablet by mouth daily.   ondansetron 4 MG tablet Commonly known as:  ZOFRAN Take 4 mg by mouth every 6 (six) hours as needed for nausea or vomiting.   pantoprazole 40 MG tablet Commonly known as:  PROTONIX Take 40 mg by mouth daily.   pioglitazone 15 MG tablet Commonly known as:  ACTOS Take 15 mg by mouth daily.   polyethylene glycol packet Commonly known as:  MIRALAX / GLYCOLAX Take 17 g by mouth at bedtime.   PROBIOTIC PO Take 1 capsule by mouth daily. x21 days.   rivaroxaban 20 MG Tabs tablet Commonly known  as:  XARELTO Take 20 mg by mouth daily.   simethicone 125 MG chewable tablet Commonly known as:  MYLICON Chew 627 mg by mouth every 6 (six) hours as needed for flatulence.   tamsulosin 0.4 MG Caps capsule Commonly known as:  FLOMAX Take 1 capsule (0.4 mg total) by mouth daily after supper.   traMADol 50 MG tablet Commonly known as:  ULTRAM Take 1 tablet (50 mg total) by mouth every 6 (six) hours as needed for moderate pain or severe pain.   Vitamin D3 5000 units Tabs Take 5,000 Units by mouth every 30 (thirty) days.       Disposition and follow-up:   Ms.Kyndle D Trosper was discharged from [facility] in [place] condition.  At the hospital follow up visit please address:  1. Urinary retention: Likely secondary to chronic oxycodone, oxybutin and burden from left ovarian cyst.          Repeat bladder ultrasounds (<115cc and <285 cc). Will require daily daily bladder scan at least until         Follow up with Urology. Will avoid Opioids and Oxybutin.          UROLOGY follow up on 03/13/2018 at 10:30AM      Endometrial thickening: 9 mm thickened endometrium revealed by TVUS.      Left Ovarian Cyst: 4.6 x 2.2 x 4.5 cm LEFT ovarian cyst with hydrosalpinx                                      GYNECOLOGY outpatient referral order placed  Hematuria: Most likely ureter burden from L ovarian cyst     2.  Labs / imaging needed at time of follow-up: Urinalysis 1-2 weeks after dischrge   3.  Pending labs/ test needing follow-up: None   Follow-up Appointments:  Contact information for follow-up providers    Ceasar Mons, MD In 6 weeks.   Specialty:  Urology Contact information: [address] [place] [address] [phone]            Contact information for after-discharge care    Destination    [facility] .   Service:  Skilled Nursing Contact information: [address] [place] [facility] Course by problem list: 1. Urinary retention, Left hydronephrosis: Ms. Schaad is a 72 year old pleasant African American female with medical history significant for HFpEF EF 60-65% 2016, Hypertension, Type 2 diabetes Mellitus and Recurrent UTIs who presented to the with lower abdomen and back  pain. She also reported of sensation of her bladder feeling full and would have to push on it sometimes to give her the urge to urinate. Also reported of urinary and bowel incontinence as well. Her initial CT abdomen was concerning for urinary retention, in addition to supra-pubic tenderness, and small amounts of urine leaking from the purewick. Post void residual showed 999cc of urine. A foley catheter was immediately placed with 600cc of urine output. Her urinary retention is multifactorial, most likely a constellation of left ovarian cyst, stool impaction, medication (oxybutin & Oxy-IR). Urology was consulted who recommended for patient to be started on tamsulosin. Repeat renal ultrasound revealed Moderate left hydronephrosis. Clinically, she reported of improvement to abdominal pain on the day of discharge. She was started on Tamsulosin during this hospital course and will be discharged with it as well. Repeat bladder scan showed <145m and <2816m   2. Urinary Tract Infection: She was recently diagnosed at her SNF facility with culture positive ESBL UTI and had been receiving ertapenem for this. In the ED, she has leukocytosis to 11, UA revealed small hemoglobin, protein, nitrites and leukocytes. Urine culture grew MDR Klebsiella pneumonia. She completed a 5 day course of Meropenem   3. Left ovarian cyst, Endometrial thickening: Ms. CrGlasperas noted to have a tortuous left distal ovary and  left adnexal mass on the initial CT Abdomen and pelvis. Follow up transvaginal ultrasound revealed 4.6 x 2.2 x 4.5 cm LEFT ovarian cyst with hydrosalpinx and  9 mm thickened endometrium.  She will  require an outpatient Gynecology Appointment  4. Constipation: Ms Sedor was noted to have stool impaction on CT which resolved with bowel regiman.  5. Leukocytosis: Per char review she has chronic leukocytosis with unknown etiology [facility] and also no objective findings suggests a systemic infection. CBC with Diff with no specific lymphocyte or neutrophil predominance. Also no reported history of bone marrow biopsy. This seems to be a chronic issue.  6. Atrial Fibrillation:  Remained Irregular rhythm and resumed home Xarelto 2037maily  7. Right Shoulder Pain:We discontinued Oxycodone as we thought had contributed to her urinary retention. She was started on Ultram 2m62md Tylenol 62mg71m Hypertension:She was maintained on 120mg 52my  9. Diabetes Mellitus:Discharged with home insulin.   Discharge Vitals:   BP (!) 156/94 (BP Location: Left Arm)   Pulse 89   Temp 98.6 F (37 C) (Oral)   Resp 20   Ht 5' (1.524 m)   Wt 70.6 kg   SpO2 100%   BMI 30.40 kg/m   Pertinent Labs, Studies, and Procedures:  02/02/18- CT Abdomen and Pelvis  IMPRESSION: 1. Marked LEFT hydronephrosis. Masslike dilatation and thickening/tortuosity of the distal LEFT ureter, in the LEFT adnexal region. Findings are suspicious for ovarian mass involving the LEFT ureter. The ovaries are not well seen. Further characterization of ovaries is recommended. 2. Uterine fibroids.  Endometrial thickening. 3. Recommend pelvic ultrasound for characterization of ovaries. 4. Thick-walled, enhancing distended urinary bladder. Question of tumor versus infection. 5. Rectosigmoid stool impaction. 6. Cholelithiasis without CT evidence for acute cholecystitis. 7. 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 8. Normal appendix. 9. Enlarged lymph nodes in the external iliac  region, portacaval, and peripancreatic region. 10. Renal parenchymal scarring bilaterally.  02/02/18 Transvaginal Pelvic ultrasound  IMPRESSION: 1. Cystic LEFT ovary with hydrosalpinx, obstructing mass not  excluded. Recommend non emergent gynecologic consultation. 2. 2.9 cm calcified fundal fibroid. 3. **An incidental finding of potential clinical significance has been found. 9 mm endometrial thickness is considered abnormal for an asymptomatic post-menopausal female. Endometrial sampling should be considered to exclude carcinoma.** 4. Debris within urinary bladder.  02/04/18- Renal Ultrasound IMPRESSION: 1. Moderate left hydronephrosis persists. An underlying cause is not seen on this study. 2. The bladder is not well assessed due to decompression with a Foley catheter.  Discharge Instructions: Discharge Instructions    Ambulatory referral to Gynecology   Complete by:  As directed    Left ovarian cyst with left moderate hydronephrosis. She was noted to have urinary retention and the cyst could be a contributing factor.   Thank you   Ambulatory referral to Urology   Complete by:  As directed    Call MD for:  difficulty breathing, headache or visual disturbances   Complete by:  As directed    Call MD for:  extreme fatigue   Complete by:  As directed    Call MD for:  hives   Complete by:  As directed    Call MD for:  persistant dizziness or light-headedness   Complete by:  As directed    Call MD for:  persistant nausea and vomiting   Complete by:  As directed    Call MD for:  redness, tenderness, or signs of infection (pain, swelling, redness, odor or green/yellow discharge around incision site)   Complete by:  As directed    Call MD for:  severe uncontrolled pain   Complete by:  As directed    Call MD for:  temperature >100.4   Complete by:  As directed    Diet - low sodium heart healthy   Complete by:  As directed    Discharge instructions   Complete by:  As directed     Ms. Wilmes,   It was a definitely a pleasure taking care of you here at the hospital. You came in because of a urinary tract infection and we treated you with antibiotics.   Also, we found out that 2 of the medications you were taking (Oxycodone and Oxybytin) were blocking you from urinating so we stopped those medications.   You were also found to have a cyst on your Left ovary and I will make a referral for you to see a Gynecologist. You also have to see the bladder doctor on 03/13/2018 at 10:30AM  Take Care ~Dr. Eileen Stanford   Increase activity slowly   Complete by:  As directed       Signed: Jean Rosenthal, MD 02/09/2018, 12:02 PM   Pager: [phone] [facility]MT[facility] PGY-1

## 2018-02-04 NOTE — Progress Notes (Signed)
  Date: 02/04/2018  Patient name: Sarah Colon  Medical record number: [id number]  Date of birth: February 02, 1946   This patient's plan of care was discussed with the house staff. Please see their note for complete details. I concur with their findings.   Sid Falcon, MD 02/04/2018, 9:38 PM
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
## 2018-02-05 DIAGNOSIS — N838 Other noninflammatory disorders of ovary, fallopian tube and broad ligament: Secondary | ICD-10-CM

## 2018-02-05 DIAGNOSIS — N390 Urinary tract infection, site not specified: Secondary | ICD-10-CM

## 2018-02-05 DIAGNOSIS — N1330 Unspecified hydronephrosis: Secondary | ICD-10-CM

## 2018-02-05 LAB — BASIC METABOLIC PANEL
Anion gap: 8 (ref 5–15)
BUN: 26 mg/dL — AB (ref 8–23)
CO2: 20 mmol/L — ABNORMAL LOW (ref 22–32)
Calcium: 8.2 mg/dL — ABNORMAL LOW (ref 8.9–10.3)
Chloride: 113 mmol/L — ABNORMAL HIGH (ref 98–111)
Creatinine, Ser: 1.27 mg/dL — ABNORMAL HIGH (ref 0.44–1.00)
GFR calc Af Amer: 47 mL/min — ABNORMAL LOW (ref >60)
GFR, EST NON AFRICAN AMERICAN: 41 mL/min — AB (ref >60)
Glucose, Bld: 147 mg/dL — ABNORMAL HIGH (ref 70–99)
Potassium: 3.8 mmol/L (ref 3.5–5.1)
SODIUM: 141 mmol/L (ref 135–145)

## 2018-02-05 LAB — URINE CULTURE

## 2018-02-05 LAB — GLUCOSE, CAPILLARY
GLUCOSE-CAPILLARY: 157 mg/dL — AB (ref 70–99)
Glucose-Capillary: 156 mg/dL — ABNORMAL HIGH (ref 70–99)
Glucose-Capillary: 159 mg/dL — ABNORMAL HIGH (ref 70–99)
Glucose-Capillary: 154 mg/dL — ABNORMAL HIGH (ref 70–99)

## 2018-02-05 LAB — CBC
HCT: 32.8 % — ABNORMAL LOW (ref 36.0–46.0)
Hemoglobin: 10.8 g/dL — ABNORMAL LOW (ref 12.0–15.0)
MCH: 28.2 pg (ref 26.0–34.0)
MCHC: 32.9 g/dL (ref 30.0–36.0)
MCV: 85.6 fL (ref 78.0–100.0)
PLATELETS: 571 10*3/uL — AB (ref 150–400)
RBC: 3.83 MIL/uL — ABNORMAL LOW (ref 3.87–5.11)
RDW: 17.2 % — ABNORMAL HIGH (ref 11.5–15.5)
WBC: 15.5 10*3/uL — ABNORMAL HIGH (ref 4.0–10.5)

## 2018-02-05 MED ORDER — SODIUM CHLORIDE 0.9 % IV SOLN
1.0000 g | Freq: Two times a day (BID) | INTRAVENOUS | Status: AC
  Administered 2018-02-07 – 2018-02-08 (×4): 1 g via INTRAVENOUS
  Filled 2018-02-05 (×5): qty 1

## 2018-02-05 NOTE — Progress Notes (Signed)
  Date: 02/05/2018  Patient name: Sarah Colon  Medical record number: [id number]  Date of birth: 29-Dec-1945   I have seen and evaluated this patient and I have discussed the plan of care with the house staff. Please see Dr. Nelia Shi note for complete details. I concur with his findings.  Sid Falcon, MD 02/05/2018, 8:27 PM

## 2018-02-05 NOTE — Progress Notes (Signed)
  Subjective: No complaints.  States that she had two large BMs today.  RUS reviewed, which showed stable left hydronephrosis.  Creatinine rose slightly today.  She states that she has not drank much water over the past 48 hours.   Objective: Vital signs in last 24 hours: Temp:  [98.3 F (36.8 C)-98.8 F (37.1 C)] 98.3 F (36.8 C) (09/08 1604) Pulse Rate:  [89-98] 98 (09/08 1604) Resp:  [16-17] 17 (09/08 1604) BP: (129-132)/(68-88) 129/74 (09/08 1604) SpO2:  [99 %-100 %] 100 % (09/08 1604) Weight:  [69.1 kg] 69.1 kg (09/08 0500)  Intake/Output from previous day: 09/07 0701 - 09/08 0700 In: 545 [P.O.:445; IV Piggyback:100] Out: 1300 [Urine:1300]  Intake/Output this shift: Total I/O In: 916.4 [P.O.:724; IV Piggyback:192.4] Out: 350 [Urine:350]  Physical Exam:  General: Alert and oriented CV: RRR, palpable distal pulses Lungs: CTAB, equal chest rise Abdomen: Soft, NTND, no rebound or guarding GU: Foley draining amber urine Ext: NT, No erythema  Lab Results: Recent Labs    02/03/18 0450 02/04/18 0553 02/05/18 0536  HGB 10.1* 12.1 10.8*  HCT 30.9* 36.6 32.8*   BMET Recent Labs    02/04/18 0553 02/05/18 0536  NA 144 141  K 3.9 3.8  CL 115* 113*  CO2 20* 20*  GLUCOSE 128* 147*  BUN 24* 26*  CREATININE 0.94 1.27*  CALCIUM 8.9 8.2*     Studies/Results: US Renal  Result Date: 02/04/2018 CLINICAL DATA:  Hydronephrosis on the left. EXAM: RENAL / URINARY TRACT ULTRASOUND COMPLETE COMPARISON:  CT scan February 02, 2018 FINDINGS: Right Kidney: Length: 9.2 cm.  Increased cortical echogenicity. Left Kidney: Length: 9.3 cm. Persistent moderate hydronephrosis. Increased cortical echogenicity. 11 mm cyst. Bladder: Decompressed with a Foley catheter. IMPRESSION: 1. Moderate left hydronephrosis persists. An underlying cause is not seen on this study. 2. The bladder is not well assessed due to decompression with a Foley catheter. Electronically Signed   By: Dorise Bullion III  M.D   On: 02/04/2018 12:05    Assessment/Plan: 1.  Urinary retention-  Voiding trial ordered for tomorrow. Continue tamsulosin 2.  Left hydronephrosis- RUS shows stable left hydro.  This will likely persist until her adnexal mass is addressed.  So long as her renal function does not dramatically decline, will hold-off on stent placement  3.  Recurrent UTIs-- Urine cx grew MDR Klebsiella.  She is currently on Imipenem 4.  Left adnexal lesion- Gyn assessment pending 5.  Constipation- Continue stool softners    LOS: 3 days   Ellison Hughs, MD [facility] Pager: [phone]  02/05/2018, 5:41 PM

## 2018-02-05 NOTE — Progress Notes (Signed)
Pharmacy Antibiotic Note  Sarah Colon is a 72 y.o. female admitted on 02/02/2018 with reported ESBL UTI currently being treated at nursing facility with ertapenem (day #2).  Pharmacy has been consulted for meropenem dosing.  Urine culture grew ESBL Klebsiella.  Renal function now worsening, afebrile, WBC trended up to 15.5.   Plan: Reduce Merrem to 1gm IV Q12H Monitor renal fxn, abx LOT   Height: 5' (152.4 cm) Weight: 152 lb 5.4 oz (69.1 kg) IBW/kg (Calculated) : 45.5  Temp (24hrs), Avg:98.7 F (37.1 C), Min:98.7 F (37.1 C), Max:98.8 F (37.1 C)  Recent Labs  Lab 02/02/18 1000 02/03/18 0450 02/04/18 0553 02/05/18 0536  WBC 11.0* 10.3 14.3* 15.5*  CREATININE 1.49* 0.94 0.94 1.27*    Estimated Creatinine Clearance: 33.7 mL/min (A) (by C-G formula based on SCr of 1.27 mg/dL (H)).    Allergies  Allergen Reactions  . Codeine Other (See Comments)    Causes shaking     Invanz 9/4 >> 9/5 Merrem 9/5 >>  9/5 UCx - 50K ESBL Kleb pnuemo   Neithan Day D. Mina Marble, PharmD, BCPS, [place] 02/05/2018, 12:38 PM

## 2018-02-05 NOTE — Progress Notes (Signed)
Subjective:  Overnight: No acute events   Today, Sarah Colon was examined at bedside and reported of not feeling well. She states that she had 2 loose stools yesterday but denies any fevers or chills. She also complained of LLQ and suprapubic pain. She was concerned about hematuria that she noticed in her foley bag and was explained that it soul be a result of her ongoing UTI. Otherwise she is tolerating PO intake with intermittent nausea but no vomiting.    Objective:  Vital signs in last 24 hours: Vitals:   02/04/18 1421 02/04/18 2155 02/05/18 0500 02/05/18 0532  BP: 132/90 131/68  132/88  Pulse: 91 89  92  Resp: '16 17  16  ' Temp: 98.7 F (37.1 C) 98.7 F (37.1 C)  98.8 F (37.1 C)  TempSrc: Oral Oral  Oral  SpO2: 100% 99%  100%  Weight:   69.1 kg   Height: 5' (1.524 m)      Const: In NAD, flat affect, lying on bed  CV: RRR, no MGR Resp: CTABL, no wheezes, crackles, rhonchi  Abd: BS+, LLQ and suprapubic tenderness to palpation, no guarding or rigidity.  GU: Foley catheter with hematuria   Assessment/Plan:  Principal Problem:   Abdominal mass Active Problems:   Chronic right shoulder pain   Abnormal abdominal CT scan  Sarah Colon is a 72 year old pleasant African American female with medical history significant for HFpEF EF 60-65% 2016, Hypertension, Type 2 diabetes Mellitus and Recurrent UTIs who presented to the with lower abdomen and back pain. She was noted to have left cystic ovary with hydrosalpinx, left hydronephrosis and urinary retention s/p foley catheter placement.   Urinary retention - resolved Left Hydronephrosis - improved Reported of suprapubic pain this morning. Physical exam elicited tenderness to palpation at the LLQ and suprapubic area. Foley catheter still in place with 1.3L of hematuria and per nurse, it is getting improving and getting clearer. Cr is mildly elevated at 1.27<<0.94. I believe her ongoing complaints and objective findings are a sequale of  her ongoing UTI in addition to left hydronephrosis. I am not too concerned about urinary retention at this juncture as she has a foley in place and a repeat renal U/S yesterday only showed moderate hydronephrosis. -Continue to monitor renal function.  -Appreciate urology reccs -Continue foley   Constipation: Patient was noted to have stool impaction on CT and today reported of of have 2 loose stools yesterday though she was unable to quantify. No report of fevers, chills. Per the nurse, there was no reports of diarrhea. She has an order for miralax and senna as she has previously refused lactulose enema. Presently, she has only been given 1 dose of senna. Even though she is on an antibiotic, I am less inclined this is C.Diff based on history and objective findings. -Continue miralax and senna to help with bowel movement. -Continue to monitor  Leukocytosis: Per char review she has chronic leukocytosis with unknown etiology which has been noted at admission at [facility] as well as [facility]. She currently does not report of fevers and also no objective findings suggests a systemic infection. CBC with Diff with no specific lymphocyte or neutrophil predominance. Also no reported history of bone marrow biopsy.  Urinary Tract Infection:Previous history of ESBL UTI.  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  AgyJean RosenthalD 02/05/2018, 9:37 AM Pager: [phone]  [id number]

## 2018-02-06 DIAGNOSIS — B961 Klebsiella pneumoniae [K. pneumoniae] as the cause of diseases classified elsewhere: Secondary | ICD-10-CM

## 2018-02-06 DIAGNOSIS — N1330 Unspecified hydronephrosis: Secondary | ICD-10-CM

## 2018-02-06 DIAGNOSIS — N7011 Chronic salpingitis: Secondary | ICD-10-CM

## 2018-02-06 DIAGNOSIS — I5030 Unspecified diastolic (congestive) heart failure: Secondary | ICD-10-CM

## 2018-02-06 DIAGNOSIS — N83202 Unspecified ovarian cyst, left side: Secondary | ICD-10-CM

## 2018-02-06 DIAGNOSIS — Z79899 Other long term (current) drug therapy: Secondary | ICD-10-CM

## 2018-02-06 DIAGNOSIS — Z87440 Personal history of urinary (tract) infections: Secondary | ICD-10-CM

## 2018-02-06 DIAGNOSIS — I110 Hypertensive heart disease with heart failure: Secondary | ICD-10-CM

## 2018-02-06 DIAGNOSIS — M25511 Pain in right shoulder: Secondary | ICD-10-CM

## 2018-02-06 DIAGNOSIS — N281 Cyst of kidney, acquired: Secondary | ICD-10-CM

## 2018-02-06 DIAGNOSIS — Z1624 Resistance to multiple antibiotics: Secondary | ICD-10-CM

## 2018-02-06 DIAGNOSIS — N390 Urinary tract infection, site not specified: Secondary | ICD-10-CM

## 2018-02-06 DIAGNOSIS — E119 Type 2 diabetes mellitus without complications: Secondary | ICD-10-CM

## 2018-02-06 LAB — CBC
HCT: 32.6 % — ABNORMAL LOW (ref 36.0–46.0)
Hemoglobin: 11 g/dL — ABNORMAL LOW (ref 12.0–15.0)
MCH: 29.2 pg (ref 26.0–34.0)
MCHC: 33.7 g/dL (ref 30.0–36.0)
MCV: 86.5 fL (ref 78.0–100.0)
PLATELETS: 506 10*3/uL — AB (ref 150–400)
RBC: 3.77 MIL/uL — AB (ref 3.87–5.11)
RDW: 17.5 % — ABNORMAL HIGH (ref 11.5–15.5)
WBC: 12.2 10*3/uL — ABNORMAL HIGH (ref 4.0–10.5)

## 2018-02-06 LAB — BASIC METABOLIC PANEL
Anion gap: 7 (ref 5–15)
BUN: 22 mg/dL (ref 8–23)
CALCIUM: 8.3 mg/dL — AB (ref 8.9–10.3)
CO2: 22 mmol/L (ref 22–32)
CREATININE: 0.99 mg/dL (ref 0.44–1.00)
Chloride: 112 mmol/L — ABNORMAL HIGH (ref 98–111)
GFR calc non Af Amer: 55 mL/min — ABNORMAL LOW (ref >60)
GLUCOSE: 166 mg/dL — AB (ref 70–99)
Potassium: 3.8 mmol/L (ref 3.5–5.1)
Sodium: 141 mmol/L (ref 135–145)

## 2018-02-06 LAB — GLUCOSE, CAPILLARY
Glucose-Capillary: 140 mg/dL — ABNORMAL HIGH (ref 70–99)
Glucose-Capillary: 141 mg/dL — ABNORMAL HIGH (ref 70–99)
Glucose-Capillary: 162 mg/dL — ABNORMAL HIGH (ref 70–99)
Glucose-Capillary: 150 mg/dL — ABNORMAL HIGH (ref 70–99)

## 2018-02-06 NOTE — Progress Notes (Addendum)
Subjective:   Today, Ms Grove was examined at bedside and reports that she is doing a lot better today than yesterday. She still endorses suprapubic pain but with improvement. Currently denies fevers, chills, nausea, vomiting. On further questioning, she reported of a 3 week history of dysuria with subjective fevers prior to being started on ertapenem at her SNF facility. She is tolerating po diet. She has indicated several times of being unfairly treated at her SNF facility and has voiced not wanting to return.   Objective:  Vital signs in last 24 hours: Vitals:   02/05/18 1604 02/05/18 2137 02/06/18 0500 02/06/18 0539  BP: 129/74 (!) 151/74  (!) 150/77  Pulse: 98 93  89  Resp: 17 16  20   Temp: 98.3 F (36.8 C) 99 F (37.2 C)  98.6 F (37 C)  TempSrc: Oral Oral  Oral  SpO2: 100% 100%  100%  Weight:   70.2 kg   Height:       Const: In NAD, pleasantly eating breakfast and lying in bed CV: RRR, no MGR Resp: CTABL, no wheezes, crackles, rhonchi  Abd: BS+, suprapubic and LLQ TTP GU: Foley in place   Assessment/Plan:  Principal Problem:   Abdominal mass Active Problems:   Chronic right shoulder pain   Abnormal abdominal CT scan   Acute lower UTI   Hydronephrosis of left kidney   Ovarian mass, left  Ms. Sidman is a 72 year old pleasant African American female with medical history significant for HFpEF EF 60-65% 2016, Hypertension, Type 2 diabetes Mellitus and Recurrent UTIs who presented to the with lower abdomen and back pain. She was noted to have left cystic ovary with hydrosalpinx,left hydronephrosis andurinary retentions/p foley catheter placement (9/6-9/9)  Urinary retention - resolved Left Hydronephrosis - improved Lower abdominal pain improved but with suprapubic and LLQ tenderness on palpation. Voiding trial today. Renal function has been stable. Per urology, the left hydronephrosis will continue to persist due to the Left renal cyst. Since, her renal functio is  stable, an emergent stent placement is not indicated.  -Continue tamsulosin  -Voiding trial today.   Urinary Tract Infection:UCx with multi-drug resistant klebsiella. Prior Cx with ESBL. Guidelines suggest to treat patients with MDR for 5-14 days depending on clinical response. She does have supra-pubic tenderness which is difficult to delineate if due to ongoing UTI or referred pain from L. adnexal cyst. Also, unable to access symptomology of dysuria due to foley but will continue to follow. I will like to treat for a total of 7 days. -Currently on Meropenem (Day 5)--> end date 02/08/18 -Abx: Ertapenem x1 dose at SNF prior to admission  Left ovarian Cyst Endometrial thickening -4.6cm x 4.5cm cystic left ovary with hydrosalpinx -9 mmthickened endometrium revealed by TVUS -Require outpatient GYN workup  Right Shoulder Pain:Stable -Continue Ultram 50mg  q6 prn severe pain and Tylenol 650mg   Hypertension:Continuediltiazem 120mg  daily  Diabetes Mellitus:ContinueSSI TID wc  Social services: I spoke to daughter regarding placement and she made me aware that she has not started the process of finding a new facility for her mom and will like for me to consult social services. On speaking to the social worker, it is challenging for patients to switch facilities once they are in a long term care. Also, Ms. Claudio has an outstanding balance of $10,000 at [employer] and she will follow up with the facility today and up me.  Dispo: Anticipated discharge pending placement.   Jean Rosenthal, MD 02/06/2018, 10:51 AM Pager:  [phone] IMTS PGY-1

## 2018-02-06 NOTE — Progress Notes (Signed)
  Date: 02/06/2018  Patient name: Sarah Colon  Medical record number: [id number]  Date of birth: April 08, 1946   I have seen and evaluated this patient and I have discussed the plan of care with the house staff. Please see their note for complete details. I concur with their findings.  Bartholomew Crews, MD 02/06/2018, 2:37 PM

## 2018-02-06 NOTE — Plan of Care (Signed)
  Problem: Education: Goal: Knowledge of General Education information will improve Description Including pain rating scale, medication(s)/side effects and non-pharmacologic comfort measures Outcome: Progressing   

## 2018-02-06 NOTE — Social Work (Addendum)
Spoke with pt attending MD care team this morning.  If pt transfers to another SNF will need [facility] Medicare. This authorization will be for short term SNF, pt Medicaid has been approved but if she changes facilities and plans on LTC stay will need to ensure that they have LTC bed available.   Confirmed with prior facility that pt has Medicaid approved- [id number] L   3:36pm- Spoke with pt at bedside; pt given offers of other LTC facilities (2 offers: [facility] and [facility]). CSW attempted to reach out to pt's daughter at number listed [phone] and there was no answer/voice mail box not set up. Will attempt later today as time allows.   Alexander Mt, [place] Work [phone]

## 2018-02-07 DIAGNOSIS — Z79891 Long term (current) use of opiate analgesic: Secondary | ICD-10-CM

## 2018-02-07 DIAGNOSIS — Z7901 Long term (current) use of anticoagulants: Secondary | ICD-10-CM

## 2018-02-07 DIAGNOSIS — I4891 Unspecified atrial fibrillation: Secondary | ICD-10-CM

## 2018-02-07 DIAGNOSIS — R1032 Left lower quadrant pain: Secondary | ICD-10-CM

## 2018-02-07 LAB — BASIC METABOLIC PANEL
ANION GAP: 10 (ref 5–15)
BUN: 22 mg/dL (ref 8–23)
CO2: 20 mmol/L — AB (ref 22–32)
Calcium: 8.5 mg/dL — ABNORMAL LOW (ref 8.9–10.3)
Chloride: 112 mmol/L — ABNORMAL HIGH (ref 98–111)
Creatinine, Ser: 0.89 mg/dL (ref 0.44–1.00)
GFR calc non Af Amer: >60 mL/min (ref >60)
Glucose, Bld: 134 mg/dL — ABNORMAL HIGH (ref 70–99)
POTASSIUM: 3.8 mmol/L (ref 3.5–5.1)
Sodium: 142 mmol/L (ref 135–145)

## 2018-02-07 LAB — GLUCOSE, CAPILLARY
GLUCOSE-CAPILLARY: 154 mg/dL — AB (ref 70–99)
GLUCOSE-CAPILLARY: 152 mg/dL — AB (ref 70–99)
GLUCOSE-CAPILLARY: 150 mg/dL — AB (ref 70–99)
Glucose-Capillary: 141 mg/dL — ABNORMAL HIGH (ref 70–99)

## 2018-02-07 MED ORDER — RIVAROXABAN 20 MG PO TABS
20.0000 mg | ORAL_TABLET | Freq: Every day | ORAL | Status: DC
  Administered 2018-02-07 – 2018-02-08 (×2): 20 mg via ORAL
  Filled 2018-02-07 (×2): qty 1

## 2018-02-07 NOTE — Social Work (Addendum)
Called daughter x2.  Received return call and spoke with pt daughter [name] via telephone, she is requesting other offers to be sent to her via email ([email or web address]).   Sent offers, she is aware that we need choice to start insurance authorization.   1:35pm- Pt daughter selected [facility] bed available they will start insurance authorization for pt through [facility].   2:36pm- Insurance authorization received for pt to discharge to SNF at [facility]. Spoke with attending MD and pt will likely be ready to discharge to SNF tomorrow.   Alexander Mt, [place] Work [phone]

## 2018-02-07 NOTE — Progress Notes (Signed)
Physical Therapy Treatment Patient Details Name: Sarah Colon MRN: [id number] DOB: [birth year] Today's Date: 02/07/2018    History of Present Illness Pt is a 72 y.o. admitted from nursing home on 02/02/18 with c/o chronic R shoulder pain, suprapubic pain and generalized weakness. CT showed masslike dilation in ureter suspicious for ovarian mass. Regarding R shoulder pain, presumably after removal of a right sided PICC line in 05/2017 and has received subacromial injections in the past. PMH includes CHF, CKD, DM, chronic pain, arthritis.     PT Comments    Pt initially hesitant on arrival reporting abdominal pain ,but after speaking with patient able to develop rapport and patient agreeable to transfer training.   Pt performed supine to sit and squat pivot into recliner chair from her bed.  Pt sitting in chair post session and happy to be upright.  Continue to recommend SNF palcement and improve transfer training per POC.    Follow Up Recommendations  SNF;Supervision for mobility/OOB     Equipment Recommendations  None recommended by PT    Recommendations for Other Services       Precautions / Restrictions Precautions Precautions: Fall Restrictions Weight Bearing Restrictions: No    Mobility  Bed Mobility Overal bed mobility: Needs Assistance Bed Mobility: Rolling;Sidelying to Sit;Sit to Sidelying Rolling: Mod assist;+2 for physical assistance Sidelying to sit: Max assist;HOB elevated;+2 for physical assistance       General bed mobility comments: Mod assistance to roll for pericare and max assistance to elevate trunk into sitting and scoot hips to edge of bed.  Pt in sitting began to slide forward to edge of bed and required assistance to scoot posterior to allow for placement of her bottom further back on the mattress.    Transfers Overall transfer level: Needs assistance Equipment used: None Transfers: Sit to/from [employer] Sit to Stand: Total assist;+2  physical assistance(Unable to achieve full standing and required +2 assistance to return back to matress. )   Squat pivot transfers: Max assist;+2 physical assistance(placed chair close and performed squat pivot from bed to recliner chair.  Once in chair required assistance to scoot back into recliner.  )     General transfer comment: +2 max to total assistance for sit to stand and ultimatley squat pivot to achieve placement in recliner chair.    Ambulation/Gait Ambulation/Gait assistance: (NT unable.  )               Stairs             Wheelchair Mobility    Modified Rankin (Stroke Patients Only)       Balance Overall balance assessment: Needs assistance   Sitting balance-Leahy Scale: Poor       Standing balance-Leahy Scale: Fair                              Cognition Arousal/Alertness: Awake/alert Behavior During Therapy: WFL for tasks assessed/performed Overall Cognitive Status: No family/caregiver present to determine baseline cognitive functioning Area of Impairment: Memory;Following commands;Safety/judgement;Awareness;Problem solving                     Memory: Decreased recall of precautions;Decreased short-term memory Following Commands: Follows multi-step commands with increased time;Follows one step commands inconsistently;Follows one step commands with increased time Safety/Judgement: Decreased awareness of safety;Decreased awareness of deficits   Problem Solving: Slow processing;Decreased initiation;Requires verbal cues;Requires tactile cues;Difficulty sequencing  Exercises      General Comments        Pertinent Vitals/Pain Pain Assessment: Faces Faces Pain Scale: Hurts little more Pain Location: Abdomen, groin Pain Descriptors / Indicators: Constant;Discomfort;Grimacing;Guarding;Aching Pain Intervention(s): Monitored during session;Repositioned;Ice applied    Home Living                      Prior  Function            PT Goals (current goals can now be found in the care plan section) Acute Rehab PT Goals Patient Stated Goal: No more shoulder pain Potential to Achieve Goals: Fair Progress towards PT goals: Progressing toward goals    Frequency    Min 2X/week      PT Plan Current plan remains appropriate    Co-evaluation              AM-PAC PT "6 Clicks" Daily Activity  Outcome Measure  Difficulty turning over in bed (including adjusting bedclothes, sheets and blankets)?: Unable Difficulty moving from lying on back to sitting on the side of the bed? : Unable Difficulty sitting down on and standing up from a chair with arms (e.g., wheelchair, bedside commode, etc,.)?: Unable Help needed moving to and from a bed to chair (including a wheelchair)?: Total Help needed walking in hospital room?: Total Help needed climbing 3-5 steps with a railing? : Total 6 Click Score: 6    End of Session Equipment Utilized During Treatment: Gait belt Activity Tolerance: Patient limited by fatigue;Patient limited by pain Patient left: in bed;with call bell/phone within reach Nurse Communication: Mobility status PT Visit Diagnosis: Other abnormalities of gait and mobility (R26.89);Muscle weakness (generalized) (M62.81)     Time: 9470-9628 PT Time Calculation (min) (ACUTE ONLY): 23 min  Charges:  $Therapeutic Activity: 23-37 mins                     Governor Rooks, PTA Acute Rehabilitation Services Pager [phone] Office [phone]     Sarah Colon Eli Hose 02/07/2018, 3:43 PM

## 2018-02-07 NOTE — Discharge Instructions (Signed)

## 2018-02-07 NOTE — Progress Notes (Signed)
   Subjective:   Overnight: Foley catheter removed  Today, Sarah Colon reports she is doing well but still endorses chronic lower abdominal pain which has been present for about 2 months. She has a good appetite but complains of nausea to certain meals but not to desserts. She denies fevers, chills, SOB, palpitation, dysrhythmia, dysphagia. She has had multiple bowel movements since Saturday which provided provided relief.  Objective:  Vital signs in last 24 hours: Vitals:   02/06/18 0539 02/06/18 1329 02/06/18 2249 02/07/18 0528  BP: (!) 150/77 (!) 141/65 (!) 147/72 (!) 141/66  Pulse: 89 91 86 89  Resp: 20 18 18 18   Temp: 98.6 F (37 C) 98.3 F (36.8 C)  98.4 F (36.9 C)  TempSrc: Oral Oral  Axillary  SpO2: 100% 100% 99% 100%  Weight:      Height:       Const: In NAD, lying in bed, purewicc container with yellow urine  CV: Irregular, no MGR Resp: CTABL, no wheezes, crackles, rhonchi  Abd: BS+, suprapubic and LLQ tenderness Ext: 1+ RLE pitting edema, trace edema on LLE    Assessment/Plan:  Principal Problem:   Abdominal mass Active Problems:   Chronic right shoulder pain   Abnormal abdominal CT scan   Acute lower UTI   Hydronephrosis of left kidney   Ovarian mass, left  Sarah Colon is a 72 year old pleasant African American female with medical history significant for HFpEF EF 60-65% 2016, Hypertension, Type 2 diabetes Mellitus and Recurrent UTIs who presented to the with lower abdomen and back pain.She was noted to haveleft cystic ovary with hydrosalpinx,left hydronephrosis andurinary retentions/p foley catheter placement (9/6-9/9)  Urinary retention- resolved Left Hydronephrosis- improved Tolerating voiding trial. Her purewicc catheter had good flow and able to visualize clear urine in container. Still continues to endorse chronic LLQ and suprapubic pain which has been present for 2 months.  -Continue Tamsulosin  -pending PVR  Urinary Tract Infection:Remains  afebrile. LLQ and suprapubic tenderness unchanged from yesterday. -Continue Meropenem(Day 6)--> end date 02/08/18  Atrial Fibrillation: Irregular. Resumed home Xarelto 20mg  daily  Hypertension:Continuediltiazem 120mg  daily  Diabetes Mellitus:ContinueSSI TID wc  Dispo: Anticipated discharge pending SNF placement.   Jean Rosenthal, MD 02/07/2018, 6:49 AM Pager: [phone] IMTS PGY-1

## 2018-02-07 NOTE — Progress Notes (Signed)
  Date: 02/07/2018  Patient name: Sarah Colon  Medical record number: [id number]  Date of birth: 10-27-1945   I have seen and evaluated this patient and I have discussed the plan of care with the house staff. Please see their note for complete details. I concur with their findings with the following additions/corrections: Ms Taormina was seen on rounds with the team.  She continues to have left lower quadrant/suprapubic pain and this is been going on for about 2 months.  It started prior to her dysuria that led to the diagnosis of a UTI.  Having a bowel movement but did not change this discomfort.  Her CT showed the left cystic ovarian mass which could be an etiology of the discomfort and will be followed up as an outpatient.  It also showed a left hydronephrosis which would not cause pain in the urinary retention which is been treated.  She has no skin rash to explain the pain.  She did have a stone burden on the CT scan but she urology thought was contributing to the urinary retention.  This is been aggressively treated but having a bowel movement did not change her pain.  She will be discharged on a bowel regimen.  She is on oxycodone as an outpatient but she has not gotten any here in the hospital and we will recommend not continuing this as an outpatient.  She is only gotten a stable 50 mg dose of tramadol on the seventh and a single dose on the eighth.  Decreasing her opioid usage will also help to prevent further constipation.  Her last dose of antibiotics for her UTI will be due tomorrow and she can be discharged to SNF tomorrow.  She will need follow-up with urology, PCP, and gynecology.  Bartholomew Crews, MD 02/07/2018, 2:52 PM

## 2018-02-08 DIAGNOSIS — R32 Unspecified urinary incontinence: Secondary | ICD-10-CM

## 2018-02-08 DIAGNOSIS — R319 Hematuria, unspecified: Secondary | ICD-10-CM

## 2018-02-08 LAB — GLUCOSE, CAPILLARY
GLUCOSE-CAPILLARY: 133 mg/dL — AB (ref 70–99)
GLUCOSE-CAPILLARY: 178 mg/dL — AB (ref 70–99)
Glucose-Capillary: 123 mg/dL — ABNORMAL HIGH (ref 70–99)
Glucose-Capillary: 173 mg/dL — ABNORMAL HIGH (ref 70–99)

## 2018-02-08 LAB — BASIC METABOLIC PANEL
ANION GAP: 9 (ref 5–15)
BUN: 20 mg/dL (ref 8–23)
CHLORIDE: 113 mmol/L — AB (ref 98–111)
CO2: 21 mmol/L — AB (ref 22–32)
Calcium: 8.4 mg/dL — ABNORMAL LOW (ref 8.9–10.3)
Creatinine, Ser: 0.99 mg/dL (ref 0.44–1.00)
GFR calc Af Amer: >60 mL/min (ref >60)
GFR calc non Af Amer: 55 mL/min — ABNORMAL LOW (ref >60)
Glucose, Bld: 133 mg/dL — ABNORMAL HIGH (ref 70–99)
POTASSIUM: 3.9 mmol/L (ref 3.5–5.1)
Sodium: 143 mmol/L (ref 135–145)

## 2018-02-08 NOTE — Progress Notes (Signed)
Pharmacy Antibiotic Note  Sarah Colon is a 72 y.o. female admitted on 02/02/2018 with reported ESBL UTI currently being treated at nursing facility [facility]. Today is Day #7 of abx for ESBL UTI. Afebrile, WBC down to 12.2. SCr back to nl, est Crcl 43 ml/min.  Colbert Ewing 9/4 >> 9/5 Merrem 9/5 >>  9/5 UCx - 50K ESBL Kleb pnuemo  Plan: Continue Merrem 1gm IV Q12H Monitor renal fxn, abx LOT - MD note states plan to d/c today so will follow for d/c   Height: 5' (152.4 cm) Weight: 154 lb 12.2 oz (70.2 kg) IBW/kg (Calculated) : 45.5  Temp (24hrs), Avg:98.5 F (36.9 C), Min:98.3 F (36.8 C), Max:98.7 F (37.1 C)  Recent Labs  Lab 02/02/18 1000 02/03/18 0450 02/04/18 0553 02/05/18 0536 02/06/18 1220 02/07/18 0611 02/08/18 0633  WBC 11.0* 10.3 14.3* 15.5* 12.2*  --   --   CREATININE 1.49* 0.94 0.94 1.27* 0.99 0.89 0.99    Estimated Creatinine Clearance: 43.6 mL/min (by C-G formula based on SCr of 0.99 mg/dL).    Allergies  Allergen Reactions  . Codeine Other (See Comments)    Causes shaking      Thuy D. Mina Marble, PharmD, BCPS, [place] 02/08/2018, 9:01 AM

## 2018-02-08 NOTE — Progress Notes (Signed)
  Date: 02/08/2018  Patient name: Sarah Colon  Medical record number: [id number]  Date of birth: 01-Oct-1945   I have seen and evaluated this patient and I have discussed the plan of care with the house staff. Please see their note for complete details. I concur with their findings.  Bartholomew Crews, MD 02/08/2018, 3:43 PM

## 2018-02-08 NOTE — Progress Notes (Signed)
   Subjective:   Overnight: No acute events   Today, Sarah Colon reports that she feels much better and is in a good mood. She was able eat her breakfast without feeling nauseas. She also denies fevers or chills. She is voiding with no difficulties. She has been able to get out of bed to sit in chair but has not been able to ambulate due to pain in the right leg.    Objective:  Vital signs in last 24 hours: Vitals:   02/07/18 0528 02/07/18 1503 02/07/18 1948 02/08/18 0421  BP: (!) 141/66 (!) 153/84 119/62 (!) 145/71  Pulse: 89 (!) 102 96 87  Resp: 18 18 20 20   Temp: 98.4 F (36.9 C) 98.3 F (36.8 C) 98.7 F (37.1 C) 98.6 F (37 C)  TempSrc: Axillary Axillary Oral Oral  SpO2: 100% 100% 100% 100%  Weight:      Height:       Const: In NAD, lying comfortably in bed CV: Irregular, normal S1 and S2. No MGR Resp: CTABL, no wheezes, crackles, rhonchi  Abd: BS+, suprapubic tenderness on palpation-unchanged  Assessment/Plan:  Principal Problem:   Abdominal mass Active Problems:   Chronic right shoulder pain   Abnormal abdominal CT scan   Acute lower UTI   Hydronephrosis of left kidney   Ovarian mass, left  Sarah Colon is a 72 year old pleasant African American female with medical history significant for HFpEF EF 60-65% 2016, Hypertension, Type 2 diabetes Mellitus and Recurrent UTIs who presented to the with lower abdomen and back pain.She was noted to haveleft cystic ovary with hydrosalpinx,left hydronephrosis andurinary retentions/p foley catheter placement(9/6-9/9)  Urinary retention- resolved Left Hydronephrosis Unable to obtain PVR as she has chronic urinary incontinence. Plan is to obtain a bladder scan today to get a general sense of bladder volume. Overall, she has been stable and has bad good outcome post foley catheter removal. -Continue Tamsulosin   Urinary Tract Infection:-Continue Meropenem(Day7) --> discontinue today  Hematuria: Was found to have  hematuria in purewicc container today however urine was clear yesterday. This has previously happed during her hospital stay but spontaneously resolved.  -Needs follow up with PCP to obtain a UA 1-2 weeks after discharge.  Atrial Fibrillation: Irregular. Continue home Xarelto 20mg  daily  Hypertension:Continuediltiazem 120mg  daily  Diabetes Mellitus:ContinueSSI TID wc  Dispo: Anticipated discharge pending SNF placement.   Jean Rosenthal, MD 02/08/2018, 11:16 AM Pager: [phone] IMTS PGY-1

## 2018-02-08 NOTE — Social Work (Signed)
CSW aware pt will receive final dose of iv antibiotics tonight at 11 and then will discharge to SNF at [facility] tomorrow.   Alexander Mt, [place] Work [phone]

## 2018-02-09 DIAGNOSIS — D72829 Elevated white blood cell count, unspecified: Secondary | ICD-10-CM

## 2018-02-09 DIAGNOSIS — Z794 Long term (current) use of insulin: Secondary | ICD-10-CM

## 2018-02-09 DIAGNOSIS — Z885 Allergy status to narcotic agent status: Secondary | ICD-10-CM

## 2018-02-09 DIAGNOSIS — K5641 Fecal impaction: Secondary | ICD-10-CM

## 2018-02-09 DIAGNOSIS — R159 Full incontinence of feces: Secondary | ICD-10-CM

## 2018-02-09 LAB — GLUCOSE, CAPILLARY
GLUCOSE-CAPILLARY: 169 mg/dL — AB (ref 70–99)
Glucose-Capillary: 112 mg/dL — ABNORMAL HIGH (ref 70–99)

## 2018-02-09 MED ORDER — POLYETHYLENE GLYCOL 3350 17 G PO PACK: 17.0000 g | PACK | Freq: Every day | ORAL | 0 refills | Status: DC

## 2018-02-09 MED ORDER — TRAMADOL HCL 50 MG PO TABS: 50.0000 mg | ORAL_TABLET | Freq: Four times a day (QID) | ORAL | 0 refills | Status: DC | PRN

## 2018-02-09 MED ORDER — TAMSULOSIN HCL 0.4 MG PO CAPS: 0.4000 mg | ORAL_CAPSULE | Freq: Every day | ORAL | 3 refills | Status: AC

## 2018-02-09 NOTE — Progress Notes (Signed)
  Date: 02/09/2018  Patient name: Sarah Colon  Medical record number: [id number]  Date of birth: Apr 09, 1946   I have seen and evaluated this patient and I have discussed the plan of care with the house staff. Please see their note for complete details. I concur with their findings with the following additions/corrections: Ms Rickel was seen on morning rounds with the team.  She continues to look better and better each day and more peppy.  She has completed her IV antibiotics for UTI.  She continues to have gross hematuria and will need follow-up with both urology and gynecology as she also has a complex L ovarian mass.  Stable for discharge to SNF.  Bartholomew Crews, MD 02/09/2018, 4:45 PM

## 2018-02-09 NOTE — Social Work (Addendum)
CSW received message from pt's daughter Britt Boozer requesting the doctor call her "its urgent". Dr. Avon Gully paged and he states he will call pt's daughter back. [place], [place] Work [phone]

## 2018-02-09 NOTE — Progress Notes (Signed)
Physical Therapy Treatment Patient Details Name: Sarah Colon MRN: [id number] DOB: 03-23-1946 Today's Date: 02/09/2018    History of Present Illness Pt is a 72 y.o. admitted from nursing home on 02/02/18 with c/o chronic R shoulder pain, suprapubic pain and generalized weakness. CT showed masslike dilation in ureter suspicious for ovarian mass. Regarding R shoulder pain, presumably after removal of a right sided PICC line in 05/2017 and has received subacromial injections in the past. PMH includes CHF, CKD, DM, chronic pain, arthritis.     PT Comments    Pt performed several bouts of rolling, supine<>sit, and sit to stand transfers.  Sarah Colon stedy used for transfers but patient remains to lack strength and ability to achiever full standing despite max +2 assistance.  Plan to return to SNF this pm.  Pt continues to benefit from placement to improve strength and function post hospitalization.    Follow Up Recommendations  SNF;Supervision for mobility/OOB     Equipment Recommendations  None recommended by PT    Recommendations for Other Services       Precautions / Restrictions Precautions Precautions: Fall Restrictions Weight Bearing Restrictions: No    Mobility  Bed Mobility Overal bed mobility: Needs Assistance Bed Mobility: Rolling;Sidelying to Sit;Sit to Supine Rolling: Mod assist Sidelying to sit: Mod assist   Sit to supine: Max assist;+2 for physical assistance   General bed mobility comments: Pt incontinent of stool and urine on arrival and required multiple reps of rolling to R and L to clean patient of urinary and stool incontinence.  Pt post cleanup in R sidelying performed transfer into seated position with moderate assistance for LE advancement and trunk elevation.  To return to supine patient required cues for technique and assist to lower trunk to bed and lift B LEs against grvity to achieve supine position.  Post transfer back to bed patient required more reps of  rolling to donn brief for transfer to SNF later this pm.  Pt able to use UE to reach for railing to assist with rolling to R and L.    Transfers Overall transfer level: Needs assistance Equipment used: (sara stedy) Transfers: Sit to/from Stand Sit to Stand: Max assist;+2 physical assistance         General transfer comment: Pt remains with decreased strength during sit to stand transfers.  She is only able to achieve 3/4 of the way into standing in sara stedy frame.  Pt standing tolerance is limited and she quickly returns to seated position.  Pt sat in [place] stedy while bed pad was changed and returned back to bed as she is awaiting transport to SNF.  Cues provided for hand placement, weight shifting forward, and trunk/hip extension into standing.    Ambulation/Gait                 Stairs             Wheelchair Mobility    Modified Rankin (Stroke Patients Only)       Balance Overall balance assessment: Needs assistance   Sitting balance-Leahy Scale: Poor       Standing balance-Leahy Scale: Fair                              Cognition Arousal/Alertness: Awake/alert Behavior During Therapy: WFL for tasks assessed/performed Overall Cognitive Status: Within Functional Limits for tasks assessed  Exercises      General Comments        Pertinent Vitals/Pain Pain Assessment: 0-10 Pain Score: 3  Pain Location: Abdomen, groin Pain Descriptors / Indicators: Constant;Discomfort;Grimacing;Guarding;Aching Pain Intervention(s): Monitored during session;Repositioned    Home Living                      Prior Function            PT Goals (current goals can now be found in the care plan section) Acute Rehab PT Goals Patient Stated Goal: No more shoulder pain Potential to Achieve Goals: Fair Additional Goals Additional Goal #1: Pt will propel manual w/c 55' with bilateral UEs and  modA. Progress towards PT goals: Progressing toward goals    Frequency    Min 2X/week      PT Plan Current plan remains appropriate    Co-evaluation              AM-PAC PT "6 Clicks" Daily Activity  Outcome Measure  Difficulty turning over in bed (including adjusting bedclothes, sheets and blankets)?: Unable Difficulty moving from lying on back to sitting on the side of the bed? : Unable Difficulty sitting down on and standing up from a chair with arms (e.g., wheelchair, bedside commode, etc,.)?: Unable Help needed moving to and from a bed to chair (including a wheelchair)?: Total Help needed walking in hospital room?: Total Help needed climbing 3-5 steps with a railing? : Total 6 Click Score: 6    End of Session Equipment Utilized During Treatment: Gait belt Activity Tolerance: Patient limited by fatigue;Patient limited by pain Patient left: in bed;with call bell/phone within reach Nurse Communication: Mobility status PT Visit Diagnosis: Other abnormalities of gait and mobility (R26.89);Muscle weakness (generalized) (M62.81)     Time: 1245-1316 PT Time Calculation (min) (ACUTE ONLY): 31 min  Charges:  $Therapeutic Activity: 23-37 mins                     Governor Rooks, PTA Acute Rehabilitation Services Pager [phone] Office [phone]     Adaeze Better Eli Hose 02/09/2018, 2:21 PM

## 2018-02-09 NOTE — Clinical Social Work Placement (Signed)
   CLINICAL SOCIAL WORK PLACEMENT  NOTE [facility] [place]  Date:  02/09/2018  Patient Details  Name: Sarah Colon MRN: [id number] Date of Birth: 1946/01/03  Clinical Social Work is seeking post-discharge placement for this patient at the [place] level of care (*CSW will initial, date and re-position this form in  chart as items are completed):  Yes   Patient/family provided with [place] Work Department's list of facilities offering this level of care within the geographic area requested by the patient (or if unable, by the patient's family).  Yes   Patient/family informed of their freedom to choose among providers that offer the needed level of care, that participate in Medicare, Medicaid or managed care program needed by the patient, have an available bed and are willing to accept the patient.  Yes   Patient/family informed of [facility]'s ownership interest in [facility] and [facility], as well as of the fact that they are under no obligation to receive care at these facilities.  PASRR submitted to EDS on       PASRR number received on       Existing PASRR number confirmed on 02/02/18     FL2 transmitted to all facilities in geographic area requested by pt/family on 02/02/18     FL2 transmitted to all facilities within larger geographic area on       Patient informed that his/her managed care company has contracts with or will negotiate with certain facilities, including the following:        Yes   Patient/family informed of bed offers received.  Patient chooses bed at [facility]     Physician recommends and patient chooses bed at      Patient to be transferred to [place] on 02/09/18.  Patient to be transferred to facility by [facility]     Patient family notified on 02/09/18 of transfer.  Name of family member notified:  daughter Britt Boozer via telephone     PHYSICIAN Please prepare priority discharge summary, including  medications, Please prepare prescriptions     Additional Comment:    _______________________________________________ Alexander Mt, LCSWA 02/09/2018, 11:45 AM

## 2018-02-09 NOTE — Social Work (Addendum)
Clinical Social Worker facilitated patient discharge including contacting patient family and facility to confirm patient discharge plans.  Clinical information faxed to facility and family agreeable with plan.  CSW arranged ambulance transport via [facility] to [place]. RN to call [phone] with report  prior to discharge.  Ambulance transfer requested at 12:20pm for 1:00pm.   Clinical Social Worker will sign off for now as social work intervention is no longer needed. Please consult [place] again if new need arises.  Alexander Mt, [place] Social Worker [phone]

## 2018-02-09 NOTE — Progress Notes (Signed)
   Subjective:   Ms. Hauser reports that she is feeling good this morning. Able to tolerate breakfast  States was feeling hot around 3am but unsure if she was having fever or if the room was just kept warm. She is also endorsing pain in her right leg and difficulty with getting out of bed because of the pain but relieved by heating pad. States significant improvement in abdominal pain but states 'it feels like a baby in there.' Had a 'pretty good' bowel movement yesterday. Endorsing some fatigue. Endorsing mild nausea but no vomiting. Explained to pt about discharge to [place] today and that she has finished her course of antibiotics. Also explained to pt about possibility of oxybutinin and oxycontin contributing to bladder dysfunction. Patient expressed understanding.  Objective:  Vital signs in last 24 hours: Vitals:   02/08/18 2113 02/09/18 0500 02/09/18 0631 02/09/18 0841  BP: (!) 145/68  (!) 143/75 (!) 156/94  Pulse: 88  92 89  Resp: 17  19 20   Temp: 99.4 F (37.4 C)  98.3 F (36.8 C) 98.6 F (37 C)  TempSrc: Oral  Oral Oral  SpO2: 99%  100% 100%  Weight:  70.6 kg  70.6 kg  Height:    5' (1.524 m)   Const: in NAD, lying comfortably in bed  CV: Irregular, no MGR Resp: CTABL Abd: BS+, non-distended, NTTP with stethoscope  Extremity: 1+ RLE edema, trace edema at LLE  Bladder Scans:   Media Information     Assessment/Plan:  Principal Problem:   Abdominal mass Active Problems:   Chronic right shoulder pain   Abnormal abdominal CT scan   Acute lower UTI   Hydronephrosis of left kidney   Ovarian mass, left  Ms. Baskette is a 72 year old pleasant African American female with medical history significant for HFpEF EF 60-65% 2016, Hypertension, Type 2 diabetes Mellitus and Recurrent UTIs who presented to the with lower abdomen and back pain.She was noted to haveleft cystic ovary with hydrosalpinx,left hydronephrosis andurinary retentions/p foley catheter  placement(9/6-9/9)  Urinary retention- resolved Left Hydronephrosis Lower abdominal pain much improved this morning and unable to elicit tenderness on palpation with stethoscope. Bladder scans revealed 119mL and 26mL of urine in bladder. Unable to obtain PVR as patient is incontinent of urine. I will recommend daily bladder ultrasound at her SNF facility to monitor at least until she follows up with Urology.  -Discharge on Tamsulosin  -Urology follow up on 03/13/2018 -Gynecology referral placed  Urinary Tract Infection:Completed 7 day course of Meropenemon 02/08/2018  Hematuria: Hematuria still persistent. I am under the impression this is most likely due to ureter burden from Left ovarian cyst as she has received full course tx of UTI. She had transient resolution on Monday but reoccurred. Her  CT abdomen did not reveal nephrolithiasis and also no evidence of that she is passing blood clots. -Needs follow up with PCP to obtain a UA 1-2 weeks after discharge. -Close monitoring after evaluation by genecology.   Atrial Fibrillation: Irregular. Discharge on home Xarelto 20mg  daily  Hypertension:Discharge on diltiazem 120mg  daily  Diabetes Mellitus:Discharge on home insulin  Dispo: Anticipated dischargepending SNF placement.  Dr. Debroah Loop Teaching Service

## 2018-02-14 ENCOUNTER — Other Ambulatory Visit: Payer: Self-pay | Admitting: Internal Medicine

## 2018-02-14 DIAGNOSIS — N838 Other noninflammatory disorders of ovary, fallopian tube and broad ligament: Secondary | ICD-10-CM

## 2018-03-01 ENCOUNTER — Ambulatory Visit: Payer: Medicare Other | Admitting: Obstetrics & Gynecology

## 2018-03-03 ENCOUNTER — Telehealth: Payer: Self-pay | Admitting: *Deleted

## 2018-03-03 NOTE — Telephone Encounter (Signed)
SPOKE WITH DAUGHTER / MOM IS IN A REHAB CENTER. CENTER WAS TO BE TAKING HER MOTHER TO HER GYN APPOINTMENT 10-2-019 @1 :45/ THEY DID NOT TAKE HE TO THE APPOINTMENT. OFFICE NUMBER GIVEN TO DAUGHTER TO CALL MAKE NEW APPOINTMENT.

## 2018-03-23 ENCOUNTER — Encounter (HOSPITAL_COMMUNITY): Payer: Self-pay

## 2018-04-03 ENCOUNTER — Encounter: Payer: Self-pay | Admitting: Obstetrics and Gynecology

## 2018-04-03 ENCOUNTER — Ambulatory Visit (INDEPENDENT_AMBULATORY_CARE_PROVIDER_SITE_OTHER): Payer: Medicare Other | Admitting: Obstetrics and Gynecology

## 2018-04-03 VITALS — BP 116/76 | HR 87 | Wt 151.0 lb

## 2018-04-03 DIAGNOSIS — N838 Other noninflammatory disorders of ovary, fallopian tube and broad ligament: Secondary | ICD-10-CM

## 2018-04-03 DIAGNOSIS — Z1239 Encounter for other screening for malignant neoplasm of breast: Secondary | ICD-10-CM
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
## 2018-04-07 ENCOUNTER — Telehealth: Payer: Self-pay

## 2018-04-07 NOTE — Telephone Encounter (Signed)
Received VM from [place] [facility] unit manager of nursing home and she needed to make pt an appt, verbalized pt needs appt with Gyn oncologist.  I let unit manager know that appt had already been scheduled for pt for next Tuesday 11/12 at 10:45 arrive 10:15 am - directions given, they will transport her here from nsg home. No other needs per her at this time.

## 2018-04-11 ENCOUNTER — Encounter: Payer: Self-pay | Admitting: Gynecologic Oncology

## 2018-04-11 ENCOUNTER — Telehealth: Payer: Self-pay | Admitting: *Deleted

## 2018-04-11 ENCOUNTER — Encounter: Payer: Self-pay | Admitting: Gynecology

## 2018-04-11 ENCOUNTER — Inpatient Hospital Stay: Payer: Medicare Other | Attending: Gynecology | Admitting: Gynecology

## 2018-04-11 ENCOUNTER — Telehealth: Payer: Self-pay | Admitting: Oncology

## 2018-04-11 VITALS — BP 138/80 | HR 97 | Temp 98.7°F | Ht 60.0 in

## 2018-04-11 DIAGNOSIS — N838 Other noninflammatory disorders of ovary, fallopian tube and broad ligament: Secondary | ICD-10-CM

## 2018-04-11 DIAGNOSIS — R591 Generalized enlarged lymph nodes: Secondary | ICD-10-CM

## 2018-04-11 DIAGNOSIS — D3912 Neoplasm of uncertain behavior of left ovary: Secondary | ICD-10-CM | POA: Diagnosis present

## 2018-04-11 DIAGNOSIS — R599 Enlarged lymph nodes, unspecified: Secondary | ICD-10-CM | POA: Diagnosis not present

## 2018-04-11 DIAGNOSIS — Z87891 Personal history of nicotine dependence: Secondary | ICD-10-CM

## 2018-04-11 DIAGNOSIS — G8191 Hemiplegia, unspecified affecting right dominant side: Secondary | ICD-10-CM | POA: Diagnosis not present

## 2018-04-11 NOTE — Telephone Encounter (Signed)
[facility] called and said patient has already been seen on 03/15/18 and has a follow up appointment on 04/12/18.

## 2018-04-11 NOTE — Addendum Note (Signed)
Addended by: Baruch Merl on: 04/11/2018 06:18 PM   Modules accepted: Orders

## 2018-04-11 NOTE — Telephone Encounter (Signed)
Called patient's daughter to let her know that her mother was seen in our office today, we were unable to do a pelvic exam due to patient being unable to get up from the wheelchair.  I told daughter per Joylene John, NP that our office will arrange for a biopsy of one of the enlarged lymph nodes seen on CT scan and also arrange for the patient to meet with urologist to evaluate the thickening noted in the bladder wall and the kidney blockage on the left, patient also has thick lining of the uterus that we may need to biopsy.  Patient has a cyst on the left ovary that could be blocking the left kidney. Our office would like to get these test done to rule out cancer.  Patient's daughter verbalized understanding.

## 2018-04-11 NOTE — Telephone Encounter (Signed)
Called patients daughter.

## 2018-04-11 NOTE — Patient Instructions (Signed)
You will be contacted by the hospital to arrange for a biopsy of one of the enlarged lymph nodes seen on CT scan.  We will also arrange for you to meet with a urologist to evaluate the thickening noted in the bladder wall and the kidney blockage on the left.    Once this has taken place, we will follow up and discuss recommendations. Please call the office for any questions or concerns.

## 2018-04-11 NOTE — Progress Notes (Signed)
Spoke with Dr. Jacqualyn Posey with IR about requested biopsy of the lymph nodes noted on CT imaging per Dr. C-P.  He states the lymph nodes are not overly enlarged and they are not targetable.  Dr. C-P notified. Plan to proceed with Urology evaluation with the plan for a possible joint procedure if indicated to perform a D&C to evaluate the thickened uterine lining.

## 2018-04-11 NOTE — Telephone Encounter (Signed)
Called patient's daughter back to let her know that  Per Joylene John, NP, that interventional radiology looked at her mother's scans but they don't feel like they are large enough to get a proper biopsy so we will just continue with get patient to the urologist and maybe arranging a joint procedure if necessary between urology and our office.  Unable to reach patient's daughter to leave this information, no voicemail available. Will attempt to call daughter back at a later date.
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
## 2018-04-19 ENCOUNTER — Telehealth: Payer: Self-pay | Admitting: Oncology

## 2018-04-19 NOTE — Telephone Encounter (Signed)
Left a message for Dr. Jackson Latino nurse, Elmyra Ricks regarding thickened bladder wall and possible joint procedure.  Requested a return call.

## 2018-04-25 ENCOUNTER — Telehealth: Payer: Self-pay | Admitting: *Deleted

## 2018-04-25 ENCOUNTER — Telehealth: Payer: Self-pay | Admitting: Oncology

## 2018-04-25 NOTE — Telephone Encounter (Signed)
Per Request from [place] NP at the nursing home, I have faxed the last office note to her

## 2018-04-25 NOTE — Telephone Encounter (Signed)
[facility] and spoke to [name], Dr. Jackson Latino nurse regarding possible joint procedure.  She said that she had put in a request to the surgery scheduler and Dr. Lovena Neighbours.  She said she would make it high priority.  Also left a message for the [facility], Surgery Scheduler to call Sarah John, NP to arrange for the procedure.

## 2018-05-09 ENCOUNTER — Telehealth: Payer: Self-pay | Admitting: Oncology

## 2018-05-09 NOTE — Telephone Encounter (Signed)
Called Dr. Jackson Latino nurse, Elmyra Ricks, and asked what Dr. Jackson Latino recommendations are for patient's bladder thickening.  She is going to send him another message.

## 2018-05-10 NOTE — Telephone Encounter (Signed)
Elmyra Ricks from Dr. Jackson Latino office called and said Dr. Lovena Neighbours does want to do surgery.  He is putting in a posting and we should receive a call from the scheduler, Beola Cord.  If we don't hear from her, her extension is [phone].

## 2018-05-11 ENCOUNTER — Telehealth: Payer: Self-pay | Admitting: Oncology

## 2018-05-11 NOTE — Telephone Encounter (Signed)
[facility] at [phone] and spoke to patient's nurse Maudie Mercury.  Advised her that cardiac clearance is needed for surgery (cystoscopy with ureteral stents and D&C) and also recommendations for holding Xarelto before surgery.  She said Dr. Keenan Bachelor is in on Mondays, Wednesdays and Fridays and she will let her know what is needed.  Also faxed clearance form to [phone].  Maudie Mercury said she will call us back on Monday after she discusses this with Dr. Keenan Bachelor.

## 2018-05-16 NOTE — Telephone Encounter (Signed)
Spoke with Maudie Mercury. She stated that the Dr. Keenan Bachelor and the Nurse Practitioner  Saw Ms Zavada yesterday.  They are awaiting lab work to make decision for cardiac clearance.  They will be in the clinic tomorrow.  Kim said to call tomorrow afternoon about 1 pm to follow up with her.

## 2018-05-17 NOTE — Telephone Encounter (Signed)
Spoke with Sarah Colon and she stated that Ms Bozzi's EKG was abnormal.   It showed a possible LBBB. The physician and nurse practitioner have to compare EKG to  previous tracings.  If the LBBB is new , she will need a referral to cardiac care with [facility] to be evaluated prior to clearance given for surgical procedures. If the EKG is the same as previous EKGs, then a clearance could be given for procedures. Sarah Colon said to call this Friday or Monday 05-22-18 for update.

## 2018-05-26 ENCOUNTER — Telehealth: Payer: Self-pay | Admitting: Oncology

## 2018-05-26 NOTE — Telephone Encounter (Signed)
Called Kim with [facility] to check on cardiology clearance.  She said Denina' last EKG was abnormal so she does need a cardiology consult.  She is going to send in the referral today to [facility].  She said to call back on Tuesday to check the status of the appointment with her.

## 2018-05-28 ENCOUNTER — Emergency Department (HOSPITAL_COMMUNITY): Payer: Medicare Other

## 2018-05-28 ENCOUNTER — Inpatient Hospital Stay (HOSPITAL_COMMUNITY)
Admission: EM | Admit: 2018-05-28 | Discharge: 2018-06-03 | DRG: 871 | Disposition: A | Discharge status: Skilled Nursing Facility | Payer: Medicare Other | Source: Skilled Nursing Facility | Attending: Internal Medicine | Admitting: Internal Medicine

## 2018-05-28 ENCOUNTER — Encounter (HOSPITAL_COMMUNITY): Payer: Self-pay | Admitting: Emergency Medicine

## 2018-05-28 ENCOUNTER — Inpatient Hospital Stay (HOSPITAL_COMMUNITY): Payer: Medicare Other

## 2018-05-28 DIAGNOSIS — N183 Chronic kidney disease, stage 3 (moderate): Secondary | ICD-10-CM | POA: Diagnosis present

## 2018-05-28 DIAGNOSIS — L89152 Pressure ulcer of sacral region, stage 2: Secondary | ICD-10-CM | POA: Diagnosis not present

## 2018-05-28 DIAGNOSIS — N179 Acute kidney failure, unspecified: Secondary | ICD-10-CM | POA: Diagnosis present

## 2018-05-28 DIAGNOSIS — E1159 Type 2 diabetes mellitus with other circulatory complications: Secondary | ICD-10-CM | POA: Diagnosis not present

## 2018-05-28 DIAGNOSIS — E039 Hypothyroidism, unspecified: Secondary | ICD-10-CM

## 2018-05-28 DIAGNOSIS — I5032 Chronic diastolic (congestive) heart failure: Secondary | ICD-10-CM | POA: Diagnosis present

## 2018-05-28 DIAGNOSIS — E119 Type 2 diabetes mellitus without complications: Secondary | ICD-10-CM

## 2018-05-28 DIAGNOSIS — R32 Unspecified urinary incontinence: Secondary | ICD-10-CM | POA: Diagnosis present

## 2018-05-28 DIAGNOSIS — E876 Hypokalemia: Secondary | ICD-10-CM | POA: Diagnosis present

## 2018-05-28 DIAGNOSIS — Z885 Allergy status to narcotic agent status: Secondary | ICD-10-CM

## 2018-05-28 DIAGNOSIS — Z993 Dependence on wheelchair: Secondary | ICD-10-CM

## 2018-05-28 DIAGNOSIS — R05 Cough: Secondary | ICD-10-CM | POA: Diagnosis not present

## 2018-05-28 DIAGNOSIS — L03116 Cellulitis of left lower limb: Secondary | ICD-10-CM | POA: Diagnosis present

## 2018-05-28 DIAGNOSIS — M19012 Primary osteoarthritis, left shoulder: Secondary | ICD-10-CM | POA: Diagnosis present

## 2018-05-28 DIAGNOSIS — IMO0001 Reserved for inherently not codable concepts without codable children: Secondary | ICD-10-CM

## 2018-05-28 DIAGNOSIS — G8929 Other chronic pain: Secondary | ICD-10-CM | POA: Diagnosis present

## 2018-05-28 DIAGNOSIS — E11610 Type 2 diabetes mellitus with diabetic neuropathic arthropathy: Secondary | ICD-10-CM | POA: Diagnosis present

## 2018-05-28 DIAGNOSIS — I5033 Acute on chronic diastolic (congestive) heart failure: Secondary | ICD-10-CM

## 2018-05-28 DIAGNOSIS — E1122 Type 2 diabetes mellitus with diabetic chronic kidney disease: Secondary | ICD-10-CM | POA: Diagnosis present

## 2018-05-28 DIAGNOSIS — K5900 Constipation, unspecified: Secondary | ICD-10-CM | POA: Diagnosis not present

## 2018-05-28 DIAGNOSIS — Z841 Family history of disorders of kidney and ureter: Secondary | ICD-10-CM

## 2018-05-28 DIAGNOSIS — Z79899 Other long term (current) drug therapy: Secondary | ICD-10-CM

## 2018-05-28 DIAGNOSIS — Z794 Long term (current) use of insulin: Secondary | ICD-10-CM | POA: Diagnosis not present

## 2018-05-28 DIAGNOSIS — Z87891 Personal history of nicotine dependence: Secondary | ICD-10-CM

## 2018-05-28 DIAGNOSIS — N838 Other noninflammatory disorders of ovary, fallopian tube and broad ligament: Secondary | ICD-10-CM | POA: Diagnosis present

## 2018-05-28 DIAGNOSIS — R159 Full incontinence of feces: Secondary | ICD-10-CM | POA: Diagnosis present

## 2018-05-28 DIAGNOSIS — M109 Gout, unspecified: Secondary | ICD-10-CM | POA: Diagnosis present

## 2018-05-28 DIAGNOSIS — R140 Abdominal distension (gaseous): Secondary | ICD-10-CM

## 2018-05-28 DIAGNOSIS — L03317 Cellulitis of buttock: Secondary | ICD-10-CM | POA: Diagnosis present

## 2018-05-28 DIAGNOSIS — E872 Acidosis: Secondary | ICD-10-CM | POA: Diagnosis present

## 2018-05-28 DIAGNOSIS — I480 Paroxysmal atrial fibrillation: Secondary | ICD-10-CM | POA: Diagnosis present

## 2018-05-28 DIAGNOSIS — L89154 Pressure ulcer of sacral region, stage 4: Secondary | ICD-10-CM | POA: Diagnosis present

## 2018-05-28 DIAGNOSIS — L89153 Pressure ulcer of sacral region, stage 3: Secondary | ICD-10-CM | POA: Diagnosis present

## 2018-05-28 DIAGNOSIS — Z6825 Body mass index (BMI) 25.0-25.9, adult: Secondary | ICD-10-CM | POA: Diagnosis not present

## 2018-05-28 DIAGNOSIS — E1142 Type 2 diabetes mellitus with diabetic polyneuropathy: Secondary | ICD-10-CM | POA: Diagnosis present

## 2018-05-28 DIAGNOSIS — I340 Nonrheumatic mitral (valve) insufficiency: Secondary | ICD-10-CM | POA: Diagnosis not present

## 2018-05-28 DIAGNOSIS — I152 Hypertension secondary to endocrine disorders: Secondary | ICD-10-CM | POA: Diagnosis present

## 2018-05-28 DIAGNOSIS — E1169 Type 2 diabetes mellitus with other specified complication: Secondary | ICD-10-CM | POA: Diagnosis present

## 2018-05-28 DIAGNOSIS — Z8049 Family history of malignant neoplasm of other genital organs: Secondary | ICD-10-CM

## 2018-05-28 DIAGNOSIS — R350 Frequency of micturition: Secondary | ICD-10-CM | POA: Diagnosis present

## 2018-05-28 DIAGNOSIS — Z7901 Long term (current) use of anticoagulants: Secondary | ICD-10-CM

## 2018-05-28 DIAGNOSIS — Z79890 Hormone replacement therapy (postmenopausal): Secondary | ICD-10-CM

## 2018-05-28 DIAGNOSIS — I10 Essential (primary) hypertension: Secondary | ICD-10-CM

## 2018-05-28 DIAGNOSIS — Z79891 Long term (current) use of opiate analgesic: Secondary | ICD-10-CM

## 2018-05-28 DIAGNOSIS — A419 Sepsis, unspecified organism: Principal | ICD-10-CM | POA: Diagnosis present

## 2018-05-28 DIAGNOSIS — R627 Adult failure to thrive: Secondary | ICD-10-CM | POA: Diagnosis present

## 2018-05-28 DIAGNOSIS — Z66 Do not resuscitate: Secondary | ICD-10-CM | POA: Diagnosis present

## 2018-05-28 DIAGNOSIS — E861 Hypovolemia: Secondary | ICD-10-CM | POA: Diagnosis present

## 2018-05-28 DIAGNOSIS — H409 Unspecified glaucoma: Secondary | ICD-10-CM | POA: Diagnosis present

## 2018-05-28 DIAGNOSIS — L03115 Cellulitis of right lower limb: Secondary | ICD-10-CM

## 2018-05-28 DIAGNOSIS — M25512 Pain in left shoulder: Secondary | ICD-10-CM

## 2018-05-28 LAB — CBC WITH DIFFERENTIAL/PLATELET
Abs Immature Granulocytes: 0.35 10*3/uL — ABNORMAL HIGH (ref 0.00–0.07)
Basophils Absolute: 0.1 10*3/uL (ref 0.0–0.1)
Basophils Relative: 0 %
Eosinophils Absolute: 0.6 10*3/uL — ABNORMAL HIGH (ref 0.0–0.5)
Eosinophils Relative: 2 %
HCT: 37.8 % (ref 36.0–46.0)
Hemoglobin: 12.6 g/dL (ref 12.0–15.0)
Immature Granulocytes: 1 %
Lymphocytes Relative: 12 %
Lymphs Abs: 3 10*3/uL (ref 0.7–4.0)
MCH: 28.8 pg (ref 26.0–34.0)
MCHC: 33.3 g/dL (ref 30.0–36.0)
MCV: 86.3 fL (ref 80.0–100.0)
Monocytes Absolute: 1.4 10*3/uL — ABNORMAL HIGH (ref 0.1–1.0)
Monocytes Relative: 6 %
Neutro Abs: 19.8 10*3/uL — ABNORMAL HIGH (ref 1.7–7.7)
Neutrophils Relative %: 79 %
Platelets: 399 10*3/uL (ref 150–400)
RBC: 4.38 MIL/uL (ref 3.87–5.11)
RDW: 16.2 % — ABNORMAL HIGH (ref 11.5–15.5)
WBC: 25.2 10*3/uL — ABNORMAL HIGH (ref 4.0–10.5)
nRBC: 0 % (ref 0.0–0.2)

## 2018-05-28 LAB — GLUCOSE, CAPILLARY: Glucose-Capillary: 146 mg/dL — ABNORMAL HIGH (ref 70–99)

## 2018-05-28 LAB — COMPREHENSIVE METABOLIC PANEL
ALT: 58 U/L — ABNORMAL HIGH (ref 0–44)
AST: 93 U/L — ABNORMAL HIGH (ref 15–41)
Albumin: 2.8 g/dL — ABNORMAL LOW (ref 3.5–5.0)
Alkaline Phosphatase: 146 U/L — ABNORMAL HIGH (ref 38–126)
Anion gap: 11 (ref 5–15)
BILIRUBIN TOTAL: 1.2 mg/dL (ref 0.3–1.2)
BUN: 31 mg/dL — ABNORMAL HIGH (ref 8–23)
CO2: 24 mmol/L (ref 22–32)
Calcium: 8.7 mg/dL — ABNORMAL LOW (ref 8.9–10.3)
Chloride: 104 mmol/L (ref 98–111)
Creatinine, Ser: 1.38 mg/dL — ABNORMAL HIGH (ref 0.44–1.00)
GFR calc Af Amer: 44 mL/min — ABNORMAL LOW (ref >60)
GFR calc non Af Amer: 38 mL/min — ABNORMAL LOW (ref >60)
Glucose, Bld: 176 mg/dL — ABNORMAL HIGH (ref 70–99)
Potassium: 3.9 mmol/L (ref 3.5–5.1)
Sodium: 139 mmol/L (ref 135–145)
TOTAL PROTEIN: 6.9 g/dL (ref 6.5–8.1)

## 2018-05-28 LAB — I-STAT CG4 LACTIC ACID, ED: LACTIC ACID, VENOUS: 2.38 mmol/L — AB (ref 0.5–1.9)

## 2018-05-28 MED ORDER — TAMSULOSIN HCL 0.4 MG PO CAPS
0.4000 mg | ORAL_CAPSULE | Freq: Every day | ORAL | Status: DC
  Administered 2018-05-28 – 2018-06-02 (×6): 0.4 mg via ORAL
  Filled 2018-05-28 (×6): qty 1

## 2018-05-28 MED ORDER — LEVOTHYROXINE SODIUM 75 MCG PO TABS
75.0000 ug | ORAL_TABLET | Freq: Every day | ORAL | Status: DC
  Administered 2018-05-29 – 2018-06-03 (×6): 75 ug via ORAL
  Filled 2018-05-28: qty 3
  Filled 2018-05-28 (×4): qty 1
  Filled 2018-05-28 (×2): qty 3
  Filled 2018-05-28: qty 1
  Filled 2018-05-28 (×2): qty 3
  Filled 2018-05-28: qty 1

## 2018-05-28 MED ORDER — TRAMADOL HCL 50 MG PO TABS
50.0000 mg | ORAL_TABLET | Freq: Four times a day (QID) | ORAL | Status: DC | PRN
  Administered 2018-05-30 – 2018-06-03 (×5): 50 mg via ORAL
  Filled 2018-05-28 (×5): qty 1

## 2018-05-28 MED ORDER — INSULIN GLARGINE 100 UNIT/ML ~~LOC~~ SOLN
20.0000 [IU] | Freq: Every day | SUBCUTANEOUS | Status: DC
  Administered 2018-05-28 – 2018-06-02 (×6): 20 [IU] via SUBCUTANEOUS
  Filled 2018-05-28 (×7): qty 0.2

## 2018-05-28 MED ORDER — METOPROLOL TARTRATE 25 MG PO TABS
25.0000 mg | ORAL_TABLET | Freq: Two times a day (BID) | ORAL | Status: DC
  Administered 2018-05-28 – 2018-06-03 (×11): 25 mg via ORAL
  Filled 2018-05-28 (×12): qty 1

## 2018-05-28 MED ORDER — INSULIN ASPART 100 UNIT/ML ~~LOC~~ SOLN
0.0000 [IU] | Freq: Three times a day (TID) | SUBCUTANEOUS | Status: DC
  Administered 2018-05-29 (×2): 1 [IU] via SUBCUTANEOUS
  Administered 2018-05-30: 3 [IU] via SUBCUTANEOUS
  Administered 2018-05-31 (×2): 2 [IU] via SUBCUTANEOUS
  Administered 2018-06-01: 1 [IU] via SUBCUTANEOUS

## 2018-05-28 MED ORDER — MORPHINE SULFATE (PF) 4 MG/ML IV SOLN
4.0000 mg | Freq: Once | INTRAVENOUS | Status: AC
  Administered 2018-05-28: 4 mg via INTRAVENOUS
  Filled 2018-05-28: qty 1

## 2018-05-28 MED ORDER — GABAPENTIN 300 MG PO CAPS
600.0000 mg | ORAL_CAPSULE | Freq: Two times a day (BID) | ORAL | Status: DC
  Administered 2018-05-28 – 2018-06-03 (×11): 600 mg via ORAL
  Filled 2018-05-28 (×12): qty 2

## 2018-05-28 MED ORDER — VANCOMYCIN HCL IN DEXTROSE 1-5 GM/200ML-% IV SOLN
1000.0000 mg | INTRAVENOUS | Status: DC
  Administered 2018-05-30: 1000 mg via INTRAVENOUS
  Filled 2018-05-28: qty 200

## 2018-05-28 MED ORDER — PIPERACILLIN-TAZOBACTAM 3.375 G IVPB 30 MIN: 3.3750 g | Freq: Once | INTRAVENOUS | Status: DC

## 2018-05-28 MED ORDER — IOPAMIDOL (ISOVUE-300) INJECTION 61%
100.0000 mL | Freq: Once | INTRAVENOUS | Status: AC | PRN
  Administered 2018-05-28: 80 mL via INTRAVENOUS

## 2018-05-28 MED ORDER — SODIUM CHLORIDE 0.9 % IV SOLN
INTRAVENOUS | Status: AC
  Administered 2018-05-28: 22:00:00 via INTRAVENOUS

## 2018-05-28 MED ORDER — VANCOMYCIN HCL IN DEXTROSE 1-5 GM/200ML-% IV SOLN: 1000.0000 mg | Freq: Once | INTRAVENOUS | Status: DC

## 2018-05-28 MED ORDER — VANCOMYCIN HCL IN DEXTROSE 1-5 GM/200ML-% IV SOLN
1000.0000 mg | Freq: Once | INTRAVENOUS | Status: AC
  Administered 2018-05-28: 1000 mg via INTRAVENOUS
  Filled 2018-05-28: qty 200

## 2018-05-28 MED ORDER — ONDANSETRON HCL 4 MG PO TABS: 4.0000 mg | ORAL_TABLET | Freq: Four times a day (QID) | ORAL | Status: DC | PRN

## 2018-05-28 MED ORDER — RIVAROXABAN 20 MG PO TABS
20.0000 mg | ORAL_TABLET | Freq: Every day | ORAL | Status: DC
  Administered 2018-05-29: 20 mg via ORAL
  Filled 2018-05-28: qty 1

## 2018-05-28 MED ORDER — ACETAMINOPHEN 325 MG PO TABS
650.0000 mg | ORAL_TABLET | Freq: Four times a day (QID) | ORAL | Status: DC | PRN
  Administered 2018-05-28 – 2018-06-02 (×2): 650 mg via ORAL
  Filled 2018-05-28 (×3): qty 2

## 2018-05-28 MED ORDER — PIPERACILLIN-TAZOBACTAM 3.375 G IVPB 30 MIN
3.3750 g | Freq: Once | INTRAVENOUS | Status: AC
  Administered 2018-05-28: 3.375 g via INTRAVENOUS
  Filled 2018-05-28: qty 50

## 2018-05-28 MED ORDER — PANTOPRAZOLE SODIUM 40 MG PO TBEC
40.0000 mg | DELAYED_RELEASE_TABLET | Freq: Every day | ORAL | Status: DC
  Administered 2018-05-29 – 2018-06-03 (×5): 40 mg via ORAL
  Filled 2018-05-28 (×6): qty 1

## 2018-05-28 MED ORDER — DILTIAZEM HCL 90 MG PO TABS
180.0000 mg | ORAL_TABLET | Freq: Every day | ORAL | Status: DC
  Administered 2018-05-29: 180 mg via ORAL
  Filled 2018-05-28: qty 2

## 2018-05-28 MED ORDER — ONDANSETRON HCL 4 MG/2ML IJ SOLN: 4.0000 mg | Freq: Four times a day (QID) | INTRAMUSCULAR | Status: DC | PRN

## 2018-05-28 MED ORDER — PIPERACILLIN-TAZOBACTAM 3.375 G IVPB
3.3750 g | Freq: Three times a day (TID) | INTRAVENOUS | Status: DC
  Administered 2018-05-28 – 2018-05-31 (×8): 3.375 g via INTRAVENOUS
  Filled 2018-05-28 (×9): qty 50

## 2018-05-28 MED ORDER — ACETAMINOPHEN 650 MG RE SUPP: 650.0000 mg | Freq: Four times a day (QID) | RECTAL | Status: DC | PRN

## 2018-05-28 MED ORDER — SODIUM CHLORIDE (PF) 0.9 % IJ SOLN
INTRAMUSCULAR | Status: AC
  Filled 2018-05-28: qty 50

## 2018-05-28 MED ORDER — SENNOSIDES-DOCUSATE SODIUM 8.6-50 MG PO TABS
1.0000 | ORAL_TABLET | Freq: Two times a day (BID) | ORAL | Status: DC
  Administered 2018-05-28 – 2018-06-01 (×4): 1 via ORAL
  Filled 2018-05-28 (×9): qty 1

## 2018-05-28 MED ORDER — INSULIN GLARGINE 100 UNIT/ML ~~LOC~~ SOLN: 20.0000 [IU] | Freq: Every day | SUBCUTANEOUS | Status: DC

## 2018-05-28 MED ORDER — IOPAMIDOL (ISOVUE-300) INJECTION 61%
INTRAVENOUS | Status: AC
  Filled 2018-05-28: qty 100

## 2018-05-28 MED ORDER — POLYETHYLENE GLYCOL 3350 17 G PO PACK
17.0000 g | PACK | Freq: Two times a day (BID) | ORAL | Status: DC
  Administered 2018-05-28 – 2018-05-31 (×4): 17 g via ORAL
  Filled 2018-05-28 (×8): qty 1
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
## 2018-05-28 NOTE — Progress Notes (Signed)
A consult was received from an ED physician for vancomycin per pharmacy dosing.  The patient's profile has been reviewed for ht/wt/allergies/indication/available labs.   A one time order has been placed for vancomycin 1g IV. (Will follow up for updated weight and give additional load if indicated)   Further antibiotics/pharmacy consults should be ordered by admitting physician if indicated.                       Thank you,  Romeo Rabon, PharmD. Mobile: [phone]. 05/28/2018,3:51 PM.

## 2018-05-28 NOTE — Progress Notes (Signed)
  Pharmacy Antibiotic Note  Sarah Colon is a 72 y.o. female admitted on 05/28/2018 with cough, fever, fatigue, increasing pain, sacral decubitus, and redness of her right buttocks and right thigh. Pharmacy is asked to dose Zosyn and Vancomycin for cellulitis. First doses given in the ED.  Plan: Zosyn 3.375g IV Q8H infused over 4hrs. Vancomycin 1000mg  IV q36h to target AUC 413 (Goal AUC = 400 - 500)  Measure Vanc peak and trough at steady state. Follow up renal function, culture results, and clinical course.   Height: 5\' 4"  (162.6 cm) Weight: 150 lb 6.4 oz (68.2 kg) IBW/kg (Calculated) : 54.7  Temp (24hrs), Avg:98.5 F (36.9 C), Min:98.5 F (36.9 C), Max:98.5 F (36.9 C)  Recent Labs  Lab 05/28/18 1535 05/28/18 1541  WBC 25.2*  --   CREATININE 1.38*  --   LATICACIDVEN  --  2.38*    Estimated Creatinine Clearance: 33.9 mL/min (A) (by C-G formula based on SCr of 1.38 mg/dL (H)).    Allergies  Allergen Reactions  . Codeine Other (See Comments)    Causes shaking    Antimicrobials this admission: Vanc 12/29 >>  Zosyn 12/29 >>   Dose adjustments this admission:  --   Microbiology results: BCx: UCx:  Sputum:  MRSA PCR:  Thank you for allowing pharmacy to be a part of this patient's care.  Romeo Rabon, PharmD. Mobile: [phone]. 05/28/2018,8:19 PM.

## 2018-05-28 NOTE — ED Triage Notes (Signed)
Patient here from [facility] with complaints of cough, fever, fatigue and flu like symptoms since Thursday. States that chest x-ray should possible pneumonia. Diabetic Stage 3 sacral wound.

## 2018-05-29 ENCOUNTER — Other Ambulatory Visit: Payer: Self-pay

## 2018-05-29 ENCOUNTER — Ambulatory Visit: Payer: Medicare Other | Admitting: Cardiovascular Disease

## 2018-05-29 ENCOUNTER — Encounter: Payer: Self-pay | Admitting: Cardiovascular Disease

## 2018-05-29 DIAGNOSIS — L89152 Pressure ulcer of sacral region, stage 2: Secondary | ICD-10-CM

## 2018-05-29 LAB — BLOOD CULTURE ID PANEL (REFLEXED)
ACINETOBACTER BAUMANNII: NOT DETECTED
Candida albicans: NOT DETECTED
Candida glabrata: NOT DETECTED
Candida krusei: NOT DETECTED
Candida parapsilosis: NOT DETECTED
Candida tropicalis: NOT DETECTED
ESCHERICHIA COLI: NOT DETECTED
Enterobacter cloacae complex: NOT DETECTED
Enterobacteriaceae species: NOT DETECTED
Enterococcus species: NOT DETECTED
Haemophilus influenzae: NOT DETECTED
KLEBSIELLA OXYTOCA: NOT DETECTED
Klebsiella pneumoniae: NOT DETECTED
Listeria monocytogenes: NOT DETECTED
Methicillin resistance: DETECTED — AB
Neisseria meningitidis: NOT DETECTED
PSEUDOMONAS AERUGINOSA: NOT DETECTED
Proteus species: NOT DETECTED
Serratia marcescens: NOT DETECTED
Staphylococcus aureus (BCID): NOT DETECTED
Staphylococcus species: DETECTED — AB
Streptococcus agalactiae: NOT DETECTED
Streptococcus pneumoniae: NOT DETECTED
Streptococcus pyogenes: NOT DETECTED
Streptococcus species: NOT DETECTED

## 2018-05-29 LAB — URINALYSIS, ROUTINE W REFLEX MICROSCOPIC
Bilirubin Urine: NEGATIVE
Glucose, UA: NEGATIVE mg/dL
Ketones, ur: NEGATIVE mg/dL
Nitrite: NEGATIVE
PH: 5 (ref 5.0–8.0)
Protein, ur: 100 mg/dL — AB
Specific Gravity, Urine: 1.031 — ABNORMAL HIGH (ref 1.005–1.030)

## 2018-05-29 LAB — MRSA PCR SCREENING: MRSA by PCR: POSITIVE — AB

## 2018-05-29 LAB — GLUCOSE, CAPILLARY
Glucose-Capillary: 77 mg/dL (ref 70–99)
Glucose-Capillary: 142 mg/dL — ABNORMAL HIGH (ref 70–99)
Glucose-Capillary: 126 mg/dL — ABNORMAL HIGH (ref 70–99)
Glucose-Capillary: 126 mg/dL — ABNORMAL HIGH (ref 70–99)

## 2018-05-29 MED ORDER — FUROSEMIDE 10 MG/ML IJ SOLN
40.0000 mg | Freq: Every day | INTRAMUSCULAR | Status: AC
  Administered 2018-05-29 – 2018-05-31 (×3): 40 mg via INTRAVENOUS
  Filled 2018-05-29 (×3): qty 4

## 2018-05-29 MED ORDER — MUPIROCIN 2 % EX OINT
1.0000 "application " | TOPICAL_OINTMENT | Freq: Two times a day (BID) | CUTANEOUS | Status: AC
  Administered 2018-05-29 – 2018-06-02 (×9): 1 via NASAL
  Filled 2018-05-29: qty 22

## 2018-05-29 MED ORDER — CHLORHEXIDINE GLUCONATE CLOTH 2 % EX PADS
6.0000 | MEDICATED_PAD | Freq: Every day | CUTANEOUS | Status: DC
  Administered 2018-05-30 – 2018-06-03 (×4): 6 via TOPICAL

## 2018-05-29 NOTE — Progress Notes (Signed)
PHARMACY - PHYSICIAN COMMUNICATION CRITICAL VALUE ALERT - BLOOD CULTURE IDENTIFICATION (BCID)  Sarah Colon is an 72 y.o. female who presented to [facility] on 05/28/2018 with a chief complaint of cough, fever, fatigue and flu like symptoms since Thursday  Assessment:  Cellulitis R buttocks, MR CoNS could be a contaminant  Name of physician (or Provider) Contacted: F. Xu  Current antibiotics: vanc/zosyn per Rx  Changes to prescribed antibiotics recommended:  Patient is on recommended antibiotics - No changes needed  Results for orders placed or performed during the hospital encounter of 05/28/18  Blood Culture ID Panel (Reflexed) (Collected: 05/28/2018  3:35 PM)  Result Value Ref Range   Enterococcus species NOT DETECTED NOT DETECTED   Listeria monocytogenes NOT DETECTED NOT DETECTED   Staphylococcus species DETECTED (A) NOT DETECTED   Staphylococcus aureus (BCID) NOT DETECTED NOT DETECTED   Methicillin resistance DETECTED (A) NOT DETECTED   Streptococcus species NOT DETECTED NOT DETECTED   Streptococcus agalactiae NOT DETECTED NOT DETECTED   Streptococcus pneumoniae NOT DETECTED NOT DETECTED   Streptococcus pyogenes NOT DETECTED NOT DETECTED   Acinetobacter baumannii NOT DETECTED NOT DETECTED   Enterobacteriaceae species NOT DETECTED NOT DETECTED   Enterobacter cloacae complex NOT DETECTED NOT DETECTED   Escherichia coli NOT DETECTED NOT DETECTED   Klebsiella oxytoca NOT DETECTED NOT DETECTED   Klebsiella pneumoniae NOT DETECTED NOT DETECTED   Proteus species NOT DETECTED NOT DETECTED   Serratia marcescens NOT DETECTED NOT DETECTED   Haemophilus influenzae NOT DETECTED NOT DETECTED   Neisseria meningitidis NOT DETECTED NOT DETECTED   Pseudomonas aeruginosa NOT DETECTED NOT DETECTED   Candida albicans NOT DETECTED NOT DETECTED   Candida glabrata NOT DETECTED NOT DETECTED   Candida krusei NOT DETECTED NOT DETECTED   Candida parapsilosis NOT DETECTED NOT DETECTED   Candida  tropicalis NOT DETECTED NOT DETECTED   Eudelia Bunch, Pharm.D [phone] 05/29/2018 12:55 PM

## 2018-05-29 NOTE — Progress Notes (Signed)
CRITICAL VALUE ALERT  Critical Value:  Positive MRSA  Date & Time Notied:  05/29/18 ,1230  Provider Notified: Florencia Reasons , MD  Orders Received/Actions taken: yes

## 2018-05-29 NOTE — Progress Notes (Signed)
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 -continue broad spectrum abx with vanc/zosyn for now  AKI on CKDII -cr 1.38 on presentation, cr 0.99 at baseline -renal dosing meds  Acute on chronic diastolic chf Bilateral lower extremity pitting edema Will get venous doppler/echocardiogram Start lasix, strict intake and output  PAF, currently sinus rhythm, continue lopressor and  xarelto  HTN: hold cardizem, continue lopressor, start on iv lasix due to edema  Insulin dependent dm2 Hold metformin, continue insulin Check a1c  Hypothyroidism: -Continue home Synthroid  Constipation: Large stool burden noted on CT. -Start scheduled MiraLAX and Senokot  History left ovarian mass: -Previously seen left ovarian cyst not noted on repeat CT pelvis this admission  Left shoulder pain: Shoulder x ray" Severe progressive left glenohumeral joint osteoarthritis. No fracture or malalignment.  FTT; baseline bed to wheelchair bound, from SNF  Code Status: DNR  Family Communication: patient   Disposition Plan: not  ready to discharge   Consultants:  Wound care  Procedures:  none  Antibiotics:  vanc/zosyn   Objective: BP (!) 148/93   Pulse 88   Temp 98.2 F (36.8 C) (Oral)   Resp 16   Ht 5\' 4"  (1.626 m)   Wt 70.7 kg   SpO2 99%   BMI 26.76 kg/m   Intake/Output Summary (Last 24 hours) at 05/29/2018 1645 Last data filed at 05/29/2018 1230 Gross per 24 hour  Intake 1088.26 ml  Output 200 ml  Net 888.26 ml   Filed Weights   05/28/18 1612 05/29/18 0447  Weight: 68.2 kg 70.7 kg    Exam: Patient is examined daily including today on 05/29/2018, exams remain the same as of yesterday except that has changed    General:  Chronically ill appearing, NAD  Cardiovascular: RRR  Respiratory: CTABL  Abdomen: Soft/ND/NT, positive BS  Musculoskeletal: bilateral lower extremity pitting Edema, not able to lift right leg against gravity ( chronic)  Neuro: alert, oriented   Data Reviewed: Basic Metabolic Panel: Recent Labs  Lab 05/28/18 1535  NA 139  K 3.9  CL 104  CO2 24  GLUCOSE 176*  BUN 31*  CREATININE 1.38*  CALCIUM 8.7*   Liver Function Tests: Recent Labs  Lab 05/28/18 1535  AST 93*  ALT 58*  ALKPHOS 146*  BILITOT 1.2  PROT 6.9  ALBUMIN 2.8*   No results for input(s): LIPASE, AMYLASE in the last 168 hours. No results for input(s): AMMONIA in the last 168 hours. CBC: Recent Labs  Lab 05/28/18 1535  WBC 25.2*  NEUTROABS 19.8*  HGB 12.6  HCT 37.8  MCV 86.3  PLT 399   Cardiac Enzymes:  No results for input(s): CKTOTAL, CKMB, CKMBINDEX, TROPONINI in the last 168 hours. BNP (last 3 results) No results for input(s): BNP in the last 8760 hours.  ProBNP (last 3 results) No results for input(s): PROBNP in the last 8760 hours.  CBG: Recent Labs  Lab 05/28/18 2201 05/29/18 0740 05/29/18 1217  GLUCAP 146* 77 142*    Recent Results (from the past 240 hour(s))  Culture, blood (Routine x 2)     Status: None (Preliminary result)   Collection Time:  05/28/18  3:35 PM  Result Value Ref Range Status   Specimen Description   Final    RIGHT ANTECUBITAL Performed at [facility] [address], [place], [place] [address]    Special Requests   Final    BOTTLES DRAWN AEROBIC AND ANAEROBIC Blood Culture adequate volume Performed at [facility] [address], [place], [place] [address]    Culture  Setup Time   Final    GRAM POSITIVE COCCI AEROBIC BOTTLE ONLY Organism ID to follow CRITICAL RESULT CALLED TO, READ BACK BY AND VERIFIED WITH: Sheffield Slider PharmD 12:05 05/29/18 ([email or web address])    Culture   Final    NO GROWTH < 24 HOURS Performed at [facility], [address] [place], [place] [address]    Report Status PENDING  Incomplete  Blood Culture ID Panel (Reflexed)     Status: Abnormal   Collection Time: 05/28/18  3:35 PM  Result Value Ref Range Status   Enterococcus species NOT DETECTED NOT DETECTED Final   Listeria monocytogenes NOT DETECTED NOT DETECTED Final   Staphylococcus species DETECTED (A) NOT DETECTED Final    Comment: Methicillin (oxacillin) resistant coagulase negative staphylococcus. Possible blood culture contaminant (unless isolated from more than one blood culture draw or clinical case suggests pathogenicity). No antibiotic treatment is indicated for blood  culture contaminants. CRITICAL RESULT CALLED TO, READ BACK BY AND VERIFIED WITH: Sheffield Slider PharmD 12:05 05/29/18 ([email or web address])    Staphylococcus aureus (BCID) NOT DETECTED NOT DETECTED Final   Methicillin resistance DETECTED (A) NOT DETECTED Final    Comment: CRITICAL RESULT CALLED TO, READ BACK BY AND VERIFIED WITH: Sheffield Slider PharmD 12:05 05/29/18 ([email or web address])    Streptococcus species NOT DETECTED NOT DETECTED Final   Streptococcus agalactiae NOT DETECTED NOT DETECTED Final   Streptococcus pneumoniae NOT DETECTED NOT DETECTED Final   Streptococcus pyogenes NOT DETECTED NOT DETECTED Final   Acinetobacter baumannii  NOT DETECTED NOT DETECTED Final   Enterobacteriaceae species NOT DETECTED NOT DETECTED Final   Enterobacter cloacae complex NOT DETECTED NOT DETECTED Final   Escherichia coli NOT DETECTED NOT DETECTED Final   Klebsiella oxytoca NOT DETECTED NOT DETECTED Final   Klebsiella pneumoniae NOT DETECTED NOT DETECTED Final   Proteus species NOT DETECTED NOT DETECTED Final   Serratia marcescens NOT DETECTED NOT DETECTED Final   Haemophilus influenzae NOT DETECTED NOT DETECTED Final   Neisseria meningitidis NOT DETECTED NOT DETECTED Final   Pseudomonas aeruginosa NOT DETECTED NOT DETECTED Final   Candida albicans NOT DETECTED NOT DETECTED Final   Candida glabrata NOT DETECTED NOT DETECTED Final   Candida krusei NOT DETECTED NOT DETECTED Final   Candida parapsilosis NOT DETECTED NOT DETECTED Final   Candida tropicalis NOT DETECTED NOT DETECTED Final    Comment: Performed at [facility], [address], [place], [place] [address]  MRSA PCR Screening     Status: Abnormal   Collection Time: 05/29/18  8:30 AM  Result Value Ref  Range Status   MRSA by PCR POSITIVE (A) NEGATIVE Final    Comment:        The GeneXpert MRSA Assay (FDA approved for NASAL specimens only), is one component of a comprehensive MRSA colonization surveillance program. It is not intended to diagnose MRSA infection nor to guide or monitor treatment for MRSA infections. RESULT CALLED TO, READ BACK BY AND VERIFIED WITH: [facility] RN AT 8938 ON 05/29/18 BY S.VANHOORNE Performed at [facility], [place] [address], [place], [place] [address]      Studies: Ct Pelvis W Contrast  Result Date: 05/28/2018 CLINICAL DATA:  Stage III diabetic sacral decubitus ulcer. Evaluate for possible abscess. EXAM: CT PELVIS WITH CONTRAST TECHNIQUE: Multidetector CT imaging of the pelvis was performed using the standard protocol following the bolus administration of intravenous contrast. CONTRAST:  70mL ISOVUE-300 IOPAMIDOL  INJECTION 61% IV. COMPARISON:  02/02/2018. FINDINGS: Urinary Tract: Interval resolution of the LEFT hydroureteronephrosis since the prior CT. Scarring involving the mid and LOWER RIGHT kidney as noted previously. Benign cysts involving both kidneys. No lower urinary tract calculi. Gas within the otherwise normal-appearing urinary bladder. Bowel: Large stool burden throughout the visualized colon, particularly the rectum, though less so than on the prior CT. Visualized small bowel normal in appearance. Normal-appearing retrocecal appendix in the RIGHT mid abdomen. Vascular/Lymphatic: Moderate aortoiliofemoral atherosclerosis without evidence of aneurysm. Pelvic veins patent. Mildly enlarged enhancing BILATERAL superficial inguinal lymph nodes, the largest on the RIGHT measuring approximately 1.9 cm. No pathologic lymphadenopathy within the anatomic pelvis. Reproductive: Multiple calcified, degenerated uterine fibroids. No adnexal masses. Other: Edema/induration in the subcutaneous tissues overlying the LOWER sacrum in the midline and on the RIGHT, without evidence of soft tissue abscess. Musculoskeletal: The cortex of the sacrum and coccyx underlying the sacral decubitus appears intact, without convincing evidence of osteomyelitis. Generalized osseous demineralization. Facet degenerative changes involving the LOWER lumbar spine. No acute findings. IMPRESSION: 1. Edema/induration in the subcutaneous tissues overlying the LOWER sacrum in the midline and on the RIGHT, corresponding to the given history of sacral decubitus, without evidence of soft tissue abscess. 2. No evidence of osteomyelitis involving the sacrum or coccyx. 3. Large stool burden throughout the visualized colon, particularly the rectum, though less so than on the prior CT. Query mild rectal fecal impaction. 4. Mildly enlarged superficial inguinal lymph nodes bilaterally, the largest on the RIGHT measuring approximately 1.9 cm, likely reactive. 5. Gas  within the otherwise normal-appearing urinary bladder. Query recent instrumentation. Electronically Signed   By: Evangeline Dakin M.D.   On: 05/28/2018 17:43   Dg Shoulder Left  Result Date: 05/28/2018 CLINICAL DATA:  Left shoulder pain. No reported injury. EXAM: LEFT SHOULDER - 2+ VIEW COMPARISON:  03/02/2013 left shoulder radiograph FINDINGS: No fracture. No evidence of left acromioclavicular separation. No dislocation. No suspicious focal osseous lesions. Severe osteoarthritis at the left glenohumeral joint with interval progression and bulky inferior marginal osteophytes with complete loss of left glenohumeral joint space. No pathologic soft tissue calcifications. IMPRESSION: Severe progressive left glenohumeral joint osteoarthritis. No fracture or malalignment. Electronically Signed   By: Ilona Sorrel M.D.   On: 05/28/2018 21:05    Scheduled Meds: . [START ON 05/30/2018] Chlorhexidine Gluconate Cloth  6 each Topical Q0600  . furosemide  40 mg Intravenous Daily  . gabapentin  600 mg Oral BID  . insulin aspart  0-9 Units Subcutaneous TID WC  . insulin glargine  20 Units Subcutaneous QHS  . levothyroxine  75 mcg Oral Q0600  . metoprolol  tartrate  25 mg Oral BID  . mupirocin ointment  1 application Nasal BID  . pantoprazole  40 mg Oral Daily  . polyethylene glycol  17 g Oral BID  . rivaroxaban  20 mg Oral Daily  . senna-docusate  1 tablet Oral BID  . tamsulosin  0.4 mg Oral QPC supper    Continuous Infusions: . piperacillin-tazobactam (ZOSYN)  IV 3.375 g (05/29/18 1607)  . vancomycin       Time spent: 45mins I have personally reviewed and interpreted on  05/29/2018 daily labs,  imagings as discussed above under date review session and assessment and plans.  I reviewed all nursing notes, pharmacy notes, consultant notes,  vitals, pertinent old records  I have discussed plan of care as described above with RN , patient  on 05/29/2018   Florencia Reasons MD, PhD  [facility] Pager  [phone]. If 7PM-7AM, please contact night-coverage at [email or web address], password [email or web address] [place] 05/29/2018, 4:45 PM  LOS: 1 day

## 2018-05-29 NOTE — Progress Notes (Signed)
REFUSED LABS THIS MORNING

## 2018-05-29 NOTE — Consult Note (Signed)
[facility] Nurse wound consult note Reason for Consult: Healing Stage 4 sacral pressure injury, healed Stage 4 pressure injury to right ischial tuberosity. Wound type: Pressure Pressure Injury POA: Yes Measurement:1cm x 0.8cm x 1cm Wound bed:red, moist Drainage (amount, consistency, odor) small amount serous Periwound:with evidence of previous wound healing (scarring) Dressing procedure/placement/frequency: I will provide Nursing with guidance for the care of the wound using a calcium alginate "wick" to obliterate dead space with daily changes and continue protection to the surrounding skin with a silicone foam dressing. The erythema, induration and warmth to the medial and lateral right thigh is improving with antibiotic coverage.  I have asked Nursing to outline the areas of erythema today and to date the line so that we can better gauge efficacy of antibiotic treatment.  [facility] nursing team will not follow, but will remain available to this patient, the nursing and medical teams.  Please re-consult if needed. Thanks, Maudie Flakes, MSN, RN, [place], [facility] Abbott  Pager# [phone]

## 2018-05-30 ENCOUNTER — Telehealth: Payer: Self-pay | Admitting: Oncology

## 2018-05-30 ENCOUNTER — Inpatient Hospital Stay (HOSPITAL_COMMUNITY): Payer: Medicare Other

## 2018-05-30 DIAGNOSIS — I340 Nonrheumatic mitral (valve) insufficiency: Secondary | ICD-10-CM

## 2018-05-30 LAB — COMPREHENSIVE METABOLIC PANEL
ALBUMIN: 2.6 g/dL — AB (ref 3.5–5.0)
ALT: 49 U/L — ABNORMAL HIGH (ref 0–44)
ANION GAP: 13 (ref 5–15)
AST: 82 U/L — ABNORMAL HIGH (ref 15–41)
Alkaline Phosphatase: 142 U/L — ABNORMAL HIGH (ref 38–126)
BUN: 36 mg/dL — ABNORMAL HIGH (ref 8–23)
CO2: 24 mmol/L (ref 22–32)
Calcium: 8.4 mg/dL — ABNORMAL LOW (ref 8.9–10.3)
Chloride: 101 mmol/L (ref 98–111)
Creatinine, Ser: 1.7 mg/dL — ABNORMAL HIGH (ref 0.44–1.00)
GFR calc Af Amer: 34 mL/min — ABNORMAL LOW (ref >60)
GFR calc non Af Amer: 29 mL/min — ABNORMAL LOW (ref >60)
Glucose, Bld: 120 mg/dL — ABNORMAL HIGH (ref 70–99)
Potassium: 3.3 mmol/L — ABNORMAL LOW (ref 3.5–5.1)
Sodium: 138 mmol/L (ref 135–145)
Total Bilirubin: 1.1 mg/dL (ref 0.3–1.2)
Total Protein: 6.8 g/dL (ref 6.5–8.1)

## 2018-05-30 LAB — CBC WITH DIFFERENTIAL/PLATELET
Abs Immature Granulocytes: 0.31 10*3/uL — ABNORMAL HIGH (ref 0.00–0.07)
Basophils Absolute: 0.1 10*3/uL (ref 0.0–0.1)
Basophils Relative: 1 %
Eosinophils Absolute: 2.6 10*3/uL — ABNORMAL HIGH (ref 0.0–0.5)
Eosinophils Relative: 10 %
HCT: 35.3 % — ABNORMAL LOW (ref 36.0–46.0)
Hemoglobin: 12.1 g/dL (ref 12.0–15.0)
Immature Granulocytes: 1 %
Lymphocytes Relative: 13 %
Lymphs Abs: 3.4 10*3/uL (ref 0.7–4.0)
MCH: 29.2 pg (ref 26.0–34.0)
MCHC: 34.3 g/dL (ref 30.0–36.0)
MCV: 85.1 fL (ref 80.0–100.0)
MONOS PCT: 5 %
Monocytes Absolute: 1.3 10*3/uL — ABNORMAL HIGH (ref 0.1–1.0)
Neutro Abs: 17.9 10*3/uL — ABNORMAL HIGH (ref 1.7–7.7)
Neutrophils Relative %: 70 %
Platelets: 379 10*3/uL (ref 150–400)
RBC: 4.15 MIL/uL (ref 3.87–5.11)
RDW: 15.7 % — ABNORMAL HIGH (ref 11.5–15.5)
WBC: 25.5 10*3/uL — ABNORMAL HIGH (ref 4.0–10.5)
nRBC: 0 % (ref 0.0–0.2)

## 2018-05-30 LAB — ECHOCARDIOGRAM COMPLETE
Height: 64 in
Weight: 2432 oz

## 2018-05-30 LAB — GLUCOSE, CAPILLARY
GLUCOSE-CAPILLARY: 161 mg/dL — AB (ref 70–99)
Glucose-Capillary: 97 mg/dL (ref 70–99)
Glucose-Capillary: 204 mg/dL — ABNORMAL HIGH (ref 70–99)
Glucose-Capillary: 115 mg/dL — ABNORMAL HIGH (ref 70–99)

## 2018-05-30 LAB — LACTIC ACID, PLASMA: Lactic Acid, Venous: 1.2 mmol/L (ref 0.5–1.9)

## 2018-05-30 LAB — HEMOGLOBIN A1C
Hgb A1c MFr Bld: 6.8 % — ABNORMAL HIGH (ref 4.8–5.6)
Mean Plasma Glucose: 148.46 mg/dL

## 2018-05-30 MED ORDER — VANCOMYCIN HCL IN DEXTROSE 1-5 GM/200ML-% IV SOLN: 1000.0000 mg | INTRAVENOUS | Status: DC

## 2018-05-30 MED ORDER — POTASSIUM CHLORIDE CRYS ER 20 MEQ PO TBCR
40.0000 meq | EXTENDED_RELEASE_TABLET | Freq: Once | ORAL | Status: AC
  Administered 2018-05-30: 40 meq via ORAL
  Filled 2018-05-30: qty 2

## 2018-05-30 MED ORDER — RIVAROXABAN 15 MG PO TABS
15.0000 mg | ORAL_TABLET | Freq: Every day | ORAL | Status: DC
  Administered 2018-05-30 – 2018-06-03 (×5): 15 mg via ORAL
  Filled 2018-05-30 (×5): qty 1

## 2018-05-30 NOTE — Telephone Encounter (Signed)
[facility] Radiology to compare CT Abd/Pelvis from 02/02/2018 to CT Pelvis from 05/28/2018 to evaluate endometrial thickening.
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
## 2018-05-30 NOTE — Progress Notes (Signed)
  Pharmacy Antibiotic Note  Sarah Colon is a 72 y.o. female admitted on 05/28/2018 with cough, fever, fatigue, increasing pain, sacral decubitus, and redness of her right buttocks and right thigh. Pharmacy is asked to dose Zosyn and Vancomycin for cellulitis. First doses given in the ED. 05/30/2018  D#3 Vanc/zosyn. WBC remains elevated at 25.5. SCr rising 1.38>>1.7. Lactate down 2.38>1.2. AF since 103 on admit. CT neg for abscess and osteo. BCID + for MR CoNS, MRSA PCR +.   Plan: Continue Zosyn 3.375g IV Q8H infused over 4hrs. Change vancomycin to 1000 mg IV q48 hrs for est AUC 443 ( Scr 1.7, IBW/TBW) Measure Vanc peak and trough at steady state. Follow up renal function, culture results, and clinical course.   Height: 5\' 4"  (162.6 cm) Weight: 152 lb (68.9 kg) IBW/kg (Calculated) : 54.7  Temp (24hrs), Avg:98.9 F (37.2 C), Min:98.6 F (37 C), Max:99.2 F (37.3 C)  Recent Labs  Lab 05/28/18 1535 05/28/18 1541 05/30/18 0606  WBC 25.2*  --  25.5*  CREATININE 1.38*  --  1.70*  LATICACIDVEN  --  2.38* 1.2    Estimated Creatinine Clearance: 27.7 mL/min (A) (by C-G formula based on SCr of 1.7 mg/dL (H)).    Allergies  Allergen Reactions  . Codeine Other (See Comments)    Causes shaking   Antimicrobials this admission: Vanc 12/29 >>  Zosyn 12/29 >>   Dose adjustments this admission: 12/31 vanc 1 gm q36>>q48 for est AUC 443, SCr 1.7, IBW/TBW  Microbiology results: 12/29 BCx1:BCID MR CoNS only 1 tube drawn, possible contaminant 12/30 MRSA PCR:pos  Thank you for allowing pharmacy to be a part of this patient's care. Eudelia Bunch, Pharm.D [phone] 05/30/2018 8:41 AM

## 2018-05-30 NOTE — Progress Notes (Signed)
CSW consulted to assist with disposition as pt admitted from facility- [facility], is long term care resident there. Ambulates with wheelchair at baseline, needs total assistance. Admitted currently for cellulitis.  Unable to reach pt's family today to confer however pt plans to return to [place] at [place]. CSW updated SNF on pt's status. Will follow.  Below is assessment from previous admission after which pt moved from previous SNF to [place]. Included for history.   Sharren Bridge, MSW, LCSW Clinical Social Work 05/30/2018 [phone]      Clinical Social Work Assessment  Patient Details  Name: Sarah Colon MRN: [id number] Date of Birth: Jul 19, 1945  Date of referral:  02/03/18               Reason for consult:  Discharge Planning                           Permission sought to share information with:    Permission granted to share information::  Yes, Verbal Permission Granted             Name::     [name]             Agency::                Relationship::  Daughter             Contact Information:  [phone]  Housing/Transportation Living arrangements for the past 2 months:  [place] of Information:  Patient, Medical Team Patient Interpreter Needed:  None Criminal Activity/Legal Involvement Pertinent to Current Situation/Hospitalization:  No - Comment as needed Significant Relationships:  Adult Children, Other Family Members Lives with:  Facility Resident Do you feel safe going back to the place where you live?  Yes Need for family participation in patient care:  Yes (Comment)  Care giving concerns:  Patient is a long-term resident at [facility].   Social Worker assessment / plan:  CSW met with patient. No supports at bedside. CSW introduced role and explained that discharge planning would be discussed. Patient confirmed she was admitted from [facility] but is unsure if she will return there at discharge. She stated  her daughter is handling everything. CSW encouraged patient to make decision well in advance of discharge so that arrangements can be made. No further concerns. CSW encouraged patient to contact CSW as needed. CSW will continue to follow patient for support and facilitate discharge to SNF once medically stable.  Employment status:  Retired Nurse, adult PT Recommendations:  [place] / Referral to community resources:  [place]  Patient/Family's Response to care:  Patient unsure if she is returning to [place] or not. Patient's family supportive and involved in patient's care. Patient appreciated social work intervention.  Patient/Family's Understanding of and Emotional Response to Diagnosis, Current Treatment, and Prognosis:  Patient has a good understanding of the reason for admission and social work consult. Patient appears happy with hospital care.  Emotional Assessment Appearance:  Appears stated age Attitude/Demeanor/Rapport:  Engaged Affect (typically observed):  Appropriate, Calm, Pleasant Orientation:  Oriented to Self, Oriented to Place, Oriented to  Time, Oriented to Situation Alcohol / Substance use:  Never Used Psych involvement (Current and /or in the community):  No (Comment)  Discharge Needs  Concerns to be addressed:  Care Coordination Readmission within the last 30 days:  No Current discharge risk:  None Barriers to Discharge:  Continued Medical Work up   Candie Chroman, LCSW 02/03/2018, 3:38 PM

## 2018-05-31 DIAGNOSIS — E119 Type 2 diabetes mellitus without complications: Secondary | ICD-10-CM

## 2018-05-31 DIAGNOSIS — L03317 Cellulitis of buttock: Secondary | ICD-10-CM

## 2018-05-31 DIAGNOSIS — Z794 Long term (current) use of insulin: Secondary | ICD-10-CM

## 2018-05-31 DIAGNOSIS — I5032 Chronic diastolic (congestive) heart failure: Secondary | ICD-10-CM

## 2018-05-31 DIAGNOSIS — N179 Acute kidney failure, unspecified: Secondary | ICD-10-CM

## 2018-05-31 DIAGNOSIS — L03115 Cellulitis of right lower limb: Secondary | ICD-10-CM

## 2018-05-31 DIAGNOSIS — I10 Essential (primary) hypertension: Secondary | ICD-10-CM

## 2018-05-31 DIAGNOSIS — E1159 Type 2 diabetes mellitus with other circulatory complications: Secondary | ICD-10-CM

## 2018-05-31 LAB — COMPREHENSIVE METABOLIC PANEL
ALT: 53 U/L — ABNORMAL HIGH (ref 0–44)
ANION GAP: 12 (ref 5–15)
AST: 102 U/L — ABNORMAL HIGH (ref 15–41)
Albumin: 2.5 g/dL — ABNORMAL LOW (ref 3.5–5.0)
Alkaline Phosphatase: 146 U/L — ABNORMAL HIGH (ref 38–126)
BUN: 33 mg/dL — ABNORMAL HIGH (ref 8–23)
CO2: 25 mmol/L (ref 22–32)
Calcium: 8.8 mg/dL — ABNORMAL LOW (ref 8.9–10.3)
Chloride: 105 mmol/L (ref 98–111)
Creatinine, Ser: 1.84 mg/dL — ABNORMAL HIGH (ref 0.44–1.00)
GFR calc Af Amer: 31 mL/min — ABNORMAL LOW (ref >60)
GFR calc non Af Amer: 27 mL/min — ABNORMAL LOW (ref >60)
Glucose, Bld: 105 mg/dL — ABNORMAL HIGH (ref 70–99)
Potassium: 3.7 mmol/L (ref 3.5–5.1)
SODIUM: 142 mmol/L (ref 135–145)
Total Bilirubin: 1.1 mg/dL (ref 0.3–1.2)
Total Protein: 6.5 g/dL (ref 6.5–8.1)

## 2018-05-31 LAB — CBC WITH DIFFERENTIAL/PLATELET
Abs Immature Granulocytes: 0.38 10*3/uL — ABNORMAL HIGH (ref 0.00–0.07)
BASOS PCT: 1 %
Basophils Absolute: 0.1 10*3/uL (ref 0.0–0.1)
Eosinophils Absolute: 1.8 10*3/uL — ABNORMAL HIGH (ref 0.0–0.5)
Eosinophils Relative: 10 %
HCT: 34.7 % — ABNORMAL LOW (ref 36.0–46.0)
Hemoglobin: 11.8 g/dL — ABNORMAL LOW (ref 12.0–15.0)
Immature Granulocytes: 2 %
Lymphocytes Relative: 17 %
Lymphs Abs: 3.1 10*3/uL (ref 0.7–4.0)
MCH: 28.4 pg (ref 26.0–34.0)
MCHC: 34 g/dL (ref 30.0–36.0)
MCV: 83.6 fL (ref 80.0–100.0)
MONO ABS: 1.1 10*3/uL — AB (ref 0.1–1.0)
Monocytes Relative: 6 %
NEUTROS ABS: 12.1 10*3/uL — AB (ref 1.7–7.7)
Neutrophils Relative %: 64 %
PLATELETS: 416 10*3/uL — AB (ref 150–400)
RBC: 4.15 MIL/uL (ref 3.87–5.11)
RDW: 15.9 % — ABNORMAL HIGH (ref 11.5–15.5)
WBC: 18.6 10*3/uL — ABNORMAL HIGH (ref 4.0–10.5)
nRBC: 0 % (ref 0.0–0.2)

## 2018-05-31 LAB — GLUCOSE, CAPILLARY
Glucose-Capillary: 94 mg/dL (ref 70–99)
Glucose-Capillary: 163 mg/dL — ABNORMAL HIGH (ref 70–99)
Glucose-Capillary: 192 mg/dL — ABNORMAL HIGH (ref 70–99)
Glucose-Capillary: 180 mg/dL — ABNORMAL HIGH (ref 70–99)

## 2018-05-31 LAB — TSH: TSH: 2.486 u[IU]/mL (ref 0.350–4.500)

## 2018-05-31 LAB — CULTURE, BLOOD (ROUTINE X 2): Special Requests: ADEQUATE

## 2018-05-31 MED ORDER — SODIUM CHLORIDE 0.9 % IV SOLN
INTRAVENOUS | Status: AC
  Administered 2018-05-31: 13:00:00 via INTRAVENOUS

## 2018-05-31 MED ORDER — DOXYCYCLINE HYCLATE 100 MG PO TABS
100.0000 mg | ORAL_TABLET | Freq: Two times a day (BID) | ORAL | Status: DC
  Administered 2018-05-31 – 2018-06-03 (×6): 100 mg via ORAL
  Filled 2018-05-31 (×7): qty 1

## 2018-05-31 MED ORDER — SODIUM CHLORIDE 0.9 % IV SOLN: 100.0000 mg | Freq: Two times a day (BID) | INTRAVENOUS | Status: DC

## 2018-05-31 MED ORDER — SODIUM CHLORIDE 0.9 % IV SOLN
1.0000 g | INTRAVENOUS | Status: DC
  Administered 2018-05-31 – 2018-06-01 (×2): 1 g via INTRAVENOUS
  Filled 2018-05-31 (×2): qty 1

## 2018-05-31 MED ORDER — SODIUM CHLORIDE 0.9 % IV BOLUS: 500.0000 mL | Freq: Once | INTRAVENOUS | Status: DC

## 2018-05-31 NOTE — Progress Notes (Signed)
[facility] PROGRESS NOTE    Progress Note  Sarah Colon  WGN:[id number] DOB: Feb 02, 1946 DOA: 05/28/2018 PCP: Patient, No Pcp Per     Brief Narrative:   Sarah Colon is an 72 y.o. female past medical history of insulin-dependent diabetes mellitus type 2, chronic atrial fibrillation on Xarelto essential hypertension diastolic heart failure comes in for fever fatigue penis and redness in the right buttock area.  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 102 blood cultures grew coag negative staph likely a contaminant. MRSA screening was positive. She has remained afebrile leukocytosis is improving.  She was started empirically on IV vancomycin and Zosyn. Discontinue IV vancomycin and Zosyn started on IV doxycycline and Rocephin.  Acute kidney injury on chronic kidney disease stage III: Creatinine around 0.1, on admission 1.3 today is 1.8. Factorial in the setting of contrast CT scan of the pelvis, IV vancomycin, hypovolemia and Zosyn. Discontinue IV vancomycin and Zosyn Start her on aggressive IV fluids. Check urinary sodium and urinary creatinine. She was given Lasix yesterday which can be contributing to her acute renal failure. Follow strict I's and O's.  Acute on chronic diastolic heart failure/bilateral lower extremity pitting edema: He was given IV Lasix yesterday her creatinine worsened. Discontinue IV Lasix. We will give her a bolus of normal saline and started on IV fluids infusion.  Hypokalemia: rePleated orally now resolved.  Paroxysmal atrial fibrillation: Currently in sinus rhythm continue Lopressor and Xarelto.  Essential hypertension: Discontinue IV Lasix continue Lopressor.  Insulin-dependent diabetes mellitus type 2: Continue to hold metformin continue sliding scale insulin.  Hypothyroidism: Continue Synthroid.  Constipation: Large stool burden seen on  CT. Continue Senokot and MiraLAX twice daily.  History of left ovarian mass/endometrial thickening: Follow-up with oncology as an outpatient.      DVT prophylaxis: xarelto Family Communication:none Disposition Plan/Barrier to D/C: home once infection control Code Status:     Code Status Orders  (From admission, onward)         Start     Ordered   05/28/18 1954  Do not attempt resuscitation (DNR)  Continuous    Question Answer Comment  In the event of cardiac or respiratory ARREST Do not call a "code blue"   In the event of cardiac or respiratory ARREST Do not perform Intubation, CPR, defibrillation or ACLS   In the event of cardiac or respiratory ARREST Use medication by any route, position, wound care, and other measures to relive pain and suffering. May use oxygen, suction and manual treatment of airway obstruction as needed for comfort.   Comments Confirmed with patient on admission      05/28/18 1955        Code Status History    Date Active Date Inactive Code Status Order ID Comments User Context   02/02/2018 1624 02/09/2018 2036 DNR [id number]  MoltRomelle Starcher, DO ED   09/07/2014 2115 09/11/2014 1713 Full Code [id number]  Ivor Costa, MD Inpatient   08/15/2013 0939 08/18/2013 2327 Full Code [id number]  Geradine Girt, DO Inpatient   04/08/2013 0149 04/17/2013 2322 Full Code [id number]  Berle Mull, MD Inpatient        IV Access:    Peripheral IV   Procedures and diagnostic studies:   No results found.   Medical Consultants:    None.  Anti-Infectives:   IV doxycycline and Rocephin.  Subjective:    Sarah Colon she relates she does not feel better still  nauseated.  Objective:    Vitals:   05/30/18 0500 05/30/18 0621 05/30/18 2128 05/31/18 0447  BP:  (!) 143/93 (!) 144/88 133/68  Pulse:  76 87 70  Resp:  17 14 18   Temp:  98.6 F (37 C) 98.8 F (37.1 C) 98.6 F (37 C)  TempSrc:  Oral Oral Oral  SpO2:  96% 100% 91%  Weight: 68.9 kg     Height:         Intake/Output Summary (Last 24 hours) at 05/31/2018 1227 Last data filed at 05/30/2018 2128 Gross per 24 hour  Intake 250 ml  Output 900 ml  Net -650 ml   Filed Weights   05/28/18 1612 05/29/18 0447 05/30/18 0500  Weight: 68.2 kg 70.7 kg 68.9 kg    Exam: General exam: In no acute distress. Respiratory system: Air movement and clear to auscultation. Cardiovascular system: S1 & S2 heard, RRR. Gastrointestinal system: Abdomen is nondistended, soft and nontender.  Central nervous system: Alert and oriented. No focal neurological deficits. Extremities: No pedal edema. Skin: To have erythema that is not radiated, warm and tender to touch. Psychiatry: Judgement and insight appear normal. Mood & affect appropriate.    Data Reviewed:    Labs: Basic Metabolic Panel: Recent Labs  Lab 05/28/18 1535 05/30/18 0606 05/31/18 0405  NA 139 138 142  K 3.9 3.3* 3.7  CL 104 101 105  CO2 24 24 25   GLUCOSE 176* 120* 105*  BUN 31* 36* 33*  CREATININE 1.38* 1.70* 1.84*  CALCIUM 8.7* 8.4* 8.8*   GFR Estimated Creatinine Clearance: 25.6 mL/min (A) (by C-G formula based on SCr of 1.84 mg/dL (H)). Liver Function Tests: Recent Labs  Lab 05/28/18 1535 05/30/18 0606 05/31/18 0405  AST 93* 82* 102*  ALT 58* 49* 53*  ALKPHOS 146* 142* 146*  BILITOT 1.2 1.1 1.1  PROT 6.9 6.8 6.5  ALBUMIN 2.8* 2.6* 2.5*   No results for input(s): LIPASE, AMYLASE in the last 168 hours. No results for input(s): AMMONIA in the last 168 hours. Coagulation profile No results for input(s): INR, PROTIME in the last 168 hours.  CBC: Recent Labs  Lab 05/28/18 1535 05/30/18 0606 05/31/18 0405  WBC 25.2* 25.5* 18.6*  NEUTROABS 19.8* 17.9* 12.1*  HGB 12.6 12.1 11.8*  HCT 37.8 35.3* 34.7*  MCV 86.3 85.1 83.6  PLT 399 379 416*   Cardiac Enzymes: No results for input(s): CKTOTAL, CKMB, CKMBINDEX, TROPONINI in the last 168 hours. BNP (last 3 results) No results for input(s): PROBNP in the last 8760  hours. CBG: Recent Labs  Lab 05/30/18 1158 05/30/18 1752 05/30/18 2126 05/31/18 0748 05/31/18 1139  GLUCAP 204* 115* 161* 94 163*   D-Dimer: No results for input(s): DDIMER in the last 72 hours. Hgb A1c: Recent Labs    05/30/18 0606  HGBA1C 6.8*   Lipid Profile: No results for input(s): CHOL, HDL, LDLCALC, TRIG, CHOLHDL, LDLDIRECT in the last 72 hours. Thyroid function studies: Recent Labs    05/31/18 0405  TSH 2.486   Anemia work up: No results for input(s): VITAMINB12, FOLATE, FERRITIN, TIBC, IRON, RETICCTPCT in the last 72 hours. Sepsis Labs: Recent Labs  Lab 05/28/18 1535 05/28/18 1541 05/30/18 0606 05/31/18 0405  WBC 25.2*  --  25.5* 18.6*  LATICACIDVEN  --  2.38* 1.2  --    Microbiology Recent Results (from the past 240 hour(s))  Culture, blood (Routine x 2)     Status: Abnormal   Collection Time: 05/28/18  3:35 PM  Result Value  Ref Range Status   Specimen Description   Final    RIGHT ANTECUBITAL Performed at [facility] [address] [place], [place] [address]    Special Requests   Final    BOTTLES DRAWN AEROBIC AND ANAEROBIC Blood Culture adequate volume Performed at [facility] [address], [place], [place] [address]    Culture  Setup Time   Final    GRAM POSITIVE COCCI IN BOTH AEROBIC AND ANAEROBIC BOTTLES CRITICAL RESULT CALLED TO, READ BACK BY AND VERIFIED WITH: Sheffield Slider PharmD 12:05 05/29/18 ([email or web address])    Culture (A)  Final    STAPHYLOCOCCUS SPECIES (COAGULASE NEGATIVE) THE SIGNIFICANCE OF ISOLATING THIS ORGANISM FROM A SINGLE VENIPUNCTURE CANNOT BE PREDICTED WITHOUT FURTHER CLINICAL AND CULTURE CORRELATION. SUSCEPTIBILITIES AVAILABLE ONLY ON REQUEST. Performed at [facility], [place] [address] [place], [place] [address]    Report Status 05/31/2018 FINAL  Final  Blood Culture ID Panel (Reflexed)     Status: Abnormal   Collection Time: 05/28/18  3:35 PM  Result Value Ref Range Status    Enterococcus species NOT DETECTED NOT DETECTED Final   Listeria monocytogenes NOT DETECTED NOT DETECTED Final   Staphylococcus species DETECTED (A) NOT DETECTED Final    Comment: Methicillin (oxacillin) resistant coagulase negative staphylococcus. Possible blood culture contaminant (unless isolated from more than one blood culture draw or clinical case suggests pathogenicity). No antibiotic treatment is indicated for blood  culture contaminants. CRITICAL RESULT CALLED TO, READ BACK BY AND VERIFIED WITH: Sheffield Slider PharmD 12:05 05/29/18 ([email or web address])    Staphylococcus aureus (BCID) NOT DETECTED NOT DETECTED Final   Methicillin resistance DETECTED (A) NOT DETECTED Final    Comment: CRITICAL RESULT CALLED TO, READ BACK BY AND VERIFIED WITH: Sheffield Slider PharmD 12:05 05/29/18 ([email or web address])    Streptococcus species NOT DETECTED NOT DETECTED Final   Streptococcus agalactiae NOT DETECTED NOT DETECTED Final   Streptococcus pneumoniae NOT DETECTED NOT DETECTED Final   Streptococcus pyogenes NOT DETECTED NOT DETECTED Final   Acinetobacter baumannii NOT DETECTED NOT DETECTED Final   Enterobacteriaceae species NOT DETECTED NOT DETECTED Final   Enterobacter cloacae complex NOT DETECTED NOT DETECTED Final   Escherichia coli NOT DETECTED NOT DETECTED Final   Klebsiella oxytoca NOT DETECTED NOT DETECTED Final   Klebsiella pneumoniae NOT DETECTED NOT DETECTED Final   Proteus species NOT DETECTED NOT DETECTED Final   Serratia marcescens NOT DETECTED NOT DETECTED Final   Haemophilus influenzae NOT DETECTED NOT DETECTED Final   Neisseria meningitidis NOT DETECTED NOT DETECTED Final   Pseudomonas aeruginosa NOT DETECTED NOT DETECTED Final   Candida albicans NOT DETECTED NOT DETECTED Final   Candida glabrata NOT DETECTED NOT DETECTED Final   Candida krusei NOT DETECTED NOT DETECTED Final   Candida parapsilosis NOT DETECTED NOT DETECTED Final   Candida tropicalis NOT DETECTED NOT DETECTED Final    Comment:  Performed at [facility], [address], [place], [place] [address]  MRSA PCR Screening     Status: Abnormal   Collection Time: 05/29/18  8:30 AM  Result Value Ref Range Status   MRSA by PCR POSITIVE (A) NEGATIVE Final    Comment:        The GeneXpert MRSA Assay (FDA approved for NASAL specimens only), is one component of a comprehensive MRSA colonization surveillance program. It is not intended to diagnose MRSA infection nor to guide or monitor treatment for MRSA infections. RESULT CALLED TO, READ BACK BY AND VERIFIED WITH: [facility] RN AT  1224 ON 05/29/18 BY S.VANHOORNE Performed at [facility], [place] [address], [place], [place] [address]      Medications:   . Chlorhexidine Gluconate Cloth  6 each Topical Q0600  . gabapentin  600 mg Oral BID  . insulin aspart  0-9 Units Subcutaneous TID WC  . insulin glargine  20 Units Subcutaneous QHS  . levothyroxine  75 mcg Oral Q0600  . metoprolol tartrate  25 mg Oral BID  . mupirocin ointment  1 application Nasal BID  . pantoprazole  40 mg Oral Daily  . polyethylene glycol  17 g Oral BID  . rivaroxaban  15 mg Oral Daily  . senna-docusate  1 tablet Oral BID  . tamsulosin  0.4 mg Oral QPC supper   Continuous Infusions: . piperacillin-tazobactam (ZOSYN)  IV 3.375 g (05/31/18 0751)  . vancomycin       LOS: 3 days   Charlynne Cousins  [facility]   *Please refer to [email or web address], password [email or web address] to get updated schedule on who will round on this patient, as hospitalists switch teams weekly. If 7PM-7AM, please contact night-coverage at [email or web address], password [email or web address] for any overnight needs.  05/31/2018, 12:27 PM

## 2018-05-31 NOTE — Progress Notes (Signed)
PHARMACIST - PHYSICIAN COMMUNICATION  CONCERNING: Antibiotic IV to Oral Route Change Policy  RECOMMENDATION: This patient is receiving doxycycline by the intravenous route.  Based on criteria approved by the Pharmacy and Therapeutics Committee, the antibiotic(s) is/are being converted to the equivalent oral dose form(s).   DESCRIPTION: These criteria include:  Patient being treated for a respiratory tract infection, urinary tract infection, cellulitis or clostridium difficile associated diarrhea if on metronidazole  The patient is not neutropenic and does not exhibit a GI malabsorption state  The patient is eating (either orally or via tube) and/or has been taking other orally administered medications for a least 24 hours  The patient is improving clinically and has a Tmax < 100.5  If you have questions about this conversion, please contact the Pharmacy Department  []   [phone] )  [facility] []   [phone] )  [facility] []   [phone] )  [facility] []   [phone] )  [facility] [x]   [phone] )  [place], [place].D [phone] 05/31/2018 12:39 PM

## 2018-06-01 LAB — GLUCOSE, CAPILLARY
Glucose-Capillary: 130 mg/dL — ABNORMAL HIGH (ref 70–99)
Glucose-Capillary: 139 mg/dL — ABNORMAL HIGH (ref 70–99)
Glucose-Capillary: 132 mg/dL — ABNORMAL HIGH (ref 70–99)
Glucose-Capillary: 173 mg/dL — ABNORMAL HIGH (ref 70–99)

## 2018-06-01 LAB — BASIC METABOLIC PANEL
Anion gap: 13 (ref 5–15)
BUN: 31 mg/dL — ABNORMAL HIGH (ref 8–23)
CO2: 23 mmol/L (ref 22–32)
Calcium: 8.5 mg/dL — ABNORMAL LOW (ref 8.9–10.3)
Chloride: 105 mmol/L (ref 98–111)
Creatinine, Ser: 1.6 mg/dL — ABNORMAL HIGH (ref 0.44–1.00)
GFR calc Af Amer: 36 mL/min — ABNORMAL LOW (ref >60)
GFR calc non Af Amer: 31 mL/min — ABNORMAL LOW (ref >60)
GLUCOSE: 176 mg/dL — AB (ref 70–99)
Potassium: 3.4 mmol/L — ABNORMAL LOW (ref 3.5–5.1)
Sodium: 141 mmol/L (ref 135–145)

## 2018-06-01 MED ORDER — SENNOSIDES 8.6 MG PO TABS: 2.0000 | ORAL_TABLET | Freq: Every day | ORAL | Status: DC

## 2018-06-01 MED ORDER — RIVAROXABAN 20 MG PO TABS: 20.0000 mg | ORAL_TABLET | Freq: Every day | ORAL | Status: DC

## 2018-06-01 MED ORDER — HYDRALAZINE HCL 20 MG/ML IJ SOLN: 5.0000 mg | Freq: Four times a day (QID) | INTRAMUSCULAR | Status: DC | PRN

## 2018-06-01 MED ORDER — FERROUS SULFATE 325 (65 FE) MG PO TABS
325.0000 mg | ORAL_TABLET | Freq: Every day | ORAL | Status: DC
  Administered 2018-06-01 – 2018-06-03 (×2): 325 mg via ORAL
  Filled 2018-06-01 (×3): qty 1

## 2018-06-01 MED ORDER — ISOSORB DINITRATE-HYDRALAZINE 20-37.5 MG PO TABS
1.0000 | ORAL_TABLET | Freq: Three times a day (TID) | ORAL | Status: DC
  Administered 2018-06-01 – 2018-06-03 (×5): 1 via ORAL
  Filled 2018-06-01 (×8): qty 1

## 2018-06-01 MED ORDER — DILTIAZEM HCL 90 MG PO TABS: 180.0000 mg | ORAL_TABLET | Freq: Every day | ORAL | Status: DC

## 2018-06-01 MED ORDER — INSULIN ASPART 100 UNIT/ML ~~LOC~~ SOLN
0.0000 [IU] | Freq: Three times a day (TID) | SUBCUTANEOUS | Status: DC
  Administered 2018-06-01 (×2): 2 [IU] via SUBCUTANEOUS
  Administered 2018-06-02: 5 [IU] via SUBCUTANEOUS
  Administered 2018-06-02: 2 [IU] via SUBCUTANEOUS
  Administered 2018-06-03: 5 [IU] via SUBCUTANEOUS
  Administered 2018-06-03: 2 [IU] via SUBCUTANEOUS

## 2018-06-01 MED ORDER — POTASSIUM CHLORIDE CRYS ER 20 MEQ PO TBCR
40.0000 meq | EXTENDED_RELEASE_TABLET | Freq: Two times a day (BID) | ORAL | Status: AC
  Administered 2018-06-01 (×2): 40 meq via ORAL
  Filled 2018-06-01 (×2): qty 2

## 2018-06-01 MED ORDER — INSULIN ASPART 100 UNIT/ML ~~LOC~~ SOLN
0.0000 [IU] | Freq: Every day | SUBCUTANEOUS | Status: DC
  Administered 2018-06-02: 2 [IU] via SUBCUTANEOUS

## 2018-06-01 MED ORDER — ALLOPURINOL 100 MG PO TABS
100.0000 mg | ORAL_TABLET | Freq: Every day | ORAL | Status: DC
  Administered 2018-06-01 – 2018-06-03 (×2): 100 mg via ORAL
  Filled 2018-06-01 (×3): qty 1

## 2018-06-01 MED ORDER — ALUM & MAG HYDROXIDE-SIMETH 200-200-20 MG/5ML PO SUSP
30.0000 mL | ORAL | Status: DC | PRN
  Administered 2018-06-01: 30 mL via ORAL
  Filled 2018-06-01: qty 30

## 2018-06-01 MED ORDER — DILTIAZEM HCL ER COATED BEADS 180 MG PO CP24
180.0000 mg | ORAL_CAPSULE | Freq: Every day | ORAL | Status: DC
  Administered 2018-06-01 – 2018-06-03 (×3): 180 mg via ORAL
  Filled 2018-06-01 (×5): qty 1

## 2018-06-01 MED ORDER — HYDROCHLOROTHIAZIDE 25 MG PO TABS
25.0000 mg | ORAL_TABLET | Freq: Every day | ORAL | Status: DC
  Administered 2018-06-01 – 2018-06-03 (×3): 25 mg via ORAL
  Filled 2018-06-01 (×5): qty 1

## 2018-06-01 MED ORDER — INSULIN ASPART 100 UNIT/ML ~~LOC~~ SOLN
4.0000 [IU] | Freq: Three times a day (TID) | SUBCUTANEOUS | Status: DC
  Administered 2018-06-01 (×2): 4 [IU] via SUBCUTANEOUS
  Administered 2018-06-02: 6 [IU] via SUBCUTANEOUS
  Administered 2018-06-02 – 2018-06-03 (×4): 4 [IU] via SUBCUTANEOUS

## 2018-06-01 MED ORDER — VITAMIN D 25 MCG (1000 UNIT) PO TABS
1000.0000 [IU] | ORAL_TABLET | Freq: Every day | ORAL | Status: DC
  Administered 2018-06-01 – 2018-06-03 (×2): 1000 [IU] via ORAL
  Filled 2018-06-01 (×3): qty 1

## 2018-06-01 NOTE — Progress Notes (Addendum)
[facility] PROGRESS NOTE    Progress Note  Sarah Colon  ZDG:[id number] DOB: March 21, 1946 DOA: 05/28/2018 PCP: Patient, No Pcp Per     Brief Narrative:   Sarah Colon is an 73 y.o. female past medical history of insulin-dependent diabetes mellitus type 2, chronic atrial fibrillation on Xarelto essential hypertension diastolic heart failure comes in for fever fatigue penis and redness in the right buttock area.  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 1/2 blood cultures grew coag negative staph likely a contaminant. MRSA screening was positive. She has remained afebrile leukocytosis is improving. continue IV Rocephin and doxycycline. Still with significant erythema.  Acute kidney injury on chronic kidney disease stage III: Baseline creatinine around 1, peaked at 1.8, improving with IV fluid hydration. Multifactorial in the setting of contrast CT scan of the pelvis, IV vancomycin, hypovolemia, IV lasix and Zosyn. Follow strict I's and O's.  Acute on chronic diastolic heart failure/bilateral lower extremity pitting edema: Continue beta-blockers. Can start an ACE inhibitor once her creatinine returns to baseline.  Hypokalemia: Low today replete orally recheck in the morning.  Likely due to hypovolemia.  Paroxysmal atrial fibrillation: Currently in sinus rhythm continue Lopressor and Xarelto.  Essential hypertension: Discontinue IV Lasix continue Lopressor.  Insulin-dependent diabetes mellitus type 2: Continue to hold metformin add sliding scale insulin.  Hypothyroidism: Continue Synthroid.  Constipation: Large stool burden seen on CT. Continue Senokot and MiraLAX twice daily.  History of left ovarian mass/endometrial thickening: Follow-up with oncology as an outpatient.      DVT prophylaxis: xarelto Family Communication:none Disposition Plan/Barrier to D/C: home once  infection control Code Status:     Code Status Orders  (From admission, onward)         Start     Ordered   05/28/18 1954  Do not attempt resuscitation (DNR)  Continuous    Question Answer Comment  In the event of cardiac or respiratory ARREST Do not call a "code blue"   In the event of cardiac or respiratory ARREST Do not perform Intubation, CPR, defibrillation or ACLS   In the event of cardiac or respiratory ARREST Use medication by any route, position, wound care, and other measures to relive pain and suffering. May use oxygen, suction and manual treatment of airway obstruction as needed for comfort.   Comments Confirmed with patient on admission      05/28/18 1955        Code Status History    Date Active Date Inactive Code Status Order ID Comments User Context   02/02/2018 1624 02/09/2018 2036 DNR [id number]  MoltRomelle Starcher, DO ED   09/07/2014 2115 09/11/2014 1713 Full Code [id number]  Ivor Costa, MD Inpatient   08/15/2013 0939 08/18/2013 2327 Full Code [id number]  Geradine Girt, DO Inpatient   04/08/2013 0149 04/17/2013 2322 Full Code [id number]  Berle Mull, MD Inpatient        IV Access:    Peripheral IV   Procedures and diagnostic studies:   No results found.   Medical Consultants:    None.  Anti-Infectives:   IV doxycycline and Rocephin.  Subjective:    Sarah Colon not nauseated today total tolerated her breakfast.  Objective:    Vitals:   05/31/18 0447 05/31/18 1415 05/31/18 2200 06/01/18 0630  BP: 133/68 (!) 176/85 (!) 173/91 (!) 186/79  Pulse: 70 71 77 68  Resp: 18 16 18 16   Temp: 98.6 F (37 C) 98.3  F (36.8 C) 98.4 F (36.9 C) 98.2 F (36.8 C)  TempSrc: Oral Oral Oral Oral  SpO2: 91% 96% 100% 96%  Weight:      Height:        Intake/Output Summary (Last 24 hours) at 06/01/2018 0943 Last data filed at 06/01/2018 0653 Gross per 24 hour  Intake 680 ml  Output 1450 ml  Net -770 ml   Filed Weights   05/28/18 1612 05/29/18 0447  05/30/18 0500  Weight: 68.2 kg 70.7 kg 68.9 kg    Exam: General exam: In no acute distress. Respiratory system: Air movement and clear to auscultation. Cardiovascular system: S1 & S2 heard, RRR. Gastrointestinal system: Abdomen is nondistended, soft and nontender.  Central nervous system: Alert and oriented. No focal neurological deficits. Extremities: No pedal edema. Skin: To have erythema that is not radiated, warm and tender to touch. Psychiatry: Judgement and insight appear normal. Mood & affect appropriate.    Data Reviewed:    Labs: Basic Metabolic Panel: Recent Labs  Lab 05/28/18 1535 05/30/18 0606 05/31/18 0405 06/01/18 0349  NA 139 138 142 141  K 3.9 3.3* 3.7 3.4*  CL 104 101 105 105  CO2 24 24 25 23   GLUCOSE 176* 120* 105* 176*  BUN 31* 36* 33* 31*  CREATININE 1.38* 1.70* 1.84* 1.60*  CALCIUM 8.7* 8.4* 8.8* 8.5*   GFR Estimated Creatinine Clearance: 29.4 mL/min (A) (by C-G formula based on SCr of 1.6 mg/dL (H)). Liver Function Tests: Recent Labs  Lab 05/28/18 1535 05/30/18 0606 05/31/18 0405  AST 93* 82* 102*  ALT 58* 49* 53*  ALKPHOS 146* 142* 146*  BILITOT 1.2 1.1 1.1  PROT 6.9 6.8 6.5  ALBUMIN 2.8* 2.6* 2.5*   No results for input(s): LIPASE, AMYLASE in the last 168 hours. No results for input(s): AMMONIA in the last 168 hours. Coagulation profile No results for input(s): INR, PROTIME in the last 168 hours.  CBC: Recent Labs  Lab 05/28/18 1535 05/30/18 0606 05/31/18 0405  WBC 25.2* 25.5* 18.6*  NEUTROABS 19.8* 17.9* 12.1*  HGB 12.6 12.1 11.8*  HCT 37.8 35.3* 34.7*  MCV 86.3 85.1 83.6  PLT 399 379 416*   Cardiac Enzymes: No results for input(s): CKTOTAL, CKMB, CKMBINDEX, TROPONINI in the last 168 hours. BNP (last 3 results) No results for input(s): PROBNP in the last 8760 hours. CBG: Recent Labs  Lab 05/31/18 0748 05/31/18 1139 05/31/18 1735 05/31/18 2156 06/01/18 0737  GLUCAP 94 163* 192* 180* 130*   D-Dimer: No results  for input(s): DDIMER in the last 72 hours. Hgb A1c: Recent Labs    05/30/18 0606  HGBA1C 6.8*   Lipid Profile: No results for input(s): CHOL, HDL, LDLCALC, TRIG, CHOLHDL, LDLDIRECT in the last 72 hours. Thyroid function studies: Recent Labs    05/31/18 0405  TSH 2.486   Anemia work up: No results for input(s): VITAMINB12, FOLATE, FERRITIN, TIBC, IRON, RETICCTPCT in the last 72 hours. Sepsis Labs: Recent Labs  Lab 05/28/18 1535 05/28/18 1541 05/30/18 0606 05/31/18 0405  WBC 25.2*  --  25.5* 18.6*  LATICACIDVEN  --  2.38* 1.2  --    Microbiology Recent Results (from the past 240 hour(s))  Culture, blood (Routine x 2)     Status: Abnormal   Collection Time: 05/28/18  3:35 PM  Result Value Ref Range Status   Specimen Description   Final    RIGHT ANTECUBITAL Performed at [facility], [place] [address] [place], [place] [address]    Special  Requests   Final    BOTTLES DRAWN AEROBIC AND ANAEROBIC Blood Culture adequate volume Performed at [facility] [place] [address] [place], [place] [address]    Culture  Setup Time   Final    GRAM POSITIVE COCCI IN BOTH AEROBIC AND ANAEROBIC BOTTLES CRITICAL RESULT CALLED TO, READ BACK BY AND VERIFIED WITH: Sheffield Slider PharmD 12:05 05/29/18 ([email or web address])    Culture (A)  Final    STAPHYLOCOCCUS SPECIES (COAGULASE NEGATIVE) THE SIGNIFICANCE OF ISOLATING THIS ORGANISM FROM A SINGLE VENIPUNCTURE CANNOT BE PREDICTED WITHOUT FURTHER CLINICAL AND CULTURE CORRELATION. SUSCEPTIBILITIES AVAILABLE ONLY ON REQUEST. Performed at [facility], [place] [address] [place], [place] [address]    Report Status 05/31/2018 FINAL  Final  Blood Culture ID Panel (Reflexed)     Status: Abnormal   Collection Time: 05/28/18  3:35 PM  Result Value Ref Range Status   Enterococcus species NOT DETECTED NOT DETECTED Final   Listeria monocytogenes NOT DETECTED NOT DETECTED Final   Staphylococcus species DETECTED (A) NOT DETECTED  Final    Comment: Methicillin (oxacillin) resistant coagulase negative staphylococcus. Possible blood culture contaminant (unless isolated from more than one blood culture draw or clinical case suggests pathogenicity). No antibiotic treatment is indicated for blood  culture contaminants. CRITICAL RESULT CALLED TO, READ BACK BY AND VERIFIED WITH: Sheffield Slider PharmD 12:05 05/29/18 ([email or web address])    Staphylococcus aureus (BCID) NOT DETECTED NOT DETECTED Final   Methicillin resistance DETECTED (A) NOT DETECTED Final    Comment: CRITICAL RESULT CALLED TO, READ BACK BY AND VERIFIED WITH: Sheffield Slider PharmD 12:05 05/29/18 ([email or web address])    Streptococcus species NOT DETECTED NOT DETECTED Final   Streptococcus agalactiae NOT DETECTED NOT DETECTED Final   Streptococcus pneumoniae NOT DETECTED NOT DETECTED Final   Streptococcus pyogenes NOT DETECTED NOT DETECTED Final   Acinetobacter baumannii NOT DETECTED NOT DETECTED Final   Enterobacteriaceae species NOT DETECTED NOT DETECTED Final   Enterobacter cloacae complex NOT DETECTED NOT DETECTED Final   Escherichia coli NOT DETECTED NOT DETECTED Final   Klebsiella oxytoca NOT DETECTED NOT DETECTED Final   Klebsiella pneumoniae NOT DETECTED NOT DETECTED Final   Proteus species NOT DETECTED NOT DETECTED Final   Serratia marcescens NOT DETECTED NOT DETECTED Final   Haemophilus influenzae NOT DETECTED NOT DETECTED Final   Neisseria meningitidis NOT DETECTED NOT DETECTED Final   Pseudomonas aeruginosa NOT DETECTED NOT DETECTED Final   Candida albicans NOT DETECTED NOT DETECTED Final   Candida glabrata NOT DETECTED NOT DETECTED Final   Candida krusei NOT DETECTED NOT DETECTED Final   Candida parapsilosis NOT DETECTED NOT DETECTED Final   Candida tropicalis NOT DETECTED NOT DETECTED Final    Comment: Performed at [facility], [address] [place], [place] [address]  MRSA PCR Screening     Status: Abnormal   Collection Time: 05/29/18  8:30 AM  Result  Value Ref Range Status   MRSA by PCR POSITIVE (A) NEGATIVE Final    Comment:        The GeneXpert MRSA Assay (FDA approved for NASAL specimens only), is one component of a comprehensive MRSA colonization surveillance program. It is not intended to diagnose MRSA infection nor to guide or monitor treatment for MRSA infections. RESULT CALLED TO, READ BACK BY AND VERIFIED WITH: [facility] RN AT 2119 ON 05/29/18 BY S.VANHOORNE Performed at [facility], [place] [address], [place], [place] [address]   Culture, blood (routine x 2)     Status: None (Preliminary  result)   Collection Time: 05/30/18  5:08 PM  Result Value Ref Range Status   Specimen Description   Final    BLOOD LEFT ANTECUBITAL Performed at [place] [address] [place], [place] [address]    Special Requests   Final    BOTTLES DRAWN AEROBIC ONLY Blood Culture adequate volume Performed at [place] [address], [place], [place] [address]    Culture   Final    NO GROWTH < 24 HOURS Performed at [place] [address] [place], [place] [address]    Report Status PENDING  Incomplete  Culture, blood (routine x 2)     Status: None (Preliminary result)   Collection Time: 05/30/18  5:08 PM  Result Value Ref Range Status   Specimen Description   Final    BLOOD LEFT ARM Performed at [facility] [address] [place], [place] [address]    Special Requests   Final    BOTTLES DRAWN AEROBIC AND ANAEROBIC Blood Culture results may not be optimal due to an excessive volume of blood received in culture bottles Performed at [facility] [address] [place], [place] [address]    Culture   Final    NO GROWTH < 24 HOURS Performed at [facility] [address], [place], [place] [address]    Report Status PENDING  Incomplete     Medications:   . Chlorhexidine Gluconate Cloth  6 each Topical Q0600    . doxycycline  100 mg Oral Q12H  . gabapentin  600 mg Oral BID  . insulin aspart  0-9 Units Subcutaneous TID WC  . insulin glargine  20 Units Subcutaneous QHS  . levothyroxine  75 mcg Oral Q0600  . metoprolol tartrate  25 mg Oral BID  . mupirocin ointment  1 application Nasal BID  . pantoprazole  40 mg Oral Daily  . polyethylene glycol  17 g Oral BID  . rivaroxaban  15 mg Oral Daily  . senna-docusate  1 tablet Oral BID  . tamsulosin  0.4 mg Oral QPC supper   Continuous Infusions: . sodium chloride 100 mL/hr at 05/31/18 1307  . cefTRIAXone (ROCEPHIN)  IV Stopped (05/31/18 2345)     LOS: 4 days   Charlynne Cousins  [facility]   *Please refer to [email or web address], password [email or web address] to get updated schedule on who will round on this patient, as hospitalists switch teams weekly. If 7PM-7AM, please contact night-coverage at [email or web address], password [email or web address] for any overnight needs.  06/01/2018, 9:43 AM

## 2018-06-01 NOTE — Care Management Important Message (Signed)
Important Message  Patient Details  Name: Sarah Colon MRN: [id number] Date of Birth: 15-Feb-1946   Medicare Important Message Given:  Yes    Kerin Salen 06/01/2018, 9:44 AMImportant Message  Patient Details  Name: Sarah Colon MRN: [id number] Date of Birth: Dec 06, 1945   Medicare Important Message Given:  Yes    Kerin Salen 06/01/2018, 9:44 AM

## 2018-06-01 NOTE — Progress Notes (Signed)
I sent a message to Dr Griffin Basil RE. the 0630, 1400, AND 1741 BP 'S

## 2018-06-02 DIAGNOSIS — I480 Paroxysmal atrial fibrillation: Secondary | ICD-10-CM

## 2018-06-02 LAB — GLUCOSE, CAPILLARY
Glucose-Capillary: 131 mg/dL — ABNORMAL HIGH (ref 70–99)
Glucose-Capillary: 110 mg/dL — ABNORMAL HIGH (ref 70–99)
Glucose-Capillary: 188 mg/dL — ABNORMAL HIGH (ref 70–99)
Glucose-Capillary: 211 mg/dL — ABNORMAL HIGH (ref 70–99)

## 2018-06-02 LAB — BASIC METABOLIC PANEL
Anion gap: 12 (ref 5–15)
BUN: 30 mg/dL — ABNORMAL HIGH (ref 8–23)
CO2: 20 mmol/L — ABNORMAL LOW (ref 22–32)
Calcium: 8.3 mg/dL — ABNORMAL LOW (ref 8.9–10.3)
Chloride: 106 mmol/L (ref 98–111)
Creatinine, Ser: 1.38 mg/dL — ABNORMAL HIGH (ref 0.44–1.00)
GFR calc non Af Amer: 38 mL/min — ABNORMAL LOW (ref >60)
GFR, EST AFRICAN AMERICAN: 44 mL/min — AB (ref >60)
Glucose, Bld: 150 mg/dL — ABNORMAL HIGH (ref 70–99)
POTASSIUM: 4.4 mmol/L (ref 3.5–5.1)
Sodium: 138 mmol/L (ref 135–145)

## 2018-06-02 MED ORDER — IOHEXOL 300 MG/ML  SOLN: 15.0000 mL | Freq: Once | INTRAMUSCULAR | Status: DC | PRN

## 2018-06-02 MED ORDER — HALOPERIDOL LACTATE 5 MG/ML IJ SOLN: 1.0000 mg | Freq: Four times a day (QID) | INTRAMUSCULAR | Status: DC | PRN

## 2018-06-02 MED ORDER — POLYETHYLENE GLYCOL 3350 17 G PO PACK: 17.0000 g | PACK | Freq: Two times a day (BID) | ORAL | Status: DC

## 2018-06-02 NOTE — Progress Notes (Addendum)
[facility] PROGRESS NOTE    Progress Note  Sarah Colon  RXV:[id number] DOB: 14-Aug-1945 DOA: 05/28/2018 PCP: Patient, No Pcp Per     Brief Narrative:   Sarah Colon is an 73 y.o. female past medical history of insulin-dependent diabetes mellitus type 2, chronic atrial fibrillation on Xarelto essential hypertension diastolic heart failure comes in for fever fatigue penis and redness in the right buttock area.  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 1/2 blood cultures grew coag negative staph likely a contaminant. Erythema today is receding. We will de-escalate her treatment to oral Bactrim. Therapy evaluation is pending.  Acute kidney injury on chronic kidney disease stage III: Baseline creatinine around 1, peaked at 1.8. Multifactorial in the setting of contrast CT scan of the pelvis, IV vancomycin, hypovolemia, IV lasix and Zosyn. Creatinine close to baseline, we will KVO IV fluids.  Acute on chronic diastolic heart failure/bilateral lower extremity pitting edema: Continue beta-blockers. Can start an ACE inhibitor once her creatinine returns to baseline.  Hypokalemia: Resolved after oral repletion.  Paroxysmal atrial fibrillation: Currently in sinus rhythm continue Lopressor and Xarelto.  Essential hypertension: Continue Lopressor, resume hydrochlorothiazide. Continue by the BiDil.  Insulin-dependent diabetes mellitus type 2: Continue to hold metformin add sliding scale insulin. Controlled current regimen.  Hypothyroidism: Continue Synthroid.  Constipation: Resolved with MiraLAX patient has had multiple bowel movements.  History of left ovarian mass/endometrial thickening: Follow-up with oncology as an outpatient.    New abdominal distention with abdominal pain: Her last bowel movement was 2 days ago, will start her MiraLAX.  Get a CT scan of the abdomen and  pelvis. Consult physical therapy.   DVT prophylaxis: xarelto Family Communication:none Disposition Plan/Barrier to D/C: Once abdominal distention improve Code Status:     Code Status Orders  (From admission, onward)         Start     Ordered   05/28/18 1954  Do not attempt resuscitation (DNR)  Continuous    Question Answer Comment  In the event of cardiac or respiratory ARREST Do not call a "code blue"   In the event of cardiac or respiratory ARREST Do not perform Intubation, CPR, defibrillation or ACLS   In the event of cardiac or respiratory ARREST Use medication by any route, position, wound care, and other measures to relive pain and suffering. May use oxygen, suction and manual treatment of airway obstruction as needed for comfort.   Comments Confirmed with patient on admission      05/28/18 1955        Code Status History    Date Active Date Inactive Code Status Order ID Comments User Context   02/02/2018 1624 02/09/2018 2036 DNR [id number]  MoltRomelle Starcher, DO ED   09/07/2014 2115 09/11/2014 1713 Full Code [id number]  Ivor Costa, MD Inpatient   08/15/2013 0939 08/18/2013 2327 Full Code [id number]  Geradine Girt, DO Inpatient   04/08/2013 0149 04/17/2013 2322 Full Code [id number]  Berle Mull, MD Inpatient        IV Access:    Peripheral IV   Procedures and diagnostic studies:   No results found.   Medical Consultants:    None.  Anti-Infectives:   IV doxycycline and Rocephin.  Subjective:    Sarah Colon feels better today.  Objective:    Vitals:   06/01/18 1358 06/01/18 1741 06/01/18 2001 06/02/18 0608  BP: (!) 225/91 (!) 168/93 122/60 133/70  Pulse: 74  79 77  Resp: 18  16 16   Temp: 97.8 F (36.6 C)  98.7 F (37.1 C) 98.4 F (36.9 C)  TempSrc: Oral  Oral Oral  SpO2: 97%  99% 98%  Weight:    67.4 kg  Height:        Intake/Output Summary (Last 24 hours) at 06/02/2018 0943 Last data filed at 06/01/2018 1755 Gross per 24 hour  Intake 240 ml   Output 600 ml  Net -360 ml   Filed Weights   05/29/18 0447 05/30/18 0500 06/02/18 0608  Weight: 70.7 kg 68.9 kg 67.4 kg    Exam: General exam: In no acute distress. Respiratory system: Good air movement and clear to auscultation Cardiovascular system: S1 & S2 heard, RRR. Gastrointestinal system: Abdomen is nondistended, soft and nontender.  Central nervous system: Alert and oriented. No focal neurological deficits. Extremities: No Lower extremity edema. Skin: Tender to touch, erythema is receding Psychiatry: Judgement and insight appear normal. Mood & affect appropriate.    Data Reviewed:    Labs: Basic Metabolic Panel: Recent Labs  Lab 05/28/18 1535 05/30/18 0606 05/31/18 0405 06/01/18 0349 06/02/18 0706  NA 139 138 142 141 138  K 3.9 3.3* 3.7 3.4* 4.4  CL 104 101 105 105 106  CO2 24 24 25 23  20*  GLUCOSE 176* 120* 105* 176* 150*  BUN 31* 36* 33* 31* 30*  CREATININE 1.38* 1.70* 1.84* 1.60* 1.38*  CALCIUM 8.7* 8.4* 8.8* 8.5* 8.3*   GFR Estimated Creatinine Clearance: 33.8 mL/min (A) (by C-G formula based on SCr of 1.38 mg/dL (H)). Liver Function Tests: Recent Labs  Lab 05/28/18 1535 05/30/18 0606 05/31/18 0405  AST 93* 82* 102*  ALT 58* 49* 53*  ALKPHOS 146* 142* 146*  BILITOT 1.2 1.1 1.1  PROT 6.9 6.8 6.5  ALBUMIN 2.8* 2.6* 2.5*   No results for input(s): LIPASE, AMYLASE in the last 168 hours. No results for input(s): AMMONIA in the last 168 hours. Coagulation profile No results for input(s): INR, PROTIME in the last 168 hours.  CBC: Recent Labs  Lab 05/28/18 1535 05/30/18 0606 05/31/18 0405  WBC 25.2* 25.5* 18.6*  NEUTROABS 19.8* 17.9* 12.1*  HGB 12.6 12.1 11.8*  HCT 37.8 35.3* 34.7*  MCV 86.3 85.1 83.6  PLT 399 379 416*   Cardiac Enzymes: No results for input(s): CKTOTAL, CKMB, CKMBINDEX, TROPONINI in the last 168 hours. BNP (last 3 results) No results for input(s): PROBNP in the last 8760 hours. CBG: Recent Labs  Lab 06/01/18 0737  06/01/18 1139 06/01/18 1721 06/01/18 1959 06/02/18 0757  GLUCAP 130* 139* 132* 173* 131*   D-Dimer: No results for input(s): DDIMER in the last 72 hours. Hgb A1c: No results for input(s): HGBA1C in the last 72 hours. Lipid Profile: No results for input(s): CHOL, HDL, LDLCALC, TRIG, CHOLHDL, LDLDIRECT in the last 72 hours. Thyroid function studies: Recent Labs    05/31/18 0405  TSH 2.486   Anemia work up: No results for input(s): VITAMINB12, FOLATE, FERRITIN, TIBC, IRON, RETICCTPCT in the last 72 hours. Sepsis Labs: Recent Labs  Lab 05/28/18 1535 05/28/18 1541 05/30/18 0606 05/31/18 0405  WBC 25.2*  --  25.5* 18.6*  LATICACIDVEN  --  2.38* 1.2  --    Microbiology Recent Results (from the past 240 hour(s))  Culture, blood (Routine x 2)     Status: Abnormal   Collection Time: 05/28/18  3:35 PM  Result Value Ref Range Status   Specimen Description   Final    RIGHT ANTECUBITAL  Performed at [facility], [place] [address], [place], [place] [address]    Special Requests   Final    BOTTLES DRAWN AEROBIC AND ANAEROBIC Blood Culture adequate volume Performed at [facility] [address], [place], [place] [address]    Culture  Setup Time   Final    GRAM POSITIVE COCCI IN BOTH AEROBIC AND ANAEROBIC BOTTLES CRITICAL RESULT CALLED TO, READ BACK BY AND VERIFIED WITH: Sheffield Slider PharmD 12:05 05/29/18 ([email or web address])    Culture (A)  Final    STAPHYLOCOCCUS SPECIES (COAGULASE NEGATIVE) THE SIGNIFICANCE OF ISOLATING THIS ORGANISM FROM A SINGLE VENIPUNCTURE CANNOT BE PREDICTED WITHOUT FURTHER CLINICAL AND CULTURE CORRELATION. SUSCEPTIBILITIES AVAILABLE ONLY ON REQUEST. Performed at [facility], [place] [address], [place], [place] [address]    Report Status 05/31/2018 FINAL  Final  Blood Culture ID Panel (Reflexed)     Status: Abnormal   Collection Time: 05/28/18  3:35 PM  Result Value Ref Range Status   Enterococcus species NOT DETECTED NOT  DETECTED Final   Listeria monocytogenes NOT DETECTED NOT DETECTED Final   Staphylococcus species DETECTED (A) NOT DETECTED Final    Comment: Methicillin (oxacillin) resistant coagulase negative staphylococcus. Possible blood culture contaminant (unless isolated from more than one blood culture draw or clinical case suggests pathogenicity). No antibiotic treatment is indicated for blood  culture contaminants. CRITICAL RESULT CALLED TO, READ BACK BY AND VERIFIED WITH: Sheffield Slider PharmD 12:05 05/29/18 ([email or web address])    Staphylococcus aureus (BCID) NOT DETECTED NOT DETECTED Final   Methicillin resistance DETECTED (A) NOT DETECTED Final    Comment: CRITICAL RESULT CALLED TO, READ BACK BY AND VERIFIED WITH: Sheffield Slider PharmD 12:05 05/29/18 (wilsonm)    Streptococcus species NOT DETECTED NOT DETECTED Final   Streptococcus agalactiae NOT DETECTED NOT DETECTED Final   Streptococcus pneumoniae NOT DETECTED NOT DETECTED Final   Streptococcus pyogenes NOT DETECTED NOT DETECTED Final   Acinetobacter baumannii NOT DETECTED NOT DETECTED Final   Enterobacteriaceae species NOT DETECTED NOT DETECTED Final   Enterobacter cloacae complex NOT DETECTED NOT DETECTED Final   Escherichia coli NOT DETECTED NOT DETECTED Final   Klebsiella oxytoca NOT DETECTED NOT DETECTED Final   Klebsiella pneumoniae NOT DETECTED NOT DETECTED Final   Proteus species NOT DETECTED NOT DETECTED Final   Serratia marcescens NOT DETECTED NOT DETECTED Final   Haemophilus influenzae NOT DETECTED NOT DETECTED Final   Neisseria meningitidis NOT DETECTED NOT DETECTED Final   Pseudomonas aeruginosa NOT DETECTED NOT DETECTED Final   Candida albicans NOT DETECTED NOT DETECTED Final   Candida glabrata NOT DETECTED NOT DETECTED Final   Candida krusei NOT DETECTED NOT DETECTED Final   Candida parapsilosis NOT DETECTED NOT DETECTED Final   Candida tropicalis NOT DETECTED NOT DETECTED Final    Comment: Performed at [facility], [address], [place], [place] [address]  MRSA PCR Screening     Status: Abnormal   Collection Time: 05/29/18  8:30 AM  Result Value Ref Range Status   MRSA by PCR POSITIVE (A) NEGATIVE Final    Comment:        The GeneXpert MRSA Assay (FDA approved for NASAL specimens only), is one component of a comprehensive MRSA colonization surveillance program. It is not intended to diagnose MRSA infection nor to guide or monitor treatment for MRSA infections. RESULT CALLED TO, READ BACK BY AND VERIFIED WITH: [facility] RN AT 3716 ON 05/29/18 BY S.VANHOORNE Performed at [facility], [place] [address]  [place], [place] [address]   Culture, blood (routine x 2)     Status: None (Preliminary result)   Collection Time: 05/30/18  5:08 PM  Result Value Ref Range Status   Specimen Description   Final    BLOOD LEFT ANTECUBITAL Performed at [facility] [address] [place], [place] [address]    Special Requests   Final    BOTTLES DRAWN AEROBIC ONLY Blood Culture adequate volume Performed at [facility] [address], [place], [place] [address]    Culture   Final    NO GROWTH 3 DAYS Performed at [facility], [place] [address], [place], [place] [address]    Report Status PENDING  Incomplete  Culture, blood (routine x 2)     Status: None (Preliminary result)   Collection Time: 05/30/18  5:08 PM  Result Value Ref Range Status   Specimen Description   Final    BLOOD LEFT ARM Performed at [facility] [address], [place], [place] [address]    Special Requests   Final    BOTTLES DRAWN AEROBIC AND ANAEROBIC Blood Culture results may not be optimal due to an excessive volume of blood received in culture bottles Performed at [facility] [address], [place], [place] [address]    Culture   Final    NO GROWTH 3 DAYS Performed at [facility], [place] [address] [place], [place] [address]     Report Status PENDING  Incomplete     Medications:   . allopurinol  100 mg Oral Daily  . Chlorhexidine Gluconate Cloth  6 each Topical Q0600  . cholecalciferol  1,000 Units Oral Daily  . diltiazem  180 mg Oral Daily  . doxycycline  100 mg Oral Q12H  . ferrous sulfate  325 mg Oral Q breakfast  . gabapentin  600 mg Oral BID  . hydrochlorothiazide  25 mg Oral Daily  . insulin aspart  0-15 Units Subcutaneous TID WC  . insulin aspart  0-5 Units Subcutaneous QHS  . insulin aspart  4 Units Subcutaneous TID WC  . insulin glargine  20 Units Subcutaneous QHS  . isosorbide-hydrALAZINE  1 tablet Oral TID  . levothyroxine  75 mcg Oral Q0600  . metoprolol tartrate  25 mg Oral BID  . mupirocin ointment  1 application Nasal BID  . pantoprazole  40 mg Oral Daily  . polyethylene glycol  17 g Oral BID  . rivaroxaban  15 mg Oral Daily  . senna-docusate  1 tablet Oral BID  . tamsulosin  0.4 mg Oral QPC supper   Continuous Infusions: . cefTRIAXone (ROCEPHIN)  IV 1 g (06/01/18 1946)     LOS: 5 days   Charlynne Cousins  [facility]   *Please refer to [email or web address], password [email or web address] to get updated schedule on who will round on this patient, as hospitalists switch teams weekly. If 7PM-7AM, please contact night-coverage at [email or web address], password [email or web address] for any overnight needs.  06/02/2018, 9:43 AM

## 2018-06-02 NOTE — Progress Notes (Addendum)
Pt /t abdomen is distended. She c/o of "bad" pain and states that her stomach "hurts bad". Bowels sound are positive in all lobes but the right upper lobe. Bowel sounds are loud  and can be heard without a stethoscope . I sent a page to Dr Aileen Fass . Orders have been placed.  1300 Ms Herard had a mild size formed BM. She stated that she was hungry and did not want any xray and was not going to drink the contrast for the CT. She voiced that she was no longer in pain.. I sent a text to Dr Olevia Bowens.   1840 PT refused all meds this am. This evening she is only taking 5-6

## 2018-06-03 DIAGNOSIS — K5900 Constipation, unspecified: Secondary | ICD-10-CM

## 2018-06-03 DIAGNOSIS — I5033 Acute on chronic diastolic (congestive) heart failure: Secondary | ICD-10-CM

## 2018-06-03 LAB — BASIC METABOLIC PANEL
Anion gap: 11 (ref 5–15)
BUN: 24 mg/dL — ABNORMAL HIGH (ref 8–23)
CHLORIDE: 109 mmol/L (ref 98–111)
CO2: 20 mmol/L — AB (ref 22–32)
Calcium: 8.5 mg/dL — ABNORMAL LOW (ref 8.9–10.3)
Creatinine, Ser: 1.22 mg/dL — ABNORMAL HIGH (ref 0.44–1.00)
GFR calc Af Amer: 51 mL/min — ABNORMAL LOW (ref >60)
GFR calc non Af Amer: 44 mL/min — ABNORMAL LOW (ref >60)
Glucose, Bld: 144 mg/dL — ABNORMAL HIGH (ref 70–99)
Potassium: 3.8 mmol/L (ref 3.5–5.1)
Sodium: 140 mmol/L (ref 135–145)

## 2018-06-03 LAB — GLUCOSE, CAPILLARY
GLUCOSE-CAPILLARY: 227 mg/dL — AB (ref 70–99)
Glucose-Capillary: 129 mg/dL — ABNORMAL HIGH (ref 70–99)

## 2018-06-03 MED ORDER — DOXYCYCLINE HYCLATE 100 MG PO TABS: 100.0000 mg | ORAL_TABLET | Freq: Two times a day (BID) | ORAL | 0 refills | Status: DC
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
## 2018-06-03 NOTE — Discharge Summary (Signed)
Physician Discharge Summary  Sarah Colon GYF:[id number] DOB: 1946/05/05 DOA: 05/28/2018  PCP: Patient, No Pcp Per  Admit date: 05/28/2018 Discharge date: 06/03/2018  Admitted From: home Disposition:  Home  Recommendations for Outpatient Follow-up:  1. Follow up with PCP in 1-2 weeks 2. Please obtain BMP/CBC in one week 3. Check blood pressure and titrate antihypertensive medications as tolerated.  Home Health:No Equipment/Devices:none  Discharge Condition:stable CODE STATUS:full Diet recommendation: Low-sodium heart healthy diet  Brief/Interim Summary: 72 y.o. female past medical history of insulin-dependent diabetes mellitus type 2, chronic atrial fibrillation on Xarelto essential hypertension diastolic heart failure comes in for fever fatigue penis and redness in the right buttock area.  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 Started empirically on IV vancomycin and Zosyn due to the escalated to oral doxy and she remained afebrile with no leukocytosis she would continue oral doxy for an additional 5 days as an outpatient.  Acute kidney injury on chronic kidney disease stage III: Creatinine around 1 on admission 1.8 multifactorial in the setting of CT scan with contrast IV bank hypovolemia and  Zosyn. Her IV vancomycin and Zosyn were discontinued, her Lasix was stopped she was noted on aggressive IV fluid hydration and her creatinine improved to baseline. She will restart her ACE inhibitor which was held on admission as an outpatient.  Acute on chronic diastolic heart failure: No changes made to her medication.  Hypokalemia: It was repleted orally now resolved.  Paroxysmal atrial fibrillation: Currently in sinus rhythm continue Lopressor and Xarelto.  Essential hypertension: No change were made to her medication.  Insulin-dependent diabetes mellitus type 2: Her metformin was held on admission she will resume her regimen as an outpatient.  Discharge Instructions  Discharge Instructions    Diet - low sodium heart healthy   Complete by:  As directed    Increase activity slowly   Complete by:  As directed      Allergies as of 06/03/2018      Reactions   Codeine Other (See Comments)   Causes shaking      Medication List    TAKE these medications   acetaminophen 325 MG tablet Commonly known as:  TYLENOL Take 650 mg by mouth every 6 (six) hours as needed.   albuterol (2.5 MG/3ML) 0.083% nebulizer solution Commonly known as:  PROVENTIL Take 2.5 mg by nebulization every 4 (four) hours as needed for wheezing or shortness of breath.   allopurinol 100 MG tablet Commonly known as:  ZYLOPRIM Take 1 tablet (100 mg total) by mouth daily.   alum & mag hydroxide-simeth 200-200-20 MG/5ML suspension Commonly known as:  MAALOX/MYLANTA Take 10 mLs by mouth 3 (three) times daily as needed for indigestion or heartburn.   BOOST GLUCOSE CONTROL PO Take 237 mLs by mouth daily.   feeding supplement ([place])  Liqd Take 237 mLs by mouth daily. Chocolate   calcium carbonate 500 MG chewable tablet Commonly known as:  TUMS - dosed in mg elemental calcium Chew 2 tablets by mouth 3 (three) times daily as needed for indigestion or heartburn.   cholecalciferol 25 MCG (1000  UT) tablet Commonly known as:  VITAMIN D3 Take 1,000 Units by mouth daily.   colchicine 0.6 MG tablet Take 1.2 mg by mouth daily as needed.   DIABETIC TUSSIN EX 100 MG/5ML syrup Generic drug:  guaifenesin Take 200 mg by mouth every 4 (four) hours as needed for cough.   guaiFENesin 600 MG 12 hr tablet Commonly known as:  MUCINEX Take 1,200 mg by mouth every 12 (twelve) hours as needed for cough or to loosen phlegm.   diltiazem 90 MG tablet Commonly known as:  CARDIZEM Take 180 mg by mouth daily.   doxycycline 100 MG tablet Commonly known as:  VIBRA-TABS Take 1 tablet (100 mg total) by mouth every 12 (twelve) hours.   feeding supplement (PRO-STAT SUGAR FREE 64) Liqd Take 30 mLs by mouth 2 (two) times daily.   ferrous sulfate 325 (65 FE) MG tablet Take 325 mg by mouth daily.   gabapentin 600 MG tablet Commonly known as:  NEURONTIN Take 600 mg by mouth 2 (two) times daily.   glucagon 1 MG Solr injection Commonly known as:  GLUCAGEN Inject 1 mg into the muscle as needed for low blood sugar.   hydrochlorothiazide 25 MG tablet Commonly known as:  HYDRODIURIL Take 25 mg by mouth daily.   insulin glargine 100 UNIT/ML injection Commonly known as:  LANTUS Inject 0.2 mLs (20 Units total) into the skin at bedtime.   levothyroxine 75 MCG tablet Commonly known as:  SYNTHROID, LEVOTHROID Take 75 mcg by mouth daily.   Melatonin 1 MG Caps Take 1 mg by mouth at bedtime.   metFORMIN 500 MG tablet Commonly known as:  GLUCOPHAGE Take 500 mg by mouth 2 (two) times daily with a meal.   metoprolol tartrate 25 MG tablet Commonly known as:  LOPRESSOR Take 25 mg by mouth 2 (two) times daily.   ondansetron 4 MG tablet Commonly known as:  ZOFRAN Take 4 mg by mouth every 6 (six) hours as needed for nausea or vomiting.   pantoprazole 40 MG tablet Commonly known as:  PROTONIX Take 40 mg by mouth daily.   pioglitazone 15 MG tablet Commonly known as:  ACTOS Take 15 mg by mouth  daily.   polyethylene glycol packet Commonly known as:  MIRALAX / GLYCOLAX Take 17 g by mouth daily as needed for mild constipation.   rivaroxaban 20 MG Tabs tablet Commonly known as:  XARELTO Take 20 mg by mouth daily.   senna 8.6 MG tablet Commonly known as:  SENOKOT Take 2 tablets by mouth daily.   simethicone 125 MG chewable tablet Commonly known as:  MYLICON Chew 213 mg by mouth every 6 (six) hours as needed for flatulence.   tamsulosin 0.4 MG Caps capsule Commonly known as:  FLOMAX Take 1 capsule (0.4 mg total) by mouth daily after supper.   traMADol 50 MG tablet Commonly known as:  ULTRAM Take 1 tablet (50 mg total) by mouth every 6 (six) hours as needed for moderate pain or severe pain.       Allergies  Allergen Reactions  . Codeine Other (See Comments)    Causes shaking    Consultations:  None   Procedures/Studies: Dg Chest 2 View  Result Date: 05/28/2018  CLINICAL DATA:  Cough, fever. EXAM: CHEST - 2 VIEW COMPARISON:  Radiographs of August 25, 2015. FINDINGS: The heart size and mediastinal contours are within normal limits. Both lungs are clear. The visualized skeletal structures are unremarkable. IMPRESSION: No active cardiopulmonary disease. Electronically Signed   By: Marijo Conception, M.D.   On: 05/28/2018 16:46   Ct Pelvis W Contrast  Addendum Date: 05/31/2018   ADDENDUM REPORT: 05/31/2018 11:20 ADDENDUM: I have been asked to review the current examination with regard to potential endometrial thickening questioned on the 02/02/2018 CT. Endometrial thickening is less conspicuous currently, and what may have been a LEFT hydrosalpinx at that time has significantly improved. Pelvic ultrasound would be a better imaging study to evaluate the endometrium. Electronically Signed   By: Evangeline Dakin M.D.   On: 05/31/2018 11:20   Result Date: 05/31/2018 CLINICAL DATA:  Stage III diabetic sacral decubitus ulcer. Evaluate for possible abscess. EXAM: CT PELVIS WITH  CONTRAST TECHNIQUE: Multidetector CT imaging of the pelvis was performed using the standard protocol following the bolus administration of intravenous contrast. CONTRAST:  55mL ISOVUE-300 IOPAMIDOL INJECTION 61% IV. COMPARISON:  02/02/2018. FINDINGS: Urinary Tract: Interval resolution of the LEFT hydroureteronephrosis since the prior CT. Scarring involving the mid and LOWER RIGHT kidney as noted previously. Benign cysts involving both kidneys. No lower urinary tract calculi. Gas within the otherwise normal-appearing urinary bladder. Bowel: Large stool burden throughout the visualized colon, particularly the rectum, though less so than on the prior CT. Visualized small bowel normal in appearance. Normal-appearing retrocecal appendix in the RIGHT mid abdomen. Vascular/Lymphatic: Moderate aortoiliofemoral atherosclerosis without evidence of aneurysm. Pelvic veins patent. Mildly enlarged enhancing BILATERAL superficial inguinal lymph nodes, the largest on the RIGHT measuring approximately 1.9 cm. No pathologic lymphadenopathy within the anatomic pelvis. Reproductive: Multiple calcified, degenerated uterine fibroids. No adnexal masses. Other: Edema/induration in the subcutaneous tissues overlying the LOWER sacrum in the midline and on the RIGHT, without evidence of soft tissue abscess. Musculoskeletal: The cortex of the sacrum and coccyx underlying the sacral decubitus appears intact, without convincing evidence of osteomyelitis. Generalized osseous demineralization. Facet degenerative changes involving the LOWER lumbar spine. No acute findings. IMPRESSION: 1. Edema/induration in the subcutaneous tissues overlying the LOWER sacrum in the midline and on the RIGHT, corresponding to the given history of sacral decubitus, without evidence of soft tissue abscess. 2. No evidence of osteomyelitis involving the sacrum or coccyx. 3. Large stool burden throughout the visualized colon, particularly the rectum, though less so than  on the prior CT. Query mild rectal fecal impaction. 4. Mildly enlarged superficial inguinal lymph nodes bilaterally, the largest on the RIGHT measuring approximately 1.9 cm, likely reactive. 5. Gas within the otherwise normal-appearing urinary bladder. Query recent instrumentation. Electronically Signed: By: Evangeline Dakin M.D. On: 05/28/2018 17:43   Dg Shoulder Left  Result Date: 05/28/2018 CLINICAL DATA:  Left shoulder pain. No reported injury. EXAM: LEFT SHOULDER - 2+ VIEW COMPARISON:  03/02/2013 left shoulder radiograph FINDINGS: No fracture. No evidence of left acromioclavicular separation. No dislocation. No suspicious focal osseous lesions. Severe osteoarthritis at the left glenohumeral joint with interval progression and bulky inferior marginal osteophytes with complete loss of left glenohumeral joint space. No pathologic soft tissue calcifications. IMPRESSION: Severe progressive left glenohumeral joint osteoarthritis. No fracture or malalignment. Electronically Signed   By: Ilona Sorrel M.D.   On: 05/28/2018 21:05    (Echo, Carotid, EGD, Colonoscopy, ERCP)    Subjective: Planes feels great tolerating her diet.  Discharge Exam: Vitals:  06/03/18 0018 06/03/18 0445  BP: 140/67 (!) 147/63  Pulse: 95 80  Resp:  20  Temp:  98.4 F (36.9 C)  SpO2: 99% 100%   Vitals:   06/02/18 1723 06/02/18 2043 06/03/18 0018 06/03/18 0445  BP: (!) 184/77 (!) 176/96 140/67 (!) 147/63  Pulse: 75 98 95 80  Resp: 18 20  20   Temp: 98.4 F (36.9 C) 98.7 F (37.1 C)  98.4 F (36.9 C)  TempSrc: Oral Oral  Oral  SpO2: 99% 100% 99% 100%  Weight:      Height:        General: Pt is alert, awake, not in acute distress Cardiovascular: RRR, S1/S2 +, no rubs, no gallops Respiratory: CTA bilaterally, no wheezing, no rhonchi Abdominal: Soft, NT, ND, bowel sounds + Extremities: no edema, no cyanosis    The results of significant diagnostics from this hospitalization (including imaging,  microbiology, ancillary and laboratory) are listed below for reference.     Microbiology: Recent Results (from the past 240 hour(s))  Culture, blood (Routine x 2)     Status: Abnormal   Collection Time: 05/28/18  3:35 PM  Result Value Ref Range Status   Specimen Description   Final    RIGHT ANTECUBITAL Performed at [facility] [address], [place], [place] [address]    Special Requests   Final    BOTTLES DRAWN AEROBIC AND ANAEROBIC Blood Culture adequate volume Performed at [facility] [address], [place], [place] [address]    Culture  Setup Time   Final    GRAM POSITIVE COCCI IN BOTH AEROBIC AND ANAEROBIC BOTTLES CRITICAL RESULT CALLED TO, READ BACK BY AND VERIFIED WITH: Sheffield Slider PharmD 12:05 05/29/18 ([email or web address])    Culture (A)  Final    STAPHYLOCOCCUS SPECIES (COAGULASE NEGATIVE) THE SIGNIFICANCE OF ISOLATING THIS ORGANISM FROM A SINGLE VENIPUNCTURE CANNOT BE PREDICTED WITHOUT FURTHER CLINICAL AND CULTURE CORRELATION. SUSCEPTIBILITIES AVAILABLE ONLY ON REQUEST. Performed at [facility], [place] [address], [place],  [address]    Report Status 05/31/2018 FINAL  Final  Blood Culture ID Panel (Reflexed)     Status: Abnormal   Collection Time: 05/28/18  3:35 PM  Result Value Ref Range Status   Enterococcus species NOT DETECTED NOT DETECTED Final   Listeria monocytogenes NOT DETECTED NOT DETECTED Final   Staphylococcus species DETECTED (A) NOT DETECTED Final    Comment: Methicillin (oxacillin) resistant coagulase negative staphylococcus. Possible blood culture contaminant (unless isolated from more than one blood culture draw or clinical case suggests pathogenicity). No antibiotic treatment is indicated for blood  culture contaminants. CRITICAL RESULT CALLED TO, READ BACK BY AND VERIFIED WITH: Sheffield Slider PharmD 12:05 05/29/18 ([email or web address])    Staphylococcus aureus (BCID) NOT DETECTED NOT DETECTED Final   Methicillin  resistance DETECTED (A) NOT DETECTED Final    Comment: CRITICAL RESULT CALLED TO, READ BACK BY AND VERIFIED WITH: Sheffield Slider PharmD 12:05 05/29/18 ([email or web address])    Streptococcus species NOT DETECTED NOT DETECTED Final   Streptococcus agalactiae NOT DETECTED NOT DETECTED Final   Streptococcus pneumoniae NOT DETECTED NOT DETECTED Final   Streptococcus pyogenes NOT DETECTED NOT DETECTED Final   Acinetobacter baumannii NOT DETECTED NOT DETECTED Final   Enterobacteriaceae species NOT DETECTED NOT DETECTED Final   Enterobacter cloacae complex NOT DETECTED NOT DETECTED Final   Escherichia coli NOT DETECTED NOT DETECTED Final   Klebsiella oxytoca NOT DETECTED NOT DETECTED Final   Klebsiella pneumoniae NOT DETECTED NOT DETECTED Final   Proteus  species NOT DETECTED NOT DETECTED Final   Serratia marcescens NOT DETECTED NOT DETECTED Final   Haemophilus influenzae NOT DETECTED NOT DETECTED Final   Neisseria meningitidis NOT DETECTED NOT DETECTED Final   Pseudomonas aeruginosa NOT DETECTED NOT DETECTED Final   Candida albicans NOT DETECTED NOT DETECTED Final   Candida glabrata NOT DETECTED NOT DETECTED Final   Candida krusei NOT DETECTED NOT DETECTED Final   Candida parapsilosis NOT DETECTED NOT DETECTED Final   Candida tropicalis NOT DETECTED NOT DETECTED Final    Comment: Performed at [facility], [place] [address], [place], [place] [address]  MRSA PCR Screening     Status: Abnormal   Collection Time: 05/29/18  8:30 AM  Result Value Ref Range Status   MRSA by PCR POSITIVE (A) NEGATIVE Final    Comment:        The GeneXpert MRSA Assay (FDA approved for NASAL specimens only), is one component of a comprehensive MRSA colonization surveillance program. It is not intended to diagnose MRSA infection nor to guide or monitor treatment for MRSA infections. RESULT CALLED TO, READ BACK BY AND VERIFIED WITH: [facility] RN AT 6045 ON 05/29/18 BY S.VANHOORNE Performed at [facility], [place] [address], [place], [place] [address]   Culture, blood (routine x 2)     Status: None (Preliminary result)   Collection Time: 05/30/18  5:08 PM  Result Value Ref Range Status   Specimen Description BLOOD LEFT ANTECUBITAL  Final   Special Requests   Final    BOTTLES DRAWN AEROBIC ONLY Blood Culture adequate volume Performed at [facility] [address] [place], [place] [address]    Culture NO GROWTH 4 DAYS  Final   Report Status PENDING  Incomplete  Culture, blood (routine x 2)     Status: None (Preliminary result)   Collection Time: 05/30/18  5:08 PM  Result Value Ref Range Status   Specimen Description BLOOD LEFT ARM  Final   Special Requests   Final    BOTTLES DRAWN AEROBIC AND ANAEROBIC Blood Culture results may not be optimal due to an excessive volume of blood received in culture bottles Performed at [facility], [place] [address], [place],  [address]    Culture NO GROWTH 4 DAYS  Final   Report Status PENDING  Incomplete     Labs: BNP (last 3 results) No results for input(s): BNP in the last 8760 hours. Basic Metabolic Panel: Recent Labs  Lab 05/30/18 0606 05/31/18 0405 06/01/18 0349 06/02/18 0706 06/03/18 0347  NA 138 142 141 138 140  K 3.3* 3.7 3.4* 4.4 3.8  CL 101 105 105 106 109  CO2 24 25 23  20* 20*  GLUCOSE 120* 105* 176* 150* 144*  BUN 36* 33* 31* 30* 24*  CREATININE 1.70* 1.84* 1.60* 1.38* 1.22*  CALCIUM 8.4* 8.8* 8.5* 8.3* 8.5*   Liver Function Tests: Recent Labs  Lab 05/28/18 1535 05/30/18 0606 05/31/18 0405  AST 93* 82* 102*  ALT 58* 49* 53*  ALKPHOS 146* 142* 146*  BILITOT 1.2 1.1 1.1  PROT 6.9 6.8 6.5  ALBUMIN 2.8* 2.6* 2.5*   No results for input(s): LIPASE, AMYLASE in the last 168 hours. No results for input(s): AMMONIA in the last 168 hours. CBC: Recent Labs  Lab 05/28/18 1535 05/30/18 0606 05/31/18 0405  WBC 25.2* 25.5* 18.6*  NEUTROABS 19.8* 17.9* 12.1*  HGB 12.6  12.1 11.8*  HCT 37.8 35.3* 34.7*  MCV 86.3 85.1 83.6  PLT 399 379 416*   Cardiac Enzymes: No results for input(s): CKTOTAL, CKMB, CKMBINDEX, TROPONINI in the last 168 hours. BNP: Invalid input(s): POCBNP CBG: Recent Labs  Lab 06/02/18 0757 06/02/18 1206 06/02/18 1720 06/02/18 2302 06/03/18 0759  GLUCAP 131* 110* 188* 211* 129*   D-Dimer No results for input(s): DDIMER in the last 72 hours. Hgb A1c No results for input(s): HGBA1C in the last 72 hours. Lipid Profile No results for input(s): CHOL, HDL, LDLCALC, TRIG, CHOLHDL, LDLDIRECT in the last 72 hours. Thyroid function studies No results for input(s): TSH, T4TOTAL, T3FREE, THYROIDAB in the last 72 hours.  Invalid input(s): FREET3 Anemia work up No results for input(s): VITAMINB12, FOLATE, FERRITIN, TIBC, IRON, RETICCTPCT in the last 72 hours. Urinalysis    Component Value Date/Time   COLORURINE YELLOW 05/29/2018 1113   APPEARANCEUR CLEAR 05/29/2018 1113   LABSPEC 1.031 (H) 05/29/2018 1113   PHURINE 5.0 05/29/2018 1113   GLUCOSEU NEGATIVE 05/29/2018 1113   HGBUR SMALL (A) 05/29/2018 1113   BILIRUBINUR NEGATIVE 05/29/2018 1113   KETONESUR NEGATIVE 05/29/2018 1113   PROTEINUR 100 (A) 05/29/2018 1113   UROBILINOGEN 0.2 09/07/2014 2000   NITRITE NEGATIVE 05/29/2018 1113   LEUKOCYTESUR LARGE (A) 05/29/2018 1113   Sepsis Labs Invalid input(s): PROCALCITONIN,  WBC,  LACTICIDVEN Microbiology Recent Results (from the past 240 hour(s))  Culture, blood (Routine x 2)     Status: Abnormal   Collection Time: 05/28/18  3:35 PM  Result Value Ref Range Status   Specimen Description   Final    RIGHT ANTECUBITAL Performed at [facility], [place] [address], [place], [place] [address]    Special Requests   Final    BOTTLES DRAWN AEROBIC AND ANAEROBIC Blood Culture adequate volume Performed at [facility] [address] [place], [place] [address]    Culture  Setup Time   Final    GRAM  POSITIVE COCCI IN BOTH AEROBIC AND ANAEROBIC BOTTLES CRITICAL RESULT CALLED TO, READ BACK BY AND VERIFIED WITH: Sheffield Slider PharmD 12:05 05/29/18 ([email or web address])    Culture (A)  Final    STAPHYLOCOCCUS SPECIES (COAGULASE NEGATIVE) THE SIGNIFICANCE OF ISOLATING THIS ORGANISM FROM A SINGLE VENIPUNCTURE CANNOT BE PREDICTED WITHOUT FURTHER CLINICAL AND CULTURE CORRELATION. SUSCEPTIBILITIES AVAILABLE ONLY ON REQUEST. Performed at [facility], [place] [address], [place],  [address]    Report Status 05/31/2018 FINAL  Final  Blood Culture ID Panel (Reflexed)     Status: Abnormal   Collection Time: 05/28/18  3:35 PM  Result Value Ref Range Status   Enterococcus species NOT DETECTED NOT DETECTED Final   Listeria monocytogenes NOT DETECTED NOT DETECTED Final   Staphylococcus species DETECTED (A) NOT DETECTED Final    Comment: Methicillin (oxacillin) resistant coagulase negative staphylococcus. Possible blood culture contaminant (unless isolated from more than one blood culture draw or clinical case suggests pathogenicity). No antibiotic treatment is indicated for blood  culture contaminants. CRITICAL RESULT CALLED TO, READ BACK BY AND VERIFIED WITH: Sheffield Slider PharmD 12:05 05/29/18 ([email or web address])    Staphylococcus aureus (BCID) NOT DETECTED NOT DETECTED Final   Methicillin resistance DETECTED (A) NOT DETECTED Final    Comment: CRITICAL RESULT CALLED TO, READ BACK BY AND VERIFIED WITH: Sheffield Slider PharmD 12:05 05/29/18 ([email or web address])    Streptococcus species NOT DETECTED NOT DETECTED Final   Streptococcus agalactiae NOT DETECTED NOT DETECTED Final   Streptococcus pneumoniae NOT DETECTED NOT DETECTED Final   Streptococcus pyogenes NOT DETECTED NOT DETECTED Final   Acinetobacter baumannii  NOT DETECTED NOT DETECTED Final   Enterobacteriaceae species NOT DETECTED NOT DETECTED Final   Enterobacter cloacae complex NOT DETECTED NOT DETECTED Final   Escherichia coli NOT DETECTED NOT DETECTED Final    Klebsiella oxytoca NOT DETECTED NOT DETECTED Final   Klebsiella pneumoniae NOT DETECTED NOT DETECTED Final   Proteus species NOT DETECTED NOT DETECTED Final   Serratia marcescens NOT DETECTED NOT DETECTED Final   Haemophilus influenzae NOT DETECTED NOT DETECTED Final   Neisseria meningitidis NOT DETECTED NOT DETECTED Final   Pseudomonas aeruginosa NOT DETECTED NOT DETECTED Final   Candida albicans NOT DETECTED NOT DETECTED Final   Candida glabrata NOT DETECTED NOT DETECTED Final   Candida krusei NOT DETECTED NOT DETECTED Final   Candida parapsilosis NOT DETECTED NOT DETECTED Final   Candida tropicalis NOT DETECTED NOT DETECTED Final    Comment: Performed at [facility], [place] [address], [place], [place] [address]  MRSA PCR Screening     Status: Abnormal   Collection Time: 05/29/18  8:30 AM  Result Value Ref Range Status   MRSA by PCR POSITIVE (A) NEGATIVE Final    Comment:        The GeneXpert MRSA Assay (FDA approved for NASAL specimens only), is one component of a comprehensive MRSA colonization surveillance program. It is not intended to diagnose MRSA infection nor to guide or monitor treatment for MRSA infections. RESULT CALLED TO, READ BACK BY AND VERIFIED WITH: [facility] RN AT 2841 ON 05/29/18 BY S.VANHOORNE Performed at [facility], [place] [address] [place], [place] [address]   Culture, blood (routine x 2)     Status: None (Preliminary result)   Collection Time: 05/30/18  5:08 PM  Result Value Ref Range Status   Specimen Description BLOOD LEFT ANTECUBITAL  Final   Special Requests   Final    BOTTLES DRAWN AEROBIC ONLY Blood Culture adequate volume Performed at [facility] [address], [place], [place] [address]    Culture NO GROWTH 4 DAYS  Final   Report Status PENDING  Incomplete  Culture, blood (routine x 2)     Status: None (Preliminary result)   Collection Time: 05/30/18  5:08 PM  Result Value Ref Range Status    Specimen Description BLOOD LEFT ARM  Final   Special Requests   Final    BOTTLES DRAWN AEROBIC AND ANAEROBIC Blood Culture results may not be optimal due to an excessive volume of blood received in culture bottles Performed at [facility], [place] [address] [place], [place] [address]    Culture NO GROWTH 4 DAYS  Final   Report Status PENDING  Incomplete     Time coordinating discharge: 40 minutes  SIGNED:   Charlynne Cousins, MD  [facility] 06/03/2018, 9:43 AM Pager   If 7PM-7AM, please contact night-coverage [email or web address] Password [email or web address]

## 2018-06-03 NOTE — Progress Notes (Signed)
Pt discharged to facility via [facility]. All pt belongings sent with pt. Report called to facility by previous RN. Pt has no complaints or concerns at this time.
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
## 2018-06-04 LAB — CULTURE, BLOOD (ROUTINE X 2)
Culture: NO GROWTH
Culture: NO GROWTH
Special Requests: ADEQUATE

## 2018-06-05 ENCOUNTER — Other Ambulatory Visit: Payer: Self-pay | Admitting: Gynecologic Oncology

## 2018-06-05 ENCOUNTER — Telehealth: Payer: Self-pay | Admitting: Oncology

## 2018-06-05 DIAGNOSIS — R9389 Abnormal findings on diagnostic imaging of other specified body structures: Secondary | ICD-10-CM

## 2018-06-05 NOTE — Telephone Encounter (Signed)
Called [name] (patient's daughter) and discussed the findings from the CT pelvis on 05/31/18.  Advised that the andexal cyst has improved and that the endometrial thickening is less conspicuous.  Discussed that a pelvic ultrasound has been recommended to assess the endometrial thickening in more detail and to see if she still would need a D&C.  [name] verbalized agreement and understanding and would like Antonia to have a pelvic ultrasound.

## 2018-06-05 NOTE — Progress Notes (Signed)
Left pelvic cyst has resolved on recent CT.  Lining of the uterus appeared less thick but [place] recommended.  US pelvic/transvag ordered to evaluate the endometrial lining to see if a D&C is still indicated.

## 2018-06-06 ENCOUNTER — Telehealth: Payer: Self-pay | Admitting: Oncology

## 2018-06-06 NOTE — Telephone Encounter (Signed)
Called [facility] and spoke to patient's nurse, Marval Regal and notified her of appointments for 06/12/18 and 06/13/18.  She said she will arrange transportation.

## 2018-06-06 NOTE — Telephone Encounter (Signed)
Called patient's daughter, Britt Boozer, with appointments for pelvic US on 06/12/18 and follow up with Dr. Fermin Schwab on 06/13/18.  Called [facility] 3 times with no answer.

## 2018-06-12 ENCOUNTER — Ambulatory Visit (HOSPITAL_COMMUNITY): Payer: Medicare Other

## 2018-06-12 ENCOUNTER — Telehealth: Payer: Self-pay | Admitting: Oncology

## 2018-06-12 NOTE — Telephone Encounter (Signed)
Kim from [facility] called and said she was out last week and patient's appointment for the [place] today was not set up with transportation.  Advised her that we will reschedule it and also Dr. Mora Bellman apt for tomorrow.  Called back and left a message that [place] will be on 06/21/18 and Dr. Mora Bellman apt will be on 06/27/18.  Requested a return call for verification.

## 2018-06-13 ENCOUNTER — Inpatient Hospital Stay: Payer: Medicare Other | Admitting: Gynecology

## 2018-06-13 NOTE — Telephone Encounter (Signed)
Kim from [facility] called back and verified apt times and dates for [place] and Dr. Fermin Schwab.

## 2018-06-21 ENCOUNTER — Ambulatory Visit (HOSPITAL_COMMUNITY)
Admission: RE | Admit: 2018-06-21 | Discharge: 2018-06-21 | Disposition: A | Discharge status: Home / Self Care | Payer: Medicare Other | Source: Ambulatory Visit | Attending: Gynecologic Oncology | Admitting: Gynecologic Oncology

## 2018-06-21 DIAGNOSIS — R9389 Abnormal findings on diagnostic imaging of other specified body structures: Secondary | ICD-10-CM | POA: Diagnosis present

## 2018-06-22 ENCOUNTER — Telehealth: Payer: Self-pay | Admitting: Oncology

## 2018-06-22 NOTE — Telephone Encounter (Signed)
Sarah Colon at [facility] called and said that Dr. Gilford Rile has reviewed Sarah Colon's latest CT and she no longer needs any intervention.  They will cancel their part.

## 2018-06-26 ENCOUNTER — Ambulatory Visit: Payer: Medicare Other | Admitting: Physician Assistant

## 2018-06-27 ENCOUNTER — Ambulatory Visit: Payer: Medicare Other | Admitting: Gynecology

## 2018-06-27 ENCOUNTER — Inpatient Hospital Stay: Payer: Medicare Other | Attending: Gynecology | Admitting: Gynecology

## 2018-06-27 ENCOUNTER — Encounter: Payer: Self-pay | Admitting: Gynecology

## 2018-06-27 ENCOUNTER — Ambulatory Visit (HOSPITAL_COMMUNITY): Payer: Medicare Other

## 2018-06-27 VITALS — BP 130/72 | HR 91 | Temp 98.4°F | Resp 18 | Ht 60.0 in | Wt 150.1 lb

## 2018-06-27 DIAGNOSIS — N838 Other noninflammatory disorders of ovary, fallopian tube and broad ligament: Secondary | ICD-10-CM

## 2018-06-27 DIAGNOSIS — D3912 Neoplasm of uncertain behavior of left ovary: Secondary | ICD-10-CM | POA: Diagnosis present

## 2018-06-27 NOTE — Patient Instructions (Signed)
Follow up with your primary care provider.
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
## 2018-07-03 ENCOUNTER — Encounter (HOSPITAL_BASED_OUTPATIENT_CLINIC_OR_DEPARTMENT_OTHER): Payer: Medicare Other | Attending: Internal Medicine

## 2018-07-18 ENCOUNTER — Encounter: Payer: Medicare Other | Attending: Physician Assistant | Admitting: Physician Assistant

## 2018-07-18 DIAGNOSIS — I482 Chronic atrial fibrillation, unspecified: Secondary | ICD-10-CM | POA: Insufficient documentation

## 2018-07-18 DIAGNOSIS — E11622 Type 2 diabetes mellitus with other skin ulcer: Secondary | ICD-10-CM | POA: Diagnosis not present

## 2018-07-18 DIAGNOSIS — Z885 Allergy status to narcotic agent status: Secondary | ICD-10-CM | POA: Diagnosis not present

## 2018-07-18 DIAGNOSIS — L89153 Pressure ulcer of sacral region, stage 3: Secondary | ICD-10-CM | POA: Diagnosis not present

## 2018-07-18 DIAGNOSIS — H409 Unspecified glaucoma: Secondary | ICD-10-CM | POA: Diagnosis not present

## 2018-07-18 DIAGNOSIS — I110 Hypertensive heart disease with heart failure: Secondary | ICD-10-CM | POA: Diagnosis not present

## 2018-07-18 DIAGNOSIS — Z87891 Personal history of nicotine dependence: Secondary | ICD-10-CM | POA: Diagnosis not present

## 2018-07-18 DIAGNOSIS — I5042 Chronic combined systolic (congestive) and diastolic (congestive) heart failure: Secondary | ICD-10-CM | POA: Insufficient documentation

## 2018-07-20 NOTE — Progress Notes (Signed)
MIAMARIE, MOLL ([id number]) Visit Report for 07/18/2018 Allergy List Details Patient Name: Sarah Colon, Sarah Colon. Date of Service: 07/18/2018 10:15 AM Medical Record Number: [id number] Patient Account Number: [id number] Date of Birth/Sex: April 11, 1946 (72 y.o. F) Treating RN: Cornell Barman Primary Care Travian Kerner: Jules Husbands Other Clinician: Referring Kista Robb: Jules Husbands Treating Catera Hankins/Extender: Melburn Hake, HOYT Weeks in Treatment: 0 Allergies Active Allergies codeine Allergy Notes Electronic Signature(s) Signed: 07/19/2018 6:42:35 PM By: Gretta Cool, BSN, RN, CWS, Kim RN, BSN Entered By: Gretta Cool, BSN, RN, CWS, Kim on 07/18/2018 10:46:55 [name] ([id number]) -------------------------------------------------------------------------------- Arrival Information Details Patient Name: [name] Date of Service: 07/18/2018 10:15 AM Medical Record Number: [id number] Patient Account Number: [id number] Date of Birth/Sex: 11-22-1945 (72 y.o. F) Treating RN: Cornell Barman Primary Care Keyshia Orwick: Jules Husbands Other Clinician: Referring Jairy Angulo: Jules Husbands Treating Damico Partin/Extender: Melburn Hake, HOYT Weeks in Treatment: 0 Visit Information Patient Arrived: Wheel Chair Arrival Time: 10:37 Accompanied By: self Transfer Assistance: Harrel Lemon Lift Patient Identification Verified: Yes Secondary Verification Process Yes Completed: Patient Has Alerts: Yes Patient Alerts: Patient on Blood Thinner Xarelto Type II Diabetic Electronic Signature(s) Signed: 07/18/2018 12:49:13 PM By: Montey Hora Entered By: Montey Hora on 07/18/2018 12:49:13 Fogleman, [name] ([id number]) -------------------------------------------------------------------------------- Clinic Level of Care Assessment Details Patient Name: [name] Date of Service: 07/18/2018 10:15 AM Medical Record Number: [id number] Patient Account Number: [id number] Date of Birth/Sex: Oct 21, 1945 (72 y.o. F) Treating  RN: Montey Hora Primary Care Haifa Hatton: Jules Husbands Other Clinician: Referring Zimere Dunlevy: Jules Husbands Treating Alvah Gilder/Extender: Melburn Hake, HOYT Weeks in Treatment: 0 Clinic Level of Care Assessment Items TOOL 2 Quantity Score []  - Use when only an EandM is performed on the INITIAL visit 0 ASSESSMENTS - Nursing Assessment / Reassessment X - General Physical Exam (combine w/ comprehensive assessment (listed just below) when 1 20 performed on new pt. evals) X- 1 25 Comprehensive Assessment (HX, ROS, Risk Assessments, Wounds Hx, etc.) ASSESSMENTS - Wound and Skin Assessment / Reassessment X - Simple Wound Assessment / Reassessment - one wound 1 5 []  - 0 Complex Wound Assessment / Reassessment - multiple wounds []  - 0 Dermatologic / Skin Assessment (not related to wound area) ASSESSMENTS - Ostomy and/or Continence Assessment and Care X - Incontinence Assessment and Management 1 10 []  - 0 Ostomy Care Assessment and Management (repouching, etc.) PROCESS - Coordination of Care X - Simple Patient / Family Education for ongoing care 1 15 []  - 0 Complex (extensive) Patient / Family Education for ongoing care X- 1 10 Staff obtains Programmer, systems, Records, Test Results / Process Orders []  - 0 Staff telephones HHA, Nursing Homes / Clarify orders / etc []  - 0 Routine Transfer to another Facility (non-emergent condition) []  - 0 Routine Hospital Admission (non-emergent condition) []  - 0 New Admissions / Biomedical engineer / Ordering NPWT, Apligraf, etc. []  - 0 Emergency Hospital Admission (emergent condition) X- 1 10 Simple Discharge Coordination []  - 0 Complex (extensive) Discharge Coordination PROCESS - Special Needs []  - Pediatric / Minor Patient Management 0 []  - 0 Isolation Patient Management Krukowski, Domingue D. ([id number]) []  - 0 Hearing / Language / Visual special needs []  - 0 Assessment of Community assistance (transportation, D/C planning, etc.) []  -  0 Additional assistance / Altered mentation []  - 0 Support Surface(s) Assessment (bed, cushion, seat, etc.) INTERVENTIONS - Wound Cleansing / Measurement X - Wound Imaging (photographs - any number of wounds) 1 5 []  - 0 Wound Tracing (instead of photographs) X- 1 5 Simple Wound Measurement - one wound []  -  0 Complex Wound Measurement - multiple wounds X- 1 5 Simple Wound Cleansing - one wound []  - 0 Complex Wound Cleansing - multiple wounds INTERVENTIONS - Wound Dressings X - Small Wound Dressing one or multiple wounds 1 10 []  - 0 Medium Wound Dressing one or multiple wounds []  - 0 Large Wound Dressing one or multiple wounds []  - 0 Application of Medications - injection INTERVENTIONS - Miscellaneous []  - External ear exam 0 []  - 0 Specimen Collection (cultures, biopsies, blood, body fluids, etc.) []  - 0 Specimen(s) / Culture(s) sent or taken to Lab for analysis X- 1 10 Patient Transfer (multiple staff / Civil Service fast streamer / Similar devices) []  - 0 Simple Staple / Suture removal (25 or less) []  - 0 Complex Staple / Suture removal (26 or more) []  - 0 Hypo / Hyperglycemic Management (close monitor of Blood Glucose) []  - 0 Ankle / Brachial Index (ABI) - do not check if billed separately Has the patient been seen at the hospital within the last three years: Yes Total Score: 130 Level Of Care: New/Established - Level 4 Electronic Signature(s) Signed: 07/18/2018 4:18:49 PM By: Montey Hora Entered By: Montey Hora on 07/18/2018 11:26:34 Maimone, [name] ([id number]) -------------------------------------------------------------------------------- Encounter Discharge Information Details Patient Name: [name] Date of Service: 07/18/2018 10:15 AM Medical Record Number: [id number] Patient Account Number: [id number] Date of Birth/Sex: May 08, 1946 (72 y.o. F) Treating RN: Montey Hora Primary Care Kandie Keiper: Jules Husbands Other Clinician: Referring Yamile Roedl: Jules Husbands Treating Niko Penson/Extender: Melburn Hake, HOYT Weeks in Treatment: 0 Encounter Discharge Information Items Discharge Condition: Stable Ambulatory Status: Wheelchair Discharge Destination: Skilled Nursing Facility Telephoned: No Orders Sent: Yes Transportation: Private Auto Accompanied By: self Schedule Follow-up Appointment: Yes Clinical Summary of Care: Electronic Signature(s) Signed: 07/18/2018 4:18:49 PM By: Montey Hora Entered By: Montey Hora on 07/18/2018 11:27:13 Worthey, [name] ([id number]) -------------------------------------------------------------------------------- Lower Extremity Assessment Details Patient Name: [name] Date of Service: 07/18/2018 10:15 AM Medical Record Number: [id number] Patient Account Number: [id number] Date of Birth/Sex: 1945-10-02 (72 y.o. F) Treating RN: Cornell Barman Primary Care Icesis Renn: Jules Husbands Other Clinician: Referring Britain Saber: Jules Husbands Treating Errick Salts/Extender: Sharalyn Ink in Treatment: 0 Electronic Signature(s) Signed: 07/19/2018 6:42:35 PM By: Gretta Cool, BSN, RN, CWS, Kim RN, BSN Entered By: Gretta Cool, BSN, RN, CWS, Kim on 07/18/2018 11:02:33 [name] ([id number]) -------------------------------------------------------------------------------- Multi Wound Chart Details Patient Name: [name] Date of Service: 07/18/2018 10:15 AM Medical Record Number: [id number] Patient Account Number: [id number] Date of Birth/Sex: 1946-04-24 (72 y.o. F) Treating RN: Montey Hora Primary Care Conleigh Heinlein: Jules Husbands Other Clinician: Referring Inas Avena: Jules Husbands Treating Shaquila Sigman/Extender: Melburn Hake, HOYT Weeks in Treatment: 0 Vital Signs Height(in): 60 Pulse(bpm): 101 Weight(lbs): 151.2 Blood Pressure(mmHg): 213/91 Body Mass Index(BMI): 30 Temperature(F): 98.7 Respiratory Rate 16 (breaths/min): Photos: [1:No Photos] [N/A:N/A] Wound Location: [1:Sacrum - Midline]  [N/A:N/A] Wounding Event: [1:Pressure Injury] [N/A:N/A] Primary Etiology: [1:Pressure Ulcer] [N/A:N/A] Comorbid History: [1:Cataracts, Glaucoma, Arrhythmia, Congestive Heart Failure, Hypertension, Type II Diabetes, History of pressure wounds] [N/A:N/A] Date Acquired: [1:05/31/2017] [N/A:N/A] Weeks of Treatment: [1:0] [N/A:N/A] Wound Status: [1:Open] [N/A:N/A] Measurements L x W x D [1:1x0.6x0.5] [N/A:N/A] (cm) Area (cm) : [1:0.471] [N/A:N/A] Volume (cm) : [1:0.236] [N/A:N/A] % Reduction in Area: [1:0.00%] [N/A:N/A] % Reduction in Volume: [1:0.00%] [N/A:N/A] Classification: [1:Category/Stage III] [N/A:N/A] Exudate Amount: [1:Medium] [N/A:N/A] Exudate Type: [1:Purulent] [N/A:N/A] Exudate Color: [1:yellow, brown, green] [N/A:N/A] Wound Margin: [1:Thickened] [N/A:N/A] Granulation Amount: [1:Medium (34-66%)] [N/A:N/A] Granulation Quality: [1:Pink] [N/A:N/A] Necrotic Amount: [1:Medium (34-66%)] [N/A:N/A] Exposed Structures: [1:Fat Layer (Subcutaneous Tissue)  Exposed: Yes Fascia: No Tendon: No Muscle: No Joint: No Bone: No] [N/A:N/A] Epithelialization: [1:None] [N/A:N/A] Periwound Skin Texture: [1:Scarring: Yes Excoriation: No Induration: No] [N/A:N/A] Callus: No Crepitus: No Rash: No Periwound Skin Moisture: Maceration: Yes N/A N/A Dry/Scaly: No Periwound Skin Color: Erythema: Yes N/A N/A Atrophie Blanche: No Cyanosis: No Ecchymosis: No Hemosiderin Staining: No Mottled: No Pallor: No Rubor: No Erythema Location: Circumferential N/A N/A Temperature: No Abnormality N/A N/A Tenderness on Palpation: Yes N/A N/A Wound Preparation: Ulcer Cleansing: N/A N/A Rinsed/Irrigated with Saline Topical Anesthetic Applied: Other: lidocaine 4% Treatment Notes Electronic Signature(s) Signed: 07/18/2018 4:18:49 PM By: Montey Hora Entered By: Montey Hora on 07/18/2018 11:23:33 Ashraf, [name]  ([id number]) -------------------------------------------------------------------------------- [place] Details Patient Name: [name] Date of Service: 07/18/2018 10:15 AM Medical Record Number: [id number] Patient Account Number: [id number] Date of Birth/Sex: 12-08-1945 (72 y.o. F) Treating RN: Montey Hora Primary Care Mackinley Kiehn: Jules Husbands Other Clinician: Referring Kiley Torrence: Jules Husbands Treating Kaniya Trueheart/Extender: Melburn Hake, HOYT Weeks in Treatment: 0 Active Inactive Abuse / Safety / Falls / Self Care Management Nursing Diagnoses: Impaired physical mobility Goals: Patient will not develop complications from immobility Date Initiated: 07/18/2018 Target Resolution Date: 10/07/2018 Goal Status: Active Interventions: Assess fall risk on admission and as needed Notes: Nutrition Nursing Diagnoses: Potential for alteratiion in Nutrition/Potential for imbalanced nutrition Goals: Patient/caregiver agrees to and verbalizes understanding of need to use nutritional supplements and/or vitamins as prescribed Date Initiated: 07/18/2018 Target Resolution Date: 10/07/2018 Goal Status: Active Interventions: Assess patient nutrition upon admission and as needed per policy Notes: Orientation to the Wound Care Program Nursing Diagnoses: Knowledge deficit related to the wound healing center program Goals: Patient/caregiver will verbalize understanding of the [facility] Program Date Initiated: 07/18/2018 Target Resolution Date: 10/07/2018 Goal Status: Active Interventions: Provide education on orientation to the wound center Vanvranken, Lanasia D. ([id number]) Notes: Pressure Nursing Diagnoses: Knowledge deficit related to causes and risk factors for pressure ulcer development Goals: Patient will remain free from development of additional pressure ulcers Date Initiated: 07/18/2018 Target Resolution Date: 10/07/2018 Goal Status:  Active Interventions: Assess offloading mechanisms upon admission and as needed Provide education on pressure ulcers Notes: Wound/Skin Impairment Nursing Diagnoses: Impaired tissue integrity Goals: Ulcer/skin breakdown will heal within 14 weeks Date Initiated: 07/18/2018 Target Resolution Date: 10/07/2018 Goal Status: Active Interventions: Assess patient/caregiver ability to obtain necessary supplies Assess patient/caregiver ability to perform ulcer/skin care regimen upon admission and as needed Assess ulceration(s) every visit Notes: Electronic Signature(s) Signed: 07/18/2018 4:18:49 PM By: Montey Hora Entered By: Montey Hora on 07/18/2018 11:20:17 Cuthbert, Yilin D. ([id number]) -------------------------------------------------------------------------------- Pain Assessment Details Patient Name: [name] Date of Service: 07/18/2018 10:15 AM Medical Record Number: [id number] Patient Account Number: [id number] Date of Birth/Sex: [birth year] (72 y.o. F) Treating RN: Cornell Barman Primary Care Gleen Ripberger: Jules Husbands Other Clinician: Referring Kashlynn Kundert: Jules Husbands Treating Coreen Shippee/Extender: Melburn Hake, HOYT Weeks in Treatment: 0 Active Problems Location of Pain Severity and Description of Pain Patient Has Paino No Site Locations Pain Management and Medication Current Pain Management: Goals for Pain Management Patient denies any pain at this time. Electronic Signature(s) Signed: 07/19/2018 6:42:35 PM By: Gretta Cool, BSN, RN, CWS, Kim RN, BSN Entered By: Gretta Cool, BSN, RN, CWS, Kim on 07/18/2018 10:43:58 [name] ([id number]) -------------------------------------------------------------------------------- Patient/Caregiver Education Details Patient Name: [name] Date of Service: 07/18/2018 10:15 AM Medical Record Number: [id number] Patient Account Number: [id number] Date of Birth/Gender: 1945/10/18 (73 y.o. F) Treating RN: Montey Hora Primary Care  Physician: Jules Husbands Other Clinician:  Referring Physician: Jules Husbands Treating Physician/Extender: Sharalyn Ink in Treatment: 0 Education Assessment Education Provided To: Caregiver SNF nurses Education Topics Provided Wound/Skin Impairment: Handouts: Other: signed wound care orders Methods: Printed Electronic Signature(s) Signed: 07/18/2018 4:18:49 PM By: Montey Hora Entered By: Montey Hora on 07/18/2018 11:27:39 Bielby, [name] ([id number]) -------------------------------------------------------------------------------- Wound Assessment Details Patient Name: [name] Date of Service: 07/18/2018 10:15 AM Medical Record Number: [id number] Patient Account Number: [id number] Date of Birth/Sex: 05-Jul-1945 (72 y.o. F) Treating RN: Cornell Barman Primary Care Iraida Cragin: Jules Husbands Other Clinician: Referring Shaida Route: Jules Husbands Treating Valarie Farace/Extender: Melburn Hake, HOYT Weeks in Treatment: 0 Wound Status Wound Number: 1 Primary Pressure Ulcer Etiology: Wound Location: Sacrum - Midline Wound Open Wounding Event: Pressure Injury Status: Date Acquired: 05/31/2017 Comorbid Cataracts, Glaucoma, Arrhythmia, Congestive Weeks Of Treatment: 0 History: Heart Failure, Hypertension, Type II Diabetes, Clustered Wound: No History of pressure wounds Photos Photo Uploaded By: Army Melia on 07/18/2018 11:25:41 Wound Measurements Length: (cm) 1 Width: (cm) 0.6 Depth: (cm) 0.5 Area: (cm) 0.471 Volume: (cm) 0.236 % Reduction in Area: 0% % Reduction in Volume: 0% Epithelialization: None Tunneling: No Undermining: No Wound Description Classification: Category/Stage III Foul Odo Wound Margin: Thickened Slough/F Exudate Amount: Medium Exudate Type: Purulent Exudate Color: yellow, brown, green r After Cleansing: No ibrino No Wound Bed Granulation Amount: Medium (34-66%) Exposed Structure Granulation Quality: Pink Fascia Exposed:  No Necrotic Amount: Medium (34-66%) Fat Layer (Subcutaneous Tissue) Exposed: Yes Necrotic Quality: Adherent Slough Tendon Exposed: No Muscle Exposed: No Joint Exposed: No Bone Exposed: No Periwound Skin Texture Monterosso, Kimbley D. ([id number]) Texture Color No Abnormalities Noted: No No Abnormalities Noted: No Callus: No Atrophie Blanche: No Crepitus: No Cyanosis: No Excoriation: No Ecchymosis: No Induration: No Erythema: Yes Rash: No Erythema Location: Circumferential Scarring: Yes Hemosiderin Staining: No Mottled: No Moisture Pallor: No No Abnormalities Noted: No Rubor: No Dry / Scaly: No Maceration: Yes Temperature / Pain Temperature: No Abnormality Tenderness on Palpation: Yes Wound Preparation Ulcer Cleansing: Rinsed/Irrigated with Saline Topical Anesthetic Applied: Other: lidocaine 4%, Treatment Notes Wound #1 (Midline Sacrum) Notes prisma and bordered foam dressing Electronic Signature(s) Signed: 07/19/2018 6:42:35 PM By: Gretta Cool, BSN, RN, CWS, Kim RN, BSN Entered By: Gretta Cool, BSN, RN, CWS, Kim on 07/18/2018 11:07:17 Bloodworth, [name] ([id number]) -------------------------------------------------------------------------------- Vitals Details Patient Name: [name] Date of Service: 07/18/2018 10:15 AM Medical Record Number: [id number] Patient Account Number: [id number] Date of Birth/Sex: 01/10/1946 (72 y.o. F) Treating RN: Cornell Barman Primary Care Greidys Deland: Jules Husbands Other Clinician: Referring Naomii Kreger: Jules Husbands Treating Liora Myles/Extender: Melburn Hake, HOYT Weeks in Treatment: 0 Vital Signs Time Taken: 09:04 Temperature (F): 98.7 Height (in): 60 Pulse (bpm): 101 Weight (lbs): 151.2 Respiratory Rate (breaths/min): 16 Body Mass Index (BMI): 29.5 Blood Pressure (mmHg): 213/91 Reference Range: 80 - 120 mg / dl Airway Pulse Oximetry (%): 96 Notes Blood pressure at Discharge 192/77 @1130 . Patient stated she didn't receive her  medication this morning. She was woken late for her appointment. Electronic Signature(s) Signed: 07/19/2018 8:37:22 AM By: Harold Barban Entered By: Harold Barban on 07/18/2018 11:53:23

## 2018-07-20 NOTE — Progress Notes (Signed)
DANAE, OLAND ([id number]) Visit Report for 07/18/2018 Chief Complaint Document Details Patient Name: Sarah Colon, Sarah Colon. Date of Service: 07/18/2018 10:15 AM Medical Record Number: [id number] Patient Account Number: [id number] Date of Birth/Sex: 1946/04/13 (73 y.o. F) Treating RN: Montey Hora Primary Care Provider: Jules Husbands Other Clinician: Referring Provider: Jules Husbands Treating Provider/Extender: Melburn Hake, Otha Rickles Weeks in Treatment: 0 Information Obtained from: Patient Chief Complaint Sacral Pressure ulcer Electronic Signature(s) Signed: 07/18/2018 5:20:18 PM By: Worthy Keeler PA-C Entered By: Worthy Keeler on 07/18/2018 11:14:59 Starlin, Clovia D. ([id number]) -------------------------------------------------------------------------------- HPI Details Patient Name: Sarah Reach D. Date of Service: 07/18/2018 10:15 AM Medical Record Number: [id number] Patient Account Number: [id number] Date of Birth/Sex: [birth year] (73 y.o. 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 She states that this initially occurred about a year ago closed and then reopened several weeks ago though she does not have a specific timeframe for this. Unfortunately she states there is some discomfort at this time. She does have a history of atrial fibrillation, congestive heart failure, hypertension, diabetes mellitus type II, and her most recent hemoglobin A1c was 6.8 on 05/30/18. She did have a CT scan of the sacral region on 06/19/18 this was negative for osteomyelitis which is excellent news. Patient was on doxycycline two weeks ago and it appears based on record review that she also is  currently on Cipro. There is some question about whether not she has a urinary tract infection. She did have a CBC performed which showed a white blood cell count at 20 which again indicates some kind of infection in my pinion. Again upon inspection today I seriously doubt that this is an infection secondary to the wound. I seen no signs of infection here. No fevers, chills, nausea, or vomiting noted at this time. The patient in fact states she feels really well. This again I feel like leads credence to the fact that she may have a urinary tract infection. Electronic Signature(s) Signed: 07/18/2018 5:20:18 PM By: Worthy Keeler PA-C Entered By: Worthy Keeler on 07/18/2018 17:12:53 Upadhyay, [name] ([id number]) -------------------------------------------------------------------------------- Physical Exam Details Patient Name: Sarah Reach D. Date of Service: 07/18/2018 10:15 AM Medical Record Number: [id number] Patient Account Number: [id number] Date of Birth/Sex: 08/27/1945 (73 y.o. F) Treating RN: Montey Hora Primary Care Provider: Jules Husbands Other Clinician: Referring Provider: Jules Husbands Treating Provider/Extender: Melburn Hake, Kameko Hukill Weeks in Treatment: 0 Constitutional patient is hypertensive.. pulse regular and within target range for patient.Marland Kitchen respirations regular, non-labored and within target range for patient.Marland Kitchen temperature within target range for patient.. Well-nourished and well-hydrated in no acute distress. Eyes conjunctiva clear no eyelid edema noted. pupils equal round and reactive to light and accommodation. Ears, Nose, Mouth, and Throat no gross abnormality of ear auricles or external auditory canals. normal hearing noted during conversation. mucus membranes moist. Respiratory normal breathing without difficulty. clear to auscultation bilaterally. Cardiovascular regular rate and rhythm with normal S1, S2. no clubbing, cyanosis, significant edema, <3 sec  cap refill. Gastrointestinal (GI) soft, non-tender, non-distended, +BS. no ventral hernia noted. Musculoskeletal Patient unable to walk without assistance. Psychiatric this patient is able to make decisions and demonstrates good insight into disease process. Alert and Oriented x 3. pleasant and cooperative. Notes Patient's wound bed currently  shows actually excellent granulation there was no significant slough buildup which is good news. There is no bone or structure exposed within the wound bed other than subcutaneous tissue at this point which is also good news. Again this appears to be stage III pressure ulcer. Fortunately it is not too deep and again there is no evidence of there being any infection at this time. Electronic Signature(s) Signed: 07/18/2018 5:20:18 PM By: Worthy Keeler PA-C Entered By: Worthy Keeler on 07/18/2018 17:13:42 Sease, [name] ([id number]) -------------------------------------------------------------------------------- Physician Orders Details Patient Name: Sarah Reach D. Date of Service: 07/18/2018 10:15 AM Medical Record Number: [id number] Patient Account Number: [id number] Date of Birth/Sex: 09-11-1945 (73 y.o. F) Treating RN: Montey Hora Primary Care Provider: Jules Husbands Other Clinician: Referring Provider: Jules Husbands Treating Provider/Extender: Melburn Hake, Donnalyn Juran Weeks in Treatment: 0 Verbal / Phone Orders: No Diagnosis Coding ICD-10 Coding Code Description L89.153 Pressure ulcer of sacral region, stage 3 I10 Essential (primary) hypertension E11.622 Type 2 diabetes mellitus with other skin ulcer I48.20 Chronic atrial fibrillation, unspecified I50.42 Chronic combined systolic (congestive) and diastolic (congestive) heart failure Wound Cleansing Wound #1 Midline Sacrum o Clean wound with Normal Saline. Skin Barriers/Peri-Wound Care Wound #1 Midline Sacrum o Skin Prep Primary Wound Dressing Wound #1 Midline Sacrum o  Silver Collagen Secondary Dressing Wound #1 Midline Sacrum o Boardered Foam Dressing Dressing Change Frequency Wound #1 Midline Sacrum o Change dressing every day. - AND AS NEEDED FOR SOILAGE - SNF NURSES TO CHECK DRESSING INTEGRITY EVERY SHIFT Follow-up Appointments Wound #1 Midline Sacrum o Return Appointment in 2 weeks. Off-Loading Wound #1 Midline Sacrum o Roho cushion for wheelchair - SNF TO PROVIDE ROHO CUHION FOR PATIENT'S [facility] Electronic Signature(s) Signed: 07/18/2018 4:18:49 PM By: Carles Collet ([id number]) Signed: 07/18/2018 5:20:18 PM By: Worthy Keeler PA-C Entered By: Montey Hora on 07/18/2018 11:25:52 Taite, Naira DMarland Kitchen ([id number]) -------------------------------------------------------------------------------- Problem List Details Patient Name: Sarah Reach D. Date of Service: 07/18/2018 10:15 AM Medical Record Number: [id number] Patient Account Number: [id number] Date of Birth/Sex: [birth year] (73 y.o. F) Treating RN: Montey Hora Primary Care Provider: Jules Husbands Other Clinician: Referring Provider: Jules Husbands Treating Provider/Extender: Melburn Hake, Shadrick Senne Weeks in Treatment: 0 Active Problems ICD-10 Evaluated Encounter Code Description Active Date Today Diagnosis L89.153 Pressure ulcer of sacral region, stage 3 07/18/2018 No Yes I10 Essential (primary) hypertension 07/18/2018 No Yes E11.622 Type 2 diabetes mellitus with other skin ulcer 07/18/2018 No Yes I48.20 Chronic atrial fibrillation, unspecified 07/18/2018 No Yes I50.42 Chronic combined systolic (congestive) and diastolic 6/44/0347 No Yes (congestive) heart failure Inactive Problems Resolved Problems Electronic Signature(s) Signed: 07/18/2018 5:20:18 PM By: Worthy Keeler PA-C Entered By: Worthy Keeler on 07/18/2018 11:14:39 Raz, Laurelin D. ([id number]) -------------------------------------------------------------------------------- Progress Note  Details Patient Name: Sarah Reach D. Date of Service: 07/18/2018 10:15 AM Medical Record Number: [id number] Patient Account Number: [id number] Date of Birth/Sex: 1946-01-25 (73 y.o. F) Treating RN: Montey Hora Primary Care Provider: Jules Husbands Other Clinician: Referring Provider: Jules Husbands Treating Provider/Extender: Melburn Hake, Kaneisha Ellenberger Weeks in Treatment: 0 Subjective Chief Complaint Information obtained from Patient Sacral Pressure ulcer History of Present Illness (HPI) 07/18/18 on evaluation today patient presents with a pressure ulcer to the sacral region which is a stage III ulcer. She states that this initially occurred about a year ago closed and then reopened several weeks ago though she does not have a specific timeframe for this. Unfortunately she states there is some discomfort at this time. She does have a history of  atrial fibrillation, congestive heart failure, hypertension, diabetes mellitus type II, and her most recent hemoglobin A1c was 6.8 on 05/30/18. She did have a CT scan of the sacral region on 06/19/18 this was negative for osteomyelitis which is excellent news. Patient was on doxycycline two weeks ago and it appears based on record review that she also is currently on Cipro. There is some question about whether not she has a urinary tract infection. She did have a CBC performed which showed a white blood cell count at 20 which again indicates some kind of infection in my pinion. Again upon inspection today I seriously doubt that this is an infection secondary to the wound. I seen no signs of infection here. No fevers, chills, nausea, or vomiting noted at this time. The patient in fact states she feels really well. This again I feel like leads credence to the fact that she may have a urinary tract infection. Wound History Patient presents with 1 open wound that has been present for approximately 1 year. The wound has been healed in the past but has  re-opened. Laboratory tests have been performed in the last month. Patient reportedly has tested positive for an antibiotic resistant organism. Patient reportedly has not tested positive for osteomyelitis. Patient History Information obtained from Patient, Chart. Allergies codeine Family History No family history of Cancer, Diabetes, Heart Disease, Hypertension, Kidney Disease, Lung Disease, Seizures, Stroke, Thyroid Problems, Tuberculosis. Social History Former smoker - 67 hears ago, Marital Status - Widowed, Alcohol Use - Never, Drug Use - No History, Caffeine Use - Moderate. Medical History Eyes Patient has history of Cataracts - removed, Glaucoma Denies history of Optic Neuritis Ear/Nose/Mouth/Throat Denies history of Chronic sinus problems/congestion, Middle ear problems Hematologic/Lymphatic Denies history of Anemia, Hemophilia, Human Immunodeficiency Virus, Lymphedema, Sickle Cell Disease Ligman, Naia D. ([id number]) Respiratory Denies history of Aspiration, Asthma, Chronic Obstructive Pulmonary Disease (COPD), Pneumothorax, Sleep Apnea, Tuberculosis Cardiovascular Patient has history of Arrhythmia, Congestive Heart Failure, Hypertension Denies history of Angina, Coronary Artery Disease, Deep Vein Thrombosis, Hypotension, Myocardial Infarction, Peripheral Arterial Disease, Peripheral Venous Disease, Phlebitis, Vasculitis Gastrointestinal Denies history of Cirrhosis , Colitis, Crohn s, Hepatitis A, Hepatitis B, Hepatitis C Endocrine Patient has history of Type II Diabetes Denies history of Type I Diabetes Genitourinary Denies history of End Stage Renal Disease Immunological Denies history of Lupus Erythematosus, Raynaud s, Scleroderma Integumentary (Skin) Patient has history of History of pressure wounds Denies history of History of Burn Musculoskeletal Denies history of Gout, Rheumatoid Arthritis, Osteoarthritis, Osteomyelitis Neurologic Denies history of Dementia,  Neuropathy, Quadriplegia, Paraplegia, Seizure Disorder Oncologic Denies history of Received Chemotherapy, Received Radiation Psychiatric Denies history of Anorexia/bulimia, Confinement Anxiety Blood sugar is tested. Hospitalization/Surgery History - [facility]/19/2018, [name], Review of Systems (ROS) Constitutional Symptoms ([place]) The patient has no complaints or symptoms. Eyes Complains or has symptoms of Vision Changes. Ear/Nose/Mouth/Throat The patient has no complaints or symptoms. Hematologic/Lymphatic The patient has no complaints or symptoms. Respiratory The patient has no complaints or symptoms. Cardiovascular Complains or has symptoms of LE edema. Denies complaints or symptoms of Chest pain. Gastrointestinal The patient has no complaints or symptoms. Endocrine The patient has no complaints or symptoms. Genitourinary Complains or has symptoms of Incontinence/dribbling. Denies complaints or symptoms of Kidney failure/ Dialysis. Immunological The patient has no complaints or symptoms. Integumentary (Skin) Complains or has symptoms of Wounds. Denies complaints or symptoms of Bleeding or bruising tendency, Breakdown, Swelling. Musculoskeletal The patient has no complaints or symptoms. XIA, STOHR ([id number]) Neurologic The patient  has no complaints or symptoms. Oncologic The patient has no complaints or symptoms. Psychiatric The patient has no complaints or symptoms. General Notes: Patient state no to DNR, but PCP paperwork states yes Objective Constitutional patient is hypertensive.. pulse regular and within target range for patient.Marland Kitchen respirations regular, non-labored and within target range for patient.Marland Kitchen temperature within target range for patient.. Well-nourished and well-hydrated in no acute distress. Vitals Time Taken: 9:04 AM, Height: 60 in, Weight: 151.2 lbs, BMI: 29.5, Temperature: 98.7 F, Pulse: 101 bpm, Respiratory Rate: 16 breaths/min,  Blood Pressure: 213/91 mmHg, Pulse Oximetry: 96 %. General Notes: Blood pressure at Discharge 192/77 @1130 . Patient stated she didn't receive her medication this morning. She was woken late for her appointment. Eyes conjunctiva clear no eyelid edema noted. pupils equal round and reactive to light and accommodation. Ears, Nose, Mouth, and Throat no gross abnormality of ear auricles or external auditory canals. normal hearing noted during conversation. mucus membranes moist. Respiratory normal breathing without difficulty. clear to auscultation bilaterally. Cardiovascular regular rate and rhythm with normal S1, S2. no clubbing, cyanosis, significant edema, Gastrointestinal (GI) soft, non-tender, non-distended, +BS. no ventral hernia noted. Musculoskeletal Patient unable to walk without assistance. Psychiatric this patient is able to make decisions and demonstrates good insight into disease process. Alert and Oriented x 3. pleasant and cooperative. General Notes: Patient's wound bed currently shows actually excellent granulation there was no significant slough buildup which is good news. There is no bone or structure exposed within the wound bed other than subcutaneous tissue at this point which is also good news. Again this appears to be stage III pressure ulcer. Fortunately it is not too deep and again there is no evidence of there being any infection at this time. Integumentary (Hair, Skin) Wound #1 status is Open. Original cause of wound was Pressure Injury. The wound is located on the Midline Sacrum. The wound measures 1cm length x 0.6cm width x 0.5cm depth; 0.471cm^2 area and 0.236cm^3 volume. There is Fat Layer Dohmen, Modestine D. ([id number]) (Subcutaneous Tissue) Exposed exposed. There is no tunneling or undermining noted. There is a medium amount of purulent drainage noted. The wound margin is thickened. There is medium (34-66%) pink granulation within the wound bed. There is  a medium (34-66%) amount of necrotic tissue within the wound bed including Adherent Slough. The periwound skin appearance exhibited: Scarring, Maceration, Erythema. The periwound skin appearance did not exhibit: Callus, Crepitus, Excoriation, Induration, Rash, Dry/Scaly, Atrophie Blanche, Cyanosis, Ecchymosis, Hemosiderin Staining, Mottled, Pallor, Rubor. The surrounding wound skin color is noted with erythema which is circumferential. Periwound temperature was noted as No Abnormality. The periwound has tenderness on palpation. Assessment Active Problems ICD-10 Pressure ulcer of sacral region, stage 3 Essential (primary) hypertension Type 2 diabetes mellitus with other skin ulcer Chronic atrial fibrillation, unspecified Chronic combined systolic (congestive) and diastolic (congestive) heart failure Plan Wound Cleansing: Wound #1 Midline Sacrum: Clean wound with Normal Saline. Skin Barriers/Peri-Wound Care: Wound #1 Midline Sacrum: Skin Prep Primary Wound Dressing: Wound #1 Midline Sacrum: Silver Collagen Secondary Dressing: Wound #1 Midline Sacrum: Boardered Foam Dressing Dressing Change Frequency: Wound #1 Midline Sacrum: Change dressing every day. - AND AS NEEDED FOR SOILAGE - SNF NURSES TO CHECK DRESSING INTEGRITY EVERY SHIFT Follow-up Appointments: Wound #1 Midline Sacrum: Return Appointment in 2 weeks. Off-Loading: Wound #1 Midline Sacrum: Roho cushion for wheelchair - SNF TO [place] At this point my suggestion is gonna be that we go ahead and initiate treatment with the above wound  care measures for the next week. The patient is in agreement with the plan. We will subsequently see were things stand at follow-up in two weeks time. In regard to the white blood cell count again I really do not feel like this is due to the wound although it does make me Strausser, Naysa D. ([id number]) strongly suspect that UTI considering the patient has  no symptoms to indicate that she can feel sick she tells me that she feels perfectly fine. Again this is something that the facility does seem to be looking into and she is on antibiotics, Cipro, currently. We will see were things stand at follow-up. Please see above for specific wound care orders. We will see patient for re-evaluation in 2 week(s) here in the clinic. If anything worsens or changes patient will contact our office for additional recommendations. Electronic Signature(s) Signed: 07/18/2018 5:20:18 PM By: Worthy Keeler PA-C Entered By: Worthy Keeler on 07/18/2018 17:14:40 Boylen, [name] ([id number]) -------------------------------------------------------------------------------- ROS/PFSH Details Patient Name: Sarah Reach D. Date of Service: 07/18/2018 10:15 AM Medical Record Number: [id number] Patient Account Number: [id number] Date of Birth/Sex: Sep 14, 1945 (73 y.o. F) Treating RN: Cornell Barman Primary Care Provider: Jules Husbands Other Clinician: Referring Provider: Jules Husbands Treating Provider/Extender: Melburn Hake, Vickie Melnik Weeks in Treatment: 0 Information Obtained From Patient Chart Wound History Do you currently have one or more open woundso Yes How many open wounds do you currently haveo 1 Approximately how long have you had your woundso 1 year Has your wound(s) ever healed and then re-openedo Yes Have you had any lab work done in the past montho Yes Have you tested positive for osteomyelitis (bone infection)o No Eyes Complaints and Symptoms: Positive for: Vision Changes Medical History: Positive for: Cataracts - removed; Glaucoma Negative for: Optic Neuritis Cardiovascular Complaints and Symptoms: Positive for: LE edema Negative for: Chest pain Medical History: Positive for: Arrhythmia; Congestive Heart Failure; Hypertension Negative for: Angina; Coronary Artery Disease; Deep Vein Thrombosis; Hypotension; Myocardial Infarction; Peripheral Arterial  Disease; Peripheral Venous Disease; Phlebitis; Vasculitis Genitourinary Complaints and Symptoms: Positive for: Incontinence/dribbling Negative for: Kidney failure/ Dialysis Medical History: Negative for: End Stage Renal Disease Integumentary (Skin) Complaints and Symptoms: Positive for: Wounds Negative for: Bleeding or bruising tendency; Breakdown; Swelling Medical History: Positive for: History of pressure wounds Negative for: History of Burn Walder, Kristene D. ([id number]) Constitutional Symptoms (General Health) Complaints and Symptoms: No Complaints or Symptoms Ear/Nose/Mouth/Throat Complaints and Symptoms: No Complaints or Symptoms Medical History: Negative for: Chronic sinus problems/congestion; Middle ear problems Hematologic/Lymphatic Complaints and Symptoms: No Complaints or Symptoms Medical History: Negative for: Anemia; Hemophilia; Human Immunodeficiency Virus; Lymphedema; Sickle Cell Disease Respiratory Complaints and Symptoms: No Complaints or Symptoms Medical History: Negative for: Aspiration; Asthma; Chronic Obstructive Pulmonary Disease (COPD); Pneumothorax; Sleep Apnea; Tuberculosis Gastrointestinal Complaints and Symptoms: No Complaints or Symptoms Medical History: Negative for: Cirrhosis ; Colitis; Crohnos; Hepatitis A; Hepatitis B; Hepatitis C Endocrine Complaints and Symptoms: No Complaints or Symptoms Medical History: Positive for: Type II Diabetes Negative for: Type I Diabetes Time with diabetes: 45years Blood sugar tested every day: Yes Tested : Immunological Complaints and Symptoms: No Complaints or Symptoms Medical History: Negative for: Lupus Erythematosus; Raynaudos; Scleroderma Musculoskeletal Dais, SINDY MCCUNE. ([id number]) Complaints and Symptoms: No Complaints or Symptoms Medical History: Negative for: Gout; Rheumatoid Arthritis; Osteoarthritis; Osteomyelitis Neurologic Complaints and Symptoms: No Complaints or  Symptoms Medical History: Negative for: Dementia; Neuropathy; Quadriplegia; Paraplegia; Seizure Disorder Oncologic Complaints and Symptoms: No Complaints or Symptoms Medical History: Negative for: Received Chemotherapy; Received  Radiation Psychiatric Complaints and Symptoms: No Complaints or Symptoms Medical History: Negative for: Anorexia/bulimia; Confinement Anxiety HBO Extended History Items Eyes: Eyes: Cataracts Glaucoma Immunizations Pneumococcal Vaccine: Received Pneumococcal Vaccination: Yes Implantable Devices Hospitalization / Surgery History Name of Hospital Purpose of Hospitalization/[facility] Date [name] Family and Social History Cancer: No; Diabetes: No; Heart Disease: No; Hypertension: No; Kidney Disease: No; Lung Disease: No; Seizures: No; Stroke: No; Thyroid Problems: No; Tuberculosis: No; Former smoker - 56 hears ago; Marital Status - Widowed; Alcohol Use: Never; Drug Use: No History; Caffeine Use: Moderate; Do not resuscitate: No; Living Will: Yes; Medical Power of Attorney: No Notes Patient state no to DNR, but PCP paperwork states yes Electronic Signature(s) Signed: 07/18/2018 5:20:18 PM By: Worthy Keeler PA-C Signed: 07/19/2018 6:42:35 PM By: Gretta Cool, BSN, RN, CWS, Kim RN, BSN Entered By: Gretta Cool, BSN, RN, CWS, Kim on 07/18/2018 10:58:30 GERA, INBODEN ([id number]) Salts, [name] ([id number]) -------------------------------------------------------------------------------- SuperBill Details Patient Name: Sarah Reach D. Date of Service: 07/18/2018 Medical Record Number: [id number] Patient Account Number: [id number] Date of Birth/Sex: [birth year] (73 y.o. F) Treating RN: Montey Hora Primary Care Provider: Jules Husbands Other Clinician: Referring Provider: Jules Husbands Treating Provider/Extender: Melburn Hake, Lavonne Cass Weeks in Treatment: 0 Diagnosis Coding ICD-10 Codes Code Description L89.153 Pressure ulcer of sacral region, stage  3 I10 Essential (primary) hypertension E11.622 Type 2 diabetes mellitus with other skin ulcer I48.20 Chronic atrial fibrillation, unspecified I50.42 Chronic combined systolic (congestive) and diastolic (congestive) heart failure Facility Procedures CPT4 Code: 85027741 Description: 99214 - WOUND CARE VISIT-LEV 4 EST PT Modifier: Quantity: 1 Physician Procedures CPT4 Code: [id number] Description: 72094 - WC PHYS LEVEL 4 - NEW PT ICD-10 Diagnosis Description L89.153 Pressure ulcer of sacral region, stage 3 E11.622 Type 2 diabetes mellitus with other skin ulcer I10 Essential (primary) hypertension I48.20 Chronic atrial fibrillation,  unspecified Modifier: Quantity: 1 Electronic Signature(s) Signed: 07/18/2018 5:20:18 PM By: Worthy Keeler PA-C Entered By: Worthy Keeler on 07/18/2018 17:14:57

## 2018-07-20 NOTE — Progress Notes (Signed)
ZULA, HOVSEPIAN ([id number]) Visit Report for 07/18/2018 Abuse/Suicide Risk Screen Details Patient Name: Sarah Colon, Sarah Colon. Date of Service: 07/18/2018 10:15 AM Medical Record Number: [id number] Patient Account Number: [id number] Date of Birth/Sex: 06-13-1945 (73 y.o. F) Treating RN: Cornell Barman Primary Care Manaia Samad: Jules Husbands Other Clinician: Referring Jessey Huyett: Jules Husbands Treating Kanyon Bunn/Extender: Melburn Hake, HOYT Weeks in Treatment: 0 Abuse/Suicide Risk Screen Items Answer ABUSE/SUICIDE RISK SCREEN: Has anyone close to you tried to hurt or harm you recentlyo No Do you feel uncomfortable with anyone in your familyo No Has anyone forced you do things that you didnot want to doo No Do you have any thoughts of harming yourselfo No Patient displays signs or symptoms of abuse and/or neglect. No Electronic Signature(s) Signed: 07/19/2018 6:42:35 PM By: Gretta Cool, BSN, RN, CWS, Kim RN, BSN Entered By: Gretta Cool, BSN, RN, CWS, Kim on 07/18/2018 10:59:34 [name] ([id number]) -------------------------------------------------------------------------------- Activities of Daily Living Details Patient Name: Sarah Colon, [name] Date of Service: 07/18/2018 10:15 AM Medical Record Number: [id number] Patient Account Number: [id number] Date of Birth/Sex: March 20, 1946 (73 y.o. F) Treating RN: Cornell Barman Primary Care Ailish Prospero: Jules Husbands Other Clinician: Referring Kynzleigh Bandel: Jules Husbands Treating Adylynn Hertenstein/Extender: Melburn Hake, HOYT Weeks in Treatment: 0 Activities of Daily Living Items Answer Activities of Daily Living (Please select one for each item) Drive Automobile Not Able Take Medications Need Assistance Use Telephone Need Assistance Care for Appearance Need Assistance Use Toilet Need Assistance Bath / Shower Need Assistance Dress Self Need Assistance Feed Self Need Assistance Walk Need Assistance Get In / Out Bed Need Assistance Housework Need Assistance Prepare Meals  Need Assistance Handle Money Need Assistance Shop for Self Need Assistance Electronic Signature(s) Signed: 07/19/2018 6:42:35 PM By: Gretta Cool, BSN, RN, CWS, Kim RN, BSN Entered By: Gretta Cool, BSN, RN, CWS, Kim on 07/18/2018 11:00:02 [name] ([id number]) -------------------------------------------------------------------------------- Education Assessment Details Patient Name: Sarah Reach D. Date of Service: 07/18/2018 10:15 AM Medical Record Number: [id number] Patient Account Number: [id number] Date of Birth/Sex: [birth year] (73 y.o. F) Treating RN: Cornell Barman Primary Care Declynn Lopresti: Jules Husbands Other Clinician: Referring Shaguana Love: Jules Husbands Treating Momoko Slezak/Extender: Sharalyn Ink in Treatment: 0 Primary Learner Assessed: Patient Learning Preferences/Education Level/Primary Language Learning Preference: Explanation, Demonstration Highest Education Level: High School Preferred Language: English Cognitive Barrier Assessment/Beliefs Language Barrier: No Translator Needed: No Memory Deficit: No Emotional Barrier: No Cultural/Religious Beliefs Affecting Medical Care: No Physical Barrier Assessment Impaired Vision: Yes Glasses Knowledge/Comprehension Assessment Knowledge Level: High Comprehension Level: High Ability to understand written High instructions: Ability to understand verbal High instructions: Motivation Assessment Anxiety Level: Calm Cooperation: Cooperative Education Importance: Acknowledges Need Interest in Health Problems: Asks Questions Perception: Coherent Willingness to Engage in Self- High Management Activities: Readiness to Engage in Self- High Management Activities: Electronic Signature(s) Signed: 07/19/2018 6:42:35 PM By: Gretta Cool, BSN, RN, CWS, Kim RN, BSN Entered By: Gretta Cool, BSN, RN, CWS, Kim on 07/18/2018 11:01:01 MILLEY, VINING  ([id number]) -------------------------------------------------------------------------------- Fall Risk Assessment Details Patient Name: Sarah Reach D. Date of Service: 07/18/2018 10:15 AM Medical Record Number: [id number] Patient Account Number: [id number] Date of Birth/Sex: 06-23-1945 (73 y.o. F) Treating RN: Cornell Barman Primary Care Kwynn Schlotter: Jules Husbands Other Clinician: Referring Fatma Rutten: Jules Husbands Treating Lottie Siska/Extender: Melburn Hake, HOYT Weeks in Treatment: 0 Fall Risk Assessment Items Have you had 2 or more falls in the last 12 monthso 0 No Have you had any fall that resulted in injury in the last 12 monthso 0 No FALL RISK ASSESSMENT: History of falling - immediate or within 3  months 0 No Secondary diagnosis 0 No Ambulatory aid None/bed rest/wheelchair/nurse 0 Yes Crutches/cane/walker 0 No Furniture 0 No IV Access/Saline Lock 0 No Gait/Training Normal/bed rest/immobile 0 Yes Weak 0 No Impaired 0 No Mental Status Oriented to own ability 0 Yes Electronic Signature(s) Signed: 07/19/2018 6:42:35 PM By: Gretta Cool, BSN, RN, CWS, Kim RN, BSN Entered By: Gretta Cool, BSN, RN, CWS, Kim on 07/18/2018 11:01:22 Garfinkel, [name] ([id number]) -------------------------------------------------------------------------------- Foot Assessment Details Patient Name: Sarah Reach D. Date of Service: 07/18/2018 10:15 AM Medical Record Number: [id number] Patient Account Number: [id number] Date of Birth/Sex: 31-Dec-1945 (73 y.o. F) Treating RN: Cornell Barman Primary Care Delfina Schreurs: Jules Husbands Other Clinician: Referring Nicoya Friel: Jules Husbands Treating Juliane Guest/Extender: Melburn Hake, HOYT Weeks in Treatment: 0 Foot Assessment Items Site Locations + = Sensation present, - = Sensation absent, C = Callus, U = Ulcer R = Redness, W = Warmth, M = Maceration, PU = Pre-ulcerative lesion F = Fissure, S = Swelling, D = Dryness Assessment Right: Left: Other Deformity: No No Prior Foot Ulcer:  No No Prior Amputation: No No Charcot Joint: No No Ambulatory Status: Non-ambulatory Assistance Device: Wheelchair Gait: Engineer, maintenance) Signed: 07/19/2018 6:42:35 PM By: Gretta Cool, BSN, RN, CWS, Kim RN, BSN Entered By: Gretta Cool, BSN, RN, CWS, Kim on 07/18/2018 11:02:19 Dreyfuss, [name] ([id number]) -------------------------------------------------------------------------------- Nutrition Risk Assessment Details Patient Name: Sarah Reach D. Date of Service: 07/18/2018 10:15 AM Medical Record Number: [id number] Patient Account Number: [id number] Date of Birth/Sex: [birth year] (73 y.o. F) Treating RN: Cornell Barman Primary Care Goro Wenrick: Jules Husbands Other Clinician: Referring Adriel Desrosier: Jules Husbands Treating Lan Entsminger/Extender: Melburn Hake, HOYT Weeks in Treatment: 0 Height (in): 60 Weight (lbs): 151.2 Body Mass Index (BMI): 29.5 Nutrition Risk Assessment Items NUTRITION RISK SCREEN: I have an illness or condition that made me change the kind and/or amount of 0 No food I eat I eat fewer than two meals per day 3 Yes I eat few fruits and vegetables, or milk products 0 No I have three or more drinks of beer, liquor or wine almost every day 0 No I have tooth or mouth problems that make it hard for me to eat 0 No I don't always have enough money to buy the food I need 0 No I eat alone most of the time 0 No I take three or more different prescribed or over-the-counter drugs a day 1 Yes Without wanting to, I have lost or gained 10 pounds in the last six months 0 No I am not always physically able to shop, cook and/or feed myself 0 No Nutrition Protocols Good Risk Protocol Provide education on elevated blood sugars and Moderate Risk Protocol 0 impact on wound healing, as applicable Electronic Signature(s) Signed: 07/19/2018 6:42:35 PM By: Gretta Cool, BSN, RN, CWS, Kim RN, BSN Entered By: Gretta Cool, BSN, RN, CWS, Kim on 07/18/2018 11:02:07

## 2018-07-25 ENCOUNTER — Telehealth: Payer: Self-pay | Admitting: Hematology

## 2018-07-25 NOTE — Telephone Encounter (Signed)
A new patient appt has been scheduled for the pt to see Dr. Irene Limbo on 3/3 at 1pm. Spoke to [facility] and provided the appt date and time.

## 2018-08-01 ENCOUNTER — Encounter: Payer: Medicare Other | Attending: Physician Assistant | Admitting: Physician Assistant

## 2018-08-01 ENCOUNTER — Inpatient Hospital Stay: Payer: Medicare Other

## 2018-08-01 ENCOUNTER — Inpatient Hospital Stay: Payer: Medicare Other | Attending: Gynecology | Admitting: Hematology

## 2018-08-01 ENCOUNTER — Telehealth: Payer: Self-pay | Admitting: Hematology

## 2018-08-01 VITALS — BP 141/81 | HR 83 | Temp 98.3°F | Resp 18 | Ht 60.0 in | Wt 148.2 lb

## 2018-08-01 DIAGNOSIS — M109 Gout, unspecified: Secondary | ICD-10-CM | POA: Insufficient documentation

## 2018-08-01 DIAGNOSIS — D72829 Elevated white blood cell count, unspecified: Secondary | ICD-10-CM | POA: Diagnosis present

## 2018-08-01 DIAGNOSIS — I10 Essential (primary) hypertension: Secondary | ICD-10-CM | POA: Insufficient documentation

## 2018-08-01 DIAGNOSIS — I110 Hypertensive heart disease with heart failure: Secondary | ICD-10-CM | POA: Insufficient documentation

## 2018-08-01 DIAGNOSIS — Z79899 Other long term (current) drug therapy: Secondary | ICD-10-CM | POA: Diagnosis not present

## 2018-08-01 DIAGNOSIS — L89153 Pressure ulcer of sacral region, stage 3: Secondary | ICD-10-CM | POA: Insufficient documentation

## 2018-08-01 DIAGNOSIS — N189 Chronic kidney disease, unspecified: Secondary | ICD-10-CM | POA: Insufficient documentation

## 2018-08-01 DIAGNOSIS — I509 Heart failure, unspecified: Secondary | ICD-10-CM | POA: Insufficient documentation

## 2018-08-01 DIAGNOSIS — I482 Chronic atrial fibrillation, unspecified: Secondary | ICD-10-CM | POA: Insufficient documentation

## 2018-08-01 DIAGNOSIS — H409 Unspecified glaucoma: Secondary | ICD-10-CM | POA: Insufficient documentation

## 2018-08-01 DIAGNOSIS — E11622 Type 2 diabetes mellitus with other skin ulcer: Secondary | ICD-10-CM | POA: Diagnosis present

## 2018-08-01 DIAGNOSIS — I5042 Chronic combined systolic (congestive) and diastolic (congestive) heart failure: Secondary | ICD-10-CM | POA: Insufficient documentation

## 2018-08-01 DIAGNOSIS — Z87891 Personal history of nicotine dependence: Secondary | ICD-10-CM | POA: Diagnosis not present

## 2018-08-01 DIAGNOSIS — E1142 Type 2 diabetes mellitus with diabetic polyneuropathy: Secondary | ICD-10-CM | POA: Insufficient documentation

## 2018-08-01 LAB — CBC WITH DIFFERENTIAL/PLATELET
Abs Immature Granulocytes: 0.07 10*3/uL (ref 0.00–0.07)
BASOS ABS: 0.1 10*3/uL (ref 0.0–0.1)
Basophils Relative: 1 %
Eosinophils Absolute: 2 10*3/uL — ABNORMAL HIGH (ref 0.0–0.5)
Eosinophils Relative: 11 %
HCT: 35.5 % — ABNORMAL LOW (ref 36.0–46.0)
Hemoglobin: 12 g/dL (ref 12.0–15.0)
Immature Granulocytes: 0 %
Lymphocytes Relative: 18 %
Lymphs Abs: 3.4 10*3/uL (ref 0.7–4.0)
MCH: 29.7 pg (ref 26.0–34.0)
MCHC: 33.8 g/dL (ref 30.0–36.0)
MCV: 87.9 fL (ref 80.0–100.0)
Monocytes Absolute: 1 10*3/uL (ref 0.1–1.0)
Monocytes Relative: 5 %
NRBC: 0 % (ref 0.0–0.2)
Neutro Abs: 12.2 10*3/uL — ABNORMAL HIGH (ref 1.7–7.7)
Neutrophils Relative %: 65 %
Platelets: 434 10*3/uL — ABNORMAL HIGH (ref 150–400)
RBC: 4.04 MIL/uL (ref 3.87–5.11)
RDW: 15.2 % (ref 11.5–15.5)
WBC: 18.8 10*3/uL — ABNORMAL HIGH (ref 4.0–10.5)

## 2018-08-01 LAB — LACTATE DEHYDROGENASE: LDH: 171 U/L (ref 98–192)

## 2018-08-01 LAB — CMP (CANCER CENTER ONLY)
ALT: 31 U/L (ref 0–44)
AST: 63 U/L — ABNORMAL HIGH (ref 15–41)
Albumin: 3 g/dL — ABNORMAL LOW (ref 3.5–5.0)
Alkaline Phosphatase: 192 U/L — ABNORMAL HIGH (ref 38–126)
Anion gap: 10 (ref 5–15)
BUN: 17 mg/dL (ref 8–23)
CO2: 27 mmol/L (ref 22–32)
Calcium: 9.3 mg/dL (ref 8.9–10.3)
Chloride: 105 mmol/L (ref 98–111)
Creatinine: 1.23 mg/dL — ABNORMAL HIGH (ref 0.44–1.00)
GFR, Est AFR Am: 50 mL/min — ABNORMAL LOW (ref >60)
GFR, Estimated: 43 mL/min — ABNORMAL LOW (ref >60)
Glucose, Bld: 114 mg/dL — ABNORMAL HIGH (ref 70–99)
Potassium: 4.3 mmol/L (ref 3.5–5.1)
Sodium: 142 mmol/L (ref 135–145)
Total Bilirubin: 0.4 mg/dL (ref 0.3–1.2)
Total Protein: 7.9 g/dL (ref 6.5–8.1)
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
## 2018-08-01 NOTE — Telephone Encounter (Signed)
Gave avs and calendar ° °

## 2018-08-04 NOTE — Progress Notes (Addendum)
OLIVER, NEUWIRTH ([id number]) Visit Report for 08/01/2018 Arrival Information Details Patient Name: Sarah Colon. Date of Service: 08/01/2018 10:00 AM Medical Record Number: [id number] Patient Account Number: [id number] Date of Birth/Sex: 03-Apr-1946 (73 y.o. Female) Treating RN: Army Melia Primary Care Mittie Knittel: Jules Husbands Other Clinician: Referring Lasonya Hubner: Jules Husbands Treating Marveen Donlon/Extender: Melburn Hake, HOYT Weeks in Treatment: 2 Visit Information History Since Last Visit Added or deleted any medications: No Patient Arrived: Wheel Chair Any new allergies or adverse reactions: No Arrival Time: 10:30 Had a fall or experienced change in No Accompanied By: caregiver activities of daily living that may affect Transfer Assistance: Harrel Lemon Lift risk of falls: Patient Has Alerts: Yes Signs or symptoms of abuse/neglect since last visito No Patient Alerts: Patient on Blood Thinner Hospitalized since last visit: No Xarelto Implantable device outside of the clinic excluding No Type II Diabetic cellular tissue based products placed in the center since last visit: Has Dressing in Place as Prescribed: Yes Pain Present Now: No Electronic Signature(s) Signed: 08/01/2018 4:08:43 PM By: Army Melia Entered By: Army Melia on 08/01/2018 10:31:49 Butch, Sya D. ([id number]) -------------------------------------------------------------------------------- Lower Extremity Assessment Details Patient Name: Sarah Reach D. Date of Service: 08/01/2018 10:00 AM Medical Record Number: [id number] Patient Account Number: [id number] Date of Birth/Sex: 12-15-1945 (73 y.o. Female) Treating RN: Army Melia Primary Care Emmanuela Ghazi: Jules Husbands Other Clinician: Referring Amay Mijangos: Jules Husbands Treating Rogelio Waynick/Extender: Melburn Hake, HOYT Weeks in Treatment: 2 Electronic Signature(s) Signed: 08/01/2018 4:08:43 PM By: Army Melia Entered By: Army Melia on 08/01/2018 10:35:26 Quintanar,  [name] ([id number]) -------------------------------------------------------------------------------- Multi Wound Chart Details Patient Name: Sarah Reach D. Date of Service: 08/01/2018 10:00 AM Medical Record Number: [id number] Patient Account Number: [id number] Date of Birth/Sex: 02-21-1946 (73 y.o. Female) Treating RN: Montey Hora Primary Care Benito Lemmerman: Jules Husbands Other Clinician: Referring Marceline Napierala: Jules Husbands Treating Saliah Crisp/Extender: Melburn Hake, HOYT Weeks in Treatment: 2 Vital Signs Height(in): 60 Pulse(bpm): 40 Weight(lbs): 151.2 Blood Pressure(mmHg): 147/54 Body Mass Index(BMI): 30 Temperature(F): 98.4 Respiratory Rate 16 (breaths/min): Photos: [1:No Photos] [N/A:N/A] Wound Location: [1:Sacrum - Midline] [N/A:N/A] Wounding Event: [1:Pressure Injury] [N/A:N/A] Primary Etiology: [1:Pressure Ulcer] [N/A:N/A] Comorbid History: [1:Cataracts, Glaucoma, Arrhythmia, Congestive Heart Failure, Hypertension, Type II Diabetes, History of pressure wounds] [N/A:N/A] Date Acquired: [1:05/31/2017] [N/A:N/A] Weeks of Treatment: [1:2] [N/A:N/A] Wound Status: [1:Open] [N/A:N/A] Measurements L x W x D [1:1x0.6x0.5] [N/A:N/A] (cm) Area (cm) : [1:0.471] [N/A:N/A] Volume (cm) : [1:0.236] [N/A:N/A] % Reduction in Area: [1:0.00%] [N/A:N/A] % Reduction in Volume: [1:0.00%] [N/A:N/A] Classification: [1:Category/Stage III] [N/A:N/A] Exudate Amount: [1:Medium] [N/A:N/A] Exudate Type: [1:Purulent] [N/A:N/A] Exudate Color: [1:yellow, brown, green] [N/A:N/A] Wound Margin: [1:Thickened] [N/A:N/A] Granulation Amount: [1:Medium (34-66%)] [N/A:N/A] Granulation Quality: [1:Pink] [N/A:N/A] Necrotic Amount: [1:Medium (34-66%)] [N/A:N/A] Exposed Structures: [1:Fat Layer (Subcutaneous Tissue) Exposed: Yes Fascia: No Tendon: No Muscle: No Joint: No Bone: No] [N/A:N/A] Epithelialization: [1:None] [N/A:N/A] Periwound Skin Texture: [1:Scarring: Yes Excoriation: No Induration: No]  [N/A:N/A] Callus: No Crepitus: No Rash: No Periwound Skin Moisture: Maceration: Yes N/A N/A Dry/Scaly: No Periwound Skin Color: Erythema: Yes N/A N/A Atrophie Blanche: No Cyanosis: No Ecchymosis: No Hemosiderin Staining: No Mottled: No Pallor: No Rubor: No Erythema Location: Circumferential N/A N/A Temperature: No Abnormality N/A N/A Tenderness on Palpation: Yes N/A N/A Wound Preparation: Ulcer Cleansing: N/A N/A Rinsed/Irrigated with Saline Topical Anesthetic Applied: Other: lidocaine 4% Treatment Notes Electronic Signature(s) Signed: 08/01/2018 5:24:46 PM By: Montey Hora Entered By: Montey Hora on 08/01/2018 10:49:51 Sarah Colon ([id number]) -------------------------------------------------------------------------------- Bud Details Patient Name: Sarah Reach D. Date of Service: 08/01/2018 10:00 AM Medical  Record Number: [id number] Patient Account Number: [id number] Date of Birth/Sex: 05-17-1946 (73 y.o. Female) Treating RN: Montey Hora Primary Care Kydan Shanholtzer: Jules Husbands Other Clinician: Referring Adelyne Marchese: Jules Husbands Treating Neela Zecca/Extender: Sharalyn Ink in Treatment: 2 Active Inactive Electronic Signature(s) Signed: 08/15/2018 10:11:52 AM By: Gretta Cool, BSN, RN, CWS, Kim RN, BSN Signed: 08/24/2018 10:13:59 AM By: Montey Hora Previous Signature: 08/01/2018 5:24:46 PM Version By: Montey Hora Entered By: Gretta Cool BSN, RN, CWS, Kim on 08/15/2018 10:11:51 Sarah Colon ([id number]) -------------------------------------------------------------------------------- Pain Assessment Details Patient Name: Sarah Reach D. Date of Service: 08/01/2018 10:00 AM Medical Record Number: [id number] Patient Account Number: [id number] Date of Birth/Sex: [birth year] (73 y.o. Female) Treating RN: Army Melia Primary Care Sara Keys: Jules Husbands Other Clinician: Referring Yula Crotwell: Jules Husbands Treating  Jayston Trevino/Extender: Melburn Hake, HOYT Weeks in Treatment: 2 Active Problems Location of Pain Severity and Description of Pain Patient Has Paino No Site Locations Pain Management and Medication Current Pain Management: Electronic Signature(s) Signed: 08/01/2018 4:08:43 PM By: Army Melia Entered By: Army Melia on 08/01/2018 10:32:01 Zeitz, Sarah Colon ([id number]) -------------------------------------------------------------------------------- Patient/Caregiver Education Details Patient Name: Sarah Reach D. Date of Service: 08/01/2018 10:00 AM Medical Record Number: [id number] Patient Account Number: [id number] Date of Birth/Gender: 1946-05-27 (73 y.o. Female) Treating RN: Montey Hora Primary Care Physician: Jules Husbands Other Clinician: Referring Physician: Jules Husbands Treating Physician/Extender: Sharalyn Ink in Treatment: 2 Education Assessment Education Provided To: Caregiver SNF nurses via written orders Education Topics Provided Wound/Skin Impairment: Handouts: Other: signed wound care orders Methods: Printed Electronic Signature(s) Signed: 08/01/2018 5:24:46 PM By: Montey Hora Entered By: Montey Hora on 08/01/2018 10:50:39 Koepp, [name] ([id number]) -------------------------------------------------------------------------------- Wound Assessment Details Patient Name: Sarah Reach D. Date of Service: 08/01/2018 10:00 AM Medical Record Number: [id number] Patient Account Number: [id number] Date of Birth/Sex: 30-Sep-1945 (73 y.o. Female) Treating RN: Army Melia Primary Care Mykala Mccready: Jules Husbands Other Clinician: Referring Trenese Haft: Jules Husbands Treating Aquiles Ruffini/Extender: Melburn Hake, HOYT Weeks in Treatment: 2 Wound Status Wound Number: 1 Primary Pressure Ulcer Etiology: Wound Location: Sacrum - Midline Wound Open Wounding Event: Pressure Injury Status: Date Acquired: 05/31/2017 Comorbid Cataracts, Glaucoma, Arrhythmia,  Congestive Weeks Of Treatment: 2 History: Heart Failure, Hypertension, Type II Diabetes, Clustered Wound: No History of pressure wounds Photos Photo Uploaded By: Army Melia on 08/01/2018 11:37:54 Wound Measurements Length: (cm) 1 Width: (cm) 0.6 Depth: (cm) 0.5 Area: (cm) 0.471 Volume: (cm) 0.236 % Reduction in Area: 0% % Reduction in Volume: 0% Epithelialization: None Tunneling: No Undermining: No Wound Description Classification: Category/Stage III Foul Odo Wound Margin: Thickened Slough/F Exudate Amount: Medium Exudate Type: Purulent Exudate Color: yellow, brown, green r After Cleansing: No ibrino No Wound Bed Granulation Amount: Medium (34-66%) Exposed Structure Granulation Quality: Pink Fascia Exposed: No Necrotic Amount: Medium (34-66%) Fat Layer (Subcutaneous Tissue) Exposed: Yes Necrotic Quality: Adherent Slough Tendon Exposed: No Muscle Exposed: No Joint Exposed: No Bone Exposed: No Periwound Skin Texture Galvan, [name] ([id number]) Texture Color No Abnormalities Noted: No No Abnormalities Noted: No Callus: No Atrophie Blanche: No Crepitus: No Cyanosis: No Excoriation: No Ecchymosis: No Induration: No Erythema: Yes Rash: No Erythema Location: Circumferential Scarring: Yes Hemosiderin Staining: No Mottled: No Moisture Pallor: No No Abnormalities Noted: No Rubor: No Dry / Scaly: No Maceration: Yes Temperature / Pain Temperature: No Abnormality Tenderness on Palpation: Yes Wound Preparation Ulcer Cleansing: Rinsed/Irrigated with Saline Topical Anesthetic Applied: Other: lidocaine 4%, Electronic Signature(s) Signed: 08/01/2018 4:08:43 PM By: Army Melia Entered By: Army Melia on 08/01/2018 10:35:17 Mochizuki, [name] ([id number]) -------------------------------------------------------------------------------- Vitals Details Patient  Name: ANARI, EVITT. Date of Service: 08/01/2018 10:00 AM Medical Record Number:  [id number] Patient Account Number: [id number] Date of Birth/Sex: 12/28/1945 (73 y.o. Female) Treating RN: Army Melia Primary Care Ebrahim Deremer: Jules Husbands Other Clinician: Referring Amato Sevillano: Jules Husbands Treating Banks Chaikin/Extender: Melburn Hake, HOYT Weeks in Treatment: 2 Vital Signs Time Taken: 10:30 Temperature (F): 98.4 Height (in): 60 Pulse (bpm): 82 Weight (lbs): 151.2 Respiratory Rate (breaths/min): 16 Body Mass Index (BMI): 29.5 Blood Pressure (mmHg): 147/54 Reference Range: 80 - 120 mg / dl Electronic Signature(s) Signed: 08/01/2018 4:08:43 PM By: Army Melia Entered By: Army Melia on 08/01/2018 10:33:50

## 2018-08-05 NOTE — Progress Notes (Signed)
Sarah Colon ([id number]) Visit Report for 08/01/2018 Chief Complaint Document Details Patient Name: Sarah Colon, Sarah Colon. Date of Service: 08/01/2018 10:00 AM Medical Record Number: [id number] Patient Account Number: [id number] Date of Birth/Sex: [birth year] (72 y.o. Female) Treating RN: Montey Hora Primary Care Provider: Jules Husbands Other Clinician: Referring Provider: Jules Husbands Treating Provider/Extender: Melburn Hake, HOYT Weeks in Treatment: 2 Information Obtained from: Patient Chief Complaint Sacral Pressure ulcer Electronic Signature(s) Signed: 08/02/2018 11:58:16 PM By: Worthy Keeler PA-C Entered By: Worthy Keeler on 08/01/2018 10:01:49 Difiore, Leandra DMarland Kitchen ([id number]) -------------------------------------------------------------------------------- Debridement Details Patient Name: Sarah Reach D. Date of Service: 08/01/2018 10:00 AM Medical Record Number: [id number] Patient Account Number: [id number] Date of Birth/Sex: 10/26/1945 (72 y.o. Female) Treating RN: Montey Hora Primary Care Provider: Jules Husbands Other Clinician: Referring Provider: Jules Husbands Treating Provider/Extender: Melburn Hake, HOYT Weeks in Treatment: 2 Debridement Performed for Wound #1 Midline Sacrum Assessment: Performed By: Physician STONE III, HOYT E., PA-C Debridement Type: Debridement Level of Consciousness (Pre- Awake and Alert procedure): Pre-procedure Verification/Time Yes - 10:53 Out Taken: Start Time: 10:53 Pain Control: Lidocaine 4% Topical Solution Total Area Debrided (L x W): 1 (cm) x 0.6 (cm) = 0.6 (cm) Tissue and other material Viable, Non-Viable, Callus, Slough, Subcutaneous, Slough debrided: Level: Skin/Subcutaneous Tissue Debridement Description: Excisional Instrument: Curette Bleeding: Minimum Hemostasis Achieved: Pressure End Time: 10:56 Procedural Pain: 0 Post Procedural Pain: 0 Response to Treatment: Procedure was tolerated well Level of  Consciousness Awake and Alert (Post-procedure): Post Debridement Measurements of Total Wound Length: (cm) 1 Stage: Category/Stage III Width: (cm) 0.6 Depth: (cm) 0.6 Volume: (cm) 0.283 Character of Wound/Ulcer Post Improved Debridement: Post Procedure Diagnosis Same as Pre-procedure Electronic Signature(s) Signed: 08/01/2018 5:24:46 PM By: Montey Hora Signed: 08/02/2018 11:58:16 PM By: Worthy Keeler PA-C Entered By: Montey Hora on 08/01/2018 10:54:37 Pennino, Desirey D. ([id number]) -------------------------------------------------------------------------------- HPI Details Patient Name: Sarah Reach D. Date of Service: 08/01/2018 10:00 AM Medical Record Number: [id number] Patient Account Number: [id number] Date of Birth/Sex: Oct 17, 1945 (72 y.o. Female) Treating RN: Montey Hora Primary Care Provider: Jules Husbands Other Clinician: Referring Provider: Jules Husbands Treating Provider/Extender: Melburn Hake, HOYT Weeks in Treatment: 2 History of Present Illness HPI Description: 07/18/18 on evaluation today patient presents with a pressure ulcer to the sacral region which is a stage III ulcer. She states that this initially occurred about a year ago closed and then reopened several weeks ago though she does not have a specific timeframe for this. Unfortunately she states there is some discomfort at this time. She does have a history of atrial fibrillation, congestive heart failure, hypertension, diabetes mellitus type II, and her most recent hemoglobin A1c was 6.8 on 05/30/18. She did have a CT scan of the sacral region on 06/19/18 this was negative for osteomyelitis which is excellent news. Patient was on doxycycline two weeks ago and it appears based on record review that she also is currently on Cipro. There is some question about whether not she has a urinary tract infection. She did have a CBC performed which showed a white blood cell count at 20 which again indicates some  kind of infection in my pinion. Again upon inspection today I seriously doubt that this is an infection secondary to the wound. I seen no signs of infection here. No fevers, chills, nausea, or vomiting noted at this time. The patient in fact states she feels really well. This again I feel like leads credence to the fact that she may have a urinary tract infection. 3/3/20Patient's wound  bed currently shows evidence of great granulation at this time and does not appear to be signs of infection currently which is excellent news. It's actually fairly encouraging the wound is not doing any worse at this point. I am pleased with he address it has made. Electronic Signature(s) Signed: 08/02/2018 11:58:16 PM By: Worthy Keeler PA-C Entered By: Worthy Keeler on 08/02/2018 23:34:23 Weekly, [name] ([id number]) -------------------------------------------------------------------------------- Physical Exam Details Patient Name: Sarah Reach D. Date of Service: 08/01/2018 10:00 AM Medical Record Number: [id number] Patient Account Number: [id number] Date of Birth/Sex: 07/08/1945 (72 y.o. Female) Treating RN: Montey Hora Primary Care Provider: Jules Husbands Other Clinician: Referring Provider: Jules Husbands Treating Provider/Extender: Melburn Hake, HOYT Weeks in Treatment: 2 Constitutional Well-nourished and well-hydrated in no acute distress. Respiratory normal breathing without difficulty. clear to auscultation bilaterally. Cardiovascular regular rate and rhythm with normal S1, S2. Psychiatric this patient is able to make decisions and demonstrates good insight into disease process. Alert and Oriented x 3. pleasant and cooperative. Electronic Signature(s) Signed: 08/02/2018 11:58:16 PM By: Worthy Keeler PA-C Entered By: Worthy Keeler on 08/02/2018 23:34:50 Mijangos, [name] ([id number]) -------------------------------------------------------------------------------- Physician Orders  Details Patient Name: Sarah Reach D. Date of Service: 08/01/2018 10:00 AM Medical Record Number: [id number] Patient Account Number: [id number] Date of Birth/Sex: [birth year] (72 y.o. Female) Treating RN: Montey Hora Primary Care Provider: Jules Husbands Other Clinician: Referring Provider: Jules Husbands Treating Provider/Extender: Melburn Hake, HOYT Weeks in Treatment: 2 Verbal / Phone Orders: No Diagnosis Coding ICD-10 Coding Code Description L89.153 Pressure ulcer of sacral region, stage 3 I10 Essential (primary) hypertension E11.622 Type 2 diabetes mellitus with other skin ulcer I48.20 Chronic atrial fibrillation, unspecified I50.42 Chronic combined systolic (congestive) and diastolic (congestive) heart failure Wound Cleansing Wound #1 Midline Sacrum o Clean wound with Normal Saline. Skin Barriers/Peri-Wound Care Wound #1 Midline Sacrum o Skin Prep Primary Wound Dressing Wound #1 Midline Sacrum o Silver Collagen Secondary Dressing Wound #1 Midline Sacrum o Boardered Foam Dressing Dressing Change Frequency Wound #1 Midline Sacrum o Change dressing every day. - AND AS NEEDED FOR SOILAGE - SNF NURSES TO CHECK DRESSING INTEGRITY EVERY SHIFT Follow-up Appointments Wound #1 Midline Sacrum o Return Appointment in 2 weeks. Off-Loading Wound #1 Midline Sacrum o Roho cushion for wheelchair - SNF TO PROVIDE ROHO CUHION FOR PATIENT'S [facility] Electronic Signature(s) Signed: 08/01/2018 5:24:46 PM By: Crecencio Mc, AIKA BRZOSKA ([id number]) Signed: 08/02/2018 11:58:16 PM By: Worthy Keeler PA-C Entered By: Montey Hora on 08/01/2018 10:55:09 Hillmer, Salimatou DMarland Kitchen ([id number]) -------------------------------------------------------------------------------- Problem List Details Patient Name: Sarah Reach D. Date of Service: 08/01/2018 10:00 AM Medical Record Number: [id number] Patient Account Number: [id number] Date of Birth/Sex: Jun 06, 1945 (72 y.o.  Female) Treating RN: Montey Hora Primary Care Provider: Jules Husbands Other Clinician: Referring Provider: Jules Husbands Treating Provider/Extender: Melburn Hake, HOYT Weeks in Treatment: 2 Active Problems ICD-10 Evaluated Encounter Code Description Active Date Today Diagnosis L89.153 Pressure ulcer of sacral region, stage 3 07/18/2018 No Yes I10 Essential (primary) hypertension 07/18/2018 No Yes E11.622 Type 2 diabetes mellitus with other skin ulcer 07/18/2018 No Yes I48.20 Chronic atrial fibrillation, unspecified 07/18/2018 No Yes I50.42 Chronic combined systolic (congestive) and diastolic 9/48/5462 No Yes (congestive) heart failure Inactive Problems Resolved Problems Electronic Signature(s) Signed: 08/02/2018 11:58:16 PM By: Worthy Keeler PA-C Entered By: Worthy Keeler on 08/01/2018 10:01:42 Iles, Irisha D. ([id number]) -------------------------------------------------------------------------------- Progress Note Details Patient Name: Sarah Reach D. Date of Service: 08/01/2018 10:00 AM Medical Record Number: [id number] Patient Account Number: [id number] Date of  Birth/Sex: 01/29/1946 (73 y.o. Female) Treating RN: Montey Hora Primary Care Provider: Jules Husbands Other Clinician: Referring Provider: Jules Husbands Treating Provider/Extender: Melburn Hake, HOYT Weeks in Treatment: 2 Subjective Chief Complaint Information obtained from Patient Sacral Pressure ulcer History of Present Illness (HPI) 07/18/18 on evaluation today patient presents with a pressure ulcer to the sacral region which is a stage III ulcer. She states that this initially occurred about a year ago closed and then reopened several weeks ago though she does not have a specific timeframe for this. Unfortunately she states there is some discomfort at this time. She does have a history of atrial fibrillation, congestive heart failure, hypertension, diabetes mellitus type II, and her most recent hemoglobin  A1c was 6.8 on 05/30/18. She did have a CT scan of the sacral region on 06/19/18 this was negative for osteomyelitis which is excellent news. Patient was on doxycycline two weeks ago and it appears based on record review that she also is currently on Cipro. There is some question about whether not she has a urinary tract infection. She did have a CBC performed which showed a white blood cell count at 20 which again indicates some kind of infection in my pinion. Again upon inspection today I seriously doubt that this is an infection secondary to the wound. I seen no signs of infection here. No fevers, chills, nausea, or vomiting noted at this time. The patient in fact states she feels really well. This again I feel like leads credence to the fact that she may have a urinary tract infection. 3/3/20Patient's wound bed currently shows evidence of great granulation at this time and does not appear to be signs of infection currently which is excellent news. It's actually fairly encouraging the wound is not doing any worse at this point. I am pleased with he address it has made. Patient History Information obtained from Patient. Family History No family history of Cancer, Diabetes, Heart Disease, Hypertension, Kidney Disease, Lung Disease, Seizures, Stroke, Thyroid Problems, Tuberculosis. Social History Former smoker - 39 hears ago, Marital Status - Widowed, Alcohol Use - Never, Drug Use - No History, Caffeine Use - Moderate. Medical History Eyes Patient has history of Cataracts - removed, Glaucoma Denies history of Optic Neuritis Ear/Nose/Mouth/Throat Denies history of Chronic sinus problems/congestion, Middle ear problems Hematologic/Lymphatic Denies history of Anemia, Hemophilia, Human Immunodeficiency Virus, Lymphedema, Sickle Cell Disease Respiratory Denies history of Aspiration, Asthma, Chronic Obstructive Pulmonary Disease (COPD), Pneumothorax, Sleep  Apnea, Tuberculosis Cardiovascular Patient has history of Arrhythmia, Congestive Heart Failure, Hypertension Emanuelson, Lihanna D. ([id number]) Denies history of Angina, Coronary Artery Disease, Deep Vein Thrombosis, Hypotension, Myocardial Infarction, Peripheral Arterial Disease, Peripheral Venous Disease, Phlebitis, Vasculitis Gastrointestinal Denies history of Cirrhosis , Colitis, Crohn s, Hepatitis A, Hepatitis B, Hepatitis C Endocrine Patient has history of Type II Diabetes Denies history of Type I Diabetes Genitourinary Denies history of End Stage Renal Disease Immunological Denies history of Lupus Erythematosus, Raynaud s, Scleroderma Integumentary (Skin) Patient has history of History of pressure wounds Denies history of History of Burn Musculoskeletal Denies history of Gout, Rheumatoid Arthritis, Osteoarthritis, Osteomyelitis Neurologic Denies history of Dementia, Neuropathy, Quadriplegia, Paraplegia, Seizure Disorder Oncologic Denies history of Received Chemotherapy, Received Radiation Psychiatric Denies history of Anorexia/bulimia, Confinement Anxiety Hospitalization/Surgery History - [facility], [name], Review of Systems (ROS) Constitutional Symptoms (General Health) Denies complaints or symptoms of Fever, Chills. Respiratory The patient has no complaints or symptoms. Cardiovascular The patient has no complaints or symptoms. Psychiatric The patient has no complaints or symptoms. Objective  Constitutional Well-nourished and well-hydrated in no acute distress. Vitals Time Taken: 10:30 AM, Height: 60 in, Weight: 151.2 lbs, BMI: 29.5, Temperature: 98.4 F, Pulse: 82 bpm, Respiratory Rate: 16 breaths/min, Blood Pressure: 147/54 mmHg. Respiratory normal breathing without difficulty. clear to auscultation bilaterally. Cardiovascular regular rate and rhythm with normal S1, S2. Psychiatric Brash, Datra D. ([id number]) this patient is able to make decisions and  demonstrates good insight into disease process. Alert and Oriented x 3. pleasant and cooperative. Integumentary (Hair, Skin) Wound #1 status is Open. Original cause of wound was Pressure Injury. The wound is located on the Midline Sacrum. The wound measures 1cm length x 0.6cm width x 0.5cm depth; 0.471cm^2 area and 0.236cm^3 volume. There is Fat Layer (Subcutaneous Tissue) Exposed exposed. There is no tunneling or undermining noted. There is a medium amount of purulent drainage noted. The wound margin is thickened. There is medium (34-66%) pink granulation within the wound bed. There is a medium (34-66%) amount of necrotic tissue within the wound bed including Adherent Slough. The periwound skin appearance exhibited: Scarring, Maceration, Erythema. The periwound skin appearance did not exhibit: Callus, Crepitus, Excoriation, Induration, Rash, Dry/Scaly, Atrophie Blanche, Cyanosis, Ecchymosis, Hemosiderin Staining, Mottled, Pallor, Rubor. The surrounding wound skin color is noted with erythema which is circumferential. Periwound temperature was noted as No Abnormality. The periwound has tenderness on palpation. Assessment Active Problems ICD-10 Pressure ulcer of sacral region, stage 3 Essential (primary) hypertension Type 2 diabetes mellitus with other skin ulcer Chronic atrial fibrillation, unspecified Chronic combined systolic (congestive) and diastolic (congestive) heart failure Procedures Wound #1 Pre-procedure diagnosis of Wound #1 is a Pressure Ulcer located on the Midline Sacrum . There was a Excisional Skin/Subcutaneous Tissue Debridement with a total area of 0.6 sq cm performed by STONE III, HOYT E., PA-C. With the following instrument(s): Curette to remove Viable and Non-Viable tissue/material. Material removed includes Callus, Subcutaneous Tissue, and Slough after achieving pain control using Lidocaine 4% Topical Solution. No specimens were taken. A time out was conducted at  10:53, prior to the start of the procedure. A Minimum amount of bleeding was controlled with Pressure. The procedure was tolerated well with a pain level of 0 throughout and a pain level of 0 following the procedure. Post Debridement Measurements: 1cm length x 0.6cm width x 0.6cm depth; 0.283cm^3 volume. Post debridement Stage noted as Category/Stage III. Character of Wound/Ulcer Post Debridement is improved. Post procedure Diagnosis Wound #1: Same as Pre-Procedure Plan Wound Cleansing: Wound #1 Midline Sacrum: Bukowski, Corryn D. ([id number]) Clean wound with Normal Saline. Skin Barriers/Peri-Wound Care: Wound #1 Midline Sacrum: Skin Prep Primary Wound Dressing: Wound #1 Midline Sacrum: Silver Collagen Secondary Dressing: Wound #1 Midline Sacrum: Boardered Foam Dressing Dressing Change Frequency: Wound #1 Midline Sacrum: Change dressing every day. - AND AS NEEDED FOR SOILAGE - SNF NURSES TO CHECK DRESSING INTEGRITY EVERY SHIFT Follow-up Appointments: Wound #1 Midline Sacrum: Return Appointment in 2 weeks. Off-Loading: Wound #1 Midline Sacrum: Roho cushion for wheelchair - SNF TO [facility] My suggestion currently is gonna be that we go ahead and continue with the above wound care measures the next week is the patient is in agreement with plan. We will subsequently see were things stand at follow-up. Please see above for specific wound care orders. We will see patient for re-evaluation in 2 week(s) here in the clinic. If anything worsens or changes patient will contact our office for additional recommendations. if Varney Biles gets up and running as far as coming to [place] be  more than happy to pass off care of this patient to her to see in facility which the patient would prefer as well. With that being said I just need to make sure that as appropriate follow- up within the facility. We'll see were things stand otherwise in two weeks. Electronic  Signature(s) Signed: 08/02/2018 11:58:16 PM By: Worthy Keeler PA-C Entered By: Worthy Keeler on 08/02/2018 23:35:02 Kamer, [name] ([id number]) -------------------------------------------------------------------------------- ROS/PFSH Details Patient Name: Sarah Reach D. Date of Service: 08/01/2018 10:00 AM Medical Record Number: [id number] Patient Account Number: [id number] Date of Birth/Sex: [birth year] (72 y.o. Female) Treating RN: Montey Hora Primary Care Provider: Jules Husbands Other Clinician: Referring Provider: Jules Husbands Treating Provider/Extender: Melburn Hake, HOYT Weeks in Treatment: 2 Information Obtained From Patient Wound History Do you currently have one or more open woundso Yes How many open wounds do you currently haveo 1 Approximately how long have you had your woundso 1 year Has your wound(s) ever healed and then re-openedo Yes Have you had any lab work done in the past montho Yes Have you tested positive for osteomyelitis (bone infection)o No Constitutional Symptoms (General Health) Complaints and Symptoms: Negative for: Fever; Chills Eyes Medical History: Positive for: Cataracts - removed; Glaucoma Negative for: Optic Neuritis Ear/Nose/Mouth/Throat Medical History: Negative for: Chronic sinus problems/congestion; Middle ear problems Hematologic/Lymphatic Medical History: Negative for: Anemia; Hemophilia; Human Immunodeficiency Virus; Lymphedema; Sickle Cell Disease Respiratory Complaints and Symptoms: No Complaints or Symptoms Medical History: Negative for: Aspiration; Asthma; Chronic Obstructive Pulmonary Disease (COPD); Pneumothorax; Sleep Apnea; Tuberculosis Cardiovascular Complaints and Symptoms: No Complaints or Symptoms Medical History: Positive for: Arrhythmia; Congestive Heart Failure; Hypertension Negative for: Angina; Coronary Artery Disease; Deep Vein Thrombosis; Hypotension; Myocardial Infarction; Peripheral Costantino, Appollonia  D. ([id number]) Arterial Disease; Peripheral Venous Disease; Phlebitis; Vasculitis Gastrointestinal Medical History: Negative for: Cirrhosis ; Colitis; Crohnos; Hepatitis A; Hepatitis B; Hepatitis C Endocrine Medical History: Positive for: Type II Diabetes Negative for: Type I Diabetes Time with diabetes: 45years Blood sugar tested every day: Yes Tested : Genitourinary Medical History: Negative for: End Stage Renal Disease Immunological Medical History: Negative for: Lupus Erythematosus; Raynaudos; Scleroderma Integumentary (Skin) Medical History: Positive for: History of pressure wounds Negative for: History of Burn Musculoskeletal Medical History: Negative for: Gout; Rheumatoid Arthritis; Osteoarthritis; Osteomyelitis Neurologic Medical History: Negative for: Dementia; Neuropathy; Quadriplegia; Paraplegia; Seizure Disorder Oncologic Medical History: Negative for: Received Chemotherapy; Received Radiation Psychiatric Complaints and Symptoms: No Complaints or Symptoms Medical History: Negative for: Anorexia/bulimia; Confinement Anxiety HBO Extended History Items Eyes: Eyes: Cataracts Glaucoma Sieloff, Macarena D. ([id number]) Immunizations Pneumococcal Vaccine: Received Pneumococcal Vaccination: Yes Implantable Devices No devices added Hospitalization / Surgery History Name of Hospital Date [name] Family and Social History Cancer: No; Diabetes: No; Heart Disease: No; Hypertension: No; Kidney Disease: No; Lung Disease: No; Seizures: No; Stroke: No; Thyroid Problems: No; Tuberculosis: No; Former smoker - 63 hears ago; Marital Status - Widowed; Alcohol Use: Never; Drug Use: No History; Caffeine Use: Moderate; Do not resuscitate: No; Living Will: Yes (Not Provided); Medical Power of Attorney: No Physician Affirmation I have reviewed and agree with the above information. Electronic Signature(s) Signed: 08/02/2018 11:58:16 PM By: Worthy Keeler  PA-C Signed: 08/03/2018 4:54:37 PM By: Montey Hora Entered By: Worthy Keeler on 08/02/2018 23:34:38 Hudon, Atalaya D. ([id number]) -------------------------------------------------------------------------------- SuperBill Details Patient Name: Sarah Reach D. Date of Service: 08/01/2018 Medical Record Number: [id number] Patient Account Number: [id number] Date of Birth/Sex: 22-Jun-1945 (73 y.o. Female) Treating RN: Montey Hora Primary Care Provider: Jules Husbands Other  Clinician: Referring Provider: Jules Husbands Treating Provider/Extender: Melburn Hake, HOYT Weeks in Treatment: 2 Diagnosis Coding ICD-10 Codes Code Description L89.153 Pressure ulcer of sacral region, stage 3 I10 Essential (primary) hypertension E11.622 Type 2 diabetes mellitus with other skin ulcer I48.20 Chronic atrial fibrillation, unspecified I50.42 Chronic combined systolic (congestive) and diastolic (congestive) heart failure Facility Procedures CPT4 Code: [id number] Description: 24268 - DEB SUBQ TISSUE 20 SQ CM/< ICD-10 Diagnosis Description L89.153 Pressure ulcer of sacral region, stage 3 Modifier: Quantity: 1 Physician Procedures CPT4 Code: [id number] Description: 11042 - WC PHYS SUBQ TISS 20 SQ CM ICD-10 Diagnosis Description L89.153 Pressure ulcer of sacral region, stage 3 Modifier: Quantity: 1 Electronic Signature(s) Signed: 08/02/2018 11:58:16 PM By: Worthy Keeler PA-C Entered By: Worthy Keeler on 08/02/2018 23:35:14

## 2018-08-07 LAB — JAK2 GENOTYPR

## 2018-08-09 LAB — MPN W/HYPEREOSINOPHILIA FISH
MPN CELLS ANALYZED: 100
MPN Cells Counted: 100

## 2018-08-14 LAB — BCR ABL1 FISH (GENPATH)

## 2018-08-15 ENCOUNTER — Ambulatory Visit: Payer: Medicare Other | Admitting: Physician Assistant

## 2018-08-22 ENCOUNTER — Telehealth: Payer: Self-pay | Admitting: *Deleted

## 2018-08-22 NOTE — Telephone Encounter (Signed)
[place]. Patient is resident at this time. Informed them that Dr. Irene Limbo has results to review with patient. Per Marzetta Board, nurse working with her today, [facility] is not transporting patients anywhere unless life or death. They want to cancel patient's appt on 3/25 to see Dr. Irene Limbo. They asked that patient be contacted directly [phone] (M) to review results and/or that results can be faxed to NP at facility, [facility]. [phone].

## 2018-08-23 ENCOUNTER — Inpatient Hospital Stay: Payer: Medicare Other | Admitting: Hematology

## 2018-08-25 ENCOUNTER — Telehealth: Payer: Self-pay | Admitting: *Deleted

## 2018-08-25 NOTE — Telephone Encounter (Signed)
Contacted by [facility], NP for [facility], where patient lives. [facility] # [phone].  [facility] had to cancel pt's appt on 3/25 due to not transporting patients out of facility unless emergent. States patient is anxious regarding her tests would like to know her results. Asked NP if she could speak with patient to find out if she'd like to make a phone appt with [facility] to review results. NP spoke with patient while on phone with this RN and patient stated she could make appt or someone can call with results.  Patient's mobile phone number in chart is incorrect. Correct number [phone] (chart updated) Per [facility], contacted patient with following information: please inform patient: genetic tests and blood tests do not show any signs of obvious leukemia. Changes in blood counts likely due to recent infections and medication for gout (allopurinol), but patient does not need to stop medicine. [facility] will f/u at appt in 3-4 months.  Patient verbalized understanding and expressed appreciation for call.

## 2018-08-31 ENCOUNTER — Telehealth: Payer: Self-pay | Admitting: Hematology

## 2018-08-31 NOTE — Telephone Encounter (Signed)
R/s appt per 3/27 sch message - rehab center aware of new appt date and time

## 2018-12-20 ENCOUNTER — Inpatient Hospital Stay: Payer: Medicare Other | Admitting: Hematology

## 2018-12-20 ENCOUNTER — Telehealth: Payer: Self-pay | Admitting: *Deleted

## 2018-12-20 ENCOUNTER — Inpatient Hospital Stay: Payer: Medicare Other

## 2018-12-20 NOTE — Telephone Encounter (Signed)
Contacted by Lenna Sciara at [facility] [phone]) asking to reschedule patient's appt today because patient did not want to get out. Patient's appt is noted to be a Phone appt today. Once staff at [facility] informed, was given following number to call [phone] for appt (supervisor's phone)

## 2018-12-20 NOTE — Telephone Encounter (Signed)
Per Dr. Irene Limbo, contact patient with following information:after review of past labs, he notes low likelihood of bone marrow disease. This appt was to review labs and since patient did not want to come out to come get labs (lives in [place] care facility), unable to review them. He encourages her to follow her care with her PCP and can return to see him if PCP recommends. Gave information to [employer] Scientist, clinical (histocompatibility and immunogenetics)) and patient by phone.

## 2018-12-25 NOTE — Progress Notes (Signed)
This encounter was created in error - please disregard.

## 2019-07-05 ENCOUNTER — Encounter: Payer: Medicare Other | Attending: Physician Assistant | Admitting: Physician Assistant

## 2019-07-05 ENCOUNTER — Other Ambulatory Visit: Payer: Self-pay

## 2019-07-05 DIAGNOSIS — I132 Hypertensive heart and chronic kidney disease with heart failure and with stage 5 chronic kidney disease, or end stage renal disease: Secondary | ICD-10-CM | POA: Diagnosis not present

## 2019-07-05 DIAGNOSIS — L89153 Pressure ulcer of sacral region, stage 3: Secondary | ICD-10-CM | POA: Insufficient documentation

## 2019-07-05 DIAGNOSIS — I5042 Chronic combined systolic (congestive) and diastolic (congestive) heart failure: Secondary | ICD-10-CM | POA: Diagnosis not present

## 2019-07-05 DIAGNOSIS — H409 Unspecified glaucoma: Secondary | ICD-10-CM | POA: Diagnosis not present

## 2019-07-05 DIAGNOSIS — I482 Chronic atrial fibrillation, unspecified: Secondary | ICD-10-CM | POA: Diagnosis not present

## 2019-07-05 DIAGNOSIS — E1122 Type 2 diabetes mellitus with diabetic chronic kidney disease: Secondary | ICD-10-CM | POA: Diagnosis not present

## 2019-07-05 DIAGNOSIS — N186 End stage renal disease: Secondary | ICD-10-CM | POA: Diagnosis not present

## 2019-07-05 DIAGNOSIS — E11622 Type 2 diabetes mellitus with other skin ulcer: Secondary | ICD-10-CM | POA: Diagnosis not present

## 2019-07-05 NOTE — Progress Notes (Signed)
[name], [name] ([id number]) Visit Report for 07/05/2019 Abuse/Suicide Risk Screen Details Patient Name: Sarah Colon. Date of Service: 07/05/2019 2:15 PM Medical Record Number: [id number] Patient Account Number: [id number] Date of Birth/Sex: Dec 19, 1945 (74 y.o. F) Treating RN: Montey Hora Primary Care [place] Severs: Jules Husbands Other Clinician: Referring Ciarah Peace: Jules Husbands Treating Joni Norrod/Extender: Melburn Hake, HOYT Weeks in Treatment: 0 Abuse/Suicide Risk Screen Items Answer ABUSE RISK SCREEN: Has anyone close to you tried to hurt or harm you recentlyo No Do you feel uncomfortable with anyone in your familyo No Has anyone forced you do things that you didnot want to doo No Electronic Signature(s) Signed: 07/05/2019 4:59:12 PM By: Montey Hora Entered By: Montey Hora on 07/05/2019 14:44:30 Sarah Colon, [name] ([id number]) -------------------------------------------------------------------------------- Activities of Daily Living Details Patient Name: Sarah Reach D. Date of Service: 07/05/2019 2:15 PM Medical Record Number: [id number] Patient Account Number: [id number] Date of Birth/Sex: [birth year] (74 y.o. F) Treating RN: Montey Hora Primary Care Mosella Kasa: Jules Husbands Other Clinician: Referring Vernessa Likes: Jules Husbands Treating Gerasimos Plotts/Extender: Melburn Hake, HOYT Weeks in Treatment: 0 Activities of Daily Living Items Answer Activities of Daily Living (Please select one for each item) Drive Automobile Not Able Take Medications Need Assistance Use Telephone Need Assistance Care for Appearance Need Assistance Use Toilet Need Assistance Bath / Shower Need Assistance Dress Self Need Assistance Feed Self Completely Able Walk Not Able Get In / Out Bed Need Assistance Housework Not Able Prepare Meals Not Able Handle Money Need Assistance Shop for Self Not Able Electronic Signature(s) Signed: 07/05/2019 4:59:12 PM By: Montey Hora Entered By: Montey Hora on 07/05/2019 14:45:04 Sarah Colon ([id number]) -------------------------------------------------------------------------------- Education Screening Details Patient Name: Sarah Reach D. Date of Service: 07/05/2019 2:15 PM Medical Record Number: [id number] Patient Account Number: [id number] Date of Birth/Sex: 02/11/1946 (74 y.o. F) Treating RN: Montey Hora Primary Care Corie Vavra: Jules Husbands Other Clinician: Referring Raniya Golembeski: Jules Husbands Treating Zachory Mangual/Extender: Sharalyn Ink in Treatment: 0 Primary Learner Assessed: Patient Learning Preferences/Education Level/Primary Language Learning Preference: Explanation, Demonstration Highest Education Level: High School Preferred Language: English Cognitive Barrier Language Barrier: No Translator Needed: No Memory Deficit: No Emotional Barrier: No Cultural/Religious Beliefs Affecting Medical Care: No Physical Barrier Impaired Vision: No Impaired Hearing: No Decreased Hand dexterity: No Knowledge/Comprehension Knowledge Level: Medium Comprehension Level: Medium Ability to understand written Medium instructions: Ability to understand verbal Medium instructions: Motivation Anxiety Level: Calm Cooperation: Cooperative Education Importance: Acknowledges Need Interest in Health Problems: Asks Questions Perception: Coherent Willingness to Engage in Self- Medium Management Activities: Readiness to Engage in Self- Medium Management Activities: Electronic Signature(s) Signed: 07/05/2019 4:59:12 PM By: Montey Hora Entered By: Montey Hora on 07/05/2019 14:45:42 Sarah Colon Kitchen ([id number]) -------------------------------------------------------------------------------- Fall Risk Assessment Details Patient Name: Sarah Reach D. Date of Service: 07/05/2019 2:15 PM Medical Record Number: [id number] Patient Account Number: [id number] Date of Birth/Sex: 08/20/1945 (74 y.o. F) Treating RN:  Montey Hora Primary Care Willis Holquin: Jules Husbands Other Clinician: Referring Travus Oren: Jules Husbands Treating Madysen Faircloth/Extender: Melburn Hake, HOYT Weeks in Treatment: 0 Fall Risk Assessment Items Have you had 2 or more falls in the last 12 monthso 0 No Have you had any fall that resulted in injury in the last 12 monthso 0 No FALLS RISK SCREEN History of falling - immediate or within 3 months 0 No Secondary diagnosis (Do you have 2 or more medical diagnoseso) 0 No Ambulatory aid None/bed rest/wheelchair/nurse 0 Yes Crutches/cane/walker 0 No Furniture 0 No Intravenous therapy Access/Saline/Heparin Lock 0 No Gait/Transferring Normal/ bed rest/ wheelchair 0 Yes Weak (  short steps with or without shuffle, stooped but able to lift head while 0 No walking, may seek support from furniture) Impaired (short steps with shuffle, may have difficulty arising from chair, head 0 No down, impaired balance) Mental Status Oriented to own ability 0 Yes Electronic Signature(s) Signed: 07/05/2019 4:59:12 PM By: Montey Hora Entered By: Montey Hora on 07/05/2019 14:46:03 Sarah Colon, [name] ([id number]) -------------------------------------------------------------------------------- Foot Assessment Details Patient Name: Sarah Reach D. Date of Service: 07/05/2019 2:15 PM Medical Record Number: [id number] Patient Account Number: [id number] Date of Birth/Sex: 1945-12-20 (74 y.o. F) Treating RN: Montey Hora Primary Care [place] Gamero: Jules Husbands Other Clinician: Referring Miyo Aina: Jules Husbands Treating Roscoe Witts/Extender: Melburn Hake, HOYT Weeks in Treatment: 0 Foot Assessment Items Site Locations + = Sensation present, - = Sensation absent, C = Callus, U = Ulcer R = Redness, W = Warmth, M = Maceration, PU = Pre-ulcerative lesion F = Fissure, S = Swelling, D = Dryness Assessment Right: Left: Other Deformity: No No Prior Foot Ulcer: No No Prior Amputation: No No Charcot Joint: No  No Ambulatory Status: Non-ambulatory Assistance Device: Wheelchair GaitEnergy manager) Signed: 07/05/2019 4:59:12 PM By: Montey Hora Entered By: Montey Hora on 07/05/2019 14:46:45 Sarah Colon, [name] ([id number]) -------------------------------------------------------------------------------- Nutrition Risk Screening Details Patient Name: Sarah Reach D. Date of Service: 07/05/2019 2:15 PM Medical Record Number: [id number] Patient Account Number: [id number] Date of Birth/Sex: 1946/01/02 (74 y.o. F) Treating RN: Montey Hora Primary Care Jalin Alicea: Jules Husbands Other Clinician: Referring Sua Spadafora: Jules Husbands Treating Aundraya Dripps/Extender: Melburn Hake, HOYT Weeks in Treatment: 0 Height (in): 59 Weight (lbs): 112 Body Mass Index (BMI): 22.6 Nutrition Risk Screening Items Score Screening NUTRITION RISK SCREEN: I have an illness or condition that made me change the kind and/or amount of 0 No food I eat I eat fewer than two meals per day 0 No I eat few fruits and vegetables, or milk products 0 No I have three or more drinks of beer, liquor or wine almost every day 0 No I have tooth or mouth problems that make it hard for me to eat 0 No I don't always have enough money to buy the food I need 0 No I eat alone most of the time 0 No I take three or more different prescribed or over-the-counter drugs a day 1 Yes Without wanting to, I have lost or gained 10 pounds in the last six months 0 No I am not always physically able to shop, cook and/or feed myself 0 No Nutrition Protocols Good Risk Protocol 0 No interventions needed Moderate Risk Protocol High Risk Proctocol Risk Level: Good Risk Score: 1 Electronic Signature(s) Signed: 07/05/2019 4:59:12 PM By: Montey Hora Entered By: Montey Hora on 07/05/2019 14:46:23

## 2019-07-05 NOTE — Progress Notes (Signed)
NAVI, EWTON ([id number]) Visit Report for 07/05/2019 Allergy List Details Patient Name: Sarah Colon. Date of Service: 07/05/2019 2:15 PM Medical Record Number: [id number] Patient Account Number: [id number] Date of Birth/Sex: 08-Sep-1945 (74 y.o. F) Treating RN: Montey Hora Primary Care Takisha Pelle: Jules Husbands Other Clinician: Referring Reubin Bushnell: Jules Husbands Treating Labrenda Lasky/Extender: Melburn Hake, HOYT Weeks in Treatment: 0 Allergies Active Allergies codeine Allergy Notes Electronic Signature(s) Signed: 07/05/2019 4:59:12 PM By: Montey Hora Entered By: Montey Hora on 07/05/2019 14:41:54 Heward, Colon DMarland Kitchen ([id number]) -------------------------------------------------------------------------------- Arrival Information Details Patient Name: Sarah Reach D. Date of Service: 07/05/2019 2:15 PM Medical Record Number: [id number] Patient Account Number: [id number] Date of Birth/Sex: [birth year] (74 y.o. F) Treating RN: Army Melia Primary Care Allee Busk: Jules Husbands Other Clinician: Referring Ravan Schlemmer: Jules Husbands Treating Irlene Crudup/Extender: Melburn Hake, HOYT Weeks in Treatment: 0 Visit Information Patient Arrived: Wheel Chair Arrival Time: 14:26 Accompanied By: self Transfer Assistance: Hoyer Lift Patient Identification Verified: Yes Secondary Verification Process Completed: Yes History Since Last Visit Added or deleted any medications: No Any new allergies or adverse reactions: No Had a fall or experienced change in activities of daily living that may affect risk of falls: No Signs or symptoms of abuse/neglect since last visito No Hospitalized since last visit: No Implantable device outside of the clinic excluding cellular tissue based products placed in the center since last visit: No Has Dressing in Place as Prescribed: Yes Electronic Signature(s) Signed: 07/05/2019 4:28:06 PM By: Lorine Bears RCP, RRT, CHT Entered By: Lorine Bears on 07/05/2019 14:26:34 Weekley, Sarah Colon ([id number]) -------------------------------------------------------------------------------- Clinic Level of Care Assessment Details Patient Name: Sarah Reach D. Date of Service: 07/05/2019 2:15 PM Medical Record Number: [id number] Patient Account Number: [id number] Date of Birth/Sex: 30-Jun-1945 (74 y.o. F) Treating RN: Army Melia Primary Care Michelangelo Rindfleisch: Jules Husbands Other Clinician: Referring Amirrah Quigley: Jules Husbands Treating Shalini Mair/Extender: Melburn Hake, HOYT Weeks in Treatment: 0 Clinic Level of Care Assessment Items TOOL 2 Quantity Score []  - Use when only an EandM is performed on the INITIAL visit 0 ASSESSMENTS - Nursing Assessment / Reassessment X - General Physical Exam (combine w/ comprehensive assessment (listed just below) when 1 20 performed on new pt. evals) X- 1 25 Comprehensive Assessment (HX, ROS, Risk Assessments, Wounds Hx, etc.) ASSESSMENTS - Wound and Skin Assessment / Reassessment X - Simple Wound Assessment / Reassessment - one wound 1 5 []  - 0 Complex Wound Assessment / Reassessment - multiple wounds []  - 0 Dermatologic / Skin Assessment (not related to wound area) ASSESSMENTS - Ostomy and/or Continence Assessment and Care []  - Incontinence Assessment and Management 0 []  - 0 Ostomy Care Assessment and Management (repouching, etc.) PROCESS - Coordination of Care X - Simple Patient / Family Education for ongoing care 1 15 []  - 0 Complex (extensive) Patient / Family Education for ongoing care X- 1 10 Staff obtains Programmer, systems, Records, Test Results / Process Orders []  - 0 Staff telephones HHA, Nursing Homes / Clarify orders / etc []  - 0 Routine Transfer to another Facility (non-emergent condition) []  - 0 Routine Hospital Admission (non-emergent condition) X- 1 15 New Admissions / Biomedical engineer / Ordering NPWT, Apligraf, etc. []  - 0 Emergency Hospital Admission (emergent  condition) X- 1 10 Simple Discharge Coordination []  - 0 Complex (extensive) Discharge Coordination PROCESS - Special Needs []  - Pediatric / Minor Patient Management 0 []  - 0 Isolation Patient Management Dross, [name] ([id number]) []  - 0 Hearing / Language / Visual special needs []  - 0 Assessment of Community assistance (  transportation, D/C planning, etc.) []  - 0 Additional assistance / Altered mentation []  - 0 Support Surface(s) Assessment (bed, cushion, seat, etc.) INTERVENTIONS - Wound Cleansing / Measurement X - Wound Imaging (photographs - any number of wounds) 1 5 []  - 0 Wound Tracing (instead of photographs) X- 1 5 Simple Wound Measurement - one wound []  - 0 Complex Wound Measurement - multiple wounds X- 1 5 Simple Wound Cleansing - one wound []  - 0 Complex Wound Cleansing - multiple wounds INTERVENTIONS - Wound Dressings []  - Small Wound Dressing one or multiple wounds 0 X- 1 15 Medium Wound Dressing one or multiple wounds []  - 0 Large Wound Dressing one or multiple wounds []  - 0 Application of Medications - injection INTERVENTIONS - Miscellaneous []  - External ear exam 0 []  - 0 Specimen Collection (cultures, biopsies, blood, body fluids, etc.) []  - 0 Specimen(s) / Culture(s) sent or taken to Lab for analysis []  - 0 Patient Transfer (multiple staff / Civil Service fast streamer / Similar devices) []  - 0 Simple Staple / Suture removal (25 or less) []  - 0 Complex Staple / Suture removal (26 or more) []  - 0 Hypo / Hyperglycemic Management (close monitor of Blood Glucose) []  - 0 Ankle / Brachial Index (ABI) - do not check if billed separately Has the patient been seen at the hospital within the last three years: Yes Total Score: 130 Level Of Care: New/Established - Level 4 Electronic Signature(s) Unsigned Entered By: Army Melia on 07/05/2019 16:47:33 Signature(s): Date(s): Sandoz, Sarah Colon  ([id number]) -------------------------------------------------------------------------------- Encounter Discharge Information Details Patient Name: Sarah Colon. Date of Service: 07/05/2019 2:15 PM Medical Record Number: [id number] Patient Account Number: [id number] Date of Birth/Sex: 12/30/1945 (74 y.o. F) Treating RN: Army Melia Primary Care Ramond Darnell: Jules Husbands Other Clinician: Referring Tylik Treese: Jules Husbands Treating Loran Fleet/Extender: Melburn Hake, HOYT Weeks in Treatment: 0 Encounter Discharge Information Items Discharge Condition: Stable Ambulatory Status: Wheelchair Discharge Destination: Skilled Nursing Facility Telephoned: No Orders Sent: Yes Transportation: Private Auto Accompanied By: self Schedule Follow-up Appointment: Yes Clinical Summary of Care: Electronic Signature(s) Signed: 07/05/2019 4:32:43 PM By: Army Melia Entered By: Army Melia on 07/05/2019 15:17:11 Sather, Sarah Colon ([id number]) -------------------------------------------------------------------------------- Lower Extremity Assessment Details Patient Name: Sarah Reach D. Date of Service: 07/05/2019 2:15 PM Medical Record Number: [id number] Patient Account Number: [id number] Date of Birth/Sex: Sep 08, 1945 (74 y.o. F) Treating RN: Montey Hora Primary Care Keiarra Charon: Jules Husbands Other Clinician: Referring Cecily Lawhorne: Jules Husbands Treating Tomasz Steeves/Extender: Melburn Hake, HOYT Weeks in Treatment: 0 Electronic Signature(s) Signed: 07/05/2019 4:59:12 PM By: Montey Hora Entered By: Montey Hora on 07/05/2019 14:41:12 Crites, Asako D. ([id number]) -------------------------------------------------------------------------------- Multi Wound Chart Details Patient Name: Sarah Reach D. Date of Service: 07/05/2019 2:15 PM Medical Record Number: [id number] Patient Account Number: [id number] Date of Birth/Sex: [birth year] (74 y.o. F) Treating RN: Army Melia Primary Care Lehi Phifer: Jules Husbands Other Clinician: Referring Irina Okelly: Jules Husbands Treating Emmanuelle Hibbitts/Extender: Melburn Hake, HOYT Weeks in Treatment: 0 Vital Signs Height(in): 59 Pulse(bpm): 65 Weight(lbs): 112 Blood Pressure(mmHg): 127/84 Body Mass Index(BMI): 23 Temperature(F): 98.0 Respiratory Rate 16 (breaths/min): Photos: [N/A:N/A] Wound Location: Left Sacrum N/A N/A Wounding Event: Pressure Injury N/A N/A Primary Etiology: Pressure Ulcer N/A N/A Comorbid History: Cataracts, Glaucoma, N/A N/A Arrhythmia, Congestive Heart Failure, Hypertension, Type II Diabetes, End Stage Renal Disease, History of pressure wounds Date Acquired: 07/01/2018 N/A N/A Weeks of Treatment: 0 N/A N/A Wound Status: Open N/A N/A Measurements L x W x D 2.4x1x1.4 N/A N/A (cm) Area (cm) : 1.885 N/A N/A Volume (cm) :  2.639 N/A N/A Position 1 (o'clock): 9 Maximum Distance 1 (cm): 2.5 Tunneling: Yes N/A N/A Classification: Category/Stage III N/A N/A Exudate Amount: Medium N/A N/A Exudate Type: Serous N/A N/A Exudate Color: amber N/A N/A Wound Margin: Epibole N/A N/A Granulation Amount: Medium (34-66%) N/A N/A Granulation Quality: Red N/A N/A Necrotic Amount: Medium (34-66%) N/A N/A Exposed Structures: Fat Layer (Subcutaneous N/A N/A Tissue) Exposed: Yes George, Peityn D. ([id number]) Fascia: No Tendon: No Muscle: No Joint: No Bone: No Epithelialization: Large (67-100%) N/A N/A Treatment Notes Electronic Signature(s) Signed: 07/05/2019 4:32:43 PM By: Army Melia Entered By: Army Melia on 07/05/2019 15:07:25 Nicasio, Sarah Colon ([id number]) -------------------------------------------------------------------------------- [place] Details Patient Name: Sarah Reach D. Date of Service: 07/05/2019 2:15 PM Medical Record Number: [id number] Patient Account Number: [id number] Date of Birth/Sex: 06-21-1945 (74 y.o. F) Treating RN: Army Melia Primary Care Shail Urbas: Jules Husbands Other  Clinician: Referring Anouk Critzer: Jules Husbands Treating Imri Lor/Extender: Melburn Hake, HOYT Weeks in Treatment: 0 Active Inactive Abuse / Safety / Falls / Self Care Management Nursing Diagnoses: Potential for injury related to transfers Goals: Patient/caregiver will verbalize/demonstrate measures taken to improve the patient's personal safety Date Initiated: 07/05/2019 Target Resolution Date: 08/02/2019 Goal Status: Active Interventions: Provide education on fall prevention Notes: Orientation to the Wound Care Program Nursing Diagnoses: Knowledge deficit related to the wound healing center program Goals: Patient/caregiver will verbalize understanding of the [place] Program Date Initiated: 07/05/2019 Target Resolution Date: 08/02/2019 Goal Status: Active Interventions: Provide education on orientation to the wound center Notes: Pressure Nursing Diagnoses: Knowledge deficit related to management of pressures ulcers Goals: Patient/caregiver will verbalize risk factors for pressure ulcer development Date Initiated: 07/05/2019 Target Resolution Date: 08/02/2019 Goal Status: Active Interventions: Assess offloading mechanisms upon admission and as needed Ranum, Suzannah D. ([id number]) Notes: Wound/Skin Impairment Nursing Diagnoses: Impaired tissue integrity Goals: Ulcer/skin breakdown will have a volume reduction of 30% by week 4 Date Initiated: 07/05/2019 Target Resolution Date: 08/02/2019 Goal Status: Active Interventions: Assess ulceration(s) every visit Notes: Electronic Signature(s) Signed: 07/05/2019 4:32:43 PM By: Army Melia Entered By: Army Melia on 07/05/2019 15:07:07 Gangl, Safiyah D. ([id number]) -------------------------------------------------------------------------------- Pain Assessment Details Patient Name: Sarah Reach D. Date of Service: 07/05/2019 2:15 PM Medical Record Number: [id number] Patient Account Number: [id number] Date of Birth/Sex:  May 13, 1946 (74 y.o. F) Treating RN: Army Melia Primary Care Jennilyn Esteve: Jules Husbands Other Clinician: Referring Atlee Kluth: Jules Husbands Treating Elio Haden/Extender: Melburn Hake, HOYT Weeks in Treatment: 0 Active Problems Location of Pain Severity and Description of Pain Patient Has Paino Yes Site Locations Rate the pain. Current Pain Level: 9 Pain Management and Medication Current Pain Management: Electronic Signature(s) Signed: 07/05/2019 4:28:06 PM By: Lorine Bears RCP, RRT, CHT Signed: 07/05/2019 4:32:43 PM By: Army Melia Entered By: Lorine Bears on 07/05/2019 14:26:56 Yadav, Sarah Colon ([id number]) -------------------------------------------------------------------------------- Patient/Caregiver Education Details Patient Name: Sarah Reach D. Date of Service: 07/05/2019 2:15 PM Medical Record Number: [id number] Patient Account Number: [id number] Date of Birth/Gender: 09-Oct-1945 (74 y.o. F) Treating RN: Army Melia Primary Care Physician: Jules Husbands Other Clinician: Referring Physician: Jules Husbands Treating Physician/Extender: Sharalyn Ink in Treatment: 0 Education Assessment Education Provided To: Patient Education Topics Provided Wound/Skin Impairment: Handouts: Caring for Your Ulcer Methods: Demonstration, Explain/Verbal Responses: State content correctly Electronic Signature(s) Signed: 07/05/2019 4:32:43 PM By: Army Melia Entered By: Army Melia on 07/05/2019 15:16:38 Lipke, Alyra D. ([id number]) -------------------------------------------------------------------------------- Wound Assessment Details Patient Name: Sarah Reach D. Date of Service: 07/05/2019 2:15 PM Medical Record Number: [id number] Patient Account Number: [id number] Date  of Birth/Sex: 09/29/1945 (74 y.o. F) Treating RN: Montey Hora Primary Care Wrenna Saks: Jules Husbands Other Clinician: Referring Phyllicia Dudek: Jules Husbands Treating  Zuzu Befort/Extender: Melburn Hake, HOYT Weeks in Treatment: 0 Wound Status Wound Number: 2 Primary Pressure Ulcer Etiology: Wound Location: Left Sacrum Wound Open Wounding Event: Pressure Injury Status: Date Acquired: 07/01/2018 Comorbid Cataracts, Glaucoma, Arrhythmia, Congestive Weeks Of Treatment: 0 History: Heart Failure, Hypertension, Type II Diabetes, Clustered Wound: No End Stage Renal Disease, History of pressure wounds Photos Wound Measurements Length: (cm) 2.4 % Reduction in Width: (cm) 1 % Reduction in Depth: (cm) 1.4 Epithelializati Area: (cm) 1.885 Tunneling: Volume: (cm) 2.639 Position (o Maximum Dist Area: Volume: on: Large (67-100%) Yes 'clock): 9 ance: (cm) 2.5 Undermining: No Wound Description Classification: Category/Stage III Foul Odor After Wound Margin: Epibole Slough/Fibrino Exudate Amount: Medium Exudate Type: Serous Exudate Color: amber Cleansing: No Yes Wound Bed Granulation Amount: Medium (34-66%) Exposed Structure Granulation Quality: Red Fascia Exposed: No Necrotic Amount: Medium (34-66%) Fat Layer (Subcutaneous Tissue) Exposed: Yes Necrotic Quality: Adherent Slough Tendon Exposed: No Muscle Exposed: No Joint Exposed: No Puglia, Aubreyana D. ([id number]) Bone Exposed: No Treatment Notes Wound #2 (Left Sacrum) Notes h. blue, BFD Electronic Signature(s) Signed: 07/05/2019 4:59:12 PM By: Montey Hora Entered By: Montey Hora on 07/05/2019 14:57:02 [place], Shivonne D. ([id number]) -------------------------------------------------------------------------------- Vitals Details Patient Name: Sarah Reach D. Date of Service: 07/05/2019 2:15 PM Medical Record Number: [id number] Patient Account Number: [id number] Date of Birth/Sex: [birth year] (74 y.o. F) Treating RN: Army Melia Primary Care Vaughan Garfinkle: Jules Husbands Other Clinician: Referring Nyeem Stoke: Jules Husbands Treating Barnet Benavides/Extender: Melburn Hake, HOYT Weeks in Treatment:  0 Vital Signs Time Taken: 14:28 Temperature (F): 98.0 Height (in): 59 Pulse (bpm): 65 Source: Stated Respiratory Rate (breaths/min): 16 Weight (lbs): 112 Blood Pressure (mmHg): 127/84 Source: Stated Reference Range: 80 - 120 mg / dl Body Mass Index (BMI): 22.6 Electronic Signature(s) Signed: 07/05/2019 4:28:06 PM By: Lorine Bears RCP, RRT, CHT Entered By: Lorine Bears on 07/05/2019 14:28:24

## 2019-07-05 NOTE — Progress Notes (Signed)
NOA, GALVAO ([id number]) Visit Report for 07/05/2019 Chief Complaint Document Details Patient Name: Sarah Colon, Sarah Colon. Date of Service: 07/05/2019 2:15 PM Medical Record Number: [id number] Patient Account Number: [id number] Date of Birth/Sex: 02/24/1946 (74 y.o. F) Treating RN: Army Melia Primary Care Provider: Jules Husbands Other Clinician: Referring Provider: Jules Husbands Treating Provider/Extender: Melburn Hake, Aziah Kaiser Weeks in Treatment: 0 Information Obtained from: Patient Chief Complaint Sacral Pressure ulcer on the left Electronic Signature(s) Signed: 07/05/2019 3:03:05 PM By: Worthy Keeler PA-C Entered By: Worthy Keeler on 07/05/2019 15:03:05 Berrian, Sarah Colon Kitchen ([id number]) -------------------------------------------------------------------------------- HPI Details Patient Name: Sarah Reach D. Date of Service: 07/05/2019 2:15 PM Medical Record Number: [id number] Patient Account Number: [id number] Date of Birth/Sex: [birth year] (74 y.o. 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 She states that this initially occurred about a year ago closed and then reopened several weeks ago though she does not have a specific timeframe for this. Unfortunately she states there is some discomfort at this time. She does have a history of atrial fibrillation, congestive heart failure, hypertension, diabetes mellitus type II, and her most recent hemoglobin A1c was 6.8 on 05/30/18. She did have a CT scan of the sacral region on 06/19/18 this was negative for osteomyelitis which is excellent news. Patient was on doxycycline two weeks ago and it appears based on record review that she also is  currently on Cipro. There is some question about whether not she has a urinary tract infection. She did have a CBC performed which showed a white blood cell count at 20 which again indicates some kind of infection in my pinion. Again upon inspection today I seriously doubt that this is an infection secondary to the wound. I seen no signs of infection here. No fevers, chills, nausea, or vomiting noted at this time. The patient in fact states she feels really well. This again I feel like leads credence to the fact that she may have a urinary tract infection. 3/3/20Patient's wound bed currently shows evidence of great granulation at this time and does not appear to be signs of infection currently which is excellent news. It's actually fairly encouraging the wound is not doing any worse at this point. I am pleased with he address it has made. Readmission: 07/05/2019 on evaluation today patient presents for readmission she previously has been seen here in the clinic she is a referral from [facility] and rehabilitation center in [place]. Fortunately there is no signs of the patient having any severe infection based on what we are seeing today which is great news she has not had osteomyelitis even in the past when we have seen her. With that being said she does tell me that she unfortunately spends the majority of her time in bed sitting up which I think is putting pressure on the region which is why this does get smaller than reopens and she is really not able to get this to heal and close appropriately. Fortunately she is not having any significant pain. She tells me she does not really go outside of her room due to Covid there are pretty much isolated in the room she has been very bored over the past year. Electronic Signature(s) Signed: 07/05/2019 3:29:27 PM By: Worthy Keeler  PA-C Entered By: Worthy Keeler on 07/05/2019 15:29:26 Nulty, [name]  ([id number]) -------------------------------------------------------------------------------- Otelia Sergeant TISS Details Patient Name: Sarah Reach D. Date of Service: 07/05/2019 2:15 PM Medical Record Number: [id number] Patient Account Number: [id number] Date of Birth/Sex: [birth year] (74 y.o. F) Treating RN: Army Melia Primary Care Provider: Jules Husbands Other Clinician: Referring Provider: Jules Husbands Treating Provider/Extender: Melburn Hake, Ledon Weihe Weeks in Treatment: 0 Procedure Performed for: Wound #2 Left Sacrum Performed By: Physician Emilio Math., PA-C Post Procedure Diagnosis Same as Pre-procedure Electronic Signature(s) Signed: 07/05/2019 4:32:43 PM By: Army Melia Entered By: Army Melia on 07/05/2019 15:16:28 Hassey, [name] ([id number]) -------------------------------------------------------------------------------- Physical Exam Details Patient Name: Sarah Reach D. Date of Service: 07/05/2019 2:15 PM Medical Record Number: [id number] Patient Account Number: [id number] Date of Birth/Sex: August 31, 1945 (74 y.o. F) Treating RN: Army Melia Primary Care Provider: Jules Husbands Other Clinician: Referring Provider: Jules Husbands Treating Provider/Extender: Melburn Hake, [name] Weeks in Treatment: 0 Constitutional sitting or standing blood pressure is within target range for patient.. pulse regular and within target range for patient.Marland Kitchen respirations regular, non-labored and within target range for patient.Marland Kitchen temperature within target range for patient.. Well- nourished and well-hydrated in no acute distress. Respiratory normal breathing without difficulty. Psychiatric this patient is able to make decisions and demonstrates good insight into disease process. Alert and Oriented x 3. pleasant and cooperative. Notes Patient's wound currently showed signs of some undermining there was also some hyper granular tissue on the right lateral border from around to  to 6:00 where I did perform some chemical cauterization with silver nitrate to help with some of the hyper granular tissue. She tolerated that today without complication I am hopeful this will help with hyper granulation and commendation with switching to the [facility] dressing for packing. Electronic Signature(s) Signed: 07/05/2019 3:30:04 PM By: Worthy Keeler PA-C Entered By: Worthy Keeler on 07/05/2019 15:30:03 Leifheit, [name] ([id number]) -------------------------------------------------------------------------------- Physician Orders Details Patient Name: Sarah Reach D. Date of Service: 07/05/2019 2:15 PM Medical Record Number: [id number] Patient Account Number: [id number] Date of Birth/Sex: [birth year] (74 y.o. F) Treating RN: Army Melia Primary Care Provider: Jules Husbands Other Clinician: Referring Provider: Jules Husbands Treating Provider/Extender: Melburn Hake, Stancil Deisher Weeks in Treatment: 0 Verbal / Phone Orders: No Diagnosis Coding ICD-10 Coding Code Description L89.153 Pressure ulcer of sacral region, stage 3 I10 Essential (primary) hypertension E11.622 Type 2 diabetes mellitus with other skin ulcer I48.20 Chronic atrial fibrillation, unspecified I50.42 Chronic combined systolic (congestive) and diastolic (congestive) heart failure Wound Cleansing Wound #2 Left Sacrum o Clean wound with Normal Saline. - in clinic o May Shower, gently pat wound dry prior to applying new dressing. Skin Barriers/Peri-Wound Care Wound #2 Left Sacrum o Skin Prep Primary Wound Dressing Wound #2 Left Sacrum o Hydrafera Blue Ready Transfer - packed into wound 12-12 8-12o'clock is the deeper undermining that needs to be filled with a single piece hydrafera blue Secondary Dressing o Boardered Foam Dressing Dressing Change Frequency Wound #2 Left Sacrum o Change dressing every day. o Other: - as needed for incontinence Follow-up Appointments Wound #2 Left  Sacrum o Return Appointment in 1 week. Off-Loading Wound #2 Left Sacrum o Mattress - Pt needs air mattress o Turn and reposition every 2 hours Electronic Signature(s) WINDIE, MARASCO ([id number]) Signed: 07/05/2019 4:32:43 PM By: Army Melia Signed: 07/05/2019 5:05:52 PM By: Worthy Keeler PA-C Entered By: Army Melia on 07/05/2019 15:15:45 Romig, Shawniece D. ([id number]) -------------------------------------------------------------------------------- Problem List Details Patient Name: Sarah Reach  D. Date of Service: 07/05/2019 2:15 PM Medical Record Number: [id number] Patient Account Number: [id number] Date of Birth/Sex: Jul 24, 1945 (74 y.o. F) Treating RN: Army Melia Primary Care Provider: Jules Husbands Other Clinician: Referring Provider: Jules Husbands Treating Provider/Extender: Melburn Hake, Darrielle Pflieger Weeks in Treatment: 0 Active Problems ICD-10 Evaluated Encounter Code Description Active Date Today Diagnosis L89.153 Pressure ulcer of sacral region, stage 3 07/05/2019 No Yes I10 Essential (primary) hypertension 07/05/2019 No Yes E11.622 Type 2 diabetes mellitus with other skin ulcer 07/05/2019 No Yes I48.20 Chronic atrial fibrillation, unspecified 07/05/2019 No Yes I50.42 Chronic combined systolic (congestive) and diastolic 10/01/6642 No Yes (congestive) heart failure Inactive Problems Resolved Problems Electronic Signature(s) Signed: 07/05/2019 3:02:54 PM By: Worthy Keeler PA-C Entered By: Worthy Keeler on 07/05/2019 15:02:54 Rhoads, Lucill D. ([id number]) -------------------------------------------------------------------------------- Progress Note Details Patient Name: Sarah Reach D. Date of Service: 07/05/2019 2:15 PM Medical Record Number: [id number] Patient Account Number: [id number] Date of Birth/Sex: [birth year] (74 y.o. 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 She states that this initially occurred about a year ago closed and then reopened several weeks ago though she does not have a specific timeframe for this. Unfortunately she states there is some discomfort at this time. She does have a history of atrial fibrillation, congestive heart failure, hypertension, diabetes mellitus type II, and her most recent hemoglobin A1c was 6.8 on 05/30/18. She did have a CT scan of the sacral region on 06/19/18 this was negative for osteomyelitis which is excellent news. Patient was on doxycycline two weeks ago and it appears based on record review that she also is currently on Cipro. There is some question about whether not she has a urinary tract infection. She did have a CBC performed which showed a white blood cell count at 20 which again indicates some kind of infection in my pinion. Again upon inspection today I seriously doubt that this is an infection secondary to the wound. I seen no signs of infection here. No fevers, chills, nausea, or vomiting noted at this time. The patient in fact states she feels really well. This again I feel like leads credence to the fact that she may have a urinary tract infection. 3/3/20Patient's wound bed currently shows evidence of great granulation at this time and does not appear to be signs of infection currently which is excellent news. It's actually fairly encouraging the wound is not doing any worse at this point. I am pleased with he address it has made. Readmission: 07/05/2019 on evaluation today patient presents for readmission she previously has been seen here in the clinic she is a referral from [facility] and  rehabilitation center in [place]. Fortunately there is no signs of the patient having any severe infection based on what we are seeing today which is great news she has not had osteomyelitis even in the past when we have seen her. With that being said she does tell me that she unfortunately spends the majority of her time in bed sitting up which I think is putting pressure on the region which is why this does get smaller than reopens and she is really not able to get this to heal and  close appropriately. Fortunately she is not having any significant pain. She tells me she does not really go outside of her room due to Covid there are pretty much isolated in the room she has been very bored over the past year. Patient History Information obtained from Patient. Allergies codeine Family History No family history of Cancer, Diabetes, Heart Disease, Hereditary Spherocytosis, Hypertension, Kidney Disease, Lung Disease, Seizures, Stroke, Thyroid Problems, Tuberculosis. Social History Former smoker - 16 hears ago, Marital Status - Widowed, Alcohol Use - Never, Drug Use - No History, Caffeine Use - Moderate. ALLAYA, ABBASI ([id number]) Medical History Eyes Patient has history of Cataracts - removed, Glaucoma Denies history of Optic Neuritis Ear/Nose/Mouth/Throat Denies history of Chronic sinus problems/congestion, Middle ear problems Hematologic/Lymphatic Denies history of Anemia, Hemophilia, Human Immunodeficiency Virus, Lymphedema, Sickle Cell Disease Respiratory Denies history of Aspiration, Asthma, Chronic Obstructive Pulmonary Disease (COPD), Pneumothorax, Sleep Apnea, Tuberculosis Cardiovascular Patient has history of Arrhythmia, Congestive Heart Failure, Hypertension Denies history of Angina, Coronary Artery Disease, Deep Vein Thrombosis, Hypotension, Myocardial Infarction, Peripheral Arterial Disease, Peripheral Venous Disease, Phlebitis, Vasculitis Gastrointestinal Denies  history of Cirrhosis , Colitis, Crohn s, Hepatitis A, Hepatitis B, Hepatitis C Endocrine Patient has history of Type II Diabetes Denies history of Type I Diabetes Genitourinary Patient has history of End Stage Renal Disease - CKD Immunological Denies history of Lupus Erythematosus, Raynaud s, Scleroderma Integumentary (Skin) Patient has history of History of pressure wounds Denies history of History of Burn Musculoskeletal Denies history of Gout, Rheumatoid Arthritis, Osteoarthritis, Osteomyelitis Neurologic Denies history of Dementia, Neuropathy, Quadriplegia, Paraplegia, Seizure Disorder Oncologic Denies history of Received Chemotherapy, Received Radiation Psychiatric Denies history of Anorexia/bulimia, Confinement Anxiety Review of Systems (ROS) Eyes Denies complaints or symptoms of Dry Eyes, Vision Changes, Glasses / Contacts. Ear/Nose/Mouth/Throat Denies complaints or symptoms of Difficult clearing ears, Sinusitis. Hematologic/Lymphatic Denies complaints or symptoms of Bleeding / Clotting Disorders, Human Immunodeficiency Virus. Respiratory Denies complaints or symptoms of Chronic or frequent coughs, Shortness of Breath. Cardiovascular Denies complaints or symptoms of Chest pain, LE edema. Gastrointestinal Denies complaints or symptoms of Frequent diarrhea, Nausea, Vomiting. Endocrine Denies complaints or symptoms of Hepatitis, Thyroid disease, Polydypsia (Excessive Thirst). Genitourinary Complains or has symptoms of Kidney failure/ Dialysis - CKD, Incontinence/dribbling. Integumentary (Skin) Complains or has symptoms of Wounds. Denies complaints or symptoms of Bleeding or bruising tendency, Breakdown, Swelling. Musculoskeletal Denies complaints or symptoms of Muscle Pain, Muscle Weakness. Neurologic Luhman, Lalani D. ([id number]) Denies complaints or symptoms of Numbness/parasthesias, Focal/Weakness. Psychiatric Denies complaints or symptoms of Anxiety,  Claustrophobia. Objective Constitutional sitting or standing blood pressure is within target range for patient.. pulse regular and within target range for patient.Marland Kitchen respirations regular, non-labored and within target range for patient.Marland Kitchen temperature within target range for patient.. Well- nourished and well-hydrated in no acute distress. Vitals Time Taken: 2:28 PM, Height: 59 in, Source: Stated, Weight: 112 lbs, Source: Stated, BMI: 22.6, Temperature: 98.0 F, Pulse: 65 bpm, Respiratory Rate: 16 breaths/min, Blood Pressure: 127/84 mmHg. Respiratory normal breathing without difficulty. Psychiatric this patient is able to make decisions and demonstrates good insight into disease process. Alert and Oriented x 3. pleasant and cooperative. General Notes: Patient's wound currently showed signs of some undermining there was also some hyper granular tissue on the right lateral border from around to to 6:00 where I did perform some chemical cauterization with silver nitrate to help with some of the hyper granular tissue. She tolerated that today without complication I am hopeful this will help with hyper granulation and commendation with  switching to the [facility] dressing for packing. Integumentary (Hair, Skin) Wound #2 status is Open. Original cause of wound was Pressure Injury. The wound is located on the Left Sacrum. The wound measures 2.4cm length x 1cm width x 1.4cm depth; 1.885cm^2 area and 2.639cm^3 volume. There is Fat Layer (Subcutaneous Tissue) Exposed exposed. There is no undermining noted, however, there is tunneling at 9:00 with a maximum distance of 2.5cm. There is a medium amount of serous drainage noted. The wound margin is epibole. There is medium (34-66%) red granulation within the wound bed. There is a medium (34-66%) amount of necrotic tissue within the wound bed including Adherent Slough. Assessment Active Problems ICD-10 Pressure ulcer of sacral region, stage  3 Essential (primary) hypertension Type 2 diabetes mellitus with other skin ulcer Chronic atrial fibrillation, unspecified Chronic combined systolic (congestive) and diastolic (congestive) heart failure Harju, Kelley D. ([id number]) Procedures Wound #2 Pre-procedure diagnosis of Wound #2 is a Pressure Ulcer located on the Left Sacrum . An CHEM CAUT GRANULATION TISS procedure was performed by STONE III, Izzy Courville E., PA-C. Post procedure Diagnosis Wound #2: Same as Pre-Procedure Plan Wound Cleansing: Wound #2 Left Sacrum: Clean wound with Normal Saline. - in clinic May Shower, gently pat wound dry prior to applying new dressing. Skin Barriers/Peri-Wound Care: Wound #2 Left Sacrum: Skin Prep Primary Wound Dressing: Wound #2 Left Sacrum: Hydrafera Blue Ready Transfer - packed into wound 12-12 8-12o'clock is the deeper undermining that needs to be filled with a single piece hydrafera blue Secondary Dressing: Boardered Foam Dressing Dressing Change Frequency: Wound #2 Left Sacrum: Change dressing every day. Other: - as needed for incontinence Follow-up Appointments: Wound #2 Left Sacrum: Return Appointment in 1 week. Off-Loading: Wound #2 Left Sacrum: Mattress - Pt needs air mattress Turn and reposition every 2 hours 1. My suggestion at this time is good to be that we go ahead and initiate treatment with a Hydrofera Blue dressing I think that is good to be appropriate we may have to perform chemical cauterization again with silver nitrate in the future and again that may be something that she needs to come back here to have performed going forward. 2. I would recommend aggressive offloading as well she does not need to be laying on her back specifically sitting up in the bed at a 30 to 45 degree angle that can put pressure specifically on the spot where the ulcer is which may be part of the reason why it reopened and has been giving her trouble. 3. She also does need an air mattress  I discussed with her as well she can definitely have a blanket up underneath between her and the air mattress to prevent that from being too cold although I really never have that is one of the concerns from many patients to be perfectly honest I does not mean it could not be the case. ROMELL, CAVANAH ([id number]) We will see patient back for reevaluation in 1 week here in the clinic. If anything worsens or changes patient will contact our office for additional recommendations. Electronic Signature(s) Signed: 07/05/2019 3:31:02 PM By: Worthy Keeler PA-C Entered By: Worthy Keeler on 07/05/2019 15:31:01 Kisamore, [name] ([id number]) -------------------------------------------------------------------------------- ROS/PFSH Details Patient Name: Sarah Reach D. Date of Service: 07/05/2019 2:15 PM Medical Record Number: [id number] Patient Account Number: [id number] Date of Birth/Sex: 1945/12/24 (74 y.o. F) Treating RN: Montey Hora Primary Care Provider: Jules Husbands Other Clinician: Referring Provider: Jules Husbands Treating Provider/Extender: Melburn Hake, Shyanne Mcclary Weeks in Treatment:  0 Information Obtained From Patient Eyes Complaints and Symptoms: Negative for: Dry Eyes; Vision Changes; Glasses / Contacts Medical History: Positive for: Cataracts - removed; Glaucoma Negative for: Optic Neuritis Ear/Nose/Mouth/Throat Complaints and Symptoms: Negative for: Difficult clearing ears; Sinusitis Medical History: Negative for: Chronic sinus problems/congestion; Middle ear problems Hematologic/Lymphatic Complaints and Symptoms: Negative for: Bleeding / Clotting Disorders; Human Immunodeficiency Virus Medical History: Negative for: Anemia; Hemophilia; Human Immunodeficiency Virus; Lymphedema; Sickle Cell Disease Respiratory Complaints and Symptoms: Negative for: Chronic or frequent coughs; Shortness of Breath Medical History: Negative for: Aspiration; Asthma; Chronic Obstructive  Pulmonary Disease (COPD); Pneumothorax; Sleep Apnea; Tuberculosis Cardiovascular Complaints and Symptoms: Negative for: Chest pain; LE edema Medical History: Positive for: Arrhythmia; Congestive Heart Failure; Hypertension Negative for: Angina; Coronary Artery Disease; Deep Vein Thrombosis; Hypotension; Myocardial Infarction; Peripheral Arterial Disease; Peripheral Venous Disease; Phlebitis; Vasculitis Gastrointestinal Complaints and Symptoms: Negative for: Frequent diarrhea; Nausea; Vomiting Gama, Khristen D. ([id number]) Medical History: Negative for: Cirrhosis ; Colitis; Crohnos; Hepatitis A; Hepatitis B; Hepatitis C Endocrine Complaints and Symptoms: Negative for: Hepatitis; Thyroid disease; Polydypsia (Excessive Thirst) Medical History: Positive for: Type II Diabetes Negative for: Type I Diabetes Time with diabetes: 45years Blood sugar tested every day: Yes Tested : Genitourinary Complaints and Symptoms: Positive for: Kidney failure/ Dialysis - CKD; Incontinence/dribbling Medical History: Positive for: End Stage Renal Disease - CKD Integumentary (Skin) Complaints and Symptoms: Positive for: Wounds Negative for: Bleeding or bruising tendency; Breakdown; Swelling Medical History: Positive for: History of pressure wounds Negative for: History of Burn Musculoskeletal Complaints and Symptoms: Negative for: Muscle Pain; Muscle Weakness Medical History: Negative for: Gout; Rheumatoid Arthritis; Osteoarthritis; Osteomyelitis Neurologic Complaints and Symptoms: Negative for: Numbness/parasthesias; Focal/Weakness Medical History: Negative for: Dementia; Neuropathy; Quadriplegia; Paraplegia; Seizure Disorder Psychiatric Complaints and Symptoms: Negative for: Anxiety; Claustrophobia Medical History: Negative for: Anorexia/bulimia; Confinement Anxiety Immunological Phet[place], Elinda D. ([id number]) Medical History: Negative for: Lupus Erythematosus; Raynaudos;  Scleroderma Oncologic Medical History: Negative for: Received Chemotherapy; Received Radiation HBO Extended History Items Eyes: Eyes: Cataracts Glaucoma Immunizations Pneumococcal Vaccine: Received Pneumococcal Vaccination: Yes Implantable Devices No devices added Family and Social History Cancer: No; Diabetes: No; Heart Disease: No; Hereditary Spherocytosis: No; Hypertension: No; Kidney Disease: No; Lung Disease: No; Seizures: No; Stroke: No; Thyroid Problems: No; Tuberculosis: No; Former smoker - 21 hears ago; Marital Status - Widowed; Alcohol Use: Never; Drug Use: No History; Caffeine Use: Moderate; Financial Concerns: No; Food, Clothing or Shelter Needs: No; Support System Lacking: No; Transportation Concerns: No Electronic Signature(s) Signed: 07/05/2019 4:59:12 PM By: Montey Hora Signed: 07/05/2019 5:05:52 PM By: Worthy Keeler PA-C Entered By: Montey Hora on 07/05/2019 14:43:48 Prochazka, Arizbeth D. ([id number]) -------------------------------------------------------------------------------- SuperBill Details Patient Name: Sarah Reach D. Date of Service: 07/05/2019 Medical Record Number: [id number] Patient Account Number: [id number] Date of Birth/Sex: Sep 30, 1945 (74 y.o. 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, stage 3 I10 Essential (primary) hypertension E11.622 Type 2 diabetes mellitus with other skin ulcer I48.20 Chronic atrial fibrillation, unspecified I50.42 Chronic combined systolic (congestive) and diastolic (congestive) heart failure Facility Procedures CPT4 Code: 91505697 Description: 94801 - CHEM CAUT GRANULATION TISS ICD-10 Diagnosis Description L89.153 Pressure ulcer of sacral region, stage 3 Modifier: Quantity: 1 Physician  Procedures CPT4 Code: 6553748 Description: 99214 - WC PHYS LEVEL 4 - EST PT ICD-10 Diagnosis Description L89.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

## 2019-07-12 ENCOUNTER — Other Ambulatory Visit: Payer: Self-pay

## 2019-07-12 ENCOUNTER — Encounter: Payer: Medicare Other | Admitting: Physician Assistant

## 2019-07-12 DIAGNOSIS — L89153 Pressure ulcer of sacral region, stage 3: Secondary | ICD-10-CM | POA: Diagnosis not present

## 2019-07-12 NOTE — Progress Notes (Signed)
Sarah Colon, Sarah Colon ([id number]) Visit Report for 07/12/2019 Arrival Information Details Patient Name: Sarah Colon, Sarah Colon. Date of Service: 07/12/2019 1:30 PM Medical Record Number: [id number] Patient Account Number: [id number] Date of Birth/Sex: [birth year] (74 y.o. F) Treating RN: Army Melia Primary Care Shannen Vernon: Jules Husbands Other Clinician: Referring Kayveon Lennartz: Jules Husbands Treating Russell Engelstad/Extender: Melburn Hake, HOYT Weeks in Treatment: 1 Visit Information History Since Last Visit Added or deleted any medications: No Patient Arrived: Wheel Chair Any new allergies or adverse reactions: No Arrival Time: 13:24 Had a fall or experienced change in No Accompanied By: self activities of daily living that may affect Transfer Assistance: Manual risk of falls: Patient Identification Verified: Yes Signs or symptoms of abuse/neglect since last visito No Secondary Verification Process Completed: Yes Hospitalized since last visit: No Implantable device outside of the clinic excluding No cellular tissue based products placed in the center since last visit: Has Dressing in Place as Prescribed: Yes Has Compression in Place as Prescribed: Yes Pain Present Now: No Electronic Signature(s) Signed: 07/12/2019 4:36:47 PM By: Lorine Bears RCP, RRT, CHT Entered By: Lorine Bears on 07/12/2019 13:28:37 Lindsley, Sarah Colon ([id number]) -------------------------------------------------------------------------------- Clinic Level of Care Assessment Details Patient Name: Sarah Sarah Colon D. Date of Service: 07/12/2019 1:30 PM Medical Record Number: [id number] Patient Account Number: [id number] Date of Birth/Sex: 03-06-1946 (74 y.o. F) Treating RN: Army Melia Primary Care Rebbecca Osuna: Jules Husbands Other Clinician: Referring Soniya Ashraf: Jules Husbands Treating Shondell Fabel/Extender: Melburn Hake, HOYT Weeks in Treatment: 1 Clinic Level of Care Assessment Items TOOL 4 Quantity  Score []  - Use when only an EandM is performed on FOLLOW-UP visit 0 ASSESSMENTS - Nursing Assessment / Reassessment X - Reassessment of Co-morbidities (includes updates in patient status) 1 10 X- 1 5 Reassessment of Adherence to Treatment Plan ASSESSMENTS - Wound and Skin Assessment / Reassessment []  - Simple Wound Assessment / Reassessment - one wound 0 X- 2 5 Complex Wound Assessment / Reassessment - multiple wounds []  - 0 Dermatologic / Skin Assessment (not related to wound area) ASSESSMENTS - Focused Assessment []  - Circumferential Edema Measurements - multi extremities 0 []  - 0 Nutritional Assessment / Counseling / Intervention []  - 0 Lower Extremity Assessment (monofilament, tuning fork, pulses) []  - 0 Peripheral Arterial Disease Assessment (using hand held doppler) ASSESSMENTS - Ostomy and/or Continence Assessment and Care []  - Incontinence Assessment and Management 0 []  - 0 Ostomy Care Assessment and Management (repouching, etc.) PROCESS - Coordination of Care X - Simple Patient / Family Education for ongoing care 1 15 []  - 0 Complex (extensive) Patient / Family Education for ongoing care X- 1 10 Staff obtains Programmer, systems, Records, Test Results / Process Orders []  - 0 Staff telephones HHA, Nursing Homes / Clarify orders / etc []  - 0 Routine Transfer to another Facility (non-emergent condition) []  - 0 Routine Hospital Admission (non-emergent condition) []  - 0 New Admissions / Biomedical engineer / Ordering NPWT, Apligraf, etc. []  - 0 Emergency Hospital Admission (emergent condition) X- 1 10 Simple Discharge Coordination Koehne, [name] ([id number]) []  - 0 Complex (extensive) Discharge Coordination PROCESS - Special Needs []  - Pediatric / Minor Patient Management 0 []  - 0 Isolation Patient Management []  - 0 Hearing / Language / Visual special needs []  - 0 Assessment of Community assistance (transportation, D/C planning, etc.) []  - 0 Additional  assistance / Altered mentation []  - 0 Support Surface(s) Assessment (bed, cushion, seat, etc.) INTERVENTIONS - Wound Cleansing / Measurement []  - Simple Wound Cleansing - one wound 0 X- 2 5  Complex Wound Cleansing - multiple wounds X- 1 5 Wound Imaging (photographs - any number of wounds) []  - 0 Wound Tracing (instead of photographs) []  - 0 Simple Wound Measurement - one wound X- 2 5 Complex Wound Measurement - multiple wounds INTERVENTIONS - Wound Dressings []  - Small Wound Dressing one or multiple wounds 0 X- 1 15 Medium Wound Dressing one or multiple wounds []  - 0 Large Wound Dressing one or multiple wounds []  - 0 Application of Medications - topical []  - 0 Application of Medications - injection INTERVENTIONS - Miscellaneous []  - External ear exam 0 []  - 0 Specimen Collection (cultures, biopsies, blood, body fluids, etc.) []  - 0 Specimen(s) / Culture(s) sent or taken to Lab for analysis []  - 0 Patient Transfer (multiple staff / Civil Service fast streamer / Similar devices) []  - 0 Simple Staple / Suture removal (25 or less) []  - 0 Complex Staple / Suture removal (26 or more) []  - 0 Hypo / Hyperglycemic Management (close monitor of Blood Glucose) []  - 0 Ankle / Brachial Index (ABI) - do not check if billed separately X- 1 5 Vital Signs Smolen, [name] ([id number]) Has the patient been seen at the hospital within the last three years: Yes Total Score: 105 Level Of Care: New/Established - Level 3 Electronic Signature(s) Signed: 07/12/2019 4:09:28 PM By: Army Melia Entered By: Army Melia on 07/12/2019 14:34:17 Frede, Sarah Colon ([id number]) -------------------------------------------------------------------------------- Encounter Discharge Information Details Patient Name: Sarah Sarah Colon D. Date of Service: 07/12/2019 1:30 PM Medical Record Number: [id number] Patient Account Number: [id number] Date of Birth/Sex: [birth year] (74 y.o. F) Treating RN: Army Melia Primary Care  Carmine Youngberg: Jules Husbands Other Clinician: Referring Ajeenah Heiny: Jules Husbands Treating Erum Cercone/Extender: Melburn Hake, HOYT Weeks in Treatment: 1 Encounter Discharge Information Items Discharge Condition: Stable Ambulatory Status: Wheelchair Discharge Destination: Skilled Nursing Facility Telephoned: No Orders Sent: Yes Transportation: Private Auto Accompanied By: self Schedule Follow-up Appointment: Yes Clinical Summary of Care: Electronic Signature(s) Signed: 07/12/2019 4:09:28 PM By: Army Melia Entered By: Army Melia on 07/12/2019 14:35:49 Chretien, Jaretzi DMarland Colon ([id number]) -------------------------------------------------------------------------------- Lower Extremity Assessment Details Patient Name: Sarah Sarah Colon D. Date of Service: 07/12/2019 1:30 PM Medical Record Number: [id number] Patient Account Number: [id number] Date of Birth/Sex: 08/14/1945 (74 y.o. F) Treating RN: Army Melia Primary Care Galia Rahm: Jules Husbands Other Clinician: Referring Smita Lesh: Jules Husbands Treating Laszlo Ellerby/Extender: Melburn Hake, HOYT Weeks in Treatment: 1 Electronic Signature(s) Signed: 07/12/2019 4:09:28 PM By: Army Melia Entered By: Army Melia on 07/12/2019 13:48:11 Monsanto, Sarah Colon ([id number]) -------------------------------------------------------------------------------- Multi Wound Chart Details Patient Name: Sarah Sarah Colon D. Date of Service: 07/12/2019 1:30 PM Medical Record Number: [id number] Patient Account Number: [id number] Date of Birth/Sex: [birth year] (74 y.o. F) Treating RN: Army Melia Primary Care Shyra Emile: Jules Husbands Other Clinician: Referring Sriman Tally: Jules Husbands Treating Cabella Kimm/Extender: Melburn Hake, HOYT Weeks in Treatment: 1 Vital Signs Height(in): 59 Pulse(bpm): 107 Weight(lbs): 112 Blood Pressure(mmHg): 124/86 Body Mass Index(BMI): 23 Temperature(F): 99.5 Respiratory Rate 16 (breaths/min): Photos: [N/A:N/A] Wound Location: Left Sacrum  Back - Midline N/A Wounding Event: Pressure Injury Gradually Appeared N/A Primary Etiology: Pressure Ulcer Pressure Ulcer N/A Comorbid History: Cataracts, Glaucoma, Cataracts, Glaucoma, N/A Arrhythmia, Congestive Heart Arrhythmia, Congestive Heart Failure, Hypertension, Type II Failure, Hypertension, Type II Diabetes, End Stage Renal Diabetes, End Stage Renal Disease, History of pressure Disease, History of pressure wounds wounds Date Acquired: 07/01/2018 07/06/2019 N/A Weeks of Treatment: 1 0 N/A Wound Status: Open Open N/A Measurements L x W x D 1.5x1.6x1.3 0.5x1.2x0.1 N/A (cm) Area (cm) : 1.885 0.471 N/A Volume (cm) :  2.45 0.047 N/A % Reduction in Area: 0.00% 0.00% N/A % Reduction in Volume: 7.20% 0.00% N/A Classification: Category/Stage III Category/Stage I N/A Exudate Amount: Medium Small N/A Exudate Type: Serous Serous N/A Exudate Color: amber amber N/A Wound Margin: Epibole Flat and Intact N/A Granulation Amount: Medium (34-66%) Large (67-100%) N/A Granulation Quality: Red N/A N/A Necrotic Amount: Medium (34-66%) None Present (0%) N/A Exposed Structures: Fat Layer (Subcutaneous Fascia: No N/A Tissue) Exposed: Yes Fat Layer (Subcutaneous Fascia: No Tissue) Exposed: No Laborde, [name] ([id number]) Tendon: No Tendon: No Muscle: No Muscle: No Joint: No Joint: No Bone: No Bone: No Limited to Skin Breakdown Epithelialization: Large (67-100%) Small (1-33%) N/A Treatment Notes Electronic Signature(s) Signed: 07/12/2019 4:09:28 PM By: Army Melia Entered By: Army Melia on 07/12/2019 14:32:44 Hankins, Sarah Colon ([id number]) -------------------------------------------------------------------------------- Multi-Disciplinary Care Plan Details Patient Name: Sarah Sarah Colon D. Date of Service: 07/12/2019 1:30 PM Medical Record Number: [id number] Patient Account Number: [id number] Date of Birth/Sex: [birth year] (74 y.o. F) Treating RN: Army Melia Primary Care Pieter Fooks:  Jules Husbands Other Clinician: Referring Zaul Hubers: Jules Husbands Treating Sharda Keddy/Extender: Melburn Hake, HOYT Weeks in Treatment: 1 Active Inactive Abuse / Safety / Falls / Self Care Management Nursing Diagnoses: Potential for injury related to transfers Goals: Patient/caregiver will verbalize/demonstrate measures taken to improve the patient's personal safety Date Initiated: 07/05/2019 Target Resolution Date: 08/02/2019 Goal Status: Active Interventions: Provide education on fall prevention Notes: Orientation to the Wound Care Program Nursing Diagnoses: Knowledge deficit related to the wound healing center program Goals: Patient/caregiver will verbalize understanding of the [place] Program Date Initiated: 07/05/2019 Target Resolution Date: 08/02/2019 Goal Status: Active Interventions: Provide education on orientation to the wound center Notes: Pressure Nursing Diagnoses: Knowledge deficit related to management of pressures ulcers Goals: Patient/caregiver will verbalize risk factors for pressure ulcer development Date Initiated: 07/05/2019 Target Resolution Date: 08/02/2019 Goal Status: Active Interventions: Assess offloading mechanisms upon admission and as needed Conery, [name] ([id number]) Notes: Wound/Skin Impairment Nursing Diagnoses: Impaired tissue integrity Goals: Ulcer/skin breakdown will have a volume reduction of 30% by week 4 Date Initiated: 07/05/2019 Target Resolution Date: 08/02/2019 Goal Status: Active Interventions: Assess ulceration(s) every visit Notes: Electronic Signature(s) Signed: 07/12/2019 4:09:28 PM By: Army Melia Entered By: Army Melia on 07/12/2019 14:32:28 Karney, [name] ([id number]) -------------------------------------------------------------------------------- Pain Assessment Details Patient Name: Sarah Sarah Colon D. Date of Service: 07/12/2019 1:30 PM Medical Record Number: [id number] Patient Account Number:  [id number] Date of Birth/Sex: 02-16-1946 (74 y.o. F) Treating RN: Army Melia Primary Care Morgane Joerger: Jules Husbands Other Clinician: Referring Melysa Schroyer: Jules Husbands Treating Gopal Malter/Extender: Melburn Hake, HOYT Weeks in Treatment: 1 Active Problems Location of Pain Severity and Description of Pain Patient Has Paino No Site Locations Pain Management and Medication Current Pain Management: Electronic Signature(s) Signed: 07/12/2019 4:09:28 PM By: Army Melia Signed: 07/12/2019 4:36:47 PM By: Lorine Bears RCP, RRT, CHT Entered By: Lorine Bears on 07/12/2019 13:28:45 Pelto, Sarah Colon ([id number]) -------------------------------------------------------------------------------- Patient/Caregiver Education Details Patient Name: Sarah Sarah Colon D. Date of Service: 07/12/2019 1:30 PM Medical Record Number: [id number] Patient Account Number: [id number] Date of Birth/Gender: [birth year] (74 y.o. F) Treating RN: Army Melia Primary Care Physician: Jules Husbands Other Clinician: Referring Physician: Jules Husbands Treating Physician/Extender: Sharalyn Ink in Treatment: 1 Education Assessment Education Provided To: Patient Education Topics Provided Wound/Skin Impairment: Handouts: Caring for Your Ulcer Methods: Demonstration, Explain/Verbal Responses: State content correctly Electronic Signature(s) Signed: 07/12/2019 4:09:28 PM By: Army Melia Entered By: Army Melia on 07/12/2019 14:35:07 Acocella, [name] ([id number]) -------------------------------------------------------------------------------- Wound Assessment Details  Patient Name: Sarah Colon, [place]. Date of Service: 07/12/2019 1:30 PM Medical Record Number: [id number] Patient Account Number: [id number] Date of Birth/Sex: 05-05-1946 (74 y.o. F) Treating RN: Army Melia Primary Care Vandell Kun: Jules Husbands Other Clinician: Referring Dejohn Ibarra: Jules Husbands Treating  Daronte Shostak/Extender: Melburn Hake, HOYT Weeks in Treatment: 1 Wound Status Wound Number: 2 Primary Pressure Ulcer Etiology: Wound Location: Left Sacrum Wound Open Wounding Event: Pressure Injury Status: Date Acquired: 07/01/2018 Comorbid Cataracts, Glaucoma, Arrhythmia, Congestive Weeks Of Treatment: 1 History: Heart Failure, Hypertension, Type II Diabetes, Clustered Wound: No End Stage Renal Disease, History of pressure wounds Photos Wound Measurements Length: (cm) 1.5 % Reduction in Width: (cm) 1.6 % Reduction in Depth: (cm) 1.3 Epithelializati Area: (cm) 1.885 Volume: (cm) 2.45 Area: 0% Volume: 7.2% on: Large (67-100%) Wound Description Classification: Category/Stage III Foul Odor After Wound Margin: Epibole Slough/Fibrino Exudate Amount: Medium Exudate Type: Serous Exudate Color: amber Cleansing: No Yes Wound Bed Granulation Amount: Medium (34-66%) Exposed Structure Granulation Quality: Red Fascia Exposed: No Necrotic Amount: Medium (34-66%) Fat Layer (Subcutaneous Tissue) Exposed: Yes Necrotic Quality: Adherent Slough Tendon Exposed: No Muscle Exposed: No Joint Exposed: No Bone Exposed: No Fallin, Kilea D. ([id number]) Treatment Notes Wound #2 (Left Sacrum) Notes H. Blue and BFD on acrum, BFD on back Electronic Signature(s) Signed: 07/12/2019 4:09:28 PM By: Army Melia Entered By: Army Melia on 07/12/2019 13:48:00 Coba, [name] ([id number]) -------------------------------------------------------------------------------- Wound Assessment Details Patient Name: Sarah Sarah Colon D. Date of Service: 07/12/2019 1:30 PM Medical Record Number: [id number] Patient Account Number: [id number] Date of Birth/Sex: [birth year] (74 y.o. F) Treating RN: Army Melia Primary Care Mialee Weyman: Jules Husbands Other Clinician: Referring Marleny Faller: Jules Husbands Treating Broox Lonigro/Extender: Melburn Hake, HOYT Weeks in Treatment: 1 Wound Status Wound Number: 3 Primary Pressure  Ulcer Etiology: Wound Location: Back - Midline Wound Open Wounding Event: Gradually Appeared Status: Date Acquired: 07/06/2019 Comorbid Cataracts, Glaucoma, Arrhythmia, Congestive Weeks Of Treatment: 0 History: Heart Failure, Hypertension, Type II Diabetes, Clustered Wound: No End Stage Renal Disease, History of pressure wounds Photos Wound Measurements Length: (cm) 0.5 % Reduction in Width: (cm) 1.2 % Reduction in Depth: (cm) 0.1 Epithelializati Area: (cm) 0.471 Tunneling: Volume: (cm) 0.047 Undermining: Area: 0% Volume: 0% on: Small (1-33%) No No Wound Description Classification: Category/Stage I Wound Margin: Flat and Intact Exudate Amount: Small Exudate Type: Serous Exudate Color: amber Wound Bed Granulation Amount: Large (67-100%) Exposed Structure Necrotic Amount: None Present (0%) Fascia Exposed: No Fat Layer (Subcutaneous Tissue) Exposed: No Tendon Exposed: No Muscle Exposed: No Joint Exposed: No Bone Exposed: No Limited to Skin Breakdown Sattar, [name] ([id number]) Treatment Notes Wound #3 (Midline Back) Notes H. Blue and BFD on acrum, BFD on back Electronic Signature(s) Signed: 07/12/2019 4:09:28 PM By: Army Melia Entered By: Army Melia on 07/12/2019 13:47:39 Sarah Colon, Sarah Colon ([id number]) -------------------------------------------------------------------------------- Vitals Details Patient Name: Sarah Sarah Colon D. Date of Service: 07/12/2019 1:30 PM Medical Record Number: [id number] Patient Account Number: [id number] Date of Birth/Sex: May 19, 1946 (74 y.o. F) Treating RN: Army Melia Primary Care Shamel Galyean: Jules Husbands Other Clinician: Referring Jiali Linney: Jules Husbands Treating Gracen Ringwald/Extender: Melburn Hake, HOYT Weeks in Treatment: 1 Vital Signs Time Taken: 13:25 Temperature (F): 99.5 Height (in): 59 Pulse (bpm): 107 Weight (lbs): 112 Respiratory Rate (breaths/min): 16 Body Mass Index (BMI): 22.6 Blood Pressure (mmHg):  124/86 Reference Range: 80 - 120 mg / dl Electronic Signature(s) Signed: 07/12/2019 4:09:28 PM By: Army Melia Entered By: Army Melia on 07/12/2019 13:42:45

## 2019-07-12 NOTE — Progress Notes (Addendum)
Sarah, [name] ([id number]) Visit Report for 07/12/2019 Chief Complaint Document Details Patient Name: Sarah Colon, Sarah Colon. Date of Service: 07/12/2019 1:30 PM Medical Record Number: [id number] Patient Account Number: [id number] Date of Birth/Sex: 03-Mar-1946 (74 y.o. F) Treating RN: Army Melia Primary Care Provider: Jules Husbands Other Clinician: Referring Provider: Jules Husbands Treating Provider/Extender: Melburn Hake, Marshea Wisher Weeks in Treatment: 1 Information Obtained from: Patient Chief Complaint Sacral Pressure ulcer on the left Electronic Signature(s) Signed: 07/12/2019 2:20:18 PM By: Worthy Keeler PA-C Entered By: Worthy Keeler on 07/12/2019 14:20:17 Nutter, Lekeisha DMarland Kitchen ([id number]) -------------------------------------------------------------------------------- HPI Details Patient Name: Sarah Reach D. Date of Service: 07/12/2019 1:30 PM Medical Record Number: [id number] Patient Account Number: [id number] Date of Birth/Sex: 01-08-1946 (74 y.o. 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 She states that this initially occurred about a year ago closed and then reopened several weeks ago though she does not have a specific timeframe for this. Unfortunately she states there is some discomfort at this time. She does have a history of atrial fibrillation, congestive heart failure, hypertension, diabetes mellitus type II, and her most recent hemoglobin A1c was 6.8 on 05/30/18. She did have a CT scan of the sacral region on 06/19/18 this was negative for osteomyelitis which is excellent news. Patient was on doxycycline two weeks ago and it appears based on record review that she also is  currently on Cipro. There is some question about whether not she has a urinary tract infection. She did have a CBC performed which showed a white blood cell count at 20 which again indicates some kind of infection in my pinion. Again upon inspection today I seriously doubt that this is an infection secondary to the wound. I seen no signs of infection here. No fevers, chills, nausea, or vomiting noted at this time. The patient in fact states she feels really well. This again I feel like leads credence to the fact that she may have a urinary tract infection. 3/3/20Patient's wound bed currently shows evidence of great granulation at this time and does not appear to be signs of infection currently which is excellent news. It's actually fairly encouraging the wound is not doing any worse at this point. I am pleased with he address it has made. Readmission: 07/05/2019 on evaluation today patient presents for readmission she previously has been seen here in the clinic she is a referral from [facility] and rehabilitation center in [place]. Fortunately there is no signs of the patient having any severe infection based on what we are seeing today which is great news she has not had osteomyelitis even in the past when we have seen her. With that being said she does tell me that she unfortunately spends the majority of her time in bed sitting up which I think is putting pressure on the region which is why this does get smaller than reopens and she is really not able to get this to heal and close appropriately. Fortunately she is not having any significant pain. She tells me she does not really go outside of her room due to Covid there are pretty much isolated in the room she has been very bored over the past year. 07/12/2019 on evaluation today patient appears to be doing much  better in regard to her sacral wound at this point. She does have another small ulcer on her back in the lower portion of her  back that is present today as well although that seems to be very superficial. In general she has tolerated the air mattress although it is somewhat cold to her they have not put a blanket between the air mattress in her body I think that would make a big difference. I will write an order for that today. Electronic Signature(s) Signed: 07/12/2019 2:38:21 PM By: Worthy Keeler PA-C Entered By: Worthy Keeler on 07/12/2019 14:38:20 Mccombs, [name] ([id number]) -------------------------------------------------------------------------------- Physical Exam Details Patient Name: Sarah Reach D. Date of Service: 07/12/2019 1:30 PM Medical Record Number: [id number] Patient Account Number: [id number] Date of Birth/Sex: [birth year] (74 y.o. F) Treating RN: Army Melia Primary Care Provider: Jules Husbands Other Clinician: Referring Provider: Jules Husbands Treating Provider/Extender: Melburn Hake, Roena Sassaman Weeks in Treatment: 1 Constitutional Well-nourished and well-hydrated in no acute distress. Respiratory normal breathing without difficulty. Psychiatric this patient is able to make decisions and demonstrates good insight into disease process. Alert and Oriented x 3. pleasant and cooperative. Notes Patient's wound bed currently showed signs of good granulation at this time. Fortunately there is no signs of active infection and overall she seems to be doing excellent in regard to the sacral wound the wound on her lower back is very superficial I think just a bordered foam dressing here is likely to be sufficient. Electronic Signature(s) Signed: 07/12/2019 2:38:38 PM By: Worthy Keeler PA-C Entered By: Worthy Keeler on 07/12/2019 14:38:37 Kachmar, [name] ([id number]) -------------------------------------------------------------------------------- Physician Orders Details Patient Name: Sarah Reach D. Date of Service: 07/12/2019 1:30 PM Medical Record Number: [id number] Patient Account  Number: [id number] Date of Birth/Sex: 12/01/1945 (74 y.o. F) Treating RN: Army Melia Primary Care Provider: Jules Husbands Other Clinician: Referring Provider: Jules Husbands Treating Provider/Extender: Melburn Hake, Rmani Kellogg Weeks in Treatment: 1 Verbal / Phone Orders: No Diagnosis Coding ICD-10 Coding Code Description L89.153 Pressure ulcer of sacral region, stage 3 I10 Essential (primary) hypertension E11.622 Type 2 diabetes mellitus with other skin ulcer I48.20 Chronic atrial fibrillation, unspecified I50.42 Chronic combined systolic (congestive) and diastolic (congestive) heart failure Wound Cleansing Wound #2 Left Sacrum o Clean wound with Normal Saline. - in clinic o May Shower, gently pat wound dry prior to applying new dressing. Wound #3 Midline Back o Clean wound with Normal Saline. - in clinic o May Shower, gently pat wound dry prior to applying new dressing. Skin Barriers/Peri-Wound Care Wound #2 Left Sacrum o Skin Prep Wound #3 Midline Back o Skin Prep Primary Wound Dressing Wound #2 Left Sacrum o Hydrafera Blue Ready Transfer - packed into wound 12-12 8-12o'clock is the deeper undermining that needs to be filled with a single piece hydrafera blue Secondary Dressing Wound #2 Left Sacrum o Boardered Foam Dressing Wound #3 Midline Back o Boardered Foam Dressing Dressing Change Frequency Wound #2 Left Sacrum o Change dressing every day. o Other: - as needed for incontinence Wound #3 Midline Back Warrick, Sheyna D. ([id number]) o Change dressing every day. o Other: - as needed for incontinence Follow-up Appointments Wound #2 Left Sacrum o Return Appointment in 2 weeks. Wound #3 Midline Back o Return Appointment in 2 weeks. Off-Loading Wound #2 Left Sacrum o Mattress - Pt needs air mattress o Turn and reposition every 2 hours Wound #3 Midline Back o Mattress - Pt needs air mattress o Turn and reposition every 2  hours Electronic Signature(s)  Signed: 07/12/2019 4:09:28 PM By: Army Melia Signed: 07/12/2019 6:17:56 PM By: Worthy Keeler PA-C Entered By: Army Melia on 07/12/2019 14:34:45 Leatherwood, Janeli DMarland Kitchen ([id number]) -------------------------------------------------------------------------------- Problem List Details Patient Name: Sarah Reach D. Date of Service: 07/12/2019 1:30 PM Medical Record Number: [id number] Patient Account Number: [id number] Date of Birth/Sex: Aug 28, 1945 (74 y.o. F) Treating RN: Army Melia Primary Care Provider: Jules Husbands Other Clinician: Referring Provider: Jules Husbands Treating Provider/Extender: Melburn Hake, Lenus Trauger Weeks in Treatment: 1 Active Problems ICD-10 Evaluated Encounter Code Description Active Date Today Diagnosis L89.153 Pressure ulcer of sacral region, stage 3 07/05/2019 No Yes I10 Essential (primary) hypertension 07/05/2019 No Yes E11.622 Type 2 diabetes mellitus with other skin ulcer 07/05/2019 No Yes I48.20 Chronic atrial fibrillation, unspecified 07/05/2019 No Yes I50.42 Chronic combined systolic (congestive) and diastolic 0/05/6008 No Yes (congestive) heart failure Inactive Problems Resolved Problems Electronic Signature(s) Signed: 07/12/2019 2:20:10 PM By: Worthy Keeler PA-C Entered By: Worthy Keeler on 07/12/2019 14:20:09 Sunga, Megan D. ([id number]) -------------------------------------------------------------------------------- Progress Note Details Patient Name: Sarah Reach D. Date of Service: 07/12/2019 1:30 PM Medical Record Number: [id number] Patient Account Number: [id number] Date of Birth/Sex: [birth year] (74 y.o. 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 She states that this initially occurred about a year ago closed and then reopened several weeks ago though she does not have a specific timeframe for this. Unfortunately she states there is some discomfort at this time. She does have a history of atrial fibrillation, congestive heart failure, hypertension, diabetes mellitus type II, and her most recent hemoglobin A1c was 6.8 on 05/30/18. She did have a CT scan of the sacral region on 06/19/18 this was negative for osteomyelitis which is excellent news. Patient was on doxycycline two weeks ago and it appears based on record review that she also is currently on Cipro. There is some question about whether not she has a urinary tract infection. She did have a CBC performed which showed a white blood cell count at 20 which again indicates some kind of infection in my pinion. Again upon inspection today I seriously doubt that this is an infection secondary to the wound. I seen no signs of infection here. No fevers, chills, nausea, or vomiting noted at this time. The patient in fact states she feels really well. This again I feel like leads credence to the fact that she may have a urinary tract infection. 3/3/20Patient's wound bed currently shows evidence of great granulation at this time and does not appear to be signs of infection currently which is excellent news. It's actually fairly encouraging the wound is not doing any worse at this point. I am pleased with he address it has made. Readmission: 07/05/2019 on evaluation today patient presents for readmission she previously has been seen here in the clinic she is a referral from [facility] and rehabilitation center in [place]. Fortunately there is no signs of the patient having any severe infection based on what we are seeing today which is great news she has not had  osteomyelitis even in the past when we have seen her. With that being said she does tell me that she unfortunately spends the majority of her  time in bed sitting up which I think is putting pressure on the region which is why this does get smaller than reopens and she is really not able to get this to heal and close appropriately. Fortunately she is not having any significant pain. She tells me she does not really go outside of her room due to Covid there are pretty much isolated in the room she has been very bored over the past year. 07/12/2019 on evaluation today patient appears to be doing much better in regard to her sacral wound at this point. She does have another small ulcer on her back in the lower portion of her back that is present today as well although that seems to be very superficial. In general she has tolerated the air mattress although it is somewhat cold to her they have not put a blanket between the air mattress in her body I think that would make a big difference. I will write an order for that today. Objective Scorza, Nitasha D. ([id number]) Constitutional Well-nourished and well-hydrated in no acute distress. Vitals Time Taken: 1:25 PM, Height: 59 in, Weight: 112 lbs, BMI: 22.6, Temperature: 99.5 F, Pulse: 107 bpm, Respiratory Rate: 16 breaths/min, Blood Pressure: 124/86 mmHg. Respiratory normal breathing without difficulty. Psychiatric this patient is able to make decisions and demonstrates good insight into disease process. Alert and Oriented x 3. pleasant and cooperative. General Notes: Patient's wound bed currently showed signs of good granulation at this time. Fortunately there is no signs of active infection and overall she seems to be doing excellent in regard to the sacral wound the wound on her lower back is very superficial I think just a bordered foam dressing here is likely to be sufficient. Integumentary (Hair, Skin) Wound #2 status is Open. Original cause  of wound was Pressure Injury. The wound is located on the Left Sacrum. The wound measures 1.5cm length x 1.6cm width x 1.3cm depth; 1.885cm^2 area and 2.45cm^3 volume. There is Fat Layer (Subcutaneous Tissue) Exposed exposed. There is a medium amount of serous drainage noted. The wound margin is epibole. There is medium (34-66%) red granulation within the wound bed. There is a medium (34-66%) amount of necrotic tissue within the wound bed including Adherent Slough. Wound #3 status is Open. Original cause of wound was Gradually Appeared. The wound is located on the Midline Back. The wound measures 0.5cm length x 1.2cm width x 0.1cm depth; 0.471cm^2 area and 0.047cm^3 volume. The wound is limited to skin breakdown. There is no tunneling or undermining noted. There is a small amount of serous drainage noted. The wound margin is flat and intact. There is large (67-100%) granulation within the wound bed. There is no necrotic tissue within the wound bed. Assessment Active Problems ICD-10 Pressure ulcer of sacral region, stage 3 Essential (primary) hypertension Type 2 diabetes mellitus with other skin ulcer Chronic atrial fibrillation, unspecified Chronic combined systolic (congestive) and diastolic (congestive) heart failure Plan Wound Cleansing: Wound #2 Left Sacrum: Clean wound with Normal Saline. - in clinic May Shower, gently pat wound dry prior to applying new dressing. JAMYRA, ZWEIG ([id number]) Wound #3 Midline Back: Clean wound with Normal Saline. - in clinic May Shower, gently pat wound dry prior to applying new dressing. Skin Barriers/Peri-Wound Care: Wound #2 Left Sacrum: Skin Prep Wound #3 Midline Back: Skin Prep Primary Wound Dressing: Wound #2 Left Sacrum: Hydrafera Blue Ready Transfer - packed into wound 12-12 8-12o'clock is the deeper undermining that needs to be filled with a single  piece hydrafera blue Secondary Dressing: Wound #2 Left Sacrum: Boardered Foam  Dressing Wound #3 Midline Back: Boardered Foam Dressing Dressing Change Frequency: Wound #2 Left Sacrum: Change dressing every day. Other: - as needed for incontinence Wound #3 Midline Back: Change dressing every day. Other: - as needed for incontinence Follow-up Appointments: Wound #2 Left Sacrum: Return Appointment in 2 weeks. Wound #3 Midline Back: Return Appointment in 2 weeks. Off-Loading: Wound #2 Left Sacrum: Mattress - Pt needs air mattress Turn and reposition every 2 hours Wound #3 Midline Back: Mattress - Pt needs air mattress Turn and reposition every 2 hours 1. I would recommend that we go ahead and initiate treatment with a continuation of the Hydrofera Blue dressing for the sacral wound I think that is doing a great job and overall she is tolerating things very well. 2. I would also recommend she continue with the air mattress I think that is beneficial for her as far as offloading is concerned. 3. We will use a border foam dressing just for protection of the lower back ulcer I do not think this needs to be dressed with any specific dressings otherwise. We will see patient back for reevaluation in 1 week here in the clinic. If anything worsens or changes patient will contact our office for additional recommendations. Electronic Signature(s) Signed: 07/12/2019 2:39:17 PM By: Worthy Keeler PA-C Entered By: Worthy Keeler on 07/12/2019 14:39:16 Searfoss, Kathlean DMarland Kitchen ([id number]) -------------------------------------------------------------------------------- SuperBill Details Patient Name: Sarah Reach D. Date of Service: 07/12/2019 Medical Record Number: [id number] Patient Account Number: [id number] Date of Birth/Sex: 06/27/1945 (74 y.o. F) Treating RN: Army Melia Primary Care Provider: Jules Husbands Other Clinician: Referring Provider: Jules Husbands Treating Provider/Extender: Melburn Hake, Geovani Tootle Weeks in Treatment: 1 Diagnosis Coding ICD-10 Codes Code  Description L89.153 Pressure ulcer of sacral region, stage 3 I10 Essential (primary) hypertension E11.622 Type 2 diabetes mellitus with other skin ulcer I48.20 Chronic atrial fibrillation, unspecified I50.42 Chronic combined systolic (congestive) and diastolic (congestive) heart failure Facility Procedures CPT4 Code: [id number] Description: 99213 - WOUND CARE VISIT-LEV 3 EST PT Modifier: Quantity: 1 Physician Procedures CPT4 Code: [id number] Description: 99213 - WC PHYS LEVEL 3 - EST PT ICD-10 Diagnosis Description L89.153 Pressure ulcer of sacral region, stage 3 I10 Essential (primary) hypertension E11.622 Type 2 diabetes mellitus with other skin ulcer I48.20 Chronic atrial fibrillation,  unspecified Modifier: Quantity: 1 Electronic Signature(s) Signed: 07/12/2019 2:39:36 PM By: Worthy Keeler PA-C Entered By: Worthy Keeler on 07/12/2019 14:39:35

## 2019-07-26 ENCOUNTER — Other Ambulatory Visit: Payer: Self-pay

## 2019-07-26 ENCOUNTER — Encounter: Payer: Medicare Other | Admitting: Physician Assistant

## 2019-07-26 DIAGNOSIS — L89153 Pressure ulcer of sacral region, stage 3: Secondary | ICD-10-CM | POA: Diagnosis not present

## 2019-07-26 NOTE — Progress Notes (Addendum)
JANEICE, STEGALL ([id number]) Visit Report for 07/26/2019 Chief Complaint Document Details Patient Name: SARYN, CHERRY. Date of Service: 07/26/2019 12:30 PM Medical Record Number: [id number] Patient Account Number: [id number] Date of Birth/Sex: 1946-03-26 (74 y.o. F) Treating RN: Army Melia Primary Care Provider: Jules Husbands Other Clinician: Referring Provider: Jules Husbands Treating Provider/Extender: Melburn Hake, Wenonah Milo Weeks in Treatment: 3 Information Obtained from: Patient Chief Complaint Sacral Pressure ulcer on the left Electronic Signature(s) Signed: 07/26/2019 1:04:40 PM By: Worthy Keeler PA-C Entered By: Worthy Keeler on 07/26/2019 13:04:39 Mazzarella, Fruma D. ([id number]) -------------------------------------------------------------------------------- HPI Details Patient Name: [name] Date of Service: 07/26/2019 12:30 PM Medical Record Number: [id number] Patient Account Number: [id number] Date of Birth/Sex: 1945-06-10 (74 y.o. 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 She states that this initially occurred about a year ago closed and then reopened several weeks ago though she does not have a specific timeframe for this. Unfortunately she states there is some discomfort at this time. She does have a history of atrial fibrillation, congestive heart failure, hypertension, diabetes mellitus type II, and her most recent hemoglobin A1c was 6.8 on 05/30/18. She did have a CT scan of the sacral region on 06/19/18 this was negative for osteomyelitis which is excellent news. Patient was on doxycycline two weeks ago and it appears based on record review that she also  is currently on Cipro. There is some question about whether not she has a urinary tract infection. She did have a CBC performed which showed a white blood cell count at 20 which again indicates some kind of infection in my pinion. Again upon inspection today I seriously doubt that this is an infection secondary to the wound. I seen no signs of infection here. No fevers, chills, nausea, or vomiting noted at this time. The patient in fact states she feels really well. This again I feel like leads credence to the fact that she may have a urinary tract infection. 3/3/20Patient's wound bed currently shows evidence of great granulation at this time and does not appear to be signs of infection currently which is excellent news. It's actually fairly encouraging the wound is not doing any worse at this point. I am pleased with he address it has made. Readmission: 07/05/2019 on evaluation today patient presents for readmission she previously has been seen here in the clinic she is a referral from [facility] and rehabilitation center in [place]. Fortunately there is no signs of the patient having any severe infection based on what we are seeing today which is great news she has not had osteomyelitis even in the past when we have seen her. With that being said she does tell me that she unfortunately spends the majority of her time in bed sitting up which I think is putting pressure on the region which is why this does get smaller than reopens and she is really not able to get this to heal and close appropriately. Fortunately she is not having any significant pain. She tells me she does not really go outside of her room due to Covid there are pretty much isolated in the room she has been very bored over the past year. 07/12/2019 on evaluation today patient appears to be doing much  better in regard to her sacral wound at this point. She does have another small ulcer on her back in the lower portion of her  back that is present today as well although that seems to be very superficial. In general she has tolerated the air mattress although it is somewhat cold to her they have not put a blanket between the air mattress in her body I think that would make a big difference. I will write an order for that today. 07/26/2019 upon evaluation today patient appears to be doing well with regard to her wound. I do feel like we are seeing signs of improvement here it is measuring smaller and overall I am pleased. The wound on her back is doing much better as the sacral wound that is still open. Fortunately there is no signs of active infection at this time. Electronic Signature(s) Signed: 07/26/2019 1:13:26 PM By: Worthy Keeler PA-C Entered By: Worthy Keeler on 07/26/2019 13:13:26 Stagner, [name] ([id number]) -------------------------------------------------------------------------------- Physical Exam Details Patient Name: [name] Date of Service: 07/26/2019 12:30 PM Medical Record Number: [id number] Patient Account Number: [id number] Date of Birth/Sex: [birth year] (74 y.o. F) Treating RN: Army Melia Primary Care Provider: Jules Husbands Other Clinician: Referring Provider: Jules Husbands Treating Provider/Extender: Melburn Hake, Divine Imber Weeks in Treatment: 3 Constitutional Well-nourished and well-hydrated in no acute distress. Respiratory normal breathing without difficulty. Psychiatric this patient is able to make decisions and demonstrates good insight into disease process. Alert and Oriented x 3. pleasant and cooperative. Notes The wound bed currently showed signs of good granulation there is no evidence of active infection at this time. There is still some undermining but again this is measuring smaller and not as deep as it was previous overall very pleased with the progress that she is made. Back ulcer is completely healed its the sacral ulcer that still open. Electronic  Signature(s) Signed: 07/26/2019 1:14:29 PM By: Worthy Keeler PA-C Entered By: Worthy Keeler on 07/26/2019 13:14:29 Mroz, [name] ([id number]) -------------------------------------------------------------------------------- Physician Orders Details Patient Name: [name] Date of Service: 07/26/2019 12:30 PM Medical Record Number: [id number] Patient Account Number: [id number] Date of Birth/Sex: 11-Sep-1945 (74 y.o. F) Treating RN: Army Melia Primary Care Provider: Jules Husbands Other Clinician: Referring Provider: Jules Husbands Treating Provider/Extender: Melburn Hake, Hakop Humbarger Weeks in Treatment: 3 Verbal / Phone Orders: No Diagnosis Coding ICD-10 Coding Code Description L89.153 Pressure ulcer of sacral region, stage 3 I10 Essential (primary) hypertension E11.622 Type 2 diabetes mellitus with other skin ulcer I48.20 Chronic atrial fibrillation, unspecified I50.42 Chronic combined systolic (congestive) and diastolic (congestive) heart failure Wound Cleansing Wound #2 Left Sacrum o Clean wound with Normal Saline. - in clinic o May Shower, gently pat wound dry prior to applying new dressing. Skin Barriers/Peri-Wound Care Wound #2 Left Sacrum o Skin Prep Primary Wound Dressing Wound #2 Left Sacrum o Hydrafera Blue Ready Transfer - packed into wound 12-12 8-12o'clock is the deeper undermining that needs to be filled with a single piece hydrafera blue Secondary Dressing Wound #2 Left Sacrum o Boardered Foam Dressing Dressing Change Frequency Wound #2 Left Sacrum o Change dressing every day. o Other: - as needed for incontinence Follow-up Appointments Wound #2 Left Sacrum o Return Appointment in 2 weeks. Off-Loading Wound #2 Left Sacrum o Mattress - Pt needs air mattress o Turn and reposition every 2 hours Electronic Signature(s) Signed: 07/26/2019 5:55:32 PM By: Worthy Keeler PA-C Signed: 07/27/2019 9:24:26 AM By: Army Melia Entered By:  Army Melia on  07/26/2019 13:08:52 Copenhaver, KORAIMA ALBERTSEN ([id number]) -------------------------------------------------------------------------------- Problem List Details Patient Name: ZEBA, LUBY. Date of Service: 07/26/2019 12:30 PM Medical Record Number: [id number] Patient Account Number: [id number] Date of Birth/Sex: 12/24/1945 (74 y.o. F) Treating RN: Army Melia Primary Care Provider: Jules Husbands Other Clinician: Referring Provider: Jules Husbands Treating Provider/Extender: Melburn Hake, Mykalah Saari Weeks in Treatment: 3 Active Problems ICD-10 Evaluated Encounter Code Description Active Date Today Diagnosis L89.153 Pressure ulcer of sacral region, stage 3 07/05/2019 No Yes I10 Essential (primary) hypertension 07/05/2019 No Yes E11.622 Type 2 diabetes mellitus with other skin ulcer 07/05/2019 No Yes I48.20 Chronic atrial fibrillation, unspecified 07/05/2019 No Yes I50.42 Chronic combined systolic (congestive) and diastolic (congestive) heart 07/05/2019 No Yes failure Inactive Problems Resolved Problems Electronic Signature(s) Signed: 07/26/2019 1:04:33 PM By: Worthy Keeler PA-C Entered By: Worthy Keeler on 07/26/2019 13:04:32 Arutyunyan, Treazure D. ([id number]) -------------------------------------------------------------------------------- Progress Note Details Patient Name: [name] Date of Service: 07/26/2019 12:30 PM Medical Record Number: [id number] Patient Account Number: [id number] Date of Birth/Sex: 1946-03-14 (74 y.o. 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 She states that this  initially occurred about a year ago closed and then reopened several weeks ago though she does not have a specific timeframe for this. Unfortunately she states there is some discomfort at this time. She does have a history of atrial fibrillation, congestive heart failure, hypertension, diabetes mellitus type II, and her most recent hemoglobin A1c was 6.8 on 05/30/18. She did have a CT scan of the sacral region on 06/19/18 this was negative for osteomyelitis which is excellent news. Patient was on doxycycline two weeks ago and it appears based on record review that she also is currently on Cipro. There is some question about whether not she has a urinary tract infection. She did have a CBC performed which showed a white blood cell count at 20 which again indicates some kind of infection in my pinion. Again upon inspection today I seriously doubt that this is an infection secondary to the wound. I seen no signs of infection here. No fevers, chills, nausea, or vomiting noted at this time. The patient in fact states she feels really well. This again I feel like leads credence to the fact that she may have a urinary tract infection. 3/3/20Patient's wound bed currently shows evidence of great granulation at this time and does not appear to be signs of infection currently which is excellent news. It's actually fairly encouraging the wound is not doing any worse at this point. I am pleased with he address it has made. Readmission: 07/05/2019 on evaluation today patient presents for readmission she previously has been seen here in the clinic she is a referral from [facility] and rehabilitation center in [place]. Fortunately there is no signs of the patient having any severe infection based on what we are seeing today which is great news she has not had osteomyelitis even in the past when we have seen her. With that being said she does tell me that she unfortunately spends the majority of her time in  bed sitting up which I think is putting pressure on the region which is why this does get smaller  than reopens and she is really not able to get this to heal and close appropriately. Fortunately she is not having any significant pain. She tells me she does not really go outside of her room due to Covid there are pretty much isolated in the room she has been very bored over the past year. 07/12/2019 on evaluation today patient appears to be doing much better in regard to her sacral wound at this point. She does have another small ulcer on her back in the lower portion of her back that is present today as well although that seems to be very superficial. In general she has tolerated the air mattress although it is somewhat cold to her they have not put a blanket between the air mattress in her body I think that would make a big difference. I will write an order for that today. 07/26/2019 upon evaluation today patient appears to be doing well with regard to her wound. I do feel like we are seeing signs of improvement here it is measuring smaller and overall I am pleased. The wound on her back is doing much better as the sacral wound that is still open. Fortunately there is no signs of active infection at this time. Objective Constitutional Well-nourished and well-hydrated in no acute distress. Vitals Time Taken: 12:40 PM, Height: 59 in, Weight: 112 lbs, BMI: 22.6, Temperature: 98.4 F, Pulse: 77 bpm, Respiratory Rate: 16 breaths/min, Blood Pressure: 126/69 mmHg. Respiratory normal breathing without difficulty. Psychiatric Valeriano, ABBEYGAIL IGOE ([id number]) this patient is able to make decisions and demonstrates good insight into disease process. Alert and Oriented x 3. pleasant and cooperative. General Notes: The wound bed currently showed signs of good granulation there is no evidence of active infection at this time. There is still some undermining but again this is measuring smaller and not as deep as  it was previous overall very pleased with the progress that she is made. Back ulcer is completely healed its the sacral ulcer that still open. Integumentary (Hair, Skin) Wound #2 status is Open. Original cause of wound was Pressure Injury. The wound is located on the Left Sacrum. The wound measures 1.5cm length x 1cm width x 1cm depth; 1.178cm^2 area and 1.178cm^3 volume. There is Fat Layer (Subcutaneous Tissue) Exposed exposed. There is no tunneling noted, however, there is undermining starting at 6:00 and ending at 2:00 with a maximum distance of 1cm. There is a medium amount of serous drainage noted. The wound margin is epibole. There is medium (34-66%) red granulation within the wound bed. There is a medium (34-66%) amount of necrotic tissue within the wound bed including Adherent Slough. Wound #3 status is Healed - Epithelialized. Original cause of wound was Gradually Appeared. The wound is located on the Midline Back. The wound measures 0cm length x 0cm width x 0cm depth; 0cm^2 area and 0cm^3 volume. The wound is limited to skin breakdown. There is no tunneling or undermining noted. There is a none present amount of drainage noted. The wound margin is flat and intact. There is no granulation within the wound bed. There is no necrotic tissue within the wound bed. Assessment Active Problems ICD-10 Pressure ulcer of sacral region, stage 3 Essential (primary) hypertension Type 2 diabetes mellitus with other skin ulcer Chronic atrial fibrillation, unspecified Chronic combined systolic (congestive) and diastolic (congestive) heart failure Plan Wound Cleansing: Wound #2 Left Sacrum: Clean wound with Normal Saline. - in clinic May Shower, gently pat wound dry prior to applying new dressing. Skin Barriers/Peri-Wound  Care: Wound #2 Left Sacrum: Skin Prep Primary Wound Dressing: Wound #2 Left Sacrum: Hydrafera Blue Ready Transfer - packed into wound 12-12 8-12o'clock is the deeper  undermining that needs to be filled with a single piece hydrafera blue Secondary Dressing: Wound #2 Left Sacrum: Boardered Foam Dressing Dressing Change Frequency: Wound #2 Left Sacrum: Change dressing every day. Other: - as needed for incontinence Follow-up Appointments: Wound #2 Left Sacrum: Return Appointment in 2 weeks. Off-Loading: Wound #2 Left Sacrum: Mattress - Pt needs air mattress Turn and reposition every 2 hours Mills, Terryann D. ([id number]) 1. I would recommend currently that we continue with the [facility] at this point. 2. I am also going to suggest that we continue with the bordered foam dressing to cover as that also seems to have been of benefit. 3. I am getting suggest that we have the patient continue with appropriate offloading including an air mattress which has done well for her. 4. She also tells me that she is supposed to be leaving the facility on Monday. That is the first time of heard of this and in fact the patient states that she is only known of it yesterday and this morning. Overall I am not exactly sure how this will impact her care. I do believe that we will need to keep a close eye on her even if she is not coming from the facility I still think she needs to be seen here in the clinic and I discussed that with her today. She states her daughter can bring her. We will see patient back for reevaluation in 1 week here in the clinic. If anything worsens or changes patient will contact our office for additional recommendations. Electronic Signature(s) Signed: 07/26/2019 1:15:36 PM By: Worthy Keeler PA-C Entered By: Worthy Keeler on 07/26/2019 13:15:36 Kunin, Nature DMarland Kitchen ([id number]) -------------------------------------------------------------------------------- SuperBill Details Patient Name: [name] Date of Service: 07/26/2019 Medical Record Number: [id number] Patient Account Number: [id number] Date of Birth/Sex: 1946/03/02 (74 y.o.  F) Treating RN: Army Melia Primary Care Provider: Jules Husbands Other Clinician: Referring Provider: Jules Husbands Treating Provider/Extender: Melburn Hake, Lissete Maestas Weeks in Treatment: 3 Diagnosis Coding ICD-10 Codes Code Description L89.153 Pressure ulcer of sacral region, stage 3 I10 Essential (primary) hypertension E11.622 Type 2 diabetes mellitus with other skin ulcer I48.20 Chronic atrial fibrillation, unspecified I50.42 Chronic combined systolic (congestive) and diastolic (congestive) heart failure Facility Procedures CPT4 Code: [id number] Description: 99213 - WOUND CARE VISIT-LEV 3 EST PT Modifier: Quantity: 1 Physician Procedures CPT4 Code: [id number] Description: 99213 - WC PHYS LEVEL 3 - EST PT Modifier: Quantity: 1 CPT4 Code: Description: ICD-10 Diagnosis Description L89.153 Pressure ulcer of sacral region, stage 3 I10 Essential (primary) hypertension E11.622 Type 2 diabetes mellitus with other skin ulcer I48.20 Chronic atrial fibrillation, unspecified Modifier: Quantity: Electronic Signature(s) Signed: 07/26/2019 1:15:57 PM By: Worthy Keeler PA-C Entered By: Worthy Keeler on 07/26/2019 13:15:56

## 2019-07-27 NOTE — Progress Notes (Signed)
Sarah Colon ([id number]) Visit Report for 07/26/2019 Arrival Information Details Patient Name: Sarah Colon, Sarah Colon. Date of Service: 07/26/2019 12:30 PM Medical Record Number: [id number] Patient Account Number: [id number] Date of Birth/Sex: June 14, 1945 (73 y.o. F) Treating RN: Sarah Colon Primary Care Sarah Colon: Sarah Colon Other Clinician: Referring Render Marley: Sarah Colon Treating Sarah Colon/Extender: Sarah Colon, Sarah Colon Weeks in Treatment: 3 Visit Information History Since Last Visit Added or deleted any medications: No Patient Arrived: Wheel Chair Any new allergies or adverse reactions: No Arrival Time: 12:42 Had a fall or experienced change in No Accompanied By: self activities of daily living that may affect Transfer Assistance: Harrel Lemon Lift risk of falls: Patient Identification Verified: Yes Signs or symptoms of abuse/neglect since last visito No Secondary Verification Process Completed: Yes Hospitalized since last visit: No Implantable device outside of the clinic excluding No cellular tissue based products placed in the center since last visit: Has Dressing in Place as Prescribed: Yes Pain Present Now: No Electronic Signature(s) Signed: 07/26/2019 4:39:13 PM By: Sarah Colon RCP, RRT, CHT Entered By: Sarah Colon on 07/26/2019 12:43:03 [name], Sarah Sarah Kitchen ([id number]) -------------------------------------------------------------------------------- Clinic Level of Care Assessment Details Patient Name: Sarah Reach Colon. Date of Service: 07/26/2019 12:30 PM Medical Record Number: [id number] Patient Account Number: [id number] Date of Birth/Sex: July 20, 1945 (74 y.o. F) Treating RN: Sarah Colon Primary Care Norvell Caswell: Sarah Colon Other Clinician: Referring Laterica Matarazzo: Sarah Colon Treating Sarah Colon/Extender: Sarah Colon, Sarah Colon Weeks in Treatment: 3 Clinic Level of Care Assessment Items TOOL 4 Quantity Score []  - Use when only an EandM is  performed on FOLLOW-UP visit 0 ASSESSMENTS - Nursing Assessment / Reassessment X - Reassessment of Co-morbidities (includes updates in patient status) 1 10 X- 1 5 Reassessment of Adherence to Treatment Plan ASSESSMENTS - Wound and Skin Assessment / Reassessment X - Simple Wound Assessment / Reassessment - one wound 1 5 []  - 0 Complex Wound Assessment / Reassessment - multiple wounds []  - 0 Dermatologic / Skin Assessment (not related to wound area) ASSESSMENTS - Focused Assessment []  - Circumferential Edema Measurements - multi extremities 0 []  - 0 Nutritional Assessment / Counseling / Intervention []  - 0 Lower Extremity Assessment (monofilament, tuning fork, pulses) []  - 0 Peripheral Arterial Disease Assessment (using hand held doppler) ASSESSMENTS - Ostomy and/or Continence Assessment and Care X - Incontinence Assessment and Management 1 10 []  - 0 Ostomy Care Assessment and Management (repouching, etc.) PROCESS - Coordination of Care X - Simple Patient / Family Education for ongoing care 1 15 []  - 0 Complex (extensive) Patient / Family Education for ongoing care []  - 0 Staff obtains Programmer, systems, Records, Test Results / Process Orders []  - 0 Staff telephones HHA, Nursing Homes / Clarify orders / etc []  - 0 Routine Transfer to another Facility (non-emergent condition) []  - 0 Routine Hospital Admission (non-emergent condition) []  - 0 New Admissions / Biomedical engineer / Ordering NPWT, Apligraf, etc. []  - 0 Emergency Hospital Admission (emergent condition) X- 1 10 Simple Discharge Coordination []  - 0 Complex (extensive) Discharge Coordination PROCESS - Special Needs []  - Pediatric / Minor Patient Management 0 []  - 0 Isolation Patient Management []  - 0 Hearing / Language / Visual special needs []  - 0 Assessment of Community assistance (transportation, Colon/C planning, etc.) [name], Sarah Colon. ([id number]) []  - 0 Additional assistance / Altered mentation []  -  0 Support Surface(s) Assessment (bed, cushion, seat, etc.) INTERVENTIONS - Wound Cleansing / Measurement X - Simple Wound Cleansing - one wound 1 5 []  - 0 Complex Wound Cleansing -  multiple wounds X- 1 5 Wound Imaging (photographs - any number of wounds) []  - 0 Wound Tracing (instead of photographs) X- 1 5 Simple Wound Measurement - one wound []  - 0 Complex Wound Measurement - multiple wounds INTERVENTIONS - Wound Dressings []  - Small Wound Dressing one or multiple wounds 0 X- 1 15 Medium Wound Dressing one or multiple wounds []  - 0 Large Wound Dressing one or multiple wounds []  - 0 Application of Medications - topical []  - 0 Application of Medications - injection INTERVENTIONS - Miscellaneous []  - External ear exam 0 []  - 0 Specimen Collection (cultures, biopsies, blood, body fluids, etc.) []  - 0 Specimen(s) / Culture(s) sent or taken to Lab for analysis []  - 0 Patient Transfer (multiple staff / Civil Service fast streamer / Similar devices) []  - 0 Simple Staple / Suture removal (25 or less) []  - 0 Complex Staple / Suture removal (26 or more) []  - 0 Hypo / Hyperglycemic Management (close monitor of Blood Glucose) []  - 0 Ankle / Brachial Index (ABI) - do not check if billed separately X- 1 5 Vital Signs Has the patient been seen at the hospital within the last three years: Yes Total Score: 90 Level Of Care: New/Established - Level 3 Electronic Signature(s) Signed: 07/27/2019 9:24:26 AM By: Sarah Colon Entered By: Sarah Colon on 07/26/2019 13:09:53 [name], Man Sarah Kitchen ([id number]) -------------------------------------------------------------------------------- Encounter Discharge Information Details Patient Name: Sarah Reach Colon. Date of Service: 07/26/2019 12:30 PM Medical Record Number: [id number] Patient Account Number: [id number] Date of Birth/Sex: [birth year] (74 y.o. F) Treating RN: Sarah Colon Primary Care Tillmon Kisling: Sarah Colon Other Clinician: Referring Sarah Colon:  Sarah Colon Treating Dreyson Mishkin/Extender: Sarah Colon, Sarah Colon Weeks in Treatment: 3 Encounter Discharge Information Items Discharge Condition: Stable Ambulatory Status: Wheelchair Discharge Destination: Home Transportation: Private Auto Accompanied By: self Schedule Follow-up Appointment: Yes Clinical Summary of Care: Electronic Signature(s) Signed: 07/27/2019 9:24:26 AM By: Sarah Colon Entered By: Sarah Colon on 07/26/2019 13:10:37 Reisen, [name] ([id number]) -------------------------------------------------------------------------------- Lower Extremity Assessment Details Patient Name: Sarah Reach Colon. Date of Service: 07/26/2019 12:30 PM Medical Record Number: [id number] Patient Account Number: [id number] Date of Birth/Sex: March 04, 1946 (74 y.o. F) Treating RN: Montey Hora Primary Care Dwight Burdo: Sarah Colon Other Clinician: Referring Jaydi Bray: Sarah Colon Treating Candence Sease/Extender: Sarah Colon, Sarah Colon Weeks in Treatment: 3 Electronic Signature(s) Signed: 07/26/2019 5:02:05 PM By: Montey Hora Entered By: Montey Hora on 07/26/2019 12:58:44 Pam, Taniah Colon. ([id number]) -------------------------------------------------------------------------------- Multi Wound Chart Details Patient Name: Sarah Reach Colon. Date of Service: 07/26/2019 12:30 PM Medical Record Number: [id number] Patient Account Number: [id number] Date of Birth/Sex: 08/25/1945 (74 y.o. F) Treating RN: Sarah Colon Primary Care Letricia Krinsky: Sarah Colon Other Clinician: Referring Mayson Mcneish: Sarah Colon Treating Jearline Hirschhorn/Extender: Sarah Colon, Sarah Colon Weeks in Treatment: 3 Vital Signs Height(in): 59 Pulse(bpm): 2 Weight(lbs): 112 Blood Pressure(mmHg): 126/69 Body Mass Index(BMI): 23 Temperature(F): 98.4 Respiratory Rate(breaths/min): 16 Photos: [N/A:N/A] Wound Location: Left Sacrum Back - Midline N/A Wounding Event: Pressure Injury Gradually Appeared N/A Primary Etiology: Pressure Ulcer  Pressure Ulcer N/A Comorbid History: Cataracts, Glaucoma, Arrhythmia, Cataracts, Glaucoma, Arrhythmia, N/A Congestive Heart Failure, Congestive Heart Failure, Hypertension, Type II Diabetes, End Hypertension, Type II Diabetes, End Stage Renal Disease, History of Stage Renal Disease, History of pressure wounds pressure wounds Date Acquired: 07/01/2018 07/06/2019 N/A Weeks of Treatment: 3 2 N/A Wound Status: Open Open N/A Measurements L x W x Colon (cm) 1.5x1x1 0.1x0.1x0.1 N/A Area (cm) : 1.178 0.008 N/A Volume (cm) : 1.178 0.001 N/A % Reduction in Area: 37.50% 98.30% N/A % Reduction in Volume: 55.40%  97.90% N/A Starting Position 1 (o'clock): 6 Ending Position 1 (o'clock): 2 Maximum Distance 1 (cm): 1 Undermining: Yes No N/A Classification: Category/Stage III Category/Stage I N/A Exudate Amount: Medium None Present N/A Exudate Type: Serous N/A N/A Exudate Color: amber N/A N/A Wound Margin: Epibole Flat and Intact N/A Granulation Amount: Medium (34-66%) None Present (0%) N/A Granulation Quality: Red N/A N/A Necrotic Amount: Medium (34-66%) None Present (0%) N/A Exposed Structures: Fat Layer (Subcutaneous Tissue) Fascia: No N/A Exposed: Yes Fat Layer (Subcutaneous Tissue) Fascia: No Exposed: No Tendon: No Tendon: No Muscle: No Muscle: No Joint: No Joint: No Bone: No Bone: No Limited to Skin Breakdown Epithelialization: Large (67-100%) Large (67-100%) N/A Novacek, Pebbles Sarah Kitchen ([id number]) Treatment Notes Electronic Signature(s) Signed: 07/27/2019 9:24:26 AM By: Sarah Colon Entered By: Sarah Colon on 07/26/2019 13:07:11 Ilagan, [name] ([id number]) -------------------------------------------------------------------------------- Multi-Disciplinary Care Plan Details Patient Name: Sarah Reach Colon. Date of Service: 07/26/2019 12:30 PM Medical Record Number: [id number] Patient Account Number: [id number] Date of Birth/Sex: 07/22/1945 (74 y.o. F) Treating RN: Sarah Colon Primary  Care Cyntia Staley: Sarah Colon Other Clinician: Referring Alayziah Tangeman: Sarah Colon Treating Irys Nigh/Extender: Sarah Colon, Sarah Colon Weeks in Treatment: 3 Active Inactive Abuse / Safety / Falls / Self Care Management Nursing Diagnoses: Potential for injury related to transfers Goals: Patient/caregiver will verbalize/demonstrate measures taken to improve the patient's personal safety Date Initiated: 07/05/2019 Target Resolution Date: 08/02/2019 Goal Status: Active Interventions: Provide education on fall prevention Notes: Orientation to the Wound Care Program Nursing Diagnoses: Knowledge deficit related to the wound healing center program Goals: Patient/caregiver will verbalize understanding of the [facility] Date Initiated: 07/05/2019 Target Resolution Date: 08/02/2019 Goal Status: Active Interventions: Provide education on orientation to the wound center Notes: Pressure Nursing Diagnoses: Knowledge deficit related to management of pressures ulcers Goals: Patient/caregiver will verbalize risk factors for pressure ulcer development Date Initiated: 07/05/2019 Target Resolution Date: 08/02/2019 Goal Status: Active Interventions: Assess offloading mechanisms upon admission and as needed Notes: Wound/Skin Impairment Nursing Diagnoses: Impaired tissue integrity Jaworski, Evoleth Colon. ([id number]) Goals: Ulcer/skin breakdown will have a volume reduction of 30% by week 4 Date Initiated: 07/05/2019 Target Resolution Date: 08/02/2019 Goal Status: Active Interventions: Assess ulceration(s) every visit Notes: Electronic Signature(s) Signed: 07/27/2019 9:24:26 AM By: Sarah Colon Entered By: Sarah Colon on 07/26/2019 13:07:54 Hao, Loretto Colon. ([id number]) -------------------------------------------------------------------------------- Pain Assessment Details Patient Name: Sarah Reach Colon. Date of Service: 07/26/2019 12:30 PM Medical Record Number: [id number] Patient Account  Number: [id number] Date of Birth/Sex: 06-06-1945 (74 y.o. F) Treating RN: Montey Hora Primary Care Daran Favaro: Sarah Colon Other Clinician: Referring Kawika Bischoff: Sarah Colon Treating Lakedra Washington/Extender: Sarah Colon, Sarah Colon Weeks in Treatment: 3 Active Problems Location of Pain Severity and Description of Pain Patient Has Paino No Site Locations Pain Management and Medication Current Pain Management: Electronic Signature(s) Signed: 07/26/2019 5:02:05 PM By: Montey Hora Entered By: Montey Hora on 07/26/2019 12:59:01 Noguchi, [name] ([id number]) -------------------------------------------------------------------------------- Patient/Caregiver Education Details Patient Name: Sarah Reach Colon. Date of Service: 07/26/2019 12:30 PM Medical Record Number: [id number] Patient Account Number: [id number] Date of Birth/Gender: May 26, 1946 (74 y.o. F) Treating RN: Sarah Colon Primary Care Physician: Sarah Colon Other Clinician: Referring Physician: Jules Colon Treating Physician/Extender: Sharalyn Ink in Treatment: 3 Education Assessment Education Provided To: Patient Education Topics Provided Wound/Skin Impairment: Handouts: Caring for Your Ulcer Methods: Demonstration, Explain/Verbal Responses: State content correctly Electronic Signature(s) Signed: 07/27/2019 9:24:26 AM By: Sarah Colon Entered By: Sarah Colon on 07/26/2019 13:10:06 Fifer, Makhiya Colon. ([id number]) -------------------------------------------------------------------------------- Wound Assessment Details Patient Name: Sarah Reach Colon. Date  of Service: 07/26/2019 12:30 PM Medical Record Number: [id number] Patient Account Number: [id number] Date of Birth/Sex: 01-01-1946 (75 y.o. F) Treating RN: Sarah Colon Primary Care Meryl Ponder: Sarah Colon Other Clinician: Referring Demetrion Wesby: Sarah Colon Treating Prairie Stenberg/Extender: Sarah Colon, Sarah Colon Weeks in Treatment: 3 Wound Status Wound Number: 2  Primary Pressure Ulcer Etiology: Wound Location: Left Sacrum Wound Open Wounding Event: Pressure Injury Status: Date Acquired: 07/01/2018 Comorbid Cataracts, Glaucoma, Arrhythmia, Congestive Heart Failure, Weeks Of Treatment: 3 History: Hypertension, Type II Diabetes, End Stage Renal Disease, Clustered Wound: No History of pressure wounds Photos Wound Measurements Length: (cm) 1.5 % Redu Width: (cm) 1 % Redu Depth: (cm) 1 Epithe Area: (cm) 1.178 Tunne Volume: (cm) 1.178 Under Sta End Max ction in Area: 37.5% ction in Volume: 55.4% lialization: Large (67-100%) ling: No mining: Yes rting Position (o'clock): 6 ing Position (o'clock): 2 imum Distance: (cm) 1 Wound Description Classification: Category/Stage III Foul O Wound Margin: Epibole Slough Exudate Amount: Medium Exudate Type: Serous Exudate Color: amber dor After Cleansing: No /Fibrino Yes Wound Bed Granulation Amount: Medium (34-66%) Exposed Structure Granulation Quality: Red Fascia Exposed: No Necrotic Amount: Medium (34-66%) Fat Layer (Subcutaneous Tissue) Exposed: Yes Necrotic Quality: Adherent Slough Tendon Exposed: No Muscle Exposed: No Joint Exposed: No Bone Exposed: No Treatment Notes Wound #2 (Left Sacrum) Gramajo, Brita Colon. ([id number]) Notes H. Blue and BFD Electronic Signature(s) Signed: 07/27/2019 9:24:26 AM By: Sarah Colon Previous Signature: 07/26/2019 1:06:22 PM Version By: Montey Hora Entered By: Sarah Colon on 07/26/2019 13:07:35 Staton, Destinae Sarah Kitchen ([id number]) -------------------------------------------------------------------------------- Wound Assessment Details Patient Name: Sarah Reach Colon. Date of Service: 07/26/2019 12:30 PM Medical Record Number: [id number] Patient Account Number: [id number] Date of Birth/Sex: [birth year] (74 y.o. F) Treating RN: Sarah Colon Primary Care Cecil Bixby: Sarah Colon Other Clinician: Referring Patrice Matthew: Sarah Colon Treating  Mariesa Grieder/Extender: Sarah Colon, Sarah Colon Weeks in Treatment: 3 Wound Status Wound Number: 3 Primary Pressure Ulcer Etiology: Wound Location: Midline Back Wound Healed - Epithelialized Wounding Event: Gradually Appeared Status: Date Acquired: 07/06/2019 Comorbid Cataracts, Glaucoma, Arrhythmia, Congestive Heart Failure, Weeks Of Treatment: 2 History: Hypertension, Type II Diabetes, End Stage Renal Disease, Clustered Wound: No History of pressure wounds Photos Wound Measurements Length: (cm) Width: (cm) Depth: (cm) Area: (cm) Volume: (cm) 0 % Reduction in Area: 100% 0 % Reduction in Volume: 100% 0 Epithelialization: Large (67-100%) 0 Tunneling: No 0 Undermining: No Wound Description Classification: Category/Stage I Wound Margin: Flat and Intact Exudate Amount: None Present Foul Odor After Cleansing: No Slough/Fibrino No Wound Bed Granulation Amount: None Present (0%) Exposed Structure Necrotic Amount: None Present (0%) Fascia Exposed: No Fat Layer (Subcutaneous Tissue) Exposed: No Tendon Exposed: No Muscle Exposed: No Joint Exposed: No Bone Exposed: No Limited to Skin Breakdown Electronic Signature(s) Signed: 07/27/2019 9:24:26 AM By: Sarah Colon Previous Signature: 07/26/2019 1:06:58 PM Version By: Montey Hora Entered By: Sarah Colon on 07/26/2019 13:07:36 Quayle, Amadi Colon. ([id number]) -------------------------------------------------------------------------------- Vitals Details Patient Name: Sarah Reach Colon. Date of Service: 07/26/2019 12:30 PM Medical Record Number: [id number] Patient Account Number: [id number] Date of Birth/Sex: January 19, 1946 (74 y.o. F) Treating RN: Sarah Colon Primary Care Oswin Johal: Sarah Colon Other Clinician: Referring Jeilyn Reznik: Sarah Colon Treating Shamanda Len/Extender: Sarah Colon, Sarah Colon Weeks in Treatment: 3 Vital Signs Time Taken: 12:40 Temperature (F): 98.4 Height (in): 59 Pulse (bpm): 77 Weight (lbs): 112 Respiratory Rate  (breaths/min): 16 Body Mass Index (BMI): 22.6 Blood Pressure (mmHg): 126/69 Reference Range: 80 - 120 mg / dl Electronic Signature(s) Signed: 07/26/2019 4:39:13 PM By: Becky Sax, Sallie RCP, RRT, CHT Entered By: Becky Sax,  Sallie on 07/26/2019 12:43:28

## 2019-08-09 ENCOUNTER — Encounter: Payer: Medicare Other | Attending: Physician Assistant | Admitting: Physician Assistant

## 2019-08-09 ENCOUNTER — Other Ambulatory Visit: Payer: Self-pay

## 2019-08-09 DIAGNOSIS — L89153 Pressure ulcer of sacral region, stage 3: Secondary | ICD-10-CM | POA: Diagnosis not present

## 2019-08-09 DIAGNOSIS — I110 Hypertensive heart disease with heart failure: Secondary | ICD-10-CM | POA: Diagnosis not present

## 2019-08-09 DIAGNOSIS — E11622 Type 2 diabetes mellitus with other skin ulcer: Secondary | ICD-10-CM | POA: Insufficient documentation

## 2019-08-09 DIAGNOSIS — I5042 Chronic combined systolic (congestive) and diastolic (congestive) heart failure: Secondary | ICD-10-CM | POA: Insufficient documentation

## 2019-08-09 DIAGNOSIS — I482 Chronic atrial fibrillation, unspecified: Secondary | ICD-10-CM | POA: Diagnosis not present

## 2019-08-09 NOTE — Progress Notes (Signed)
Sarah Colon, Sarah Colon ([id number]) Visit Report for 08/09/2019 Arrival Information Details Patient Name: Sarah Colon. Date of Service: 08/09/2019 12:30 PM Medical Record Number: [id number] Patient Account Number: [id number] Date of Birth/Sex: 01/23/1946 (74 y.o. F) Treating RN: Army Melia Primary Care Marqueze Ramcharan: Jules Husbands Other Clinician: Referring Malekai Markwood: Jules Husbands Treating Samari Bittinger/Extender: Melburn Hake, HOYT Weeks in Treatment: 5 Visit Information History Since Last Visit Added or deleted any medications: No Patient Arrived: Wheel Chair Any new allergies or adverse reactions: No Arrival Time: 12:36 Had a fall or experienced change in No Accompanied By: self activities of daily living that may affect Transfer Assistance: Harrel Lemon Lift risk of falls: Patient Identification Verified: Yes Signs or symptoms of abuse/neglect since last visito No Secondary Verification Process Completed: Yes Hospitalized since last visit: No Implantable device outside of the clinic excluding No cellular tissue based products placed in the center since last visit: Has Dressing in Place as Prescribed: Yes Pain Present Now: No Electronic Signature(s) Signed: 08/09/2019 3:53:04 PM By: Lorine Bears RCP, RRT, CHT Entered By: Lorine Bears on 08/09/2019 12:42:09 Sarah Colon, Sarah Colon ([id number]) -------------------------------------------------------------------------------- Clinic Level of Care Assessment Details Patient Name: Sarah Reach D. Date of Service: 08/09/2019 12:30 PM Medical Record Number: [id number] Patient Account Number: [id number] Date of Birth/Sex: Feb 13, 1946 (74 y.o. F) Treating RN: Army Melia Primary Care Monay Houlton: Jules Husbands Other Clinician: Referring Zaeden Lastinger: Jules Husbands Treating Ohana Birdwell/Extender: Melburn Hake, HOYT Weeks in Treatment: 5 Clinic Level of Care Assessment Items TOOL 4 Quantity Score []  - Use when only an EandM is  performed on FOLLOW-UP visit 0 ASSESSMENTS - Nursing Assessment / Reassessment X - Reassessment of Co-morbidities (includes updates in patient status) 1 10 X- 1 5 Reassessment of Adherence to Treatment Plan ASSESSMENTS - Wound and Skin Assessment / Reassessment X - Simple Wound Assessment / Reassessment - one wound 1 5 []  - 0 Complex Wound Assessment / Reassessment - multiple wounds []  - 0 Dermatologic / Skin Assessment (not related to wound area) ASSESSMENTS - Focused Assessment []  - Circumferential Edema Measurements - multi extremities 0 []  - 0 Nutritional Assessment / Counseling / Intervention []  - 0 Lower Extremity Assessment (monofilament, tuning fork, pulses) []  - 0 Peripheral Arterial Disease Assessment (using hand held doppler) ASSESSMENTS - Ostomy and/or Continence Assessment and Care []  - Incontinence Assessment and Management 0 []  - 0 Ostomy Care Assessment and Management (repouching, etc.) PROCESS - Coordination of Care X - Simple Patient / Family Education for ongoing care 1 15 []  - 0 Complex (extensive) Patient / Family Education for ongoing care []  - 0 Staff obtains Programmer, systems, Records, Test Results / Process Orders []  - 0 Staff telephones HHA, Nursing Homes / Clarify orders / etc []  - 0 Routine Transfer to another Facility (non-emergent condition) []  - 0 Routine Hospital Admission (non-emergent condition) []  - 0 New Admissions / Biomedical engineer / Ordering NPWT, Apligraf, etc. []  - 0 Emergency Hospital Admission (emergent condition) X- 1 10 Simple Discharge Coordination []  - 0 Complex (extensive) Discharge Coordination PROCESS - Special Needs []  - Pediatric / Minor Patient Management 0 []  - 0 Isolation Patient Management []  - 0 Hearing / Language / Visual special needs []  - 0 Assessment of Community assistance (transportation, D/C planning, etc.) Sarah Colon, [name] ([id number]) []  - 0 Additional assistance / Altered mentation []  - 0 Support  Surface(s) Assessment (bed, cushion, seat, etc.) INTERVENTIONS - Wound Cleansing / Measurement X - Simple Wound Cleansing - one wound 1 5 []  - 0 Complex Wound Cleansing -  multiple wounds X- 1 5 Wound Imaging (photographs - any number of wounds) []  - 0 Wound Tracing (instead of photographs) X- 1 5 Simple Wound Measurement - one wound []  - 0 Complex Wound Measurement - multiple wounds INTERVENTIONS - Wound Dressings []  - Small Wound Dressing one or multiple wounds 0 X- 1 15 Medium Wound Dressing one or multiple wounds []  - 0 Large Wound Dressing one or multiple wounds []  - 0 Application of Medications - topical []  - 0 Application of Medications - injection INTERVENTIONS - Miscellaneous []  - External ear exam 0 []  - 0 Specimen Collection (cultures, biopsies, blood, body fluids, etc.) []  - 0 Specimen(s) / Culture(s) sent or taken to Lab for analysis []  - 0 Patient Transfer (multiple staff / Civil Service fast streamer / Similar devices) []  - 0 Simple Staple / Suture removal (25 or less) []  - 0 Complex Staple / Suture removal (26 or more) []  - 0 Hypo / Hyperglycemic Management (close monitor of Blood Glucose) []  - 0 Ankle / Brachial Index (ABI) - do not check if billed separately X- 1 5 Vital Signs Has the patient been seen at the hospital within the last three years: Yes Total Score: 80 Level Of Care: New/Established - Level 3 Electronic Signature(s) Signed: 08/09/2019 3:43:50 PM By: Army Melia Entered By: Army Melia on 08/09/2019 13:07:47 Sarah Colon, Sarah Colon Sarah Colon ([id number]) -------------------------------------------------------------------------------- Encounter Discharge Information Details Patient Name: Sarah Reach D. Date of Service: 08/09/2019 12:30 PM Medical Record Number: [id number] Patient Account Number: [id number] Date of Birth/Sex: 07-May-1946 (74 y.o. F) Treating RN: Army Melia Primary Care Emilea Goga: Jules Husbands Other Clinician: Referring Ameer Sanden: Jules Husbands Treating Alpha Chouinard/Extender: Melburn Hake, HOYT Weeks in Treatment: 5 Encounter Discharge Information Items Discharge Condition: Stable Ambulatory Status: Wheelchair Discharge Destination: Skilled Nursing Facility Telephoned: No Orders Sent: Yes Transportation: Private Auto Accompanied By: self Schedule Follow-up Appointment: Yes Clinical Summary of Care: Electronic Signature(s) Signed: 08/09/2019 3:43:50 PM By: Army Melia Entered By: Army Melia on 08/09/2019 13:08:25 Sarah Colon, Sarah Colon ([id number]) -------------------------------------------------------------------------------- Lower Extremity Assessment Details Patient Name: Sarah Reach D. Date of Service: 08/09/2019 12:30 PM Medical Record Number: [id number] Patient Account Number: [id number] Date of Birth/Sex: 1946/05/22 (74 y.o. F) Treating RN: Montey Hora Primary Care Keeleigh Terris: Jules Husbands Other Clinician: Referring Luisangel Wainright: Jules Husbands Treating Avari Gelles/Extender: Melburn Hake, HOYT Weeks in Treatment: 5 Electronic Signature(s) Signed: 08/09/2019 4:05:00 PM By: Montey Hora Entered By: Montey Hora on 08/09/2019 12:57:11 Sarah Colon, Sarah Colon Sarah Colon ([id number]) -------------------------------------------------------------------------------- Multi Wound Chart Details Patient Name: Sarah Reach D. Date of Service: 08/09/2019 12:30 PM Medical Record Number: [id number] Patient Account Number: [id number] Date of Birth/Sex: [birth year] (74 y.o. F) Treating RN: Army Melia Primary Care Cecil Vandyke: Jules Husbands Other Clinician: Referring Xyler Terpening: Jules Husbands Treating Ervine Witucki/Extender: Melburn Hake, HOYT Weeks in Treatment: 5 Vital Signs Height(in): 59 Pulse(bpm): 85 Weight(lbs): 112 Blood Pressure(mmHg): 148/74 Body Mass Index(BMI): 23 Temperature(F): 98.5 Respiratory Rate(breaths/min): 18 Photos: [N/A:N/A] Wound Location: Left Sacrum N/A N/A Wounding Event: Pressure Injury N/A N/A Primary Etiology:  Pressure Ulcer N/A N/A Comorbid History: Cataracts, Glaucoma, Arrhythmia, N/A N/A Congestive Heart Failure, Hypertension, Type II Diabetes, End Stage Renal Disease, History of pressure wounds Date Acquired: 07/01/2018 N/A N/A Weeks of Treatment: 5 N/A N/A Wound Status: Open N/A N/A Measurements L x W x D (cm) 1.1x1.2x0.7 N/A N/A Area (cm) : 1.037 N/A N/A Volume (cm) : 0.726 N/A N/A % Reduction in Area: 45.00% N/A N/A % Reduction in Volume: 72.50% N/A N/A Starting Position 1 (o'clock): 6 Ending Position 1 (o'clock): 2 Maximum Distance  1 (cm): 1.9 Undermining: Yes N/A N/A Classification: Category/Stage III N/A N/A Exudate Amount: Medium N/A N/A Exudate Type: Serous N/A N/A Exudate Color: amber N/A N/A Wound Margin: Epibole N/A N/A Granulation Amount: Large (67-100%) N/A N/A Granulation Quality: Red N/A N/A Necrotic Amount: Small (1-33%) N/A N/A Exposed Structures: Fat Layer (Subcutaneous Tissue) N/A N/A Exposed: Yes Fascia: No Tendon: No Muscle: No Joint: No Bone: No Epithelialization: Large (67-100%) N/A N/A Treatment Notes Sarah Colon, Sarah Colon ([id number]) Electronic Signature(s) Signed: 08/09/2019 3:43:50 PM By: Army Melia Entered By: Army Melia on 08/09/2019 13:05:45 Sarah Colon, Sarah Colon ([id number]) -------------------------------------------------------------------------------- Multi-Disciplinary Care Plan Details Patient Name: Sarah Reach D. Date of Service: 08/09/2019 12:30 PM Medical Record Number: [id number] Patient Account Number: [id number] Date of Birth/Sex: 02/29/1946 (74 y.o. F) Treating RN: Army Melia Primary Care Savahna Casados: Jules Husbands Other Clinician: Referring Tylon Kemmerling: Jules Husbands Treating Lynanne Delgreco/Extender: Melburn Hake, HOYT Weeks in Treatment: 5 Active Inactive Abuse / Safety / Falls / Self Care Management Nursing Diagnoses: Potential for injury related to transfers Goals: Patient/caregiver will verbalize/demonstrate measures taken to  improve the patient's personal safety Date Initiated: 07/05/2019 Target Resolution Date: 08/02/2019 Goal Status: Active Interventions: Provide education on fall prevention Notes: Pressure Nursing Diagnoses: Knowledge deficit related to management of pressures ulcers Goals: Patient/caregiver will verbalize risk factors for pressure ulcer development Date Initiated: 07/05/2019 Target Resolution Date: 08/02/2019 Goal Status: Active Interventions: Assess offloading mechanisms upon admission and as needed Notes: Wound/Skin Impairment Nursing Diagnoses: Impaired tissue integrity Goals: Ulcer/skin breakdown will have a volume reduction of 30% by week 4 Date Initiated: 07/05/2019 Target Resolution Date: 08/02/2019 Goal Status: Active Interventions: Assess ulceration(s) every visit Notes: Electronic Signature(s) Signed: 08/09/2019 3:43:50 PM By: Army Melia Entered By: Army Melia on 08/09/2019 13:05:38 Sarah Colon, [name] ([id number]) Sarah Colon, [name] ([id number]) -------------------------------------------------------------------------------- Pain Assessment Details Patient Name: Sarah Reach D. Date of Service: 08/09/2019 12:30 PM Medical Record Number: [id number] Patient Account Number: [id number] Date of Birth/Sex: 02-Nov-1945 (74 y.o. F) Treating RN: Montey Hora Primary Care Kathren Scearce: Jules Husbands Other Clinician: Referring Sanna Porcaro: Jules Husbands Treating Ladean Steinmeyer/Extender: Melburn Hake, HOYT Weeks in Treatment: 5 Active Problems Location of Pain Severity and Description of Pain Patient Has Paino No Site Locations Pain Management and Medication Current Pain Management: Electronic Signature(s) Signed: 08/09/2019 4:05:00 PM By: Montey Hora Entered By: Montey Hora on 08/09/2019 12:54:43 Sarah Colon, Sarah Colon ([id number]) -------------------------------------------------------------------------------- Patient/Caregiver Education Details Patient Name: Sarah Reach  D. Date of Service: 08/09/2019 12:30 PM Medical Record Number: [id number] Patient Account Number: [id number] Date of Birth/Gender: [birth year] (74 y.o. F) Treating RN: Army Melia Primary Care Physician: Jules Husbands Other Clinician: Referring Physician: Jules Husbands Treating Physician/Extender: Sharalyn Ink in Treatment: 5 Education Assessment Education Provided To: Patient Education Topics Provided Wound/Skin Impairment: Handouts: Caring for Your Ulcer Methods: Demonstration, Explain/Verbal Responses: State content correctly Electronic Signature(s) Signed: 08/09/2019 3:43:50 PM By: Army Melia Entered By: Army Melia on 08/09/2019 13:07:59 Sarah Colon, [name] ([id number]) -------------------------------------------------------------------------------- Wound Assessment Details Patient Name: Sarah Reach D. Date of Service: 08/09/2019 12:30 PM Medical Record Number: [id number] Patient Account Number: [id number] Date of Birth/Sex: 1946-04-12 (73 y.o. F) Treating RN: Cornell Barman Primary Care Lucrezia Dehne: Jules Husbands Other Clinician: Referring Cayson Kalb: Jules Husbands Treating Ewel Lona/Extender: Melburn Hake, HOYT Weeks in Treatment: 5 Wound Status Wound Number: 2 Primary Pressure Ulcer Etiology: Wound Location: Left Sacrum Wound Open Wounding Event: Pressure Injury Status: Date Acquired: 07/01/2018 Comorbid Cataracts, Glaucoma, Arrhythmia, Congestive Heart Failure, Weeks Of Treatment: 5 History: Hypertension, Type II Diabetes, End Stage Renal Disease, Clustered Wound: No  History of pressure wounds Photos Wound Measurements Length: (cm) 1.1 % Red Width: (cm) 1.2 % Red Depth: (cm) 0.7 Epith Area: (cm) 1.037 Tunn Volume: (cm) 0.726 Un[address] St En Ma uction in Area: 45% uction in Volume: 72.5% elialization: Large (67-100%) eling: No rmining: Yes arting Position (o'clock): 6 ding Position (o'clock): 2 ximum Distance: (cm) 1.9 Wound  Description Classification: Category/Stage III Foul Wound Margin: Epibole Sloug Exudate Amount: Medium Exudate Type: Serous Exudate Color: amber Odor After Cleansing: No h/Fibrino Yes Wound Bed Granulation Amount: Large (67-100%) Exposed Structure Granulation Quality: Red Fascia Exposed: No Necrotic Amount: Small (1-33%) Fat Layer (Subcutaneous Tissue) Exposed: Yes Necrotic Quality: Adherent Slough Tendon Exposed: No Muscle Exposed: No Joint Exposed: No Bone Exposed: No Treatment Notes Wound #2 (Left Sacrum) Sarah Colon, Tishia D. ([id number]) Notes H. Blue and BFD Electronic Signature(s) Signed: 08/09/2019 4:33:53 PM By: Gretta Cool, BSN, RN, CWS, Kim RN, BSN Entered By: Gretta Cool, BSN, RN, CWS, Kim on 08/09/2019 13:23:43 Pohl, Sarah Colon ([id number]) -------------------------------------------------------------------------------- Vitals Details Patient Name: Sarah Reach D. Date of Service: 08/09/2019 12:30 PM Medical Record Number: [id number] Patient Account Number: [id number] Date of Birth/Sex: July 13, 1945 (74 y.o. F) Treating RN: Army Melia Primary Care Boyd Litaker: Jules Husbands Other Clinician: Referring Hasina Kreager: Jules Husbands Treating Suleiman Finigan/Extender: Melburn Hake, HOYT Weeks in Treatment: 5 Vital Signs Time Taken: 12:40 Temperature (F): 98.5 Height (in): 59 Pulse (bpm): 85 Weight (lbs): 112 Respiratory Rate (breaths/min): 18 Body Mass Index (BMI): 22.6 Blood Pressure (mmHg): 148/74 Reference Range: 80 - 120 mg / dl Electronic Signature(s) Signed: 08/09/2019 3:53:04 PM By: Lorine Bears RCP, RRT, CHT Entered By: Lorine Bears on 08/09/2019 12:44:02

## 2019-08-09 NOTE — Progress Notes (Addendum)
[name], [name] ([id number]) Visit Report for 08/09/2019 Chief Complaint Document Details Patient Name: Sarah Colon, Sarah Colon. Date of Service: 08/09/2019 12:30 PM Medical Record Number: [id number] Patient Account Number: [id number] Date of Birth/Sex: 05-Jun-1945 (74 y.o. F) Treating RN: Army Melia Primary Care Provider: Jules Husbands Other Clinician: Referring Provider: Jules Husbands Treating Provider/Extender: Melburn Hake, Tylicia Sherman Weeks in Treatment: 5 Information Obtained from: Patient Chief Complaint Sacral Pressure ulcer on the left Electronic Signature(s) Signed: 08/09/2019 1:01:52 PM By: Worthy Keeler PA-C Entered By: Worthy Keeler on 08/09/2019 13:01:52 Bentson, Sarah Colon ([id number]) -------------------------------------------------------------------------------- HPI Details Patient Name: Sarah Colon. Date of Service: 08/09/2019 12:30 PM Medical Record Number: [id number] Patient Account Number: [id number] Date of Birth/Sex: 1945/07/15 (74 y.o. 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 She states that this initially occurred about a year ago closed and then reopened several weeks ago though she does not have a specific timeframe for this. Unfortunately she states there is some discomfort at this time. She does have a history of atrial fibrillation, congestive heart failure, hypertension, diabetes mellitus type II, and her most recent hemoglobin A1c was 6.8 on 05/30/18. She did have a CT scan of the sacral region on 06/19/18 this was negative for osteomyelitis which is excellent news. Patient was on doxycycline two weeks ago and it appears based on record review that she also  is currently on Cipro. There is some question about whether not she has a urinary tract infection. She did have a CBC performed which showed a white blood cell count at 20 which again indicates some kind of infection in my pinion. Again upon inspection today I seriously doubt that this is an infection secondary to the wound. I seen no signs of infection here. No fevers, chills, nausea, or vomiting noted at this time. The patient in fact states she feels really well. This again I feel like leads credence to the fact that she may have a urinary tract infection. 3/3/20Patient's wound bed currently shows evidence of great granulation at this time and does not appear to be signs of infection currently which is excellent news. It's actually fairly encouraging the wound is not doing any worse at this point. I am pleased with he address it has made. Readmission: 07/05/2019 on evaluation today patient presents for readmission she previously has been seen here in the clinic she is a referral from [facility] and rehabilitation center in [place]. Fortunately there is no signs of the patient having any severe infection based on what we are seeing today which is great news she has not had osteomyelitis even in the past when we have seen her. With that being said she does tell me that she unfortunately spends the majority of her time in bed sitting up which I think is putting pressure on the region which is why this does get smaller than reopens and she is really not able to get this to heal and close appropriately. Fortunately she is not having any significant pain. She tells me she does not really go outside of her room due to Covid there are pretty much isolated in the room she has been very bored over the past year. 07/12/2019 on evaluation today patient appears to be doing much  better in regard to her sacral wound at this point. She does have another small ulcer on her back in the lower portion of her  back that is present today as well although that seems to be very superficial. In general she has tolerated the air mattress although it is somewhat cold to her they have not put a blanket between the air mattress in her body I think that would make a big difference. I will write an order for that today. 07/26/2019 upon evaluation today patient appears to be doing well with regard to her wound. I do feel like we are seeing signs of improvement here it is measuring smaller and overall I am pleased. The wound on her back is doing much better as the sacral wound that is still open. Fortunately there is no signs of active infection at this time. 08/09/2019 upon evaluation today patient appears to be doing decently well with regard to her wound. Fortunately she seems to be showing some signs of improvement which is good news. There is no evidence of active infection at this time. No fevers, chills, nausea, vomiting, or diarrhea. Patient's wound bed currently showed signs of good granulation at this point overall I am extremely pleased with how things seem to be progressing. The patient likewise is happy to hear this. Electronic Signature(s) Signed: 08/09/2019 1:17:50 PM By: Worthy Keeler PA-C Entered By: Worthy Keeler on 08/09/2019 13:17:50 Sarah Colon, Sarah Colon ([id number]) -------------------------------------------------------------------------------- Physical Exam Details Patient Name: Sarah Colon. Date of Service: 08/09/2019 12:30 PM Medical Record Number: [id number] Patient Account Number: [id number] Date of Birth/Sex: 13-Aug-1945 (74 y.o. F) Treating RN: Army Melia Primary Care Provider: Jules Husbands Other Clinician: Referring Provider: Jules Husbands Treating Provider/Extender: Melburn Hake, Tiajah Oyster Weeks in Treatment: 5 Constitutional Well-nourished and well-hydrated in no acute distress. Respiratory normal breathing without difficulty. Psychiatric this patient is able to make  decisions and demonstrates good insight into disease process. Alert and Oriented x 3. pleasant and cooperative. Notes Patient's wound bed currently showed signs of good granulation at this time. Fortunately there is no evidence of active infection based on what we are seeing today. I am extremely pleased with how this seems to be progressing. There is also no evidence of have any discomfort at this time. No fevers, chills, nausea, vomiting, or diarrhea. Electronic Signature(s) Signed: 08/09/2019 1:19:00 PM By: Worthy Keeler PA-C Entered By: Worthy Keeler on 08/09/2019 13:19:00 Ketchum, Sarah Colon ([id number]) -------------------------------------------------------------------------------- Physician Orders Details Patient Name: Sarah Colon. Date of Service: 08/09/2019 12:30 PM Medical Record Number: [id number] Patient Account Number: [id number] Date of Birth/Sex: 03/14/1946 (74 y.o. F) Treating RN: Army Melia Primary Care Provider: Jules Husbands Other Clinician: Referring Provider: Jules Husbands Treating Provider/Extender: Melburn Hake, Inri Sobieski Weeks in Treatment: 5 Verbal / Phone Orders: No Diagnosis Coding ICD-10 Coding Code Description L89.153 Pressure ulcer of sacral region, stage 3 I10 Essential (primary) hypertension E11.622 Type 2 diabetes mellitus with other skin ulcer I48.20 Chronic atrial fibrillation, unspecified I50.42 Chronic combined systolic (congestive) and diastolic (congestive) heart failure Wound Cleansing Wound #2 Left Sacrum o Clean wound with Normal Saline. - in clinic o May Shower, gently pat wound dry prior to applying new dressing. Skin Barriers/Peri-Wound Care Wound #2 Left Sacrum o Skin Prep Primary Wound Dressing Wound #2 Left Sacrum o Hydrafera Blue Ready Transfer - packed into wound 12-12 8-12o'clock is the deeper undermining that needs to be filled with a single piece hydrafera blue Secondary Dressing Wound #2 Left Sacrum o  Boardered Foam Dressing Dressing Change Frequency Wound #2 Left Sacrum o Change dressing every day. o Other: - as needed for incontinence Follow-up Appointments Wound #2 Left Sacrum o Return Appointment in 2 weeks. Off-Loading Wound #2 Left Sacrum o Mattress - Pt needs air mattress o Turn and reposition every 2 hours Electronic Signature(s) Signed: 08/09/2019 3:43:50 PM By: Army Melia Signed: 08/10/2019 4:19:26 PM By: Worthy Keeler PA-C Entered By: Army Melia on 08/09/2019 13:07:18 Leiber, Sarah Colon. ([id number]) -------------------------------------------------------------------------------- Problem List Details Patient Name: Sarah Colon. Date of Service: 08/09/2019 12:30 PM Medical Record Number: [id number] Patient Account Number: [id number] Date of Birth/Sex: July 29, 1945 (74 y.o. F) Treating RN: Army Melia Primary Care Provider: Jules Husbands Other Clinician: Referring Provider: Jules Husbands Treating Provider/Extender: Melburn Hake, Marka Treloar Weeks in Treatment: 5 Active Problems ICD-10 Evaluated Encounter Code Description Active Date Today Diagnosis L89.153 Pressure ulcer of sacral region, stage 3 07/05/2019 No Yes I10 Essential (primary) hypertension 07/05/2019 No Yes E11.622 Type 2 diabetes mellitus with other skin ulcer 07/05/2019 No Yes I48.20 Chronic atrial fibrillation, unspecified 07/05/2019 No Yes I50.42 Chronic combined systolic (congestive) and diastolic (congestive) heart 07/05/2019 No Yes failure Inactive Problems Resolved Problems Electronic Signature(s) Signed: 08/09/2019 1:01:11 PM By: Worthy Keeler PA-C Entered By: Worthy Keeler on 08/09/2019 13:01:11 Lacroix, Sarah Colon. ([id number]) -------------------------------------------------------------------------------- Progress Note Details Patient Name: Sarah Colon. Date of Service: 08/09/2019 12:30 PM Medical Record Number: [id number] Patient Account Number: [id number] Date of Birth/Sex:  Oct 05, 1945 (74 y.o. 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 She states that this initially occurred about a year ago closed and then reopened several weeks ago though she does not have a specific timeframe for this. Unfortunately she states there is some discomfort at this time. She does have a history of atrial fibrillation, congestive heart failure, hypertension, diabetes mellitus type II, and her most recent hemoglobin A1c was 6.8 on 05/30/18. She did have a CT scan of the sacral region on 06/19/18 this was negative for osteomyelitis which is excellent news. Patient was on doxycycline two weeks ago and it appears based on record review that she also is currently on Cipro. There is some question about whether not she has a urinary tract infection. She did have a CBC performed which showed a white blood cell count at 20 which again indicates some kind of infection in my pinion. Again upon inspection today I seriously doubt that this is an infection secondary to the wound. I seen no signs of infection here. No fevers, chills, nausea, or vomiting noted at this time. The patient in fact states she feels really well. This again I feel like leads credence to the fact that she may have a urinary tract infection. 3/3/20Patient's wound bed currently shows evidence of great granulation at this time and does not appear to be signs of infection currently which is excellent news. It's actually fairly encouraging the wound is not doing any worse at this point. I am pleased with he address it has made. Readmission: 07/05/2019  on evaluation today patient presents for readmission she previously has been seen here in the clinic she is a referral from [facility] and rehabilitation center  in [place]. Fortunately there is no signs of the patient having any severe infection based on what we are seeing today which is great news she has not had osteomyelitis even in the past when we have seen her. With that being said she does tell me that she unfortunately spends the majority of her time in bed sitting up which I think is putting pressure on the region which is why this does get smaller than reopens and she is really not able to get this to heal and close appropriately. Fortunately she is not having any significant pain. She tells me she does not really go outside of her room due to Covid there are pretty much isolated in the room she has been very bored over the past year. 07/12/2019 on evaluation today patient appears to be doing much better in regard to her sacral wound at this point. She does have another small ulcer on her back in the lower portion of her back that is present today as well although that seems to be very superficial. In general she has tolerated the air mattress although it is somewhat cold to her they have not put a blanket between the air mattress in her body I think that would make a big difference. I will write an order for that today. 07/26/2019 upon evaluation today patient appears to be doing well with regard to her wound. I do feel like we are seeing signs of improvement here it is measuring smaller and overall I am pleased. The wound on her back is doing much better as the sacral wound that is still open. Fortunately there is no signs of active infection at this time. 08/09/2019 upon evaluation today patient appears to be doing decently well with regard to her wound. Fortunately she seems to be showing some signs of improvement which is good news. There is no evidence of active infection at  this time. No fevers, chills, nausea, vomiting, or diarrhea. Patient's wound bed currently showed signs of good granulation at this point overall I am extremely pleased with how things seem to be progressing. The patient likewise is happy to hear this. Objective Constitutional Well-nourished and well-hydrated in no acute distress. Vitals Time Taken: 12:40 PM, Height: 59 in, Weight: 112 lbs, BMI: 22.6, Temperature: 98.5 F, Pulse: 85 bpm, Respiratory Rate: 18 breaths/min, Blood Pressure: 148/74 mmHg. CRUMMYBuffey, Zabinski. ([id number]) Respiratory normal breathing without difficulty. Psychiatric this patient is able to make decisions and demonstrates good insight into disease process. Alert and Oriented x 3. pleasant and cooperative. General Notes: Patient's wound bed currently showed signs of good granulation at this time. Fortunately there is no evidence of active infection based on what we are seeing today. I am extremely pleased with how this seems to be progressing. There is also no evidence of have any discomfort at this time. No fevers, chills, nausea, vomiting, or diarrhea. Integumentary (Hair, Skin) Wound #2 status is Open. Original cause of wound was Pressure Injury. The wound is located on the Left Sacrum. The wound measures 1.1cm length x 1.2cm width x 0.7cm depth; 1.037cm^2 area and 0.726cm^3 volume. There is Fat Layer (Subcutaneous Tissue) Exposed exposed. There is no tunneling noted, however, there is undermining starting at 6:00 and ending at 2:00 with a maximum distance of 1.9cm. There is a medium amount of serous drainage noted. The wound margin is epibole. There is large (67-100%) red granulation within the wound bed. There is a small (1-33%) amount of necrotic tissue within the wound  bed including Adherent Slough. Assessment Active Problems ICD-10 Pressure ulcer of sacral region, stage 3 Essential (primary) hypertension Type 2 diabetes mellitus with other skin ulcer Chronic  atrial fibrillation, unspecified Chronic combined systolic (congestive) and diastolic (congestive) heart failure Plan Wound Cleansing: Wound #2 Left Sacrum: Clean wound with Normal Saline. - in clinic May Shower, gently pat wound dry prior to applying new dressing. Skin Barriers/Peri-Wound Care: Wound #2 Left Sacrum: Skin Prep Primary Wound Dressing: Wound #2 Left Sacrum: Hydrafera Blue Ready Transfer - packed into wound 12-12 8-12o'clock is the deeper undermining that needs to be filled with a single piece hydrafera blue Secondary Dressing: Wound #2 Left Sacrum: Boardered Foam Dressing Dressing Change Frequency: Wound #2 Left Sacrum: Change dressing every day. Other: - as needed for incontinence Follow-up Appointments: Wound #2 Left Sacrum: Return Appointment in 2 weeks. Off-Loading: Wound #2 Left Sacrum: Mattress - Pt needs air mattress Turn and reposition every 2 hours 1. Based on what I am seeing I do believe that the patient is making excellent progress with regard to her wound at this point. I am extremely pleased with how the wound is granulating and the measurements are smaller and overall I am very pleased with the progress. Nonetheless I do still think she Fridman, Sarah Colon. ([id number]) has some ways to go to get this completely healed. We will continue with packing utilizing the [facility] dressing. 2. The good news is it does look like she is can be staying in the facility where she can receive better care this obviously I think is good news. 3. I am also can recommend that we continue with the bordered foam dressing to cover that seems to be doing well for her. We will see patient back for reevaluation in 1 week here in the clinic. If anything worsens or changes patient will contact our office for additional recommendations. Electronic Signature(s) Signed: 08/09/2019 1:20:45 PM By: Worthy Keeler PA-C Previous Signature: 08/09/2019 1:19:52 PM Version By: Worthy Keeler PA-C Entered By: Worthy Keeler on 08/09/2019 13:20:45 Stanczyk, Sarah Colon ([id number]) -------------------------------------------------------------------------------- SuperBill Details Patient Name: Sarah Colon. Date of Service: 08/09/2019 Medical Record Number: [id number] Patient Account Number: [id number] Date of Birth/Sex: [birth year] (74 y.o. F) Treating RN: Army Melia Primary Care Provider: Jules Husbands Other Clinician: Referring Provider: Jules Husbands Treating Provider/Extender: Melburn Hake, Carollee Nussbaumer Weeks in Treatment: 5 Diagnosis Coding ICD-10 Codes Code Description L89.153 Pressure ulcer of sacral region, stage 3 I10 Essential (primary) hypertension E11.622 Type 2 diabetes mellitus with other skin ulcer I48.20 Chronic atrial fibrillation, unspecified I50.42 Chronic combined systolic (congestive) and diastolic (congestive) heart failure Facility Procedures CPT4 Code: [id number] Description: 99213 - WOUND CARE VISIT-LEV 3 EST PT Modifier: Quantity: 1 Physician Procedures CPT4 Code: [id number] Description: 99214 - WC PHYS LEVEL 4 - EST PT Modifier: Quantity: 1 CPT4 Code: Description: ICD-10 Diagnosis Description L89.153 Pressure ulcer of sacral region, stage 3 I10 Essential (primary) hypertension E11.622 Type 2 diabetes mellitus with other skin ulcer I48.20 Chronic atrial fibrillation, unspecified Modifier: Quantity: Electronic Signature(s) Signed: 08/09/2019 1:20:53 PM By: Worthy Keeler PA-C Previous Signature: 08/09/2019 1:20:10 PM Version By: Worthy Keeler PA-C Entered By: Worthy Keeler on 08/09/2019 13:20:52

## 2019-08-23 ENCOUNTER — Ambulatory Visit: Payer: Medicare Other | Admitting: Physician Assistant

## 2019-08-30 ENCOUNTER — Encounter: Payer: Medicare Other | Attending: Physician Assistant | Admitting: Physician Assistant

## 2019-08-30 ENCOUNTER — Other Ambulatory Visit: Payer: Self-pay

## 2019-08-30 DIAGNOSIS — E1122 Type 2 diabetes mellitus with diabetic chronic kidney disease: Secondary | ICD-10-CM | POA: Diagnosis not present

## 2019-08-30 DIAGNOSIS — I482 Chronic atrial fibrillation, unspecified: Secondary | ICD-10-CM | POA: Diagnosis not present

## 2019-08-30 DIAGNOSIS — I5042 Chronic combined systolic (congestive) and diastolic (congestive) heart failure: Secondary | ICD-10-CM | POA: Insufficient documentation

## 2019-08-30 DIAGNOSIS — I132 Hypertensive heart and chronic kidney disease with heart failure and with stage 5 chronic kidney disease, or end stage renal disease: Secondary | ICD-10-CM | POA: Diagnosis not present

## 2019-08-30 DIAGNOSIS — Z6823 Body mass index (BMI) 23.0-23.9, adult: Secondary | ICD-10-CM | POA: Insufficient documentation

## 2019-08-30 DIAGNOSIS — L89153 Pressure ulcer of sacral region, stage 3: Secondary | ICD-10-CM | POA: Insufficient documentation

## 2019-08-30 DIAGNOSIS — N186 End stage renal disease: Secondary | ICD-10-CM | POA: Insufficient documentation

## 2019-08-30 DIAGNOSIS — E11622 Type 2 diabetes mellitus with other skin ulcer: Secondary | ICD-10-CM | POA: Insufficient documentation

## 2019-08-30 DIAGNOSIS — E1151 Type 2 diabetes mellitus with diabetic peripheral angiopathy without gangrene: Secondary | ICD-10-CM | POA: Diagnosis not present

## 2019-08-30 NOTE — Progress Notes (Addendum)
Sarah Colon, Sarah Colon ([id number]) Visit Report for 08/30/2019 Chief Complaint Document Details Patient Name: Sarah Colon, Sarah Colon. Date of Service: 08/30/2019 1:45 PM Medical Record Number: [id number] Patient Account Number: [id number] Date of Birth/Sex: [birth year] (74 y.o. F) Treating RN: Army Melia Primary Care Provider: Jules Husbands Other Clinician: Referring Provider: Jules Husbands Treating Provider/Extender: Melburn Hake, Madelynn Malson Weeks in Treatment: 8 Information Obtained from: Patient Chief Complaint Sacral Pressure ulcer on the left Electronic Signature(s) Signed: 08/30/2019 2:03:57 PM By: Worthy Keeler PA-C Entered By: Worthy Keeler on 08/30/2019 14:03:57 [name], [name] ([id number]) -------------------------------------------------------------------------------- HPI Details Patient Name: Sarah Reach D. Date of Service: 08/30/2019 1:45 PM Medical Record Number: [id number] Patient Account Number: [id number] Date of Birth/Sex: 1946-05-27 (74 y.o. 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 She states that this initially occurred about a year ago closed and then reopened several weeks ago though she does not have a specific timeframe for this. Unfortunately she states there is some discomfort at this time. She does have a history of atrial fibrillation, congestive heart failure, hypertension, diabetes mellitus type II, and her most recent hemoglobin A1c was 6.8 on 05/30/18. She did have a CT scan of the sacral region on 06/19/18 this was negative for osteomyelitis which is excellent news. Patient was on doxycycline two weeks ago and it appears based on record review that she also is  currently on Cipro. There is some question about whether not she has a urinary tract infection. She did have a CBC performed which showed a white blood cell count at 20 which again indicates some kind of infection in my pinion. Again upon inspection today I seriously doubt that this is an infection secondary to the wound. I seen no signs of infection here. No fevers, chills, nausea, or vomiting noted at this time. The patient in fact states she feels really well. This again I feel like leads credence to the fact that she may have a urinary tract infection. 3/3/20Patient's wound bed currently shows evidence of great granulation at this time and does not appear to be signs of infection currently which is excellent news. It's actually fairly encouraging the wound is not doing any worse at this point. I am pleased with he address it has made. Readmission: 07/05/2019 on evaluation today patient presents for readmission she previously has been seen here in the clinic she is a referral from [facility] and rehabilitation center in [place]. Fortunately there is no signs of the patient having any severe infection based on what we are seeing today which is great news she has not had osteomyelitis even in the past when we have seen her. With that being said she does tell me that she unfortunately spends the majority of her time in bed sitting up which I think is putting pressure on the region which is why this does get smaller than reopens and she is really not able to get this to heal and close appropriately. Fortunately she is not having any significant pain. She tells me she does not really go outside of her room due to Covid there are pretty much isolated in the room she has been very bored over the past year. 07/12/2019 on evaluation today patient appears to be doing much  better in regard to her sacral wound at this point. She does have another small ulcer on her back in the lower portion of her  back that is present today as well although that seems to be very superficial. In general she has tolerated the air mattress although it is somewhat cold to her they have not put a blanket between the air mattress in her body I think that would make a big difference. I will write an order for that today. 07/26/2019 upon evaluation today patient appears to be doing well with regard to her wound. I do feel like we are seeing signs of improvement here it is measuring smaller and overall I am pleased. The wound on her back is doing much better as the sacral wound that is still open. Fortunately there is no signs of active infection at this time. 08/09/2019 upon evaluation today patient appears to be doing decently well with regard to her wound. Fortunately she seems to be showing some signs of improvement which is good news. There is no evidence of active infection at this time. No fevers, chills, nausea, vomiting, or diarrhea. Patient's wound bed currently showed signs of good granulation at this point overall I am extremely pleased with how things seem to be progressing. The patient likewise is happy to hear this. 08/30/2019 upon evaluation today patient appears to be doing well with regard to her wound. This is actually measuring somewhat better. I feel like each time we see her she is doing a little better each time. Overall I am very pleased with the progress she is made. Electronic Signature(s) Signed: 08/30/2019 2:50:48 PM By: Worthy Keeler PA-C Entered By: Worthy Keeler on 08/30/2019 14:50:47 Sarah Colon, Sarah Colon ([id number]) -------------------------------------------------------------------------------- Physical Exam Details Patient Name: Sarah Reach D. Date of Service: 08/30/2019 1:45 PM Medical Record Number: [id number] Patient Account Number: [id number] Date of Birth/Sex: 05/04/1946 (74 y.o. F) Treating RN: Army Melia Primary Care Provider: Jules Husbands Other Clinician: Referring  Provider: Jules Husbands Treating Provider/Extender: Melburn Hake, Jeevan Kalla Weeks in Treatment: 8 Constitutional Well-nourished and well-hydrated in no acute distress. Respiratory normal breathing without difficulty. Psychiatric this patient is able to make decisions and demonstrates good insight into disease process. Alert and Oriented x 3. pleasant and cooperative. Notes His wound bed currently showed signs of good granulation. There did not appear to be any signs of infection at this point which is great news. Overall I am very pleased with how she seems to be progressing. Electronic Signature(s) Signed: 08/30/2019 2:51:05 PM By: Worthy Keeler PA-C Entered By: Worthy Keeler on 08/30/2019 14:51:03 Sarah Colon, Sarah Colon ([id number]) -------------------------------------------------------------------------------- Physician Orders Details Patient Name: Sarah Reach D. Date of Service: 08/30/2019 1:45 PM Medical Record Number: [id number] Patient Account Number: [id number] Date of Birth/Sex: 1946-03-30 (74 y.o. F) Treating RN: Army Melia Primary Care Provider: Jules Husbands Other Clinician: Referring Provider: Jules Husbands Treating Provider/Extender: Melburn Hake, Jonell Brumbaugh Weeks in Treatment: 8 Verbal / Phone Orders: No Diagnosis Coding ICD-10 Coding Code Description L89.153 Pressure ulcer of sacral region, stage 3 I10 Essential (primary) hypertension E11.622 Type 2 diabetes mellitus with other skin ulcer I48.20 Chronic atrial fibrillation, unspecified I50.42 Chronic combined systolic (congestive) and diastolic (congestive) heart failure Wound Cleansing Wound #2 Left Sacrum o Clean wound with Normal Saline. - in clinic o May Shower, gently pat wound dry prior to applying new dressing. Skin Barriers/Peri-Wound Care Wound #2 Left Sacrum o Skin Prep Primary Wound Dressing Wound #2 Left Sacrum o Hydrafera Blue Ready Transfer -  packed into wound 12-12 8-12o'clock is the deeper  undermining that needs to be filled with a single piece hydrafera blue Secondary Dressing Wound #2 Left Sacrum o Boardered Foam Dressing Dressing Change Frequency Wound #2 Left Sacrum o Change dressing every day. o Other: - as needed for incontinence Follow-up Appointments Wound #2 Left Sacrum o Return Appointment in 2 weeks. Off-Loading Wound #2 Left Sacrum o Mattress - Pt needs air mattress o Turn and reposition every 2 hours Electronic Signature(s) Signed: 08/30/2019 3:58:38 PM By: Army Melia Signed: 08/30/2019 5:29:46 PM By: Worthy Keeler PA-C Entered By: Army Melia on 08/30/2019 14:44:38 Sarah Colon, [name] ([id number]) -------------------------------------------------------------------------------- Problem List Details Patient Name: Sarah Reach D. Date of Service: 08/30/2019 1:45 PM Medical Record Number: [id number] Patient Account Number: [id number] Date of Birth/Sex: [birth year] (74 y.o. F) Treating RN: Army Melia Primary Care Provider: Jules Husbands Other Clinician: Referring Provider: Jules Husbands Treating Provider/Extender: Melburn Hake, Winthrop Shannahan Weeks in Treatment: 8 Active Problems ICD-10 Evaluated Encounter Code Description Active Date Today Diagnosis L89.153 Pressure ulcer of sacral region, stage 3 07/05/2019 No Yes I10 Essential (primary) hypertension 07/05/2019 No Yes E11.622 Type 2 diabetes mellitus with other skin ulcer 07/05/2019 No Yes I48.20 Chronic atrial fibrillation, unspecified 07/05/2019 No Yes I50.42 Chronic combined systolic (congestive) and diastolic (congestive) heart 07/05/2019 No Yes failure Inactive Problems Resolved Problems Electronic Signature(s) Signed: 08/30/2019 2:03:50 PM By: Worthy Keeler PA-C Entered By: Worthy Keeler on 08/30/2019 14:03:49 Sarah Colon, [name] ([id number]) -------------------------------------------------------------------------------- Progress Note Details Patient Name: Sarah Reach D. Date of Service:  08/30/2019 1:45 PM Medical Record Number: [id number] Patient Account Number: [id number] Date of Birth/Sex: [birth year] (74 y.o. 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 She states that this initially occurred about a year ago closed and then reopened several weeks ago though she does not have a specific timeframe for this. Unfortunately she states there is some discomfort at this time. She does have a history of atrial fibrillation, congestive heart failure, hypertension, diabetes mellitus type II, and her most recent hemoglobin A1c was 6.8 on 05/30/18. She did have a CT scan of the sacral region on 06/19/18 this was negative for osteomyelitis which is excellent news. Patient was on doxycycline two weeks ago and it appears based on record review that she also is currently on Cipro. There is some question about whether not she has a urinary tract infection. She did have a CBC performed which showed a white blood cell count at 20 which again indicates some kind of infection in my pinion. Again upon inspection today I seriously doubt that this is an infection secondary to the wound. I seen no signs of infection here. No fevers, chills, nausea, or vomiting noted at this time. The patient in fact states she feels really well. This again I feel like leads credence to the fact that she may have a urinary tract infection. 3/3/20Patient's wound bed currently shows evidence of great granulation at this time and does not appear to be signs of infection currently which is excellent news. It's actually fairly encouraging the  wound is not doing any worse at this point. I am pleased with he address it has made. Readmission:  07/05/2019 on evaluation today patient presents for readmission she previously has been seen here in the clinic she is a referral from [facility] and rehabilitation center in [place]. Fortunately there is no signs of the patient having any severe infection based on what we are seeing today which is great news she has not had osteomyelitis even in the past when we have seen her. With that being said she does tell me that she unfortunately spends the majority of her time in bed sitting up which I think is putting pressure on the region which is why this does get smaller than reopens and she is really not able to get this to heal and close appropriately. Fortunately she is not having any significant pain. She tells me she does not really go outside of her room due to Covid there are pretty much isolated in the room she has been very bored over the past year. 07/12/2019 on evaluation today patient appears to be doing much better in regard to her sacral wound at this point. She does have another small ulcer on her back in the lower portion of her back that is present today as well although that seems to be very superficial. In general she has tolerated the air mattress although it is somewhat cold to her they have not put a blanket between the air mattress in her body I think that would make a big difference. I will write an order for that today. 07/26/2019 upon evaluation today patient appears to be doing well with regard to her wound. I do feel like we are seeing signs of improvement here it is measuring smaller and overall I am pleased. The wound on her back is doing much better as the sacral wound that is still open. Fortunately there is no signs of active infection at this time. 08/09/2019 upon evaluation today patient appears to be doing decently well with regard to her wound. Fortunately she  seems to be showing some signs of improvement which is good news. There is no evidence of active infection at this time. No fevers, chills, nausea, vomiting, or diarrhea. Patient's wound bed currently showed signs of good granulation at this point overall I am extremely pleased with how things seem to be progressing. The patient likewise is happy to hear this. 08/30/2019 upon evaluation today patient appears to be doing well with regard to her wound. This is actually measuring somewhat better. I feel like each time we see her she is doing a little better each time. Overall I am very pleased with the progress she is made. Objective Constitutional Well-nourished and well-hydrated in no acute distress. Sarah Colon, Sarah Colon ([id number]) Vitals Time Taken: 2:05 PM, Height: 59 in, Weight: 112 lbs, BMI: 22.6, Temperature: 98.7 F, Pulse: 75 bpm, Respiratory Rate: 16 breaths/min, Blood Pressure: 157/70 mmHg. Respiratory normal breathing without difficulty. Psychiatric this patient is able to make decisions and demonstrates good insight into disease process. Alert and Oriented x 3. pleasant and cooperative. General Notes: His wound bed currently showed signs of good granulation. There did not appear to be any signs of infection at this point which is great news. Overall I am very pleased with how she seems to be progressing. Integumentary (Hair, Skin) Wound #2 status is Open. Original cause of wound was Pressure Injury. The wound is located on the Left Sacrum. The wound measures 1cm length x 0.7cm width x 0.5cm depth; 0.55cm^2 area and 0.275cm^3 volume. There is Fat Layer (Subcutaneous Tissue) Exposed exposed. There  is undermining starting at 10:00 and ending at 10:00 with a maximum distance of 1.3cm. There is a medium amount of serous drainage noted. The wound margin is epibole. There is large (67-100%) granulation within the wound bed. There is no necrotic tissue within the wound bed. Assessment Active  Problems ICD-10 Pressure ulcer of sacral region, stage 3 Essential (primary) hypertension Type 2 diabetes mellitus with other skin ulcer Chronic atrial fibrillation, unspecified Chronic combined systolic (congestive) and diastolic (congestive) heart failure Plan Wound Cleansing: Wound #2 Left Sacrum: Clean wound with Normal Saline. - in clinic May Shower, gently pat wound dry prior to applying new dressing. Skin Barriers/Peri-Wound Care: Wound #2 Left Sacrum: Skin Prep Primary Wound Dressing: Wound #2 Left Sacrum: Hydrafera Blue Ready Transfer - packed into wound 12-12 8-12o'clock is the deeper undermining that needs to be filled with a single piece hydrafera blue Secondary Dressing: Wound #2 Left Sacrum: Boardered Foam Dressing Dressing Change Frequency: Wound #2 Left Sacrum: Change dressing every day. Other: - as needed for incontinence Follow-up Appointments: Wound #2 Left Sacrum: Return Appointment in 2 weeks. Off-Loading: Wound #2 Left Sacrum: Mattress - Pt needs air mattress Turn and reposition every 2 hours Sarah Colon, [name] ([id number]) 1. My suggestion currently is good to be that we go ahead and initiate treatment with a continuation of the [facility] dressing that is done well for her up to this point. 2. I am also can recommend currently we continue with a border foam to cover. 3. I recommend that she continue with appropriate offloading I do believe the air mattress has benefited her as well. We will see patient back for reevaluation in 1 week here in the clinic. If anything worsens or changes patient will contact our office for additional recommendations. Electronic Signature(s) Signed: 08/30/2019 2:52:04 PM By: Worthy Keeler PA-C Entered By: Worthy Keeler on 08/30/2019 14:52:04 Sarah Colon, Sarah Colon Kitchen ([id number]) -------------------------------------------------------------------------------- SuperBill Details Patient Name: Sarah Reach D. Date of  Service: 08/30/2019 Medical Record Number: [id number] Patient Account Number: [id number] Date of Birth/Sex: [birth year] (74 y.o. F) Treating RN: Army Melia Primary Care Provider: Jules Husbands Other Clinician: Referring Provider: Jules Husbands Treating Provider/Extender: Melburn Hake, Aniko Finnigan Weeks in Treatment: 8 Diagnosis Coding ICD-10 Codes Code Description L89.153 Pressure ulcer of sacral region, stage 3 I10 Essential (primary) hypertension E11.622 Type 2 diabetes mellitus with other skin ulcer I48.20 Chronic atrial fibrillation, unspecified I50.42 Chronic combined systolic (congestive) and diastolic (congestive) heart failure Facility Procedures CPT4 Code: [id number] Description: 99213 - WOUND CARE VISIT-LEV 3 EST PT Modifier: Quantity: 1 Physician Procedures CPT4 Code: [id number] Description: 99213 - WC PHYS LEVEL 3 - EST PT Modifier: Quantity: 1 CPT4 Code: Description: ICD-10 Diagnosis Description L89.153 Pressure ulcer of sacral region, stage 3 I10 Essential (primary) hypertension E11.622 Type 2 diabetes mellitus with other skin ulcer I48.20 Chronic atrial fibrillation, unspecified Modifier: Quantity: Electronic Signature(s) Signed: 08/30/2019 2:52:19 PM By: Worthy Keeler PA-C Entered By: Worthy Keeler on 08/30/2019 14:52:18

## 2019-08-30 NOTE — Progress Notes (Addendum)
Sarah, Colon ([id number]) Visit Report for 08/30/2019 Arrival Information Details Patient Name: Sarah Colon, Sarah Colon. Date of Service: 08/30/2019 1:45 PM Medical Record Number: [id number] Patient Account Number: [id number] Date of Birth/Sex: 03/04/1946 (74 y.o. F) Treating RN: Cornell Barman Primary Care Cyana Shook: Jules Husbands Other Clinician: Referring Nare Gaspari: Jules Husbands Treating Margrit Minner/Extender: Melburn Hake, HOYT Weeks in Treatment: 8 Visit Information History Since Last Visit Added or deleted any medications: No Patient Arrived: Wheel Chair Any new allergies or adverse reactions: No Arrival Time: 14:03 Had a fall or experienced change in No Accompanied By: self activities of daily living that may affect Transfer Assistance: Harrel Lemon Lift risk of falls: Patient Identification Verified: Yes Signs or symptoms of abuse/neglect since last visito No Secondary Verification Process Completed: Yes Hospitalized since last visit: No Implantable device outside of the clinic excluding No cellular tissue based products placed in the center since last visit: Has Dressing in Place as Prescribed: Yes Pain Present Now: No Electronic Signature(s) Signed: 08/30/2019 5:42:19 PM By: Gretta Cool, BSN, RN, CWS, Kim RN, BSN Entered By: Gretta Cool, BSN, RN, CWS, Kim on 08/30/2019 14:04:59 Reeg, [name] ([id number]) -------------------------------------------------------------------------------- Clinic Level of Care Assessment Details Patient Name: Sarah Reach D. Date of Service: 08/30/2019 1:45 PM Medical Record Number: [id number] Patient Account Number: [id number] Date of Birth/Sex: Jan 27, 1946 (74 y.o. F) Treating RN: Army Melia Primary Care Alva Kuenzel: Jules Husbands Other Clinician: Referring Aubrynn Katona: Jules Husbands Treating Sherol Sabas/Extender: Melburn Hake, HOYT Weeks in Treatment: 8 Clinic Level of Care Assessment Items TOOL 4 Quantity Score []  - Use when only an EandM is performed on FOLLOW-UP  visit 0 ASSESSMENTS - Nursing Assessment / Reassessment X - Reassessment of Co-morbidities (includes updates in patient status) 1 10 X- 1 5 Reassessment of Adherence to Treatment Plan ASSESSMENTS - Wound and Skin Assessment / Reassessment X - Simple Wound Assessment / Reassessment - one wound 1 5 []  - 0 Complex Wound Assessment / Reassessment - multiple wounds []  - 0 Dermatologic / Skin Assessment (not related to wound area) ASSESSMENTS - Focused Assessment []  - Circumferential Edema Measurements - multi extremities 0 []  - 0 Nutritional Assessment / Counseling / Intervention []  - 0 Lower Extremity Assessment (monofilament, tuning fork, pulses) []  - 0 Peripheral Arterial Disease Assessment (using hand held doppler) ASSESSMENTS - Ostomy and/or Continence Assessment and Care []  - Incontinence Assessment and Management 0 []  - 0 Ostomy Care Assessment and Management (repouching, etc.) PROCESS - Coordination of Care X - Simple Patient / Family Education for ongoing care 1 15 []  - 0 Complex (extensive) Patient / Family Education for ongoing care []  - 0 Staff obtains Programmer, systems, Records, Test Results / Process Orders []  - 0 Staff telephones HHA, Nursing Homes / Clarify orders / etc []  - 0 Routine Transfer to another Facility (non-emergent condition) []  - 0 Routine Hospital Admission (non-emergent condition) []  - 0 New Admissions / Biomedical engineer / Ordering NPWT, Apligraf, etc. []  - 0 Emergency Hospital Admission (emergent condition) X- 1 10 Simple Discharge Coordination []  - 0 Complex (extensive) Discharge Coordination PROCESS - Special Needs []  - Pediatric / Minor Patient Management 0 []  - 0 Isolation Patient Management []  - 0 Hearing / Language / Visual special needs []  - 0 Assessment of Community assistance (transportation, D/C planning, etc.) Wohlers, Jacquelinne D. ([id number]) []  - 0 Additional assistance / Altered mentation []  - 0 Support Surface(s) Assessment  (bed, cushion, seat, etc.) INTERVENTIONS - Wound Cleansing / Measurement X - Simple Wound Cleansing - one wound 1 5 []  - 0 Complex  Wound Cleansing - multiple wounds X- 1 5 Wound Imaging (photographs - any number of wounds) []  - 0 Wound Tracing (instead of photographs) X- 1 5 Simple Wound Measurement - one wound []  - 0 Complex Wound Measurement - multiple wounds INTERVENTIONS - Wound Dressings []  - Small Wound Dressing one or multiple wounds 0 X- 1 15 Medium Wound Dressing one or multiple wounds []  - 0 Large Wound Dressing one or multiple wounds []  - 0 Application of Medications - topical []  - 0 Application of Medications - injection INTERVENTIONS - Miscellaneous []  - External ear exam 0 []  - 0 Specimen Collection (cultures, biopsies, blood, body fluids, etc.) []  - 0 Specimen(s) / Culture(s) sent or taken to Lab for analysis []  - 0 Patient Transfer (multiple staff / Civil Service fast streamer / Similar devices) []  - 0 Simple Staple / Suture removal (25 or less) []  - 0 Complex Staple / Suture removal (26 or more) []  - 0 Hypo / Hyperglycemic Management (close monitor of Blood Glucose) []  - 0 Ankle / Brachial Index (ABI) - do not check if billed separately X- 1 5 Vital Signs Has the patient been seen at the hospital within the last three years: Yes Total Score: 80 Level Of Care: New/Established - Level 3 Electronic Signature(s) Signed: 08/30/2019 3:58:38 PM By: Army Melia Entered By: Army Melia on 08/30/2019 14:45:04 Claflin, Lincy DMarland Kitchen ([id number]) -------------------------------------------------------------------------------- Encounter Discharge Information Details Patient Name: Sarah Reach D. Date of Service: 08/30/2019 1:45 PM Medical Record Number: [id number] Patient Account Number: [id number] Date of Birth/Sex: 07-22-1945 (74 y.o. F) Treating RN: Army Melia Primary Care Caylin Raby: Jules Husbands Other Clinician: Referring Glory Graefe: Jules Husbands Treating  Sarah Colon/Extender: Melburn Hake, HOYT Weeks in Treatment: 8 Encounter Discharge Information Items Discharge Condition: Stable Ambulatory Status: Wheelchair Discharge Destination: Skilled Nursing Facility Telephoned: No Orders Sent: Yes Transportation: Private Auto Accompanied By: self Schedule Follow-up Appointment: Yes Clinical Summary of Care: Electronic Signature(s) Signed: 08/30/2019 3:58:38 PM By: Army Melia Entered By: Army Melia on 08/30/2019 14:45:44 Sinclair, Laketha D. ([id number]) -------------------------------------------------------------------------------- Lower Extremity Assessment Details Patient Name: Sarah Reach D. Date of Service: 08/30/2019 1:45 PM Medical Record Number: [id number] Patient Account Number: [id number] Date of Birth/Sex: 08-19-1945 (74 y.o. F) Treating RN: Cornell Barman Primary Care Dayna Alia: Jules Husbands Other Clinician: Referring Nash Bolls: Jules Husbands Treating Elya Diloreto/Extender: Sharalyn Ink in Treatment: 8 Electronic Signature(s) Signed: 08/30/2019 5:42:19 PM By: Gretta Cool, BSN, RN, CWS, Kim RN, BSN Entered By: Gretta Cool, BSN, RN, CWS, Kim on 08/30/2019 14:05:22 [name] ([id number]) -------------------------------------------------------------------------------- Multi Wound Chart Details Patient Name: Sarah Reach D. Date of Service: 08/30/2019 1:45 PM Medical Record Number: [id number] Patient Account Number: [id number] Date of Birth/Sex: Nov 10, 1945 (74 y.o. F) Treating RN: Army Melia Primary Care Perlie Scheuring: Jules Husbands Other Clinician: Referring Docie Abramovich: Jules Husbands Treating Luke Falero/Extender: Melburn Hake, HOYT Weeks in Treatment: 8 Vital Signs Height(in): 59 Pulse(bpm): 75 Weight(lbs): 112 Blood Pressure(mmHg): 157/70 Body Mass Index(BMI): 23 Temperature(F): 98.7 Respiratory Rate(breaths/min): 16 Photos: [N/A:N/A] Wound Location: Left Sacrum N/A N/A Wounding Event: Pressure Injury N/A N/A Primary Etiology:  Pressure Ulcer N/A N/A Comorbid History: Cataracts, Glaucoma, Arrhythmia, N/A N/A Congestive Heart Failure, Hypertension, Type II Diabetes, End Stage Renal Disease, History of pressure wounds Date Acquired: 07/01/2018 N/A N/A Weeks of Treatment: 8 N/A N/A Wound Status: Open N/A N/A Measurements L x W x D (cm) 1x0.7x0.5 N/A N/A Area (cm) : 0.55 N/A N/A Volume (cm) : 0.275 N/A N/A % Reduction in Area: 70.80% N/A N/A % Reduction in Volume: 89.60% N/A N/A Starting  Position 1 (o'clock): 10 Ending Position 1 (o'clock): 10 Maximum Distance 1 (cm): 1.3 Undermining: Yes N/A N/A Classification: Category/Stage III N/A N/A Exudate Amount: Medium N/A N/A Exudate Type: Serous N/A N/A Exudate Color: amber N/A N/A Wound Margin: Epibole N/A N/A Granulation Amount: Large (67-100%) N/A N/A Necrotic Amount: None Present (0%) N/A N/A Exposed Structures: Fat Layer (Subcutaneous Tissue) N/A N/A Exposed: Yes Fascia: No Tendon: No Muscle: No Joint: No Bone: No Epithelialization: Large (67-100%) N/A N/A Treatment Notes AHMANI, DAOUD ([id number]) Electronic Signature(s) Signed: 08/30/2019 3:58:38 PM By: Army Melia Entered By: Army Melia on 08/30/2019 14:44:06 Yurko, Kimiya Keturah Barre ([id number]) -------------------------------------------------------------------------------- Multi-Disciplinary Care Plan Details Patient Name: Sarah Reach D. Date of Service: 08/30/2019 1:45 PM Medical Record Number: [id number] Patient Account Number: [id number] Date of Birth/Sex: September 11, 1945 (74 y.o. F) Treating RN: Army Melia Primary Care Kanitra Purifoy: Jules Husbands Other Clinician: Referring Yecheskel Kurek: Jules Husbands Treating Jessice Madill/Extender: Melburn Hake, HOYT Weeks in Treatment: 8 Active Inactive Abuse / Safety / Falls / Self Care Management Nursing Diagnoses: Potential for injury related to transfers Goals: Patient/caregiver will verbalize/demonstrate measures taken to improve the patient's personal  safety Date Initiated: 07/05/2019 Target Resolution Date: 08/02/2019 Goal Status: Active Interventions: Provide education on fall prevention Notes: Pressure Nursing Diagnoses: Knowledge deficit related to management of pressures ulcers Goals: Patient/caregiver will verbalize risk factors for pressure ulcer development Date Initiated: 07/05/2019 Target Resolution Date: 08/02/2019 Goal Status: Active Interventions: Assess offloading mechanisms upon admission and as needed Notes: Wound/Skin Impairment Nursing Diagnoses: Impaired tissue integrity Goals: Ulcer/skin breakdown will have a volume reduction of 30% by week 4 Date Initiated: 07/05/2019 Target Resolution Date: 08/02/2019 Goal Status: Active Interventions: Assess ulceration(s) every visit Notes: Electronic Signature(s) Signed: 08/30/2019 3:58:38 PM By: Army Melia Entered By: Army Melia on 08/30/2019 14:43:58 Jasek, Karleigh D. ([id number]) Dittrich, Prince D. ([id number]) -------------------------------------------------------------------------------- Pain Assessment Details Patient Name: Sarah Reach D. Date of Service: 08/30/2019 1:45 PM Medical Record Number: [id number] Patient Account Number: [id number] Date of Birth/Sex: April 23, 1946 (74 y.o. F) Treating RN: Cornell Barman Primary Care Curtis Uriarte: Jules Husbands Other Clinician: Referring Yadriel Kerrigan: Jules Husbands Treating Draken Farrior/Extender: Melburn Hake, HOYT Weeks in Treatment: 8 Active Problems Location of Pain Severity and Description of Pain Patient Has Paino No Site Locations Pain Management and Medication Current Pain Management: Electronic Signature(s) Signed: 08/30/2019 5:42:19 PM By: Gretta Cool, BSN, RN, CWS, Kim RN, BSN Entered By: Gretta Cool, BSN, RN, CWS, Kim on 08/30/2019 14:14:46 Herbel, [name] ([id number]) -------------------------------------------------------------------------------- Patient/Caregiver Education Details Patient Name: Sarah Reach D. Date of  Service: 08/30/2019 1:45 PM Medical Record Number: [id number] Patient Account Number: [id number] Date of Birth/Gender: February 01, 1946 (74 y.o. F) Treating RN: Army Melia Primary Care Physician: Jules Husbands Other Clinician: Referring Physician: Jules Husbands Treating Physician/Extender: Sharalyn Ink in Treatment: 8 Education Assessment Education Provided To: Patient Education Topics Provided Wound/Skin Impairment: Handouts: Caring for Your Ulcer Methods: Demonstration, Explain/Verbal Responses: State content correctly Electronic Signature(s) Signed: 08/30/2019 3:58:38 PM By: Army Melia Entered By: Army Melia on 08/30/2019 14:45:15 Shenk, Rachal D. ([id number]) -------------------------------------------------------------------------------- Wound Assessment Details Patient Name: Sarah Reach D. Date of Service: 08/30/2019 1:45 PM Medical Record Number: [id number] Patient Account Number: [id number] Date of Birth/Sex: Feb 03, 1946 (74 y.o. F) Treating RN: Cornell Barman Primary Care Hervey Wedig: Jules Husbands Other Clinician: Referring Kaelee Pfeffer: Jules Husbands Treating Hong Moring/Extender: Melburn Hake, HOYT Weeks in Treatment: 8 Wound Status Wound Number: 2 Primary Pressure Ulcer Etiology: Wound Location: Left Sacrum Wound Open Wounding Event: Pressure Injury Status: Date Acquired: 07/01/2018 Comorbid Cataracts, Glaucoma, Arrhythmia, Congestive Heart Failure, Weeks  Of Treatment: 8 History: Hypertension, Type II Diabetes, End Stage Renal Disease, Clustered Wound: No History of pressure wounds Photos Wound Measurements Length: (cm) 1 % Reduc Width: (cm) 0.7 % Reduc Depth: (cm) 0.5 Epithel Area: (cm) 0.55 Underm Volume: (cm) 0.275 Sta Endi Maxi tion in Area: 70.8% tion in Volume: 89.6% ialization: Large (67-100%) ining: Yes rting Position (o'clock): 10 ng Position (o'clock): 10 mum Distance: (cm) 1.3 Wound Description Classification: Category/Stage III Foul  Od Wound Margin: Epibole Slough/ Exudate Amount: Medium Exudate Type: Serous Exudate Color: amber or After Cleansing: No Fibrino Yes Wound Bed Granulation Amount: Large (67-100%) Exposed Structure Necrotic Amount: None Present (0%) Fascia Exposed: No Fat Layer (Subcutaneous Tissue) Exposed: Yes Tendon Exposed: No Muscle Exposed: No Joint Exposed: No Bone Exposed: No Treatment Notes Wound #2 (Left Sacrum) Notes Servidio, [place] D. ([id number]) H. Blue and BFD Electronic Signature(s) Signed: 08/30/2019 5:42:19 PM By: Gretta Cool, BSN, RN, CWS, Kim RN, BSN Entered By: Gretta Cool, BSN, RN, CWS, Kim on 08/30/2019 14:16:52 [place], [name] ([id number]) -------------------------------------------------------------------------------- Vitals Details Patient Name: Sarah Reach D. Date of Service: 08/30/2019 1:45 PM Medical Record Number: [id number] Patient Account Number: [id number] Date of Birth/Sex: 06-Nov-1945 (74 y.o. F) Treating RN: Cornell Barman Primary Care Ruxin Ransome: Jules Husbands Other Clinician: Referring Carles Florea: Jules Husbands Treating Taffany Heiser/Extender: Melburn Hake, HOYT Weeks in Treatment: 8 Vital Signs Time Taken: 14:05 Temperature (F): 98.7 Height (in): 59 Pulse (bpm): 75 Weight (lbs): 112 Respiratory Rate (breaths/min): 16 Body Mass Index (BMI): 22.6 Blood Pressure (mmHg): 157/70 Reference Range: 80 - 120 mg / dl Electronic Signature(s) Signed: 08/30/2019 5:42:19 PM By: Gretta Cool, BSN, RN, CWS, Kim RN, BSN Entered By: Gretta Cool, BSN, RN, CWS, Kim on 08/30/2019 14:14:39

## 2019-09-13 ENCOUNTER — Ambulatory Visit: Payer: Medicare Other | Admitting: Physician Assistant

## 2019-09-20 ENCOUNTER — Ambulatory Visit: Payer: Medicare Other | Admitting: Internal Medicine

## 2019-09-27 ENCOUNTER — Other Ambulatory Visit: Payer: Self-pay

## 2019-09-27 ENCOUNTER — Encounter: Payer: Medicare Other | Admitting: Physician Assistant

## 2019-09-27 DIAGNOSIS — E1151 Type 2 diabetes mellitus with diabetic peripheral angiopathy without gangrene: Secondary | ICD-10-CM | POA: Diagnosis not present

## 2019-09-27 NOTE — Progress Notes (Addendum)
Sarah Colon, Sarah Colon ([id number]) Visit Report for 09/27/2019 Arrival Information Details Patient Name: Sarah Colon. Date of Service: 09/27/2019 9:30 AM Medical Record Number: [id number] Patient Account Number: [id number] Date of Birth/Sex: May 26, 1946 (74 y.o. F) Treating RN: Montey Hora Primary Care Megha Agnes: Jules Husbands Other Clinician: Referring Kenzi Bardwell: Jules Husbands Treating Serjio Deupree/Extender: Melburn Hake, HOYT Weeks in Treatment: 12 Visit Information History Since Last Visit Added or deleted any medications: No Patient Arrived: Wheel Chair Any new allergies or adverse reactions: No Arrival Time: 10:05 Had a fall or experienced change in No Accompanied By: self activities of daily living that may affect Transfer Assistance: Harrel Lemon Lift risk of falls: Patient Identification Verified: Yes Signs or symptoms of abuse/neglect since last visito No Secondary Verification Process Completed: Yes Hospitalized since last visit: No Implantable device outside of the clinic excluding No cellular tissue based products placed in the center since last visit: Has Dressing in Place as Prescribed: Yes Pain Present Now: No Electronic Signature(s) Signed: 09/27/2019 4:42:54 PM By: Montey Hora Entered By: Montey Hora on 09/27/2019 10:07:29 Mandella, Sarah Colon ([id number]) -------------------------------------------------------------------------------- Clinic Level of Care Assessment Details Patient Name: Sarah Reach D. Date of Service: 09/27/2019 9:30 AM Medical Record Number: [id number] Patient Account Number: [id number] Date of Birth/Sex: 1945/12/09 (74 y.o. F) Treating RN: Army Melia Primary Care Alivya Wegman: Jules Husbands Other Clinician: Referring Dilia Alemany: Jules Husbands Treating Zanyah Lentsch/Extender: Melburn Hake, HOYT Weeks in Treatment: 12 Clinic Level of Care Assessment Items TOOL 4 Quantity Score []  - Use when only an EandM is performed on FOLLOW-UP visit 0 ASSESSMENTS  - Nursing Assessment / Reassessment X - Reassessment of Co-morbidities (includes updates in patient status) 1 10 X- 1 5 Reassessment of Adherence to Treatment Plan ASSESSMENTS - Wound and Skin Assessment / Reassessment X - Simple Wound Assessment / Reassessment - one wound 1 5 []  - 0 Complex Wound Assessment / Reassessment - multiple wounds []  - 0 Dermatologic / Skin Assessment (not related to wound area) ASSESSMENTS - Focused Assessment []  - Circumferential Edema Measurements - multi extremities 0 []  - 0 Nutritional Assessment / Counseling / Intervention []  - 0 Lower Extremity Assessment (monofilament, tuning fork, pulses) []  - 0 Peripheral Arterial Disease Assessment (using hand held doppler) ASSESSMENTS - Ostomy and/or Continence Assessment and Care []  - Incontinence Assessment and Management 0 []  - 0 Ostomy Care Assessment and Management (repouching, etc.) PROCESS - Coordination of Care X - Simple Patient / Family Education for ongoing care 1 15 []  - 0 Complex (extensive) Patient / Family Education for ongoing care []  - 0 Staff obtains Programmer, systems, Records, Test Results / Process Orders []  - 0 Staff telephones HHA, Nursing Homes / Clarify orders / etc []  - 0 Routine Transfer to another Facility (non-emergent condition) []  - 0 Routine Hospital Admission (non-emergent condition) []  - 0 New Admissions / Biomedical engineer / Ordering NPWT, Apligraf, etc. []  - 0 Emergency Hospital Admission (emergent condition) X- 1 10 Simple Discharge Coordination []  - 0 Complex (extensive) Discharge Coordination PROCESS - Special Needs []  - Pediatric / Minor Patient Management 0 []  - 0 Isolation Patient Management []  - 0 Hearing / Language / Visual special needs []  - 0 Assessment of Community assistance (transportation, D/C planning, etc.) []  - 0 Additional assistance / Altered mentation []  - 0 Support Surface(s) Assessment (bed, cushion, seat, etc.) INTERVENTIONS - Wound  Cleansing / Measurement [name], [name] ([id number]) X- 1 5 Simple Wound Cleansing - one wound []  - 0 Complex Wound Cleansing - multiple wounds X- 1 5 Wound  Imaging (photographs - any number of wounds) []  - 0 Wound Tracing (instead of photographs) X- 1 5 Simple Wound Measurement - one wound []  - 0 Complex Wound Measurement - multiple wounds INTERVENTIONS - Wound Dressings []  - Small Wound Dressing one or multiple wounds 0 X- 1 15 Medium Wound Dressing one or multiple wounds []  - 0 Large Wound Dressing one or multiple wounds []  - 0 Application of Medications - topical []  - 0 Application of Medications - injection INTERVENTIONS - Miscellaneous []  - External ear exam 0 []  - 0 Specimen Collection (cultures, biopsies, blood, body fluids, etc.) []  - 0 Specimen(s) / Culture(s) sent or taken to Lab for analysis []  - 0 Patient Transfer (multiple staff / Civil Service fast streamer / Similar devices) []  - 0 Simple Staple / Suture removal (25 or less) []  - 0 Complex Staple / Suture removal (26 or more) []  - 0 Hypo / Hyperglycemic Management (close monitor of Blood Glucose) []  - 0 Ankle / Brachial Index (ABI) - do not check if billed separately X- 1 5 Vital Signs Has the patient been seen at the hospital within the last three years: Yes Total Score: 80 Level Of Care: New/Established - Level 3 Electronic Signature(s) Signed: 09/27/2019 12:47:05 PM By: Army Melia Entered By: Army Melia on 09/27/2019 10:37:15 [name], Sarah Colon ([id number]) -------------------------------------------------------------------------------- Encounter Discharge Information Details Patient Name: Sarah Reach D. Date of Service: 09/27/2019 9:30 AM Medical Record Number: [id number] Patient Account Number: [id number] Date of Birth/Sex: 12-Mar-1946 (74 y.o. F) Treating RN: Army Melia Primary Care Lenoard Helbert: Jules Husbands Other Clinician: Referring Annajulia Lewing: Jules Husbands Treating Jailee Jaquez/Extender: Melburn Hake, HOYT Weeks in Treatment: 12 Encounter Discharge Information Items Discharge Condition: Stable Ambulatory Status: Wheelchair Discharge Destination: Skilled Nursing Facility Telephoned: No Orders Sent: Yes Transportation: Other Accompanied By: self Schedule Follow-up Appointment: Yes Clinical Summary of Care: Electronic Signature(s) Signed: 09/27/2019 12:47:05 PM By: Army Melia Entered By: Army Melia on 09/27/2019 10:37:56 Schmuck, Zaray Keturah Barre ([id number]) -------------------------------------------------------------------------------- Lower Extremity Assessment Details Patient Name: Sarah Reach D. Date of Service: 09/27/2019 9:30 AM Medical Record Number: [id number] Patient Account Number: [id number] Date of Birth/Sex: 06/21/1945 (74 y.o. F) Treating RN: Montey Hora Primary Care Saksham Akkerman: Jules Husbands Other Clinician: Referring Jamarl Pew: Jules Husbands Treating Jenilyn Magana/Extender: Melburn Hake, HOYT Weeks in Treatment: 12 Electronic Signature(s) Signed: 09/27/2019 4:42:54 PM By: Montey Hora Entered By: Montey Hora on 09/27/2019 10:07:43 Castrejon, Markie DMarland Kitchen ([id number]) -------------------------------------------------------------------------------- Multi Wound Chart Details Patient Name: Sarah Reach D. Date of Service: 09/27/2019 9:30 AM Medical Record Number: [id number] Patient Account Number: [id number] Date of Birth/Sex: 08-11-1945 (74 y.o. F) Treating RN: Army Melia Primary Care Illiana Losurdo: Jules Husbands Other Clinician: Referring Kylil Swopes: Jules Husbands Treating Kymberly Blomberg/Extender: Melburn Hake, HOYT Weeks in Treatment: 12 Vital Signs Height(in): 59 Pulse(bpm): 70 Weight(lbs): 112 Blood Pressure(mmHg): 173/61 Body Mass Index(BMI): 23 Temperature(F): 98.7 Respiratory Rate(breaths/min): 16 Photos: [N/A:N/A] Wound Location: Left Sacrum N/A N/A Wounding Event: Pressure Injury N/A N/A Primary Etiology: Pressure Ulcer N/A N/A Comorbid History:  Cataracts, Glaucoma, Arrhythmia, N/A N/A Congestive Heart Failure, Hypertension, Type II Diabetes, End Stage Renal Disease, History of pressure wounds Date Acquired: 07/01/2018 N/A N/A Weeks of Treatment: 12 N/A N/A Wound Status: Open N/A N/A Measurements L x W x D (cm) 1x1.4x0.6 N/A N/A Area (cm) : 1.1 N/A N/A Volume (cm) : 0.66 N/A N/A % Reduction in Area: 41.60% N/A N/A % Reduction in Volume: 75.00% N/A N/A Starting Position 1 (o'clock): 1 Ending Position 1 (o'clock): 1 Maximum Distance 1 (cm): 0.7 Undermining: Yes N/A N/A  Classification: Category/Stage III N/A N/A Exudate Amount: Medium N/A N/A Exudate Type: Serous N/A N/A Exudate Color: amber N/A N/A Wound Margin: Epibole N/A N/A Granulation Amount: Large (67-100%) N/A N/A Necrotic Amount: None Present (0%) N/A N/A Exposed Structures: Fat Layer (Subcutaneous Tissue) N/A N/A Exposed: Yes Fascia: No Tendon: No Muscle: No Joint: No Bone: No Epithelialization: Large (67-100%) N/A N/A Treatment Notes Electronic Signature(s) Signed: 09/27/2019 12:47:05 PM By: Higinio Plan, Gloria D. ([id number]) Entered By: Army Melia on 09/27/2019 10:36:41 Cheatum, Sarah Colon ([id number]) -------------------------------------------------------------------------------- [place] Details Patient Name: Sarah Reach D. Date of Service: 09/27/2019 9:30 AM Medical Record Number: [id number] Patient Account Number: [id number] Date of Birth/Sex: July 11, 1945 (74 y.o. F) Treating RN: Army Melia Primary Care Nichols Corter: Jules Husbands Other Clinician: Referring Darby Fleeman: Jules Husbands Treating Emilyann Banka/Extender: Melburn Hake, HOYT Weeks in Treatment: 12 Active Inactive Abuse / Safety / Falls / Self Care Management Nursing Diagnoses: Potential for injury related to transfers Goals: Patient/caregiver will verbalize/demonstrate measures taken to improve the patient's personal safety Date Initiated: 07/05/2019 Target  Resolution Date: 08/02/2019 Goal Status: Active Interventions: Provide education on fall prevention Notes: Pressure Nursing Diagnoses: Knowledge deficit related to management of pressures ulcers Goals: Patient/caregiver will verbalize risk factors for pressure ulcer development Date Initiated: 07/05/2019 Target Resolution Date: 08/02/2019 Goal Status: Active Interventions: Assess offloading mechanisms upon admission and as needed Notes: Wound/Skin Impairment Nursing Diagnoses: Impaired tissue integrity Goals: Ulcer/skin breakdown will have a volume reduction of 30% by week 4 Date Initiated: 07/05/2019 Target Resolution Date: 08/02/2019 Goal Status: Active Interventions: Assess ulceration(s) every visit Notes: Electronic Signature(s) Signed: 09/27/2019 12:47:05 PM By: Army Melia Entered By: Army Melia on 09/27/2019 10:36:33 Mcmath, Kysa D. ([id number]) -------------------------------------------------------------------------------- Pain Assessment Details Patient Name: Sarah Reach D. Date of Service: 09/27/2019 9:30 AM Medical Record Number: [id number] Patient Account Number: [id number] Date of Birth/Sex: 1946/05/27 (74 y.o. F) Treating RN: Montey Hora Primary Care Teaghan Formica: Jules Husbands Other Clinician: Referring Anuja Manka: Jules Husbands Treating Onna Nodal/Extender: Melburn Hake, HOYT Weeks in Treatment: 12 Active Problems Location of Pain Severity and Description of Pain Patient Has Paino No Site Locations Pain Management and Medication Current Pain Management: Electronic Signature(s) Signed: 09/27/2019 4:42:54 PM By: Montey Hora Entered By: Montey Hora on 09/27/2019 10:07:37 [place], Sarah Colon ([id number]) -------------------------------------------------------------------------------- Patient/Caregiver Education Details Patient Name: Sarah Reach D. Date of Service: 09/27/2019 9:30 AM Medical Record Number: [id number] Patient Account Number:  [id number] Date of Birth/Gender: [birth year] (74 y.o. F) Treating RN: Army Melia Primary Care Physician: Jules Husbands Other Clinician: Referring Physician: Jules Husbands Treating Physician/Extender: Sharalyn Ink in Treatment: 12 Education Assessment Education Provided To: Patient Education Topics Provided Wound/Skin Impairment: Handouts: Caring for Your Ulcer Methods: Demonstration, Explain/Verbal Responses: State content correctly Electronic Signature(s) Signed: 09/27/2019 12:47:05 PM By: Army Melia Entered By: Army Melia on 09/27/2019 10:37:31 Biel, Adalay D. ([id number]) -------------------------------------------------------------------------------- Wound Assessment Details Patient Name: Sarah Reach D. Date of Service: 09/27/2019 9:30 AM Medical Record Number: [id number] Patient Account Number: [id number] Date of Birth/Sex: 02/02/1946 (74 y.o. F) Treating RN: Montey Hora Primary Care Ulah Olmo: Jules Husbands Other Clinician: Referring Liset Mcmonigle: Jules Husbands Treating Inocencia Murtaugh/Extender: Melburn Hake, HOYT Weeks in Treatment: 12 Wound Status Wound Number: 2 Primary Pressure Ulcer Etiology: Wound Location: Left Sacrum Wound Open Wounding Event: Pressure Injury Status: Date Acquired: 07/01/2018 Comorbid Cataracts, Glaucoma, Arrhythmia, Congestive Heart Weeks Of Treatment: 12 History: Failure, Hypertension, Type II Diabetes, End Stage Renal Clustered Wound: No Disease, History of pressure wounds Photos Wound Measurements Length: (cm) 1 % R Width: (cm) 1.4 %  R Depth: (cm) 0.6 Epi Area: (cm) 1.1 Tu Volume: (cm) 0.66 Un eduction in Area: 41.6% eduction in Volume: 75% thelialization: Large (67-100%) nneling: No dermining: Yes Starting Position (o'clock): 1 Ending Position (o'clock): 1 Maximum Distance: (cm) 0.7 Wound Description Classification: Category/Stage III Fou Wound Margin: Epibole Slo Exudate Amount: Medium Exudate Type:  Serous Exudate Color: amber l Odor After Cleansing: No ugh/Fibrino Yes Wound Bed Granulation Amount: Large (67-100%) Exposed Structure Necrotic Amount: None Present (0%) Fascia Exposed: No Fat Layer (Subcutaneous Tissue) Exposed: Yes Tendon Exposed: No Muscle Exposed: No Joint Exposed: No Bone Exposed: No Treatment Notes Wound #2 (Left Sacrum) Haber, Gilma D. ([id number]) Notes H. Blue and BFD Electronic Signature(s) Signed: 09/27/2019 4:42:54 PM By: Montey Hora Entered By: Montey Hora on 09/27/2019 10:19:51 Crisostomo, Sarah Colon ([id number]) -------------------------------------------------------------------------------- Vitals Details Patient Name: Sarah Reach D. Date of Service: 09/27/2019 9:30 AM Medical Record Number: [id number] Patient Account Number: [id number] Date of Birth/Sex: [birth year] (74 y.o. F) Treating RN: Montey Hora Primary Care Leiyah Maultsby: Jules Husbands Other Clinician: Referring Monya Kozakiewicz: Jules Husbands Treating Katrece Roediger/Extender: Melburn Hake, HOYT Weeks in Treatment: 12 Vital Signs Time Taken: 10:07 Temperature (F): 98.7 Height (in): 59 Pulse (bpm): 70 Weight (lbs): 112 Respiratory Rate (breaths/min): 16 Body Mass Index (BMI): 22.6 Blood Pressure (mmHg): 173/61 Reference Range: 80 - 120 mg / dl Electronic Signature(s) Signed: 09/27/2019 4:42:54 PM By: Montey Hora Entered By: Montey Hora on 09/27/2019 10:08:15

## 2019-09-27 NOTE — Progress Notes (Addendum)
EILIANA, DRONE ([id number]) Visit Report for 09/27/2019 Chief Complaint Document Details Patient Name: Sarah Colon, Sarah Colon. Date of Service: 09/27/2019 9:30 AM Medical Record Number: [id number] Patient Account Number: [id number] Date of Birth/Sex: 1946-01-24 (74 y.o. F) Treating RN: Army Melia Primary Care Provider: Jules Husbands Other Clinician: Referring Provider: Jules Husbands Treating Provider/Extender: Melburn Hake, Maxden Naji Weeks in Treatment: 12 Information Obtained from: Patient Chief Complaint Sacral Pressure ulcer on the left Electronic Signature(s) Signed: 09/27/2019 9:33:40 AM By: Worthy Keeler PA-C Entered By: Worthy Keeler on 09/27/2019 09:33:40 Sarah Colon, Sarah Colon ([id number]) -------------------------------------------------------------------------------- HPI Details Patient Name: Sarah Reach D. Date of Service: 09/27/2019 9:30 AM Medical Record Number: [id number] Patient Account Number: [id number] Date of Birth/Sex: [birth year] (74 y.o. 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 She states that this initially occurred about a year ago closed and then reopened several weeks ago though she does not have a specific timeframe for this. Unfortunately she states there is some discomfort at this time. She does have a history of atrial fibrillation, congestive heart failure, hypertension, diabetes mellitus type II, and her most recent hemoglobin A1c was 6.8 on 05/30/18. She did have a CT scan of the sacral region on 06/19/18 this was negative for osteomyelitis which is excellent news. Patient was on doxycycline two weeks ago and it appears based on record review that she also  is currently on Cipro. There is some question about whether not she has a urinary tract infection. She did have a CBC performed which showed a white blood cell count at 20 which again indicates some kind of infection in my pinion. Again upon inspection today I seriously doubt that this is an infection secondary to the wound. I seen no signs of infection here. No fevers, chills, nausea, or vomiting noted at this time. The patient in fact states she feels really well. This again I feel like leads credence to the fact that she may have a urinary tract infection. 3/3/20Patient's wound bed currently shows evidence of great granulation at this time and does not appear to be signs of infection currently which is excellent news. It's actually fairly encouraging the wound is not doing any worse at this point. I am pleased with he address it has made. Readmission: 07/05/2019 on evaluation today patient presents for readmission she previously has been seen here in the clinic she is a referral from [facility] and rehabilitation center in [place]. Fortunately there is no signs of the patient having any severe infection based on what we are seeing today which is great news she has not had osteomyelitis even in the past when we have seen her. With that being said she does tell me that she unfortunately spends the majority of her time in bed sitting up which I think is putting pressure on the region which is why this does get smaller than reopens and she is really not able to get this to heal and close appropriately. Fortunately she is not having any significant pain. She tells me she does not really go outside of her room due to Covid there are pretty much isolated in the room she has been very bored over the past year. 07/12/2019 on evaluation today patient appears to be doing much  better in regard to her sacral wound at this point. She does have another small ulcer on her back in the lower portion of her  back that is present today as well although that seems to be very superficial. In general she has tolerated the air mattress although it is somewhat cold to her they have not put a blanket between the air mattress in her body I think that would make a big difference. I will write an order for that today. 07/26/2019 upon evaluation today patient appears to be doing well with regard to her wound. I do feel like we are seeing signs of improvement here it is measuring smaller and overall I am pleased. The wound on her back is doing much better as the sacral wound that is still open. Fortunately there is no signs of active infection at this time. 08/09/2019 upon evaluation today patient appears to be doing decently well with regard to her wound. Fortunately she seems to be showing some signs of improvement which is good news. There is no evidence of active infection at this time. No fevers, chills, nausea, vomiting, or diarrhea. Patient's wound bed currently showed signs of good granulation at this point overall I am extremely pleased with how things seem to be progressing. The patient likewise is happy to hear this. 08/30/2019 upon evaluation today patient appears to be doing well with regard to her wound. This is actually measuring somewhat better. I feel like each time we see her she is doing a little better each time. Overall I am very pleased with the progress she is made. 09/27/2019 upon evaluation today patient appears to be doing well with regard to her sacral wound. She still does have some epiboly noted but still the wound seems to be showing signs of doing better in my opinion. I am pleased in that regard. There is no evidence of active infection at this time. No fevers, chills, nausea, vomiting, or diarrhea. Electronic Signature(s) Signed: 09/27/2019 10:39:35 AM By: Worthy Keeler PA-C Entered By: Worthy Keeler on 09/27/2019 10:39:34 Sarah Colon, Sarah Colon  ([id number]) -------------------------------------------------------------------------------- Physical Exam Details Patient Name: Sarah Reach D. Date of Service: 09/27/2019 9:30 AM Medical Record Number: [id number] Patient Account Number: [id number] Date of Birth/Sex: 1946/03/08 (74 y.o. F) Treating RN: Army Melia Primary Care Provider: Jules Husbands Other Clinician: Referring Provider: Jules Husbands Treating Provider/Extender: Melburn Hake, Lyncoln Ledgerwood Weeks in Treatment: 17 Constitutional Well-nourished and well-hydrated in no acute distress. Respiratory normal breathing without difficulty. Psychiatric this patient is able to make decisions and demonstrates good insight into disease process. Alert and Oriented x 3. pleasant and cooperative. Notes Upon inspection patient's wound bed actually showed signs of excellent granulation at this time that does not appear to be any evidence of active infection which is great news. No fevers, chills, nausea, vomiting, or diarrhea. I do believe the [facility] is doing a good job for him and recommend we continue as such. Electronic Signature(s) Signed: 09/27/2019 10:39:56 AM By: Worthy Keeler PA-C Entered By: Worthy Keeler on 09/27/2019 10:39:55 Sarah Colon, Sarah Colon ([id number]) -------------------------------------------------------------------------------- Physician Orders Details Patient Name: Sarah Reach D. Date of Service: 09/27/2019 9:30 AM Medical Record Number: [id number] Patient Account Number: [id number] Date of Birth/Sex: 08-Jul-1945 (74 y.o. F) Treating RN: Montey Hora Primary Care Provider: Jules Husbands Other Clinician: Referring Provider: Jules Husbands Treating Provider/Extender: Melburn Hake, Breyer Tejera Weeks in Treatment: 66 Verbal / Phone Orders: No Diagnosis Coding ICD-10 Coding Code Description L89.153 Pressure ulcer of sacral region, stage  3 I10 Essential (primary) hypertension E11.622 Type 2 diabetes mellitus with  other skin ulcer I48.20 Chronic atrial fibrillation, unspecified I50.42 Chronic combined systolic (congestive) and diastolic (congestive) heart failure Wound Cleansing Wound #2 Left Sacrum o Clean wound with Normal Saline. - in clinic o May Shower, gently pat wound dry prior to applying new dressing. Skin Barriers/Peri-Wound Care Wound #2 Left Sacrum o Skin Prep Primary Wound Dressing Wound #2 Left Sacrum o Hydrafera Blue Ready Transfer - packed into wound 12-12 8-12o'clock is the deeper undermining that needs to be filled with a single piece hydrafera blue Secondary Dressing Wound #2 Left Sacrum o Boardered Foam Dressing Dressing Change Frequency Wound #2 Left Sacrum o Change dressing every day. o Other: - as needed for incontinence Follow-up Appointments Wound #2 Left Sacrum o Return Appointment in 2 weeks. Off-Loading Wound #2 Left Sacrum o Mattress - Pt needs air mattress o Turn and reposition every 2 hours Electronic Signature(s) Signed: 09/27/2019 4:42:54 PM By: Montey Hora Signed: 09/27/2019 5:56:59 PM By: Worthy Keeler PA-C Entered By: Montey Hora on 09/27/2019 10:30:00 Sarah Colon, Sarah Colon ([id number]) -------------------------------------------------------------------------------- Problem List Details Patient Name: Sarah Reach D. Date of Service: 09/27/2019 9:30 AM Medical Record Number: [id number] Patient Account Number: [id number] Date of Birth/Sex: 1945-12-07 (74 y.o. F) Treating RN: Army Melia Primary Care Provider: Jules Husbands Other Clinician: Referring Provider: Jules Husbands Treating Provider/Extender: Melburn Hake, Devlyn Parish Weeks in Treatment: 12 Active Problems ICD-10 Encounter Code Description Active Date MDM Diagnosis L89.153 Pressure ulcer of sacral region, stage 3 07/05/2019 No Yes I10 Essential (primary) hypertension 07/05/2019 No Yes E11.622 Type 2 diabetes mellitus with other skin ulcer 07/05/2019 No Yes I48.20 Chronic  atrial fibrillation, unspecified 07/05/2019 No Yes I50.42 Chronic combined systolic (congestive) and diastolic (congestive) heart 07/05/2019 No Yes failure Inactive Problems Resolved Problems Electronic Signature(s) Signed: 09/27/2019 9:33:35 AM By: Worthy Keeler PA-C Entered By: Worthy Keeler on 09/27/2019 09:33:34 Sarah Colon, [name] ([id number]) -------------------------------------------------------------------------------- Progress Note Details Patient Name: Sarah Reach D. Date of Service: 09/27/2019 9:30 AM Medical Record Number: [id number] Patient Account Number: [id number] Date of Birth/Sex: January 26, 1946 (74 y.o. 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 She states that this initially occurred about a year ago closed and then reopened several weeks ago though she does not have a specific timeframe for this. Unfortunately she states there is some discomfort at this time. She does have a history of atrial fibrillation, congestive heart failure, hypertension, diabetes mellitus type II, and her most recent hemoglobin A1c was 6.8 on 05/30/18. She did have a CT scan of the sacral region on 06/19/18 this was negative for osteomyelitis which is excellent news. Patient was on doxycycline two weeks ago and it appears based on record review that she also is currently on Cipro. There is some question about whether not she has a urinary tract infection. She did have a CBC performed which showed a white blood cell count at 20 which again indicates some kind of infection in my pinion. Again upon inspection today I  seriously doubt that this is an infection secondary to the wound. I seen no signs of infection here. No fevers, chills, nausea, or vomiting noted at this  time. The patient in fact states she feels really well. This again I feel like leads credence to the fact that she may have a urinary tract infection. 3/3/20Patient's wound bed currently shows evidence of great granulation at this time and does not appear to be signs of infection currently which is excellent news. It's actually fairly encouraging the wound is not doing any worse at this point. I am pleased with he address it has made. Readmission: 07/05/2019 on evaluation today patient presents for readmission she previously has been seen here in the clinic she is a referral from [facility] and rehabilitation center in [place]. Fortunately there is no signs of the patient having any severe infection based on what we are seeing today which is great news she has not had osteomyelitis even in the past when we have seen her. With that being said she does tell me that she unfortunately spends the majority of her time in bed sitting up which I think is putting pressure on the region which is why this does get smaller than reopens and she is really not able to get this to heal and close appropriately. Fortunately she is not having any significant pain. She tells me she does not really go outside of her room due to Covid there are pretty much isolated in the room she has been very bored over the past year. 07/12/2019 on evaluation today patient appears to be doing much better in regard to her sacral wound at this point. She does have another small ulcer on her back in the lower portion of her back that is present today as well although that seems to be very superficial. In general she has tolerated the air mattress although it is somewhat cold to her they have not put a blanket between the air mattress in her body I think that would make a big  difference. I will write an order for that today. 07/26/2019 upon evaluation today patient appears to be doing well with regard to her wound. I do feel like we are seeing signs of improvement here it is measuring smaller and overall I am pleased. The wound on her back is doing much better as the sacral wound that is still open. Fortunately there is no signs of active infection at this time. 08/09/2019 upon evaluation today patient appears to be doing decently well with regard to her wound. Fortunately she seems to be showing some signs of improvement which is good news. There is no evidence of active infection at this time. No fevers, chills, nausea, vomiting, or diarrhea. Patient's wound bed currently showed signs of good granulation at this point overall I am extremely pleased with how things seem to be progressing. The patient likewise is happy to hear this. 08/30/2019 upon evaluation today patient appears to be doing well with regard to her wound. This is actually measuring somewhat better. I feel like each time we see her she is doing a little better each time. Overall I am very pleased with the progress she is made. 09/27/2019 upon evaluation today patient appears to be doing well with regard to her sacral wound. She still does have some epiboly noted but still the wound seems to be showing signs of doing better in my opinion. I am pleased in that regard. There is no evidence of active infection at this time. No fevers, chills, nausea, vomiting, or diarrhea. Objective Constitutional Well-nourished and well-hydrated in no acute distress. Vitals Time Taken: 10:07 AM, Height: 59 in,  Weight: 112 lbs, BMI: 22.6, Temperature: 98.7 F, Pulse: 70 bpm, Respiratory Rate: 16 breaths/min, Sarah Colon, [name] ([id number]) Blood Pressure: 173/61 mmHg. Respiratory normal breathing without difficulty. Psychiatric this patient is able to make decisions and demonstrates good insight into disease process. Alert  and Oriented x 3. pleasant and cooperative. General Notes: Upon inspection patient's wound bed actually showed signs of excellent granulation at this time that does not appear to be any evidence of active infection which is great news. No fevers, chills, nausea, vomiting, or diarrhea. I do believe the [facility] is doing a good job for him and recommend we continue as such. Integumentary (Hair, Skin) Wound #2 status is Open. Original cause of wound was Pressure Injury. The wound is located on the Left Sacrum. The wound measures 1cm length x 1.4cm width x 0.6cm depth; 1.1cm^2 area and 0.66cm^3 volume. There is Fat Layer (Subcutaneous Tissue) Exposed exposed. There is no tunneling noted, however, there is undermining starting at 1:00 and ending at 1:00 with a maximum distance of 0.7cm. There is a medium amount of serous drainage noted. The wound margin is epibole. There is large (67-100%) granulation within the wound bed. There is no necrotic tissue within the wound bed. Assessment Active Problems ICD-10 Pressure ulcer of sacral region, stage 3 Essential (primary) hypertension Type 2 diabetes mellitus with other skin ulcer Chronic atrial fibrillation, unspecified Chronic combined systolic (congestive) and diastolic (congestive) heart failure Plan Wound Cleansing: Wound #2 Left Sacrum: Clean wound with Normal Saline. - in clinic May Shower, gently pat wound dry prior to applying new dressing. Skin Barriers/Peri-Wound Care: Wound #2 Left Sacrum: Skin Prep Primary Wound Dressing: Wound #2 Left Sacrum: Hydrafera Blue Ready Transfer - packed into wound 12-12 8-12o'clock is the deeper undermining that needs to be filled with a single piece hydrafera blue Secondary Dressing: Wound #2 Left Sacrum: Boardered Foam Dressing Dressing Change Frequency: Wound #2 Left Sacrum: Change dressing every day. Other: - as needed for incontinence Follow-up Appointments: Wound #2 Left Sacrum: Return  Appointment in 2 weeks. Off-Loading: Wound #2 Left Sacrum: Mattress - Pt needs air mattress Turn and reposition every 2 hours 1. My suggestion at this time is can be that we go ahead and continue with the [facility] dressing I still think this is the best way to go currently. 2. I am also can recommend we continue with border foam dressing to cover that is still a good option for her. 3. I would recommend continued and appropriate offloading including repositioning every 2 hours I think that still necessary. Sarah Colon, Sarah Colon ([id number]) We will see patient back for reevaluation in 2 weeks here in the clinic. If anything worsens or changes patient will contact our office for additional recommendations. Electronic Signature(s) Signed: 09/27/2019 10:40:28 AM By: Worthy Keeler PA-C Entered By: Worthy Keeler on 09/27/2019 10:40:27 Sarah Colon, Sarah Colon Kitchen ([id number]) -------------------------------------------------------------------------------- SuperBill Details Patient Name: Sarah Reach D. Date of Service: 09/27/2019 Medical Record Number: [id number] Patient Account Number: [id number] Date of Birth/Sex: 12-22-1945 (74 y.o. F) Treating RN: Army Melia Primary Care Provider: Jules Husbands Other Clinician: Referring Provider: Jules Husbands Treating Provider/Extender: Melburn Hake, Dannell Gortney Weeks in Treatment: 12 Diagnosis Coding ICD-10 Codes Code Description L89.153 Pressure ulcer of sacral region, stage 3 I10 Essential (primary) hypertension E11.622 Type 2 diabetes mellitus with other skin ulcer I48.20 Chronic atrial fibrillation, unspecified I50.42 Chronic combined systolic (congestive) and diastolic (congestive) heart failure Facility Procedures CPT4 Code: 63016010 Description: 99213 - WOUND CARE VISIT-LEV 3 EST PT Modifier:  Quantity: 1 Physician Procedures CPT4 Code: 0447158 Description: 06386 - WC PHYS LEVEL 3 - EST PT Modifier: Quantity: 1 CPT4 Code: Description:  ICD-10 Diagnosis Description L89.153 Pressure ulcer of sacral region, stage 3 I10 Essential (primary) hypertension E11.622 Type 2 diabetes mellitus with other skin ulcer I48.20 Chronic atrial fibrillation, unspecified Modifier: Quantity: Electronic Signature(s) Signed: 09/27/2019 10:40:50 AM By: Worthy Keeler PA-C Entered By: Worthy Keeler on 09/27/2019 10:40:49

## 2019-10-05 ENCOUNTER — Telehealth: Payer: Self-pay | Admitting: Hematology

## 2019-10-05 NOTE — Telephone Encounter (Signed)
[place] per 5/7 sch message to set up f/u appt . Transferred to RN station and no answer.

## 2019-10-11 ENCOUNTER — Ambulatory Visit: Payer: Medicare Other | Admitting: Internal Medicine

## 2019-10-12 ENCOUNTER — Telehealth: Payer: Self-pay | Admitting: *Deleted

## 2019-10-12 NOTE — Telephone Encounter (Signed)
[facility] sent a referral elevated wbc count and requested appt -- patient is resident at [place].  Per Dr.Kale: Schedule non urgent f/u in 3-4 weeks as a double booked patient at the end of the day.  Contacted [facility] [phone], connected with Marita Kansas (patient nurse) after multiple phone attempts. Gave information regarding upcoming appt - May 26 at 3:20pm. Marita Kansas verbalized understanding.

## 2019-10-24 ENCOUNTER — Inpatient Hospital Stay: Payer: Medicare Other | Attending: Hematology | Admitting: Hematology

## 2019-10-24 ENCOUNTER — Other Ambulatory Visit: Payer: Self-pay | Admitting: Hematology

## 2019-10-24 ENCOUNTER — Inpatient Hospital Stay: Payer: Medicare Other

## 2019-10-24 ENCOUNTER — Other Ambulatory Visit: Payer: Self-pay

## 2019-10-24 VITALS — BP 134/66 | HR 60 | Temp 97.8°F | Resp 18 | Ht 60.0 in

## 2019-10-24 DIAGNOSIS — D72829 Elevated white blood cell count, unspecified: Secondary | ICD-10-CM

## 2019-10-24 DIAGNOSIS — D469 Myelodysplastic syndrome, unspecified: Secondary | ICD-10-CM | POA: Diagnosis not present

## 2019-10-24 LAB — CMP (CANCER CENTER ONLY)
ALT: 25 U/L (ref 0–44)
AST: 37 U/L (ref 15–41)
Albumin: 3 g/dL — ABNORMAL LOW (ref 3.5–5.0)
Alkaline Phosphatase: 246 U/L — ABNORMAL HIGH (ref 38–126)
Anion gap: 7 (ref 5–15)
BUN: 33 mg/dL — ABNORMAL HIGH (ref 8–23)
CO2: 26 mmol/L (ref 22–32)
Calcium: 9.2 mg/dL (ref 8.9–10.3)
Chloride: 109 mmol/L (ref 98–111)
Creatinine: 2.19 mg/dL — ABNORMAL HIGH (ref 0.44–1.00)
GFR, Est AFR Am: 25 mL/min — ABNORMAL LOW (ref >60)
GFR, Estimated: 21 mL/min — ABNORMAL LOW (ref >60)
Glucose, Bld: 123 mg/dL — ABNORMAL HIGH (ref 70–99)
Potassium: 4.7 mmol/L (ref 3.5–5.1)
Sodium: 142 mmol/L (ref 135–145)
Total Bilirubin: 0.3 mg/dL (ref 0.3–1.2)
Total Protein: 7.7 g/dL (ref 6.5–8.1)

## 2019-10-24 LAB — CBC WITH DIFFERENTIAL/PLATELET
Abs Immature Granulocytes: 0.17 10*3/uL — ABNORMAL HIGH (ref 0.00–0.07)
Basophils Absolute: 0.1 10*3/uL (ref 0.0–0.1)
Basophils Relative: 1 %
Eosinophils Absolute: 0.8 10*3/uL — ABNORMAL HIGH (ref 0.0–0.5)
Eosinophils Relative: 5 %
HCT: 36 % (ref 36.0–46.0)
Hemoglobin: 11.9 g/dL — ABNORMAL LOW (ref 12.0–15.0)
Immature Granulocytes: 1 %
Lymphocytes Relative: 21 %
Lymphs Abs: 3.5 10*3/uL (ref 0.7–4.0)
MCH: 28.5 pg (ref 26.0–34.0)
MCHC: 33.1 g/dL (ref 30.0–36.0)
MCV: 86.3 fL (ref 80.0–100.0)
Monocytes Absolute: 1 10*3/uL (ref 0.1–1.0)
Monocytes Relative: 6 %
Neutro Abs: 11 10*3/uL — ABNORMAL HIGH (ref 1.7–7.7)
Neutrophils Relative %: 66 %
Platelets: 445 10*3/uL — ABNORMAL HIGH (ref 150–400)
RBC: 4.17 MIL/uL (ref 3.87–5.11)
RDW: 16.4 % — ABNORMAL HIGH (ref 11.5–15.5)
WBC: 16.5 10*3/uL — ABNORMAL HIGH (ref 4.0–10.5)
nRBC: 0 % (ref 0.0–0.2)

## 2019-10-24 LAB — LACTATE DEHYDROGENASE: LDH: 158 U/L (ref 98–192)

## 2019-10-24 NOTE — Progress Notes (Unsigned)
Cbc
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
## 2019-11-14 ENCOUNTER — Telehealth: Payer: Self-pay | Admitting: *Deleted

## 2019-11-14 NOTE — Telephone Encounter (Signed)
Received VMM from Ardith Dark with [facility] - their MD wants patient to be seen for increased WBC. Patient last appt with Dr. Irene Limbo 10/24/19. His plan: RTC with Dr Irene Limbo with labs in 12 months. Patient currently has appt 10/24/20.  Attempted to contact Ms. Cunningham x 2 - each time call transferred and no answer.

## 2019-12-08 ENCOUNTER — Encounter (HOSPITAL_COMMUNITY): Payer: Self-pay

## 2019-12-08 ENCOUNTER — Inpatient Hospital Stay (HOSPITAL_COMMUNITY)
Admission: EM | Admit: 2019-12-08 | Discharge: 2019-12-13 | DRG: 689 | Disposition: A | Discharge status: Home Health | Payer: Medicare Other | Attending: Internal Medicine | Admitting: Internal Medicine

## 2019-12-08 ENCOUNTER — Other Ambulatory Visit: Payer: Self-pay

## 2019-12-08 DIAGNOSIS — Z8049 Family history of malignant neoplasm of other genital organs: Secondary | ICD-10-CM

## 2019-12-08 DIAGNOSIS — M109 Gout, unspecified: Secondary | ICD-10-CM | POA: Diagnosis present

## 2019-12-08 DIAGNOSIS — M6282 Rhabdomyolysis: Secondary | ICD-10-CM | POA: Diagnosis present

## 2019-12-08 DIAGNOSIS — D631 Anemia in chronic kidney disease: Secondary | ICD-10-CM | POA: Diagnosis present

## 2019-12-08 DIAGNOSIS — L89154 Pressure ulcer of sacral region, stage 4: Secondary | ICD-10-CM | POA: Diagnosis present

## 2019-12-08 DIAGNOSIS — H409 Unspecified glaucoma: Secondary | ICD-10-CM | POA: Diagnosis present

## 2019-12-08 DIAGNOSIS — M545 Low back pain: Secondary | ICD-10-CM | POA: Diagnosis present

## 2019-12-08 DIAGNOSIS — Z885 Allergy status to narcotic agent status: Secondary | ICD-10-CM

## 2019-12-08 DIAGNOSIS — Z79890 Hormone replacement therapy (postmenopausal): Secondary | ICD-10-CM

## 2019-12-08 DIAGNOSIS — M25562 Pain in left knee: Secondary | ICD-10-CM | POA: Diagnosis present

## 2019-12-08 DIAGNOSIS — R531 Weakness: Secondary | ICD-10-CM | POA: Diagnosis present

## 2019-12-08 DIAGNOSIS — N10 Acute tubulo-interstitial nephritis: Principal | ICD-10-CM | POA: Diagnosis present

## 2019-12-08 DIAGNOSIS — Z841 Family history of disorders of kidney and ureter: Secondary | ICD-10-CM

## 2019-12-08 DIAGNOSIS — M1990 Unspecified osteoarthritis, unspecified site: Secondary | ICD-10-CM | POA: Diagnosis present

## 2019-12-08 DIAGNOSIS — Z87891 Personal history of nicotine dependence: Secondary | ICD-10-CM

## 2019-12-08 DIAGNOSIS — N3000 Acute cystitis without hematuria: Secondary | ICD-10-CM | POA: Diagnosis present

## 2019-12-08 DIAGNOSIS — N390 Urinary tract infection, site not specified: Secondary | ICD-10-CM | POA: Diagnosis present

## 2019-12-08 DIAGNOSIS — Z794 Long term (current) use of insulin: Secondary | ICD-10-CM

## 2019-12-08 DIAGNOSIS — Z66 Do not resuscitate: Secondary | ICD-10-CM | POA: Diagnosis present

## 2019-12-08 DIAGNOSIS — I480 Paroxysmal atrial fibrillation: Secondary | ICD-10-CM | POA: Diagnosis present

## 2019-12-08 DIAGNOSIS — N179 Acute kidney failure, unspecified: Secondary | ICD-10-CM | POA: Diagnosis present

## 2019-12-08 DIAGNOSIS — N12 Tubulo-interstitial nephritis, not specified as acute or chronic: Secondary | ICD-10-CM | POA: Diagnosis present

## 2019-12-08 DIAGNOSIS — I152 Hypertension secondary to endocrine disorders: Secondary | ICD-10-CM | POA: Diagnosis present

## 2019-12-08 DIAGNOSIS — Z20822 Contact with and (suspected) exposure to covid-19: Secondary | ICD-10-CM | POA: Diagnosis present

## 2019-12-08 DIAGNOSIS — Z79899 Other long term (current) drug therapy: Secondary | ICD-10-CM

## 2019-12-08 DIAGNOSIS — Z7901 Long term (current) use of anticoagulants: Secondary | ICD-10-CM

## 2019-12-08 DIAGNOSIS — R269 Unspecified abnormalities of gait and mobility: Secondary | ICD-10-CM | POA: Diagnosis present

## 2019-12-08 DIAGNOSIS — E1122 Type 2 diabetes mellitus with diabetic chronic kidney disease: Secondary | ICD-10-CM | POA: Diagnosis present

## 2019-12-08 DIAGNOSIS — M25561 Pain in right knee: Secondary | ICD-10-CM | POA: Diagnosis present

## 2019-12-08 DIAGNOSIS — I5032 Chronic diastolic (congestive) heart failure: Secondary | ICD-10-CM | POA: Diagnosis present

## 2019-12-08 DIAGNOSIS — I130 Hypertensive heart and chronic kidney disease with heart failure and stage 1 through stage 4 chronic kidney disease, or unspecified chronic kidney disease: Secondary | ICD-10-CM | POA: Diagnosis present

## 2019-12-08 DIAGNOSIS — E039 Hypothyroidism, unspecified: Secondary | ICD-10-CM | POA: Diagnosis present

## 2019-12-08 DIAGNOSIS — E11610 Type 2 diabetes mellitus with diabetic neuropathic arthropathy: Secondary | ICD-10-CM | POA: Diagnosis present

## 2019-12-08 DIAGNOSIS — E1142 Type 2 diabetes mellitus with diabetic polyneuropathy: Secondary | ICD-10-CM | POA: Diagnosis present

## 2019-12-08 DIAGNOSIS — E1169 Type 2 diabetes mellitus with other specified complication: Secondary | ICD-10-CM | POA: Diagnosis present

## 2019-12-08 DIAGNOSIS — G8929 Other chronic pain: Secondary | ICD-10-CM | POA: Diagnosis present

## 2019-12-08 DIAGNOSIS — E1159 Type 2 diabetes mellitus with other circulatory complications: Secondary | ICD-10-CM | POA: Diagnosis present

## 2019-12-08 DIAGNOSIS — N184 Chronic kidney disease, stage 4 (severe): Secondary | ICD-10-CM | POA: Diagnosis present

## 2019-12-08 DIAGNOSIS — L89153 Pressure ulcer of sacral region, stage 3: Secondary | ICD-10-CM | POA: Diagnosis present

## 2019-12-08 LAB — COMPREHENSIVE METABOLIC PANEL
ALT: 26 U/L (ref 0–44)
AST: 36 U/L (ref 15–41)
Albumin: 3.1 g/dL — ABNORMAL LOW (ref 3.5–5.0)
Alkaline Phosphatase: 213 U/L — ABNORMAL HIGH (ref 38–126)
Anion gap: 10 (ref 5–15)
BUN: 38 mg/dL — ABNORMAL HIGH (ref 8–23)
CO2: 23 mmol/L (ref 22–32)
Calcium: 9.5 mg/dL (ref 8.9–10.3)
Chloride: 109 mmol/L (ref 98–111)
Creatinine, Ser: 1.82 mg/dL — ABNORMAL HIGH (ref 0.44–1.00)
GFR calc Af Amer: 31 mL/min — ABNORMAL LOW (ref >60)
GFR calc non Af Amer: 27 mL/min — ABNORMAL LOW (ref >60)
Glucose, Bld: 198 mg/dL — ABNORMAL HIGH (ref 70–99)
Potassium: 4.5 mmol/L (ref 3.5–5.1)
Sodium: 142 mmol/L (ref 135–145)
Total Bilirubin: 0.8 mg/dL (ref 0.3–1.2)
Total Protein: 7.5 g/dL (ref 6.5–8.1)

## 2019-12-08 LAB — URINALYSIS, ROUTINE W REFLEX MICROSCOPIC
Bilirubin Urine: NEGATIVE
Glucose, UA: NEGATIVE mg/dL
Ketones, ur: NEGATIVE mg/dL
Nitrite: POSITIVE — AB
Protein, ur: 100 mg/dL — AB
Specific Gravity, Urine: 1.013 (ref 1.005–1.030)
WBC, UA: >50 WBC/hpf — ABNORMAL HIGH (ref 0–5)
pH: 5 (ref 5.0–8.0)

## 2019-12-08 LAB — CBC
HCT: 34.5 % — ABNORMAL LOW (ref 36.0–46.0)
Hemoglobin: 11.5 g/dL — ABNORMAL LOW (ref 12.0–15.0)
MCH: 29.2 pg (ref 26.0–34.0)
MCHC: 33.3 g/dL (ref 30.0–36.0)
MCV: 87.6 fL (ref 80.0–100.0)
Platelets: 348 10*3/uL (ref 150–400)
RBC: 3.94 MIL/uL (ref 3.87–5.11)
RDW: 15.4 % (ref 11.5–15.5)
WBC: 16.1 10*3/uL — ABNORMAL HIGH (ref 4.0–10.5)
nRBC: 0 % (ref 0.0–0.2)

## 2019-12-08 MED ORDER — CEPHALEXIN 500 MG PO CAPS
500.0000 mg | ORAL_CAPSULE | Freq: Two times a day (BID) | ORAL | 0 refills | Status: DC
Start: 2019-12-08 — End: 2019-12-13

## 2019-12-08 MED ORDER — GABAPENTIN 600 MG PO TABS
600.0000 mg | ORAL_TABLET | Freq: Once | ORAL | Status: AC
  Administered 2019-12-08: 600 mg via ORAL
  Filled 2019-12-08 (×2): qty 1

## 2019-12-08 MED ORDER — TRAMADOL HCL 50 MG PO TABS
50.0000 mg | ORAL_TABLET | Freq: Once | ORAL | Status: AC
  Administered 2019-12-08: 50 mg via ORAL
  Filled 2019-12-08: qty 1

## 2019-12-08 MED ORDER — SODIUM CHLORIDE 0.9 % IV SOLN
1.0000 g | Freq: Once | INTRAVENOUS | Status: DC
  Filled 2019-12-08: qty 10

## 2019-12-08 MED ORDER — DILTIAZEM HCL 90 MG PO TABS
180.0000 mg | ORAL_TABLET | Freq: Once | ORAL | Status: AC
  Administered 2019-12-08: 180 mg via ORAL
  Filled 2019-12-08: qty 2

## 2019-12-08 MED ORDER — TRAMADOL HCL 50 MG PO TABS: 50.0000 mg | ORAL_TABLET | Freq: Four times a day (QID) | ORAL | 0 refills | Status: AC | PRN

## 2019-12-08 MED ORDER — GABAPENTIN 600 MG PO TABS: 600.0000 mg | ORAL_TABLET | Freq: Two times a day (BID) | ORAL | 0 refills | Status: DC

## 2019-12-08 MED ORDER — CEPHALEXIN 250 MG PO CAPS
500.0000 mg | ORAL_CAPSULE | Freq: Once | ORAL | Status: AC
  Administered 2019-12-08: 500 mg via ORAL
  Filled 2019-12-08: qty 2

## 2019-12-08 NOTE — ED Notes (Addendum)
Pt was told [facility] is on the way to take her home. Pt says she talked to her daughter and daughter would like her to stay overnight, like the PA recommended. Explained to pt steps were already taken to get her home tonight. Pt saying her daughter is not home and there will be no one there to help her. MD notified. MD plans to admit.

## 2019-12-08 NOTE — ED Notes (Signed)
Pt unable to give urine sample at this time saying she has nothing in her stomach. Pt given sandwich and OJ. Bedside commode placed in room.
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
## 2019-12-08 NOTE — ED Notes (Addendum)
Changed bedding for pt as she urinated on herself. Placed a mepilex dressing on buttocks on sore that was existing.

## 2019-12-08 NOTE — Progress Notes (Addendum)
4:35PM: CSW received new phone number from patient for her daughter [name] [phone]. CSW spoke with patient and noted she was unsure why she was at the hospital but reported she was in a lot of pain and had difficulty getting into her bed at home. Patient reported her daughter tried to assist her in bed unsuccessfully. Patient reports she had been there 6-7 months and was discharged yesterday but is unsure of discharge plan. CSW notes per provider who spoke with daughter they were concerned for patient's prescription tramadol and neurotin as they did have the rest of medications. CSW spoke with facility and noted per their supervisor they had no MD on staff and all the medications that were written for discharge were called directly to the pharmacy or would have been in a paper script in patient's belongings. CSW reached out to daughter and confirmed per daughter that the information in patient's belongings included the other medications except for tramadol and neurotin. CSW informed provider of this. CSW notes per provider daughter reports she can have patient back at home if pain is managed. Per daughter [facility] is following up with the family Monday.  CSW acknowledges consult for complicated social environment. CSW discussed concerns and uncertainty of situation with provider. CSW will begin to reach out to collateral and facility to find information on discharge plan and community supports. CSW notes that unless there is a new injury patient would likely not be eligible for another SNF stay. CSW will evaluate situation and see what supports are available to patient.

## 2019-12-08 NOTE — ED Triage Notes (Signed)
Patient arrived by [facility] from home after being discharged from [facility] yesterday. Patient did not receive any discharge instructions and has not had her daily meds that were left at facility and has general pain and has hx of gout. Patient alert and oriented.

## 2019-12-08 NOTE — ED Notes (Signed)
Ptar calleed, no approximate ETA - Sarah Colon
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
## 2019-12-09 DIAGNOSIS — Z841 Family history of disorders of kidney and ureter: Secondary | ICD-10-CM | POA: Diagnosis not present

## 2019-12-09 DIAGNOSIS — R531 Weakness: Secondary | ICD-10-CM | POA: Diagnosis present

## 2019-12-09 DIAGNOSIS — N3000 Acute cystitis without hematuria: Secondary | ICD-10-CM | POA: Diagnosis present

## 2019-12-09 DIAGNOSIS — Z794 Long term (current) use of insulin: Secondary | ICD-10-CM | POA: Diagnosis not present

## 2019-12-09 DIAGNOSIS — N179 Acute kidney failure, unspecified: Secondary | ICD-10-CM | POA: Diagnosis present

## 2019-12-09 DIAGNOSIS — Z8049 Family history of malignant neoplasm of other genital organs: Secondary | ICD-10-CM | POA: Diagnosis not present

## 2019-12-09 DIAGNOSIS — I5032 Chronic diastolic (congestive) heart failure: Secondary | ICD-10-CM | POA: Diagnosis present

## 2019-12-09 DIAGNOSIS — M6282 Rhabdomyolysis: Secondary | ICD-10-CM | POA: Diagnosis present

## 2019-12-09 DIAGNOSIS — L89154 Pressure ulcer of sacral region, stage 4: Secondary | ICD-10-CM | POA: Diagnosis present

## 2019-12-09 DIAGNOSIS — E11610 Type 2 diabetes mellitus with diabetic neuropathic arthropathy: Secondary | ICD-10-CM | POA: Diagnosis present

## 2019-12-09 DIAGNOSIS — H409 Unspecified glaucoma: Secondary | ICD-10-CM | POA: Diagnosis present

## 2019-12-09 DIAGNOSIS — E1122 Type 2 diabetes mellitus with diabetic chronic kidney disease: Secondary | ICD-10-CM | POA: Diagnosis present

## 2019-12-09 DIAGNOSIS — G8929 Other chronic pain: Secondary | ICD-10-CM | POA: Diagnosis present

## 2019-12-09 DIAGNOSIS — N390 Urinary tract infection, site not specified: Secondary | ICD-10-CM | POA: Diagnosis present

## 2019-12-09 DIAGNOSIS — L89153 Pressure ulcer of sacral region, stage 3: Secondary | ICD-10-CM | POA: Diagnosis present

## 2019-12-09 DIAGNOSIS — N184 Chronic kidney disease, stage 4 (severe): Secondary | ICD-10-CM | POA: Diagnosis present

## 2019-12-09 DIAGNOSIS — N10 Acute tubulo-interstitial nephritis: Secondary | ICD-10-CM | POA: Diagnosis present

## 2019-12-09 DIAGNOSIS — Z20822 Contact with and (suspected) exposure to covid-19: Secondary | ICD-10-CM | POA: Diagnosis present

## 2019-12-09 DIAGNOSIS — I130 Hypertensive heart and chronic kidney disease with heart failure and stage 1 through stage 4 chronic kidney disease, or unspecified chronic kidney disease: Secondary | ICD-10-CM | POA: Diagnosis present

## 2019-12-09 DIAGNOSIS — M109 Gout, unspecified: Secondary | ICD-10-CM | POA: Diagnosis present

## 2019-12-09 DIAGNOSIS — M1990 Unspecified osteoarthritis, unspecified site: Secondary | ICD-10-CM | POA: Diagnosis present

## 2019-12-09 DIAGNOSIS — R269 Unspecified abnormalities of gait and mobility: Secondary | ICD-10-CM | POA: Diagnosis present

## 2019-12-09 DIAGNOSIS — E039 Hypothyroidism, unspecified: Secondary | ICD-10-CM | POA: Diagnosis present

## 2019-12-09 DIAGNOSIS — E1142 Type 2 diabetes mellitus with diabetic polyneuropathy: Secondary | ICD-10-CM | POA: Diagnosis present

## 2019-12-09 DIAGNOSIS — Z66 Do not resuscitate: Secondary | ICD-10-CM | POA: Diagnosis present

## 2019-12-09 DIAGNOSIS — N12 Tubulo-interstitial nephritis, not specified as acute or chronic: Secondary | ICD-10-CM | POA: Diagnosis present

## 2019-12-09 LAB — COMPREHENSIVE METABOLIC PANEL
ALT: 21 U/L (ref 0–44)
AST: 25 U/L (ref 15–41)
Albumin: 2.3 g/dL — ABNORMAL LOW (ref 3.5–5.0)
Alkaline Phosphatase: 176 U/L — ABNORMAL HIGH (ref 38–126)
Anion gap: 8 (ref 5–15)
BUN: 41 mg/dL — ABNORMAL HIGH (ref 8–23)
CO2: 22 mmol/L (ref 22–32)
Calcium: 8.9 mg/dL (ref 8.9–10.3)
Chloride: 110 mmol/L (ref 98–111)
Creatinine, Ser: 1.9 mg/dL — ABNORMAL HIGH (ref 0.44–1.00)
GFR calc Af Amer: 29 mL/min — ABNORMAL LOW (ref >60)
GFR calc non Af Amer: 25 mL/min — ABNORMAL LOW (ref >60)
Glucose, Bld: 161 mg/dL — ABNORMAL HIGH (ref 70–99)
Potassium: 4.6 mmol/L (ref 3.5–5.1)
Sodium: 140 mmol/L (ref 135–145)
Total Bilirubin: 0.4 mg/dL (ref 0.3–1.2)
Total Protein: 6.2 g/dL — ABNORMAL LOW (ref 6.5–8.1)

## 2019-12-09 LAB — TSH: TSH: 4.572 u[IU]/mL — ABNORMAL HIGH (ref 0.350–4.500)

## 2019-12-09 LAB — CBC
HCT: 29.2 % — ABNORMAL LOW (ref 36.0–46.0)
Hemoglobin: 9.7 g/dL — ABNORMAL LOW (ref 12.0–15.0)
MCH: 29.6 pg (ref 26.0–34.0)
MCHC: 33.2 g/dL (ref 30.0–36.0)
MCV: 89 fL (ref 80.0–100.0)
Platelets: 281 10*3/uL (ref 150–400)
RBC: 3.28 MIL/uL — ABNORMAL LOW (ref 3.87–5.11)
RDW: 15.7 % — ABNORMAL HIGH (ref 11.5–15.5)
WBC: 14.4 10*3/uL — ABNORMAL HIGH (ref 4.0–10.5)
nRBC: 0 % (ref 0.0–0.2)

## 2019-12-09 LAB — HEMOGLOBIN A1C
Hgb A1c MFr Bld: 7.3 % — ABNORMAL HIGH (ref 4.8–5.6)
Mean Plasma Glucose: 162.81 mg/dL

## 2019-12-09 LAB — CBG MONITORING, ED
Glucose-Capillary: 162 mg/dL — ABNORMAL HIGH (ref 70–99)
Glucose-Capillary: 135 mg/dL — ABNORMAL HIGH (ref 70–99)
Glucose-Capillary: 225 mg/dL — ABNORMAL HIGH (ref 70–99)

## 2019-12-09 LAB — SARS CORONAVIRUS 2 BY RT PCR (HOSPITAL ORDER, PERFORMED IN ~~LOC~~ HOSPITAL LAB): SARS Coronavirus 2: NEGATIVE

## 2019-12-09 LAB — GLUCOSE, CAPILLARY
Glucose-Capillary: 177 mg/dL — ABNORMAL HIGH (ref 70–99)
Glucose-Capillary: 235 mg/dL — ABNORMAL HIGH (ref 70–99)

## 2019-12-09 MED ORDER — TRAMADOL HCL 50 MG PO TABS: 50.0000 mg | ORAL_TABLET | Freq: Four times a day (QID) | ORAL | Status: DC | PRN

## 2019-12-09 MED ORDER — DILTIAZEM HCL 90 MG PO TABS: 180.0000 mg | ORAL_TABLET | Freq: Every day | ORAL | Status: DC

## 2019-12-09 MED ORDER — PANTOPRAZOLE SODIUM 40 MG PO TBEC
40.0000 mg | DELAYED_RELEASE_TABLET | Freq: Every day | ORAL | Status: DC
  Administered 2019-12-09 – 2019-12-13 (×5): 40 mg via ORAL
  Filled 2019-12-09 (×5): qty 1

## 2019-12-09 MED ORDER — LACTATED RINGERS IV BOLUS
500.0000 mL | Freq: Once | INTRAVENOUS | Status: AC
  Administered 2019-12-09: 500 mL via INTRAVENOUS

## 2019-12-09 MED ORDER — LEVOTHYROXINE SODIUM 75 MCG PO TABS
75.0000 ug | ORAL_TABLET | Freq: Every day | ORAL | Status: DC
  Administered 2019-12-09 – 2019-12-13 (×5): 75 ug via ORAL
  Filled 2019-12-09 (×5): qty 1

## 2019-12-09 MED ORDER — MELATONIN 3 MG PO TABS
1.5000 mg | ORAL_TABLET | Freq: Every day | ORAL | Status: DC
  Administered 2019-12-09 – 2019-12-12 (×4): 1.5 mg via ORAL
  Filled 2019-12-09: qty 1
  Filled 2019-12-09 (×2): qty 0.5
  Filled 2019-12-09 (×3): qty 1

## 2019-12-09 MED ORDER — SODIUM CHLORIDE 0.9 % IV SOLN
1.0000 g | INTRAVENOUS | Status: DC
  Administered 2019-12-09 – 2019-12-13 (×5): 1 g via INTRAVENOUS
  Filled 2019-12-09 (×3): qty 10
  Filled 2019-12-09: qty 1
  Filled 2019-12-09: qty 10

## 2019-12-09 MED ORDER — TRAMADOL HCL 50 MG PO TABS
50.0000 mg | ORAL_TABLET | Freq: Two times a day (BID) | ORAL | Status: DC
  Administered 2019-12-09 – 2019-12-13 (×9): 50 mg via ORAL
  Filled 2019-12-09 (×9): qty 1

## 2019-12-09 MED ORDER — ACETAMINOPHEN 650 MG RE SUPP: 650.0000 mg | Freq: Four times a day (QID) | RECTAL | Status: DC | PRN

## 2019-12-09 MED ORDER — ENOXAPARIN SODIUM 30 MG/0.3ML ~~LOC~~ SOLN: 30.0000 mg | SUBCUTANEOUS | Status: DC

## 2019-12-09 MED ORDER — ALLOPURINOL 100 MG PO TABS
100.0000 mg | ORAL_TABLET | Freq: Every day | ORAL | Status: DC
  Administered 2019-12-10 – 2019-12-13 (×4): 100 mg via ORAL
  Filled 2019-12-09 (×4): qty 1

## 2019-12-09 MED ORDER — GABAPENTIN 600 MG PO TABS
600.0000 mg | ORAL_TABLET | Freq: Two times a day (BID) | ORAL | Status: DC
  Administered 2019-12-10 – 2019-12-13 (×8): 600 mg via ORAL
  Filled 2019-12-09 (×8): qty 1

## 2019-12-09 MED ORDER — CEPHALEXIN 250 MG PO CAPS
500.0000 mg | ORAL_CAPSULE | Freq: Two times a day (BID) | ORAL | Status: DC
  Filled 2019-12-09: qty 2

## 2019-12-09 MED ORDER — POLYETHYLENE GLYCOL 3350 17 G PO PACK: 17.0000 g | PACK | Freq: Every day | ORAL | Status: DC | PRN

## 2019-12-09 MED ORDER — METFORMIN HCL 500 MG PO TABS: 500.0000 mg | ORAL_TABLET | Freq: Two times a day (BID) | ORAL | Status: DC

## 2019-12-09 MED ORDER — ACETAMINOPHEN 325 MG PO TABS: 650.0000 mg | ORAL_TABLET | Freq: Four times a day (QID) | ORAL | Status: DC | PRN

## 2019-12-09 MED ORDER — RIVAROXABAN 20 MG PO TABS
20.0000 mg | ORAL_TABLET | Freq: Every day | ORAL | Status: DC
  Administered 2019-12-10: 20 mg via ORAL
  Filled 2019-12-09: qty 1

## 2019-12-09 MED ORDER — INSULIN ASPART 100 UNIT/ML ~~LOC~~ SOLN
0.0000 [IU] | Freq: Three times a day (TID) | SUBCUTANEOUS | Status: DC
  Administered 2019-12-09: 2 [IU] via SUBCUTANEOUS
  Administered 2019-12-09: 1 [IU] via SUBCUTANEOUS
  Administered 2019-12-09: 3 [IU] via SUBCUTANEOUS
  Administered 2019-12-10 (×2): 1 [IU] via SUBCUTANEOUS
  Administered 2019-12-10: 2 [IU] via SUBCUTANEOUS
  Administered 2019-12-11: 3 [IU] via SUBCUTANEOUS
  Administered 2019-12-11 – 2019-12-12 (×3): 2 [IU] via SUBCUTANEOUS
  Administered 2019-12-12: 3 [IU] via SUBCUTANEOUS
  Administered 2019-12-12: 1 [IU] via SUBCUTANEOUS
  Administered 2019-12-13: 5 [IU] via SUBCUTANEOUS
  Administered 2019-12-13: 2 [IU] via SUBCUTANEOUS
  Administered 2019-12-13: 3 [IU] via SUBCUTANEOUS

## 2019-12-09 MED ORDER — INSULIN GLARGINE 100 UNIT/ML ~~LOC~~ SOLN
20.0000 [IU] | Freq: Every day | SUBCUTANEOUS | Status: DC
  Administered 2019-12-09 – 2019-12-12 (×4): 20 [IU] via SUBCUTANEOUS
  Filled 2019-12-09 (×7): qty 0.2

## 2019-12-09 MED ORDER — HYDROCHLOROTHIAZIDE 25 MG PO TABS: 25.0000 mg | ORAL_TABLET | Freq: Every day | ORAL | Status: DC

## 2019-12-09 MED ORDER — METOPROLOL TARTRATE 25 MG PO TABS: 25.0000 mg | ORAL_TABLET | Freq: Two times a day (BID) | ORAL | Status: DC

## 2019-12-09 NOTE — Progress Notes (Signed)
PHARMACIST - PHYSICIAN COMMUNICATION   DESCRIPTION: Pt is on rivaroxaban 20mg  daily for Afib.  Current SCr 1.9 w/ est CrCl 20 ml/min.  Per IMTS progress notes, this may not be AKI but progression of CKD.  Consider decreasing rivaroxaban to 15mg  daily if it is expected that CrCl will continue to be <50.  Thank you. Wynona Neat, PharmD, BCPS 12/09/2019 11:14 PM
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
## 2019-12-09 NOTE — ED Notes (Signed)
Pt sleeping. 

## 2019-12-09 NOTE — Progress Notes (Addendum)
Subjective: Patient reports that she is feeling pretty good today. She states she has a wound in her back that she used to have covered. She reports that she saw a picture of the wound and states that it doesn't look good. We discussed that we have wound care on board for this.   She reports that she had a UTI a few years ago and that she was here at that time. She denies any pain when she urinates, denies any burning sensation. She endorses increased urinary frequency. Discussed that she did have some bacteria in her urine and since she was having the weakness we were concerned about a UTI.   She does not remember declining IV medications.   She reports that her daughter is going to [place], she spoke to her and her daughter wanted her to stay in the hospital.   Objective:  Vital signs in last 24 hours: Vitals:   12/09/19 0901 12/09/19 1000 12/09/19 1030 12/09/19 1046  BP: 129/77 (!) 143/58 (!) 146/78   Pulse: (!) 105 67 99   Resp: 20 18 (!) 23   Temp:      TempSrc:      SpO2: 98% 98%  96%  Weight:      Height:       General: Elderly female, NAD, sitting up in bed getting ready to eat breakfast. Cardiac: Regular rate and normal rhythm. No m/r/g noted. Pulmonary: CTABL, no wheezing, rhonchi, or rales Abdomen: Soft and non-distended, with no rebound or guarding. Normoactive bowel sounds in all quadrants. Positive for suprapubic tenderness. Positive for costovertebral angle tenderness L > R. Extremities: 1+ pedal edema bilaterally.   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)   Chronic diastolic heart failure (HCC)   Hypertension associated with diabetes (HCC)   AKI (acute kidney injury) ([place])   PAF (paroxysmal atrial fibrillation) (HCC)   Urinary tract infection  Patient is a 74 year old female with history of insulin-dependent DM w/neuropathy, HFpEF, HTN, paroxysmal A-fib on  xarelto, Stage 3 CKD, gout, and hypothyroidism presenting with generalized weakness, urinary frequency, suprapubic tenderness, and L>R CVA tenderness concerning for acute pyelonephritis.  Acute pyelonephritis: Generalized weakness. UA showed large Hgb, positive nitries, large leukocytes, and >50 WBC. (+) suprapubic tenderness and L>R CVA tenderness. Initially refused IV abx and thus started on Keflex in the ED. Agreeable to IV medications when we spoke with her on 12/09/19 and so we switched her from PO Keflex to IV ceftriaxone. -continue ceftriaxone 1g q24h -f/u urine culture -daily BMP -PT/OT evaluation  AKI on Stage III CKD: Possibly not an AKI and simply a progression of her CKD, as her previous creatinine before current admission was also elevated at 2.2 compared to previous baseline of 1.2.  -At admission, creatinine 1.82 -On 12/09/19, creatinine 1.90 -received 1L bolus of LR, heart rate is improved -monitor urine output and give additional fluids as needed  Weakness: Likely 2/2 pyelonephritis, will treat as above. -Added TSH to rule out thyroid abnormality -PT/OT eval  Sacral decubitus ulcer: Appreciate wound consult, follow recs.  HTN: BP has been soft, held her home diltiazem and metoprolol.   Hypothyroidism: -Continue home levothyroxine 75 mcg daily   Prior to Admission Living Arrangement: Home Anticipated Discharge Location: TBD, possibly SNF Barriers to Discharge: Continued evaluation Dispo: Anticipated discharge in approximately 2-3 day(s).   Virl Axe, MD 12/09/2019, 12:41 PM Pager: [phone] After 5pm on weekdays  and 1pm on weekends: On Call pager [phone]

## 2019-12-09 NOTE — ED Notes (Signed)
PT at bedside.

## 2019-12-09 NOTE — ED Notes (Signed)
Breakfast ordered 

## 2019-12-09 NOTE — ED Notes (Signed)
Lunch tray ordered placed

## 2019-12-10 LAB — BASIC METABOLIC PANEL
Anion gap: 10 (ref 5–15)
BUN: 43 mg/dL — ABNORMAL HIGH (ref 8–23)
CO2: 19 mmol/L — ABNORMAL LOW (ref 22–32)
Calcium: 8.6 mg/dL — ABNORMAL LOW (ref 8.9–10.3)
Chloride: 108 mmol/L (ref 98–111)
Creatinine, Ser: 1.84 mg/dL — ABNORMAL HIGH (ref 0.44–1.00)
GFR calc Af Amer: 30 mL/min — ABNORMAL LOW (ref >60)
GFR calc non Af Amer: 26 mL/min — ABNORMAL LOW (ref >60)
Glucose, Bld: 181 mg/dL — ABNORMAL HIGH (ref 70–99)
Potassium: 4.7 mmol/L (ref 3.5–5.1)
Sodium: 137 mmol/L (ref 135–145)

## 2019-12-10 LAB — GLUCOSE, CAPILLARY
Glucose-Capillary: 122 mg/dL — ABNORMAL HIGH (ref 70–99)
Glucose-Capillary: 131 mg/dL — ABNORMAL HIGH (ref 70–99)
Glucose-Capillary: 200 mg/dL — ABNORMAL HIGH (ref 70–99)
Glucose-Capillary: 172 mg/dL — ABNORMAL HIGH (ref 70–99)

## 2019-12-10 LAB — URINE CULTURE

## 2019-12-10 MED ORDER — METOPROLOL TARTRATE 25 MG PO TABS
25.0000 mg | ORAL_TABLET | Freq: Two times a day (BID) | ORAL | Status: DC
  Administered 2019-12-10 – 2019-12-13 (×7): 25 mg via ORAL
  Filled 2019-12-10 (×7): qty 1

## 2019-12-10 MED ORDER — DILTIAZEM HCL 60 MG PO TABS
60.0000 mg | ORAL_TABLET | Freq: Three times a day (TID) | ORAL | Status: DC
  Administered 2019-12-10 – 2019-12-13 (×11): 60 mg via ORAL
  Filled 2019-12-10 (×11): qty 1

## 2019-12-10 MED ORDER — RIVAROXABAN 15 MG PO TABS
15.0000 mg | ORAL_TABLET | Freq: Every day | ORAL | Status: DC
  Administered 2019-12-11 – 2019-12-13 (×3): 15 mg via ORAL
  Filled 2019-12-10 (×3): qty 1

## 2019-12-10 NOTE — Plan of Care (Signed)
  Problem: Education: Goal: Knowledge of General Education information will improve Description: Including pain rating scale, medication(s)/side effects and non-pharmacologic comfort measures Outcome: Progressing   Problem: Health Behavior/Discharge Planning: Goal: Ability to manage health-related needs will improve Outcome: Progressing   Problem: Clinical Measurements: Goal: Respiratory complications will improve Outcome: Progressing Goal: Cardiovascular complication will be avoided Outcome: Progressing   Problem: Nutrition: Goal: Adequate nutrition will be maintained Outcome: Progressing   Problem: Elimination: Goal: Will not experience complications related to bowel motility Outcome: Progressing Goal: Will not experience complications related to urinary retention Outcome: Progressing   Problem: Pain Managment: Goal: General experience of comfort will improve Outcome: Progressing   Problem: Safety: Goal: Ability to remain free from injury will improve Outcome: Progressing   Problem: Skin Integrity: Goal: Risk for impaired skin integrity will decrease Outcome: Progressing

## 2019-12-10 NOTE — Social Work (Signed)
CSW has reached out to [facility] admissions liaison [facility].  Pt was discharged Friday with [place] at the request of pt and her daughter Britt Boozer. CSW, MD, and PT have all tried to get daughter on phone to discuss recommendations at [phone]. No answer, no voicemail, phone appears to be turned off.   TOC team will continue to try and reach family.  Discussed if we cannot get family that we may need a wellness check.   Westley Hummer, MSW, [place] [facility]

## 2019-12-10 NOTE — Progress Notes (Signed)
Dressing changed as ordered. Pt tolerated well. Will continue to monitor.

## 2019-12-10 NOTE — Progress Notes (Signed)
As per PT and OT, patient will require 24 hour supervision/assistance in order to go home. I tried calling patient's daughter twice as per the number in chart, but was unable to reach her or leave a voicemail. PT also tried but faced the same issue. Until we are able to contact patient's family, we are unable to discharge her home.  Will continue IV ceftriaxone 1g q24h until patient can be discharged and then switch her to PO ciprofloxacin 500mg  BID (for a total of 7 days).
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
## 2019-12-10 NOTE — Progress Notes (Signed)
Subjective: Examined patient at bedside.  States that she feels good today. Still reports having some soreness in her lower middle stomach. States that she has urinating as per normal, with mild discomfort when urinating.  Discussed with her about our plan to give her one more dose of IV ceftriaxone today before discharging her with a 5-day course of oral ciprofloxacin for management of her UTI. She confirms understanding and agrees with the plan. All questions and concerns were addressed.  Objective:  Vital signs in last 24 hours: Vitals:   12/09/19 1326 12/09/19 1334 12/09/19 2134 12/10/19 0620  BP: (!) 156/84 (!) 156/79 (!) 155/72 (!) 149/80  Pulse: (!) 106 (!) 104 80 71  Resp:  20 18 17   Temp: 98.4 F (36.9 C)  97.9 F (36.6 C) 98.2 F (36.8 C)  TempSrc: Oral  Oral Oral  SpO2: 100% 100% 100% 99%  Weight:      Height:       General: Elderly female, NAD, lying in bed. Cardiac: Regular rate and normal rhythm. No m/r/g noted. Pulmonary: clear to auscultation bilaterally. No wheezing, rhonchi, or rales noted. Abdomen: Soft and non-distended, with no rebound or guarding. Normoactive bowel sounds in all quadrants. Positive for suprapubic tenderness. Positive for costovertebral angle tenderness L > R. Extremities: 1+ pedal edema bilaterally.  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 Will  treat with 2 days of IV ceftriaxone and transition to 5-day course of PO ciprofloxacin 500mg  BID if renal function permits.  1. Acute pyelonephritis: Generalized weakness. UA showed large Hgb, positive nitries, large leukocytes, and >50 WBC. (+) suprapubic tenderness and L>R CVA tenderness. Initially refused IV abx and thus started on Keflex in the ED. Agreeable to IV medications when we spoke with her on 12/09/19 and so we switched her from PO Keflex to IV ceftriaxone. -On 12/10/19, urine cultures showed multiple species in urine. Will give on more dose of IV ceftriaxone and then transition to PO ciprofloxacin (if renal function is stable) for a total 7-day course of abx. Still unsure if patient has 24 hour care at home (tried calling daughter multiple times but unable to reach) so awaiting PT/SW decision as to home health vs LTC placement.  -Urine culture --> multiple species present -f/u BMP -give one more dose of IV ceftriaxone 1g q24h (for total of 2 doses) -if renal function stable, discharge with 5-day course of PO ciprofloxacin 500mg  BID (if renal function decreased, will renally dose cipro)  2. AKI on Stage III CKD: Possibly not an AKI and simply a progression of her CKD, as her previous creatinine before current admission was also elevated at 2.2 compared to previous baseline of 1.2.  -At admission, creatinine 1.82 -On 12/09/19, creatinine 1.90 -f/u BMP today -monitor urine output and give additional fluids as needed  3. Weakness: Likely 2/2 pyelonephritis, will treat as above. -TSH 4.572 -PT/OT eval  4. Sacral decubitus ulcer: Appreciate wound consult, follow recs.  5. HTN: BP stable in 140s -  150s/70s. -Resumed home metoprolol and diltiazem -Monitor BP  6. Hypothyroidism: -TSH 4.572 -Continue home levothyroxine 75 mcg daily -f/u as outpatient  Prior to Admission Living Arrangement: Home Anticipated Discharge Location: TBD, awaiting PT/SW evaluation to decide  between home health vs LTC placement Barriers to Discharge: None Dispo: Anticipated discharge today.   Virl Axe, MD 12/10/2019, 11:34 AM Pager: [phone] After 5pm on weekdays and 1pm on weekends: On Call pager [phone]

## 2019-12-11 LAB — GLUCOSE, CAPILLARY
Glucose-Capillary: 160 mg/dL — ABNORMAL HIGH (ref 70–99)
Glucose-Capillary: 180 mg/dL — ABNORMAL HIGH (ref 70–99)
Glucose-Capillary: 225 mg/dL — ABNORMAL HIGH (ref 70–99)
Glucose-Capillary: 144 mg/dL — ABNORMAL HIGH (ref 70–99)

## 2019-12-11 MED ORDER — HYDROCHLOROTHIAZIDE 25 MG PO TABS
25.0000 mg | ORAL_TABLET | Freq: Every day | ORAL | Status: DC
  Administered 2019-12-11 – 2019-12-12 (×2): 25 mg via ORAL
  Filled 2019-12-11 (×2): qty 1

## 2019-12-11 NOTE — Plan of Care (Signed)
  Problem: Nutrition: Goal: Adequate nutrition will be maintained Outcome: Progressing   Problem: Coping: Goal: Level of anxiety will decrease Outcome: Progressing   Problem: Elimination: Goal: Will not experience complications related to bowel motility Outcome: Progressing Goal: Will not experience complications related to urinary retention Outcome: Progressing   

## 2019-12-11 NOTE — Progress Notes (Signed)
Subjective: Examined patient at bedside. Reports that she is feeling fine.   Discussed with her about our failed attempts to contact her daughter. She reports that the number we have on file isn't her daughter's correct number. She states that the correct number is [phone].   When asked if her daughter would be able to stay with her all day to care for her, she replied that her daughter would maybe be able to stay for some time during the day but not at night. When asked if she would consider SNF placement, she reports that she is not going to go to a nursing facility.  We discussed that we should be able to discharge her today if we can get a hold of her daughter to make sure she is home.  All questions and concerns were addressed.   Objective:  Vital signs in last 24 hours: Vitals:   12/10/19 2153 12/10/19 2155 12/11/19 0500 12/11/19 0558  BP: (!) 134/52 (!) 134/52  140/62  Pulse:  89  68  Resp:    14  Temp:    99.1 F (37.3 C)  TempSrc:    Oral  SpO2:    97%  Weight:   63.7 kg   Height:       Physical Exam:  General: Elderly female, NAD, lying in bed. Cardiac:Regular rate and normal rhythm. No m/r/g noted. Pulmonary: clear to auscultation bilaterally. No wheezing, rhonchi, or rales noted. Abdomen:Soft and non-distended, with no rebound or guarding. Normoactive bowel sounds in all quadrants. Positive for mild suprapubic tenderness. No CVA tenderness on exam today. Extremities: 1+ pedal edema bilaterally.  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 Will treat with 3 days of IV ceftriaxone and transition to 4-day course of PO ciprofloxacin 500mg  daily.  1. Acute pyelonephritis: Generalized weakness. UAshowed large Hgb, positive nitries, large leukocytes, and >50 WBC. (+) suprapubic tenderness and L>R CVA tenderness. Initially refused IV abx and thus started on Keflexin the ED.Agreeable to IV medications when we spoke with heron 7/11/21and so we switched her from PO Keflex to IV ceftriaxone. -On 12/10/19, urine cultures showed multiple species in urine. Will give on more dose of IV ceftriaxone and then transition to PO ciprofloxacin (if renal function is stable) for a total 7-day course of abx. Still unsure if patient has 24 hour care at home (tried calling daughter multiple times but unable to reach) so awaiting PT/SW decision as to home health vs LTC placement. -On 12/11/19, patient is clinically improving. Tried calling patient's daughter on the phone number that the patient gave me, but still unable to reach her. I did leave a voicemail. Otherwise, the patient is medically stable for discharge.  -Urine culture --> multiple species present -BMP showing stable, but reduced renal function (CrCl 26) -give one more dose of IV ceftriaxone 1g q24h (for total of 3 doses) -will discharge with 4-day course of PO ciprofloxacin 500mg  daily (renally dosed due to reduced CrCl)  2. AKI on  Stage III CKD: Possibly not an AKI and simply a progression of her CKD, as her previous creatinine before current admission was also elevated at 2.2 compared to previous baseline of 1.2.  -At admission, creatinine 1.82 -On 12/09/19, creatinine 1.90 ---> creatinine 1.84 (12/10/19) -BMP showing stable, but reduced renal function (CrCl 26) -monitor urine output and give additional fluids as  needed  3. Weakness: Likely 2/2 pyelonephritis, will treat as above. -TSH 4.572 -PT/OT eval  4. Sacral decubitus ulcer: Appreciate wound consult, follow recs.  5. HTN: BP stable in 140s - 150s/70s. -Resumed home metoprolol, diltiazem, and hydrochlorothiazide -Monitor BP  6. Hypothyroidism: -TSH 4.572 -Continue home levothyroxine 75 mcg daily -f/u as outpatient  Prior to Admission Living Arrangement: Home Anticipated Discharge Location: TBD, likely home with home health services but awaiting contact with patient's daughter Barriers to Discharge: Contacting family Dispo: Anticipated discharge today.   Virl Axe, MD 12/11/2019, 1:37 PM Pager: [phone] After 5pm on weekdays and 1pm on weekends: On Call pager [phone]

## 2019-12-11 NOTE — TOC Progression Note (Signed)
Transition of Care [facility]) - Progression Note    Patient Details  Name: Sarah Colon MRN: [id number] Date of Birth: 12/26/1945  Transition of Care [facility]) CM/SW [place], [facility] Phone Number: 12/11/2019, 2:05 PM  Clinical Narrative:    CSW received consult from physician concerning disposition.  CSW left vm for daughter, [name] at [phone] to return phone call.  TOC Team will continue to follow for disposition planning.        Expected Discharge Plan and Services                                                 Social Determinants of Health (SDOH) Interventions    Readmission Risk Interventions No flowsheet data found.

## 2019-12-12 LAB — GLUCOSE, CAPILLARY
Glucose-Capillary: 133 mg/dL — ABNORMAL HIGH (ref 70–99)
Glucose-Capillary: 210 mg/dL — ABNORMAL HIGH (ref 70–99)
Glucose-Capillary: 199 mg/dL — ABNORMAL HIGH (ref 70–99)
Glucose-Capillary: 154 mg/dL — ABNORMAL HIGH (ref 70–99)

## 2019-12-12 NOTE — TOC Progression Note (Addendum)
Transition of Care [facility]) - Progression Note    Patient Details  Name: Sarah Colon MRN: [id number] Date of Birth: 1945-08-14  Transition of [facility]) CM/SW Contact  Jacalyn Lefevre Edson Snowball, RN Phone Number: 12/12/2019, 2:52 PM  Clinical Narrative:     Spoke to patient's daughter Sarah Colon [phone] by phone. [name] plans to take her mother home at discharge with HHPT with [facility]. Spoke with Butch Penny with [facility] and she can accept ( referred prior to admission).   Will need orders. Messaged MD.   Daughter requesting patient to be discharged by [facility] to [address] [place] [address]   Patient already has a hospital bed wheelchair and walker at home.     Expected Discharge Plan: [place]    Expected Discharge Plan and Services Expected Discharge Plan: [place]   Discharge Planning Services: CM Consult Post Acute Care Choice: [place] arrangements for the past 2 months: Single Family Home                   DME Agency: NA         HH Agency: [place] ([place]) Date [place]: 12/12/19 Time [place]: [place] Representative spoke with at [place]: [place] ([place]) Interventions    Readmission Risk Interventions No flowsheet data found.

## 2019-12-12 NOTE — Plan of Care (Signed)
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
## 2019-12-12 NOTE — TOC Progression Note (Addendum)
Transition of Care [facility]) - Progression Note    Patient Details  Name: Sarah Colon MRN: [id number] Date of Birth: [birth year]  Transition of Care [facility]) CM/SW Corazon, [place] Phone Number: 12/12/2019, 2:40 PM  Clinical Narrative:     CSW met with pt at bedside.  Pt gave me the phone number to contact her daughter  [phone].  Pt called daughter, [name] and CSW left message on phone for daughter to return phone call.  Pt's daughter returned phone call.  CSW discuss with pt's daughter pt's pending discharge.  Pt's daughter spoke with CM Heather and CSW by phone that pt's will come home with [facility] possible tomorrow.  Pt's daughter home address is [address] [place], [place] [address].  CM Heather will contact [facility] for [facility] needs.  TOC Team will continue to assist for discharge planning.       Expected Discharge Plan and Services                                                 Social Determinants of Health (SDOH) Interventions    Readmission Risk Interventions No flowsheet data found.

## 2019-12-13 ENCOUNTER — Ambulatory Visit: Payer: Medicare Other | Admitting: Internal Medicine

## 2019-12-13 LAB — BASIC METABOLIC PANEL
Anion gap: 9 (ref 5–15)
BUN: 55 mg/dL — ABNORMAL HIGH (ref 8–23)
CO2: 21 mmol/L — ABNORMAL LOW (ref 22–32)
Calcium: 8.8 mg/dL — ABNORMAL LOW (ref 8.9–10.3)
Chloride: 106 mmol/L (ref 98–111)
Creatinine, Ser: 2.29 mg/dL — ABNORMAL HIGH (ref 0.44–1.00)
GFR calc Af Amer: 23 mL/min — ABNORMAL LOW (ref >60)
GFR calc non Af Amer: 20 mL/min — ABNORMAL LOW (ref >60)
Glucose, Bld: 207 mg/dL — ABNORMAL HIGH (ref 70–99)
Potassium: 4.8 mmol/L (ref 3.5–5.1)
Sodium: 136 mmol/L (ref 135–145)

## 2019-12-13 LAB — GLUCOSE, CAPILLARY
Glucose-Capillary: 155 mg/dL — ABNORMAL HIGH (ref 70–99)
Glucose-Capillary: 202 mg/dL — ABNORMAL HIGH (ref 70–99)
Glucose-Capillary: 265 mg/dL — ABNORMAL HIGH (ref 70–99)

## 2019-12-13 MED ORDER — CIPROFLOXACIN HCL 500 MG PO TABS: 500.0000 mg | ORAL_TABLET | ORAL | 0 refills | Status: DC

## 2019-12-13 MED ORDER — CIPROFLOXACIN HCL 500 MG PO TABS: 500.0000 mg | ORAL_TABLET | ORAL | 0 refills | Status: AC

## 2019-12-13 MED ORDER — RIVAROXABAN 15 MG PO TABS: 15.0000 mg | ORAL_TABLET | Freq: Every day | ORAL | 0 refills | Status: DC

## 2019-12-13 NOTE — Discharge Summary (Signed)
Name: Sarah Colon MRN: [id number] DOB: [birth year] 74 y.o. PCP: Raymondo Band, MD  Date of Admission: 12/08/2019  1:21 PM Date of Discharge: 12/13/2019 Attending Physician: Lucious Groves, DO  Discharge Diagnosis: 1. Acute Pyelonephritis  Discharge Medications: Allergies as of 12/13/2019      Reactions   Codeine Other (See Comments)   Causes shaking      Medication List    STOP taking these medications   hydrochlorothiazide 25 MG tablet Commonly known as: HYDRODIURIL     TAKE these medications   acetaminophen 325 MG tablet Commonly known as: TYLENOL Take 650 mg by mouth every 6 (six) hours as needed.   albuterol (2.5 MG/3ML) 0.083% nebulizer solution Commonly known as: PROVENTIL Take 2.5 mg by nebulization every 4 (four) hours as needed for wheezing or shortness of breath.   allopurinol 100 MG tablet Commonly known as: ZYLOPRIM Take 1 tablet (100 mg total) by mouth daily.   alum & mag hydroxide-simeth 200-200-20 MG/5ML suspension Commonly known as: MAALOX/MYLANTA Take 10 mLs by mouth 3 (three) times daily as needed for indigestion or heartburn.   calcium carbonate 500 MG chewable tablet Commonly known as: TUMS - dosed in mg elemental calcium Chew 2 tablets by mouth 3 (three) times daily as needed for indigestion or heartburn.   cholecalciferol 25 MCG (1000 UNIT) tablet Commonly known as: VITAMIN D3 Take 1,000 Units by mouth daily.   ciprofloxacin 500 MG tablet Commonly known as: CIPRO Take 1 tablet (500 mg total) by mouth daily for 7 days.   ciprofloxacin 500 MG tablet Commonly known as: CIPRO Take 1 tablet (500 mg total) by mouth daily for 2 days.   colchicine 0.6 MG tablet Take 1.2 mg by mouth daily as needed.   Diabetic Tussin EX 100 MG/5ML syrup Generic drug: guaifenesin Take 200 mg by mouth every 4 (four) hours as needed for cough.   guaiFENesin 600 MG 12 hr tablet Commonly known as: MUCINEX Take 1,200 mg by mouth every 12 (twelve)  hours as needed for cough or to loosen phlegm.   diltiazem 90 MG tablet Commonly known as: CARDIZEM Take 180 mg by mouth daily.   doxycycline 100 MG tablet Commonly known as: VIBRA-TABS Take 1 tablet (100 mg total) by mouth every 12 (twelve) hours.   feeding supplement (GLUCERNA SHAKE) Liqd Take 237 mLs by mouth daily. Chocolate   feeding supplement (PRO-STAT SUGAR FREE 64) Liqd Take 30 mLs by mouth 2 (two) times daily.   ferrous sulfate 325 (65 FE) MG tablet Take 325 mg by mouth daily.   gabapentin 600 MG tablet Commonly known as: NEURONTIN Take 1 tablet (600 mg total) by mouth 2 (two) times daily.   glucagon 1 MG Solr injection Commonly known as: GLUCAGEN Inject 1 mg into the muscle as needed for low blood sugar.   insulin glargine 100 UNIT/ML injection Commonly known as: LANTUS Inject 0.2 mLs (20 Units total) into the skin at bedtime.   levothyroxine 75 MCG tablet Commonly known as: SYNTHROID Take 75 mcg by mouth daily.   Melatonin 1 MG Caps Take 1 mg by mouth at bedtime.   metFORMIN 500 MG tablet Commonly known as: GLUCOPHAGE Take 500 mg by mouth 2 (two) times daily with a meal.   metoprolol tartrate 25 MG tablet Commonly known as: LOPRESSOR Take 25 mg by mouth 2 (two) times daily.   pantoprazole 40 MG tablet Commonly known as: PROTONIX Take 40 mg by mouth daily.   pioglitazone 15 MG tablet  Commonly known as: ACTOS Take 15 mg by mouth daily.   polyethylene glycol 17 g packet Commonly known as: MIRALAX / GLYCOLAX Take 17 g by mouth daily as needed for mild constipation.   rivaroxaban 20 MG Tabs tablet Commonly known as: XARELTO Take 20 mg by mouth daily.   senna 8.6 MG tablet Commonly known as: SENOKOT Take 2 tablets by mouth daily.   simethicone 125 MG chewable tablet Commonly known as: MYLICON Chew 518 mg by mouth every 6 (six) hours as needed for flatulence.   tamsulosin 0.4 MG Caps capsule Commonly known as: FLOMAX Take 1 capsule (0.4 mg  total) by mouth daily after supper.   traMADol 50 MG tablet Commonly known as: ULTRAM Take 1 tablet (50 mg total) by mouth every 6 (six) hours as needed for moderate pain or severe pain.            Discharge Care Instructions  (From admission, onward)         Start     Ordered   12/13/19 0000  Change dressing (specify)       Comments: Cleanse with NS, pat dry. Cover with size appropriate piece of xeroform gauze Kellie Simmering # 294). Top with dry gauze 2x2 and top with silicone foam for sacrum. Turn side to side a nd minimize time in the supine position.   12/13/19 1352          Disposition and follow-up:   Sarah Colon was discharged from [facility] in Stable condition.  At the hospital follow up visit please address:  1.  Acute Pyelonephritis. Given 5 days of IV Rocephin. Discharged with 2 days of PO Ciprofloxacin 500mg  daily (due to decreased CrCl).  2. CKD Stage 4. Baseline creatinine between 1.82 - 2.2. Creatinine 2.29 on date of discharge. Discontinued HCTZ and metformin. F/u BMP as outpatient. -Complaining of incontinence, used to follow urologist at [facility] but stopped going. Set up f/u appointment with Dr. Ronal Fear at [facility] on 12/27/19.  3. Deconditioning. History of hypothyroidism on synthroid 49mcg daily with TSH 4.572 (f/u outpatient). Discharged with home health services (PT, RN).   4. Sacral decubitus ulcer. Pressure injury to sacrum, healing. 1cm x 0.5cm x 0.4cm. Discharged with wound care instructions.  5.  Labs / imaging needed at time of follow-up: BMP, CBC, UA  6  Pending labs/ test needing follow-up: None  Follow-up Appointments: -f/u Urology with Dr. Ronal Fear at [facility] on 12/27/19 -f/u PCP at [facility] [place].   Specialty: Emergency Medicine Why: return to the ER for new or worsening  symptoms Contact information: [address] [id number] [place], [place] Follow up.   Contact information: [place] [address] [phone]               Hospital Course by problem list: 1. Acute Pyelonephritis. Presented with generalized weakness and urinary frequency. UA showed large Hgb, positive nitrites, large leukocytes, and >50 WBCs. Positive suprapubic tenderness and CVA tenderness L>R on physical exam. Urine cultures showed multiple species in urine. Given 5 days of IV Rocephin. Discharged with 2 days of PO Ciprofloxacin 500mg  daily (due to decreased CrCl).  2. CKD Stage 4. At admission, Cr 1.82. Unsure if this was her new baseline or AKI. However, in May 2021, Cr  2.19. Throughout admission, Cr stayed between 1.84-1.9 despite therapy. Began HCTZ therapy (home medication) due to presence of pedal edema and mild HTN. Creatinine increased to 2.29 on date of discharge. Discontinued HCTZ and metformin. Encouraged patient to increase PO fluid intake after discharge.  3. Deconditioning. Presented with generalized weakness. Likely worsened by pyelonephritis but relatively deconditioned at baseline. History of hypothyroidism on synthroid 25mcg daily with TSH 4.572 (f/u outpatient). Discharged with home health services (PT, RN).   4. Sacral decubitus ulcer. Pressure injury to sacrum, healing. 1cm x 0.5cm x 0.4cm. Discharged with wound care instructions.  Discharge Vitals:   BP (!) 152/59 (BP Location: Right Arm)   Pulse 66   Temp 98.2 F (36.8 C) (Oral)   Resp 18   Ht 5' (1.524 m)   Wt 63.3 kg   SpO2 100%   BMI 27.25 kg/m   Pertinent Labs, Studies, and Procedures:  CBC Latest Ref Rng & Units 12/09/2019 12/08/2019 10/24/2019  WBC 4.0 - 10.5 K/uL 14.4(H) 16.1(H) 16.5(H)  Hemoglobin 12.0 - 15.0 g/dL 9.7(L) 11.5(L) 11.9(L)  Hematocrit 36 - 46 % 29.2(L) 34.5(L) 36.0    Platelets 150 - 400 K/uL 281 348 445(H)   BMP Latest Ref Rng & Units 12/13/2019 12/10/2019 12/09/2019  Glucose 70 - 99 mg/dL 207(H) 181(H) 161(H)  BUN 8 - 23 mg/dL 55(H) 43(H) 41(H)  Creatinine 0.44 - 1.00 mg/dL 2.29(H) 1.84(H) 1.90(H)  Sodium 135 - 145 mmol/L 136 137 140  Potassium 3.5 - 5.1 mmol/L 4.8 4.7 4.6  Chloride 98 - 111 mmol/L 106 108 110  CO2 22 - 32 mmol/L 21(L) 19(L) 22  Calcium 8.9 - 10.3 mg/dL 8.8(L) 8.6(L) 8.9   CMP Latest Ref Rng & Units 12/13/2019 12/10/2019 12/09/2019  Glucose 70 - 99 mg/dL 207(H) 181(H) 161(H)  BUN 8 - 23 mg/dL 55(H) 43(H) 41(H)  Creatinine 0.44 - 1.00 mg/dL 2.29(H) 1.84(H) 1.90(H)  Sodium 135 - 145 mmol/L 136 137 140  Potassium 3.5 - 5.1 mmol/L 4.8 4.7 4.6  Chloride 98 - 111 mmol/L 106 108 110  CO2 22 - 32 mmol/L 21(L) 19(L) 22  Calcium 8.9 - 10.3 mg/dL 8.8(L) 8.6(L) 8.9  Total Protein 6.5 - 8.1 g/dL - - 6.2(L)  Total Bilirubin 0.3 - 1.2 mg/dL - - 0.4  Alkaline Phos 38 - 126 U/L - - 176(H)  AST 15 - 41 U/L - - 25  ALT 0 - 44 U/L - - 21   Urinalysis    Component Value Date/Time   COLORURINE YELLOW 12/08/2019 1859   APPEARANCEUR CLOUDY (A) 12/08/2019 1859   LABSPEC 1.013 12/08/2019 1859   PHURINE 5.0 12/08/2019 1859   GLUCOSEU NEGATIVE 12/08/2019 1859   HGBUR LARGE (A) 12/08/2019 1859   BILIRUBINUR NEGATIVE 12/08/2019 1859   KETONESUR NEGATIVE 12/08/2019 1859   PROTEINUR 100 (A) 12/08/2019 1859   UROBILINOGEN 0.2 09/07/2014 2000   NITRITE POSITIVE (A) 12/08/2019 1859   LEUKOCYTESUR LARGE (A) 12/08/2019 1859    EKG: Significant artifact, but shows sinus rhythm with T wave inversions consistent with prior EKGs.    Discharge Instructions: Discharge Instructions    (HEART FAILURE PATIENTS) Call MD:  Anytime you have any of the following symptoms: 1) 3 pound weight gain in 24 hours or 5 pounds in 1 week 2) shortness of breath, with or without a dry hacking cough 3) swelling in the hands, feet or stomach 4) if you have to sleep on extra  pillows at night in order to breathe.   Complete by: As directed  Call MD for:  difficulty breathing, headache or visual disturbances   Complete by: As directed    Call MD for:  extreme fatigue   Complete by: As directed    Call MD for:  hives   Complete by: As directed    Call MD for:  persistant dizziness or light-headedness   Complete by: As directed    Call MD for:  persistant nausea and vomiting   Complete by: As directed    Call MD for:  redness, tenderness, or signs of infection (pain, swelling, redness, odor or green/yellow discharge around incision site)   Complete by: As directed    Call MD for:  severe uncontrolled pain   Complete by: As directed    Call MD for:  temperature >100.4   Complete by: As directed    Change dressing (specify)   Complete by: As directed    Cleanse with NS, pat dry. Cover with size appropriate piece of xeroform gauze Kellie Simmering # 294). Top with dry gauze 2x2 and top with silicone foam for sacrum. Turn side to side a nd minimize time in the supine position.   Diet - low sodium heart healthy   Complete by: As directed    Discharge instructions   Complete by: As directed    Sarah Colon, it was a pleasure taking care of you during your time here. You were brought in for generalized weakness and increased urinary frequency and were found to have a UTI. We gave you 5 days of antibiotics here at the hospital and we need you to take 2 more days of antibiotics at home.  1. Please take the Ciprofloxacin tablet (antibiotic) once a day starting from tomorrow for a total of 2 days. So, please take one dose tomorrow (12/14/19) and one dose the day after tomorrow (12/15/19).  2. We set up an appointment with a urologist for you at [facility]. The appointment is on December 27, 2019 at 1:45pm with Dr. Ronal Fear.  3. You should be hearing from the [facility] regarding a follow up visit and to establish a doctor here as your primary care doctor.  If you do not hear back in 2 days, please call them at [phone].   Increase activity slowly   Complete by: As directed       Signed: Virl Axe, MD 12/13/2019, 2:48 PM   Pager: [phone]

## 2019-12-13 NOTE — Progress Notes (Signed)
Subjective: Examined patient at bedside.  Her back is sore today. She has not been out of bed. Discussed stretching and work with PT. She otherwise is feeling well. Discussed discharge plan and the importance of drinking lots of fluids after discharge.  She does not have a primary care doctor. Discussed with patient that we will set up an appointment at the IM clinic so that she can establish a PCP. Patient agrees.  States that she has been dealing with incontinence for a while now. Used to see a urologist for it but she has been unable to follow up. Discussed with patient that we will help set up an appointment with a urologist as an outpatient. Patient agrees.  Patient confirms understanding of taking the remaining two doses of PO antibiotics to complete her 7 day course. She agrees with plan. All questions and concerns were addressed.  Objective:  Vital signs in last 24 hours: Vitals:   12/12/19 1505 12/12/19 2056 12/13/19 0507 12/13/19 1326  BP: (!) 143/78 (!) 140/58 (!) 132/59 (!) 152/59  Pulse: 62 64 (!) 58 66  Resp: 16 16 16 18   Temp: 97.7 F (36.5 C) 99 F (37.2 C) 97.9 F (36.6 C) 98.2 F (36.8 C)  TempSrc: Oral Oral Oral Oral  SpO2: 99% 99% 99% 100%  Weight:   63.3 kg   Height:       General: Elderly female, NAD,lying in bed. Cardiac:Regular rate and normal rhythm. No m/r/g noted. Pulmonary:clear to auscultation bilaterally. No wheezing, rhonchi, or ralesnoted. Abdomen:Soft and non-distended, with no rebound or guarding. Normoactive bowel sounds in all quadrants. Mild suprapubic discomfort with deep palpation. No CVA tenderness on exam. Neurological: AAOx3, no focal deficits noted.  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)   Chronic diastolic heart failure (HCC)   Hypertension associated with diabetes (HCC)   AKI (acute kidney injury) ([place])   PAF  (paroxysmal atrial fibrillation) (HCC)   Urinary tract infection   Patient is a 74 year old female with history of insulin-dependent DM w/neuropathy, HFpEF, HTN, paroxysmal A-fib on xarelto, Stage 3 CKD, gout, and hypothyroidism presenting with generalized weakness, urinary frequency, suprapubic tenderness, and L>R CVA tenderness concerning for acute pyelonephritis.Will treat withIV ceftriaxone while in the hospital and transition to PO cipro 500mg  daily once discharged for a total 7-day course of abx.  1.Acute pyelonephritis: Generalized weakness. UAshowed large Hgb, positive nitries, large leukocytes, and >50 WBC. (+) suprapubic tenderness and L>R CVA tenderness. Initially refused IV abx and thus started on Keflexin the ED.Agreeable to IV medications when we spoke with heron 7/11/21and so we switched her from PO Keflex to IV ceftriaxone. -On 12/10/19, urine cultures showed multiple species in urine. Will give on more dose of IV ceftriaxone and then transition to PO ciprofloxacin (if renal function is stable) for a total 7-day course of abx. Still unsure if patient has 24 hour care at home (tried calling daughter multiple times but unable to reach) so awaiting PT/SW decision as to home health vs LTC placement. -On 12/11/19, patient is clinically improving. Tried calling patient's daughter on the phone number that the patient gave me, but still unable to reach her. I did leave a voicemail. Otherwise, the patient is medically stable for discharge. -On 12/12/19, patient remains medically stable for discharge. Patient is adamantly refusing SNF placement. Tried calling daughter again but no answer still. Appreciate SW assistance with discharge. SW able to reach  daughter later in day, will discharge patient tomorrow as per daughter. -On 12/13/19, patient still medically stable for discharge. Set up f/u with urologist, Dr. Ronal Fear, at [facility] for issues with urinary incontinence. Set up  f[facility]u with [facility] for outpatient primary care (also to establish PCP). Will discharge home with home health services (RN, PT, Face-to-face). Spoke with patient's daughter about the aforementioned follow-up appointments.  -Urine culture--> multiple species present -BMP showing stable, but reduced renal function (CrCl 26) -Received day 5 of IVceftriaxone 1g q24h -Discharged with2-day course of PO ciprofloxacin 500mg daily(renally dosed due to reduced CrCl) to complete 7-day course of antibiotics. -Patient complained of having incontinence prior to admission. States she used to follow urologist for it. Nothing to do about it as inpatient. However, set up an outpatient appointment with urology for her to obtain further care. Bladder scan prior to discharge showed 338mL of urine in patient's bladder (obtained to rule out overflow incontinence).   2.AKI on Stage III CKD: Possibly not an AKI and simply a progression of her CKD, as her previous creatinine before current admission was also elevated at 2.2 compared to previous baseline of 1.2.  -At admission, creatinine 1.82 -On 12/09/19, creatinine 1.90---> creatinine 1.84 (12/10/19) -On 12/13/19, Cr 2.29 likely secondary to restarting HCTZ as she was tolerating IV ceftriaxone well. Discontinued HCTZ. Patient feels good, is able to intake PO fluids and food as needed, ready to go home. -Held metformin due to increased Cr. -BMP showing stable, but reduced renal function.  3. Deconditioning: Likely 2/2 pyelonephritis, will treat as above. -TSH 4.572 -PT recommends home PT  4.Sacral decubitus ulcer: Appreciate wound consult, follow recs.  5.HTN: BP stable in 140s - 150s/70s. -Resumed home metoprolol,diltiazem -DC/d HCTZ  6.Hypothyroidism: -TSH 4.572 -Continue home levothyroxine 75 mcg daily -f/u as outpatient  Prior to Admission Living Arrangement:Home Anticipated Discharge Location:Home with home health RN  and PT. Barriers to Discharge:None Dispo: Anticipated dischargetoday.   Virl Axe, MD 12/13/2019, 3:16 PM Pager: [phone] After 5pm on weekdays and 1pm on weekends: On Call pager [phone]

## 2019-12-13 NOTE — Care Management (Addendum)
  1420 discharge order in . Called [name] , she is at a MD appointment with her grand daughter and would like PTAR arranged for 1730. Same done , PTAR papers on chart.  [place] called back, her grand daughter has an appointment at 3pm. Will call [place] when , MD and bedside nurse asks NCM to call PTAR.   Spoke to patient's daughter [name] [phone] by phone. [name] aware her mother is to be discharged today. However, her grand daughter has a cough. [name] would like to call grand daughters MD to see if she can do a video visit for grand daughter. [name] will call NCM back.   [name] called back. Child's MD will do a video chat. [name] is at the home and ready for patient to be discharged.    Magdalen Spatz RN
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
## 2019-12-13 NOTE — Progress Notes (Signed)
[facility] called regarding discharge instruction. Endorsed to incoming shift RN

## 2019-12-14 ENCOUNTER — Telehealth: Payer: Self-pay | Admitting: Internal Medicine

## 2019-12-14 NOTE — Telephone Encounter (Signed)
-----   Message from Marty Heck, DO sent at 12/13/2019  1:40 PM EDT ----- Regarding: Hospital Follow-up Hello,  She needs follow-up and to establish care with the clinic. You may have to call her daughter to make the appointment. Thanks!

## 2019-12-14 NOTE — Telephone Encounter (Signed)
Unable to schedule HFU appointment.  Left voicemail message asking patient to please give the clinic a call back.

## 2019-12-14 NOTE — Progress Notes (Signed)
Pt discharged per PTAR.

## 2019-12-16 ENCOUNTER — Encounter (HOSPITAL_COMMUNITY): Payer: Self-pay | Admitting: *Deleted

## 2019-12-16 ENCOUNTER — Other Ambulatory Visit: Payer: Self-pay

## 2019-12-16 ENCOUNTER — Emergency Department (HOSPITAL_COMMUNITY)
Admission: EM | Admit: 2019-12-16 | Discharge: 2019-12-20 | Disposition: A | Discharge status: Home / Self Care | Payer: Medicare Other | Attending: Emergency Medicine | Admitting: Emergency Medicine

## 2019-12-16 ENCOUNTER — Emergency Department (HOSPITAL_COMMUNITY): Payer: Medicare Other

## 2019-12-16 DIAGNOSIS — R05 Cough: Secondary | ICD-10-CM | POA: Insufficient documentation

## 2019-12-16 DIAGNOSIS — Z794 Long term (current) use of insulin: Secondary | ICD-10-CM | POA: Insufficient documentation

## 2019-12-16 DIAGNOSIS — I480 Paroxysmal atrial fibrillation: Secondary | ICD-10-CM | POA: Diagnosis not present

## 2019-12-16 DIAGNOSIS — E1122 Type 2 diabetes mellitus with diabetic chronic kidney disease: Secondary | ICD-10-CM | POA: Insufficient documentation

## 2019-12-16 DIAGNOSIS — E1140 Type 2 diabetes mellitus with diabetic neuropathy, unspecified: Secondary | ICD-10-CM | POA: Insufficient documentation

## 2019-12-16 DIAGNOSIS — N189 Chronic kidney disease, unspecified: Secondary | ICD-10-CM | POA: Insufficient documentation

## 2019-12-16 DIAGNOSIS — Z87891 Personal history of nicotine dependence: Secondary | ICD-10-CM | POA: Insufficient documentation

## 2019-12-16 DIAGNOSIS — Z20822 Contact with and (suspected) exposure to covid-19: Secondary | ICD-10-CM | POA: Diagnosis not present

## 2019-12-16 DIAGNOSIS — L89159 Pressure ulcer of sacral region, unspecified stage: Secondary | ICD-10-CM

## 2019-12-16 DIAGNOSIS — R5383 Other fatigue: Secondary | ICD-10-CM | POA: Insufficient documentation

## 2019-12-16 DIAGNOSIS — I130 Hypertensive heart and chronic kidney disease with heart failure and stage 1 through stage 4 chronic kidney disease, or unspecified chronic kidney disease: Secondary | ICD-10-CM | POA: Insufficient documentation

## 2019-12-16 DIAGNOSIS — I5033 Acute on chronic diastolic (congestive) heart failure: Secondary | ICD-10-CM | POA: Diagnosis not present

## 2019-12-16 DIAGNOSIS — R531 Weakness: Secondary | ICD-10-CM | POA: Insufficient documentation

## 2019-12-16 DIAGNOSIS — R350 Frequency of micturition: Secondary | ICD-10-CM | POA: Diagnosis not present

## 2019-12-16 DIAGNOSIS — Z79890 Hormone replacement therapy (postmenopausal): Secondary | ICD-10-CM | POA: Insufficient documentation

## 2019-12-16 DIAGNOSIS — Z79899 Other long term (current) drug therapy: Secondary | ICD-10-CM | POA: Diagnosis not present

## 2019-12-16 DIAGNOSIS — R000 Tachycardia, unspecified: Secondary | ICD-10-CM

## 2019-12-16 LAB — URINALYSIS, ROUTINE W REFLEX MICROSCOPIC
Bilirubin Urine: NEGATIVE
Glucose, UA: NEGATIVE mg/dL
Ketones, ur: NEGATIVE mg/dL
Nitrite: NEGATIVE
Protein, ur: 100 mg/dL — AB
Specific Gravity, Urine: 1.014 (ref 1.005–1.030)
pH: 5 (ref 5.0–8.0)

## 2019-12-16 LAB — COMPREHENSIVE METABOLIC PANEL
ALT: 33 U/L (ref 0–44)
AST: 44 U/L — ABNORMAL HIGH (ref 15–41)
Albumin: 2.7 g/dL — ABNORMAL LOW (ref 3.5–5.0)
Alkaline Phosphatase: 222 U/L — ABNORMAL HIGH (ref 38–126)
Anion gap: 9 (ref 5–15)
BUN: 39 mg/dL — ABNORMAL HIGH (ref 8–23)
CO2: 24 mmol/L (ref 22–32)
Calcium: 9.1 mg/dL (ref 8.9–10.3)
Chloride: 110 mmol/L (ref 98–111)
Creatinine, Ser: 1.75 mg/dL — ABNORMAL HIGH (ref 0.44–1.00)
GFR calc Af Amer: 32 mL/min — ABNORMAL LOW (ref >60)
GFR calc non Af Amer: 28 mL/min — ABNORMAL LOW (ref >60)
Glucose, Bld: 274 mg/dL — ABNORMAL HIGH (ref 70–99)
Potassium: 4.1 mmol/L (ref 3.5–5.1)
Sodium: 143 mmol/L (ref 135–145)
Total Bilirubin: 0.8 mg/dL (ref 0.3–1.2)
Total Protein: 7.5 g/dL (ref 6.5–8.1)

## 2019-12-16 LAB — TROPONIN I (HIGH SENSITIVITY)
Troponin I (High Sensitivity): 17 ng/L (ref <18)
Troponin I (High Sensitivity): 19 ng/L — ABNORMAL HIGH (ref <18)

## 2019-12-16 LAB — CBC
HCT: 35.1 % — ABNORMAL LOW (ref 36.0–46.0)
Hemoglobin: 11.5 g/dL — ABNORMAL LOW (ref 12.0–15.0)
MCH: 29.3 pg (ref 26.0–34.0)
MCHC: 32.8 g/dL (ref 30.0–36.0)
MCV: 89.3 fL (ref 80.0–100.0)
Platelets: 358 10*3/uL (ref 150–400)
RBC: 3.93 MIL/uL (ref 3.87–5.11)
RDW: 15.6 % — ABNORMAL HIGH (ref 11.5–15.5)
WBC: 13.6 10*3/uL — ABNORMAL HIGH (ref 4.0–10.5)
nRBC: 0 % (ref 0.0–0.2)

## 2019-12-16 LAB — LACTIC ACID, PLASMA
Lactic Acid, Venous: 1.8 mmol/L (ref 0.5–1.9)
Lactic Acid, Venous: 1.5 mmol/L (ref 0.5–1.9)

## 2019-12-16 LAB — BRAIN NATRIURETIC PEPTIDE: B Natriuretic Peptide: 118.6 pg/mL — ABNORMAL HIGH (ref 0.0–100.0)

## 2019-12-16 LAB — CBG MONITORING, ED: Glucose-Capillary: 220 mg/dL — ABNORMAL HIGH (ref 70–99)

## 2019-12-16 MED ORDER — LACTATED RINGERS IV BOLUS
500.0000 mL | Freq: Once | INTRAVENOUS | Status: AC
  Administered 2019-12-16: 500 mL via INTRAVENOUS

## 2019-12-16 MED ORDER — DILTIAZEM HCL 90 MG PO TABS
90.0000 mg | ORAL_TABLET | Freq: Once | ORAL | Status: DC
  Filled 2019-12-16 (×2): qty 1

## 2019-12-16 MED ORDER — METOPROLOL TARTRATE 5 MG/5ML IV SOLN
5.0000 mg | Freq: Once | INTRAVENOUS | Status: AC
  Administered 2019-12-16: 5 mg via INTRAVENOUS
  Filled 2019-12-16: qty 5

## 2019-12-16 MED ORDER — DILTIAZEM HCL 25 MG/5ML IV SOLN
20.0000 mg | Freq: Once | INTRAVENOUS | Status: DC
  Filled 2019-12-16: qty 5

## 2019-12-16 NOTE — ED Provider Notes (Signed)
Care assumed from Dr. Alric Ran at shift change pending case management consult. See her note for full H&P.   Briefly is a 74 year old female presented to emergency room today with chief complaint of weakness. Daughter called EMS for weakness.  Daughter has been contacted and states she does not feel comfortable taking patient home as she is unable to provide the level of care patient requires. Daughter's phone number has been updated in the chart. Previous team placed consult to case management however given it is late on a weekend evening patient will likely have to board overnight for AM consult.  Patient is resting comfortably in bed and updated on plan. Overall work up without significant findings, no acute process or obvious infection. Delta troponin flat. Diabetic diet ordered. Home medications ordered.    Portions of this note were generated with Lobbyist. Dictation errors may occur despite best attempts at proofreading.    Cherre Robins, PA-C 12/17/19 0249    Ezequiel Essex, MD 12/17/19 [phone]

## 2019-12-16 NOTE — ED Triage Notes (Signed)
PT from home via EMS with complaint of weakness. Pr lives with daughter and had a recent admission for UTI. Pt has a Hx of CHF per EMS.  EMS BP 176/80, HR 140, Temp 97.7,
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
## 2019-12-16 NOTE — Social Work (Addendum)
CSW received a call from patient's daughter Britt Boozer stating that patient needed to be in a SNF. [name] stated that she was informed by the [facility] PT to call the hospital to assist with the admission. CSW informed [name] that once a patient has been discharged the facilitation of a placement would go through the PCP. [name] stated that patient does not have a PCP. CSW provided [name] with PCP number that was noted in the chart to call on Monday.Sarah Colon

## 2019-12-16 NOTE — ED Notes (Signed)
Pt had medium bowel movement. Pt cleaned and a new purewick and brief applied

## 2019-12-16 NOTE — ED Notes (Signed)
Diltiazem ordered from pharmacy

## 2019-12-16 NOTE — ED Notes (Signed)
Left message with case management

## 2019-12-17 DIAGNOSIS — R531 Weakness: Secondary | ICD-10-CM | POA: Diagnosis not present

## 2019-12-17 MED ORDER — PIOGLITAZONE HCL 15 MG PO TABS
15.0000 mg | ORAL_TABLET | Freq: Every day | ORAL | Status: DC
  Administered 2019-12-17 – 2019-12-20 (×4): 15 mg via ORAL
  Filled 2019-12-17 (×5): qty 1

## 2019-12-17 MED ORDER — ACETAMINOPHEN 325 MG PO TABS: 650.0000 mg | ORAL_TABLET | Freq: Four times a day (QID) | ORAL | Status: DC | PRN

## 2019-12-17 MED ORDER — FERROUS SULFATE 325 (65 FE) MG PO TABS
325.0000 mg | ORAL_TABLET | Freq: Every day | ORAL | Status: DC
  Administered 2019-12-17 – 2019-12-20 (×4): 325 mg via ORAL
  Filled 2019-12-17 (×4): qty 1

## 2019-12-17 MED ORDER — GUAIFENESIN 100 MG/5ML PO SOLN: 200.0000 mg | ORAL | Status: DC | PRN

## 2019-12-17 MED ORDER — GABAPENTIN 300 MG PO CAPS
600.0000 mg | ORAL_CAPSULE | Freq: Two times a day (BID) | ORAL | Status: DC
  Administered 2019-12-17 – 2019-12-18 (×3): 600 mg via ORAL
  Filled 2019-12-17 (×3): qty 2

## 2019-12-17 MED ORDER — PANTOPRAZOLE SODIUM 40 MG PO TBEC
40.0000 mg | DELAYED_RELEASE_TABLET | Freq: Every day | ORAL | Status: DC
  Administered 2019-12-17 – 2019-12-20 (×4): 40 mg via ORAL
  Filled 2019-12-17 (×4): qty 1

## 2019-12-17 MED ORDER — POLYETHYLENE GLYCOL 3350 17 G PO PACK: 17.0000 g | PACK | Freq: Every day | ORAL | Status: DC | PRN

## 2019-12-17 MED ORDER — GLUCAGON HCL (RDNA) 1 MG IJ SOLR: 1.0000 mg | INTRAMUSCULAR | Status: DC | PRN

## 2019-12-17 MED ORDER — CALCIUM CARBONATE ANTACID 500 MG PO CHEW: 2.0000 | CHEWABLE_TABLET | Freq: Three times a day (TID) | ORAL | Status: DC | PRN

## 2019-12-17 MED ORDER — DILTIAZEM HCL 90 MG PO TABS
180.0000 mg | ORAL_TABLET | Freq: Every day | ORAL | Status: DC
  Administered 2019-12-17 – 2019-12-18 (×2): 180 mg via ORAL
  Filled 2019-12-17 (×2): qty 2

## 2019-12-17 MED ORDER — RIVAROXABAN 15 MG PO TABS
15.0000 mg | ORAL_TABLET | Freq: Every day | ORAL | Status: DC
  Administered 2019-12-17 – 2019-12-19 (×3): 15 mg via ORAL
  Filled 2019-12-17 (×4): qty 1

## 2019-12-17 MED ORDER — LEVOTHYROXINE SODIUM 75 MCG PO TABS
75.0000 ug | ORAL_TABLET | Freq: Every day | ORAL | Status: DC
  Administered 2019-12-17 – 2019-12-20 (×4): 75 ug via ORAL
  Filled 2019-12-17 (×4): qty 1

## 2019-12-17 MED ORDER — TRAMADOL HCL 50 MG PO TABS: 50.0000 mg | ORAL_TABLET | Freq: Four times a day (QID) | ORAL | Status: DC | PRN

## 2019-12-17 MED ORDER — MELATONIN 3 MG PO TABS
1.5000 mg | ORAL_TABLET | Freq: Every day | ORAL | Status: DC
  Administered 2019-12-17 – 2019-12-18 (×2): 1.5 mg via ORAL
  Filled 2019-12-17 (×4): qty 0.5
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
## 2019-12-17 NOTE — Progress Notes (Addendum)
CSW met with Pt at bedside. Pt is A&Ox4. CSW discussed SNF placement with Pt who is reluctant due to past experiences with nursing facilities.  CSW was able to reach daughter [name] via phone @ [phone] and with daughter on speaker phone decision was made with Pt's consent to begin SNF placement process.   CSW sent Pt info and FL2 to several area SNFs for review 

## 2019-12-17 NOTE — Progress Notes (Addendum)
2:20pm CSW spoke with patient's daughter Britt Boozer who states [facility] came to her home on Saturday but that the [facility] PT recommended the patient admit to SNF. [name] reports the patient is unable to ambulate or perform ADL's on her own. [name] reports the patient currently lives with her and that she will not return home until Wednesday from being out of town.   10:20am: CSW spoke with Chantell at [name] who states this patient was a long term resident at the facility up until July 9th, 2021. Chantell states the patient's daughter removed her from the facility and that the patient cannot return due to an unfulfilled financial obligation.  CSW attempted to reach patient's daughter [name] without success - a voicemail was left requesting a return call.  Madilyn Fireman, MSW, LCSW-A Transitions of Care  Clinical Social Worker  [facility] Emergency Departments  Medical ICU [phone]
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
## 2019-12-17 NOTE — Progress Notes (Signed)
Inpatient Diabetes Program Recommendations  AACE/ADA: New Consensus Statement on Inpatient Glycemic Control (2015)  Target Ranges:  Prepandial:   less than 140 mg/dL      Peak postprandial:   less than 180 mg/dL (1-2 hours)      Critically ill patients:  140 - 180 mg/dL   Lab Results  Component Value Date   GLUCAP 220 (H) 12/16/2019   HGBA1C 7.3 (H) 12/09/2019    Review of Glycemic Control  Diabetes history: DM 2 Outpatient Diabetes medications: Lantus 20 units, Januvia 100 mg Daily, Actos 15 mg Daily Current orders for Inpatient glycemic control:  Actos 15 mg Daily  Inpatient Diabetes Program Recommendations:    - CBG checks achs - Add Lantus 20 units - Add Tradjenta 5 mg Daily in place of Januvia pt takes at home.  (pt on lantus last time she was in the hospital recently)  Thanks,  Tama Headings RN, MSN, BC-ADM Inpatient Diabetes Coordinator Team Pager [phone] (8a-5p)

## 2019-12-17 NOTE — ED Notes (Signed)
Lunch Trays Ordered @ 1101. 

## 2019-12-17 NOTE — ED Notes (Signed)
Breakfast Ordered 

## 2019-12-18 DIAGNOSIS — R531 Weakness: Secondary | ICD-10-CM | POA: Diagnosis not present

## 2019-12-18 LAB — CBC
HCT: 30.7 % — ABNORMAL LOW (ref 36.0–46.0)
Hemoglobin: 10.1 g/dL — ABNORMAL LOW (ref 12.0–15.0)
MCH: 29.1 pg (ref 26.0–34.0)
MCHC: 32.9 g/dL (ref 30.0–36.0)
MCV: 88.5 fL (ref 80.0–100.0)
Platelets: 303 10*3/uL (ref 150–400)
RBC: 3.47 MIL/uL — ABNORMAL LOW (ref 3.87–5.11)
RDW: 15 % (ref 11.5–15.5)
WBC: 13.5 10*3/uL — ABNORMAL HIGH (ref 4.0–10.5)
nRBC: 0 % (ref 0.0–0.2)

## 2019-12-18 LAB — BASIC METABOLIC PANEL
Anion gap: 10 (ref 5–15)
BUN: 43 mg/dL — ABNORMAL HIGH (ref 8–23)
CO2: 20 mmol/L — ABNORMAL LOW (ref 22–32)
Calcium: 8.4 mg/dL — ABNORMAL LOW (ref 8.9–10.3)
Chloride: 107 mmol/L (ref 98–111)
Creatinine, Ser: 1.86 mg/dL — ABNORMAL HIGH (ref 0.44–1.00)
GFR calc Af Amer: 30 mL/min — ABNORMAL LOW (ref >60)
GFR calc non Af Amer: 26 mL/min — ABNORMAL LOW (ref >60)
Glucose, Bld: 248 mg/dL — ABNORMAL HIGH (ref 70–99)
Potassium: 4.3 mmol/L (ref 3.5–5.1)
Sodium: 137 mmol/L (ref 135–145)

## 2019-12-18 LAB — URINE CULTURE

## 2019-12-18 LAB — CBG MONITORING, ED
Glucose-Capillary: 150 mg/dL — ABNORMAL HIGH (ref 70–99)
Glucose-Capillary: 221 mg/dL — ABNORMAL HIGH (ref 70–99)

## 2019-12-18 LAB — SARS CORONAVIRUS 2 BY RT PCR (HOSPITAL ORDER, PERFORMED IN ~~LOC~~ HOSPITAL LAB): SARS Coronavirus 2: NEGATIVE

## 2019-12-18 MED ORDER — DILTIAZEM HCL ER COATED BEADS 180 MG PO CP24
180.0000 mg | ORAL_CAPSULE | Freq: Every day | ORAL | Status: DC
  Administered 2019-12-18 – 2019-12-20 (×3): 180 mg via ORAL
  Filled 2019-12-18 (×3): qty 1

## 2019-12-18 MED ORDER — METOPROLOL TARTRATE 25 MG PO TABS
25.0000 mg | ORAL_TABLET | Freq: Two times a day (BID) | ORAL | Status: DC
  Administered 2019-12-18 – 2019-12-20 (×4): 25 mg via ORAL
  Filled 2019-12-18 (×4): qty 1

## 2019-12-18 MED ORDER — INSULIN ASPART 100 UNIT/ML ~~LOC~~ SOLN
0.0000 [IU] | Freq: Three times a day (TID) | SUBCUTANEOUS | Status: DC
  Administered 2019-12-18: 2 [IU] via SUBCUTANEOUS
  Administered 2019-12-19: 3 [IU] via SUBCUTANEOUS
  Administered 2019-12-19: 8 [IU] via SUBCUTANEOUS
  Administered 2019-12-19 – 2019-12-20 (×3): 3 [IU] via SUBCUTANEOUS

## 2019-12-18 MED ORDER — GABAPENTIN 300 MG PO CAPS: 600.0000 mg | ORAL_CAPSULE | Freq: Every day | ORAL | Status: DC

## 2019-12-18 MED ORDER — TAMSULOSIN HCL 0.4 MG PO CAPS
0.4000 mg | ORAL_CAPSULE | Freq: Every day | ORAL | Status: DC
  Administered 2019-12-18 – 2019-12-20 (×3): 0.4 mg via ORAL
  Filled 2019-12-18 (×2): qty 1

## 2019-12-18 MED ORDER — ACETAMINOPHEN 325 MG PO TABS
650.0000 mg | ORAL_TABLET | Freq: Once | ORAL | Status: DC
  Filled 2019-12-18: qty 2

## 2019-12-18 MED ORDER — DILTIAZEM HCL 25 MG/5ML IV SOLN
10.0000 mg | Freq: Once | INTRAVENOUS | Status: AC
  Administered 2019-12-18: 10 mg via INTRAVENOUS
  Filled 2019-12-18: qty 5

## 2019-12-18 MED ORDER — DILTIAZEM HCL ER COATED BEADS 180 MG PO CP24: 180.0000 mg | ORAL_CAPSULE | Freq: Every day | ORAL | Status: DC
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
## 2019-12-18 NOTE — Progress Notes (Addendum)
1:30pm: CSW spoke with Sophia, PA to request patient receive a COVID test prior to discharge to the facility. Due to insurance authorization still pending, this patient will likely not discharge today.  9am: CSW spoke with patient's daughter Britt Boozer to present her with bed offers from [facility], [facility], and [facility]. Rivera Colon chose [facility].  CSW notified Juliann Pulse at [facility] of acceptance.   CSW will initiate insurance authorization from [facility].  Madilyn Fireman, MSW, LCSW-A Transitions of Care  Clinical Social Worker  [facility] Emergency Departments  Medical ICU [phone]

## 2019-12-18 NOTE — ED Notes (Signed)
Lunch Tray Ordered @ 1045 

## 2019-12-18 NOTE — ED Provider Notes (Signed)
  Physical Exam  BP (!) 167/81   Pulse (!) 130   Temp 98.3 F (36.8 C) (Oral)   Resp 20   SpO2 100%   Physical Exam  Gen: appears nontoxic CV: tachycardic Pulm: CTAB  ED Course/Procedures     Procedures  MDM    Patient boarding in the ER until she can be placed with social work.  Per Education officer, museum notes, they are waiting on insurance approval.  On my evaluation, patient reports no complaints.  No chest pain, shortness of breath, or fatigue.  She is found to be tachycardic in the 130s, however is just now receiving her diltiazem.  Will monitor for heart rate improvement after the medication.   After oral diltiazem, patient's heart rate still remains in sinus tach around 1 20-1 30.  Will give an IV dose of diltiazem and reassess.  On reassessment, patient remains tachycardic.  She does have more frequent PACs.  Case discussed with attending, Dr. Stark Jock agrees to plan.  Considering patient's age, generalized weakness, and tachycardia, she will likely need hospitalization for stabilization prior to being discharged to a SNF facility.  Discussed with internal medicine teaching service.  They report patient's dilt order is wrong (fast acting, not XR) and that pt is not ordered home metoprolol (had a dose yesterday in the ED). Will change dilt order and give metoprolol. IMTS to consult.   On reacheck, HR is improved. As this was likely just a med error, do not feel pt needs admission, and IMTS agrees. Will continue with plan for SNF placement.     Franchot Heidelberg, PA-C 12/18/19 1324    Veryl Speak, MD 12/18/19 1334

## 2019-12-18 NOTE — Progress Notes (Signed)
  4:45 CSW met with Pt at bedside to discuss insurance issue. CSW and Pt called daughter, Britt Boozer @ [phone] to inform her about the insurance issue.  Daughter is still in [place] and will not be returning until tomorrow. Due to no one being at home, discharge would be unsafe at this time.  CSW emailed daughter  _0 _1  to give her insurance information. Daughter is hoping to be able to change insurance/appeal.  CSW will continue to follow for d/c needs.    4:14  CSW received call from [place] [facility] her that Pt's insurance is specific to nursing home care and will not cover SNF placement.

## 2019-12-18 NOTE — ED Notes (Signed)
Pt had a BM. 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 Pt was cleaned and wound was cleaned and new mepilex dressing applied.

## 2019-12-19 DIAGNOSIS — R531 Weakness: Secondary | ICD-10-CM | POA: Diagnosis not present

## 2019-12-19 LAB — CBG MONITORING, ED
Glucose-Capillary: 197 mg/dL — ABNORMAL HIGH (ref 70–99)
Glucose-Capillary: 294 mg/dL — ABNORMAL HIGH (ref 70–99)
Glucose-Capillary: 199 mg/dL — ABNORMAL HIGH (ref 70–99)
Glucose-Capillary: 214 mg/dL — ABNORMAL HIGH (ref 70–99)

## 2019-12-19 NOTE — Progress Notes (Addendum)
10:45am: CSW received return call form patient's daughter Sarah Colon who states their home has been deemed unlivable by the [place] due to poor living conditions.   Sarah Colon reports she spoke with [facility] who states the patient's insurance coverage will revert back to [place] Medicare with short term SNF benefits on 12/30/2019.  8:45am: CSW spoke with Claiborne Billings at [employer] who clarified the limitations to the patient's insurance - patient was previously at [facility] long term and her plan was switched to a long term nursing home [employer] plan. Claiborne Billings states that [facility] does not have long term beds at this time.  CSW spoke with patient's daughter Sarah Colon who states she is contacting [facility] now to have her [employer] plan switched back. Sarah Colon to return call to CSW once action is completed.  Madilyn Fireman, MSW, LCSW-A Transitions of Care  Clinical Social Worker  [facility] Emergency Departments  Medical ICU [phone]

## 2019-12-19 NOTE — ED Notes (Signed)
Sarah Colon, Sarah Colon 12/19/19

## 2019-12-19 NOTE — ED Notes (Signed)
PT ate grits

## 2019-12-19 NOTE — ED Notes (Signed)
Patient is resting comfortably. Pt was cleaned up from a bowel movement and new linen reapplied as well as a new purewik.

## 2019-12-19 NOTE — ED Notes (Signed)
Pt sitting up eating breakfast.

## 2019-12-19 NOTE — Progress Notes (Signed)
CSW spoke with daughter via phone. Daughter [name] states home in [place] has been deemed unlivable by city. [name] states that she will call back in the morning with a plan for Pt discharge after speaking with insurance agency.  CSW staffed case with [facility] Supervisor Olga Coaster.

## 2019-12-19 NOTE — ED Notes (Signed)
Lunch Tray Ordered @ 1023. 

## 2019-12-19 NOTE — ED Notes (Signed)
p received grits as she asked for.

## 2019-12-19 NOTE — ED Notes (Signed)
Pt is resting comfortably, no signs of distress at this time, will continue to monitor.

## 2019-12-20 DIAGNOSIS — R531 Weakness: Secondary | ICD-10-CM | POA: Diagnosis not present

## 2019-12-20 LAB — CBG MONITORING, ED
Glucose-Capillary: 173 mg/dL — ABNORMAL HIGH (ref 70–99)
Glucose-Capillary: 168 mg/dL — ABNORMAL HIGH (ref 70–99)
Glucose-Capillary: 218 mg/dL — ABNORMAL HIGH (ref 70–99)

## 2019-12-20 NOTE — ED Notes (Signed)
Pt had a large Bm in her brief, brief and gown changed, pt cleaned

## 2019-12-20 NOTE — Progress Notes (Signed)
Inpatient Diabetes Program Recommendations  AACE/ADA: New Consensus Statement on Inpatient Glycemic Control (2015)  Target Ranges:  Prepandial:   less than 140 mg/dL      Peak postprandial:   less than 180 mg/dL (1-2 hours)      Critically ill patients:  140 - 180 mg/dL   Lab Results  Component Value Date   GLUCAP 214 (H) 12/19/2019   HGBA1C 7.3 (H) 12/09/2019    Review of Glycemic Control  Diabetes history: DM 2 Outpatient Diabetes medications: Lantus 20 units, Januvia 100 mg Daily, Actos 15 mg Daily Current orders for Inpatient glycemic control:  Actos 15 mg Daily Novolog 0-15 units tid  Inpatient Diabetes Program Recommendations:    - Add Lantus 10 units   Thanks,  Tama Headings RN, MSN, BC-ADM Inpatient Diabetes Coordinator Team Pager [phone] (8a-5p)

## 2019-12-20 NOTE — ED Notes (Signed)
Breakfast Ordered--Sarah Colon  
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
## 2019-12-20 NOTE — Discharge Instructions (Addendum)
It was our pleasure to provide your ER care today - we hope that you feel better.  Home Health services have been arranged on your behalf through [facility]. Please allow 24 hours after discharge before a [facility] staff member contacts you to initiate services.   Keep pressure off back/sacral and heel areas as much as possible. When sitting or in bed, turn frequently, and ensure adequate padding of those areas. Keep any wounds very clean.   Follow up with primary care doctor in the next 1-2 weeks.   Return to ER if worse, new symptoms, fevers, new or severe pain, trouble breathing, infection of wound, or other concern.

## 2019-12-20 NOTE — Progress Notes (Addendum)
11:55am: The patient will discharge via [facility] to [address], [place], [place] [address].  10:45am: TOC RN CM Mariann Laster placed referral for patient for [facility] services through [facility]. Referral was accepted.  10am: CSW spoke with [name] who expressed desires for the patient to return home. [name] states the home she is currently living in states the home is currently being renovated to get back up to [employer]. [name] reports there is one working bathroom, air conditioning, and a food supply in the home. [name] reports she will accept the patient back with home health services until she is able to get her placed once the insurance issues are resolved on August 1st.   [name] requesting until 12 noon to clean the home and prepare for her mother's return. CSW will attempt to obtain [facility] services for the patient.  8:30am: CSW spoke with patient's daughter Britt Boozer who is requesting additional time to make a decision on the discharge plan. CSW emphasized the importance of a decision being made immediately due to the patient being in the ED. [name] to contact the city of [place] and will return call to [place].  Madilyn Fireman, MSW, LCSW-A Transitions of Care  Clinical Social Worker  [facility] Emergency Departments  Medical ICU [phone]

## 2019-12-20 NOTE — ED Notes (Signed)
Pt brief soiled with urine and stool. Pt cleaned, gown changed, new purewick and pt transferred to hospital bed for comfort

## 2019-12-20 NOTE — ED Provider Notes (Signed)
RN/SW indicate patient needs home health face to face order placed, and that patient is ready for d/c - plan is that daughter is readying her home, and pt/fam/sw are working on various insurance issues as relates possible future ecg placement, and with additional home health services they feel home w daughter is best option. Pt is agreeable w plan, indicating she does not want to go to SNF/ECF. Pt currently denies any c/o, states she feels fine, no pain, no fever or chills. Pt is eating and drinking. Currently, bp is 132/68, hr 94, rr 18. Pt appears stable for d/c. Return precautions provided.      Lajean Saver, MD 12/20/19 1128

## 2019-12-23 ENCOUNTER — Emergency Department (HOSPITAL_COMMUNITY)
Admission: EM | Admit: 2019-12-23 | Discharge: 2019-12-24 | Disposition: A | Discharge status: Home / Self Care | Payer: Medicare Other | Attending: Emergency Medicine | Admitting: Emergency Medicine

## 2019-12-23 ENCOUNTER — Emergency Department (HOSPITAL_COMMUNITY): Payer: Medicare Other

## 2019-12-23 ENCOUNTER — Encounter (HOSPITAL_COMMUNITY): Payer: Self-pay

## 2019-12-23 ENCOUNTER — Other Ambulatory Visit: Payer: Self-pay

## 2019-12-23 DIAGNOSIS — Z20822 Contact with and (suspected) exposure to covid-19: Secondary | ICD-10-CM | POA: Insufficient documentation

## 2019-12-23 DIAGNOSIS — N189 Chronic kidney disease, unspecified: Secondary | ICD-10-CM | POA: Diagnosis not present

## 2019-12-23 DIAGNOSIS — E1122 Type 2 diabetes mellitus with diabetic chronic kidney disease: Secondary | ICD-10-CM | POA: Diagnosis not present

## 2019-12-23 DIAGNOSIS — Z794 Long term (current) use of insulin: Secondary | ICD-10-CM | POA: Insufficient documentation

## 2019-12-23 DIAGNOSIS — I5032 Chronic diastolic (congestive) heart failure: Secondary | ICD-10-CM | POA: Insufficient documentation

## 2019-12-23 DIAGNOSIS — R197 Diarrhea, unspecified: Secondary | ICD-10-CM | POA: Insufficient documentation

## 2019-12-23 DIAGNOSIS — L988 Other specified disorders of the skin and subcutaneous tissue: Secondary | ICD-10-CM | POA: Insufficient documentation

## 2019-12-23 DIAGNOSIS — R531 Weakness: Secondary | ICD-10-CM | POA: Diagnosis present

## 2019-12-23 DIAGNOSIS — E1140 Type 2 diabetes mellitus with diabetic neuropathy, unspecified: Secondary | ICD-10-CM | POA: Insufficient documentation

## 2019-12-23 DIAGNOSIS — Z79899 Other long term (current) drug therapy: Secondary | ICD-10-CM | POA: Insufficient documentation

## 2019-12-23 DIAGNOSIS — I130 Hypertensive heart and chronic kidney disease with heart failure and stage 1 through stage 4 chronic kidney disease, or unspecified chronic kidney disease: Secondary | ICD-10-CM | POA: Insufficient documentation

## 2019-12-23 DIAGNOSIS — Z87891 Personal history of nicotine dependence: Secondary | ICD-10-CM | POA: Diagnosis not present

## 2019-12-23 LAB — CBC WITH DIFFERENTIAL/PLATELET
Abs Immature Granulocytes: 0.05 10*3/uL (ref 0.00–0.07)
Basophils Absolute: 0.1 10*3/uL (ref 0.0–0.1)
Basophils Relative: 0 %
Eosinophils Absolute: 0.6 10*3/uL — ABNORMAL HIGH (ref 0.0–0.5)
Eosinophils Relative: 4 %
HCT: 31.4 % — ABNORMAL LOW (ref 36.0–46.0)
Hemoglobin: 10.3 g/dL — ABNORMAL LOW (ref 12.0–15.0)
Immature Granulocytes: 0 %
Lymphocytes Relative: 27 %
Lymphs Abs: 3.9 10*3/uL (ref 0.7–4.0)
MCH: 29.6 pg (ref 26.0–34.0)
MCHC: 32.8 g/dL (ref 30.0–36.0)
MCV: 90.2 fL (ref 80.0–100.0)
Monocytes Absolute: 1.1 10*3/uL — ABNORMAL HIGH (ref 0.1–1.0)
Monocytes Relative: 8 %
Neutro Abs: 9 10*3/uL — ABNORMAL HIGH (ref 1.7–7.7)
Neutrophils Relative %: 61 %
Platelets: 370 10*3/uL (ref 150–400)
RBC: 3.48 MIL/uL — ABNORMAL LOW (ref 3.87–5.11)
RDW: 15.4 % (ref 11.5–15.5)
WBC: 14.7 10*3/uL — ABNORMAL HIGH (ref 4.0–10.5)
nRBC: 0 % (ref 0.0–0.2)

## 2019-12-23 LAB — COMPREHENSIVE METABOLIC PANEL
ALT: 27 U/L (ref 0–44)
AST: 40 U/L (ref 15–41)
Albumin: 2.8 g/dL — ABNORMAL LOW (ref 3.5–5.0)
Alkaline Phosphatase: 200 U/L — ABNORMAL HIGH (ref 38–126)
Anion gap: 9 (ref 5–15)
BUN: 46 mg/dL — ABNORMAL HIGH (ref 8–23)
CO2: 22 mmol/L (ref 22–32)
Calcium: 8.9 mg/dL (ref 8.9–10.3)
Chloride: 112 mmol/L — ABNORMAL HIGH (ref 98–111)
Creatinine, Ser: 1.58 mg/dL — ABNORMAL HIGH (ref 0.44–1.00)
GFR calc Af Amer: 36 mL/min — ABNORMAL LOW (ref >60)
GFR calc non Af Amer: 31 mL/min — ABNORMAL LOW (ref >60)
Glucose, Bld: 196 mg/dL — ABNORMAL HIGH (ref 70–99)
Potassium: 4.2 mmol/L (ref 3.5–5.1)
Sodium: 143 mmol/L (ref 135–145)
Total Bilirubin: 0.5 mg/dL (ref 0.3–1.2)
Total Protein: 7.2 g/dL (ref 6.5–8.1)

## 2019-12-23 LAB — URINALYSIS, ROUTINE W REFLEX MICROSCOPIC
Bacteria, UA: NONE SEEN
Bilirubin Urine: NEGATIVE
Glucose, UA: NEGATIVE mg/dL
Hgb urine dipstick: NEGATIVE
Ketones, ur: NEGATIVE mg/dL
Leukocytes,Ua: NEGATIVE
Nitrite: NEGATIVE
Protein, ur: 100 mg/dL — AB
Specific Gravity, Urine: 1.011 (ref 1.005–1.030)
pH: 5 (ref 5.0–8.0)

## 2019-12-23 LAB — SARS CORONAVIRUS 2 BY RT PCR (HOSPITAL ORDER, PERFORMED IN ~~LOC~~ HOSPITAL LAB): SARS Coronavirus 2: NEGATIVE

## 2019-12-23 LAB — LACTIC ACID, PLASMA: Lactic Acid, Venous: 1.6 mmol/L (ref 0.5–1.9)

## 2019-12-23 MED ORDER — ONDANSETRON HCL 4 MG/2ML IJ SOLN: 4.0000 mg | Freq: Once | INTRAMUSCULAR | Status: DC

## 2019-12-23 MED ORDER — METOPROLOL TARTRATE 25 MG PO TABS
25.0000 mg | ORAL_TABLET | Freq: Once | ORAL | Status: AC
  Administered 2019-12-23: 25 mg via ORAL
  Filled 2019-12-23: qty 1

## 2019-12-23 MED ORDER — METOPROLOL TARTRATE 25 MG PO TABS: 25.0000 mg | ORAL_TABLET | Freq: Two times a day (BID) | ORAL | 0 refills | Status: AC

## 2019-12-23 NOTE — ED Triage Notes (Signed)
She was recently released from [facility] with dx of u.t.i. her daughter and home health aide were concerned about pt. Weakness. Pt. Arrives here awake, alert and in no distress.
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
## 2019-12-23 NOTE — Discharge Instructions (Addendum)
Make sure you are taking all your medications as prescribed, including the metoprolol.  I included a prescription for the metoprolol so you can be taking this. I have asked our social work team to reach out to y'all. You may follow-up with your primary care doctor as well to help arrange for long-term care. Continue using the home health services while waiting for long-term care. Return to the emergency room with any new, worsening, concerning symptoms.

## 2019-12-24 LAB — URINE CULTURE: Culture: <10000 — AB

## 2019-12-25 ENCOUNTER — Emergency Department (HOSPITAL_COMMUNITY)
Admission: EM | Admit: 2019-12-25 | Discharge: 2019-12-26 | Disposition: A | Discharge status: Home / Self Care | Payer: Medicare Other | Attending: Emergency Medicine | Admitting: Emergency Medicine

## 2019-12-25 ENCOUNTER — Encounter (HOSPITAL_COMMUNITY): Payer: Self-pay | Admitting: Emergency Medicine

## 2019-12-25 ENCOUNTER — Emergency Department (HOSPITAL_COMMUNITY): Payer: Medicare Other

## 2019-12-25 DIAGNOSIS — R1032 Left lower quadrant pain: Secondary | ICD-10-CM | POA: Insufficient documentation

## 2019-12-25 DIAGNOSIS — N189 Chronic kidney disease, unspecified: Secondary | ICD-10-CM | POA: Insufficient documentation

## 2019-12-25 DIAGNOSIS — E039 Hypothyroidism, unspecified: Secondary | ICD-10-CM | POA: Insufficient documentation

## 2019-12-25 DIAGNOSIS — L89152 Pressure ulcer of sacral region, stage 2: Secondary | ICD-10-CM | POA: Diagnosis not present

## 2019-12-25 DIAGNOSIS — I130 Hypertensive heart and chronic kidney disease with heart failure and stage 1 through stage 4 chronic kidney disease, or unspecified chronic kidney disease: Secondary | ICD-10-CM | POA: Insufficient documentation

## 2019-12-25 DIAGNOSIS — Z79899 Other long term (current) drug therapy: Secondary | ICD-10-CM | POA: Diagnosis not present

## 2019-12-25 DIAGNOSIS — F17210 Nicotine dependence, cigarettes, uncomplicated: Secondary | ICD-10-CM | POA: Diagnosis not present

## 2019-12-25 DIAGNOSIS — R531 Weakness: Secondary | ICD-10-CM | POA: Diagnosis not present

## 2019-12-25 DIAGNOSIS — Z794 Long term (current) use of insulin: Secondary | ICD-10-CM | POA: Diagnosis not present

## 2019-12-25 DIAGNOSIS — I5033 Acute on chronic diastolic (congestive) heart failure: Secondary | ICD-10-CM | POA: Insufficient documentation

## 2019-12-25 LAB — COMPREHENSIVE METABOLIC PANEL
ALT: 23 U/L (ref 0–44)
AST: 35 U/L (ref 15–41)
Albumin: 3 g/dL — ABNORMAL LOW (ref 3.5–5.0)
Alkaline Phosphatase: 188 U/L — ABNORMAL HIGH (ref 38–126)
Anion gap: 11 (ref 5–15)
BUN: 32 mg/dL — ABNORMAL HIGH (ref 8–23)
CO2: 23 mmol/L (ref 22–32)
Calcium: 8.8 mg/dL — ABNORMAL LOW (ref 8.9–10.3)
Chloride: 112 mmol/L — ABNORMAL HIGH (ref 98–111)
Creatinine, Ser: 1.47 mg/dL — ABNORMAL HIGH (ref 0.44–1.00)
GFR calc Af Amer: 40 mL/min — ABNORMAL LOW (ref >60)
GFR calc non Af Amer: 34 mL/min — ABNORMAL LOW (ref >60)
Glucose, Bld: 129 mg/dL — ABNORMAL HIGH (ref 70–99)
Potassium: 4 mmol/L (ref 3.5–5.1)
Sodium: 146 mmol/L — ABNORMAL HIGH (ref 135–145)
Total Bilirubin: 0.7 mg/dL (ref 0.3–1.2)
Total Protein: 7.4 g/dL (ref 6.5–8.1)

## 2019-12-25 LAB — CBC WITH DIFFERENTIAL/PLATELET
Abs Immature Granulocytes: 0.03 10*3/uL (ref 0.00–0.07)
Basophils Absolute: 0 10*3/uL (ref 0.0–0.1)
Basophils Relative: 0 %
Eosinophils Absolute: 0.6 10*3/uL — ABNORMAL HIGH (ref 0.0–0.5)
Eosinophils Relative: 5 %
HCT: 32.8 % — ABNORMAL LOW (ref 36.0–46.0)
Hemoglobin: 11 g/dL — ABNORMAL LOW (ref 12.0–15.0)
Immature Granulocytes: 0 %
Lymphocytes Relative: 27 %
Lymphs Abs: 3.1 10*3/uL (ref 0.7–4.0)
MCH: 30 pg (ref 26.0–34.0)
MCHC: 33.5 g/dL (ref 30.0–36.0)
MCV: 89.4 fL (ref 80.0–100.0)
Monocytes Absolute: 0.8 10*3/uL (ref 0.1–1.0)
Monocytes Relative: 7 %
Neutro Abs: 7.2 10*3/uL (ref 1.7–7.7)
Neutrophils Relative %: 61 %
Platelets: 424 10*3/uL — ABNORMAL HIGH (ref 150–400)
RBC: 3.67 MIL/uL — ABNORMAL LOW (ref 3.87–5.11)
RDW: 15.5 % (ref 11.5–15.5)
WBC: 11.8 10*3/uL — ABNORMAL HIGH (ref 4.0–10.5)
nRBC: 0 % (ref 0.0–0.2)

## 2019-12-25 LAB — URINALYSIS, ROUTINE W REFLEX MICROSCOPIC
Bacteria, UA: NONE SEEN
Bilirubin Urine: NEGATIVE
Glucose, UA: NEGATIVE mg/dL
Hgb urine dipstick: NEGATIVE
Ketones, ur: NEGATIVE mg/dL
Nitrite: NEGATIVE
Protein, ur: >=300 mg/dL — AB
Specific Gravity, Urine: 1.01 (ref 1.005–1.030)
pH: 5 (ref 5.0–8.0)

## 2019-12-25 LAB — CK: Total CK: 51 U/L (ref 38–234)

## 2019-12-25 LAB — TROPONIN I (HIGH SENSITIVITY)
Troponin I (High Sensitivity): 11 ng/L (ref <18)
Troponin I (High Sensitivity): 11 ng/L (ref <18)

## 2019-12-25 LAB — D-DIMER, QUANTITATIVE: D-Dimer, Quant: 1.21 ug/mL-FEU — ABNORMAL HIGH (ref 0.00–0.50)

## 2019-12-25 MED ORDER — DILTIAZEM HCL ER COATED BEADS 180 MG PO CP24
180.0000 mg | ORAL_CAPSULE | Freq: Once | ORAL | Status: AC
  Administered 2019-12-25: 180 mg via ORAL
  Filled 2019-12-25: qty 1

## 2019-12-25 MED ORDER — IOHEXOL 350 MG/ML SOLN
75.0000 mL | Freq: Once | INTRAVENOUS | Status: AC | PRN
  Administered 2019-12-25: 75 mL via INTRAVENOUS

## 2019-12-25 MED ORDER — METOPROLOL TARTRATE 25 MG PO TABS
25.0000 mg | ORAL_TABLET | Freq: Two times a day (BID) | ORAL | Status: DC
  Administered 2019-12-25 – 2019-12-26 (×2): 25 mg via ORAL
  Filled 2019-12-25 (×2): qty 1

## 2019-12-25 MED ORDER — SODIUM CHLORIDE 0.9 % IV BOLUS
1000.0000 mL | Freq: Once | INTRAVENOUS | Status: AC
  Administered 2019-12-25: 1000 mL via INTRAVENOUS

## 2019-12-25 MED ORDER — TRAMADOL HCL 50 MG PO TABS
50.0000 mg | ORAL_TABLET | Freq: Four times a day (QID) | ORAL | Status: DC | PRN
  Filled 2019-12-25 (×2): qty 1

## 2019-12-25 MED ORDER — PIOGLITAZONE HCL 15 MG PO TABS
15.0000 mg | ORAL_TABLET | Freq: Every day | ORAL | Status: DC
  Administered 2019-12-26: 15 mg via ORAL
  Filled 2019-12-25: qty 1

## 2019-12-25 MED ORDER — RIVAROXABAN 15 MG PO TABS
15.0000 mg | ORAL_TABLET | Freq: Every day | ORAL | Status: DC
  Administered 2019-12-25 – 2019-12-26 (×2): 15 mg via ORAL
  Filled 2019-12-25 (×2): qty 1

## 2019-12-25 MED ORDER — ACETAMINOPHEN 325 MG PO TABS: 650.0000 mg | ORAL_TABLET | Freq: Four times a day (QID) | ORAL | Status: DC | PRN

## 2019-12-25 MED ORDER — METOPROLOL TARTRATE 25 MG PO TABS
25.0000 mg | ORAL_TABLET | Freq: Once | ORAL | Status: AC
  Administered 2019-12-25: 25 mg via ORAL
  Filled 2019-12-25: qty 1

## 2019-12-25 MED ORDER — DILTIAZEM HCL ER 180 MG PO CP24
180.0000 mg | ORAL_CAPSULE | Freq: Every day | ORAL | Status: DC
  Administered 2019-12-26: 180 mg via ORAL
  Filled 2019-12-25 (×2): qty 1

## 2019-12-25 MED ORDER — METOPROLOL TARTRATE 25 MG PO TABS
ORAL_TABLET | ORAL | Status: AC
  Filled 2019-12-25: qty 1

## 2019-12-25 MED ORDER — GABAPENTIN 300 MG PO CAPS
600.0000 mg | ORAL_CAPSULE | Freq: Two times a day (BID) | ORAL | Status: DC
  Administered 2019-12-25 – 2019-12-26 (×2): 600 mg via ORAL
  Filled 2019-12-25 (×2): qty 2

## 2019-12-25 MED ORDER — FLUCONAZOLE 150 MG PO TABS
150.0000 mg | ORAL_TABLET | Freq: Once | ORAL | Status: AC
  Administered 2019-12-25: 150 mg via ORAL
  Filled 2019-12-25: qty 1

## 2019-12-25 MED ORDER — PANTOPRAZOLE SODIUM 40 MG PO TBEC
40.0000 mg | DELAYED_RELEASE_TABLET | Freq: Every day | ORAL | Status: DC
  Administered 2019-12-25 – 2019-12-26 (×2): 40 mg via ORAL
  Filled 2019-12-25 (×2): qty 1

## 2019-12-25 MED ORDER — POLYETHYLENE GLYCOL 3350 17 G PO PACK
17.0000 g | PACK | Freq: Every day | ORAL | Status: DC
  Administered 2019-12-25 – 2019-12-26 (×2): 17 g via ORAL
  Filled 2019-12-25 (×2): qty 1

## 2019-12-25 MED ORDER — LEVOTHYROXINE SODIUM 88 MCG PO TABS
88.0000 ug | ORAL_TABLET | Freq: Every day | ORAL | Status: DC
  Administered 2019-12-26: 88 ug via ORAL
  Filled 2019-12-25: qty 1

## 2019-12-25 MED ORDER — ALLOPURINOL 100 MG PO TABS
100.0000 mg | ORAL_TABLET | Freq: Every day | ORAL | Status: DC
  Administered 2019-12-25 – 2019-12-26 (×2): 100 mg via ORAL
  Filled 2019-12-25 (×2): qty 1

## 2019-12-25 MED ORDER — LINAGLIPTIN 5 MG PO TABS
5.0000 mg | ORAL_TABLET | Freq: Every day | ORAL | Status: DC
  Administered 2019-12-26: 5 mg via ORAL
  Filled 2019-12-25: qty 1

## 2019-12-25 NOTE — Social Work (Signed)
TOC CSW consulted with pt.  Pt gave permission to CSW to fax out information to local SNF's.  Pt is undecided about where she wants SNF placement, but she does not want to return to [place].  CSW supplied pt with Medicare list of SNF's.  CSW will continue to follow for dc needs.  Lynnea Vandervoort Tarpley-Carter, MSW, [facility] ED Transitions of Engineer, building services Health [phone]

## 2019-12-25 NOTE — ED Notes (Signed)
ED Provider at bedside. 
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
## 2019-12-25 NOTE — Social Work (Signed)
CSW received authorization from [employer] [phone]. Auth # [id number] was given by insurance.  Insurance advised CSW that clinical nurse would call for more information if needed.  Allow 5-14 days for review..  CSW will continue to follow for dc needs.  Reice Bienvenue Tarpley-Carter, MSW, [facility] ED Transitions of Engineer, building services Health [phone]

## 2019-12-25 NOTE — Social Work (Signed)
Consult request has been received. CSW attempting to follow up at present time.   CSW will continue to follow for dc needs.  Kennedey Digilio Tarpley-Carter, MSW, [place] ED Transitions of Care Clinical Social Worker [place] [phone]

## 2019-12-25 NOTE — ED Triage Notes (Signed)
Per EMS-was seen here for the same symptoms on the 25 th-daughter states she is unable to take care of her-states wound on her lower back/buttocks is getting worse

## 2019-12-25 NOTE — ED Provider Notes (Signed)
  Physical Exam  BP (!) 161/72   Pulse 72   Temp 99.9 F (37.7 C)   Resp 16   SpO2 100%   Physical Exam  ED Course/Procedures     Procedures  MDM  Patient care assumed at 3 PM. Patient was recently here because of increased weakness. Patient has stage II decub ulcer that is unchanged.  She came in tachycardic but is supposed to be on Cardizem and metoprolol but did not take it.  Patient daughter is unable to care for her at home.  Patient has not been ambulating.  Signout pending D-dimer and lab work and CT abdomen pelvis.   8:01 PM Labs showed elevated D-dimer at 1.2 Her sodium is slightly elevated at 146.  Patient was hydrated in the ED.  Her CT abdomen pelvis showed possible cirrhosis and recommend MRI which can be done at the outpatient basis. Patient also has mild fecal impaction.  I perform rectal exam and there is some soft stool.  Social work also saw patient recommended rehab.  Patient initially did not want rehab during last ED stay but now is amenable to rehab.  At this point, patient does not qualify for admission to the hospital.  Patient will be holding in the ED for placement.      Drenda Freeze, MD 12/25/19 2003

## 2019-12-26 DIAGNOSIS — L89152 Pressure ulcer of sacral region, stage 2: Secondary | ICD-10-CM | POA: Diagnosis not present

## 2019-12-26 NOTE — Social Work (Signed)
CSW contacted pts daughter, [name] [phone].  CSW left HIPPA compliant message with my contact information.  CSW will continue to follow for dc needs.  Fredericka Bottcher Tarpley-Carter, MSW, LCSW-A                  [name] ED Transitions of CareClinical Social Worker [email or web address] [phone]

## 2019-12-26 NOTE — Social Work (Signed)
CSW contacted pts daughter, [name] [phone] in regards to SNF placement.  CSW made [name] aware of insurance and authorization.  CSW discussed SNF/Long Term facility options and acceptances.  CSW will continue to follow for dc needs.  Azalya Galyon Tarpley-Carter, MSW, LCSW-A [name] ED Transitions of CareClinical Social Worker [email or web address] [phone]

## 2019-12-26 NOTE — ED Notes (Addendum)
Pt asking about not getting a lunch tray and being hungry. Obtained orders for diet order placed.
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
## 2019-12-26 NOTE — Social Work (Signed)
CSW spoke with [name] [phone].  [name] stated she would accept pt.  Pt is currently signing admission paperwork.  Doctor and nurse are processing pt for dc and transport to [name].    CSW spoke with [name], pts daughter, she agrees with mom going to [name].    [name] is awaiting paperwork for admission.  1st shift will handoff to 2nd shift TOC.  CSW will continue to follow for dc needs.  [name], MSW, LCSW-A                  [name] ED Transitions of CareClinical Social Worker [name] [phone]

## 2019-12-26 NOTE — Progress Notes (Signed)
Spoke to [place], Admission Coordinator and scheduled dc today to SNF rehab. ED RN updated.  TOC CM spoke to pt and she reviewed [facility] admission paperwork and signed. TOC CM faxed to [name] SNF. [facility] called for transport. Call report to # [phone] room #3251. ED RN updated. [place], [place] ED TOC CM [phone]

## 2019-12-26 NOTE — ED Notes (Signed)
Made Secretary aware diet order is in for this patient, and please order patient a lunch tray.

## 2019-12-27 LAB — URINE CULTURE: Culture: 60000 — AB

## 2019-12-28 ENCOUNTER — Telehealth: Payer: Self-pay | Admitting: Emergency Medicine

## 2019-12-28 NOTE — Telephone Encounter (Signed)
Post ED Visit - Positive Culture Follow-up  Culture report reviewed by antimicrobial stewardship pharmacist: [place] Team []  Elenor Quinones, Pharm.D. []  Heide Guile, Pharm.D., BCPS AQ-ID []  Parks Neptune, Pharm.D., BCPS []  Alycia Rossetti, Pharm.D., BCPS []  [place], [place].D., BCPS, AAHIVP []  Legrand Como, Pharm.D., BCPS, AAHIVP []  Salome Arnt, PharmD, BCPS []  Johnnette Gourd, PharmD, BCPS []  Hughes Better, PharmD, BCPS []  Leeroy Cha, PharmD []  Laqueta Linden, PharmD, BCPS []  Albertina Parr, PharmD  [place] Team []  Leodis Sias, PharmD []  Lindell Spar, PharmD []  Royetta Asal, PharmD [x]  Graylin Shiver, Rph []  Rema Fendt) Glennon Mac, PharmD []  Arlyn Dunning, PharmD []  Netta Cedars, PharmD []  Dia Sitter, PharmD []  Leone Haven, PharmD []  Gretta Arab, PharmD []  Theodis Shove, PharmD []  Peggyann Juba, PharmD []  Reuel Boom, PharmD   Positive urine culture Treated with fluconazole, organism sensitive to the same and no further patient follow-up is required at this time.  Hazle Nordmann 12/28/2019, 10:40 AM

## 2020-01-09 ENCOUNTER — Telehealth: Payer: Self-pay | Admitting: *Deleted

## 2020-01-09 ENCOUNTER — Encounter: Payer: Medicare Other | Admitting: Student

## 2020-01-09 NOTE — Telephone Encounter (Signed)
Pt did not come to her appt this afternoon. Called pt - stated she's in [facility] facility and she did not know about this appt.

## 2020-03-29 ENCOUNTER — Emergency Department (HOSPITAL_COMMUNITY): Payer: Medicare Other

## 2020-03-29 ENCOUNTER — Inpatient Hospital Stay (HOSPITAL_COMMUNITY): Payer: Medicare Other

## 2020-03-29 ENCOUNTER — Other Ambulatory Visit: Payer: Self-pay

## 2020-03-29 ENCOUNTER — Inpatient Hospital Stay (HOSPITAL_COMMUNITY)
Admission: EM | Admit: 2020-03-29 | Discharge: 2020-05-31 | DRG: 802 | Disposition: E | Payer: Medicare Other | Source: Skilled Nursing Facility | Attending: Internal Medicine | Admitting: Internal Medicine

## 2020-03-29 ENCOUNTER — Encounter (HOSPITAL_COMMUNITY): Payer: Self-pay

## 2020-03-29 DIAGNOSIS — Z7984 Long term (current) use of oral hypoglycemic drugs: Secondary | ICD-10-CM

## 2020-03-29 DIAGNOSIS — D649 Anemia, unspecified: Secondary | ICD-10-CM | POA: Diagnosis not present

## 2020-03-29 DIAGNOSIS — Z87891 Personal history of nicotine dependence: Secondary | ICD-10-CM

## 2020-03-29 DIAGNOSIS — E1142 Type 2 diabetes mellitus with diabetic polyneuropathy: Secondary | ICD-10-CM | POA: Diagnosis present

## 2020-03-29 DIAGNOSIS — Z794 Long term (current) use of insulin: Secondary | ICD-10-CM

## 2020-03-29 DIAGNOSIS — N390 Urinary tract infection, site not specified: Secondary | ICD-10-CM | POA: Diagnosis present

## 2020-03-29 DIAGNOSIS — I480 Paroxysmal atrial fibrillation: Secondary | ICD-10-CM | POA: Diagnosis not present

## 2020-03-29 DIAGNOSIS — M4647 Discitis, unspecified, lumbosacral region: Secondary | ICD-10-CM | POA: Diagnosis not present

## 2020-03-29 DIAGNOSIS — E872 Acidosis: Secondary | ICD-10-CM | POA: Diagnosis present

## 2020-03-29 DIAGNOSIS — L89139 Pressure ulcer of right lower back, unspecified stage: Secondary | ICD-10-CM | POA: Diagnosis not present

## 2020-03-29 DIAGNOSIS — H409 Unspecified glaucoma: Secondary | ICD-10-CM | POA: Diagnosis present

## 2020-03-29 DIAGNOSIS — I5033 Acute on chronic diastolic (congestive) heart failure: Secondary | ICD-10-CM | POA: Diagnosis not present

## 2020-03-29 DIAGNOSIS — Z20822 Contact with and (suspected) exposure to covid-19: Secondary | ICD-10-CM | POA: Diagnosis present

## 2020-03-29 DIAGNOSIS — Z7189 Other specified counseling: Secondary | ICD-10-CM | POA: Diagnosis not present

## 2020-03-29 DIAGNOSIS — L89153 Pressure ulcer of sacral region, stage 3: Secondary | ICD-10-CM | POA: Diagnosis present

## 2020-03-29 DIAGNOSIS — M4646 Discitis, unspecified, lumbar region: Secondary | ICD-10-CM | POA: Diagnosis present

## 2020-03-29 DIAGNOSIS — B952 Enterococcus as the cause of diseases classified elsewhere: Secondary | ICD-10-CM | POA: Diagnosis not present

## 2020-03-29 DIAGNOSIS — Z6830 Body mass index (BMI) 30.0-30.9, adult: Secondary | ICD-10-CM

## 2020-03-29 DIAGNOSIS — Z885 Allergy status to narcotic agent status: Secondary | ICD-10-CM

## 2020-03-29 DIAGNOSIS — Z66 Do not resuscitate: Secondary | ICD-10-CM | POA: Diagnosis present

## 2020-03-29 DIAGNOSIS — I509 Heart failure, unspecified: Secondary | ICD-10-CM

## 2020-03-29 DIAGNOSIS — R140 Abdominal distension (gaseous): Secondary | ICD-10-CM

## 2020-03-29 DIAGNOSIS — G9349 Other encephalopathy: Secondary | ICD-10-CM | POA: Diagnosis present

## 2020-03-29 DIAGNOSIS — D509 Iron deficiency anemia, unspecified: Secondary | ICD-10-CM | POA: Diagnosis present

## 2020-03-29 DIAGNOSIS — I130 Hypertensive heart and chronic kidney disease with heart failure and stage 1 through stage 4 chronic kidney disease, or unspecified chronic kidney disease: Secondary | ICD-10-CM | POA: Diagnosis present

## 2020-03-29 DIAGNOSIS — G40901 Epilepsy, unspecified, not intractable, with status epilepticus: Secondary | ICD-10-CM | POA: Diagnosis present

## 2020-03-29 DIAGNOSIS — Z7401 Bed confinement status: Secondary | ICD-10-CM | POA: Diagnosis not present

## 2020-03-29 DIAGNOSIS — R54 Age-related physical debility: Secondary | ICD-10-CM | POA: Diagnosis present

## 2020-03-29 DIAGNOSIS — Z87440 Personal history of urinary (tract) infections: Secondary | ICD-10-CM

## 2020-03-29 DIAGNOSIS — R0902 Hypoxemia: Secondary | ICD-10-CM

## 2020-03-29 DIAGNOSIS — Z841 Family history of disorders of kidney and ureter: Secondary | ICD-10-CM

## 2020-03-29 DIAGNOSIS — N184 Chronic kidney disease, stage 4 (severe): Secondary | ICD-10-CM | POA: Diagnosis present

## 2020-03-29 DIAGNOSIS — M4640 Discitis, unspecified, site unspecified: Secondary | ICD-10-CM

## 2020-03-29 DIAGNOSIS — K7469 Other cirrhosis of liver: Secondary | ICD-10-CM | POA: Diagnosis present

## 2020-03-29 DIAGNOSIS — N179 Acute kidney failure, unspecified: Secondary | ICD-10-CM | POA: Diagnosis not present

## 2020-03-29 DIAGNOSIS — E440 Moderate protein-calorie malnutrition: Secondary | ICD-10-CM | POA: Diagnosis present

## 2020-03-29 DIAGNOSIS — J90 Pleural effusion, not elsewhere classified: Secondary | ICD-10-CM

## 2020-03-29 DIAGNOSIS — R109 Unspecified abdominal pain: Secondary | ICD-10-CM | POA: Diagnosis not present

## 2020-03-29 DIAGNOSIS — D72829 Elevated white blood cell count, unspecified: Secondary | ICD-10-CM | POA: Diagnosis not present

## 2020-03-29 DIAGNOSIS — Z1621 Resistance to vancomycin: Secondary | ICD-10-CM | POA: Diagnosis present

## 2020-03-29 DIAGNOSIS — E871 Hypo-osmolality and hyponatremia: Secondary | ICD-10-CM | POA: Diagnosis not present

## 2020-03-29 DIAGNOSIS — E1122 Type 2 diabetes mellitus with diabetic chronic kidney disease: Secondary | ICD-10-CM | POA: Diagnosis present

## 2020-03-29 DIAGNOSIS — I152 Hypertension secondary to endocrine disorders: Secondary | ICD-10-CM | POA: Diagnosis present

## 2020-03-29 DIAGNOSIS — R197 Diarrhea, unspecified: Secondary | ICD-10-CM | POA: Diagnosis not present

## 2020-03-29 DIAGNOSIS — Z79899 Other long term (current) drug therapy: Secondary | ICD-10-CM

## 2020-03-29 DIAGNOSIS — D62 Acute posthemorrhagic anemia: Principal | ICD-10-CM | POA: Diagnosis present

## 2020-03-29 DIAGNOSIS — K5641 Fecal impaction: Secondary | ICD-10-CM | POA: Diagnosis not present

## 2020-03-29 DIAGNOSIS — Z515 Encounter for palliative care: Secondary | ICD-10-CM | POA: Diagnosis not present

## 2020-03-29 DIAGNOSIS — E875 Hyperkalemia: Secondary | ICD-10-CM | POA: Diagnosis not present

## 2020-03-29 DIAGNOSIS — Z993 Dependence on wheelchair: Secondary | ICD-10-CM

## 2020-03-29 DIAGNOSIS — M4626 Osteomyelitis of vertebra, lumbar region: Secondary | ICD-10-CM | POA: Diagnosis not present

## 2020-03-29 DIAGNOSIS — Z7901 Long term (current) use of anticoagulants: Secondary | ICD-10-CM

## 2020-03-29 DIAGNOSIS — E039 Hypothyroidism, unspecified: Secondary | ICD-10-CM | POA: Diagnosis not present

## 2020-03-29 DIAGNOSIS — R4182 Altered mental status, unspecified: Secondary | ICD-10-CM

## 2020-03-29 DIAGNOSIS — R1310 Dysphagia, unspecified: Secondary | ICD-10-CM

## 2020-03-29 DIAGNOSIS — G9341 Metabolic encephalopathy: Secondary | ICD-10-CM | POA: Diagnosis present

## 2020-03-29 DIAGNOSIS — Z79890 Hormone replacement therapy (postmenopausal): Secondary | ICD-10-CM

## 2020-03-29 DIAGNOSIS — I5032 Chronic diastolic (congestive) heart failure: Secondary | ICD-10-CM | POA: Diagnosis present

## 2020-03-29 DIAGNOSIS — K644 Residual hemorrhoidal skin tags: Secondary | ICD-10-CM | POA: Diagnosis present

## 2020-03-29 DIAGNOSIS — T361X5A Adverse effect of cephalosporins and other beta-lactam antibiotics, initial encounter: Secondary | ICD-10-CM | POA: Diagnosis not present

## 2020-03-29 DIAGNOSIS — J189 Pneumonia, unspecified organism: Secondary | ICD-10-CM | POA: Diagnosis present

## 2020-03-29 DIAGNOSIS — I959 Hypotension, unspecified: Secondary | ICD-10-CM | POA: Diagnosis present

## 2020-03-29 DIAGNOSIS — R609 Edema, unspecified: Secondary | ICD-10-CM | POA: Diagnosis not present

## 2020-03-29 DIAGNOSIS — E870 Hyperosmolality and hypernatremia: Secondary | ICD-10-CM | POA: Diagnosis not present

## 2020-03-29 DIAGNOSIS — E1321 Other specified diabetes mellitus with diabetic nephropathy: Secondary | ICD-10-CM | POA: Diagnosis not present

## 2020-03-29 DIAGNOSIS — E1159 Type 2 diabetes mellitus with other circulatory complications: Secondary | ICD-10-CM | POA: Diagnosis not present

## 2020-03-29 DIAGNOSIS — R6881 Early satiety: Secondary | ICD-10-CM | POA: Diagnosis not present

## 2020-03-29 DIAGNOSIS — B9689 Other specified bacterial agents as the cause of diseases classified elsewhere: Secondary | ICD-10-CM | POA: Diagnosis not present

## 2020-03-29 DIAGNOSIS — R509 Fever, unspecified: Secondary | ICD-10-CM | POA: Diagnosis not present

## 2020-03-29 DIAGNOSIS — N141 Nephropathy induced by other drugs, medicaments and biological substances: Secondary | ICD-10-CM | POA: Diagnosis not present

## 2020-03-29 DIAGNOSIS — M109 Gout, unspecified: Secondary | ICD-10-CM | POA: Diagnosis present

## 2020-03-29 DIAGNOSIS — T508X5A Adverse effect of diagnostic agents, initial encounter: Secondary | ICD-10-CM | POA: Diagnosis not present

## 2020-03-29 DIAGNOSIS — E876 Hypokalemia: Secondary | ICD-10-CM | POA: Diagnosis present

## 2020-03-29 DIAGNOSIS — M1990 Unspecified osteoarthritis, unspecified site: Secondary | ICD-10-CM | POA: Diagnosis present

## 2020-03-29 DIAGNOSIS — R1032 Left lower quadrant pain: Secondary | ICD-10-CM | POA: Diagnosis not present

## 2020-03-29 DIAGNOSIS — I5031 Acute diastolic (congestive) heart failure: Secondary | ICD-10-CM | POA: Diagnosis not present

## 2020-03-29 DIAGNOSIS — K922 Gastrointestinal hemorrhage, unspecified: Secondary | ICD-10-CM | POA: Diagnosis present

## 2020-03-29 DIAGNOSIS — E11610 Type 2 diabetes mellitus with diabetic neuropathic arthropathy: Secondary | ICD-10-CM | POA: Diagnosis present

## 2020-03-29 DIAGNOSIS — K529 Noninfective gastroenteritis and colitis, unspecified: Secondary | ICD-10-CM | POA: Diagnosis present

## 2020-03-29 DIAGNOSIS — R69 Illness, unspecified: Secondary | ICD-10-CM

## 2020-03-29 DIAGNOSIS — L89149 Pressure ulcer of left lower back, unspecified stage: Secondary | ICD-10-CM | POA: Diagnosis not present

## 2020-03-29 DIAGNOSIS — N289 Disorder of kidney and ureter, unspecified: Secondary | ICD-10-CM | POA: Diagnosis not present

## 2020-03-29 DIAGNOSIS — R32 Unspecified urinary incontinence: Secondary | ICD-10-CM | POA: Diagnosis not present

## 2020-03-29 LAB — CBC WITH DIFFERENTIAL/PLATELET
Abs Immature Granulocytes: 0.03 10*3/uL (ref 0.00–0.07)
Basophils Absolute: 0 10*3/uL (ref 0.0–0.1)
Basophils Relative: 0 %
Eosinophils Absolute: 0.3 10*3/uL (ref 0.0–0.5)
Eosinophils Relative: 3 %
HCT: 12.1 % — ABNORMAL LOW (ref 36.0–46.0)
Hemoglobin: 3.5 g/dL — CL (ref 12.0–15.0)
Immature Granulocytes: 0 %
Lymphocytes Relative: 35 %
Lymphs Abs: 3.1 10*3/uL (ref 0.7–4.0)
MCH: 19.2 pg — ABNORMAL LOW (ref 26.0–34.0)
MCHC: 28.9 g/dL — ABNORMAL LOW (ref 30.0–36.0)
MCV: 66.5 fL — ABNORMAL LOW (ref 80.0–100.0)
Monocytes Absolute: 1 10*3/uL (ref 0.1–1.0)
Monocytes Relative: 11 %
Neutro Abs: 4.6 10*3/uL (ref 1.7–7.7)
Neutrophils Relative %: 51 %
Platelets: 383 10*3/uL (ref 150–400)
RBC: 1.82 MIL/uL — ABNORMAL LOW (ref 3.87–5.11)
RDW: 22.7 % — ABNORMAL HIGH (ref 11.5–15.5)
WBC: 9.1 10*3/uL (ref 4.0–10.5)
nRBC: 0.9 % — ABNORMAL HIGH (ref 0.0–0.2)

## 2020-03-29 LAB — RESPIRATORY PANEL BY RT PCR (FLU A&B, COVID)
Influenza A by PCR: NEGATIVE
Influenza B by PCR: NEGATIVE
SARS Coronavirus 2 by RT PCR: NEGATIVE

## 2020-03-29 LAB — COMPREHENSIVE METABOLIC PANEL
ALT: 12 U/L (ref 0–44)
AST: 23 U/L (ref 15–41)
Albumin: 3.3 g/dL — ABNORMAL LOW (ref 3.5–5.0)
Alkaline Phosphatase: 104 U/L (ref 38–126)
Anion gap: 9 (ref 5–15)
BUN: 74 mg/dL — ABNORMAL HIGH (ref 8–23)
CO2: 18 mmol/L — ABNORMAL LOW (ref 22–32)
Calcium: 8.4 mg/dL — ABNORMAL LOW (ref 8.9–10.3)
Chloride: 108 mmol/L (ref 98–111)
Creatinine, Ser: 2.3 mg/dL — ABNORMAL HIGH (ref 0.44–1.00)
GFR, Estimated: 21 mL/min — ABNORMAL LOW (ref >60)
Glucose, Bld: 158 mg/dL — ABNORMAL HIGH (ref 70–99)
Potassium: 4.7 mmol/L (ref 3.5–5.1)
Sodium: 135 mmol/L (ref 135–145)
Total Bilirubin: 0.4 mg/dL (ref 0.3–1.2)
Total Protein: 7.1 g/dL (ref 6.5–8.1)

## 2020-03-29 LAB — LACTIC ACID, PLASMA: Lactic Acid, Venous: 1.1 mmol/L (ref 0.5–1.9)

## 2020-03-29 LAB — BRAIN NATRIURETIC PEPTIDE: B Natriuretic Peptide: 330 pg/mL — ABNORMAL HIGH (ref 0.0–100.0)

## 2020-03-29 LAB — HEMOGLOBIN AND HEMATOCRIT, BLOOD
HCT: 10.9 % — ABNORMAL LOW (ref 36.0–46.0)
Hemoglobin: 3.1 g/dL — CL (ref 12.0–15.0)

## 2020-03-29 LAB — TROPONIN I (HIGH SENSITIVITY)
Troponin I (High Sensitivity): 13 ng/L (ref <18)
Troponin I (High Sensitivity): 10 ng/L (ref <18)

## 2020-03-29 LAB — SAMPLE TO BLOOD BANK

## 2020-03-29 LAB — PREPARE RBC (CROSSMATCH)

## 2020-03-29 LAB — POC OCCULT BLOOD, ED
Fecal Occult Bld: NEGATIVE
Fecal Occult Bld: POSITIVE — AB

## 2020-03-29 LAB — ABO/RH: ABO/RH(D): AB POS

## 2020-03-29 MED ORDER — LEVOTHYROXINE SODIUM 88 MCG PO TABS
88.0000 ug | ORAL_TABLET | Freq: Every day | ORAL | Status: DC
  Administered 2020-03-30: 88 ug via ORAL
  Filled 2020-03-29: qty 1

## 2020-03-29 MED ORDER — ACETAMINOPHEN 325 MG PO TABS
650.0000 mg | ORAL_TABLET | Freq: Four times a day (QID) | ORAL | Status: DC | PRN
  Filled 2020-03-29: qty 2

## 2020-03-29 MED ORDER — SODIUM CHLORIDE 0.9 % IV SOLN
250.0000 mL | INTRAVENOUS | Status: DC | PRN
  Administered 2020-04-29: 250 mL via INTRAVENOUS

## 2020-03-29 MED ORDER — PANTOPRAZOLE SODIUM 40 MG PO TBEC
40.0000 mg | DELAYED_RELEASE_TABLET | Freq: Every day | ORAL | Status: DC
  Administered 2020-03-30 – 2020-04-24 (×22): 40 mg via ORAL
  Filled 2020-03-29 (×27): qty 1

## 2020-03-29 MED ORDER — SODIUM CHLORIDE 0.9 % IV SOLN
10.0000 mL/h | Freq: Once | INTRAVENOUS | Status: AC
  Administered 2020-03-29: 10 mL/h via INTRAVENOUS

## 2020-03-29 MED ORDER — SODIUM CHLORIDE 0.9% FLUSH
3.0000 mL | INTRAVENOUS | Status: DC | PRN
  Administered 2020-03-29 – 2020-04-29 (×3): 3 mL via INTRAVENOUS

## 2020-03-29 MED ORDER — SODIUM CHLORIDE 0.9% FLUSH
3.0000 mL | Freq: Two times a day (BID) | INTRAVENOUS | Status: DC
  Administered 2020-03-30 – 2020-05-03 (×52): 3 mL via INTRAVENOUS

## 2020-03-29 NOTE — ED Triage Notes (Signed)
Pt BIB EMS. Pt c/o of SOB, cough and CP when she coughs for the last four days. Pt states the facility has not given her anything for it and is not listening to her complaints. Pts family called EMS for pt to be picked up and evaluated here at the hospital. Per EMS facility stated pt gets tested for covid daily; last test was negative.   BP-116/60 Temp-97.0 RR-20 O2-97%
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
## 2020-03-29 NOTE — ED Notes (Signed)
Date and time results received: 03/25/2020 4:13 PM  (use smartphrase ".now" to insert current time)  Test: Hgb Critical Value: 3.5  Name of Provider Notified: Cleotis Lema, RN  Orders Received? Or Actions Taken?: Actions Taken: Notified Primary RN Cleotis Lema

## 2020-03-29 NOTE — ED Notes (Signed)
Perineal care was performed and pure wick was placed.

## 2020-03-29 NOTE — ED Notes (Signed)
Facility pt is from [facility] facility.

## 2020-03-30 ENCOUNTER — Inpatient Hospital Stay (HOSPITAL_COMMUNITY): Payer: Medicare Other

## 2020-03-30 ENCOUNTER — Other Ambulatory Visit: Payer: Self-pay

## 2020-03-30 DIAGNOSIS — E1159 Type 2 diabetes mellitus with other circulatory complications: Secondary | ICD-10-CM | POA: Diagnosis not present

## 2020-03-30 DIAGNOSIS — I5033 Acute on chronic diastolic (congestive) heart failure: Secondary | ICD-10-CM | POA: Diagnosis not present

## 2020-03-30 DIAGNOSIS — E039 Hypothyroidism, unspecified: Secondary | ICD-10-CM

## 2020-03-30 DIAGNOSIS — I152 Hypertension secondary to endocrine disorders: Secondary | ICD-10-CM

## 2020-03-30 DIAGNOSIS — J90 Pleural effusion, not elsewhere classified: Secondary | ICD-10-CM | POA: Diagnosis not present

## 2020-03-30 DIAGNOSIS — I5031 Acute diastolic (congestive) heart failure: Secondary | ICD-10-CM | POA: Diagnosis not present

## 2020-03-30 DIAGNOSIS — N289 Disorder of kidney and ureter, unspecified: Secondary | ICD-10-CM

## 2020-03-30 DIAGNOSIS — R69 Illness, unspecified: Secondary | ICD-10-CM

## 2020-03-30 LAB — GLUCOSE, CAPILLARY
Glucose-Capillary: 101 mg/dL — ABNORMAL HIGH (ref 70–99)
Glucose-Capillary: 121 mg/dL — ABNORMAL HIGH (ref 70–99)
Glucose-Capillary: 156 mg/dL — ABNORMAL HIGH (ref 70–99)

## 2020-03-30 LAB — ECHOCARDIOGRAM COMPLETE
Area-P 1/2: 2.16 cm2
Height: 61.5 in
S' Lateral: 2.2 cm
Weight: 2148.8 oz

## 2020-03-30 LAB — CBC WITH DIFFERENTIAL/PLATELET
Abs Immature Granulocytes: 0.04 10*3/uL (ref 0.00–0.07)
Basophils Absolute: 0.1 10*3/uL (ref 0.0–0.1)
Basophils Relative: 1 %
Eosinophils Absolute: 0.3 10*3/uL (ref 0.0–0.5)
Eosinophils Relative: 4 %
HCT: 22.4 % — ABNORMAL LOW (ref 36.0–46.0)
Hemoglobin: 7 g/dL — ABNORMAL LOW (ref 12.0–15.0)
Immature Granulocytes: 1 %
Lymphocytes Relative: 34 %
Lymphs Abs: 2.7 10*3/uL (ref 0.7–4.0)
MCH: 25 pg — ABNORMAL LOW (ref 26.0–34.0)
MCHC: 31.3 g/dL (ref 30.0–36.0)
MCV: 80 fL (ref 80.0–100.0)
Monocytes Absolute: 0.9 10*3/uL (ref 0.1–1.0)
Monocytes Relative: 11 %
Neutro Abs: 4.1 10*3/uL (ref 1.7–7.7)
Neutrophils Relative %: 49 %
Platelets: 320 10*3/uL (ref 150–400)
RBC: 2.8 MIL/uL — ABNORMAL LOW (ref 3.87–5.11)
RDW: 27.5 % — ABNORMAL HIGH (ref 11.5–15.5)
WBC: 8.1 10*3/uL (ref 4.0–10.5)
nRBC: 0.9 % — ABNORMAL HIGH (ref 0.0–0.2)

## 2020-03-30 LAB — COMPREHENSIVE METABOLIC PANEL
ALT: 12 U/L (ref 0–44)
AST: 19 U/L (ref 15–41)
Albumin: 3.1 g/dL — ABNORMAL LOW (ref 3.5–5.0)
Alkaline Phosphatase: 97 U/L (ref 38–126)
Anion gap: 8 (ref 5–15)
BUN: 72 mg/dL — ABNORMAL HIGH (ref 8–23)
CO2: 18 mmol/L — ABNORMAL LOW (ref 22–32)
Calcium: 8.5 mg/dL — ABNORMAL LOW (ref 8.9–10.3)
Chloride: 111 mmol/L (ref 98–111)
Creatinine, Ser: 2.22 mg/dL — ABNORMAL HIGH (ref 0.44–1.00)
GFR, Estimated: 22 mL/min — ABNORMAL LOW (ref >60)
Glucose, Bld: 113 mg/dL — ABNORMAL HIGH (ref 70–99)
Potassium: 4.8 mmol/L (ref 3.5–5.1)
Sodium: 137 mmol/L (ref 135–145)
Total Bilirubin: 0.7 mg/dL (ref 0.3–1.2)
Total Protein: 6.6 g/dL (ref 6.5–8.1)

## 2020-03-30 LAB — TSH: TSH: 1.718 u[IU]/mL (ref 0.350–4.500)

## 2020-03-30 LAB — HEMOGLOBIN A1C
Hgb A1c MFr Bld: 5.6 % (ref 4.8–5.6)
Mean Plasma Glucose: 114.02 mg/dL

## 2020-03-30 LAB — HEMOGLOBIN AND HEMATOCRIT, BLOOD
HCT: 23.2 % — ABNORMAL LOW (ref 36.0–46.0)
Hemoglobin: 7.5 g/dL — ABNORMAL LOW (ref 12.0–15.0)

## 2020-03-30 LAB — AMMONIA: Ammonia: 15 umol/L (ref 9–35)

## 2020-03-30 MED ORDER — RIFAXIMIN 550 MG PO TABS
550.0000 mg | ORAL_TABLET | Freq: Two times a day (BID) | ORAL | Status: DC
  Administered 2020-03-30 – 2020-04-20 (×41): 550 mg via ORAL
  Filled 2020-03-30 (×42): qty 1

## 2020-03-30 MED ORDER — LEVOTHYROXINE SODIUM 88 MCG PO TABS
88.0000 ug | ORAL_TABLET | Freq: Every day | ORAL | Status: DC
  Administered 2020-04-01 – 2020-04-25 (×23): 88 ug via ORAL
  Filled 2020-03-30 (×25): qty 1

## 2020-03-30 MED ORDER — SODIUM CHLORIDE 0.9 % IV SOLN: INTRAVENOUS | Status: DC

## 2020-03-30 MED ORDER — LACTULOSE 10 GM/15ML PO SOLN
10.0000 g | Freq: Two times a day (BID) | ORAL | Status: DC
  Administered 2020-03-30 – 2020-04-24 (×15): 10 g via ORAL
  Filled 2020-03-30 (×32): qty 30

## 2020-03-30 MED ORDER — METOPROLOL TARTRATE 25 MG PO TABS
12.5000 mg | ORAL_TABLET | Freq: Two times a day (BID) | ORAL | Status: DC
  Administered 2020-03-30 (×2): 12.5 mg via ORAL
  Filled 2020-03-30 (×2): qty 1

## 2020-03-30 MED ORDER — INSULIN ASPART 100 UNIT/ML ~~LOC~~ SOLN
0.0000 [IU] | Freq: Three times a day (TID) | SUBCUTANEOUS | Status: DC
  Administered 2020-03-31 (×2): 2 [IU] via SUBCUTANEOUS
  Administered 2020-03-31 – 2020-04-01 (×3): 1 [IU] via SUBCUTANEOUS
  Administered 2020-04-02: 2 [IU] via SUBCUTANEOUS
  Administered 2020-04-03 (×3): 1 [IU] via SUBCUTANEOUS
  Administered 2020-04-04: 2 [IU] via SUBCUTANEOUS
  Administered 2020-04-04 – 2020-04-05 (×3): 1 [IU] via SUBCUTANEOUS
  Administered 2020-04-05: 2 [IU] via SUBCUTANEOUS
  Administered 2020-04-06 – 2020-04-16 (×9): 1 [IU] via SUBCUTANEOUS
  Administered 2020-04-17: 2 [IU] via SUBCUTANEOUS
  Administered 2020-04-18 – 2020-04-21 (×5): 1 [IU] via SUBCUTANEOUS
  Administered 2020-04-21: 2 [IU] via SUBCUTANEOUS
  Administered 2020-04-23 – 2020-04-25 (×5): 1 [IU] via SUBCUTANEOUS

## 2020-03-30 MED ORDER — PERFLUTREN LIPID MICROSPHERE
1.0000 mL | INTRAVENOUS | Status: AC | PRN
  Administered 2020-03-30: 2 mL via INTRAVENOUS
  Filled 2020-03-30: qty 10

## 2020-03-30 MED ORDER — SODIUM CHLORIDE 0.9 % IV SOLN
50.0000 ug/h | INTRAVENOUS | Status: DC
  Administered 2020-03-30 – 2020-04-01 (×4): 50 ug/h via INTRAVENOUS
  Filled 2020-03-30 (×7): qty 1

## 2020-03-30 MED ORDER — FUROSEMIDE 10 MG/ML IJ SOLN
40.0000 mg | Freq: Once | INTRAMUSCULAR | Status: AC
  Administered 2020-03-30: 40 mg via INTRAVENOUS
  Filled 2020-03-30: qty 4
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
## 2020-03-30 NOTE — Progress Notes (Addendum)
PROGRESS NOTE  Sarah Colon HLK:[id number] DOB: 30-Nov-1945 DOA: 03/08/2020 PCP: Patient, No Pcp Per   LOS: 1 day   Brief Narrative / Interim history: 74 year old female with DM 2, diastolic CHF, HTN, paroxysmal A. fib, chronic kidney disease stage IIIb with baseline creatinine 1.5-1.8, came to the ED with shortness of breath, chest pain, fatigue for the past week.  Also reported poor appetite.  She was found to have a hemoglobin of 3 on admission.  No reports of blood in her stool or black tarry stools.  Fecal occult in the ED was positive, GI was consulted and she was admitted to the hospitalist service.  Normally lives in an SNF, nonambulatory and is in a wheelchair  Subjective / 24h Interval events: Sleepy this morning, but wakes up, no complaints, "I am fine"  Assessment & Plan: Principal Problem Symptomatic anemia, concern for GI bleed -she is on Eliquis for paroxysmal atrial fibrillation, hold for now.  Fecal occult was positive.  Gastroenterology consulted, appreciate input.  Received 2 units of packed red blood cells overnight, hemoglobin 7.0 this morning, go ahead and transfuse the 3rd unit and follow with Lasix  Active Problems Left lower quadrant abdominal pain -no complaints of pain this morning.  CT scan of the abdomen pelvis without acute findings  Liver cirrhosis - new diagnosis for her, daughter unaware. Place on octreotide, lactulose, rifaximin per GI  Acute metabolic encephalopathy - possibly due to her liver disease, will obtain ammonia.  Acute kidney injury on chronic kidney disease stage IIIb-Baseline creatinine 1.5-1.8, 2.3 on admission likely in the setting of profound anemia, improving this morning  Paroxysmal A. fib-in sinus now, hold Eliquis, hold beta-blocker and diltiazem due to hypotension on admission, but now blood pressure seems to be a bit better, start metoprolol at half the home dose.  Continue to hold diltiazem  Acute on chronic diastolic  CHF-appears fluid overloaded with lower extremity edema, BNP is elevated, has pleural effusion on chest x-ray.  IV Lasix x1 this morning given volume with blood transfusions, monitor fluid status, repeat Lasix as needed  Leukocytosis-at her baseline, seen by hematology as an outpatient regularly  Hypothyroidism-continue Synthroid.  TSH normal  DM2-A1c pending, placed on sliding scale   Scheduled Meds: . insulin aspart  0-9 Units Subcutaneous TID WC  . lactulose  10 g Oral BID  . [START ON 04/22/2020] levothyroxine  88 mcg Oral Q0600  . metoprolol tartrate  12.5 mg Oral BID  . pantoprazole  40 mg Oral Daily  . rifaximin  550 mg Oral BID  . sodium chloride flush  3 mL Intravenous Q12H   Continuous Infusions: . sodium chloride    . sodium chloride    . octreotide  (SANDOSTATIN)    IV infusion     PRN Meds:.sodium chloride, acetaminophen, sodium chloride flush  Diet Orders (From admission, onward)    Start     Ordered   03/30/2020 2110  Diet clear liquid Room service appropriate? Yes; Fluid consistency: Thin  Diet effective now       Question Answer Comment  Room service appropriate? Yes   Fluid consistency: Thin      03/03/2020 2119          DVT prophylaxis: Place TED hose Start: 03/20/2020 2108     Code Status: Full Code  Family Communication: no family present  Status is: Inpatient  Remains inpatient appropriate because:Inpatient level of care appropriate due to severity of illness   Dispo: The patient is from:  SNF              Anticipated d/c is to: SNF              Anticipated d/c date is: 3 days              Patient currently is not medically stable to d/c.  Consultants:  GI  Procedures:  None   Microbiology  None   Antimicrobials: None     Objective: Vitals:   03/30/20 0515 03/30/20 0530 03/30/20 0545 03/30/20 0600  BP: (!) 120/50 (!) 107/53 (!) 115/48 (!) 111/51  Pulse: 67 64 65 66  Resp: 15 14 14 15   Temp:      TempSrc:      SpO2: 97% 98% 97%  97%  Weight:      Height:        Intake/Output Summary (Last 24 hours) at 03/30/2020 0647 Last data filed at 03/30/2020 0443 Gross per 24 hour  Intake 1588.33 ml  Output --  Net 1588.33 ml   Filed Weights   03/28/2020 1417  Weight: 60.3 kg    Examination:  Constitutional: NAD Eyes: no scleral icterus ENMT: Mucous membranes are moist.  Neck: normal, supple Respiratory: diminished at the bases but overall clear, no wheezing, no crackles. Normal respiratory effort.  Cardiovascular: Regular rate and rhythm, no murmurs / rubs / gallops. 1+ edema Abdomen: non distended, BS+ Musculoskeletal: no clubbing / cyanosis.  Skin: no rashes Neurologic: moves all 4  Data Reviewed: I have independently reviewed following labs and imaging studies   CBC: Recent Labs  Lab 03/28/2020 1455 03/25/2020 1621 03/30/20 0500  WBC 9.1  --  8.1  NEUTROABS 4.6  --  4.1  HGB 3.5* 3.1* 7.0*  HCT 12.1* 10.9* 22.4*  MCV 66.5*  --  80.0  PLT 383  --  270   Basic Metabolic Panel: Recent Labs  Lab 03/18/2020 1455 03/30/20 0500  NA 135 137  K 4.7 4.8  CL 108 111  CO2 18* 18*  GLUCOSE 158* 113*  BUN 74* 72*  CREATININE 2.30* 2.22*  CALCIUM 8.4* 8.5*   Liver Function Tests: Recent Labs  Lab 03/07/2020 1455 03/30/20 0500  AST 23 19  ALT 12 12  ALKPHOS 104 97  BILITOT 0.4 0.7  PROT 7.1 6.6  ALBUMIN 3.3* 3.1*   Coagulation Profile: No results for input(s): INR, PROTIME in the last 168 hours. HbA1C: No results for input(s): HGBA1C in the last 72 hours. CBG: No results for input(s): GLUCAP in the last 168 hours.  Recent Results (from the past 240 hour(s))  Respiratory Panel by RT PCR (Flu A&B, Covid) - Nasopharyngeal Swab     Status: None   Collection Time: 03/17/2020  2:57 PM   Specimen: Nasopharyngeal Swab  Result Value Ref Range Status   SARS Coronavirus 2 by RT PCR NEGATIVE NEGATIVE Final    Comment: (NOTE) SARS-CoV-2 target nucleic acids are NOT DETECTED.  The SARS-CoV-2 RNA is  generally detectable in upper respiratoy specimens during the acute phase of infection. The lowest concentration of SARS-CoV-2 viral copies this assay can detect is 131 copies/mL. A negative result does not preclude SARS-Cov-2 infection and should not be used as the sole basis for treatment or other patient management decisions. A negative result may occur with  improper specimen collection/handling, submission of specimen other than nasopharyngeal swab, presence of viral mutation(s) within the areas targeted by this assay, and inadequate number of viral copies (<131 copies/mL). A negative result  must be combined with clinical observations, patient history, and epidemiological information. The expected result is Negative.  Fact Sheet for Patients:  PinkCheek.be  Fact Sheet for Healthcare Providers:  GravelBags.it  This test is no t yet approved or cleared by the [place] FDA and  has been authorized for detection and/or diagnosis of SARS-CoV-2 by FDA under an Emergency Use Authorization (EUA). This EUA will remain  in effect (meaning this test can be used) for the duration of the COVID-19 declaration under Section 564(b)(1) of the Act, 21 U.S.C. section 360bbb-3(b)(1), unless the authorization is terminated or revoked sooner.     Influenza A by PCR NEGATIVE NEGATIVE Final   Influenza B by PCR NEGATIVE NEGATIVE Final    Comment: (NOTE) The Xpert Xpress SARS-CoV-2/FLU/RSV assay is intended as an aid in  the diagnosis of influenza from Nasopharyngeal swab specimens and  should not be used as a sole basis for treatment. Nasal washings and  aspirates are unacceptable for Xpert Xpress SARS-CoV-2/FLU/RSV  testing.  Fact Sheet for Patients: PinkCheek.be  Fact Sheet for Healthcare Providers: GravelBags.it  This test is not yet approved or cleared by the [place] FDA and  has been authorized for detection and/or diagnosis of SARS-CoV-2 by  FDA under an Emergency Use Authorization (EUA). This EUA will remain  in effect (meaning this test can be used) for the duration of the  Covid-19 declaration under Section 564(b)(1) of the Act, 21  U.S.C. section 360bbb-3(b)(1), unless the authorization is  terminated or revoked. Performed at [facility], [place] [address], [place], [place] [address]      Radiology Studies: CT ABDOMEN WO CONTRAST  Result Date: 03/16/2020 CLINICAL DATA:  Abdominal distension, right lower quadrant abdominal pain EXAM: CT ABDOMEN AND PELVIS WITHOUT CONTRAST TECHNIQUE: Multidetector CT imaging of the abdomen and pelvis was performed following the standard protocol without IV contrast. COMPARISON:  12/25/2019 FINDINGS: Lower chest: Moderate right and small left pleural effusions are seen within the visualized lung bases with compressive atelectasis of the lower lobes bilaterally, right greater than left. There is marked hypoattenuation of the cardiac blood pool in keeping with changes of severe anemia. Cardiac size is within normal limits. No pericardial effusion. Hepatobiliary: The liver contour is nodular in keeping with changes of cirrhosis. Cholelithiasis noted without pericholecystic inflammatory change. No intra or extrahepatic biliary ductal dilation. Pancreas: Unremarkable Spleen: Unremarkable Adrenals/Urinary Tract: The adrenal glands are unremarkable. The kidneys are normal. The bladder is mildly thick walled, similar prior examination. This is nonspecific in can be seen the setting of chronic bladder outlet obstruction or chronic diffuse cystitis. Stomach/Bowel: Moderate stool noted within the rectal vault, similar to that noted on prior examination. The stomach, small bowel, and large bowel are otherwise unremarkable. The appendix is not clearly identified, however, there are no secondary signs of appendicitis  noted within the right lower quadrant of the abdomen. No free intraperitoneal gas. Trace perihepatic ascites. Vascular/Lymphatic: Mild aortoiliac atherosclerotic calcification is present. No aortic aneurysm. No pathologic adenopathy within the abdomen and pelvis. Reproductive: Multiple densely calcified involuted fibroids are seen within the uterus. The pelvic organs are otherwise unremarkable. Other: Mild subcutaneous edema is seen within the dependent flanks in keeping with at least mild anasarca. Musculoskeletal: Chronic L5 compression fracture is unchanged. Interval development of superimposed Schmorl's nodes within the inferior endplate of L4 and superior endplate of L5. IMPRESSION: No radiographic explanation for the patient's reported symptoms. Incidental findings as noted above. Marked hypoattenuation of the cardiac blood  pool is in keeping with severe anemia and correlation with laboratory examination is recommended. Aortic Atherosclerosis (ICD10-I70.0). Electronically Signed   By: Fidela Salisbury MD   On: 03/23/2020 22:24   CT PELVIS WO CONTRAST  Result Date: 03/19/2020 CLINICAL DATA:  Abdominal distension, right lower quadrant abdominal pain EXAM: CT ABDOMEN AND PELVIS WITHOUT CONTRAST TECHNIQUE: Multidetector CT imaging of the abdomen and pelvis was performed following the standard protocol without IV contrast. COMPARISON:  12/25/2019 FINDINGS: Lower chest: Moderate right and small left pleural effusions are seen within the visualized lung bases with compressive atelectasis of the lower lobes bilaterally, right greater than left. There is marked hypoattenuation of the cardiac blood pool in keeping with changes of severe anemia. Cardiac size is within normal limits. No pericardial effusion. Hepatobiliary: The liver contour is nodular in keeping with changes of cirrhosis. Cholelithiasis noted without pericholecystic inflammatory change. No intra or extrahepatic biliary ductal dilation. Pancreas:  Unremarkable Spleen: Unremarkable Adrenals/Urinary Tract: The adrenal glands are unremarkable. The kidneys are normal. The bladder is mildly thick walled, similar prior examination. This is nonspecific in can be seen the setting of chronic bladder outlet obstruction or chronic diffuse cystitis. Stomach/Bowel: Moderate stool noted within the rectal vault, similar to that noted on prior examination. The stomach, small bowel, and large bowel are otherwise unremarkable. The appendix is not clearly identified, however, there are no secondary signs of appendicitis noted within the right lower quadrant of the abdomen. No free intraperitoneal gas. Trace perihepatic ascites. Vascular/Lymphatic: Mild aortoiliac atherosclerotic calcification is present. No aortic aneurysm. No pathologic adenopathy within the abdomen and pelvis. Reproductive: Multiple densely calcified involuted fibroids are seen within the uterus. The pelvic organs are otherwise unremarkable. Other: Mild subcutaneous edema is seen within the dependent flanks in keeping with at least mild anasarca. Musculoskeletal: Chronic L5 compression fracture is unchanged. Interval development of superimposed Schmorl's nodes within the inferior endplate of L4 and superior endplate of L5. IMPRESSION: No radiographic explanation for the patient's reported symptoms. Incidental findings as noted above. Marked hypoattenuation of the cardiac blood pool is in keeping with severe anemia and correlation with laboratory examination is recommended. Aortic Atherosclerosis (ICD10-I70.0). Electronically Signed   By: Fidela Salisbury MD   On: 03/01/2020 22:24   DG Chest Port 1 View  Result Date: 03/19/2020 CLINICAL DATA:  Shortness of breath.  Chest pain.  Cough. EXAM: PORTABLE CHEST 1 VIEW COMPARISON:  12/23/2019 FINDINGS: Heart is enlarged in also accentuated by technique. Suspect RIGHT pleural effusion. Opacity at the RIGHT lung base is favored to represent atelectasis but could  represent early infiltrate. Mild interstitial and perihilar opacities favor mild edema. IMPRESSION: 1. Cardiomegaly and mild edema. 2. RIGHT lower lobe atelectasis or infiltrate. Electronically Signed   By: Nolon Nations M.D.   On: 03/11/2020 15:28    Marzetta Board, MD, PhD [facility]  Between 7 am - 7 pm I am available, please contact me via Amion or Securechat  Between 7 pm - 7 am I am not available, please contact night coverage MD/APP via Amion

## 2020-03-30 NOTE — Progress Notes (Signed)
  Echocardiogram 2D Echocardiogram has been performed.  Jennette Dubin 03/30/2020, 12:13 PM
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
## 2020-03-30 NOTE — Plan of Care (Signed)

## 2020-03-31 ENCOUNTER — Inpatient Hospital Stay (HOSPITAL_COMMUNITY): Payer: Medicare Other | Admitting: Anesthesiology

## 2020-03-31 ENCOUNTER — Encounter (HOSPITAL_COMMUNITY): Admission: EM | Disposition: E | Payer: Self-pay | Source: Skilled Nursing Facility | Attending: Internal Medicine

## 2020-03-31 DIAGNOSIS — E039 Hypothyroidism, unspecified: Secondary | ICD-10-CM | POA: Diagnosis not present

## 2020-03-31 DIAGNOSIS — E1159 Type 2 diabetes mellitus with other circulatory complications: Secondary | ICD-10-CM | POA: Diagnosis not present

## 2020-03-31 DIAGNOSIS — I5033 Acute on chronic diastolic (congestive) heart failure: Secondary | ICD-10-CM | POA: Diagnosis not present

## 2020-03-31 DIAGNOSIS — D649 Anemia, unspecified: Secondary | ICD-10-CM | POA: Diagnosis not present

## 2020-03-31 HISTORY — PX: BIOPSY: SHX5522

## 2020-03-31 HISTORY — PX: ESOPHAGOGASTRODUODENOSCOPY (EGD) WITH PROPOFOL: SHX5813

## 2020-03-31 LAB — HEMOGLOBIN AND HEMATOCRIT, BLOOD
HCT: 29.5 % — ABNORMAL LOW (ref 36.0–46.0)
Hemoglobin: 9.1 g/dL — ABNORMAL LOW (ref 12.0–15.0)

## 2020-03-31 LAB — CBC
HCT: 22.7 % — ABNORMAL LOW (ref 36.0–46.0)
Hemoglobin: 7 g/dL — ABNORMAL LOW (ref 12.0–15.0)
MCH: 24.3 pg — ABNORMAL LOW (ref 26.0–34.0)
MCHC: 30.8 g/dL (ref 30.0–36.0)
MCV: 78.8 fL — ABNORMAL LOW (ref 80.0–100.0)
Platelets: 300 10*3/uL (ref 150–400)
RBC: 2.88 MIL/uL — ABNORMAL LOW (ref 3.87–5.11)
RDW: 27.2 % — ABNORMAL HIGH (ref 11.5–15.5)
WBC: 7.9 10*3/uL (ref 4.0–10.5)
nRBC: 1.1 % — ABNORMAL HIGH (ref 0.0–0.2)

## 2020-03-31 LAB — PROTIME-INR
INR: 1.9 — ABNORMAL HIGH (ref 0.8–1.2)
Prothrombin Time: 21.5 seconds — ABNORMAL HIGH (ref 11.4–15.2)

## 2020-03-31 LAB — PREPARE RBC (CROSSMATCH)

## 2020-03-31 LAB — COMPREHENSIVE METABOLIC PANEL
ALT: 12 U/L (ref 0–44)
AST: 21 U/L (ref 15–41)
Albumin: 3 g/dL — ABNORMAL LOW (ref 3.5–5.0)
Alkaline Phosphatase: 92 U/L (ref 38–126)
Anion gap: 7 (ref 5–15)
BUN: 68 mg/dL — ABNORMAL HIGH (ref 8–23)
CO2: 19 mmol/L — ABNORMAL LOW (ref 22–32)
Calcium: 8.6 mg/dL — ABNORMAL LOW (ref 8.9–10.3)
Chloride: 112 mmol/L — ABNORMAL HIGH (ref 98–111)
Creatinine, Ser: 2.04 mg/dL — ABNORMAL HIGH (ref 0.44–1.00)
GFR, Estimated: 25 mL/min — ABNORMAL LOW (ref >60)
Glucose, Bld: 151 mg/dL — ABNORMAL HIGH (ref 70–99)
Potassium: 5.5 mmol/L — ABNORMAL HIGH (ref 3.5–5.1)
Sodium: 138 mmol/L (ref 135–145)
Total Bilirubin: 0.6 mg/dL (ref 0.3–1.2)
Total Protein: 6.2 g/dL — ABNORMAL LOW (ref 6.5–8.1)

## 2020-03-31 LAB — GLUCOSE, CAPILLARY
Glucose-Capillary: 160 mg/dL — ABNORMAL HIGH (ref 70–99)
Glucose-Capillary: 157 mg/dL — ABNORMAL HIGH (ref 70–99)
Glucose-Capillary: 124 mg/dL — ABNORMAL HIGH (ref 70–99)
Glucose-Capillary: 151 mg/dL — ABNORMAL HIGH (ref 70–99)
Glucose-Capillary: 129 mg/dL — ABNORMAL HIGH (ref 70–99)

## 2020-03-31 LAB — MRSA PCR SCREENING: MRSA by PCR: POSITIVE — AB

## 2020-03-31 SURGERY — ESOPHAGOGASTRODUODENOSCOPY (EGD) WITH PROPOFOL
Anesthesia: Monitor Anesthesia Care

## 2020-03-31 MED ORDER — SODIUM CHLORIDE 0.9% IV SOLUTION: Freq: Once | INTRAVENOUS | Status: DC

## 2020-03-31 MED ORDER — METOPROLOL TARTRATE 12.5 MG HALF TABLET
25.0000 mg | ORAL_TABLET | Freq: Two times a day (BID) | ORAL | Status: DC
  Administered 2020-03-31 – 2020-04-25 (×50): 25 mg via ORAL
  Filled 2020-03-31 (×52): qty 1

## 2020-03-31 MED ORDER — ONDANSETRON HCL 4 MG/2ML IJ SOLN: 4.0000 mg | INTRAMUSCULAR | Status: DC | PRN

## 2020-03-31 MED ORDER — PEG 3350-KCL-NA BICARB-NACL 420 G PO SOLR
4000.0000 mL | Freq: Once | ORAL | Status: AC
  Administered 2020-03-31: 4000 mL via ORAL

## 2020-03-31 MED ORDER — SODIUM CHLORIDE 0.9% IV SOLUTION: Freq: Once | INTRAVENOUS | Status: AC

## 2020-03-31 MED ORDER — MUPIROCIN 2 % EX OINT
1.0000 "application " | TOPICAL_OINTMENT | Freq: Two times a day (BID) | CUTANEOUS | Status: AC
  Administered 2020-03-31 – 2020-04-04 (×10): 1 via NASAL
  Filled 2020-03-31: qty 22

## 2020-03-31 MED ORDER — PROPOFOL 500 MG/50ML IV EMUL
INTRAVENOUS | Status: DC | PRN
  Administered 2020-03-31: 50 ug/kg/min via INTRAVENOUS

## 2020-03-31 MED ORDER — LIDOCAINE HCL 1 % IJ SOLN
INTRAMUSCULAR | Status: DC | PRN
  Administered 2020-03-31: 50 mg via INTRADERMAL

## 2020-03-31 MED ORDER — METOPROLOL TARTRATE 25 MG PO TABS: 25.0000 mg | ORAL_TABLET | Freq: Two times a day (BID) | ORAL | Status: DC

## 2020-03-31 MED ORDER — FUROSEMIDE 10 MG/ML IJ SOLN
40.0000 mg | Freq: Once | INTRAMUSCULAR | Status: AC
  Administered 2020-03-31: 40 mg via INTRAVENOUS
  Filled 2020-03-31: qty 4

## 2020-03-31 MED ORDER — CHLORHEXIDINE GLUCONATE CLOTH 2 % EX PADS
6.0000 | MEDICATED_PAD | Freq: Every day | CUTANEOUS | Status: AC
  Administered 2020-04-01 – 2020-04-04 (×4): 6 via TOPICAL

## 2020-03-31 SURGICAL SUPPLY — 15 items

## 2020-03-31 NOTE — Consult Note (Signed)
[facility] Nurse wound consult note Consultation was completed by review of records, images and assistance from the bedside nurse/clinical staff.   Reason for Consult: Wound type: healing Stage 3 Pressure injury Pressure Injury POA: Yes Measurement:less than 1cm Wound bed: bleeding scar tissue Drainage (amount, consistency, odor) none Periwound:evidence of larger full thickness healed wound Dressing procedure/placement/frequency: Foam dressing to protect chronic, healing sacral pressure injury; change every 3 days. Lift to inspect q shift for changes in the wound condition.  Chair pressure redistribution pad for chair when patient up and to be sent with patient to SNF for use.   Re consult if needed, will not follow at this time. Thanks  Antwone Capozzoli [employer], RN,CWOCN, CNS, CWON-AP [phone])  .

## 2020-03-31 NOTE — Brief Op Note (Signed)
03/11/2020 - 04/13/2020  9:42 AM  PATIENT:  Sarah Colon  74 y.o. female  PRE-OPERATIVE DIAGNOSIS:  Symptomatic anemia, cirrhosis  POST-OPERATIVE DIAGNOSIS:  normal  PROCEDURE:  Procedure(s): ESOPHAGOGASTRODUODENOSCOPY (EGD) WITH PROPOFOL with possible band ligation (N/A) BIOPSY  SURGEON:  Surgeon(s) and Role:    * Dontasia Miranda, MD - Primary  Findings ---------- -EGD was normal.  No evidence of bleeding or esophageal varices.  Recommendations ------------------------ -Plan for colonoscopy tomorrow.  Okay to have clear liquid diet today.  Keep n.p.o. past midnight. -Continue to hold anticoagulation -Transfuse 1 unit of PRBC -Repeat labs in the morning -GI will follow  Otis Brace MD, FACP 04/24/2020, 9:43 AM  Contact #  [phone]

## 2020-03-31 NOTE — Plan of Care (Signed)
°  Problem: Health Behavior/Discharge Planning: Goal: Ability to manage health-related needs will improve Outcome: Progressing   Problem: Clinical Measurements: Goal: Ability to maintain clinical measurements within normal limits will improve Outcome: Progressing Goal: Will remain free from infection Outcome: Progressing Goal: Diagnostic test results will improve Outcome: Progressing Goal: Cardiovascular complication will be avoided Outcome: Progressing   Problem: Activity: Goal: Risk for activity intolerance will decrease Outcome: Progressing   Problem: Nutrition: Goal: Adequate nutrition will be maintained Outcome: Progressing   Problem: Coping: Goal: Level of anxiety will decrease Outcome: Progressing   Problem: Elimination: Goal: Will not experience complications related to bowel motility Outcome: Progressing Goal: Will not experience complications related to urinary retention Outcome: Progressing   Problem: Safety: Goal: Ability to remain free from injury will improve Outcome: Progressing   Problem: Skin Integrity: Goal: Risk for impaired skin integrity will decrease Outcome: Progressing

## 2020-03-31 NOTE — Progress Notes (Signed)
PROGRESS NOTE  Sarah Colon JME:[id number] DOB: May 27, 1946 DOA: 03/21/2020 PCP: Patient, No Pcp Per   LOS: 2 days   Brief Narrative / Interim history: 74 year old female with DM 2, diastolic CHF, HTN, paroxysmal A. fib, chronic kidney disease stage IIIb with baseline creatinine 1.5-1.8, came to the ED with shortness of breath, chest pain, fatigue for the past week.  Also reported poor appetite.  She was found to have a hemoglobin of 3 on admission.  No reports of blood in her stool or black tarry stools.  Fecal occult in the ED was positive, GI was consulted and she was admitted to the hospitalist service.  Normally lives in an SNF, nonambulatory and is in a wheelchair  Subjective / 24h Interval events: No complaints today, does not have much recollection of the last day.  Much more alert  Assessment & Plan: Principal Problem Symptomatic anemia, concern for GI bleed -she is on Eliquis for paroxysmal atrial fibrillation, hold for now.  Fecal occult was positive.  Gastroenterology consulted, appreciate input.  She is status post unit of packed red blood cells, hemoglobin still 7.0, will transfuse an additional unit today -EGD today without significant findings as for the source of her GI bleed -Colonoscopy plans for tomorrow per GI.  Active Problems Left lower quadrant abdominal pain -no complaints of pain this morning.  CT scan of the abdomen pelvis without acute findings  Liver cirrhosis - new diagnosis for her, daughter was unaware.  Acute metabolic encephalopathy -likely due to profound anemia, her ammonia was normal yesterday.  Encephalopathy resolved, she appears back to baseline this morning, alert  Acute kidney injury on chronic kidney disease stage IIIb-Baseline creatinine 1.5-1.8, 2.3 on admission likely in the setting of profound anemia, creatinine improving this morning  Paroxysmal A. fib-in sinus now, hold Eliquis, hold diltiazem due to hypotension on admission.  Resume  home metoprolol at the full home dose this morning  Acute on chronic diastolic CHF-appears fluid overloaded with lower extremity edema, BNP is elevated, has pleural effusion on chest x-ray.  Will give another dose of IV Lasix today since she is getting blood  Leukocytosis-at her baseline, seen by hematology as an outpatient regularly  Hypothyroidism-continue Synthroid.  TSH normal  DM2-A1c pending, placed on sliding scale  Pressure Injury Stage III -  Full thickness tissue loss. Subcutaneous fat may be visible but bone, tendon or muscle are NOT exposed. (Active)     Location: Sacrum  Location Orientation: Medial  Staging: Stage III -  Full thickness tissue loss. Subcutaneous fat may be visible but bone, tendon or muscle are NOT exposed.  Wound Description (Comments):   Present on Admission: Yes    Scheduled Meds: . sodium chloride   Intravenous Once  . insulin aspart  0-9 Units Subcutaneous TID WC  . lactulose  10 g Oral BID  . levothyroxine  88 mcg Oral Q0600  . metoprolol tartrate  12.5 mg Oral BID  . pantoprazole  40 mg Oral Daily  . rifaximin  550 mg Oral BID  . sodium chloride flush  3 mL Intravenous Q12H   Continuous Infusions: . sodium chloride    . sodium chloride 20 mL/hr at 03/30/20 1851  . octreotide  (SANDOSTATIN)    IV infusion 50 mcg/hr (03/30/20 2346)   PRN Meds:.sodium chloride, acetaminophen, sodium chloride flush  Diet Orders (From admission, onward)    Start     Ordered   04/25/2020 0001  Diet NPO time specified  Diet effective midnight  03/30/20 1405          DVT prophylaxis: Place TED hose Start: 03/11/2020 2108     Code Status: DNR  Family Communication: no family present, discussed with daughter over the phone  Status is: Inpatient  Remains inpatient appropriate because:Inpatient level of care appropriate due to severity of illness   Dispo: The patient is from: SNF              Anticipated d/c is to: SNF              Anticipated d/c  date is: 3 days              Patient currently is not medically stable to d/c.  Consultants:  GI  Procedures:  None   Microbiology  None   Antimicrobials: None     Objective: Vitals:   03/30/20 1827 03/30/20 2141 04/18/2020 0237 04/02/2020 0607  BP: (!) 144/68 140/63 (!) 140/56 (!) 139/53  Pulse: 78 79 78 79  Resp: 16 20 20 18   Temp: 98.2 F (36.8 C) 98 F (36.7 C) 99.6 F (37.6 C) 98.7 F (37.1 C)  TempSrc: Oral Oral Oral Oral  SpO2: 94% 100% 94% 93%  Weight:      Height:        Intake/Output Summary (Last 24 hours) at 04/11/2020 0819 Last data filed at 04/13/2020 0600 Gross per 24 hour  Intake 752.75 ml  Output 500 ml  Net 252.75 ml   Filed Weights   03/19/2020 1417 03/30/20 0924  Weight: 60.3 kg 60.9 kg    Examination:  Constitutional: No distress Eyes: No icterus ENMT: Moist mucous membranes Neck: normal, supple Respiratory: Clear, no wheezing, no significant crackles, moves air well Cardiovascular: Regular rate and rhythm, no murmurs, 1+ edema Abdomen: Soft, nontender, nondistended, bowel sounds positive Musculoskeletal: no clubbing / cyanosis.  Skin: No rashes seen Neurologic: Nonfocal, equal strength      Data Reviewed: I have independently reviewed following labs and imaging studies   CBC: Recent Labs  Lab 03/27/2020 1455 03/12/2020 1621 03/30/20 0500 03/30/20 1643 04/08/2020 0402  WBC 9.1  --  8.1  --  7.9  NEUTROABS 4.6  --  4.1  --   --   HGB 3.5* 3.1* 7.0* 7.5* 7.0*  HCT 12.1* 10.9* 22.4* 23.2* 22.7*  MCV 66.5*  --  80.0  --  78.8*  PLT 383  --  320  --  229   Basic Metabolic Panel: Recent Labs  Lab 03/04/2020 1455 03/30/20 0500 04/16/2020 0402  NA 135 137 138  K 4.7 4.8 5.5*  CL 108 111 112*  CO2 18* 18* 19*  GLUCOSE 158* 113* 151*  BUN 74* 72* 68*  CREATININE 2.30* 2.22* 2.04*  CALCIUM 8.4* 8.5* 8.6*   Liver Function Tests: Recent Labs  Lab 03/09/2020 1455 03/30/20 0500 04/10/2020 0402  AST 23 19 21   ALT 12 12 12   ALKPHOS  104 97 92  BILITOT 0.4 0.7 0.6  PROT 7.1 6.6 6.2*  ALBUMIN 3.3* 3.1* 3.0*   Coagulation Profile: Recent Labs  Lab 04/20/2020 0402  INR 1.9*   HbA1C: Recent Labs    03/30/20 0500  HGBA1C 5.6   CBG: Recent Labs  Lab 03/30/20 1236 03/30/20 1615 03/30/20 2136 04/26/2020 0743  GLUCAP 101* 121* 156* 160*    Recent Results (from the past 240 hour(s))  Respiratory Panel by RT PCR (Flu A&B, Covid) - Nasopharyngeal Swab     Status: None   Collection Time: 03/07/2020  2:57 PM   Specimen: Nasopharyngeal Swab  Result Value Ref Range Status   SARS Coronavirus 2 by RT PCR NEGATIVE NEGATIVE Final    Comment: (NOTE) SARS-CoV-2 target nucleic acids are NOT DETECTED.  The SARS-CoV-2 RNA is generally detectable in upper respiratoy specimens during the acute phase of infection. The lowest concentration of SARS-CoV-2 viral copies this assay can detect is 131 copies/mL. A negative result does not preclude SARS-Cov-2 infection and should not be used as the sole basis for treatment or other patient management decisions. A negative result may occur with  improper specimen collection/handling, submission of specimen other than nasopharyngeal swab, presence of viral mutation(s) within the areas targeted by this assay, and inadequate number of viral copies (<131 copies/mL). A negative result must be combined with clinical observations, patient history, and epidemiological information. The expected result is Negative.  Fact Sheet for Patients:  PinkCheek.be  Fact Sheet for Healthcare Providers:  GravelBags.it  This test is no t yet approved or cleared by the [place] FDA and  has been authorized for detection and/or diagnosis of SARS-CoV-2 by FDA under an Emergency Use Authorization (EUA). This EUA will remain  in effect (meaning this test can be used) for the duration of the COVID-19 declaration under Section 564(b)(1) of the Act,  21 U.S.C. section 360bbb-3(b)(1), unless the authorization is terminated or revoked sooner.     Influenza A by PCR NEGATIVE NEGATIVE Final   Influenza B by PCR NEGATIVE NEGATIVE Final    Comment: (NOTE) The Xpert Xpress SARS-CoV-2/FLU/RSV assay is intended as an aid in  the diagnosis of influenza from Nasopharyngeal swab specimens and  should not be used as a sole basis for treatment. Nasal washings and  aspirates are unacceptable for Xpert Xpress SARS-CoV-2/FLU/RSV  testing.  Fact Sheet for Patients: PinkCheek.be  Fact Sheet for Healthcare Providers: GravelBags.it  This test is not yet approved or cleared by the [place] FDA and  has been authorized for detection and/or diagnosis of SARS-CoV-2 by  FDA under an Emergency Use Authorization (EUA). This EUA will remain  in effect (meaning this test can be used) for the duration of the  Covid-19 declaration under Section 564(b)(1) of the Act, 21  U.S.C. section 360bbb-3(b)(1), unless the authorization is  terminated or revoked. Performed at [facility], [place] [address], [place], [place] [address]      Radiology Studies: ECHOCARDIOGRAM COMPLETE  Result Date: 03/30/2020    ECHOCARDIOGRAM REPORT   Patient Name:   ANTIONETTE LUSTER Date of Exam: 03/30/2020 Medical Rec #:  [id number]        Height:       61.5 in Accession #:    [id number]       Weight:       134.3 lb Date of Birth:  December 05, 1945        BSA:          1.604 m Patient Age:    88 years         BP:           136/51 mmHg Patient Gender: F                HR:           79 bpm. Exam Location:  Inpatient Procedure: 2D Echo and Intracardiac Opacification Agent Indications:    CHF-Acute Diastolic O70.96  History:        Patient has prior history of Echocardiogram examinations, most  recent 05/30/2018. Risk Factors:Hypertension and Diabetes.  Sonographer:    Mikki Santee RDCS (AE) Referring  Phys: [id number] [place]  1. Left ventricular ejection fraction, by estimation, is 65 to 70%. The left ventricle has normal function. The left ventricle has no regional wall motion abnormalities. There is mild concentric left ventricular hypertrophy. Left ventricular diastolic parameters are consistent with Grade I diastolic dysfunction (impaired relaxation). Elevated left ventricular end-diastolic pressure.  2. Right ventricular systolic function is normal. The right ventricular size is normal. There is moderately elevated pulmonary artery systolic pressure. The estimated right ventricular systolic pressure is 14.7 mmHg.  3. The mitral valve is normal in structure. Mild mitral valve regurgitation. No evidence of mitral stenosis. Moderate mitral annular calcification.  4. Tricuspid valve regurgitation is mild to moderate.  5. The aortic valve is tricuspid. Aortic valve regurgitation is not visualized. Mild to moderate aortic valve sclerosis/calcification is present, without any evidence of aortic stenosis.  6. The inferior vena cava is dilated in size with >50% respiratory variability, suggesting right atrial pressure of 8 mmHg. FINDINGS  Left Ventricle: Left ventricular ejection fraction, by estimation, is 65 to 70%. The left ventricle has normal function. The left ventricle has no regional wall motion abnormalities. Definity contrast agent was given IV to delineate the left ventricular  endocardial borders. The left ventricular internal cavity size was normal in size. There is mild concentric left ventricular hypertrophy. Left ventricular diastolic parameters are consistent with Grade I diastolic dysfunction (impaired relaxation). Elevated left ventricular end-diastolic pressure. Right Ventricle: The right ventricular size is normal. No increase in right ventricular wall thickness. Right ventricular systolic function is normal. There is moderately elevated pulmonary artery systolic pressure. The  tricuspid regurgitant velocity is 3.34 m/s, and with an assumed right atrial pressure of 8 mmHg, the estimated right ventricular systolic pressure is 82.9 mmHg. Left Atrium: Left atrial size was normal in size. Right Atrium: Right atrial size was normal in size. Pericardium: There is no evidence of pericardial effusion. Mitral Valve: The mitral valve is normal in structure. Moderate mitral annular calcification. Mild mitral valve regurgitation. No evidence of mitral valve stenosis. Tricuspid Valve: The tricuspid valve is normal in structure. Tricuspid valve regurgitation is mild to moderate. No evidence of tricuspid stenosis. Aortic Valve: The aortic valve is tricuspid. Aortic valve regurgitation is not visualized. Mild to moderate aortic valve sclerosis/calcification is present, without any evidence of aortic stenosis. Pulmonic Valve: The pulmonic valve was normal in structure. Pulmonic valve regurgitation is trivial. No evidence of pulmonic stenosis. Aorta: The aortic root is normal in size and structure. Venous: The inferior vena cava is dilated in size with greater than 50% respiratory variability, suggesting right atrial pressure of 8 mmHg. IAS/Shunts: The interatrial septum appears to be lipomatous. No atrial level shunt detected by color flow Doppler.  LEFT VENTRICLE PLAX 2D LVIDd:         4.00 cm  Diastology LVIDs:         2.20 cm  LV e' medial:    4.05 cm/s LV PW:         1.20 cm  LV E/e' medial:  28.1 LV IVS:        1.30 cm  LV e' lateral:   4.78 cm/s LVOT diam:     1.70 cm  LV E/e' lateral: 23.8 LV SV:         68 LV SV Index:   43 LVOT Area:     2.27 cm  RIGHT VENTRICLE RV  S prime:     10.40 cm/s TAPSE (M-mode): 1.9 cm LEFT ATRIUM             Index       RIGHT ATRIUM           Index LA diam:        3.40 cm 2.12 cm/m  RA Area:     12.90 cm LA Vol (A2C):   50.9 ml 31.73 ml/m RA Volume:   32.70 ml  20.38 ml/m LA Vol (A4C):   45.3 ml 28.24 ml/m LA Biplane Vol: 52.1 ml 32.47 ml/m  AORTIC VALVE LVOT  Vmax:   110.00 cm/s LVOT Vmean:  68.700 cm/s LVOT VTI:    0.301 m  AORTA Ao Root diam: 2.40 cm MITRAL VALVE                TRICUSPID VALVE MV Area (PHT): 2.16 cm     TR Peak grad:   44.6 mmHg MV Decel Time: 352 msec     TR Vmax:        334.00 cm/s MV E velocity: 114.00 cm/s MV A velocity: 136.00 cm/s  SHUNTS MV E/A ratio:  0.84         Systemic VTI:  0.30 m                             Systemic Diam: 1.70 cm Fransico Him MD Electronically signed by Fransico Him MD Signature Date/Time: 03/30/2020/12:58:42 PM    Final     Marzetta Board, MD, PhD [facility]  Between 7 am - 7 pm I am available, please contact me via Amion or Securechat  Between 7 pm - 7 am I am not available, please contact night coverage MD/APP via Amion

## 2020-03-31 NOTE — Transfer of Care (Signed)
Immediate Anesthesia Transfer of Care Note  Patient: Sarah Colon  Procedure(s) Performed: ESOPHAGOGASTRODUODENOSCOPY (EGD) WITH PROPOFOL with possible band ligation (N/A ) BIOPSY  Patient Location: PACU and Endoscopy Unit  Anesthesia Type:MAC  Level of Consciousness: awake, alert , oriented and patient cooperative  Airway & Oxygen Therapy: Patient Spontanous Breathing and Patient connected to face mask oxygen  Post-op Assessment: Report given to RN and Post -op Vital signs reviewed and stable  Post vital signs: Reviewed and stable  Last Vitals:  Vitals Value Taken Time  BP 133/47 04/25/2020 0947  Temp 37.1 C 04/24/2020 0945  Pulse 82 04/13/2020 0949  Resp 19 04/14/2020 0949  SpO2 100 % 04/24/2020 0949  Vitals shown include unvalidated device data.  Last Pain:  Vitals:   04/24/2020 0945  TempSrc: Oral  PainSc: 0-No pain      Patients Stated Pain Goal: 2 (67/12/45 8099)  Complications: No complications documented.

## 2020-03-31 NOTE — Plan of Care (Signed)
  Problem: Clinical Measurements: Goal: Cardiovascular complication will be avoided Outcome: Progressing   Problem: Activity: Goal: Risk for activity intolerance will decrease Outcome: Progressing   Problem: Nutrition: Goal: Adequate nutrition will be maintained Outcome: Progressing   Problem: Education: Goal: Knowledge of General Education information will improve Description: Including pain rating scale, medication(s)/side effects and non-pharmacologic comfort measures Outcome: Completed/Met   Problem: Clinical Measurements: Goal: Respiratory complications will improve Outcome: Completed/Met   Problem: Pain Managment: Goal: General experience of comfort will improve Outcome: Completed/Met

## 2020-03-31 NOTE — Anesthesia Postprocedure Evaluation (Signed)
Anesthesia Post Note  Patient: Sarah Colon  Procedure(s) Performed: ESOPHAGOGASTRODUODENOSCOPY (EGD) WITH PROPOFOL with possible band ligation (N/A ) BIOPSY     Patient location during evaluation: Endoscopy Anesthesia Type: MAC Level of consciousness: awake and alert Pain management: pain level controlled Vital Signs Assessment: post-procedure vital signs reviewed and stable Respiratory status: spontaneous breathing, nonlabored ventilation, respiratory function stable and patient connected to nasal cannula oxygen Cardiovascular status: stable and blood pressure returned to baseline Postop Assessment: no apparent nausea or vomiting Anesthetic complications: no   No complications documented.  Last Vitals:  Vitals:   04/22/2020 2047 04/11/2020 2050  BP: (!) 158/83 (!) 158/83  Pulse: (!) 106 (!) 106  Resp:  18  Temp:  37.1 C  SpO2:  95%    Last Pain:  Vitals:   04/16/2020 2050  TempSrc: Oral  PainSc:                  Keerthana Vanrossum P Tempestt Silba

## 2020-03-31 NOTE — Op Note (Signed)
[facility] Patient Name: Sarah Colon Procedure Date: 04/28/2020 MRN: [id number] Attending MD: Otis Brace , MD Date of Birth: Jun 12, 1945 CSN: [id number] Age: 74 Admit Type: Outpatient Procedure:                Upper GI endoscopy Indications:              Iron deficiency anemia, Heme positive stool Providers:                Otis Brace, MD, Cleda Daub, RN, Fransico Setters                            [place], Technician Referring MD:              Medicines:                Sedation Administered by an Anesthesia Professional Complications:            No immediate complications. Estimated Blood Loss:     Estimated blood loss was minimal. Procedure:                Pre-Anesthesia Assessment:                           - Prior to the procedure, a History and Physical                            was performed, and patient medications and                            allergies were reviewed. The patient's tolerance of                            previous anesthesia was also reviewed. The risks                            and benefits of the procedure and the sedation                            options and risks were discussed with the patient.                            All questions were answered, and informed consent                            was obtained. Prior Anticoagulants: The patient has                            taken Xarelto (rivaroxaban), last dose was 2 days                            prior to procedure. ASA Grade Assessment: III - A                            patient with severe systemic disease. After  reviewing the risks and benefits, the patient was                            deemed in satisfactory condition to undergo the                            procedure.                           After obtaining informed consent, the endoscope was                            passed under direct vision. Throughout the                             procedure, the patient's blood pressure, pulse, and                            oxygen saturations were monitored continuously. The                            [id number]-H190 ([id number] Olympus gastroscope was                            introduced through the mouth, and advanced to the                            second part of duodenum. The upper GI endoscopy was                            accomplished without difficulty. The patient                            tolerated the procedure well. Scope In: Scope Out: Findings:      The Z-line was regular and was found 38 cm from the incisors.      The exam of the esophagus was otherwise normal.      Normal mucosa was found in the entire examined stomach. Biopsies were       taken with a cold forceps for histology.      The cardia and gastric fundus were normal on retroflexion.      The duodenal bulb, first portion of the duodenum and second portion of       the duodenum were normal. Impression:               - Z-line regular, 38 cm from the incisors.                           - Normal mucosa was found in the entire stomach.                            Biopsied.                           - Normal duodenal bulb, first portion of the  duodenum and second portion of the duodenum. Moderate Sedation:      Moderate (conscious) sedation was personally administered by an       anesthesia professional. The following parameters were monitored: oxygen       saturation, heart rate, blood pressure, and response to care. Recommendation:           - Return patient to hospital ward for ongoing care.                           - Clear liquid diet.                           - Continue present medications.                           - Perform a colonoscopy tomorrow. Procedure Code(s):        --- Professional ---                           [id number], Esophagogastroduodenoscopy, flexible,                            transoral; with biopsy, single or  multiple Diagnosis Code(s):        --- Professional ---                           D50.9, Iron deficiency anemia, unspecified                           R19.5, Other fecal abnormalities CPT copyright 2019 [employer]. All rights reserved. The codes documented in this report are preliminary and upon coder review may  be revised to meet current compliance requirements. Otis Brace, MD Otis Brace, MD 04/28/2020 9:41:12 AM Number of Addenda: 0

## 2020-03-31 NOTE — Progress Notes (Signed)
Call received from lab. MRSA screen positive. Pt already on contact precautions. Kaedynce Tapp, Laurel Dimmer, RN

## 2020-03-31 NOTE — Consult Note (Signed)
I have placed a request via Secure Chat to [facility] requesting photos of the wound areas of concern to be placed in the EMR.   [place], [place], [place]

## 2020-03-31 NOTE — Progress Notes (Signed)
[facility] Progress Note  Sarah Colon 74 y.o. December 29, 1945  CC: Symptomatic anemia, heme positive stool   Subjective: Patient seen and examined at bedside in endoscopy unit.  She is alert and oriented today.  Able to give appropriate history.  Denies any bleeding episodes.  Denies abdominal pain, nausea and vomiting.  ROS : Afebrile.  Negative for chest pain   Objective: Vital signs in last 24 hours: Vitals:   04/22/2020 0607 04/28/2020 0842  BP: (!) 139/53 (!) 169/71  Pulse: 79 (!) 114  Resp: 18 (!) 23  Temp: 98.7 F (37.1 C) 98.9 F (37.2 C)  SpO2: 93% 93%    Physical Exam:  General:   Elderly patient.  Not in acute distress  Head:  Normocephalic, without obvious abnormality, atraumatic  Eyes:  , EOM's intact, anicteric sclera  Lungs:    No visible respiratory distress  Heart:   Tachycardia noted  Abdomen:   Soft, non-tender, nondistended, bowel sounds present, no peritoneal signs  Extremities: Extremities normal, atraumatic, no  edema       Lab Results: Recent Labs    03/30/20 0500 04/20/2020 0402  NA 137 138  K 4.8 5.5*  CL 111 112*  CO2 18* 19*  GLUCOSE 113* 151*  BUN 72* 68*  CREATININE 2.22* 2.04*  CALCIUM 8.5* 8.6*   Recent Labs    03/30/20 0500 04/29/2020 0402  AST 19 21  ALT 12 12  ALKPHOS 97 92  BILITOT 0.7 0.6  PROT 6.6 6.2*  ALBUMIN 3.1* 3.0*   Recent Labs    02/29/2020 1455 03/22/2020 1621 03/30/20 0500 03/30/20 0500 03/30/20 1643 04/18/2020 0402  WBC 9.1   < > 8.1  --   --  7.9  NEUTROABS 4.6  --  4.1  --   --   --   HGB 3.5*   < > 7.0*   < > 7.5* 7.0*  HCT 12.1*   < > 22.4*   < > 23.2* 22.7*  MCV 66.5*   < > 80.0  --   --  78.8*  PLT 383   < > 320  --   --  300   < > = values in this interval not displayed.   Recent Labs    04/01/2020 0402  LABPROT 21.5*  INR 1.9*      Assessment/Plan: -Symptomatic anemia with hemoglobin of 3.5 on admission.  Hemoglobin improved to 7 after  blood transfusion.  Occult blood  positive.  No overt bleeding. -Cirrhosis of the liver.  Based on CT scan findings.  Hepatitis panel negative in 2014. - hepatic encephalopathy.  Improving -Acute on chronic kidney injury.  Improving. -Abnormal CT scan concerning for enlarged lymph nodes.  Recommendations ----------------------- - Proceed with EGD today with possible band ligation.  If EGD negative, she will need colonoscopy.  This was discussed with the patient.  She verbalized understanding.  -Check AFP. - abdominal MRI with and without IV gadolinium depending on creatinine prior to discharge.  -GI will follow  Risks (bleeding, infection, bowel perforation that could require surgery, sedation-related changes in cardiopulmonary systems), benefits (identification and possible treatment of source of symptoms, exclusion of certain causes of symptoms), and alternatives (watchful waiting, radiographic imaging studies, empiric medical treatment)  were explained to patient/family in detail and patient wishes to proceed.  Otis Brace MD, [place] 04/19/2020, 8:46 AM  Contact #  [phone]

## 2020-03-31 DEATH — deceased

## 2020-04-01 ENCOUNTER — Other Ambulatory Visit: Payer: Self-pay

## 2020-04-01 ENCOUNTER — Inpatient Hospital Stay (HOSPITAL_COMMUNITY): Payer: Medicare Other

## 2020-04-01 DIAGNOSIS — D649 Anemia, unspecified: Secondary | ICD-10-CM | POA: Diagnosis not present

## 2020-04-01 DIAGNOSIS — E039 Hypothyroidism, unspecified: Secondary | ICD-10-CM | POA: Diagnosis not present

## 2020-04-01 DIAGNOSIS — I5033 Acute on chronic diastolic (congestive) heart failure: Secondary | ICD-10-CM | POA: Diagnosis not present

## 2020-04-01 DIAGNOSIS — E1159 Type 2 diabetes mellitus with other circulatory complications: Secondary | ICD-10-CM | POA: Diagnosis not present

## 2020-04-01 LAB — SURGICAL PATHOLOGY

## 2020-04-01 LAB — HEMOGLOBIN AND HEMATOCRIT, BLOOD
HCT: 32.4 % — ABNORMAL LOW (ref 36.0–46.0)
Hemoglobin: 9.8 g/dL — ABNORMAL LOW (ref 12.0–15.0)

## 2020-04-01 LAB — BASIC METABOLIC PANEL
Anion gap: 7 (ref 5–15)
BUN: 55 mg/dL — ABNORMAL HIGH (ref 8–23)
CO2: 20 mmol/L — ABNORMAL LOW (ref 22–32)
Calcium: 8.9 mg/dL (ref 8.9–10.3)
Chloride: 110 mmol/L (ref 98–111)
Creatinine, Ser: 1.76 mg/dL — ABNORMAL HIGH (ref 0.44–1.00)
GFR, Estimated: 30 mL/min — ABNORMAL LOW (ref >60)
Glucose, Bld: 138 mg/dL — ABNORMAL HIGH (ref 70–99)
Potassium: 5.5 mmol/L — ABNORMAL HIGH (ref 3.5–5.1)
Sodium: 137 mmol/L (ref 135–145)

## 2020-04-01 LAB — CBC
HCT: 30.1 % — ABNORMAL LOW (ref 36.0–46.0)
Hemoglobin: 9.5 g/dL — ABNORMAL LOW (ref 12.0–15.0)
MCH: 25.9 pg — ABNORMAL LOW (ref 26.0–34.0)
MCHC: 31.6 g/dL (ref 30.0–36.0)
MCV: 82 fL (ref 80.0–100.0)
Platelets: 321 10*3/uL (ref 150–400)
RBC: 3.67 MIL/uL — ABNORMAL LOW (ref 3.87–5.11)
RDW: 26.8 % — ABNORMAL HIGH (ref 11.5–15.5)
WBC: 9.4 10*3/uL (ref 4.0–10.5)
nRBC: 0.5 % — ABNORMAL HIGH (ref 0.0–0.2)

## 2020-04-01 LAB — GLUCOSE, CAPILLARY
Glucose-Capillary: 141 mg/dL — ABNORMAL HIGH (ref 70–99)
Glucose-Capillary: 122 mg/dL — ABNORMAL HIGH (ref 70–99)
Glucose-Capillary: 135 mg/dL — ABNORMAL HIGH (ref 70–99)
Glucose-Capillary: 118 mg/dL — ABNORMAL HIGH (ref 70–99)

## 2020-04-01 LAB — AFP TUMOR MARKER: AFP, Serum, Tumor Marker: 1.5 ng/mL (ref 0.0–8.3)

## 2020-04-01 MED ORDER — POLYETHYLENE GLYCOL 3350 17 G PO PACK
17.0000 g | PACK | Freq: Three times a day (TID) | ORAL | Status: DC
  Administered 2020-04-01 – 2020-04-10 (×9): 17 g via ORAL
  Filled 2020-04-01 (×16): qty 1

## 2020-04-01 MED ORDER — DILTIAZEM HCL ER COATED BEADS 120 MG PO CP24
120.0000 mg | ORAL_CAPSULE | Freq: Every day | ORAL | Status: DC
  Administered 2020-04-01 – 2020-04-25 (×25): 120 mg via ORAL
  Filled 2020-04-01 (×26): qty 1

## 2020-04-01 MED ORDER — GADOBUTROL 1 MMOL/ML IV SOLN
6.0000 mL | Freq: Once | INTRAVENOUS | Status: AC | PRN
  Administered 2020-04-01: 6 mL via INTRAVENOUS

## 2020-04-01 MED ORDER — FLEET ENEMA 7-19 GM/118ML RE ENEM: 2.0000 | ENEMA | Freq: Once | RECTAL | Status: DC

## 2020-04-01 NOTE — Progress Notes (Signed)
PROGRESS NOTE  Sarah Colon EZM:[id number] DOB: February 25, 1946 DOA: 03/22/2020 PCP: Patient, No Pcp Per   LOS: 3 days   Brief Narrative / Interim history: 74 year old female with DM 2, diastolic CHF, HTN, paroxysmal A. fib, chronic kidney disease stage IIIb with baseline creatinine 1.5-1.8, came to the ED with shortness of breath, chest pain, fatigue for the past week.  Also reported poor appetite.  She was found to have a hemoglobin of 3 on admission.  No reports of blood in her stool or black tarry stools.  Fecal occult in the ED was positive, GI was consulted and she was admitted to the hospitalist service.  Normally lives in an SNF, nonambulatory and is in a wheelchair  Subjective / 24h Interval events: Was unable to drink the prep last night due to nausea  Assessment & Plan: Principal Problem Symptomatic anemia, concern for GI bleed -she is on Eliquis for paroxysmal atrial fibrillation, hold for now.  Fecal occult was positive.  Gastroenterology consulted, appreciate input.  Received a total of 4 units of packed red blood cells, hemoglobin has improved from 3.1 on admission to 9.5 today. -Underwent an EGD 11/1 without significant findings as for the source of her GI bleed -Colonoscopy was attempted today however patient could not do the prep.  She is willing to try again.  Reschedule colonoscopy tentatively for tomorrow  Active Problems Left lower quadrant abdominal pain -no complaints of pain this morning.  CT scan of the abdomen pelvis without acute findings  Liver cirrhosis - new diagnosis for her, daughter was unaware.  MRI of the abdomen per GI  Acute metabolic encephalopathy -likely due to profound anemia, her ammonia was normal yesterday.  Her encephalopathy resolved, she is back to baseline, alert, appropriate  Acute kidney injury on chronic kidney disease stage IIIb-Baseline creatinine 1.5-1.8, 2.3 on admission likely in the setting of profound anemia, creatinine has returned  to baseline today  Paroxysmal A. fib-in sinus now, hold Eliquis, her diltiazem was held on admission due to hypotension, her blood pressure is creeping back up, add back diltiazem in addition to her home metoprolol  Acute on chronic diastolic CHF-appears fluid overloaded with lower extremity edema, BNP is elevated, has pleural effusion on chest x-ray.  She is on room air, fluid status looks a little bit better.  Hold further diuresis for now   Leukocytosis-at her baseline, seen by hematology as an outpatient regularly  Hypothyroidism-continue Synthroid.  TSH normal  DM2-A1c pending, placed on sliding scale  Pressure Injury 04/27/2020 Sacrum Medial Stage III -  Full thickness tissue loss. Subcutaneous fat may be visible but bone, tendon or muscle are NOT exposed. (Active)  04/08/2020 1149  Location: Sacrum  Location Orientation: Medial  Staging: Stage III -  Full thickness tissue loss. Subcutaneous fat may be visible but bone, tendon or muscle are NOT exposed.  Wound Description (Comments):   Present on Admission: Yes    Scheduled Meds:  Chlorhexidine Gluconate Cloth  6 each Topical Q0600   insulin aspart  0-9 Units Subcutaneous TID WC   lactulose  10 g Oral BID   levothyroxine  88 mcg Oral Q0600   metoprolol tartrate  25 mg Oral BID   mupirocin ointment  1 application Nasal BID   pantoprazole  40 mg Oral Daily   polyethylene glycol  17 g Oral Q8H   rifaximin  550 mg Oral BID   sodium chloride flush  3 mL Intravenous Q12H   Continuous Infusions:  sodium chloride Stopped (  04/01/20 0906)   PRN Meds:.sodium chloride, acetaminophen, ondansetron (ZOFRAN) IV, sodium chloride flush  Diet Orders (From admission, onward)    Start     Ordered   04/13/2020 0001  Diet NPO time specified  Diet effective midnight        04/01/20 0837   04/01/20 0838  Diet clear liquid Room service appropriate? Yes; Fluid consistency: Thin  Diet effective now       Question Answer Comment  Room  service appropriate? Yes   Fluid consistency: Thin      04/01/20 0837          DVT prophylaxis: Place TED hose Start: 03/08/2020 2108     Code Status: DNR  Family Communication: no family present, discussed with daughter over the phone  Status is: Inpatient  Remains inpatient appropriate because:Inpatient level of care appropriate due to severity of illness   Dispo: The patient is from: SNF              Anticipated d/c is to: SNF              Anticipated d/c date is: 3 days              Patient currently is not medically stable to d/c.  Consultants:  GI  Procedures:  None   Microbiology  None   Antimicrobials: None     Objective: Vitals:   04/04/2020 2047 04/27/2020 2050 04/15/2020 2253 04/01/20 0532  BP: (!) 158/83 (!) 158/83 134/67 (!) 158/74  Pulse: (!) 106 (!) 106 99 (!) 109  Resp:  18 18 18   Temp:  98.7 F (37.1 C) 99.4 F (37.4 C) 98.9 F (37.2 C)  TempSrc:  Oral Oral Oral  SpO2:  95% 95% 94%  Weight:    61.4 kg  Height:        Intake/Output Summary (Last 24 hours) at 04/01/2020 1142 Last data filed at 04/01/2020 6948 Gross per 24 hour  Intake 1924.93 ml  Output 700 ml  Net 1224.93 ml   Filed Weights   03/09/2020 1417 03/30/20 0924 04/01/20 0532  Weight: 60.3 kg 60.9 kg 61.4 kg    Examination:  Constitutional: No distress, in bed Eyes: No scleral icterus ENMT: Moist mucous membranes Neck: normal, supple Respiratory: Clear to auscultation without wheezing, no crackles, moves air well Cardiovascular: Regular rate and rhythm, no murmurs appreciated, 1+ edema Abdomen: Soft, nontender, nondistended, bowel sounds positive Musculoskeletal: no clubbing / cyanosis.  Skin: No rashes appreciated, sacral decubitus lesions as below Neurologic: No focal deficits, equal strength      Data Reviewed: I have independently reviewed following labs and imaging studies   CBC: Recent Labs  Lab 03/17/2020 1455 03/02/2020 1621 03/30/20 0500 03/30/20 1643  04/29/2020 0402 04/21/2020 1901 04/01/20 0410  WBC 9.1  --  8.1  --  7.9  --  9.4  NEUTROABS 4.6  --  4.1  --   --   --   --   HGB 3.5*   < > 7.0* 7.5* 7.0* 9.1* 9.5*  HCT 12.1*   < > 22.4* 23.2* 22.7* 29.5* 30.1*  MCV 66.5*  --  80.0  --  78.8*  --  82.0  PLT 383  --  320  --  300  --  321   < > = values in this interval not displayed.   Basic Metabolic Panel: Recent Labs  Lab 03/30/2020 1455 03/30/20 0500 04/07/2020 0402 04/01/20 0410  NA 135 137 138 137  K 4.7  4.8 5.5* 5.5*  CL 108 111 112* 110  CO2 18* 18* 19* 20*  GLUCOSE 158* 113* 151* 138*  BUN 74* 72* 68* 55*  CREATININE 2.30* 2.22* 2.04* 1.76*  CALCIUM 8.4* 8.5* 8.6* 8.9   Liver Function Tests: Recent Labs  Lab 03/10/2020 1455 03/30/20 0500 04/27/2020 0402  AST 23 19 21   ALT 12 12 12   ALKPHOS 104 97 92  BILITOT 0.4 0.7 0.6  PROT 7.1 6.6 6.2*  ALBUMIN 3.3* 3.1* 3.0*   Coagulation Profile: Recent Labs  Lab 04/02/2020 0402  INR 1.9*   HbA1C: Recent Labs    03/30/20 0500  HGBA1C 5.6   CBG: Recent Labs  Lab 04/27/2020 0854 04/25/2020 1150 04/20/2020 1734 04/18/2020 2123 04/01/20 0747  GLUCAP 157* 124* 151* 129* 141*    Recent Results (from the past 240 hour(s))  Respiratory Panel by RT PCR (Flu A&B, Covid) - Nasopharyngeal Swab     Status: None   Collection Time: 03/13/2020  2:57 PM   Specimen: Nasopharyngeal Swab  Result Value Ref Range Status   SARS Coronavirus 2 by RT PCR NEGATIVE NEGATIVE Final    Comment: (NOTE) SARS-CoV-2 target nucleic acids are NOT DETECTED.  The SARS-CoV-2 RNA is generally detectable in upper respiratoy specimens during the acute phase of infection. The lowest concentration of SARS-CoV-2 viral copies this assay can detect is 131 copies/mL. A negative result does not preclude SARS-Cov-2 infection and should not be used as the sole basis for treatment or other patient management decisions. A negative result may occur with  improper specimen collection/handling, submission of specimen  other than nasopharyngeal swab, presence of viral mutation(s) within the areas targeted by this assay, and inadequate number of viral copies (<131 copies/mL). A negative result must be combined with clinical observations, patient history, and epidemiological information. The expected result is Negative.  Fact Sheet for Patients:  PinkCheek.be  Fact Sheet for Healthcare Providers:  GravelBags.it  This test is no t yet approved or cleared by the [place] FDA and  has been authorized for detection and/or diagnosis of SARS-CoV-2 by FDA under an Emergency Use Authorization (EUA). This EUA will remain  in effect (meaning this test can be used) for the duration of the COVID-19 declaration under Section 564(b)(1) of the Act, 21 U.S.C. section 360bbb-3(b)(1), unless the authorization is terminated or revoked sooner.     Influenza A by PCR NEGATIVE NEGATIVE Final   Influenza B by PCR NEGATIVE NEGATIVE Final    Comment: (NOTE) The Xpert Xpress SARS-CoV-2/FLU/RSV assay is intended as an aid in  the diagnosis of influenza from Nasopharyngeal swab specimens and  should not be used as a sole basis for treatment. Nasal washings and  aspirates are unacceptable for Xpert Xpress SARS-CoV-2/FLU/RSV  testing.  Fact Sheet for Patients: PinkCheek.be  Fact Sheet for Healthcare Providers: GravelBags.it  This test is not yet approved or cleared by the [place] FDA and  has been authorized for detection and/or diagnosis of SARS-CoV-2 by  FDA under an Emergency Use Authorization (EUA). This EUA will remain  in effect (meaning this test can be used) for the duration of the  Covid-19 declaration under Section 564(b)(1) of the Act, 21  U.S.C. section 360bbb-3(b)(1), unless the authorization is  terminated or revoked. Performed at [facility], [place] [address], [place], [place] [address]   MRSA PCR Screening     Status: Abnormal   Collection Time: 04/05/2020  7:44 AM   Specimen: Nasopharyngeal  Result Value Ref  Range Status   MRSA by PCR POSITIVE (A) NEGATIVE Final    Comment:        The GeneXpert MRSA Assay (FDA approved for NASAL specimens only), is one component of a comprehensive MRSA colonization surveillance program. It is not intended to diagnose MRSA infection nor to guide or monitor treatment for MRSA infections. RESULT CALLED TO, READ BACK BY AND VERIFIED WITH: DARK,D. RN @1111  ON 11.01.2021 BY COHEN,K Performed at [facility], [place] [address] [place], [place] [address]      Radiology Studies: No results found.  Marzetta Board, MD, PhD [facility]  Between 7 am - 7 pm I am available, please contact me via Amion or Securechat  Between 7 pm - 7 am I am not available, please contact night coverage MD/APP via Amion

## 2020-04-01 NOTE — Progress Notes (Signed)
Upon handoff patient encouraged to drink another glass of golytely, at this time patient is refusing, reminded she has a procedure scheduled for Tuesday morning, patient aware.  Will try again later.

## 2020-04-01 NOTE — Plan of Care (Signed)
  Problem: Health Behavior/Discharge Planning: Goal: Ability to manage health-related needs will improve Outcome: Progressing   Problem: Clinical Measurements: Goal: Ability to maintain clinical measurements within normal limits will improve Outcome: Progressing Goal: Will remain free from infection Outcome: Progressing Goal: Diagnostic test results will improve Outcome: Progressing Goal: Cardiovascular complication will be avoided Outcome: Progressing   Problem: Activity: Goal: Risk for activity intolerance will decrease Outcome: Progressing   Problem: Nutrition: Goal: Adequate nutrition will be maintained Outcome: Progressing   Problem: Coping: Goal: Level of anxiety will decrease Outcome: Progressing   Problem: Elimination: Goal: Will not experience complications related to bowel motility Outcome: Progressing Goal: Will not experience complications related to urinary retention Outcome: Progressing   Problem: Safety: Goal: Ability to remain free from injury will improve Outcome: Progressing   Problem: Skin Integrity: Goal: Risk for impaired skin integrity will decrease Outcome: Progressing

## 2020-04-01 NOTE — TOC Progression Note (Signed)
Transition of Care [facility]) - Progression Note    Patient Details  Name: Sarah Colon MRN: [id number] Date of Birth: 11-26-1945  Transition of Care [facility] CM/SW Contact  Ross Ludwig, [place] Phone Number: 04/01/2020, 12:20 PM  Clinical Narrative:     CSW was informed that patient is from [name].  He is a long term care resident at [facility].  CSW to continue to facilitate discharge planning.     Expected Discharge Plan and Services  Patient will be discharging back to SNF.                                               Social Determinants of Health (SDOH) Interventions    Readmission Risk Interventions No flowsheet data found.

## 2020-04-01 NOTE — Plan of Care (Signed)
  Problem: Clinical Measurements: Goal: Diagnostic test results will improve Outcome: Progressing   Problem: Activity: Goal: Risk for activity intolerance will decrease Outcome: Not Progressing   Problem: Nutrition: Goal: Adequate nutrition will be maintained Outcome: Not Progressing

## 2020-04-01 NOTE — Progress Notes (Signed)
Notified Dr. Cruzita Lederer and Dr. Alessandra Bevels that pt continues to refuse Golytely prep.  Will continue to encourage pt to take prep. Order received to give fleet enema x 2 if pt does not take prep. Yisell Sprunger, Laurel Dimmer, RN

## 2020-04-01 NOTE — Progress Notes (Signed)
Patient aroused again, accepted meds whole without difficulty.  Informed patient that she needed to drink another glass of golytely, still refusing.

## 2020-04-01 NOTE — Progress Notes (Signed)
[facility] Progress Note  Sarah Colon 74 y.o. 06-30-1945  CC: Symptomatic anemia, heme positive stool   Subjective: Patient seen and examined at bedside.  She only drank 2 glasses of her prep.  She denies any bleeding episodes.  Denies abdominal pain.  She was not able to tolerate prep because of nausea.   ROS : Afebrile.  Negative for chest pain   Objective: Vital signs in last 24 hours: Vitals:   04/14/2020 2253 04/01/20 0532  BP: 134/67 (!) 158/74  Pulse: 99 (!) 109  Resp: 18 18  Temp: 99.4 F (37.4 C) 98.9 F (37.2 C)  SpO2: 95% 94%    Physical Exam:  General:   Elderly patient.  Not in acute distress  Head:  Normocephalic, without obvious abnormality, atraumatic  Eyes:  , EOM's intact, anicteric sclera  Lungs:    No visible respiratory distress  Heart:   Tachycardia noted  Abdomen:   Soft, non-tender, nondistended, bowel sounds present, no peritoneal signs  Extremities: Extremities normal, atraumatic, no  edema       Lab Results: Recent Labs    03/30/20 0500 04/20/2020 0402  NA 137 138  K 4.8 5.5*  CL 111 112*  CO2 18* 19*  GLUCOSE 113* 151*  BUN 72* 68*  CREATININE 2.22* 2.04*  CALCIUM 8.5* 8.6*   Recent Labs    03/30/20 0500 04/24/2020 0402  AST 19 21  ALT 12 12  ALKPHOS 97 92  BILITOT 0.7 0.6  PROT 6.6 6.2*  ALBUMIN 3.1* 3.0*   Recent Labs    03/23/2020 1455 03/11/2020 1621 03/30/20 0500 03/30/20 1643 04/07/2020 0402 04/29/2020 0402 04/24/2020 1901 04/01/20 0410  WBC 9.1   < > 8.1  --  7.9  --   --  9.4  NEUTROABS 4.6  --  4.1  --   --   --   --   --   HGB 3.5*   < > 7.0*   < > 7.0*   < > 9.1* 9.5*  HCT 12.1*   < > 22.4*   < > 22.7*   < > 29.5* 30.1*  MCV 66.5*   < > 80.0  --  78.8*  --   --  82.0  PLT 383   < > 320  --  300  --   --  321   < > = values in this interval not displayed.   Recent Labs    04/06/2020 0402  LABPROT 21.5*  INR 1.9*      Assessment/Plan: -Symptomatic anemia with hemoglobin of 3.5 on admission.   Hemoglobin improved to 7 after  blood transfusion.  Occult blood positive.  No overt bleeding.  EGD negative. -Cirrhosis of the liver.  Based on CT scan findings.  Hepatitis panel negative in 2014. - hepatic encephalopathy.  Improving -Acute on chronic kidney injury.  Improving. -Abnormal CT scan concerning for enlarged lymph nodes.  Normal AFP.  Recommendations ----------------------- - Patient was educated on importance of prep for colonoscopy.  Will place order for MiraLAX 3 times a day.  Keep her on clear liquid diet today.  Drink as much prep as she can throughout the day.   Rescheduled for colonoscopy tomorrow.  - abdominal MRI with and without IV gadolinium   -GI will follow  Risks (bleeding, infection, bowel perforation that could require surgery, sedation-related changes in cardiopulmonary systems), benefits (identification and possible treatment of source of symptoms, exclusion of certain causes of symptoms), and alternatives (watchful waiting, radiographic  imaging studies, empiric medical treatment)  were explained to patient/family in detail and patient wishes to proceed.  Otis Brace MD, [place] 04/01/2020, 8:44 AM  Contact #  [phone]

## 2020-04-02 ENCOUNTER — Encounter (HOSPITAL_COMMUNITY): Payer: Self-pay | Admitting: Internal Medicine

## 2020-04-02 ENCOUNTER — Inpatient Hospital Stay (HOSPITAL_COMMUNITY): Payer: Medicare Other | Admitting: Anesthesiology

## 2020-04-02 ENCOUNTER — Encounter (HOSPITAL_COMMUNITY): Admission: EM | Disposition: E | Payer: Self-pay | Source: Skilled Nursing Facility | Attending: Internal Medicine

## 2020-04-02 DIAGNOSIS — D649 Anemia, unspecified: Secondary | ICD-10-CM | POA: Diagnosis not present

## 2020-04-02 DIAGNOSIS — I480 Paroxysmal atrial fibrillation: Secondary | ICD-10-CM

## 2020-04-02 DIAGNOSIS — R197 Diarrhea, unspecified: Secondary | ICD-10-CM

## 2020-04-02 DIAGNOSIS — I5032 Chronic diastolic (congestive) heart failure: Secondary | ICD-10-CM | POA: Diagnosis not present

## 2020-04-02 DIAGNOSIS — R109 Unspecified abdominal pain: Secondary | ICD-10-CM

## 2020-04-02 DIAGNOSIS — E11610 Type 2 diabetes mellitus with diabetic neuropathic arthropathy: Secondary | ICD-10-CM

## 2020-04-02 HISTORY — PX: POLYPECTOMY: SHX5525

## 2020-04-02 HISTORY — PX: COLONOSCOPY WITH PROPOFOL: SHX5780

## 2020-04-02 LAB — BASIC METABOLIC PANEL
Anion gap: 7 (ref 5–15)
BUN: 46 mg/dL — ABNORMAL HIGH (ref 8–23)
CO2: 21 mmol/L — ABNORMAL LOW (ref 22–32)
Calcium: 8.8 mg/dL — ABNORMAL LOW (ref 8.9–10.3)
Chloride: 112 mmol/L — ABNORMAL HIGH (ref 98–111)
Creatinine, Ser: 1.54 mg/dL — ABNORMAL HIGH (ref 0.44–1.00)
GFR, Estimated: 35 mL/min — ABNORMAL LOW (ref >60)
Glucose, Bld: 124 mg/dL — ABNORMAL HIGH (ref 70–99)
Potassium: 5.5 mmol/L — ABNORMAL HIGH (ref 3.5–5.1)
Sodium: 140 mmol/L (ref 135–145)

## 2020-04-02 LAB — TYPE AND SCREEN
ABO/RH(D): AB POS
Antibody Screen: NEGATIVE
Unit division: 0
Unit division: 0
Unit division: 0
Unit division: 0

## 2020-04-02 LAB — BPAM RBC
Blood Product Expiration Date: 202111192359
Blood Product Expiration Date: 202112012359
Blood Product Expiration Date: 202112012359
Blood Product Expiration Date: 202112022359
ISSUE DATE / TIME: 202110302128
ISSUE DATE / TIME: 202110310157
ISSUE DATE / TIME: 202111011331
Unit Type and Rh: 5100
Unit Type and Rh: 5100
Unit Type and Rh: 5100
Unit Type and Rh: 6200

## 2020-04-02 LAB — CBC
HCT: 28.9 % — ABNORMAL LOW (ref 36.0–46.0)
Hemoglobin: 9.1 g/dL — ABNORMAL LOW (ref 12.0–15.0)
MCH: 25.8 pg — ABNORMAL LOW (ref 26.0–34.0)
MCHC: 31.5 g/dL (ref 30.0–36.0)
MCV: 81.9 fL (ref 80.0–100.0)
Platelets: 306 10*3/uL (ref 150–400)
RBC: 3.53 MIL/uL — ABNORMAL LOW (ref 3.87–5.11)
RDW: 26.9 % — ABNORMAL HIGH (ref 11.5–15.5)
WBC: 10.4 10*3/uL (ref 4.0–10.5)
nRBC: 0.3 % — ABNORMAL HIGH (ref 0.0–0.2)

## 2020-04-02 LAB — GLUCOSE, CAPILLARY
Glucose-Capillary: 116 mg/dL — ABNORMAL HIGH (ref 70–99)
Glucose-Capillary: 170 mg/dL — ABNORMAL HIGH (ref 70–99)
Glucose-Capillary: 163 mg/dL — ABNORMAL HIGH (ref 70–99)
Glucose-Capillary: 146 mg/dL — ABNORMAL HIGH (ref 70–99)

## 2020-04-02 SURGERY — COLONOSCOPY WITH PROPOFOL
Anesthesia: Monitor Anesthesia Care

## 2020-04-02 MED ORDER — LACTATED RINGERS IV SOLN: INTRAVENOUS | Status: DC

## 2020-04-02 MED ORDER — FUROSEMIDE 10 MG/ML IJ SOLN
40.0000 mg | Freq: Once | INTRAMUSCULAR | Status: AC
  Administered 2020-04-02: 40 mg via INTRAVENOUS
  Filled 2020-04-02: qty 4

## 2020-04-02 MED ORDER — PROPOFOL 500 MG/50ML IV EMUL
INTRAVENOUS | Status: DC | PRN
  Administered 2020-04-02: 75 ug/kg/min via INTRAVENOUS

## 2020-04-02 MED ORDER — SODIUM ZIRCONIUM CYCLOSILICATE 10 G PO PACK
10.0000 g | PACK | Freq: Once | ORAL | Status: AC
  Administered 2020-04-02: 10 g via ORAL
  Filled 2020-04-02: qty 1

## 2020-04-02 MED ORDER — PROPOFOL 500 MG/50ML IV EMUL
INTRAVENOUS | Status: DC | PRN
  Administered 2020-04-02 (×2): 20 mg via INTRAVENOUS
  Administered 2020-04-02: 10 mg via INTRAVENOUS

## 2020-04-02 SURGICAL SUPPLY — 22 items

## 2020-04-02 NOTE — Progress Notes (Signed)
[facility] Progress Note  Sarah Colon Sarah Colon 74 y.o. [birth year]  CC: Symptomatic anemia, heme positive stool   Subjective: Patient seen and examined in the endoscopy unit.  She had a hard time with colonoscopy prep yesterday.  Did not completed the prep.  Having liquid brown stool according to RN.  No blood in the stool noted.  ROS : Afebrile.  Negative for chest pain   Objective: Vital signs in last 24 hours: Vitals:   04/26/2020 0534 04/19/2020 1050  BP: (!) 146/66 (!) 150/79  Pulse: 99 73  Resp: 16 (!) 22  Temp: 99.1 F (37.3 C) 99.1 F (37.3 C)  SpO2: 98% 94%    Physical Exam:  General:   Elderly patient.  Not in acute distress  Head:  Normocephalic, without obvious abnormality, atraumatic  Eyes:  , EOM's intact, anicteric sclera  Lungs:    No visible respiratory distress  Heart:   Tachycardia noted  Abdomen:   Soft, non-tender, nondistended, bowel sounds present, no peritoneal signs  Extremities: Extremities normal, atraumatic, no  edema       Lab Results: Recent Labs    04/01/20 0410 04/05/2020 0428  NA 137 140  K 5.5* 5.5*  CL 110 112*  CO2 20* 21*  GLUCOSE 138* 124*  BUN 55* 46*  CREATININE 1.76* 1.54*  CALCIUM 8.9 8.8*   Recent Labs    04/26/2020 0402  AST 21  ALT 12  ALKPHOS 92  BILITOT 0.6  PROT 6.2*  ALBUMIN 3.0*   Recent Labs    04/01/20 0410 04/01/20 0410 04/01/20 1549 04/24/2020 0428  WBC 9.4  --   --  10.4  HGB 9.5*   < > 9.8* 9.1*  HCT 30.1*   < > 32.4* 28.9*  MCV 82.0  --   --  81.9  PLT 321  --   --  306   < > = values in this interval not displayed.   Recent Labs    04/22/2020 0402  LABPROT 21.5*  INR 1.9*      Assessment/Plan: -Symptomatic anemia with hemoglobin of 3.5 on admission.  Hemoglobin improved to 7 after  blood transfusion.  Occult blood positive.  No overt bleeding.  EGD negative. -Cirrhosis of the liver.  Based on CT scan findings.  Hepatitis panel negative in 2014. - hepatic encephalopathy.   Improving -Acute on chronic kidney injury.  Improving. -Abnormal CT scan concerning for enlarged lymph nodes.  Normal AFP.  Recommendations ----------------------- - Proceed with colonoscopy today.  -MRI abdomen was motion degraded and was limited in evaluation but no concerning lesions were seen.  They have recommended repeat MRI once acute issues are resolved.  Risks (bleeding, infection, bowel perforation that could require surgery, sedation-related changes in cardiopulmonary systems), benefits (identification and possible treatment of source of symptoms, exclusion of certain causes of symptoms), and alternatives (watchful waiting, radiographic imaging studies, empiric medical treatment)  were explained to patient/family in detail and patient wishes to proceed.  Otis Brace MD, [place] 04/17/2020, 11:10 AM  Contact #  [phone]

## 2020-04-02 NOTE — Progress Notes (Signed)
PROGRESS NOTE    Sarah Colon  EXB:[id number] DOB: 1946-01-02 DOA: 03/01/2020 PCP: Patient, No Pcp Per   Brief Narrative:  The patient is a 74 year old African-American female with a past medical history significant for but not limited to diabetes mellitus type 2, diastolic CHF, hypertension, paroxysmal atrial fibrillation, chronic kidney disease stage IIIb with a baseline creatinine of 1.5-1.8 as well as other comorbidities who presented to the ED with shortness of breath, chest pain, fatigue over the last week.  She also reported a poor appetite.  She is found to have a hemoglobin of 3 on admission.  There are no reports of blood in her stool or dark or tarry stools.  Fecal occult test in the ED was positive.  GI was consulted and she was admitted to the hospitalist service.  She normally lives in SNF is nonambulatory and in the wheelchair.  She underwent EGD and colonoscopy without evidence of any bleeding.  She had multiple polyp removal from her colonoscopy today.  Her prep was poor.  GI recommending resuming Eliquis tomorrow and will give her another dose of diuresis today given her volume overload.  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  -Fecal occult was positive.   -Gastroenterology consulted, appreciate input.   -Received a total of 4 units of packed red blood cells, hemoglobin has improved from 3.1 on admission to 9.5 yesterday and further improved to 9.8; today hemoglobin/hematocrit is 9.1/28.9 -Underwent an EGD 11/1 without significant findings as for the source of her GI  bleed -Colonoscopy was attempted yesterday however patient could not do the prep.  She is willing to try again.  Reschedule colonoscopy for today -Colonoscopy showed inadequate preparation and 2 to 5 mm polyps in the cecum as well as three 5 to 9 mm polyps in the transverse colon removed.  GI recommended repeating colonoscopy in 6 months because the bowel preparation was suboptimal and they have placed the patient on a soft diet -Continue to monitor for signs and symptoms of further bleeding -GI recommending a capsule endoscopy for now and recommending a full colonoscopy.  They are recommending advancing diet as tolerated and resuming anticoagulation tomorrow -We will get PT OT to further evaluate and treat  Left lower quadrant abdominal pain  -No complaints of pain this morning.  CT scan of the abdomen pelvis without acute findings  Liver cirrhosis  -New diagnosis for her, daughter was unaware.   -MRI of the abdomen per GI showed motion degraded examination and limited but there were no concerning lesions noted and repeat MRI is recommended once acute issues have resolved  Acute Metabolic Encephalopathy  -Likely due to profound anemia, her ammonia was normal yesterday.   -Her encephalopathy resolved, she is back to baseline, alert, appropriate  Acute kidney injury on chronic kidney disease stage IIIb Metabolic acidosis -Baseline creatinine 1.5-1.8, 2.3 on admission likely in the setting of profound anemia, creatinine has returned to baseline yesterday and today her BUN/creatinine is 46/1.54 -Avoid further nephrotoxic medications, contrast dyes, hypotension and renally dose medications -She has small metabolic acidosis with a CO2 of 21, anion  gap of 7, chloride level 112 -We will give a dose of IV diuresis today -repeat CMP in the a.m.  Hyperkalemia -Mild at 5.5 -Will give a dose of Lokelma 10 mg x 1 -Continue to monitor and trend and repeat CMP in a.m.  Paroxysmal A. Fib -in sinus  now, hold Eliquis,  -her diltiazem was held on admission due to hypotension but since her blood pressure was creeping back up, added back diltiazem in addition to her home metoprolol -GI recommending resuming Eliquis tomorrow  Acute on Chronic Diastolic CHF -Appears fluid overloaded with lower extremity edema -BNP is elevated 330,  -Has pleural effusion on chest x-ray.   -She is on room air, fluid status looks a little bit better.  -Diuresis now stopped but will get a dose of IV Lasix 40 mg x1 today  -Patient is +3.319 Liters still; weight has been fluctuating and essentially the same wearing within 1 pound -Continue to monitor for signs and symptoms of volume overload and repeat chest x-ray in a.m.  Leukocytosis -at her baseline, seen by hematology as an outpatient regularly -CBC this morning showed a WBC of 10.4  Hypothyroidism -continue Synthroid.  TSH normal  DM2 -A1c pending,  -placed on sliding scale -CBGs ranging from 118-170   Sacral Ulcer, poA Pressure Injury 04/27/2020 Sacrum Medial Stage III -  Full thickness tissue loss. Subcutaneous fat may be visible but bone, tendon or muscle are NOT exposed. (Active)  04/02/2020 1149  Location: Sacrum  Location Orientation: Medial  Staging: Stage III -  Full thickness tissue loss. Subcutaneous fat may be visible but bone, tendon or muscle are NOT exposed.  Wound Description (Comments):   Present on Admission: Yes    DVT prophylaxis: SCDs Code Status: DO NOT RESUSCITATE Family Communication: No family present at Bedside Disposition Plan: Anticipating discharging back to SNF in the next 24 to 48 hours in the next 24-48 hours  Status is: Inpatient  Remains inpatient appropriate because:Unsafe d/c plan, IV treatments appropriate due to intensity of illness or inability to take PO and Inpatient level of care appropriate due to severity of illness   Dispo: The patient is from: SNF              Anticipated d/c is to: SNF               Anticipated d/c date is:1-2 days              Patient currently is not medically stable to d/c.  Consultants:   Gastroenterology    Procedures:  COLONOSCOPY Findings:      Skin tags were found on perianal exam.      A large amount of solid stool was found in the rectum, in the       recto-sigmoid colon, in the sigmoid colon and in the descending colon,       interfering with visualization.      Three sessile polyps were found in the cecum. The polyps were 2 to 5 mm       in size. These polyps were removed with a hot snare. Resection and       retrieval were complete.      Six sessile polyps were found in the ascending colon. The polyps were 3       to 8 mm in size. These polyps were removed with a hot snare. Resection       and retrieval were complete.      Three sessile polyps  were found in the transverse colon. The polyps were       5 to 9 mm in size. These polyps were removed with a hot snare. Resection       and retrieval were complete.      No additional abnormalities were found on retroflexion except for large       amount of stool. Impression:               - Preparation of the colon was inadequate.                           - Perianal skin tags found on perianal exam.                           - Stool in the rectum, in the recto-sigmoid colon,                            in the sigmoid colon and in the descending colon.                           - Three 2 to 5 mm polyps in the cecum, removed with                            a hot snare. Resected and retrieved.                           - Six 3 to 8 mm polyps in the ascending colon,                            removed with a hot snare. Resected and retrieved.                           - Three 5 to 9 mm polyps in the transverse colon,                            removed with a hot snare. Resected and retrieved. Moderate Sedation:      Moderate (conscious) sedation was personally administered by an       anesthesia  professional. The following parameters were monitored: oxygen       saturation, heart rate, blood pressure, and response to care. Recommendation:           - Return patient to hospital ward for ongoing care.                           - Soft diet.                           - Continue present medications.                           - Await pathology results.                           - Repeat colonoscopy in 6 months because the bowel  preparation was suboptimal.                           - Return to GI office in 2 months.  EGD Findings:      The Z-line was regular and was found 38 cm from the incisors.      The exam of the esophagus was otherwise normal.      Normal mucosa was found in the entire examined stomach. Biopsies were       taken with a cold forceps for histology.      The cardia and gastric fundus were normal on retroflexion.      The duodenal bulb, first portion of the duodenum and second portion of       the duodenum were normal. Impression:               - Z-line regular, 38 cm from the incisors.                           - Normal mucosa was found in the entire stomach.                            Biopsied.                           - Normal duodenal bulb, first portion of the                            duodenum and second portion of the duodenum.  Antimicrobials:  Anti-infectives (From admission, onward)   Start     Dose/Rate Route Frequency Ordered Stop   03/30/20 1400  rifaximin (XIFAXAN) tablet 550 mg        550 mg Oral 2 times daily 03/30/20 1136          Subjective: Seen and examined at bedside and she is resting.  Denies any chest pain, lightheadedness or dizziness.  No bloody bowel movements noted.  Felt okay but just felt tired.  Wanting to sleep.  No other concerns or complaints at this time.  Ready for her colonoscopy but has not drank much of her prep today.  Objective: Vitals:   04/01/20 1404 04/01/20 2100 04/01/20 2106 04/15/2020  0534  BP: (!) 152/78 123/66 123/66 (!) 146/66  Pulse: (!) 104 94 95 99  Resp: 14  18 16   Temp: 98.8 F (37.1 C)  98.4 F (36.9 C) 99.1 F (37.3 C)  TempSrc: Oral  Oral Oral  SpO2: 97%  98% 98%  Weight:    61 kg  Height:        Intake/Output Summary (Last 24 hours) at 04/13/2020 3557 Last data filed at 04/12/2020 0535 Gross per 24 hour  Intake 457.4 ml  Output 600 ml  Net -142.6 ml   Filed Weights   03/30/20 0924 04/01/20 0532 04/03/2020 0534  Weight: 60.9 kg 61.4 kg 61 kg   Examination: Physical Exam:  Constitutional: WN/WD elderly African-American female currently no acute distress appears calm and comfortable  Eyes: Lids and conjunctivae normal, sclerae anicteric  ENMT: External Ears, Nose appear normal. Grossly normal hearing.  Neck: Appears normal, supple, no cervical masses, normal ROM, no appreciable thyromegaly; no JVD Respiratory: Diminished to auscultation bilaterally, no wheezing, rales, rhonchi or crackles. Normal respiratory effort and  patient is not tachypenic. No accessory muscle use.  Unlabored breathing Cardiovascular: RRR, no murmurs / rubs / gallops. S1 and S2 auscultated. Trace extremity edema Abdomen: Soft, non-tender, non-distended. Bowel sounds positive.  GU: Deferred. Musculoskeletal: No clubbing / cyanosis of digits/nails. No joint deformity upper and lower extremities.  Skin: No rashes, lesions, ulcers on limited skin evaluation. No induration; Warm and dry.  Neurologic: CN 2-12 grossly intact with no focal deficits. Romberg sign and cerebellar reflexes not assessed.  Psychiatric: Normal judgment and insight. Slightly somnolent but when awoken alert and oriented x 3. Normal mood and appropriate affect.   Data Reviewed: I have personally reviewed following labs and imaging studies  CBC: Recent Labs  Lab 03/07/2020 1455 03/08/2020 1621 03/30/20 0500 03/30/20 1643 04/04/2020 0402 04/06/2020 1901 04/01/20 0410 04/01/20 1549 04/14/2020 0428  WBC 9.1  --   8.1  --  7.9  --  9.4  --  10.4  NEUTROABS 4.6  --  4.1  --   --   --   --   --   --   HGB 3.5*   < > 7.0*   < > 7.0* 9.1* 9.5* 9.8* 9.1*  HCT 12.1*   < > 22.4*   < > 22.7* 29.5* 30.1* 32.4* 28.9*  MCV 66.5*  --  80.0  --  78.8*  --  82.0  --  81.9  PLT 383  --  320  --  300  --  321  --  306   < > = values in this interval not displayed.   Basic Metabolic Panel: Recent Labs  Lab 03/07/2020 1455 03/30/20 0500 04/24/2020 0402 04/01/20 0410 04/09/2020 0428  NA 135 137 138 137 140  K 4.7 4.8 5.5* 5.5* 5.5*  CL 108 111 112* 110 112*  CO2 18* 18* 19* 20* 21*  GLUCOSE 158* 113* 151* 138* 124*  BUN 74* 72* 68* 55* 46*  CREATININE 2.30* 2.22* 2.04* 1.76* 1.54*  CALCIUM 8.4* 8.5* 8.6* 8.9 8.8*   GFR: Estimated Creatinine Clearance: 26.4 mL/min (A) (by C-G formula based on SCr of 1.54 mg/dL (H)). Liver Function Tests: Recent Labs  Lab 03/24/2020 1455 03/30/20 0500 04/27/2020 0402  AST 23 19 21   ALT 12 12 12   ALKPHOS 104 97 92  BILITOT 0.4 0.7 0.6  PROT 7.1 6.6 6.2*  ALBUMIN 3.3* 3.1* 3.0*   No results for input(s): LIPASE, AMYLASE in the last 168 hours. Recent Labs  Lab 03/30/20 1240  AMMONIA 15   Coagulation Profile: Recent Labs  Lab 04/14/2020 0402  INR 1.9*   Cardiac Enzymes: No results for input(s): CKTOTAL, CKMB, CKMBINDEX, TROPONINI in the last 168 hours. BNP (last 3 results) No results for input(s): PROBNP in the last 8760 hours. HbA1C: No results for input(s): HGBA1C in the last 72 hours. CBG: Recent Labs  Lab 04/01/20 0747 04/01/20 1240 04/01/20 1639 04/01/20 2155 04/17/2020 0714  GLUCAP 141* 122* 135* 118* 116*   Lipid Profile: No results for input(s): CHOL, HDL, LDLCALC, TRIG, CHOLHDL, LDLDIRECT in the last 72 hours. Thyroid Function Tests: No results for input(s): TSH, T4TOTAL, FREET4, T3FREE, THYROIDAB in the last 72 hours. Anemia Panel: No results for input(s): VITAMINB12, FOLATE, FERRITIN, TIBC, IRON, RETICCTPCT in the last 72 hours. Sepsis  Labs: Recent Labs  Lab 03/09/2020 1455  LATICACIDVEN 1.1    Recent Results (from the past 240 hour(s))  Respiratory Panel by RT PCR (Flu A&B, Covid) - Nasopharyngeal Swab     Status: None   Collection  Time: 03/24/2020  2:57 PM   Specimen: Nasopharyngeal Swab  Result Value Ref Range Status   SARS Coronavirus 2 by RT PCR NEGATIVE NEGATIVE Final    Comment: (NOTE) SARS-CoV-2 target nucleic acids are NOT DETECTED.  The SARS-CoV-2 RNA is generally detectable in upper respiratoy specimens during the acute phase of infection. The lowest concentration of SARS-CoV-2 viral copies this assay can detect is 131 copies/mL. A negative result does not preclude SARS-Cov-2 infection and should not be used as the sole basis for treatment or other patient management decisions. A negative result may occur with  improper specimen collection/handling, submission of specimen other than nasopharyngeal swab, presence of viral mutation(s) within the areas targeted by this assay, and inadequate number of viral copies (<131 copies/mL). A negative result must be combined with clinical observations, patient history, and epidemiological information. The expected result is Negative.  Fact Sheet for Patients:  PinkCheek.be  Fact Sheet for Healthcare Providers:  GravelBags.it  This test is no t yet approved or cleared by the [place] FDA and  has been authorized for detection and/or diagnosis of SARS-CoV-2 by FDA under an Emergency Use Authorization (EUA). This EUA will remain  in effect (meaning this test can be used) for the duration of the COVID-19 declaration under Section 564(b)(1) of the Act, 21 U.S.C. section 360bbb-3(b)(1), unless the authorization is terminated or revoked sooner.     Influenza A by PCR NEGATIVE NEGATIVE Final   Influenza B by PCR NEGATIVE NEGATIVE Final    Comment: (NOTE) The Xpert Xpress SARS-CoV-2/FLU/RSV assay is  intended as an aid in  the diagnosis of influenza from Nasopharyngeal swab specimens and  should not be used as a sole basis for treatment. Nasal washings and  aspirates are unacceptable for Xpert Xpress SARS-CoV-2/FLU/RSV  testing.  Fact Sheet for Patients: PinkCheek.be  Fact Sheet for Healthcare Providers: GravelBags.it  This test is not yet approved or cleared by the [place] FDA and  has been authorized for detection and/or diagnosis of SARS-CoV-2 by  FDA under an Emergency Use Authorization (EUA). This EUA will remain  in effect (meaning this test can be used) for the duration of the  Covid-19 declaration under Section 564(b)(1) of the Act, 21  U.S.C. section 360bbb-3(b)(1), unless the authorization is  terminated or revoked. Performed at [facility], [place] [address] [place], [place] [address]   MRSA PCR Screening     Status: Abnormal   Collection Time: 04/11/2020  7:44 AM   Specimen: Nasopharyngeal  Result Value Ref Range Status   MRSA by PCR POSITIVE (A) NEGATIVE Final    Comment:        The GeneXpert MRSA Assay (FDA approved for NASAL specimens only), is one component of a comprehensive MRSA colonization surveillance program. It is not intended to diagnose MRSA infection nor to guide or monitor treatment for MRSA infections. RESULT CALLED TO, READ BACK BY AND VERIFIED WITH: DARK,D. RN @1111  ON 11.01.2021 BY COHEN,K Performed at [facility], [place] [address] [place], [place] [address]      RN Pressure Injury Documentation: Pressure Injury 04/01/2020 Sacrum Medial Stage III -  Full thickness tissue loss. Subcutaneous fat may be visible but bone, tendon or muscle are NOT exposed. (Active)  04/01/2020 1149  Location: Sacrum  Location Orientation: Medial  Staging: Stage III -  Full thickness tissue loss. Subcutaneous fat may be visible but bone, tendon or muscle are NOT  exposed.  Wound Description (Comments):   Present on  Admission: Yes     Estimated body mass index is 25 kg/m as calculated from the following:   Height as of this encounter: 5' 1.5" (1.562 m).   Weight as of this encounter: 61 kg.  Malnutrition Type:      Malnutrition Characteristics:      Nutrition Interventions:    Radiology Studies: MR LIVER W WO CONTRAST  Result Date: 04/01/2020 CLINICAL DATA:  Cirrhotic liver morphology on previous imaging. EXAM: MRI ABDOMEN WITHOUT AND WITH CONTRAST TECHNIQUE: Multiplanar multisequence MR imaging of the abdomen was performed both before and after the administration of intravenous contrast. CONTRAST:  18mL GADAVIST GADOBUTROL 1 MMOL/ML IV SOLN COMPARISON:  CT scan 03/18/2020 FINDINGS: Lower chest: Moderate right and small left pleural effusions with basilar collapse/consolidation. Hepatobiliary: Nodular liver contour is compatible with cirrhosis. No definite focal restricted diffusion within the liver parenchyma. Postcontrast imaging is markedly degraded by motion artifact. Small or subtle foci of hyperenhancement could easily be obscured. 3.1 cm gallstone identified. No intrahepatic or extrahepatic biliary dilation. Pancreas: No focal mass lesion. No dilatation of the main duct. No intraparenchymal cyst. No peripancreatic edema. Spleen:  No splenomegaly. No focal mass lesion. Adrenals/Urinary Tract: No adrenal nodule or mass. Cortical scarring is noted in the kidneys bilaterally. Multiple tiny lesions of varying size and signal intensity are noted in both kidneys but are not well evaluated due to the tiny size and substantial motion degradation on postcontrast imaging. Stomach/Bowel: Stomach is unremarkable. No gastric wall thickening. No evidence of outlet obstruction. Duodenum is normally positioned as is the ligament of Treitz. No small bowel or colonic dilatation. Vascular/Lymphatic: No abdominal aortic aneurysm. No abdominal lymphadenopathy.  Other:  Small volume intraperitoneal free fluid. Musculoskeletal: Areas of body wall edema noted. There is diffuse enhancement in the L5 vertebral body. No evidence for paraspinal enhancing soft tissue or pedicle involvement. CT scan from 03/28/2020 shows loss of vertebral body height at L5 with superior endplate compression and relatively diffuse sclerosis. Imaging features most suggestive of compression fracture. IMPRESSION: 1. Nodular liver contour compatible with cirrhosis. No definite lesion with arterial phase hyperenhancement although marked motion degradation could obscure small or subtle lesions. Consider repeat MRI after patient recovers from acute process and can better participate with positioning and breath holding. 2. Bilateral tiny renal lesions of varying size and signal intensity, also not well evaluated due to motion artifact. Attention on follow-up recommended. 3. Cholelithiasis. 4. Bilateral pleural effusions with basilar collapse/consolidation. 5. Small volume intraperitoneal free fluid. 6. Diffuse enhancement in the L5 vertebral body with loss of vertebral body height at L5. Imaging features compatible with compression fracture. Electronically Signed   By: Misty Stanley M.D.   On: 04/01/2020 12:53   Scheduled Meds: . Chlorhexidine Gluconate Cloth  6 each Topical Q0600  . diltiazem  120 mg Oral Daily  . insulin aspart  0-9 Units Subcutaneous TID WC  . lactulose  10 g Oral BID  . levothyroxine  88 mcg Oral Q0600  . metoprolol tartrate  25 mg Oral BID  . mupirocin ointment  1 application Nasal BID  . pantoprazole  40 mg Oral Daily  . polyethylene glycol  17 g Oral Q8H  . rifaximin  550 mg Oral BID  . sodium chloride flush  3 mL Intravenous Q12H  . sodium phosphate  2 enema Rectal Once  . sodium zirconium cyclosilicate  10 g Oral Once   Continuous Infusions: . sodium chloride Stopped (04/01/20 0906)    LOS: 4 days   Georgina Quint  Lise Auer, DO [facility] PAGER is on  AMION  If 7PM-7AM, please contact night-coverage [email or web address]

## 2020-04-02 NOTE — Anesthesia Preprocedure Evaluation (Addendum)
Anesthesia Evaluation  Patient identified by MRN, date of birth, ID band Patient awake    Reviewed: Allergy & Precautions, NPO status , Patient's Chart, lab work & pertinent test results  Airway Mallampati: II  TM Distance: >3 FB Neck ROM: Full    Dental no notable dental hx.    Pulmonary neg pulmonary ROS, former smoker,    Pulmonary exam normal breath sounds clear to auscultation       Cardiovascular hypertension, +CHF  Normal cardiovascular exam+ dysrhythmias Atrial Fibrillation  Rhythm:Regular Rate:Normal     Neuro/Psych Encephalopathy secondary to severe anemia negative psych ROS   GI/Hepatic negative GI ROS, Neg liver ROS,   Endo/Other  diabetesHypothyroidism   Renal/GU Renal InsufficiencyRenal disease  negative genitourinary   Musculoskeletal negative musculoskeletal ROS (+)   Abdominal   Peds negative pediatric ROS (+)  Hematology  (+) anemia ,   Anesthesia Other Findings   Reproductive/Obstetrics negative OB ROS                            Anesthesia Physical Anesthesia Plan  ASA: III  Anesthesia Plan: MAC   Post-op Pain Management:    Induction: Intravenous  PONV Risk Score and Plan: 0  Airway Management Planned: Nasal Cannula  Additional Equipment:   Intra-op Plan:   Post-operative Plan:   Informed Consent: I have reviewed the patients History and Physical, chart, labs and discussed the procedure including the risks, benefits and alternatives for the proposed anesthesia with the patient or authorized representative who has indicated his/her understanding and acceptance.    Suspend DNR.   Dental advisory given  Plan Discussed with: CRNA and Surgeon  Anesthesia Plan Comments:        Anesthesia Quick Evaluation

## 2020-04-02 NOTE — Brief Op Note (Signed)
03/06/2020 - 04/07/2020  12:17 PM  PATIENT:  Sarah Colon  74 y.o. female  PRE-OPERATIVE DIAGNOSIS:  anemia, occult blood positve  POST-OPERATIVE DIAGNOSIS:  polypectomy  PROCEDURE:  Procedure(s): COLONOSCOPY WITH PROPOFOL (N/A) POLYPECTOMY  SURGEON:  Surgeon(s) and Role:    * Key Cen, MD - Primary  Findings ---------- -Colonoscopy showed inadequate prep with large amount of solid stool in the left colon.  More than 10 polyps removed from right colon.  No evidence of active bleeding.  Recommendations ---------------------------- -Advance diet as tolerated -Okay to resume anticoagulation tomorrow from GI standpoint -Recommend follow-up in GI clinic in 2 to 3 months after discharge -Recommend repeat colonoscopy in 6 months -No further inpatient GI work-up planned.  GI will sign off.  Call us back if needed -called daughter. No answer.   Otis Brace MD, [place] 04/01/2020, 12:19 PM  Contact #  [phone]

## 2020-04-02 NOTE — Transfer of Care (Signed)
Immediate Anesthesia Transfer of Care Note  Patient: Sarah Colon  Procedure(s) Performed: COLONOSCOPY WITH PROPOFOL (N/A ) POLYPECTOMY  Patient Location: Endoscopy Unit  Anesthesia Type:MAC  Level of Consciousness: sedated and responds to stimulation  Airway & Oxygen Therapy: Patient Spontanous Breathing and Patient connected to face mask oxygen  Post-op Assessment: Report given to RN, Post -op Vital signs reviewed and stable and Patient moving all extremities  Post vital signs: Reviewed and stable  Last Vitals:  Vitals Value Taken Time  BP    Temp    Pulse    Resp    SpO2      Last Pain:  Vitals:   04/21/2020 1050  TempSrc: Oral  PainSc: 0-No pain      Patients Stated Pain Goal: 2 (34/19/62 2297)  Complications: No complications documented.

## 2020-04-02 NOTE — Plan of Care (Signed)
  Problem: Health Behavior/Discharge Planning: Goal: Ability to manage health-related needs will improve Outcome: Progressing   Problem: Clinical Measurements: Goal: Ability to maintain clinical measurements within normal limits will improve Outcome: Progressing Goal: Will remain free from infection Outcome: Progressing Goal: Diagnostic test results will improve Outcome: Progressing Goal: Cardiovascular complication will be avoided Outcome: Progressing   Problem: Activity: Goal: Risk for activity intolerance will decrease Outcome: Progressing   Problem: Nutrition: Goal: Adequate nutrition will be maintained Outcome: Progressing   Problem: Coping: Goal: Level of anxiety will decrease Outcome: Progressing   Problem: Elimination: Goal: Will not experience complications related to bowel motility Outcome: Progressing Goal: Will not experience complications related to urinary retention Outcome: Progressing   Problem: Safety: Goal: Ability to remain free from injury will improve Outcome: Progressing   Problem: Skin Integrity: Goal: Risk for impaired skin integrity will decrease Outcome: Progressing

## 2020-04-02 NOTE — Anesthesia Postprocedure Evaluation (Signed)
Anesthesia Post Note  Patient: Sarah Colon  Procedure(s) Performed: COLONOSCOPY WITH PROPOFOL (N/A ) POLYPECTOMY     Patient location during evaluation: PACU Anesthesia Type: MAC Level of consciousness: awake and alert Pain management: pain level controlled Vital Signs Assessment: post-procedure vital signs reviewed and stable Respiratory status: spontaneous breathing, nonlabored ventilation, respiratory function stable and patient connected to nasal cannula oxygen Cardiovascular status: stable and blood pressure returned to baseline Postop Assessment: no apparent nausea or vomiting Anesthetic complications: no   No complications documented.  Last Vitals:  Vitals:   04/04/2020 1050 04/01/2020 1210  BP: (!) 150/79 (!) (P) 118/41  Pulse: 73 (P) 85  Resp: (!) 22 (P) 19  Temp: 37.3 C (P) 37.1 C  SpO2: 94% (P) 100%    Last Pain:  Vitals:   04/09/2020 1210  TempSrc: (P) Oral  PainSc:                  Aston Lieske S

## 2020-04-02 NOTE — Op Note (Signed)
[facility] Patient Name: Sarah Colon Procedure Date: 04/25/2020 MRN: [id number] Attending MD: Otis Brace , MD Date of Birth: 08-09-1945 CSN: [id number] Age: 74 Admit Type: Inpatient Procedure:                Colonoscopy Indications:              This is the patient's first colonoscopy, Heme                            positive stool, Iron deficiency anemia Providers:                Otis Brace, MD, Cleda Daub, RN, Laverda Sorenson, Technician, Christell Faith, CRNA Referring MD:              Medicines:                Sedation Administered by an Anesthesia Professional Complications:            No immediate complications. Estimated Blood Loss:     Estimated blood loss was minimal. Procedure:                Pre-Anesthesia Assessment:                           - Prior to the procedure, a History and Physical                            was performed, and patient medications and                            allergies were reviewed. The patient's tolerance of                            previous anesthesia was also reviewed. The risks                            and benefits of the procedure and the sedation                            options and risks were discussed with the patient.                            All questions were answered, and informed consent                            was obtained. Prior Anticoagulants: The patient has                            taken Xarelto (rivaroxaban), last dose was 3 days                            prior to procedure. ASA Grade Assessment: III - A  patient with severe systemic disease. After                            reviewing the risks and benefits, the patient was                            deemed in satisfactory condition to undergo the                            procedure.                           After obtaining informed consent, the colonoscope                             was passed under direct vision. Throughout the                            procedure, the patient's blood pressure, pulse, and                            oxygen saturations were monitored continuously. The                            [id number]-[id number] ([id number] Olympus pediatric colonscope                            was introduced through the anus and advanced to the                            the cecum, identified by appendiceal orifice and                            ileocecal valve. The colonoscopy was technically                            difficult and complex due to inadequate bowel prep.                            The patient tolerated the procedure well. The                            quality of the bowel preparation was inadequate. Scope In: 11:37:06 AM Scope Out: 12:07:08 PM Scope Withdrawal Time: 0 hours 21 minutes 13 seconds  Total Procedure Duration: 0 hours 30 minutes 2 seconds  Findings:      Skin tags were found on perianal exam.      A large amount of solid stool was found in the rectum, in the       recto-sigmoid colon, in the sigmoid colon and in the descending colon,       interfering with visualization.      Three sessile polyps were found in the cecum. The polyps were 2 to 5 mm       in size. These polyps were removed with a hot snare. Resection and  retrieval were complete.      Six sessile polyps were found in the ascending colon. The polyps were 3       to 8 mm in size. These polyps were removed with a hot snare. Resection       and retrieval were complete.      Three sessile polyps were found in the transverse colon. The polyps were       5 to 9 mm in size. These polyps were removed with a hot snare. Resection       and retrieval were complete.      No additional abnormalities were found on retroflexion except for large       amount of stool. Impression:               - Preparation of the colon was inadequate.                           - Perianal skin tags  found on perianal exam.                           - Stool in the rectum, in the recto-sigmoid colon,                            in the sigmoid colon and in the descending colon.                           - Three 2 to 5 mm polyps in the cecum, removed with                            a hot snare. Resected and retrieved.                           - Six 3 to 8 mm polyps in the ascending colon,                            removed with a hot snare. Resected and retrieved.                           - Three 5 to 9 mm polyps in the transverse colon,                            removed with a hot snare. Resected and retrieved. Moderate Sedation:      Moderate (conscious) sedation was personally administered by an       anesthesia professional. The following parameters were monitored: oxygen       saturation, heart rate, blood pressure, and response to care. Recommendation:           - Return patient to hospital ward for ongoing care.                           - Soft diet.                           - Continue present medications.                           -  Await pathology results.                           - Repeat colonoscopy in 6 months because the bowel                            preparation was suboptimal.                           - Return to GI office in 2 months. Procedure Code(s):        --- Professional ---                           [phone], Colonoscopy, flexible; with removal of                            tumor(s), polyp(s), or other lesion(s) by snare                            technique Diagnosis Code(s):        --- Professional ---                           K63.5, Polyp of colon                           K64.4, Residual hemorrhoidal skin tags                           R19.5, Other fecal abnormalities                           D50.9, Iron deficiency anemia, unspecified CPT copyright 2019 American Medical Association. All rights reserved. The codes documented in this report are preliminary  and upon coder review may  be revised to meet current compliance requirements. Otis Brace, MD Otis Brace, MD 04/01/2020 12:17:09 PM Number of Addenda: 0

## 2020-04-02 NOTE — Anesthesia Procedure Notes (Addendum)
Procedure Name: MAC Date/Time: 04/15/2020 11:28 AM Performed by: West Pugh, CRNA Pre-anesthesia Checklist: Patient identified, Emergency Drugs available, Suction available, Patient being monitored and Timeout performed Patient Re-evaluated:Patient Re-evaluated prior to induction Oxygen Delivery Method: Simple face mask Preoxygenation: Pre-oxygenation with 100% oxygen Placement Confirmation: positive ETCO2 Dental Injury: Teeth and Oropharynx as per pre-operative assessment

## 2020-04-02 NOTE — Progress Notes (Signed)
Pt refused to drink two more glasses of golyetely

## 2020-04-03 ENCOUNTER — Encounter (HOSPITAL_COMMUNITY): Payer: Self-pay | Admitting: Gastroenterology

## 2020-04-03 ENCOUNTER — Inpatient Hospital Stay (HOSPITAL_COMMUNITY): Payer: Medicare Other

## 2020-04-03 DIAGNOSIS — D649 Anemia, unspecified: Secondary | ICD-10-CM | POA: Diagnosis not present

## 2020-04-03 DIAGNOSIS — R109 Unspecified abdominal pain: Secondary | ICD-10-CM | POA: Diagnosis not present

## 2020-04-03 DIAGNOSIS — I5032 Chronic diastolic (congestive) heart failure: Secondary | ICD-10-CM | POA: Diagnosis not present

## 2020-04-03 DIAGNOSIS — R197 Diarrhea, unspecified: Secondary | ICD-10-CM | POA: Diagnosis not present

## 2020-04-03 LAB — GLUCOSE, CAPILLARY
Glucose-Capillary: 145 mg/dL — ABNORMAL HIGH (ref 70–99)
Glucose-Capillary: 143 mg/dL — ABNORMAL HIGH (ref 70–99)
Glucose-Capillary: 132 mg/dL — ABNORMAL HIGH (ref 70–99)
Glucose-Capillary: 127 mg/dL — ABNORMAL HIGH (ref 70–99)

## 2020-04-03 LAB — CBC WITH DIFFERENTIAL/PLATELET
Abs Immature Granulocytes: 0.07 10*3/uL (ref 0.00–0.07)
Basophils Absolute: 0.1 10*3/uL (ref 0.0–0.1)
Basophils Relative: 0 %
Eosinophils Absolute: 0.5 10*3/uL (ref 0.0–0.5)
Eosinophils Relative: 3 %
HCT: 31 % — ABNORMAL LOW (ref 36.0–46.0)
Hemoglobin: 9.7 g/dL — ABNORMAL LOW (ref 12.0–15.0)
Immature Granulocytes: 1 %
Lymphocytes Relative: 19 %
Lymphs Abs: 2.8 10*3/uL (ref 0.7–4.0)
MCH: 25.3 pg — ABNORMAL LOW (ref 26.0–34.0)
MCHC: 31.3 g/dL (ref 30.0–36.0)
MCV: 80.9 fL (ref 80.0–100.0)
Monocytes Absolute: 1.5 10*3/uL — ABNORMAL HIGH (ref 0.1–1.0)
Monocytes Relative: 10 %
Neutro Abs: 10 10*3/uL — ABNORMAL HIGH (ref 1.7–7.7)
Neutrophils Relative %: 67 %
Platelets: 326 10*3/uL (ref 150–400)
RBC: 3.83 MIL/uL — ABNORMAL LOW (ref 3.87–5.11)
RDW: 27.6 % — ABNORMAL HIGH (ref 11.5–15.5)
WBC: 14.8 10*3/uL — ABNORMAL HIGH (ref 4.0–10.5)
nRBC: 0 % (ref 0.0–0.2)

## 2020-04-03 LAB — COMPREHENSIVE METABOLIC PANEL
ALT: 13 U/L (ref 0–44)
AST: 19 U/L (ref 15–41)
Albumin: 3.1 g/dL — ABNORMAL LOW (ref 3.5–5.0)
Alkaline Phosphatase: 103 U/L (ref 38–126)
Anion gap: 9 (ref 5–15)
BUN: 45 mg/dL — ABNORMAL HIGH (ref 8–23)
CO2: 20 mmol/L — ABNORMAL LOW (ref 22–32)
Calcium: 8.7 mg/dL — ABNORMAL LOW (ref 8.9–10.3)
Chloride: 109 mmol/L (ref 98–111)
Creatinine, Ser: 1.61 mg/dL — ABNORMAL HIGH (ref 0.44–1.00)
GFR, Estimated: 33 mL/min — ABNORMAL LOW (ref >60)
Glucose, Bld: 149 mg/dL — ABNORMAL HIGH (ref 70–99)
Potassium: 4.4 mmol/L (ref 3.5–5.1)
Sodium: 138 mmol/L (ref 135–145)
Total Bilirubin: 0.8 mg/dL (ref 0.3–1.2)
Total Protein: 6.9 g/dL (ref 6.5–8.1)

## 2020-04-03 LAB — MAGNESIUM: Magnesium: 1.9 mg/dL (ref 1.7–2.4)

## 2020-04-03 LAB — HEMOGLOBIN AND HEMATOCRIT, BLOOD
HCT: 32 % — ABNORMAL LOW (ref 36.0–46.0)
Hemoglobin: 10 g/dL — ABNORMAL LOW (ref 12.0–15.0)

## 2020-04-03 LAB — SURGICAL PATHOLOGY

## 2020-04-03 LAB — PHOSPHORUS: Phosphorus: 3.5 mg/dL (ref 2.5–4.6)

## 2020-04-03 MED ORDER — GABAPENTIN 300 MG PO CAPS
600.0000 mg | ORAL_CAPSULE | Freq: Two times a day (BID) | ORAL | Status: DC
  Administered 2020-04-03 – 2020-04-06 (×7): 600 mg via ORAL
  Filled 2020-04-03 (×7): qty 2

## 2020-04-03 MED ORDER — TRAMADOL HCL 50 MG PO TABS
50.0000 mg | ORAL_TABLET | Freq: Two times a day (BID) | ORAL | Status: DC | PRN
  Administered 2020-04-03 – 2020-04-07 (×5): 50 mg via ORAL
  Filled 2020-04-03 (×5): qty 1

## 2020-04-03 MED ORDER — RIVAROXABAN 15 MG PO TABS
15.0000 mg | ORAL_TABLET | Freq: Every day | ORAL | Status: DC
  Administered 2020-04-03 – 2020-04-05 (×3): 15 mg via ORAL
  Filled 2020-04-03 (×4): qty 1

## 2020-04-03 NOTE — Plan of Care (Signed)
  Problem: Health Behavior/Discharge Planning: Goal: Ability to manage health-related needs will improve Outcome: Progressing   Problem: Clinical Measurements: Goal: Ability to maintain clinical measurements within normal limits will improve Outcome: Progressing Goal: Will remain free from infection Outcome: Progressing Goal: Diagnostic test results will improve Outcome: Progressing Goal: Cardiovascular complication will be avoided Outcome: Progressing   Problem: Activity: Goal: Risk for activity intolerance will decrease Outcome: Progressing   Problem: Nutrition: Goal: Adequate nutrition will be maintained Outcome: Progressing   Problem: Coping: Goal: Level of anxiety will decrease Outcome: Progressing   Problem: Elimination: Goal: Will not experience complications related to bowel motility Outcome: Progressing Goal: Will not experience complications related to urinary retention Outcome: Progressing   Problem: Safety: Goal: Ability to remain free from injury will improve Outcome: Progressing   Problem: Skin Integrity: Goal: Risk for impaired skin integrity will decrease Outcome: Progressing
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
## 2020-04-03 NOTE — Progress Notes (Signed)
PT Cancellation Note  Patient Details Name: Sarah Colon MRN: [id number] DOB: 1945-10-29   Cancelled Treatment:    Reason Eval/Treat Not Completed: Pain limiting ability to participate (pt reports she's not had pain medication and is still in pain, RN notified of request for pain medication. Will follow.)  Philomena Doheny PT 04/03/2020  Acute Rehabilitation Services Pager [phone] Office [phone]

## 2020-04-03 NOTE — Progress Notes (Signed)
Temp 101.1 offered Tylenol and pt refused.

## 2020-04-03 NOTE — Progress Notes (Signed)
PT Cancellation Note  Patient Details Name: Sarah Colon MRN: [id number] DOB: 10-22-1945   Cancelled Treatment:    Reason Eval/Treat Not Completed: Pain limiting ability to participate (pt reported she's too sore on her bottom to do any PT at present. Pain medication requested. She declined repositioning. Will follow.)   Philomena Doheny PT 04/03/2020  Acute Rehabilitation Services Pager [phone] Office [phone]

## 2020-04-03 NOTE — Progress Notes (Signed)
   04/03/20 1025  Assess: MEWS Score  BP (!) 172/82  Pulse Rate (!) 115  Resp (!) 22  Level of Consciousness Alert  SpO2 97 %  O2 Device Room Air  Assess: MEWS Score  MEWS Temp 0  MEWS Systolic 0  MEWS Pulse 2  MEWS RR 1  MEWS LOC 0  MEWS Score 3  MEWS Score Color Yellow  Assess: if the MEWS score is Yellow or Red  Were vital signs taken at a resting state? Yes  Focused Assessment No change from prior assessment  Early Detection of Sepsis Score *See Row Information* Low  MEWS guidelines implemented *See Row Information* Yes  Treat  MEWS Interventions Administered scheduled meds/treatments  Take Vital Signs  Increase Vital Sign Frequency  Yellow: Q 2hr X 2 then Q 4hr X 2, if remains yellow, continue Q 4hrs  Escalate  MEWS: Escalate Yellow: discuss with charge nurse/RN and consider discussing with provider and RRT  Notify: Charge Nurse/RN  Name of Charge Nurse/RN Notified Sharyn Blitz RN  Date Charge Nurse/RN Notified 04/03/20  Time Charge Nurse/RN Notified 1025
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
## 2020-04-03 NOTE — Progress Notes (Addendum)
PROGRESS NOTE    Sarah Colon  DVV:[id number] DOB: [birth year] DOA: 03/28/2020 PCP: Patient, No Pcp Per   Brief Narrative:  The patient is a 74 year old African-American female with a past medical history significant for but not limited to diabetes mellitus type 2, diastolic CHF, hypertension, paroxysmal atrial fibrillation, chronic kidney disease stage IIIb with a baseline creatinine of 1.5-1.8 as well as other comorbidities who presented to the ED with shortness of breath, chest pain, fatigue over the last week.  She also reported a poor appetite.  She is found to have a hemoglobin of 3 on admission.  There are no reports of blood in her stool or dark or tarry stools.  Fecal occult test in the ED was positive.  GI was consulted and she was admitted to the hospitalist service.  She normally lives in SNF is nonambulatory and in the wheelchair.  She underwent EGD and colonoscopy without evidence of any bleeding.  She had multiple polyp removal from her colonoscopy today.  Her prep was poor.  GI recommending resuming Xarelto 04/03/20 and she received another dose of diuresis yesterday given her volume overload.  Her Xarelto is now resumed today but this evening on 04/04/2019 when she spiked a temperature and she does have a leukocytosis which is chronic but we will panculture to rule out infection.  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  -Fecal occult was positive.   -Gastroenterology consulted, appreciate input.   -Received a total of 4 units of packed red blood cells, hemoglobin has improved from 3.1  on admission to 9.5 yesterday and further improved to 9.8; yesterday hemoglobin/hematocrit is 9.1/28.9 and this morning it was 9.7/31.0 and repeat this afternoon was 10.0/32.0 -Underwent an EGD 11/1 without significant findings as for the source of her GI bleed -Colonoscopy was attempted yesterday however patient could not do the prep.  She is willing to try again.  Reschedule colonoscopy for 04/08/2020 -Colonoscopy showed inadequate preparation and 2 to 5 mm polyps in the cecum as well as three 5 to 9 mm polyps in the transverse colon removed.  GI recommended repeating colonoscopy in 6 months because the bowel preparation was suboptimal and they have placed the patient on a soft diet -Continue to monitor for signs and symptoms of further bleeding now that we resume her Xarelto -GI recommending a capsule endoscopy for now and recommending a full colonoscopy.  They are recommending advancing diet as tolerated and resuming anticoagulation tomorrow -We will get PT OT to further evaluate and treat and this is still pending given the patient was in too much pain -continue to monitor given that we have resumed her anticoagulation and if stable likely can be discharged tomorrow  Left lower quadrant abdominal pain  -No complaints of pain this morning.  CT scan of the abdomen pelvis without acute findings  Liver cirrhosis  -New diagnosis for her, daughter was unaware.   -MRI of the abdomen per GI showed motion degraded examination and limited but there were no concerning lesions noted and repeat MRI is recommended once acute issues have resolved -MRI of the abdomen  can be done in outpatient setting  Acute Metabolic Encephalopathy  -Likely due to profound anemia, her ammonia was normal yesterday.   -Her encephalopathy resolved, she is back to baseline, alert, appropriate  Acute kidney injury on chronic kidney disease stage IIIb Metabolic acidosis -Baseline creatinine 1.5-1.8, 2.3 on admission likely in  the setting of profound anemia, creatinine has returned to baseline and yesterday her BUN/creatinine is 46/1.54 and today it is 45/1.20 -Avoid further nephrotoxic medications, contrast dyes, hypotension and renally dose medications -She has small metabolic acidosis with a CO2 of 20, anion gap of 9, chloride level 109 -We will give a dose of IV diuresis today -repeat CMP in the a.m.  Hyperkalemia -Mild at 5.5 now improved after [facility] -She is given a dose of Lokelma 10 mg yesterday and now potassium is 4.4 -Continue to monitor and trend and repeat CMP in a.m.  Fever -Patient spiked a temperature and became tachycardic.  She does have a leukocytosis but has a chronic leukocytosis has been following up with hematology in outpatient setting next-we will panculture and obtain blood cultures x2, urinalysis and urine culture.  Chest x-ray, lactic acid level as well as procalcitonin level -We will continue to follow cultures and monitor temperature curve  -Continue with acetaminophen for fever.  Paroxysmal A. Fib -in sinus now, held her Xarelto and this is now been resumed,  -her diltiazem was held on admission due to hypotension but since her blood pressure was creeping back up, added back diltiazem in addition to her home metoprolol -GI recommending resuming Xarelto tomorrow  Acute on Chronic Diastolic CHF -Appears fluid overloaded with lower extremity edema -BNP is elevated 330,  -Has pleural effusion on chest x-ray.   -She is on room air, fluid status looks a little bit better.  -Diuresis now stopped but she did get a dose of IV Lasix yesterday -Patient is +3.149 Liters still; weight has been fluctuating and essentially the same wearing within 1 pound -Continue to monitor for signs and symptoms of volume overload and repeat chest x-ray in a.m.  Leukocytosis -at her baseline, seen by hematology as an outpatient regularly -CBC this morning showed a WBC of 10.4 now worsened to 14.8 -She  will need to follow-up with hematology in the outpatient setting as she has had a relatively elevated leukocytosis since 2014  Hypothyroidism -continue Synthroid.  TSH normal  DM2 -A1c pending,  -placed on sliding scale -CBGs ranging from 132-170   Sacral Ulcer, poA Pressure Injury 04/16/2020 Sacrum Medial Stage III -  Full thickness tissue loss. Subcutaneous fat may be visible but bone, tendon or muscle are NOT exposed. (Active)  04/27/2020 1149  Location: Sacrum  Location Orientation: Medial  Staging: Stage III -  Full thickness tissue loss. Subcutaneous fat may be visible but bone, tendon or muscle are NOT exposed.  Wound Description (Comments):   Present on Admission: Yes    DVT prophylaxis: SCDs and now have resumed her home rivaroxaban Code Status: DO NOT RESUSCITATE Family Communication: No family present at Bedside Disposition Plan: Anticipating discharging back to SNF in the next 24 to 48 hours in the next 24-48 hours  Status is: Inpatient  Remains inpatient appropriate because:Unsafe d/c plan, IV treatments appropriate due to intensity of illness or inability to take PO and Inpatient level of care appropriate due to severity of illness   Dispo: The patient is from: SNF              Anticipated d/c is to: SNF  Anticipated d/c date is:1 day              Patient currently is not medically stable to d/c.  Consultants:   Gastroenterology    Procedures:  COLONOSCOPY Findings:      Skin tags were found on perianal exam.      A large amount of solid stool was found in the rectum, in the       recto-sigmoid colon, in the sigmoid colon and in the descending colon,       interfering with visualization.      Three sessile polyps were found in the cecum. The polyps were 2 to 5 mm       in size. These polyps were removed with a hot snare. Resection and       retrieval were complete.      Six sessile polyps were found in the ascending colon. The polyps were 3        to 8 mm in size. These polyps were removed with a hot snare. Resection       and retrieval were complete.      Three sessile polyps were found in the transverse colon. The polyps were       5 to 9 mm in size. These polyps were removed with a hot snare. Resection       and retrieval were complete.      No additional abnormalities were found on retroflexion except for large       amount of stool. Impression:               - Preparation of the colon was inadequate.                           - Perianal skin tags found on perianal exam.                           - Stool in the rectum, in the recto-sigmoid colon,                            in the sigmoid colon and in the descending colon.                           - Three 2 to 5 mm polyps in the cecum, removed with                            a hot snare. Resected and retrieved.                           - Six 3 to 8 mm polyps in the ascending colon,                            removed with a hot snare. Resected and retrieved.                           - Three 5 to 9 mm polyps in the transverse colon,                            removed with a hot  snare. Resected and retrieved. Moderate Sedation:      Moderate (conscious) sedation was personally administered by an       anesthesia professional. The following parameters were monitored: oxygen       saturation, heart rate, blood pressure, and response to care. Recommendation:           - Return patient to hospital ward for ongoing care.                           - Soft diet.                           - Continue present medications.                           - Await pathology results.                           - Repeat colonoscopy in 6 months because the bowel                            preparation was suboptimal.                           - Return to GI office in 2 months.  EGD Findings:      The Z-line was regular and was found 38 cm from the incisors.      The exam of the esophagus was  otherwise normal.      Normal mucosa was found in the entire examined stomach. Biopsies were       taken with a cold forceps for histology.      The cardia and gastric fundus were normal on retroflexion.      The duodenal bulb, first portion of the duodenum and second portion of       the duodenum were normal. Impression:               - Z-line regular, 38 cm from the incisors.                           - Normal mucosa was found in the entire stomach.                            Biopsied.                           - Normal duodenal bulb, first portion of the                            duodenum and second portion of the duodenum.  Antimicrobials:  Anti-infectives (From admission, onward)   Start     Dose/Rate Route Frequency Ordered Stop   03/30/20 1400  rifaximin (XIFAXAN) tablet 550 mg        550 mg Oral 2 times daily 03/30/20 1136          Subjective: Seen and examined at bedside and she was resting and just waking up.  Denies any chest pain, lightheadedness or dizziness but was complaining of a sore bottom.  No lightheadedness or  dizziness but refused to work with PT today.  No other concerns or complaints at this time.  Objective: Vitals:   04/03/20 1025 04/03/20 1200 04/03/20 1306 04/03/20 1407  BP: (!) 172/82  (!) 141/76 138/72  Pulse: (!) 115  85 98  Resp: (!) 22 20 18    Temp:   99.1 F (37.3 C) 99.3 F (37.4 C)  TempSrc:   Oral Oral  SpO2:   98% 97%  Weight:      Height:        Intake/Output Summary (Last 24 hours) at 04/03/2020 1813 Last data filed at 04/03/2020 1404 Gross per 24 hour  Intake 180 ml  Output 350 ml  Net -170 ml   Filed Weights   03/30/20 0924 04/01/20 0532 04/16/2020 0534  Weight: 60.9 kg 61.4 kg 61 kg   Examination: Physical Exam:  Constitutional: WN/WD elderly AAF currently in NAD and appears calm and sleepy but is slightly uncomfortable Eyes: Lids and conjunctivae normal, sclerae anicteric  ENMT: External Ears, Nose appear normal. Grossly  normal hearing.  Neck: Appears normal, supple, no cervical masses, normal ROM, no appreciable thyromegaly Respiratory: Diminished to auscultation bilaterally, no wheezing, rales, rhonchi or crackles. Normal respiratory effort and patient is not tachypenic. No accessory muscle use. Unlabored breathing  Cardiovascular: Slightly tachycardic rate, no murmurs / rubs / gallops. Abdomen: Soft, non-tender, non-distended.  Bowel sounds positive.  GU: Deferred. Musculoskeletal: No clubbing / cyanosis of digits/nails. No joint deformity upper and lower extremities Skin: Has a sacral ulcer. No induration; Warm and dry.  Neurologic: CN 2-12 grossly intact with no focal deficits. Romberg sign and cerebellar reflexes not assessed.  Psychiatric: Normal judgment and insight. Alert and oriented x 3. Slightly somnolent mood and appropriate affect.   Data Reviewed: I have personally reviewed following labs and imaging studies  CBC: Recent Labs  Lab 03/15/2020 1455 03/05/2020 1621 03/30/20 0500 03/30/20 1643 04/11/2020 0402 04/11/2020 1901 04/01/20 0410 04/01/20 1549 04/11/2020 0428 04/03/20 0522 04/03/20 1642  WBC 9.1   < > 8.1  --  7.9  --  9.4  --  10.4 14.8*  --   NEUTROABS 4.6  --  4.1  --   --   --   --   --   --  10.0*  --   HGB 3.5*   < > 7.0*   < > 7.0*   < > 9.5* 9.8* 9.1* 9.7* 10.0*  HCT 12.1*   < > 22.4*   < > 22.7*   < > 30.1* 32.4* 28.9* 31.0* 32.0*  MCV 66.5*   < > 80.0  --  78.8*  --  82.0  --  81.9 80.9  --   PLT 383   < > 320  --  300  --  321  --  306 326  --    < > = values in this interval not displayed.   Basic Metabolic Panel: Recent Labs  Lab 03/30/20 0500 04/05/2020 0402 04/01/20 0410 04/16/2020 0428 04/03/20 0522  NA 137 138 137 140 138  K 4.8 5.5* 5.5* 5.5* 4.4  CL 111 112* 110 112* 109  CO2 18* 19* 20* 21* 20*  GLUCOSE 113* 151* 138* 124* 149*  BUN 72* 68* 55* 46* 45*  CREATININE 2.22* 2.04* 1.76* 1.54* 1.61*  CALCIUM 8.5* 8.6* 8.9 8.8* 8.7*  MG  --   --   --   --  1.9    PHOS  --   --   --   --  3.5   GFR: Estimated Creatinine Clearance: 25.2 mL/min (A) (by C-G formula based on SCr of 1.61 mg/dL (H)). Liver Function Tests: Recent Labs  Lab 03/04/2020 1455 03/30/20 0500 04/23/2020 0402 04/03/20 0522  AST 23 19 21 19   ALT 12 12 12 13   ALKPHOS 104 97 92 103  BILITOT 0.4 0.7 0.6 0.8  PROT 7.1 6.6 6.2* 6.9  ALBUMIN 3.3* 3.1* 3.0* 3.1*   No results for input(s): LIPASE, AMYLASE in the last 168 hours. Recent Labs  Lab 03/30/20 1240  AMMONIA 15   Coagulation Profile: Recent Labs  Lab 04/21/2020 0402  INR 1.9*   Cardiac Enzymes: No results for input(s): CKTOTAL, CKMB, CKMBINDEX, TROPONINI in the last 168 hours. BNP (last 3 results) No results for input(s): PROBNP in the last 8760 hours. HbA1C: No results for input(s): HGBA1C in the last 72 hours. CBG: Recent Labs  Lab 04/14/2020 1647 04/14/2020 2020 04/03/20 0733 04/03/20 1303 04/03/20 1709  GLUCAP 163* 146* 145* 143* 132*   Lipid Profile: No results for input(s): CHOL, HDL, LDLCALC, TRIG, CHOLHDL, LDLDIRECT in the last 72 hours. Thyroid Function Tests: No results for input(s): TSH, T4TOTAL, FREET4, T3FREE, THYROIDAB in the last 72 hours. Anemia Panel: No results for input(s): VITAMINB12, FOLATE, FERRITIN, TIBC, IRON, RETICCTPCT in the last 72 hours. Sepsis Labs: Recent Labs  Lab 03/28/2020 1455  LATICACIDVEN 1.1    Recent Results (from the past 240 hour(s))  Respiratory Panel by RT PCR (Flu A&B, Covid) - Nasopharyngeal Swab     Status: None   Collection Time: 03/18/2020  2:57 PM   Specimen: Nasopharyngeal Swab  Result Value Ref Range Status   SARS Coronavirus 2 by RT PCR NEGATIVE NEGATIVE Final    Comment: (NOTE) SARS-CoV-2 target nucleic acids are NOT DETECTED.  The SARS-CoV-2 RNA is generally detectable in upper respiratoy specimens during the acute phase of infection. The lowest concentration of SARS-CoV-2 viral copies this assay can detect is 131 copies/mL. A negative result  does not preclude SARS-Cov-2 infection and should not be used as the sole basis for treatment or other patient management decisions. A negative result may occur with  improper specimen collection/handling, submission of specimen other than nasopharyngeal swab, presence of viral mutation(s) within the areas targeted by this assay, and inadequate number of viral copies (<131 copies/mL). A negative result must be combined with clinical observations, patient history, and epidemiological information. The expected result is Negative.  Fact Sheet for Patients:  PinkCheek.be  Fact Sheet for Healthcare Providers:  GravelBags.it  This test is no t yet approved or cleared by the [place] FDA and  has been authorized for detection and/or diagnosis of SARS-CoV-2 by FDA under an Emergency Use Authorization (EUA). This EUA will remain  in effect (meaning this test can be used) for the duration of the COVID-19 declaration under Section 564(b)(1) of the Act, 21 U.S.C. section 360bbb-3(b)(1), unless the authorization is terminated or revoked sooner.     Influenza A by PCR NEGATIVE NEGATIVE Final   Influenza B by PCR NEGATIVE NEGATIVE Final    Comment: (NOTE) The Xpert Xpress SARS-CoV-2/FLU/RSV assay is intended as an aid in  the diagnosis of influenza from Nasopharyngeal swab specimens and  should not be used as a sole basis for treatment. Nasal washings and  aspirates are unacceptable for Xpert Xpress SARS-CoV-2/FLU/RSV  testing.  Fact Sheet for Patients: PinkCheek.be  Fact Sheet for Healthcare Providers: GravelBags.it  This test is not yet approved or cleared by the [place] and  has been authorized for detection and/or diagnosis of SARS-CoV-2 by  FDA under an Emergency Use Authorization (EUA). This EUA will remain  in effect (meaning this test can be used) for  the duration of the  Covid-19 declaration under Section 564(b)(1) of the Act, 21  U.S.C. section 360bbb-3(b)(1), unless the authorization is  terminated or revoked. Performed at [facility], [place] [address] [place], [place] [address]   MRSA PCR Screening     Status: Abnormal   Collection Time: 04/20/2020  7:44 AM   Specimen: Nasopharyngeal  Result Value Ref Range Status   MRSA by PCR POSITIVE (A) NEGATIVE Final    Comment:        The GeneXpert MRSA Assay (FDA approved for NASAL specimens only), is one component of a comprehensive MRSA colonization surveillance program. It is not intended to diagnose MRSA infection nor to guide or monitor treatment for MRSA infections. RESULT CALLED TO, READ BACK BY AND VERIFIED WITH: DARK,D. RN @1111  ON 11.01.2021 BY COHEN,K Performed at [facility], [place] [address] [place], [place] [address]      RN Pressure Injury Documentation: Pressure Injury 04/20/2020 Sacrum Medial Stage III -  Full thickness tissue loss. Subcutaneous fat may be visible but bone, tendon or muscle are NOT exposed. (Active)  04/27/2020 1149  Location: Sacrum  Location Orientation: Medial  Staging: Stage III -  Full thickness tissue loss. Subcutaneous fat may be visible but bone, tendon or muscle are NOT exposed.  Wound Description (Comments):   Present on Admission: Yes     Estimated body mass index is 25 kg/m as calculated from the following:   Height as of this encounter: 5' 1.5" (1.562 m).   Weight as of this encounter: 61 kg.  Malnutrition Type:      Malnutrition Characteristics:      Nutrition Interventions:    Radiology Studies: No results found. Scheduled Meds: . Chlorhexidine Gluconate Cloth  6 each Topical Q0600  . diltiazem  120 mg Oral Daily  . gabapentin  600 mg Oral BID  . insulin aspart  0-9 Units Subcutaneous TID WC  . lactulose  10 g Oral BID  . levothyroxine  88 mcg Oral Q0600  . metoprolol  tartrate  25 mg Oral BID  . mupirocin ointment  1 application Nasal BID  . pantoprazole  40 mg Oral Daily  . polyethylene glycol  17 g Oral Q8H  . rifaximin  550 mg Oral BID  . rivaroxaban  15 mg Oral Q supper  . sodium chloride flush  3 mL Intravenous Q12H  . sodium phosphate  2 enema Rectal Once   Continuous Infusions: . sodium chloride Stopped (04/01/20 0906)  . lactated ringers 10 mL/hr at 04/04/2020 2149    LOS: 5 days   Kerney Elbe, DO [facility] PAGER is on [place]  If 7PM-7AM, please contact night-coverage [email or web address]

## 2020-04-04 ENCOUNTER — Inpatient Hospital Stay (HOSPITAL_COMMUNITY): Payer: Medicare Other

## 2020-04-04 DIAGNOSIS — I5032 Chronic diastolic (congestive) heart failure: Secondary | ICD-10-CM | POA: Diagnosis not present

## 2020-04-04 DIAGNOSIS — R109 Unspecified abdominal pain: Secondary | ICD-10-CM | POA: Diagnosis not present

## 2020-04-04 DIAGNOSIS — R197 Diarrhea, unspecified: Secondary | ICD-10-CM | POA: Diagnosis not present

## 2020-04-04 DIAGNOSIS — R509 Fever, unspecified: Secondary | ICD-10-CM

## 2020-04-04 DIAGNOSIS — D649 Anemia, unspecified: Secondary | ICD-10-CM | POA: Diagnosis not present

## 2020-04-04 LAB — HEMOGLOBIN AND HEMATOCRIT, BLOOD
HCT: 29.2 % — ABNORMAL LOW (ref 36.0–46.0)
Hemoglobin: 9.1 g/dL — ABNORMAL LOW (ref 12.0–15.0)

## 2020-04-04 LAB — URINALYSIS, ROUTINE W REFLEX MICROSCOPIC
Bilirubin Urine: NEGATIVE
Glucose, UA: NEGATIVE mg/dL
Ketones, ur: NEGATIVE mg/dL
Nitrite: POSITIVE — AB
Protein, ur: 100 mg/dL — AB
Specific Gravity, Urine: 1.011 (ref 1.005–1.030)
WBC, UA: >50 WBC/hpf — ABNORMAL HIGH (ref 0–5)
pH: 5 (ref 5.0–8.0)

## 2020-04-04 LAB — COMPREHENSIVE METABOLIC PANEL
ALT: 11 U/L (ref 0–44)
AST: 18 U/L (ref 15–41)
Albumin: 2.8 g/dL — ABNORMAL LOW (ref 3.5–5.0)
Alkaline Phosphatase: 80 U/L (ref 38–126)
Anion gap: 13 (ref 5–15)
BUN: 35 mg/dL — ABNORMAL HIGH (ref 8–23)
CO2: 19 mmol/L — ABNORMAL LOW (ref 22–32)
Calcium: 8.3 mg/dL — ABNORMAL LOW (ref 8.9–10.3)
Chloride: 104 mmol/L (ref 98–111)
Creatinine, Ser: 1.41 mg/dL — ABNORMAL HIGH (ref 0.44–1.00)
GFR, Estimated: 39 mL/min — ABNORMAL LOW (ref >60)
Glucose, Bld: 141 mg/dL — ABNORMAL HIGH (ref 70–99)
Potassium: 4.3 mmol/L (ref 3.5–5.1)
Sodium: 136 mmol/L (ref 135–145)
Total Bilirubin: 0.7 mg/dL (ref 0.3–1.2)
Total Protein: 6.1 g/dL — ABNORMAL LOW (ref 6.5–8.1)

## 2020-04-04 LAB — CBC WITH DIFFERENTIAL/PLATELET
Abs Immature Granulocytes: 0.08 10*3/uL — ABNORMAL HIGH (ref 0.00–0.07)
Basophils Absolute: 0 10*3/uL (ref 0.0–0.1)
Basophils Relative: 0 %
Eosinophils Absolute: 0.4 10*3/uL (ref 0.0–0.5)
Eosinophils Relative: 3 %
HCT: 28.6 % — ABNORMAL LOW (ref 36.0–46.0)
Hemoglobin: 9 g/dL — ABNORMAL LOW (ref 12.0–15.0)
Immature Granulocytes: 1 %
Lymphocytes Relative: 18 %
Lymphs Abs: 2.7 10*3/uL (ref 0.7–4.0)
MCH: 25.4 pg — ABNORMAL LOW (ref 26.0–34.0)
MCHC: 31.5 g/dL (ref 30.0–36.0)
MCV: 80.8 fL (ref 80.0–100.0)
Monocytes Absolute: 2 10*3/uL — ABNORMAL HIGH (ref 0.1–1.0)
Monocytes Relative: 13 %
Neutro Abs: 9.5 10*3/uL — ABNORMAL HIGH (ref 1.7–7.7)
Neutrophils Relative %: 65 %
Platelets: 264 10*3/uL (ref 150–400)
RBC: 3.54 MIL/uL — ABNORMAL LOW (ref 3.87–5.11)
RDW: 27.6 % — ABNORMAL HIGH (ref 11.5–15.5)
WBC: 14.7 10*3/uL — ABNORMAL HIGH (ref 4.0–10.5)
nRBC: 0 % (ref 0.0–0.2)

## 2020-04-04 LAB — GLUCOSE, CAPILLARY
Glucose-Capillary: 131 mg/dL — ABNORMAL HIGH (ref 70–99)
Glucose-Capillary: 154 mg/dL — ABNORMAL HIGH (ref 70–99)
Glucose-Capillary: 116 mg/dL — ABNORMAL HIGH (ref 70–99)

## 2020-04-04 LAB — LACTIC ACID, PLASMA: Lactic Acid, Venous: 0.8 mmol/L (ref 0.5–1.9)

## 2020-04-04 LAB — PROCALCITONIN
Procalcitonin: 0.78 ng/mL
Procalcitonin: 0.81 ng/mL

## 2020-04-04 LAB — MAGNESIUM: Magnesium: 1.6 mg/dL — ABNORMAL LOW (ref 1.7–2.4)

## 2020-04-04 LAB — PHOSPHORUS: Phosphorus: 3.4 mg/dL (ref 2.5–4.6)

## 2020-04-04 MED ORDER — IOHEXOL 9 MG/ML PO SOLN
500.0000 mL | ORAL | Status: AC
  Administered 2020-04-04 (×2): 500 mL via ORAL

## 2020-04-04 MED ORDER — IOHEXOL 300 MG/ML  SOLN
75.0000 mL | Freq: Once | INTRAMUSCULAR | Status: AC | PRN
  Administered 2020-04-04: 75 mL via INTRAVENOUS

## 2020-04-04 MED ORDER — LEVALBUTEROL HCL 0.63 MG/3ML IN NEBU
0.6300 mg | INHALATION_SOLUTION | Freq: Four times a day (QID) | RESPIRATORY_TRACT | Status: DC
  Filled 2020-04-04: qty 3

## 2020-04-04 MED ORDER — MAGNESIUM SULFATE 2 GM/50ML IV SOLN
2.0000 g | Freq: Once | INTRAVENOUS | Status: AC
  Administered 2020-04-04: 2 g via INTRAVENOUS
  Filled 2020-04-04: qty 50

## 2020-04-04 MED ORDER — GUAIFENESIN ER 600 MG PO TB12
1200.0000 mg | ORAL_TABLET | Freq: Two times a day (BID) | ORAL | Status: DC
  Administered 2020-04-09 – 2020-04-24 (×30): 1200 mg via ORAL
  Filled 2020-04-04 (×41): qty 2

## 2020-04-04 MED ORDER — IOHEXOL 9 MG/ML PO SOLN
ORAL | Status: AC
  Filled 2020-04-04: qty 1000

## 2020-04-04 MED ORDER — SODIUM CHLORIDE 0.9 % IV SOLN
3.0000 g | Freq: Two times a day (BID) | INTRAVENOUS | Status: DC
  Administered 2020-04-04 – 2020-04-05 (×3): 3 g via INTRAVENOUS
  Filled 2020-04-04 (×2): qty 3
  Filled 2020-04-04 (×2): qty 8

## 2020-04-04 MED ORDER — IPRATROPIUM BROMIDE 0.02 % IN SOLN
0.5000 mg | Freq: Four times a day (QID) | RESPIRATORY_TRACT | Status: DC
  Filled 2020-04-04: qty 2.5

## 2020-04-04 MED ORDER — FUROSEMIDE 10 MG/ML IJ SOLN
40.0000 mg | Freq: Once | INTRAMUSCULAR | Status: AC
  Administered 2020-04-04: 40 mg via INTRAVENOUS
  Filled 2020-04-04: qty 4

## 2020-04-04 NOTE — Progress Notes (Signed)
PROGRESS NOTE    Sarah Colon  JKK:[id number] DOB: Dec 09, 1945 DOA: 03/28/2020 PCP: Patient, No Pcp Per   Brief Narrative:  The patient is a 74 year old African-American female with a past medical history significant for but not limited to diabetes mellitus type 2, diastolic CHF, hypertension, paroxysmal atrial fibrillation, chronic kidney disease stage IIIb with a baseline creatinine of 1.5-1.8 as well as other comorbidities who presented to the ED with shortness of breath, chest pain, fatigue over the last week.  She also reported a poor appetite.  She is found to have a hemoglobin of 3 on admission.  There are no reports of blood in her stool or dark or tarry stools.  Fecal occult test in the ED was positive.  GI was consulted and she was admitted to the hospitalist service.  She normally lives in SNF is nonambulatory and in the wheelchair.  She underwent EGD and colonoscopy without evidence of any bleeding.  She had multiple polyp removal from her colonoscopy today.  Her prep was poor.  GI recommending resuming Xarelto 04/03/20 and she received another dose of diuresis yesterday given her volume overload.  Her Xarelto is now resumed today but this evening on 04/04/2019 when she spiked a temperature and she does have a leukocytosis which is chronic but we will panculture to rule out infection.  She is complaining of some abdominal pain so we will get a CT of the abdomen pelvis.  Chest x-ray shows possible pneumonia so she was started on Unasyn and SLP was consulted given the location being the Right Lung Base.  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  -Fecal occult was positive.   -Gastroenterology consulted, appreciate input.   -Received a total of 4 units of packed red blood cells, hemoglobin has improved from 3.1 on admission to 9.5 yesterday and further improved to 9.8; now has been relatively stable and hemoglobin/hematocrit is 9.0/20.6 -Underwent an EGD 11/1 without significant findings as for the source of her GI bleed -Colonoscopy was attempted yesterday however patient could not do the prep.  She is willing to try again.  Reschedule colonoscopy for 04/29/2020 -Colonoscopy showed inadequate preparation and 2 to 5 mm polyps in the cecum as well as three 5 to 9 mm polyps in the transverse colon removed.  GI recommended repeating colonoscopy in 6 months because the bowel preparation was suboptimal and they have placed the patient on a soft diet -Continue to monitor for signs and symptoms of further bleeding now that we resume her Xarelto -GI recommending a capsule endoscopy for now and recommending a full colonoscopy.  They are recommending advancing diet as tolerated and resuming anticoagulation tomorrow -We will get PT OT to further evaluate and treat and they are recommending SNF -continue to monitor given that we have resumed her anticoagulation and if stable likely can be discharged tomorrow  Left lower quadrant abdominal pain  -No complaints of pain this morning.  CT scan of the abdomen pelvis without acute findings -We will repeat CT of abdomen pelvis given that she continued to have significant abdominal pain and had a  new fever and leukocytosis  Liver Cirrhosis  -New diagnosis for her, daughter was unaware.   -MRI of the abdomen per GI showed motion degraded examination and limited but there were no concerning lesions noted and repeat MRI is recommended once acute issues have resolved -MRI of the abdomen can be done in outpatient setting  Acute Metabolic Encephalopathy    -Likely due to profound anemia, her ammonia was normal yesterday.   -Her encephalopathy resolved, she is back to baseline, alert, appropriate  Acute kidney injury on chronic kidney disease stage IIIb Metabolic acidosis -Baseline creatinine 1.5-1.8, 2.3 on admission likely in the setting of profound anemia, creatinine has returned to baseline and yesterday her BUN/creatinine is 46/1.54 and today it is 45/1.20 -Avoid further nephrotoxic medications, contrast dyes, hypotension and renally dose medications -She has small metabolic acidosis with a CO2 of 19, anion gap of 13, chloride level 104 -We will give a dose of IV diuresis today -repeat CMP in the a.m.  Hyperkalemia -Mild and was 5.5 and improved after Lokelma -Her potassium is now 4.3.  Received a dose of 10 grams of Lokelma on 04/17/2020 -Continue to monitor and trend and repeat CMP in a.m.  Fever -Patient spiked a temperature and became tachycardic.  She does have a leukocytosis but has a chronic leukocytosis has been following up with hematology in outpatient setting  -we will panculture and obtain blood cultures x2, urinalysis and urine culture.  Chest x-ray, lactic acid level as well as procalcitonin level  -Acid level was normal at 0.8  -Procalcitonin level is slightly elevated and trended up to 0.81 -We will continue to follow cultures and monitor temperature curve  -Continue with acetaminophen for fever. - she was complaining of significant abdominal pain so we will obtain a CT of the abdomen pelvis today with contrast and repeat since he has a new fever -Have notified GI and she could have SBP but will have GI review and make further recommendations -She has been afebrile today but continues to have leukocytosis and this is elevated again.  She had been afebrile and did not have a leukocytosis throughout the entire hospitalization till yesterday -Follow-up on repeat CT scan  -Chest x-ray done and showed "Right lower lobe  atelectasis or infiltrate. Cardiomegaly." -Blood cultures x2 pending  -Urinalysis and urine culture still pending -Continue monitor and trend temperature curve and leukocytosis  Paroxysmal A. Fib -in sinus now, held her Xarelto and this is now been resumed,  -her diltiazem was held on admission due to hypotension but since her blood pressure was creeping back up, added back diltiazem in addition to her home metoprolol -GI recommending resuming Xarelto 04/03/20 and this has been done  Acute on Chronic Diastolic CHF -Appears fluid overloaded with lower extremity edema -BNP is elevated 330,  -Has pleural effusion on chest x-ray.   -She is on room air, fluid status looks a little bit better.  -Diuresis now stopped but she did get a dose of IV Lasix the day before yesterday and will give her dose today given that she still has some lower extremity edema -Patient is +2.795 Liters still; weight has been fluctuating and essentially the same wearing within 1 pound -Continue to monitor for signs and symptoms of volume overload and repeat chest x-ray in a.m.  Leukocytosis -at her baseline, seen by hematology as an outpatient regularly -WBC worsened during her hospitalization and went from 7.9 and trended all the way up to 14.8; today is 14.7 but she did spike  a temperature this could be infectious -She was pancultured and will treat empirically for aspiration pneumonia -She will need to follow-up with hematology in the outpatient setting as she has had a relatively elevated leukocytosis since 2014  Hypothyroidism -continue Synthroid.  TSH normal  DM2 -A1c was 5.6,  -placed on sliding scale -CBGs ranging from 118-156  Sacral Ulcer, poA Pressure Injury 04/17/2020 Sacrum Medial Stage III -  Full thickness tissue loss. Subcutaneous fat may be visible but bone, tendon or muscle are NOT exposed. (Active)  04/23/2020 1149  Location: Sacrum  Location Orientation: Medial  Staging: Stage III -  Full  thickness tissue loss. Subcutaneous fat may be visible but bone, tendon or muscle are NOT exposed.  Wound Description (Comments):   Present on Admission: Yes    DVT prophylaxis: SCDs and now have resumed her home rivaroxaban Code Status: DO NOT RESUSCITATE Family Communication: No family present at Bedside Disposition Plan: Anticipating discharging back to SNF in the next 24 to 48 hours in the next 24-48 hours  Status is: Inpatient  Remains inpatient appropriate because:Unsafe d/c plan, IV treatments appropriate due to intensity of illness or inability to take PO and Inpatient level of care appropriate due to severity of illness   Dispo: The patient is from: SNF              Anticipated d/c is to: SNF              Anticipated d/c date is: 1-2 days              Patient currently is not medically stable to d/c.  Consultants:   Gastroenterology; Re-consulted    Procedures:  COLONOSCOPY Findings:      Skin tags were found on perianal exam.      A large amount of solid stool was found in the rectum, in the       recto-sigmoid colon, in the sigmoid colon and in the descending colon,       interfering with visualization.      Three sessile polyps were found in the cecum. The polyps were 2 to 5 mm       in size. These polyps were removed with a hot snare. Resection and       retrieval were complete.      Six sessile polyps were found in the ascending colon. The polyps were 3       to 8 mm in size. These polyps were removed with a hot snare. Resection       and retrieval were complete.      Three sessile polyps were found in the transverse colon. The polyps were       5 to 9 mm in size. These polyps were removed with a hot snare. Resection       and retrieval were complete.      No additional abnormalities were found on retroflexion except for large       amount of stool. Impression:               - Preparation of the colon was inadequate.                           - Perianal skin  tags found on perianal exam.                           - Stool in  the rectum, in the recto-sigmoid colon,                            in the sigmoid colon and in the descending colon.                           - Three 2 to 5 mm polyps in the cecum, removed with                            a hot snare. Resected and retrieved.                           - Six 3 to 8 mm polyps in the ascending colon,                            removed with a hot snare. Resected and retrieved.                           - Three 5 to 9 mm polyps in the transverse colon,                            removed with a hot snare. Resected and retrieved. Moderate Sedation:      Moderate (conscious) sedation was personally administered by an       anesthesia professional. The following parameters were monitored: oxygen       saturation, heart rate, blood pressure, and response to care. Recommendation:           - Return patient to hospital ward for ongoing care.                           - Soft diet.                           - Continue present medications.                           - Await pathology results.                           - Repeat colonoscopy in 6 months because the bowel                            preparation was suboptimal.                           - Return to GI office in 2 months.  EGD Findings:      The Z-line was regular and was found 38 cm from the incisors.      The exam of the esophagus was otherwise normal.      Normal mucosa was found in the entire examined stomach. Biopsies were       taken with a cold forceps for histology.      The cardia and gastric fundus were normal on retroflexion.      The duodenal bulb, first portion of the duodenum and second  portion of       the duodenum were normal. Impression:               - Z-line regular, 38 cm from the incisors.                           - Normal mucosa was found in the entire stomach.                            Biopsied.                            - Normal duodenal bulb, first portion of the                            duodenum and second portion of the duodenum.  Antimicrobials:  Anti-infectives (From admission, onward)   Start     Dose/Rate Route Frequency Ordered Stop   04/04/20 1000  Ampicillin-Sulbactam (UNASYN) 3 g in sodium chloride 0.9 % 100 mL IVPB        3 g 200 mL/hr over 30 Minutes Intravenous Every 12 hours 04/04/20 0851     03/30/20 1400  rifaximin (XIFAXAN) tablet 550 mg        550 mg Oral 2 times daily 03/30/20 1136          Subjective: Seen and examined at bedside and she was complaining of some abdominal pain.  Waking up again and resting.  States that she did not feel good yesterday but felt a little bit better today.  No nausea or vomiting.  Does not have any respiratory symptoms or burning or discomfort in her urine but her main complaint was abdominal pain.  PT recommending SNF and will be discharged back to [facility] once she is stable.  Has been afebrile today but spiked a temperature of 101.1 yesterday.  No other concerns or complaints at this time.  Objective: Vitals:   04/03/20 2225 04/04/20 0500 04/04/20 0530 04/04/20 1207  BP: 134/63  (!) 150/90 (!) 157/68  Pulse: 64  89 92  Resp:   18 16  Temp: 99.3 F (37.4 C)  98.7 F (37.1 C) 98.5 F (36.9 C)  TempSrc: Oral  Oral Oral  SpO2: 98%  98% 99%  Weight:  59.4 kg    Height:        Intake/Output Summary (Last 24 hours) at 04/04/2020 1520 Last data filed at 04/04/2020 1500 Gross per 24 hour  Intake 496.04 ml  Output 850 ml  Net -353.96 ml   Filed Weights   04/01/20 0532 04/05/2020 0534 04/04/20 0500  Weight: 61.4 kg 61 kg 59.4 kg   Examination: Physical Exam:  Constitutional: WN/WD African-American female currently in NAD and appears calm and mildly uncomfortable Eyes: Lids and conjunctivae normal, sclerae anicteric  ENMT: External Ears, Nose appear normal. Grossly normal hearing.  Neck: Appears normal, supple, no cervical masses, normal  ROM, no appreciable thyromegaly; no JVD Respiratory: Diminished to auscultation bilaterally with coarse breath sounds worse on the right compared to left, no wheezing, rales, rhonchi or crackles. Normal respiratory effort and patient is not tachypenic. No accessory muscle use.  Unlabored breathing Cardiovascular: RRR, no murmurs / rubs / gallops. S1 and S2 auscultated.  Has 1+ lower extremity edema Abdomen: Soft, tender to palpate, slightly distended. Bowel sounds positive.  GU: Deferred. Musculoskeletal: No clubbing / cyanosis of digits/nails. No joint deformity upper and lower extremities. Skin: No rashes, lesions, ulcers on limited skin evaluation but does have a sacral ulcer not viewed today. No induration; Warm and dry.  Neurologic: CN 2-12 grossly intact with no focal deficits. Romberg sign and cerebellar reflexes not assessed.  Psychiatric: Normal judgment and insight. Alert and oriented x 3. Normal mood and appropriate affect.   Data Reviewed: I have personally reviewed following labs and imaging studies  CBC: Recent Labs  Lab 03/22/2020 1455 03/27/2020 1621 03/30/20 0500 03/30/20 1643 04/03/2020 0402 04/17/2020 1901 04/01/20 0410 04/01/20 0410 04/01/20 1549 04/05/2020 0428 04/03/20 0522 04/03/20 1642 04/04/20 0431  WBC 9.1   < > 8.1  --  7.9  --  9.4  --   --  10.4 14.8*  --  14.7*  NEUTROABS 4.6  --  4.1  --   --   --   --   --   --   --  10.0*  --  9.5*  HGB 3.5*   < > 7.0*   < > 7.0*   < > 9.5*   < > 9.8* 9.1* 9.7* 10.0* 9.0*  HCT 12.1*   < > 22.4*   < > 22.7*   < > 30.1*   < > 32.4* 28.9* 31.0* 32.0* 28.6*  MCV 66.5*   < > 80.0  --  78.8*  --  82.0  --   --  81.9 80.9  --  80.8  PLT 383   < > 320  --  300  --  321  --   --  306 326  --  264   < > = values in this interval not displayed.   Basic Metabolic Panel: Recent Labs  Lab 04/27/2020 0402 04/01/20 0410 04/02/20 0428 04/03/20 0522 04/04/20 0431  NA 138 137 140 138 136  K 5.5* 5.5* 5.5* 4.4 4.3  CL 112* 110 112* 109  104  CO2 19* 20* 21* 20* 19*  GLUCOSE 151* 138* 124* 149* 141*  BUN 68* 55* 46* 45* 35*  CREATININE 2.04* 1.76* 1.54* 1.61* 1.41*  CALCIUM 8.6* 8.9 8.8* 8.7* 8.3*  MG  --   --   --  1.9 1.6*  PHOS  --   --   --  3.5 3.4   GFR: Estimated Creatinine Clearance: 28.5 mL/min (A) (by C-G formula based on SCr of 1.41 mg/dL (H)). Liver Function Tests: Recent Labs  Lab 03/04/2020 1455 03/30/20 0500 04/26/2020 0402 04/03/20 0522 04/04/20 0431  AST 23 19 21 19 18   ALT 12 12 12 13 11   ALKPHOS 104 97 92 103 80  BILITOT 0.4 0.7 0.6 0.8 0.7  PROT 7.1 6.6 6.2* 6.9 6.1*  ALBUMIN 3.3* 3.1* 3.0* 3.1* 2.8*   No results for input(s): LIPASE, AMYLASE in the last 168 hours. Recent Labs  Lab 03/30/20 1240  AMMONIA 15   Coagulation Profile: Recent Labs  Lab 04/01/2020 0402  INR 1.9*   Cardiac Enzymes: No results for input(s): CKTOTAL, CKMB, CKMBINDEX, TROPONINI in the last 168 hours. BNP (last 3 results) No results for input(s): PROBNP in the last 8760 hours. HbA1C: No results for input(s): HGBA1C in the last 72 hours. CBG: Recent Labs  Lab 04/03/20 1303 04/03/20 1709 04/03/20 2205 04/04/20 0802 04/04/20 1159  GLUCAP 143* 132* 127* 131* 154*   Lipid Profile: No results for input(s): CHOL, HDL, LDLCALC, TRIG, CHOLHDL, LDLDIRECT in the last 72 hours. Thyroid Function Tests:  No results for input(s): TSH, T4TOTAL, FREET4, T3FREE, THYROIDAB in the last 72 hours. Anemia Panel: No results for input(s): VITAMINB12, FOLATE, FERRITIN, TIBC, IRON, RETICCTPCT in the last 72 hours. Sepsis Labs: Recent Labs  Lab 03/07/2020 1455 04/04/20 0431 04/04/20 0625  PROCALCITON  --  0.78 0.81  LATICACIDVEN 1.1  --  0.8    Recent Results (from the past 240 hour(s))  Respiratory Panel by RT PCR (Flu A&B, Covid) - Nasopharyngeal Swab     Status: None   Collection Time: 03/28/2020  2:57 PM   Specimen: Nasopharyngeal Swab  Result Value Ref Range Status   SARS Coronavirus 2 by RT PCR NEGATIVE NEGATIVE  Final    Comment: (NOTE) SARS-CoV-2 target nucleic acids are NOT DETECTED.  The SARS-CoV-2 RNA is generally detectable in upper respiratoy specimens during the acute phase of infection. The lowest concentration of SARS-CoV-2 viral copies this assay can detect is 131 copies/mL. A negative result does not preclude SARS-Cov-2 infection and should not be used as the sole basis for treatment or other patient management decisions. A negative result may occur with  improper specimen collection/handling, submission of specimen other than nasopharyngeal swab, presence of viral mutation(s) within the areas targeted by this assay, and inadequate number of viral copies (<131 copies/mL). A negative result must be combined with clinical observations, patient history, and epidemiological information. The expected result is Negative.  Fact Sheet for Patients:  PinkCheek.be  Fact Sheet for Healthcare Providers:  GravelBags.it  This test is no t yet approved or cleared by the [place] FDA and  has been authorized for detection and/or diagnosis of SARS-CoV-2 by FDA under an Emergency Use Authorization (EUA). This EUA will remain  in effect (meaning this test can be used) for the duration of the COVID-19 declaration under Section 564(b)(1) of the Act, 21 U.S.C. section 360bbb-3(b)(1), unless the authorization is terminated or revoked sooner.     Influenza A by PCR NEGATIVE NEGATIVE Final   Influenza B by PCR NEGATIVE NEGATIVE Final    Comment: (NOTE) The Xpert Xpress SARS-CoV-2/FLU/RSV assay is intended as an aid in  the diagnosis of influenza from Nasopharyngeal swab specimens and  should not be used as a sole basis for treatment. Nasal washings and  aspirates are unacceptable for Xpert Xpress SARS-CoV-2/FLU/RSV  testing.  Fact Sheet for Patients: PinkCheek.be  Fact Sheet for Healthcare  Providers: GravelBags.it  This test is not yet approved or cleared by the [place] FDA and  has been authorized for detection and/or diagnosis of SARS-CoV-2 by  FDA under an Emergency Use Authorization (EUA). This EUA will remain  in effect (meaning this test can be used) for the duration of the  Covid-19 declaration under Section 564(b)(1) of the Act, 21  U.S.C. section 360bbb-3(b)(1), unless the authorization is  terminated or revoked. Performed at [facility], [place] [address], [place], [place] [address]   MRSA PCR Screening     Status: Abnormal   Collection Time: 04/29/2020  7:44 AM   Specimen: Nasopharyngeal  Result Value Ref Range Status   MRSA by PCR POSITIVE (A) NEGATIVE Final    Comment:        The GeneXpert MRSA Assay (FDA approved for NASAL specimens only), is one component of a comprehensive MRSA colonization surveillance program. It is not intended to diagnose MRSA infection nor to guide or monitor treatment for MRSA infections. RESULT CALLED TO, READ BACK BY AND VERIFIED WITH: DARK,D. RN @1111  ON 11.01.2021 BY COHEN,K Performed at  [facility], [place] [address] [place], [place] [address]      RN Pressure Injury Documentation: Pressure Injury 04/25/2020 Sacrum Medial Stage III -  Full thickness tissue loss. Subcutaneous fat may be visible but bone, tendon or muscle are NOT exposed. (Active)  04/29/2020 1149  Location: Sacrum  Location Orientation: Medial  Staging: Stage III -  Full thickness tissue loss. Subcutaneous fat may be visible but bone, tendon or muscle are NOT exposed.  Wound Description (Comments):   Present on Admission: Yes     Estimated body mass index is 24.34 kg/m as calculated from the following:   Height as of this encounter: 5' 1.5" (1.562 m).   Weight as of this encounter: 59.4 kg.  Malnutrition Type:      Malnutrition Characteristics:      Nutrition  Interventions:    Radiology Studies: DG CHEST PORT 1 VIEW  Result Date: 04/03/2020 CLINICAL DATA:  Fever EXAM: PORTABLE CHEST 1 VIEW COMPARISON:  03/30/2020 FINDINGS: Cardiomegaly. Right basilar atelectasis or infiltrate. Left lung clear. No visible effusions. IMPRESSION: Right lower lobe atelectasis or infiltrate. Cardiomegaly. Electronically Signed   By: Rolm Baptise M.D.   On: 04/03/2020 20:08   Scheduled Meds: . Chlorhexidine Gluconate Cloth  6 each Topical Q0600  . diltiazem  120 mg Oral Daily  . gabapentin  600 mg Oral BID  . guaiFENesin  1,200 mg Oral BID  . insulin aspart  0-9 Units Subcutaneous TID WC  . iohexol      . ipratropium  0.5 mg Nebulization Q6H  . lactulose  10 g Oral BID  . levalbuterol  0.63 mg Nebulization Q6H  . levothyroxine  88 mcg Oral Q0600  . metoprolol tartrate  25 mg Oral BID  . mupirocin ointment  1 application Nasal BID  . pantoprazole  40 mg Oral Daily  . polyethylene glycol  17 g Oral Q8H  . rifaximin  550 mg Oral BID  . rivaroxaban  15 mg Oral Q supper  . sodium chloride flush  3 mL Intravenous Q12H  . sodium phosphate  2 enema Rectal Once   Continuous Infusions: . sodium chloride Stopped (04/01/20 0906)  . ampicillin-sulbactam (UNASYN) IV 3 g (04/04/20 1224)  . lactated ringers 10 mL/hr at 04/10/2020 2149  . magnesium sulfate bolus IVPB 2 g (04/04/20 1519)    LOS: 6 days   Kerney Elbe, DO [facility] PAGER is on [place]  If 7PM-7AM, please contact night-coverage [email or web address]

## 2020-04-04 NOTE — Progress Notes (Signed)
Rt went to start the Pt's tx. And the Pt said ya'll going to leave me alone. I don't want that tx. Pt was eating and RT asked if she would do the flutter and IS and she said I don't know. Flutter and IS are in the room to see if she might let 3rd shift do them.
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
## 2020-04-04 NOTE — Progress Notes (Signed)
Pharmacy Antibiotic Note  Sarah Colon is a 74 y.o. female admitted on 03/09/2020 with symptomatic anemia.  Pharmacy has been consulted for Unasyn dosing for possible aspiration PNA, possible UTI.   04/04/20 12:00 PM   PCT 0.81  Tm 101.1  WBC 14.7  SCr 1.41, CrCl ~ 28 ml/min  Plan: Unasyn 3 g iv q 12 hours  Will sign off and follow remotely for renal function, U/A, and culture results  Height: 5' 1.5" (156.2 cm) Weight: 59.4 kg (130 lb 15.3 oz) (bedscale) IBW/kg (Calculated) : 48.95  Temp (24hrs), Avg:99.6 F (37.6 C), Min:98.7 F (37.1 C), Max:101.1 F (38.4 C)  Recent Labs  Lab 03/19/2020 1455 03/30/20 0500 04/10/2020 0402 04/01/20 0410 04/01/2020 0428 04/03/20 0522 04/04/20 0431 04/04/20 0625  WBC 9.1   < > 7.9 9.4 10.4 14.8* 14.7*  --   CREATININE 2.30*   < > 2.04* 1.76* 1.54* 1.61* 1.41*  --   LATICACIDVEN 1.1  --   --   --   --   --   --  0.8   < > = values in this interval not displayed.    Estimated Creatinine Clearance: 28.5 mL/min (A) (by C-G formula based on SCr of 1.41 mg/dL (H)).    Allergies  Allergen Reactions  . Codeine Other (See Comments)    Swelling    Thank you for allowing pharmacy to be a part of this patient's care.  Ulice Dash D 04/04/2020 11:58 AM
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
## 2020-04-05 DIAGNOSIS — B952 Enterococcus as the cause of diseases classified elsewhere: Secondary | ICD-10-CM

## 2020-04-05 DIAGNOSIS — R109 Unspecified abdominal pain: Secondary | ICD-10-CM | POA: Diagnosis not present

## 2020-04-05 DIAGNOSIS — R197 Diarrhea, unspecified: Secondary | ICD-10-CM | POA: Diagnosis not present

## 2020-04-05 DIAGNOSIS — K5641 Fecal impaction: Secondary | ICD-10-CM

## 2020-04-05 DIAGNOSIS — D649 Anemia, unspecified: Secondary | ICD-10-CM | POA: Diagnosis not present

## 2020-04-05 DIAGNOSIS — I5032 Chronic diastolic (congestive) heart failure: Secondary | ICD-10-CM | POA: Diagnosis not present

## 2020-04-05 LAB — CBC WITH DIFFERENTIAL/PLATELET
Abs Immature Granulocytes: 0.09 10*3/uL — ABNORMAL HIGH (ref 0.00–0.07)
Basophils Absolute: 0 10*3/uL (ref 0.0–0.1)
Basophils Relative: 0 %
Eosinophils Absolute: 0.6 10*3/uL — ABNORMAL HIGH (ref 0.0–0.5)
Eosinophils Relative: 4 %
HCT: 27.8 % — ABNORMAL LOW (ref 36.0–46.0)
Hemoglobin: 8.6 g/dL — ABNORMAL LOW (ref 12.0–15.0)
Immature Granulocytes: 1 %
Lymphocytes Relative: 13 %
Lymphs Abs: 1.8 10*3/uL (ref 0.7–4.0)
MCH: 25.6 pg — ABNORMAL LOW (ref 26.0–34.0)
MCHC: 30.9 g/dL (ref 30.0–36.0)
MCV: 82.7 fL (ref 80.0–100.0)
Monocytes Absolute: 1.4 10*3/uL — ABNORMAL HIGH (ref 0.1–1.0)
Monocytes Relative: 10 %
Neutro Abs: 10.2 10*3/uL — ABNORMAL HIGH (ref 1.7–7.7)
Neutrophils Relative %: 72 %
Platelets: 244 10*3/uL (ref 150–400)
RBC: 3.36 MIL/uL — ABNORMAL LOW (ref 3.87–5.11)
RDW: 27.9 % — ABNORMAL HIGH (ref 11.5–15.5)
WBC: 14.1 10*3/uL — ABNORMAL HIGH (ref 4.0–10.5)
nRBC: 0 % (ref 0.0–0.2)

## 2020-04-05 LAB — PHOSPHORUS: Phosphorus: 4.1 mg/dL (ref 2.5–4.6)

## 2020-04-05 LAB — COMPREHENSIVE METABOLIC PANEL
ALT: 11 U/L (ref 0–44)
AST: 17 U/L (ref 15–41)
Albumin: 2.7 g/dL — ABNORMAL LOW (ref 3.5–5.0)
Alkaline Phosphatase: 78 U/L (ref 38–126)
Anion gap: 11 (ref 5–15)
BUN: 34 mg/dL — ABNORMAL HIGH (ref 8–23)
CO2: 19 mmol/L — ABNORMAL LOW (ref 22–32)
Calcium: 8 mg/dL — ABNORMAL LOW (ref 8.9–10.3)
Chloride: 102 mmol/L (ref 98–111)
Creatinine, Ser: 1.87 mg/dL — ABNORMAL HIGH (ref 0.44–1.00)
GFR, Estimated: 28 mL/min — ABNORMAL LOW (ref >60)
Glucose, Bld: 164 mg/dL — ABNORMAL HIGH (ref 70–99)
Potassium: 4 mmol/L (ref 3.5–5.1)
Sodium: 132 mmol/L — ABNORMAL LOW (ref 135–145)
Total Bilirubin: 0.6 mg/dL (ref 0.3–1.2)
Total Protein: 6.1 g/dL — ABNORMAL LOW (ref 6.5–8.1)

## 2020-04-05 LAB — GLUCOSE, CAPILLARY
Glucose-Capillary: 143 mg/dL — ABNORMAL HIGH (ref 70–99)
Glucose-Capillary: 140 mg/dL — ABNORMAL HIGH (ref 70–99)
Glucose-Capillary: 168 mg/dL — ABNORMAL HIGH (ref 70–99)
Glucose-Capillary: 127 mg/dL — ABNORMAL HIGH (ref 70–99)

## 2020-04-05 LAB — MAGNESIUM: Magnesium: 2.3 mg/dL (ref 1.7–2.4)

## 2020-04-05 LAB — PROCALCITONIN: Procalcitonin: 0.97 ng/mL

## 2020-04-05 MED ORDER — LEVALBUTEROL HCL 0.63 MG/3ML IN NEBU
0.6300 mg | INHALATION_SOLUTION | Freq: Four times a day (QID) | RESPIRATORY_TRACT | Status: DC | PRN
  Administered 2020-05-04: 0.63 mg via RESPIRATORY_TRACT
  Filled 2020-04-05: qty 3

## 2020-04-05 MED ORDER — SODIUM CHLORIDE 0.9 % IV SOLN
1.0000 g | Freq: Two times a day (BID) | INTRAVENOUS | Status: DC
  Administered 2020-04-05 – 2020-04-06 (×2): 1 g via INTRAVENOUS
  Filled 2020-04-05 (×3): qty 1

## 2020-04-05 NOTE — Progress Notes (Signed)
PROGRESS NOTE    Sarah Colon  QJJ:[id number] DOB: [birth year] DOA: 03/22/2020 PCP: Patient, No Pcp Per   Brief Narrative:  The patient is a 74 year old African-American female with a past medical history significant for but not limited to diabetes mellitus type 2, diastolic CHF, hypertension, paroxysmal atrial fibrillation, chronic kidney disease stage IIIb with a baseline creatinine of 1.5-1.8 as well as other comorbidities who presented to the ED with shortness of breath, chest pain, fatigue over the last week.  She also reported a poor appetite.  She is found to have a hemoglobin of 3 on admission.  There are no reports of blood in her stool or dark or tarry stools.  Fecal occult test in the ED was positive.  GI was consulted and she was admitted to the hospitalist service.  She normally lives in SNF is nonambulatory and in the wheelchair.  She underwent EGD and colonoscopy without evidence of any bleeding.  She had multiple polyp removal from her colonoscopy today.  Her prep was poor.  GI recommending resuming Xarelto 04/03/20 and she received another dose of diuresis yesterday given her volume overload.  Her Xarelto is now resumed today but this evening on 04/04/2019 when she spiked a temperature and she does have a leukocytosis which is chronic but we will panculture to rule out infection.  She is complaining of some abdominal pain so we will get a CT of the abdomen pelvis.  Chest x-ray shows possible pneumonia so she was started on Unasyn and SLP was consulted given the location being the Right Lung Base.  **After further workup it appears the patient has a UTI with Providencia and Entercoccus Faecalis. We will escalate her antibiotics to IV meropenem and hold off of Vancomycin for now. Repeat CT scan showed persistent mild perirectal and presacral fat stranding of unclear etiology and presacral rectal findings associate with cortical erosions, sclerosis and thinning of the S1 vertebral body. As  well as the urinary bladder that was thickened circumferentially. Hemoglobin has dropped a little bit GI feels that the CT findings are in the setting of fecal impaction and due to stool inflammation from the fecal impaction. They feel that her anemia worsens and she will need a flex sig with enemas for prep prior to discharge to reevaluate the rectum after resolution of the fecal impaction. We will escalate antibiotics given her UTI.  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 -Fecal occult was positive.   -Gastroenterology consulted, appreciate input.   -Received a total of 4 units of packed red blood cells, hemoglobin has improved from 3.1 on admission to 9.5 yesterday and further improved to 9.8; now has been dropping slightly as hemoglobin/hematocrit is now 8.6/27.8 -Underwent an EGD 11/1 without significant findings as for the source of her GI bleed -Colonoscopy was attempted yesterday however patient could not do the prep.  She is willing to try again.  Reschedule colonoscopy for 04/28/2020 -Colonoscopy showed inadequate preparation and 2 to 5 mm polyps in the cecum as well as  three 5 to 9 mm polyps in the transverse colon removed.  GI recommended repeating colonoscopy in 6 months because the bowel preparation was suboptimal and they have placed the patient on a soft diet -Continue to monitor for signs and symptoms of further bleeding now that we resume her Xarelto -GI recommending a capsule endoscopy for now and recommending a full  colonoscopy.  They are recommending advancing diet as tolerated and resuming anticoagulation tomorrow -We will get PT OT to further evaluate and treat and they are recommending SNF -continue to monitor given that we have resumed her anticoagulation -Repeat CT scan done as below -GI reconsulted and will give her a soapsuds enema and feels that if her hemoglobin continues to drop she will need a flex sig anoscopy  Left lower quadrant abdominal pain  -No complaints of pain this morning.  CT scan of the abdomen pelvis without acute findings -We will repeat CT of abdomen pelvis given that she continued to have significant abdominal pain and had a new fever and leukocytosis -Patient likely has a UTI and CT findings showed focal rectal wall thickening of acute cellular fluid as well as circumferentially thickened bladder  Liver Cirrhosis  -New diagnosis for her, daughter was unaware.   -MRI of the abdomen per GI showed motion degraded examination and limited but there were no concerning lesions noted and repeat MRI is recommended once acute issues have resolved -MRI of the abdomen can be done in outpatient setting  Acute Metabolic Encephalopathy  -Likely due to profound anemia, her ammonia was normal yesterday.   -Her encephalopathy resolved, she is back to baseline, alert, appropriate  Acute kidney injury on chronic kidney disease stage IIIb Metabolic acidosis -Baseline creatinine 1.5-1.8, 2.3 on admission likely in the setting of profound anemia, creatinine has returned to baseline and yesterday her BUN/creatinine is 46/1.54 and today yesterday it is 45/1.20 and today it is 34/1.7 in the setting of diuresis -Avoid further nephrotoxic medications, contrast dyes, hypotension and renally dose medications -She has small metabolic acidosis with a CO2 of 19, anion gap of 13, chloride level 104 -She is given a dose of IV diuresis yesterday -repeat CMP in the a.m.  Hyperkalemia -Mild and was 5.5 and  improved after Lokelma -Her potassium is now 4.0.  Received a dose of 10 grams of Lokelma on 04/29/2020 -Continue to monitor and trend and repeat CMP in a.m.  Fever in the setting of likely UTI with Providencia and Enterococcus -Patient spiked a temperature and became tachycardic.  She does have a leukocytosis but has a chronic leukocytosis has been following up with hematology in outpatient setting  -we will panculture and obtain blood cultures x2, urinalysis and urine culture.  Chest x-ray, lactic acid level as well as procalcitonin level  -Acid level was normal at 0.8  -Urinalysis done and showed a cloudy appearance with small hemoglobin, large leukocytes, positive nitrites, many bacteria, 11-20 RBCs per high-power field, and 50 WBCs as well as a urine culture which showed Providencia and Enterococcus -will change IV Unasyn to IV Meropenem and +/- Vanc depending on response to Meropenem -Procalcitonin level is slightly elevated and trended up to 0.81 and is further worsening to 0.97 -We will continue to follow cultures and monitor temperature curve  -Continue with acetaminophen for fever. - she was complaining of significant abdominal pain so we will obtain a CT of the abdomen pelvis today with contrast and repeat since he has a new fever -CT scan findings as below -Have notified  GI and she could have SBP but will have GI review and make further recommendations -She has been afebrile today but continues to have leukocytosis and this is elevated again.  She had been afebrile and did not have a leukocytosis throughout the entire hospitalization till yesterday -Follow-up on repeat CT scan  -Chest x-ray done and showed "Right lower lobe atelectasis or infiltrate. Cardiomegaly." -Blood cultures x2 showed NGTD <24 Hours  -Continue monitor and trend temperature curve and leukocytosis  Paroxysmal A. Fib -in sinus now, held her Xarelto and this is now been resumed,  -her diltiazem was held on  admission due to hypotension but since her blood pressure was creeping back up, added back diltiazem in addition to her home metoprolol -GI recommending resuming Xarelto 04/03/20 and this has been done  Acute on Chronic Diastolic CHF -Appears fluid overloaded with lower extremity edema -BNP is elevated 330,  -Has pleural effusion on chest x-ray.   -She is on room air, fluid status looks a little bit better.  -Diuresis now stopped but she did get a dose of IV Lasix the day before yesterday and will give her dose today given that she still has some lower extremity edema -Patient is +2.231 Liters still; weight has been fluctuating and down 3 lbs -Continue to monitor for signs and symptoms of volume overload and repeat chest x-ray intermittently   Leukocytosis -at her baseline, seen by hematology as an outpatient regularly -WBC worsened during her hospitalization and went from 7.9 and trended all the way up to 14.8; yesterday is 14.7 but she did spike a temperature and this is likely infectious as WBC is only improved to 14.1 -She was pancultured and will treat empirically for aspiration pneumonia but likley has UTI given Lower Abdominal Pain and  -She will need to follow-up with hematology in the outpatient setting as she has had a relatively elevated leukocytosis since 2014  Hypothyroidism -continue Synthroid.  TSH normal  DM2 -A1c was 5.6,  -placed on sliding scale -CBGs ranging from 118-156  Sacral Ulcer, poA Pressure Injury 04/16/2020 Sacrum Medial Stage III -  Full thickness tissue loss. Subcutaneous fat may be visible but bone, tendon or muscle are NOT exposed. (Active)  04/26/2020 1149  Location: Sacrum  Location Orientation: Medial  Staging: Stage III -  Full thickness tissue loss. Subcutaneous fat may be visible but bone, tendon or muscle are NOT exposed.  Wound Description (Comments):   Present on Admission: Yes    DVT prophylaxis: SCDs and now have resumed her home  rivaroxaban Code Status: DO NOT RESUSCITATE Family Communication: No family present at Bedside and attempted to call daughter [name] and she did not answer Disposition Plan: Needs To be stable from an infectious standpoint prior to discharging back to SNF and cleared by GI in case she needs on Norflex sigmoidoscopy  Status is: Inpatient  Remains inpatient appropriate because:Unsafe d/c plan, IV treatments appropriate due to intensity of illness or inability to take PO and Inpatient level of care appropriate due to severity of illness   Dispo: The patient is from: SNF              Anticipated d/c is to: SNF              Anticipated d/c date is: 1-2 days              Patient currently is not medically stable to d/c.  Consultants:   Gastroenterology; Re-consulted    Procedures:  COLONOSCOPY Findings:  Skin tags were found on perianal exam.      A large amount of solid stool was found in the rectum, in the       recto-sigmoid colon, in the sigmoid colon and in the descending colon,       interfering with visualization.      Three sessile polyps were found in the cecum. The polyps were 2 to 5 mm       in size. These polyps were removed with a hot snare. Resection and       retrieval were complete.      Six sessile polyps were found in the ascending colon. The polyps were 3       to 8 mm in size. These polyps were removed with a hot snare. Resection       and retrieval were complete.      Three sessile polyps were found in the transverse colon. The polyps were       5 to 9 mm in size. These polyps were removed with a hot snare. Resection       and retrieval were complete.      No additional abnormalities were found on retroflexion except for large       amount of stool. Impression:               - Preparation of the colon was inadequate.                           - Perianal skin tags found on perianal exam.                           - Stool in the rectum, in the recto-sigmoid  colon,                            in the sigmoid colon and in the descending colon.                           - Three 2 to 5 mm polyps in the cecum, removed with                            a hot snare. Resected and retrieved.                           - Six 3 to 8 mm polyps in the ascending colon,                            removed with a hot snare. Resected and retrieved.                           - Three 5 to 9 mm polyps in the transverse colon,                            removed with a hot snare. Resected and retrieved. Moderate Sedation:      Moderate (conscious) sedation was personally administered by an       anesthesia professional. The following parameters were monitored: oxygen       saturation, heart rate, blood  pressure, and response to care. Recommendation:           - Return patient to hospital ward for ongoing care.                           - Soft diet.                           - Continue present medications.                           - Await pathology results.                           - Repeat colonoscopy in 6 months because the bowel                            preparation was suboptimal.                           - Return to GI office in 2 months.  EGD Findings:      The Z-line was regular and was found 38 cm from the incisors.      The exam of the esophagus was otherwise normal.      Normal mucosa was found in the entire examined stomach. Biopsies were       taken with a cold forceps for histology.      The cardia and gastric fundus were normal on retroflexion.      The duodenal bulb, first portion of the duodenum and second portion of       the duodenum were normal. Impression:               - Z-line regular, 38 cm from the incisors.                           - Normal mucosa was found in the entire stomach.                            Biopsied.                           - Normal duodenal bulb, first portion of the                            duodenum and second  portion of the duodenum.  Antimicrobials:  Anti-infectives (From admission, onward)   Start     Dose/Rate Route Frequency Ordered Stop   04/04/20 1000  Ampicillin-Sulbactam (UNASYN) 3 g in sodium chloride 0.9 % 100 mL IVPB  Status:  Discontinued        3 g 200 mL/hr over 30 Minutes Intravenous Every 12 hours 04/04/20 0851 04/05/20 1829   03/30/20 1400  rifaximin (XIFAXAN) tablet 550 mg        550 mg Oral 2 times daily 03/30/20 1136          Subjective: Seen and examined at bedside and he was feeling very tired and complained of lower abdominal pain.  States that she is doing okay but is extremely sleepy today.  No nausea  or vomiting.  Does does not feel good.  No other concerns or complaints at this time.  Objective: Vitals:   04/04/20 1207 04/04/20 2211 04/05/20 0555 04/05/20 1342  BP: (!) 157/68 (!) 138/57 (!) 120/48 (!) 134/59  Pulse: 92 72 65 86  Resp: 16 16 18 18   Temp: 98.5 F (36.9 C) 98.6 F (37 C) 99.1 F (37.3 C) 98 F (36.7 C)  TempSrc: Oral Oral Oral Oral  SpO2: 99% 99% 96% 99%  Weight:      Height:        Intake/Output Summary (Last 24 hours) at 04/05/2020 1829 Last data filed at 04/05/2020 1700 Gross per 24 hour  Intake 890 ml  Output 1600 ml  Net -710 ml   Filed Weights   04/01/20 0532 04/22/2020 0534 04/04/20 0500  Weight: 61.4 kg 61 kg 59.4 kg   Examination: Physical Exam:  Constitutional: WN/WD African-American female currently no acute distress appears calm and slightly somnolent and slightly uncomfortable Eyes: Lids and conjunctivae normal, sclerae anicteric  ENMT: External Ears, Nose appear normal. Grossly normal hearing.  Neck: Appears normal, supple, no cervical masses, normal ROM, no appreciable thyromegaly,: No JVD Respiratory: Diminished to auscultation bilaterally with coarse breath sounds worse on the right compared to left., no wheezing, rales, rhonchi or crackles. Normal respiratory effort and patient is not tachypenic. No accessory muscle  use.  Unlabored breathing Cardiovascular: RRR, no murmurs / rubs / gallops. S1 and S2 auscultated.  Has some mild lower extremity edema Abdomen: Soft, tender to palpate lower abdomen, distended secondary body habitus. Bowel sounds positive.  GU: Deferred. Musculoskeletal: No clubbing / cyanosis of digits/nails. No joint deformity upper and lower extremities.  Skin: Has a sacral ulcer that was not viewed.. No induration; Warm and dry.  Neurologic: CN 2-12 grossly intact with no focal deficits. Romberg sign and cerebellar reflexes not assessed.  Psychiatric: Normal judgment and insight. Alert and oriented x 3. Normal mood and appropriate affect.   Data Reviewed: I have personally reviewed following labs and imaging studies  CBC: Recent Labs  Lab 03/30/20 0500 03/30/20 1643 04/01/20 0410 04/01/20 1549 04/05/2020 0428 04/09/2020 0428 04/03/20 0522 04/03/20 1642 04/04/20 0431 04/04/20 1651 04/05/20 0542  WBC 8.1   < > 9.4  --  10.4  --  14.8*  --  14.7*  --  14.1*  NEUTROABS 4.1  --   --   --   --   --  10.0*  --  9.5*  --  10.2*  HGB 7.0*   < > 9.5*   < > 9.1*   < > 9.7* 10.0* 9.0* 9.1* 8.6*  HCT 22.4*   < > 30.1*   < > 28.9*   < > 31.0* 32.0* 28.6* 29.2* 27.8*  MCV 80.0   < > 82.0  --  81.9  --  80.9  --  80.8  --  82.7  PLT 320   < > 321  --  306  --  326  --  264  --  244   < > = values in this interval not displayed.   Basic Metabolic Panel: Recent Labs  Lab 04/01/20 0410 04/20/2020 0428 04/03/20 0522 04/04/20 0431 04/05/20 0542  NA 137 140 138 136 132*  K 5.5* 5.5* 4.4 4.3 4.0  CL 110 112* 109 104 102  CO2 20* 21* 20* 19* 19*  GLUCOSE 138* 124* 149* 141* 164*  BUN 55* 46* 45* 35* 34*  CREATININE 1.76* 1.54* 1.61* 1.41* 1.87*  CALCIUM 8.9 8.8* 8.7* 8.3* 8.0*  MG  --   --  1.9 1.6* 2.3  PHOS  --   --  3.5 3.4 4.1   GFR: Estimated Creatinine Clearance: 21.5 mL/min (A) (by C-G formula based on SCr of 1.87 mg/dL (H)). Liver Function Tests: Recent Labs  Lab 03/30/20 0500  04/27/2020 0402 04/03/20 0522 04/04/20 0431 04/05/20 0542  AST 19 21 19 18 17   ALT 12 12 13 11 11   ALKPHOS 97 92 103 80 78  BILITOT 0.7 0.6 0.8 0.7 0.6  PROT 6.6 6.2* 6.9 6.1* 6.1*  ALBUMIN 3.1* 3.0* 3.1* 2.8* 2.7*   No results for input(s): LIPASE, AMYLASE in the last 168 hours. Recent Labs  Lab 03/30/20 1240  AMMONIA 15   Coagulation Profile: Recent Labs  Lab 04/27/2020 0402  INR 1.9*   Cardiac Enzymes: No results for input(s): CKTOTAL, CKMB, CKMBINDEX, TROPONINI in the last 168 hours. BNP (last 3 results) No results for input(s): PROBNP in the last 8760 hours. HbA1C: No results for input(s): HGBA1C in the last 72 hours. CBG: Recent Labs  Lab 04/04/20 1159 04/04/20 1700 04/05/20 0733 04/05/20 1106 04/05/20 1614  GLUCAP 154* 116* 143* 140* 168*   Lipid Profile: No results for input(s): CHOL, HDL, LDLCALC, TRIG, CHOLHDL, LDLDIRECT in the last 72 hours. Thyroid Function Tests: No results for input(s): TSH, T4TOTAL, FREET4, T3FREE, THYROIDAB in the last 72 hours. Anemia Panel: No results for input(s): VITAMINB12, FOLATE, FERRITIN, TIBC, IRON, RETICCTPCT in the last 72 hours. Sepsis Labs: Recent Labs  Lab 04/04/20 0431 04/04/20 0625 04/05/20 0542  PROCALCITON 0.78 0.81 0.97  LATICACIDVEN  --  0.8  --     Recent Results (from the past 240 hour(s))  Respiratory Panel by RT PCR (Flu A&B, Covid) - Nasopharyngeal Swab     Status: None   Collection Time: 03/20/2020  2:57 PM   Specimen: Nasopharyngeal Swab  Result Value Ref Range Status   SARS Coronavirus 2 by RT PCR NEGATIVE NEGATIVE Final    Comment: (NOTE) SARS-CoV-2 target nucleic acids are NOT DETECTED.  The SARS-CoV-2 RNA is generally detectable in upper respiratoy specimens during the acute phase of infection. The lowest concentration of SARS-CoV-2 viral copies this assay can detect is 131 copies/mL. A negative result does not preclude SARS-Cov-2 infection and should not be used as the sole basis for  treatment or other patient management decisions. A negative result may occur with  improper specimen collection/handling, submission of specimen other than nasopharyngeal swab, presence of viral mutation(s) within the areas targeted by this assay, and inadequate number of viral copies (<131 copies/mL). A negative result must be combined with clinical observations, patient history, and epidemiological information. The expected result is Negative.  Fact Sheet for Patients:  PinkCheek.be  Fact Sheet for Healthcare Providers:  GravelBags.it  This test is no t yet approved or cleared by the [place] FDA and  has been authorized for detection and/or diagnosis of SARS-CoV-2 by FDA under an Emergency Use Authorization (EUA). This EUA will remain  in effect (meaning this test can be used) for the duration of the COVID-19 declaration under Section 564(b)(1) of the Act, 21 U.S.C. section 360bbb-3(b)(1), unless the authorization is terminated or revoked sooner.     Influenza A by PCR NEGATIVE NEGATIVE Final   Influenza B by PCR NEGATIVE NEGATIVE Final    Comment: (NOTE) The Xpert Xpress SARS-CoV-2/FLU/RSV assay is intended as an aid in  the diagnosis of influenza from Nasopharyngeal swab specimens and  should not be used as a sole basis for treatment. Nasal washings and  aspirates are unacceptable for Xpert Xpress SARS-CoV-2/FLU/RSV  testing.  Fact Sheet for Patients: PinkCheek.be  Fact Sheet for Healthcare Providers: GravelBags.it  This test is not yet approved or cleared by the [place] FDA and  has been authorized for detection and/or diagnosis of SARS-CoV-2 by  FDA under an Emergency Use Authorization (EUA). This EUA will remain  in effect (meaning this test can be used) for the duration of the  Covid-19 declaration under Section 564(b)(1) of the Act, 21    U.S.C. section 360bbb-3(b)(1), unless the authorization is  terminated or revoked. Performed at [facility], [place] [address], [place], [place] [address]   MRSA PCR Screening     Status: Abnormal   Collection Time: 04/19/2020  7:44 AM   Specimen: Nasopharyngeal  Result Value Ref Range Status   MRSA by PCR POSITIVE (A) NEGATIVE Final    Comment:        The GeneXpert MRSA Assay (FDA approved for NASAL specimens only), is one component of a comprehensive MRSA colonization surveillance program. It is not intended to diagnose MRSA infection nor to guide or monitor treatment for MRSA infections. RESULT CALLED TO, READ BACK BY AND VERIFIED WITH: DARK,D. RN @1111  ON 11.01.2021 BY COHEN,K Performed at [facility], [place] [address], [place], [place] [address]   Culture, blood (Routine X 2) w Reflex to ID Panel     Status: None (Preliminary result)   Collection Time: 04/04/20  6:25 AM   Specimen: BLOOD  Result Value Ref Range Status   Specimen Description   Final    BLOOD RIGHT ANTECUBITAL Performed at [place] [address], [place], [place] [address]    Special Requests   Final    BOTTLES DRAWN AEROBIC ONLY Blood Culture adequate volume Performed at [place] [facility] [address] [place], [place] [address]    Culture   Final    NO GROWTH < 24 HOURS Performed at [place] [place] [address] [place], [place] [address]    Report Status PENDING  Incomplete  Culture, blood (Routine X 2) w Reflex to ID Panel     Status: None (Preliminary result)   Collection Time: 04/04/20  6:25 AM   Specimen: BLOOD RIGHT HAND  Result Value Ref Range Status   Specimen Description   Final    BLOOD RIGHT HAND Performed at [place] [address] [place], [place] [address]    Special Requests   Final    BOTTLES DRAWN AEROBIC AND ANAEROBIC Blood Culture adequate volume Performed at [facility] [address] [place], [place] [address]    Culture   Final    NO GROWTH < 24 HOURS Performed at [place] [address] [place], [place] [address]    Report Status PENDING  Incomplete  Culture, Urine     Status: Abnormal (Preliminary result)   Collection Time: 04/04/20  3:08 PM   Specimen: Urine, Random  Result Value Ref Range Status   Specimen Description   Final    URINE, RANDOM Performed at [facility] [address] [place], [place] [address]    Special Requests   Final    NONE Performed at [facility], [place] [address], [place],  [address]    Culture (A)  Final    70,000 COLONIES/mL PROVIDENCIA  STUARTII >=100,000 COLONIES/mL ENTEROCOCCUS FAECALIS SUSCEPTIBILITIES TO FOLLOW Performed at [place] [address] [place], [place] [address]    Report Status PENDING  Incomplete     RN Pressure Injury Documentation: Pressure Injury 04/04/2020 Sacrum Medial Stage III -  Full thickness tissue loss. Subcutaneous fat may be visible but bone, tendon or muscle are NOT exposed. (Active)  04/20/2020 1149  Location: Sacrum  Location Orientation: Medial  Staging: Stage III -  Full thickness tissue loss. Subcutaneous fat may be visible but bone, tendon or muscle are NOT exposed.  Wound Description (Comments):   Present on Admission: Yes     Estimated body mass index is 24.34 kg/m as calculated from the following:   Height as of this encounter: 5' 1.5" (1.562 m).   Weight as of this encounter: 59.4 kg.  Malnutrition Type:      Malnutrition Characteristics:      Nutrition Interventions:    Radiology Studies: CT ABDOMEN PELVIS W CONTRAST  Result Date: 04/04/2020 CLINICAL DATA:  Abdominal pain/fever, anemic. Unable to get more information regarding patient's history at the moment. EXAM: CT ABDOMEN AND PELVIS WITH CONTRAST TECHNIQUE: Multidetector CT imaging of the abdomen and  pelvis was performed using the standard protocol following bolus administration of intravenous contrast. CONTRAST:  59mL OMNIPAQUE IOHEXOL 300 MG/ML  SOLN COMPARISON:  CT abdomen pelvis 12/25/2019, CT abdomen pelvis 02/02/2018 FINDINGS: Lower chest: Interval development of a small right pleural effusion. Interval development of a trace left pleural effusion. Associated passive atelectasis of bilateral lower lobes. At least mild mitral annular calcifications. Coronary artery calcifications. Hepatobiliary: Nodular hepatic contour and left hepatic lobe enlargement. No focal hepatic lesion. Large peripherally calcified gallstone again noted within the gallbladder lumen. No gallbladder wall thickening or pericholecystic fluid. No biliary ductal dilatation. Pancreas: No focal lesion. Normal pancreatic contour. No surrounding inflammatory changes. No main pancreatic ductal dilatation. Spleen: Normal in size without focal abnormality. Adrenals/Urinary Tract: No adrenal nodule bilaterally. Bilateral kidneys enhance symmetrically. Bilateral renal cortical scarring. Subcentimeter hypodensities are too small to characterize. No hydronephrosis. No hydroureter. The urinary bladder is mildly distended with urine with circumferential mild urinary bladder wall thickening no definite perivesicular fat stranding. Stomach/Bowel: PO contrast reaches the distal small bowel. Stomach is within normal limits. No evidence of small bowel wall thickening or dilatation. Fatty infiltration of the ascending colonic wall suggestive of chronic inflammatory changes. No large bowel dilatation. There is persistent perirectal/presacral fat stranding as well as possible posterior focal rectal wall thickening (2:64, 6:83). Improved fecal impaction with now only a 4.5 cm stool ball within the rectum. The appendix is not definitely identified. Vascular/Lymphatic: No abdominal aorta or iliac aneurysm. Moderate atherosclerotic plaque of the aorta and its  branches. No abdominal, pelvic, or inguinal lymphadenopathy. Reproductive: Coarsely calcified posterior uterine wall lesions likely represent degenerative fibroids. Bilateral ovaries and adnexal regions are unremarkable. Other: No intraperitoneal free fluid. No intraperitoneal free gas. No organized fluid collection. Musculoskeletal: Diffuse mild subcutaneus soft tissue edema. Interval erosion of the S1 vertebral body anteriorly of unclear etiology. No acute displaced fracture. Multilevel severe degenerative changes of the spine with similar-appearing sclerotic appearance of the L5 vertebral body as well as stable vertebral body height loss. IMPRESSION: 1. Persistent mild perirectal and presacral fat stranding of unclear etiology (unable to get patient's history at the moment, correlate with possible prior radiation changes). Suggestion of possible focal posterior rectal wall thickening that could represent a real lesion versus under distension of the rectal wall. Recommend clinical  correlation for infection. Consider colonoscopy post treatment to exclude underlying malignancy. 2. Presacral and rectal findings are associated with cortical erosion, sclerosis, and thinning of the S1 vertebral body of unclear etiology (this is new from 2019 CT and worsened from the 2021 CTs; again, unable to get patient's history at the moment, correlate with possible prior radiation changes). 3. Urinary bladder wall thickening circumferentially. Correlate with urinalysis to evaluate for infection. 4. Cirrhosis with markedly limited evaluation for hepatic lesions on this single-phase study. Consider MRI abdomen liver protocol for further evaluation. 5. Cholelithiasis with no findings suggest acute cholecystitis or choledocholithiasis. 6. Stable chronic L5 compression fracture. 7. Degenerative uterine fibroids. 8.  Aortic Atherosclerosis (ICD10-I70.0). Electronically Signed   By: Iven Finn M.D.   On: 04/04/2020 19:09   DG CHEST  PORT 1 VIEW  Result Date: 04/03/2020 CLINICAL DATA:  Fever EXAM: PORTABLE CHEST 1 VIEW COMPARISON:  03/30/2020 FINDINGS: Cardiomegaly. Right basilar atelectasis or infiltrate. Left lung clear. No visible effusions. IMPRESSION: Right lower lobe atelectasis or infiltrate. Cardiomegaly. Electronically Signed   By: Rolm Baptise M.D.   On: 04/03/2020 20:08   Scheduled Meds: . Chlorhexidine Gluconate Cloth  6 each Topical Q0600  . diltiazem  120 mg Oral Daily  . gabapentin  600 mg Oral BID  . guaiFENesin  1,200 mg Oral BID  . insulin aspart  0-9 Units Subcutaneous TID WC  . lactulose  10 g Oral BID  . levothyroxine  88 mcg Oral Q0600  . metoprolol tartrate  25 mg Oral BID  . pantoprazole  40 mg Oral Daily  . polyethylene glycol  17 g Oral Q8H  . rifaximin  550 mg Oral BID  . rivaroxaban  15 mg Oral Q supper  . sodium chloride flush  3 mL Intravenous Q12H  . sodium phosphate  2 enema Rectal Once   Continuous Infusions: . sodium chloride Stopped (04/01/20 0906)  . lactated ringers 10 mL/hr at 04/14/2020 2149    LOS: 7 days   Kerney Elbe, DO [facility] PAGER is on [place]  If 7PM-7AM, please contact night-coverage [email or web address]

## 2020-04-05 NOTE — Progress Notes (Signed)
[facility] Progress Note  Sarah Colon 74 y.o. 1946-04-28   Subjective: Sleeping comfortably. Denies abdominal pain/N/V.  Objective: Vital signs: Vitals:   04/05/20 0555 04/05/20 1342  BP: (!) 120/48 (!) 134/59  Pulse: 65 86  Resp: 18 18  Temp: 99.1 F (37.3 C) 98 F (36.7 C)  SpO2: 96% 99%    Physical Exam: Gen: lethargic, elderly, frail, no acute distress  HEENT: anicteric sclera CV: RRR Chest: CTA B Abd: diffuse tenderness with guarding, soft, nondistended, +BS Ext: no edema  Lab Results: Recent Labs    04/04/20 0431 04/05/20 0542  NA 136 132*  K 4.3 4.0  CL 104 102  CO2 19* 19*  GLUCOSE 141* 164*  BUN 35* 34*  CREATININE 1.41* 1.87*  CALCIUM 8.3* 8.0*  MG 1.6* 2.3  PHOS 3.4 4.1   Recent Labs    04/04/20 0431 04/05/20 0542  AST 18 17  ALT 11 11  ALKPHOS 80 78  BILITOT 0.7 0.6  PROT 6.1* 6.1*  ALBUMIN 2.8* 2.7*   Recent Labs    04/04/20 0431 04/04/20 0431 04/04/20 1651 04/05/20 0542  WBC 14.7*  --   --  14.1*  NEUTROABS 9.5*  --   --  10.2*  HGB 9.0*   < > 9.1* 8.6*  HCT 28.6*   < > 29.2* 27.8*  MCV 80.8  --   --  82.7  PLT 264  --   --  244   < > = values in this interval not displayed.      Assessment/Plan: Anemia and heme positive stool - colonoscopy with poor prep but multiple right-sided adenomatous colon polyps removed. EGD normal. Fevers of unclear etiology on Unasyn. CT with focal rectal wall thickening in the setting of a fecal impaction. Despite poorly prepped colon without complete visualization of the rectum I think the rectal CT findings are due to stool and inflammation from the fecal impaction. If anemia worsens, then will need a flex sig with enemas for prep prior to discharge to re-evaluate the rectum after resolution of the fecal impaction. Will give soap suds enemas and re-visit 04/07/20.   [name] 04/05/2020, 3:52 PM  Questions please call [phone] ID: [name], female    DOB: 08/31/1945, 74 y.o.   MRN: [id number]

## 2020-04-05 NOTE — Progress Notes (Signed)
Pt has had 100 mL of measurable urinary output and one large unmeasured void. Pt bladder scanned, showing 206 mL. MD notified. No new orders at this time. Will continue to monitor.

## 2020-04-05 NOTE — Progress Notes (Signed)
Pharmacy Antibiotic Note  Sarah Colon is a 74 y.o. female admitted on 03/07/2020 with symptomatic anemia.  Pharmacy has been consulted for Unasyn dosing for possible aspiration PNA, possible UTI. Transition to meropenem after MD discussion due to urine culture results.   04/05/20 6:36 PM   Urine culture with Providencia stuartii and Enterococcus faecalis   Plan: Meropenem 1 g iv q 12 hours  Will sign off and follow remotely for renal function, U/A, and culture results  Height: 5' 1.5" (156.2 cm) Weight: 59.4 kg (130 lb 15.3 oz) (bedscale) IBW/kg (Calculated) : 48.95  Temp (24hrs), Avg:98.6 F (37 C), Min:98 F (36.7 C), Max:99.1 F (37.3 C)  Recent Labs  Lab 04/01/20 0410 04/23/2020 0428 04/03/20 0522 04/04/20 0431 04/04/20 0625 04/05/20 0542  WBC 9.4 10.4 14.8* 14.7*  --  14.1*  CREATININE 1.76* 1.54* 1.61* 1.41*  --  1.87*  LATICACIDVEN  --   --   --   --  0.8  --     Estimated Creatinine Clearance: 21.5 mL/min (A) (by C-G formula based on SCr of 1.87 mg/dL (H)).    Allergies  Allergen Reactions  . Codeine Other (See Comments)    Swelling    Thank you for allowing pharmacy to be a part of this patient's care.  Napoleon Form 04/05/2020 6:36 PM

## 2020-04-06 ENCOUNTER — Inpatient Hospital Stay (HOSPITAL_COMMUNITY): Payer: Medicare Other

## 2020-04-06 DIAGNOSIS — D649 Anemia, unspecified: Secondary | ICD-10-CM | POA: Diagnosis not present

## 2020-04-06 DIAGNOSIS — I5032 Chronic diastolic (congestive) heart failure: Secondary | ICD-10-CM | POA: Diagnosis not present

## 2020-04-06 DIAGNOSIS — R109 Unspecified abdominal pain: Secondary | ICD-10-CM | POA: Diagnosis not present

## 2020-04-06 DIAGNOSIS — R197 Diarrhea, unspecified: Secondary | ICD-10-CM | POA: Diagnosis not present

## 2020-04-06 LAB — COMPREHENSIVE METABOLIC PANEL
ALT: 16 U/L (ref 0–44)
AST: 28 U/L (ref 15–41)
Albumin: 2.5 g/dL — ABNORMAL LOW (ref 3.5–5.0)
Alkaline Phosphatase: 97 U/L (ref 38–126)
Anion gap: 11 (ref 5–15)
BUN: 42 mg/dL — ABNORMAL HIGH (ref 8–23)
CO2: 20 mmol/L — ABNORMAL LOW (ref 22–32)
Calcium: 7.7 mg/dL — ABNORMAL LOW (ref 8.9–10.3)
Chloride: 102 mmol/L (ref 98–111)
Creatinine, Ser: 2.95 mg/dL — ABNORMAL HIGH (ref 0.44–1.00)
GFR, Estimated: 16 mL/min — ABNORMAL LOW (ref >60)
Glucose, Bld: 116 mg/dL — ABNORMAL HIGH (ref 70–99)
Potassium: 4.3 mmol/L (ref 3.5–5.1)
Sodium: 133 mmol/L — ABNORMAL LOW (ref 135–145)
Total Bilirubin: 0.9 mg/dL (ref 0.3–1.2)
Total Protein: 5.7 g/dL — ABNORMAL LOW (ref 6.5–8.1)

## 2020-04-06 LAB — CREATININE, URINE, RANDOM: Creatinine, Urine: 167.77 mg/dL

## 2020-04-06 LAB — PHOSPHORUS: Phosphorus: 5.1 mg/dL — ABNORMAL HIGH (ref 2.5–4.6)

## 2020-04-06 LAB — CBC WITH DIFFERENTIAL/PLATELET
Abs Immature Granulocytes: 0.07 10*3/uL (ref 0.00–0.07)
Basophils Absolute: 0 10*3/uL (ref 0.0–0.1)
Basophils Relative: 0 %
Eosinophils Absolute: 0.5 10*3/uL (ref 0.0–0.5)
Eosinophils Relative: 3 %
HCT: 26.4 % — ABNORMAL LOW (ref 36.0–46.0)
Hemoglobin: 8.4 g/dL — ABNORMAL LOW (ref 12.0–15.0)
Immature Granulocytes: 1 %
Lymphocytes Relative: 14 %
Lymphs Abs: 2.2 10*3/uL (ref 0.7–4.0)
MCH: 25.5 pg — ABNORMAL LOW (ref 26.0–34.0)
MCHC: 31.8 g/dL (ref 30.0–36.0)
MCV: 80.2 fL (ref 80.0–100.0)
Monocytes Absolute: 1.3 10*3/uL — ABNORMAL HIGH (ref 0.1–1.0)
Monocytes Relative: 8 %
Neutro Abs: 11.4 10*3/uL — ABNORMAL HIGH (ref 1.7–7.7)
Neutrophils Relative %: 74 %
Platelets: 240 10*3/uL (ref 150–400)
RBC: 3.29 MIL/uL — ABNORMAL LOW (ref 3.87–5.11)
RDW: 27.8 % — ABNORMAL HIGH (ref 11.5–15.5)
WBC: 15.5 10*3/uL — ABNORMAL HIGH (ref 4.0–10.5)
nRBC: 0 % (ref 0.0–0.2)

## 2020-04-06 LAB — URINE CULTURE: Culture: 70000 — AB

## 2020-04-06 LAB — URINALYSIS, ROUTINE W REFLEX MICROSCOPIC
Bacteria, UA: NONE SEEN
Bilirubin Urine: NEGATIVE
Glucose, UA: NEGATIVE mg/dL
Ketones, ur: NEGATIVE mg/dL
Nitrite: NEGATIVE
Protein, ur: 100 mg/dL — AB
Specific Gravity, Urine: >1.046 — ABNORMAL HIGH (ref 1.005–1.030)
WBC, UA: >50 WBC/hpf — ABNORMAL HIGH (ref 0–5)
pH: 5 (ref 5.0–8.0)

## 2020-04-06 LAB — SODIUM, URINE, RANDOM: Sodium, Ur: 85 mmol/L

## 2020-04-06 LAB — MAGNESIUM: Magnesium: 2.2 mg/dL (ref 1.7–2.4)

## 2020-04-06 LAB — GLUCOSE, CAPILLARY
Glucose-Capillary: 114 mg/dL — ABNORMAL HIGH (ref 70–99)
Glucose-Capillary: 143 mg/dL — ABNORMAL HIGH (ref 70–99)
Glucose-Capillary: 136 mg/dL — ABNORMAL HIGH (ref 70–99)
Glucose-Capillary: 130 mg/dL — ABNORMAL HIGH (ref 70–99)

## 2020-04-06 LAB — OSMOLALITY, URINE: Osmolality, Ur: 1327 mOsm/kg — ABNORMAL HIGH (ref 300–900)

## 2020-04-06 MED ORDER — HEPARIN SODIUM (PORCINE) 5000 UNIT/ML IJ SOLN
5000.0000 [IU] | Freq: Three times a day (TID) | INTRAMUSCULAR | Status: DC
  Administered 2020-04-06 – 2020-04-12 (×16): 5000 [IU] via SUBCUTANEOUS
  Filled 2020-04-06 (×16): qty 1

## 2020-04-06 MED ORDER — PIPERACILLIN-TAZOBACTAM IN DEX 2-0.25 GM/50ML IV SOLN
2.2500 g | Freq: Four times a day (QID) | INTRAVENOUS | Status: AC
  Administered 2020-04-06 – 2020-04-11 (×21): 2.25 g via INTRAVENOUS
  Filled 2020-04-06 (×22): qty 50

## 2020-04-06 MED ORDER — GABAPENTIN 300 MG PO CAPS
300.0000 mg | ORAL_CAPSULE | Freq: Two times a day (BID) | ORAL | Status: DC
  Administered 2020-04-06 – 2020-04-07 (×2): 300 mg via ORAL
  Filled 2020-04-06 (×2): qty 1

## 2020-04-06 MED ORDER — SODIUM CHLORIDE 0.9 % IV SOLN: INTRAVENOUS | Status: DC

## 2020-04-06 MED ORDER — SODIUM CHLORIDE 0.9 % IV BOLUS
250.0000 mL | Freq: Once | INTRAVENOUS | Status: AC
  Administered 2020-04-06: 250 mL via INTRAVENOUS

## 2020-04-06 NOTE — Progress Notes (Signed)
PROGRESS NOTE    Sarah Colon  FUX:[id number] DOB: 1946-03-16 DOA: 03/28/2020 PCP: Patient, No Pcp Per   Brief Narrative:  The patient is a 74 year old African-American female with a past medical history significant for but not limited to diabetes mellitus type 2, diastolic CHF, hypertension, paroxysmal atrial fibrillation, chronic kidney disease stage IIIb with a baseline creatinine of 1.5-1.8 as well as other comorbidities who presented to the ED with shortness of breath, chest pain, fatigue over the last week.  She also reported a poor appetite.  She is found to have a hemoglobin of 3 on admission.  There are no reports of blood in her stool or dark or tarry stools.  Fecal occult test in the ED was positive.  GI was consulted and she was admitted to the hospitalist service.  She normally lives in SNF is nonambulatory and in the wheelchair.  She underwent EGD and colonoscopy without evidence of any bleeding.  She had multiple polyp removal from her colonoscopy today.  Her prep was poor.  GI recommending resuming Xarelto 04/03/20 and she received another dose of diuresis yesterday given her volume overload.  Her Xarelto is now resumed today but this evening on 04/04/2019 when she spiked a temperature and she does have a leukocytosis which is chronic but we will panculture to rule out infection.  She is complaining of some abdominal pain so we will get a CT of the abdomen pelvis.  Chest x-ray shows possible pneumonia so she was started on Unasyn and SLP was consulted given the location being the Right Lung Base.  **After further workup it appears the patient has a UTI with Providencia and Entercoccus Faecalis. We will escalate her antibiotics to IV meropenem and hold off of Vancomycin for now. Repeat CT scan showed persistent mild perirectal and presacral fat stranding of unclear etiology and presacral rectal findings associate with cortical erosions, sclerosis and thinning of the S1 vertebral body. As  well as the urinary bladder that was thickened circumferentially. Hemoglobin has dropped a little bit GI feels that the CT findings are in the setting of fecal impaction and due to stool inflammation from the fecal impaction. They feel that her anemia worsens and she will need a flex sig with enemas for prep prior to discharge to reevaluate the rectum after resolution of the fecal impaction. We will escalate antibiotics given her UTI.  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  Her renal function continues to worsen and we are doing further work-up will obtain a bladder scan, renal ultrasound and started the patient back on gentle IV fluids with normal saline at 25 mils per hour.  We are to place a Foley catheter for strict I's and O's however patient refused this.  We will continue monitor and will notify nephrology about her worsening renal function.  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 -Fecal occult was positive.   -Gastroenterology consulted, appreciate  input.   -Received a total of 4 units of packed red blood cells, hemoglobin has improved from 3.1 on admission to 9.5 yesterday and further improved to 9.8; now has been dropping slightly as hemoglobin/hematocrit is now 8.6/27.8 dropped a little further to 8.4/26.4 -Underwent an EGD 11/1 without significant findings as for the source of her GI bleed -Colonoscopy was attempted yesterday  however patient could not do the prep.  She is willing to try again.  Reschedule colonoscopy for 04/20/2020 -Colonoscopy showed inadequate preparation and 2 to 5 mm polyps in the cecum as well as three 5 to 9 mm polyps in the transverse colon removed.  GI recommended repeating colonoscopy in 6 months because the bowel preparation was suboptimal and they have placed the patient on a soft diet -Continue to monitor for signs and symptoms of further bleeding now that we resume her Xarelto -GI recommending a capsule endoscopy for now and recommending a full colonoscopy.  They are recommending advancing diet as tolerated and resuming anticoagulation tomorrow -We will get PT OT to further evaluate and treat and they are recommending SNF -continue to monitor given that we have resumed her anticoagulation -Repeat CT scan done as below -GI reconsulted and will give her a soapsuds enema and feels that if her hemoglobin continues to drop she will need a flex sigmoidoscopy  Left lower quadrant abdominal pain  -No complaints of pain this morning.  CT scan of the abdomen pelvis without acute findings -We will repeat CT of abdomen pelvis given that she continued to have significant abdominal pain and had a new fever and leukocytosis -Patient likely has a UTI and CT findings showed focal rectal wall thickening of acute cellular fluid as well as circumferentially thickened bladder -Continues to have lower abdominal pain and WBC is 15.5 worsening in the setting of her UTI  Liver Cirrhosis  -New diagnosis for her, daughter was unaware.   -MRI of the abdomen per GI showed motion degraded examination and limited but there were no concerning lesions noted and repeat MRI is recommended once acute issues have resolved -MRI of the abdomen can be done in outpatient setting  Acute Metabolic Encephalopathy  -Likely due to profound anemia, her ammonia was normal yesterday.   -Her encephalopathy resolved, she is back to  baseline, alert, appropriate  Acute kidney injury on chronic kidney disease stage IIIb, worsening Metabolic acidosis -Baseline creatinine 1.5-1.8, 2.3 on admission likely in the setting of profound anemia, creatinine has returned to baseline initially and now is worsening and has basically doubled and BUN/creatinine is now 42/2.95  -We will initiate further work-up and obtain serum and urine creatinine, urine osmolality, bladder scan, renal ultrasound and Place Foley but patient has refused Foley. -Avoid further nephrotoxic medications, contrast dyes, hypotension and renally dose medications -She has small metabolic acidosis with a CO2 of 20, anion gap of 11, chloride level 102 next- -She is given a dose of IV diuresis the day before yesterday "we will start IV fluid hydration and normal saline at 75 mils per hour and give a small to 50 bolus -repeat CMP in the a.m.  Hyperkalemia -Mild and was 5.5 and improved after Lokelma -Her potassium is now 4.3.  Received a dose of 10 grams of Lokelma on 04/04/2020 -Continue to monitor and trend and repeat CMP in a.m.  Fever in the setting of likely UTI with Providencia and Enterococcus -Patient spiked a temperature and became tachycardic.  She does have a leukocytosis but has a chronic leukocytosis  has been following up with hematology in outpatient setting  -we will panculture and obtain blood cultures x2, urinalysis and urine culture.  Chest x-ray, lactic acid level as well as procalcitonin level  -Acid level was normal at 0.8  -Urinalysis done and showed a cloudy appearance with small hemoglobin, large leukocytes, positive nitrites, many bacteria, 11-20 RBCs per high-power field, and 50 WBCs as well as a urine culture which showed Providencia and Enterococcus; Enterococcus was VRE -will change IV Unasyn to IV Meropenem and +/- Vanc depending on response to Meropenem; will stop IV meropenem and changed to IV Zosyn given her sensitivities -Procalcitonin  level is slightly elevated and trended up to 0.81 and is further worsening to 0.97 -We will continue to follow cultures and monitor temperature curve  -Continue with acetaminophen for fever. - she was complaining of significant abdominal pain so we will obtain a CT of the abdomen pelvis today with contrast and repeat since he has a new fever -CT scan findings as below -Have notified GI and she could have SBP but will have GI review and make further recommendations -She has been afebrile today but continues to have leukocytosis and this is elevated again.  She had been afebrile and did not have a leukocytosis throughout the entire hospitalization till yesterday -BC is worsening and went from 14.7 and is now 15.5 -Chest x-ray done and showed "Right lower lobe atelectasis or infiltrate. Cardiomegaly." -Blood cultures x2 showed NGTD at 2 days -Continue monitor and trend temperature curve and leukocytosis  Paroxysmal A. Fib -in sinus now, held her Xarelto and this is now been resumed,  -her diltiazem was held on admission due to hypotension but since her blood pressure was creeping back up, added back diltiazem in addition to her home metoprolol -GI recommending resuming Xarelto 04/03/20 and this has been done  Acute on Chronic Diastolic CHF -Appears fluid overloaded with lower extremity edema -BNP is elevated 330,  -Has pleural effusion on chest x-ray.   -She is on room air, fluid status looks a little bit better.  -She is intermittently diuresed while she is hospitalized but will start IV fluid hydration given her AKI on CKD stage III -Patient is + 2.894 liters still; weight has been fluctuating and down 3 lbs -Continue to monitor for signs and symptoms of volume overload and repeat chest x-ray intermittently   Leukocytosis -at her baseline, seen by hematology as an outpatient regularly -WBC worsened during her hospitalization and went from 7.9 and trended all the way up to 14.8; yesterday  is 14.7 but she did spike a temperature and this is likely infectious as WBC is only improved to 14.1 and has now worsened to 15.5 -She was pancultured and will treat empirically for aspiration pneumonia but likley has UTI given Lower Abdominal Pain and  -She will need to follow-up with hematology in the outpatient setting as she has had a relatively elevated leukocytosis since 2014 -Urine culture sensitivities to IV Zosyn  Hypothyroidism -continue Synthroid.  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 Subcutaneous fat may be visible but bone, tendon or muscle are NOT exposed. (Active)  04/22/2020 1149  Location: Sacrum  Location Orientation: Medial  Staging: Stage III -  Full thickness tissue loss. Subcutaneous fat may be visible but bone, tendon or muscle are NOT exposed.  Wound Description (Comments):  Present on Admission: Yes    DVT prophylaxis: SCDs and now have resumed her home rivaroxaban Code Status: DO NOT RESUSCITATE Family Communication: No family present at Bedside and attempted to call daughter Britt Boozer and she did not answer Disposition Plan: Needs To be stable from an infectious standpoint prior to discharging back to SNF and cleared by GI in case she needs another flex sigmoidoscopy Status is: Inpatient  Remains inpatient appropriate because:Unsafe d/c plan, IV treatments appropriate due to intensity of illness or inability to take PO and Inpatient level of care appropriate due to severity of illness   Dispo: The patient is from: SNF              Anticipated d/c is to: SNF              Anticipated d/c date is: 1-2 days              Patient currently is not medically stable to d/c.  Consultants:   Gastroenterology; Re-consulted    Procedures:  COLONOSCOPY Findings:      Skin tags were found on perianal exam.      A large  amount of solid stool was found in the rectum, in the       recto-sigmoid colon, in the sigmoid colon and in the descending colon,       interfering with visualization.      Three sessile polyps were found in the cecum. The polyps were 2 to 5 mm       in size. These polyps were removed with a hot snare. Resection and       retrieval were complete.      Six sessile polyps were found in the ascending colon. The polyps were 3       to 8 mm in size. These polyps were removed with a hot snare. Resection       and retrieval were complete.      Three sessile polyps were found in the transverse colon. The polyps were       5 to 9 mm in size. These polyps were removed with a hot snare. Resection       and retrieval were complete.      No additional abnormalities were found on retroflexion except for large       amount of stool. Impression:               - Preparation of the colon was inadequate.                           - Perianal skin tags found on perianal exam.                           - Stool in the rectum, in the recto-sigmoid colon,                            in the sigmoid colon and in the descending colon.                           - Three 2 to 5 mm polyps in the cecum, removed with                            a hot snare. Resected  and retrieved.                           - Six 3 to 8 mm polyps in the ascending colon,                            removed with a hot snare. Resected and retrieved.                           - Three 5 to 9 mm polyps in the transverse colon,                            removed with a hot snare. Resected and retrieved. Moderate Sedation:      Moderate (conscious) sedation was personally administered by an       anesthesia professional. The following parameters were monitored: oxygen       saturation, heart rate, blood pressure, and response to care. Recommendation:           - Return patient to hospital ward for ongoing care.                           - Soft  diet.                           - Continue present medications.                           - Await pathology results.                           - Repeat colonoscopy in 6 months because the bowel                            preparation was suboptimal.                           - Return to GI office in 2 months.  EGD Findings:      The Z-line was regular and was found 38 cm from the incisors.      The exam of the esophagus was otherwise normal.      Normal mucosa was found in the entire examined stomach. Biopsies were       taken with a cold forceps for histology.      The cardia and gastric fundus were normal on retroflexion.      The duodenal bulb, first portion of the duodenum and second portion of       the duodenum were normal. Impression:               - Z-line regular, 38 cm from the incisors.                           - Normal mucosa was found in the entire stomach.                            Biopsied.                           -  Normal duodenal bulb, first portion of the                            duodenum and second portion of the duodenum.  Antimicrobials:  Anti-infectives (From admission, onward)   Start     Dose/Rate Route Frequency Ordered Stop   04/06/20 1430  piperacillin-tazobactam (ZOSYN) IVPB 2.25 g        2.25 g 100 mL/hr over 30 Minutes Intravenous Every 6 hours 04/06/20 1320     04/05/20 1930  meropenem (MERREM) 1 g in sodium chloride 0.9 % 100 mL IVPB  Status:  Discontinued        1 g 200 mL/hr over 30 Minutes Intravenous Every 12 hours 04/05/20 1832 04/06/20 1320   04/04/20 1000  Ampicillin-Sulbactam (UNASYN) 3 g in sodium chloride 0.9 % 100 mL IVPB  Status:  Discontinued        3 g 200 mL/hr over 30 Minutes Intravenous Every 12 hours 04/04/20 0851 04/05/20 1829   03/30/20 1400  rifaximin (XIFAXAN) tablet 550 mg        550 mg Oral 2 times daily 03/30/20 1136          Subjective: Seen and examined at bedside and again was complaining of some abdominal pain  in the lower abdomen and was feeling tired and fatigued.  Did not feel well.  Discussed with her about her urinary tract infection or worsening kidney function started back on fluids.  Wanted to place a follicular letter but she adamantly refused in the afternoon.  Denies any chest pain.  Just does not feel well.  No other concerns or complaints at this time and is fatigued appearing.  Objective: Vitals:   04/05/20 1342 04/05/20 2055 04/06/20 0624 04/06/20 1329  BP: (!) 134/59 (!) 125/57 (!) 127/41 (!) 145/72  Pulse: 86 70 63 82  Resp: 18 20 16 18   Temp: 98 F (36.7 C) 98.2 F (36.8 C) 98.7 F (37.1 C) 98.7 F (37.1 C)  TempSrc: Oral Oral Oral Oral  SpO2: 99% 99% 98% 99%  Weight:      Height:        Intake/Output Summary (Last 24 hours) at 04/06/2020 1718 Last data filed at 04/06/2020 1545 Gross per 24 hour  Intake 817.08 ml  Output --  Net 817.08 ml   Filed Weights   04/01/20 0532 04/18/2020 0534 04/04/20 0500  Weight: 61.4 kg 61 kg 59.4 kg   Examination: Physical Exam:  Constitutional: WN/WD African-American female currently appears calm and somnolent but does appear little uncomfortable complain of some mid abdominal to lower abdominal pain Eyes: Lids and conjunctivae normal, sclerae anicteric  ENMT: External Ears, Nose appear normal. Grossly normal hearing. Neck: Appears normal, supple, no cervical masses, normal ROM, no appreciable thyromegaly; no appreciable JVD Respiratory: Diminished to auscultation bilaterally, no wheezing, rales, rhonchi or crackles. Normal respiratory effort and patient is not tachypenic. No accessory muscle use.  Unlabored breathing Cardiovascular: RRR, no murmurs / rubs / gallops. S1 and S2 auscultated.  Mild to 1+ lower extremity edema Abdomen: Soft, tender to palpate in the lower quadrants and mid abdomen, distended secondary body habitus. Bowel sounds positive.  GU: Deferred. Musculoskeletal: No clubbing / cyanosis of digits/nails. No joint  deformity upper and lower extremities. Skin: Has a sacral ulcer.  No induration; Warm and dry.  Neurologic: CN 2-12 grossly intact with no focal deficits. Romberg sign and cerebellar reflexes not assessed.  Psychiatric: Normal  judgment and insight. Alert and oriented x 3. Normal mood and appropriate affect.   Data Reviewed: I have personally reviewed following labs and imaging studies  CBC: Recent Labs  Lab 04/04/2020 0428 04/03/2020 0428 04/03/20 0522 04/03/20 0522 04/03/20 1642 04/04/20 0431 04/04/20 1651 04/05/20 0542 04/06/20 0532  WBC 10.4  --  14.8*  --   --  14.7*  --  14.1* 15.5*  NEUTROABS  --   --  10.0*  --   --  9.5*  --  10.2* 11.4*  HGB 9.1*   < > 9.7*   < > 10.0* 9.0* 9.1* 8.6* 8.4*  HCT 28.9*   < > 31.0*   < > 32.0* 28.6* 29.2* 27.8* 26.4*  MCV 81.9  --  80.9  --   --  80.8  --  82.7 80.2  PLT 306  --  326  --   --  264  --  244 240   < > = values in this interval not displayed.   Basic Metabolic Panel: Recent Labs  Lab 04/14/2020 0428 04/03/20 0522 04/04/20 0431 04/05/20 0542 04/06/20 0532  NA 140 138 136 132* 133*  K 5.5* 4.4 4.3 4.0 4.3  CL 112* 109 104 102 102  CO2 21* 20* 19* 19* 20*  GLUCOSE 124* 149* 141* 164* 116*  BUN 46* 45* 35* 34* 42*  CREATININE 1.54* 1.61* 1.41* 1.87* 2.95*  CALCIUM 8.8* 8.7* 8.3* 8.0* 7.7*  MG  --  1.9 1.6* 2.3 2.2  PHOS  --  3.5 3.4 4.1 5.1*   GFR: Estimated Creatinine Clearance: 13.6 mL/min (A) (by C-G formula based on SCr of 2.95 mg/dL (H)). Liver Function Tests: Recent Labs  Lab 04/05/2020 0402 04/03/20 0522 04/04/20 0431 04/05/20 0542 04/06/20 0532  AST 21 19 18 17 28   ALT 12 13 11 11 16   ALKPHOS 92 103 80 78 97  BILITOT 0.6 0.8 0.7 0.6 0.9  PROT 6.2* 6.9 6.1* 6.1* 5.7*  ALBUMIN 3.0* 3.1* 2.8* 2.7* 2.5*   No results for input(s): LIPASE, AMYLASE in the last 168 hours. No results for input(s): AMMONIA in the last 168 hours. Coagulation Profile: Recent Labs  Lab 04/09/2020 0402  INR 1.9*   Cardiac  Enzymes: No results for input(s): CKTOTAL, CKMB, CKMBINDEX, TROPONINI in the last 168 hours. BNP (last 3 results) No results for input(s): PROBNP in the last 8760 hours. HbA1C: No results for input(s): HGBA1C in the last 72 hours. CBG: Recent Labs  Lab 04/05/20 1614 04/05/20 2111 04/06/20 0741 04/06/20 1153 04/06/20 1622  GLUCAP 168* 127* 114* 143* 136*   Lipid Profile: No results for input(s): CHOL, HDL, LDLCALC, TRIG, CHOLHDL, LDLDIRECT in the last 72 hours. Thyroid Function Tests: No results for input(s): TSH, T4TOTAL, FREET4, T3FREE, THYROIDAB in the last 72 hours. Anemia Panel: No results for input(s): VITAMINB12, FOLATE, FERRITIN, TIBC, IRON, RETICCTPCT in the last 72 hours. Sepsis Labs: Recent Labs  Lab 04/04/20 0431 04/04/20 0625 04/05/20 0542  PROCALCITON 0.78 0.81 0.97  LATICACIDVEN  --  0.8  --     Recent Results (from the past 240 hour(s))  Respiratory Panel by RT PCR (Flu A&B, Covid) - Nasopharyngeal Swab     Status: None   Collection Time: 03/08/2020  2:57 PM   Specimen: Nasopharyngeal Swab  Result Value Ref Range Status   SARS Coronavirus 2 by RT PCR NEGATIVE NEGATIVE Final    Comment: (NOTE) SARS-CoV-2 target nucleic acids are NOT DETECTED.  The SARS-CoV-2 RNA is generally detectable in  upper respiratoy specimens during the acute phase of infection. The lowest concentration of SARS-CoV-2 viral copies this assay can detect is 131 copies/mL. A negative result does not preclude SARS-Cov-2 infection and should not be used as the sole basis for treatment or other patient management decisions. A negative result may occur with  improper specimen collection/handling, submission of specimen other than nasopharyngeal swab, presence of viral mutation(s) within the areas targeted by this assay, and inadequate number of viral copies (<131 copies/mL). A negative result must be combined with clinical observations, patient history, and epidemiological information.  The expected result is Negative.  Fact Sheet for Patients:  PinkCheek.be  Fact Sheet for Healthcare Providers:  GravelBags.it  This test is no t yet approved or cleared by the [place] FDA and  has been authorized for detection and/or diagnosis of SARS-CoV-2 by FDA under an Emergency Use Authorization (EUA). This EUA will remain  in effect (meaning this test can be used) for the duration of the COVID-19 declaration under Section 564(b)(1) of the Act, 21 U.S.C. section 360bbb-3(b)(1), unless the authorization is terminated or revoked sooner.     Influenza A by PCR NEGATIVE NEGATIVE Final   Influenza B by PCR NEGATIVE NEGATIVE Final    Comment: (NOTE) The Xpert Xpress SARS-CoV-2/FLU/RSV assay is intended as an aid in  the diagnosis of influenza from Nasopharyngeal swab specimens and  should not be used as a sole basis for treatment. Nasal washings and  aspirates are unacceptable for Xpert Xpress SARS-CoV-2/FLU/RSV  testing.  Fact Sheet for Patients: PinkCheek.be  Fact Sheet for Healthcare Providers: GravelBags.it  This test is not yet approved or cleared by the [place] FDA and  has been authorized for detection and/or diagnosis of SARS-CoV-2 by  FDA under an Emergency Use Authorization (EUA). This EUA will remain  in effect (meaning this test can be used) for the duration of the  Covid-19 declaration under Section 564(b)(1) of the Act, 21  U.S.C. section 360bbb-3(b)(1), unless the authorization is  terminated or revoked. Performed at [facility], [place] [address] [place], [place] [address]   MRSA PCR Screening     Status: Abnormal   Collection Time: 04/06/2020  7:44 AM   Specimen: Nasopharyngeal  Result Value Ref Range Status   MRSA by PCR POSITIVE (A) NEGATIVE Final    Comment:        The GeneXpert MRSA Assay (FDA approved for  NASAL specimens only), is one component of a comprehensive MRSA colonization surveillance program. It is not intended to diagnose MRSA infection nor to guide or monitor treatment for MRSA infections. RESULT CALLED TO, READ BACK BY AND VERIFIED WITH: DARK,D. RN @1111  ON 11.01.2021 BY COHEN,K Performed at [facility], [place] [address], [place], [place] [address]   Culture, blood (Routine X 2) w Reflex to ID Panel     Status: None (Preliminary result)   Collection Time: 04/04/20  6:25 AM   Specimen: BLOOD  Result Value Ref Range Status   Specimen Description   Final    BLOOD RIGHT ANTECUBITAL Performed at [facility] [address], [place], [place] [address]    Special Requests   Final    BOTTLES DRAWN AEROBIC ONLY Blood Culture adequate volume Performed at [facility] [address], [place], [place] [address]    Culture   Final    NO GROWTH 2 DAYS Performed at [facility] [address], [place], [place] [address]  Report Status PENDING  Incomplete  Culture, blood (Routine X 2) w Reflex to ID Panel     Status: None (Preliminary result)   Collection Time: 04/04/20  6:25 AM   Specimen: BLOOD RIGHT HAND  Result Value Ref Range Status   Specimen Description   Final    BLOOD RIGHT HAND Performed at [facility] [address] [place], [place] [address]    Special Requests   Final    BOTTLES DRAWN AEROBIC AND ANAEROBIC Blood Culture adequate volume Performed at [facility] [address], [place], [place] [address]    Culture   Final    NO GROWTH 2 DAYS Performed at [facility] [address], [place], [place] [address]    Report Status PENDING  Incomplete  Culture, Urine     Status: Abnormal   Collection Time: 04/04/20  3:08 PM   Specimen: Urine, Random  Result Value Ref Range Status   Specimen Description   Final    URINE, RANDOM Performed at [facility] [address], [place], [place] [address]    Special Requests   Final    NONE Performed at [facility], [place] [address], [place], [place] [address]    Culture (A)  Final    70,000 COLONIES/mL PROVIDENCIA STUARTII >=100,000 COLONIES/mL VANCOMYCIN RESISTANT ENTEROCOCCUS    Report Status 04/06/2020 FINAL  Final   Organism ID, Bacteria PROVIDENCIA STUARTII (A)  Final   Organism ID, Bacteria VANCOMYCIN RESISTANT ENTEROCOCCUS (A)  Final      Susceptibility   Providencia stuartii - MIC*    AMPICILLIN >=32 RESISTANT Resistant     CEFAZOLIN >=64 RESISTANT Resistant     CEFEPIME 0.25 SENSITIVE Sensitive     CEFTRIAXONE >=64 RESISTANT Resistant     CIPROFLOXACIN >=4 RESISTANT Resistant     GENTAMICIN RESISTANT Resistant     IMIPENEM 2 SENSITIVE Sensitive     NITROFURANTOIN 128 RESISTANT Resistant     TRIMETH/SULFA 160 RESISTANT Resistant     AMPICILLIN/SULBACTAM >=32 RESISTANT Resistant     PIP/TAZO <=4 SENSITIVE Sensitive     * 70,000 COLONIES/mL PROVIDENCIA STUARTII   Vancomycin resistant enterococcus - MIC*    AMPICILLIN <=2 SENSITIVE Sensitive     NITROFURANTOIN <=16 SENSITIVE Sensitive     VANCOMYCIN >=32 RESISTANT Resistant     LINEZOLID 2 SENSITIVE Sensitive     * >=100,000 COLONIES/mL VANCOMYCIN RESISTANT ENTEROCOCCUS     RN Pressure Injury Documentation: Pressure Injury 04/01/2020 Sacrum Medial Stage III -  Full thickness tissue loss. Subcutaneous fat may be visible but bone, tendon or muscle are NOT exposed. (Active)  04/21/2020 1149  Location: Sacrum  Location Orientation: Medial  Staging: Stage III -  Full thickness tissue loss. Subcutaneous fat may be visible but bone, tendon or muscle are NOT exposed.  Wound Description (Comments):   Present on Admission: Yes     Estimated body mass index is 24.34 kg/m as calculated from the following:   Height as of this encounter: 5' 1.5" (1.562 m).   Weight as of this encounter: 59.4  kg.  Malnutrition Type:      Malnutrition Characteristics:      Nutrition Interventions:    Radiology Studies: No results found. Scheduled Meds: . diltiazem  120 mg Oral Daily  . gabapentin  300 mg Oral BID  . guaiFENesin  1,200 mg Oral BID  . heparin injection (subcutaneous)  5,000 Units Subcutaneous Q8H  . insulin aspart  0-9 Units Subcutaneous TID WC  . lactulose  10 g Oral BID  . levothyroxine  88 mcg Oral Q0600  . metoprolol tartrate  25 mg Oral BID  . pantoprazole  40 mg Oral Daily  . polyethylene glycol  17 g Oral Q8H  . rifaximin  550 mg Oral BID  . sodium chloride flush  3 mL Intravenous Q12H  . sodium phosphate  2 enema Rectal Once   Continuous Infusions: . sodium chloride Stopped (04/01/20 0906)  . sodium chloride 75 mL/hr at 04/06/20 1034  . lactated ringers 10 mL/hr at 04/08/2020 2149  . piperacillin-tazobactam 2.25 g (04/06/20 1459)  . sodium chloride      LOS: 8 days   Kerney Elbe, DO [facility] PAGER is on [place]  If 7PM-7AM, please contact night-coverage [email or web address]

## 2020-04-06 NOTE — Progress Notes (Signed)
275 ml via bladder scan. Night provider notified.

## 2020-04-06 NOTE — Progress Notes (Signed)
Pt has had one unmeasureable void this shift. 160 mL present on bladder scan. 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 Pt is adamantly refusing to have a foley catheter inserted stating that she just wants to be comfortable. MD notified. Will continue to monitor.

## 2020-04-06 NOTE — Progress Notes (Signed)
Lab came to draw labs on pt.  Pt adamantly refused. The lab did try to get blood on dayshift and pt wanted to wait until [place] lab tech  Arrived to attempt blood draw and informed this Probation officer that she refused.

## 2020-04-07 DIAGNOSIS — I5032 Chronic diastolic (congestive) heart failure: Secondary | ICD-10-CM | POA: Diagnosis not present

## 2020-04-07 DIAGNOSIS — R197 Diarrhea, unspecified: Secondary | ICD-10-CM | POA: Diagnosis not present

## 2020-04-07 DIAGNOSIS — R109 Unspecified abdominal pain: Secondary | ICD-10-CM | POA: Diagnosis not present

## 2020-04-07 DIAGNOSIS — D649 Anemia, unspecified: Secondary | ICD-10-CM | POA: Diagnosis not present

## 2020-04-07 LAB — COMPREHENSIVE METABOLIC PANEL
ALT: 21 U/L (ref 0–44)
AST: 36 U/L (ref 15–41)
Albumin: 2.4 g/dL — ABNORMAL LOW (ref 3.5–5.0)
Alkaline Phosphatase: 107 U/L (ref 38–126)
Anion gap: 10 (ref 5–15)
BUN: 43 mg/dL — ABNORMAL HIGH (ref 8–23)
CO2: 18 mmol/L — ABNORMAL LOW (ref 22–32)
Calcium: 7.5 mg/dL — ABNORMAL LOW (ref 8.9–10.3)
Chloride: 98 mmol/L (ref 98–111)
Creatinine, Ser: 3.62 mg/dL — ABNORMAL HIGH (ref 0.44–1.00)
GFR, Estimated: 12 mL/min — ABNORMAL LOW (ref >60)
Glucose, Bld: 98 mg/dL (ref 70–99)
Potassium: 4.1 mmol/L (ref 3.5–5.1)
Sodium: 126 mmol/L — ABNORMAL LOW (ref 135–145)
Total Bilirubin: 0.7 mg/dL (ref 0.3–1.2)
Total Protein: 5.7 g/dL — ABNORMAL LOW (ref 6.5–8.1)

## 2020-04-07 LAB — CBC WITH DIFFERENTIAL/PLATELET
Abs Immature Granulocytes: 0.1 10*3/uL — ABNORMAL HIGH (ref 0.00–0.07)
Basophils Absolute: 0 10*3/uL (ref 0.0–0.1)
Basophils Relative: 0 %
Eosinophils Absolute: 0.8 10*3/uL — ABNORMAL HIGH (ref 0.0–0.5)
Eosinophils Relative: 5 %
HCT: 25.8 % — ABNORMAL LOW (ref 36.0–46.0)
Hemoglobin: 8 g/dL — ABNORMAL LOW (ref 12.0–15.0)
Immature Granulocytes: 1 %
Lymphocytes Relative: 15 %
Lymphs Abs: 2.1 10*3/uL (ref 0.7–4.0)
MCH: 24.9 pg — ABNORMAL LOW (ref 26.0–34.0)
MCHC: 31 g/dL (ref 30.0–36.0)
MCV: 80.4 fL (ref 80.0–100.0)
Monocytes Absolute: 1.3 10*3/uL — ABNORMAL HIGH (ref 0.1–1.0)
Monocytes Relative: 9 %
Neutro Abs: 10 10*3/uL — ABNORMAL HIGH (ref 1.7–7.7)
Neutrophils Relative %: 70 %
Platelets: 251 10*3/uL (ref 150–400)
RBC: 3.21 MIL/uL — ABNORMAL LOW (ref 3.87–5.11)
RDW: 27.7 % — ABNORMAL HIGH (ref 11.5–15.5)
WBC: 14.4 10*3/uL — ABNORMAL HIGH (ref 4.0–10.5)
nRBC: 0 % (ref 0.0–0.2)

## 2020-04-07 LAB — GLUCOSE, CAPILLARY
Glucose-Capillary: 103 mg/dL — ABNORMAL HIGH (ref 70–99)
Glucose-Capillary: 99 mg/dL (ref 70–99)
Glucose-Capillary: 100 mg/dL — ABNORMAL HIGH (ref 70–99)
Glucose-Capillary: 86 mg/dL (ref 70–99)

## 2020-04-07 LAB — OSMOLALITY: Osmolality: 280 mOsm/kg (ref 275–295)

## 2020-04-07 LAB — BASIC METABOLIC PANEL
Anion gap: 11 (ref 5–15)
BUN: 50 mg/dL — ABNORMAL HIGH (ref 8–23)
CO2: 19 mmol/L — ABNORMAL LOW (ref 22–32)
Calcium: 7.7 mg/dL — ABNORMAL LOW (ref 8.9–10.3)
Chloride: 98 mmol/L (ref 98–111)
Creatinine, Ser: 3.76 mg/dL — ABNORMAL HIGH (ref 0.44–1.00)
GFR, Estimated: 12 mL/min — ABNORMAL LOW (ref >60)
Glucose, Bld: 101 mg/dL — ABNORMAL HIGH (ref 70–99)
Potassium: 4.4 mmol/L (ref 3.5–5.1)
Sodium: 128 mmol/L — ABNORMAL LOW (ref 135–145)

## 2020-04-07 LAB — PHOSPHORUS: Phosphorus: 5.4 mg/dL — ABNORMAL HIGH (ref 2.5–4.6)

## 2020-04-07 LAB — HEMOGLOBIN AND HEMATOCRIT, BLOOD
HCT: 29 % — ABNORMAL LOW (ref 36.0–46.0)
Hemoglobin: 9.2 g/dL — ABNORMAL LOW (ref 12.0–15.0)

## 2020-04-07 LAB — MAGNESIUM: Magnesium: 2 mg/dL (ref 1.7–2.4)

## 2020-04-07 MED ORDER — GABAPENTIN 300 MG PO CAPS
300.0000 mg | ORAL_CAPSULE | Freq: Every day | ORAL | Status: DC
  Administered 2020-04-08 – 2020-04-25 (×18): 300 mg via ORAL
  Filled 2020-04-07 (×18): qty 1

## 2020-04-07 MED ORDER — SODIUM BICARBONATE 650 MG PO TABS
650.0000 mg | ORAL_TABLET | Freq: Three times a day (TID) | ORAL | Status: DC
  Administered 2020-04-07 – 2020-04-10 (×8): 650 mg via ORAL
  Filled 2020-04-07 (×9): qty 1

## 2020-04-07 NOTE — Progress Notes (Addendum)
   04/07/20 0659  Provider Notification  Provider Name/Title [facility]  Date Provider Notified 04/07/20  Time Provider Notified [phone]  Notification Type Page  Notification Reason Other (Comment) (no urine output)  Response Other (Comment) (no response )  Will notify am nurse about no urine output and that pt is refusing foley.  Last bladder scan 168.  Pt still denies pain in suprapubic region, no guarding noted.  Pt educated on reason for foley.  Will pass along to dayshift for follow up.  Pt vitals remain stable.  No acute distress noted.

## 2020-04-07 NOTE — Progress Notes (Signed)
PROGRESS NOTE    Sarah Colon  JJH:[id number] DOB: May 28, 1946 DOA: 03/24/2020 PCP: Patient, No Pcp Per   Brief Narrative:  The patient is a 74 year old African-American female with a past medical history significant for but not limited to diabetes mellitus type 2, diastolic CHF, hypertension, paroxysmal atrial fibrillation, chronic kidney disease stage IIIb with a baseline creatinine of 1.5-1.8 as well as other comorbidities who presented to the ED with shortness of breath, chest pain, fatigue over the last week.  She also reported a poor appetite.  She is found to have a hemoglobin of 3 on admission.  There are no reports of blood in her stool or dark or tarry stools.  Fecal occult test in the ED was positive.  GI was consulted and she was admitted to the hospitalist service.  She normally lives in SNF is nonambulatory and in the wheelchair.  She underwent EGD and colonoscopy without evidence of any bleeding.  She had multiple polyp removal from her colonoscopy today.  Her prep was poor.  GI recommending resuming Xarelto 04/03/20 and she received another dose of diuresis yesterday given her volume overload.  Her Xarelto is now resumed today but this evening on 04/04/2019 when she spiked a temperature and she does have a leukocytosis which is chronic but we will panculture to rule out infection.  She is complaining of some abdominal pain so we will get a CT of the abdomen pelvis.  Chest x-ray shows possible pneumonia so she was started on Unasyn and SLP was consulted given the location being the Right Lung Base.  **After further workup it appears the patient has a UTI with Providencia and Entercoccus Faecalis. We will escalate her antibiotics to IV meropenem and hold off of Vancomycin for now. Repeat CT scan showed persistent mild perirectal and presacral fat stranding of unclear etiology and presacral rectal findings associate with cortical erosions, sclerosis and thinning of the S1 vertebral body. As  well as the urinary bladder that was thickened circumferentially. Hemoglobin has dropped a little bit GI feels that the CT findings are in the setting of fecal impaction and due to stool inflammation from the fecal impaction. They feel that her anemia worsens and she will need a flex sig with enemas for prep prior to discharge to reevaluate the rectum after resolution of the fecal impaction. We will escalate antibiotics given her UTI.  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  Her renal function continues to worsen and we are doing further work-up will obtain a bladder scan, renal ultrasound and started the patient back on gentle IV fluids with normal saline at 25 mils per hour.  We are to place a Foley catheter for strict I's and O's however patient refused this.  We will continue monitor and will notify nephrology about her worsening renal function.  Nephrology feels that Contrast from last CT scan caused her AKI and GI has signed off and feel as if her Abdominal Pain is multifactorial. They are going to hold off on Flex Sigmoidoscopy.   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 -Fecal occult was positive.   -Gastroenterology consulted, appreciate input.   -Received a total of 4 units of packed red blood cells, hemoglobin has improved from 3.1 on admission to 9.5; Now Hgb/Hct is trending down and is 8.0/25.8 this morning and repeat this afternoon  was 9.2/29.0 -Underwent an EGD 11/1 without significant findings as for the source of her GI bleed -Colonoscopy was attempted yesterday however patient could not do the prep.  She is willing to try again.  Reschedule colonoscopy for 04/25/2020 -Colonoscopy showed inadequate preparation and 2 to 5 mm polyps in the cecum as well as three 5 to 9 mm polyps in the transverse colon removed.  GI recommended repeating colonoscopy in 6 months because the bowel preparation was suboptimal and they have placed the patient on a soft diet -Continue to monitor for signs and symptoms of further bleeding now that we resume her Xarelto -GI recommending a capsule endoscopy for now and recommending a full colonoscopy.  They are recommending advancing diet as tolerated and resuming anticoagulation tomorrow -We will get PT OT to further evaluate and treat and they are recommending SNF -continue to monitor given that we have resumed her anticoagulation -Repeat CT scan done as below -GI reconsulted and will give her a soapsuds enema and feels that if her hemoglobin continues to drop she will need a flex sigmoidoscopy; They are going to hold off on Flex Sigmoidoscopy and have signed off   Left lower quadrant abdominal pain, Multifactorial  -No complaints of pain this morning.  CT scan of the abdomen pelvis without acute findings -We will repeat CT of abdomen pelvis given that she continued to have significant abdominal pain and had a new fever and leukocytosis -Patient likely has a UTI and CT findings showed focal rectal wall thickening of acute cellular fluid as well as circumferentially thickened bladder -Continues to have lower abdominal pain and WBC was 15.5 and is now 14.4 worsening in the setting of her UTI  Liver Cirrhosis  -New diagnosis for her, daughter was unaware.   -MRI of the abdomen per GI showed motion degraded examination and limited but there were no concerning lesions noted and repeat MRI is recommended  once acute issues have resolved -MRI of the abdomen can be done in outpatient setting  Acute Metabolic Encephalopathy  -Likely due to profound anemia, her ammonia was normal yesterday.   -Her encephalopathy resolved, she is back to baseline, alert, appropriate  Acute kidney injury on chronic kidney disease stage IIIb, worsening Metabolic acidosis -Baseline creatinine 1.5-1.8, 2.3 on admission likely in the setting of profound anemia, creatinine has returned to baseline initially and now is worsening and has basically doubled and BUN/creatinine is now 42/2.95 and further worsening to 50/3.76 -We will initiate further work-up and obtain serum and urine creatinine, urine osmolality, bladder scan, renal ultrasound and Place Foley but patient has refused Foley. -Avoid further nephrotoxic medications, contrast dyes, hypotension and renally dose medications -She has small metabolic acidosis with a CO2 of 19, anion gap of 11, chloride level 98 -IV Lasix now stopped -Now on IVF with NS at 75 mL/hr and will continue -Nephrology consulted and feels that her AKI is due to Contrast administration on 04/04/20 and Dr. Johnney Ou does not feel as if she has AIN; do not feel that she needs dialysis at this point but feels  okay may develop in the coming days and have asked the patient to consider dialysis as she is a not a long term dialysis candidate. -They are recommending continue maintenance IV fluids given poor appetite and avoiding nephrotoxins and ensuring bladder scan soon sure patient not retaining given that she continues to refuse Foley; will be decreasing gabapentin from 300 twice daily to 300 nightly next dose tomorrow -repeat CMP in the a.m.  Hyperkalemia -Mild and was 5.5 and improved after Lokelma -Her potassium is now 4.4.  Received a dose of 10 grams of Lokelma on 04/16/2020 -Continue to monitor and trend and repeat CMP in a.m.  Fever in the setting of likely UTI with Providencia and  Enterococcus -Patient spiked a temperature and became tachycardic.  She does have a leukocytosis but has a chronic leukocytosis has been following up with hematology in outpatient setting  -we will panculture and obtain blood cultures x2, urinalysis and urine culture.  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 - she was complaining of significant abdominal pain so we will obtain a CT of the abdomen pelvis today with contrast and repeat since he has a new fever -CT scan findings as below -Have notified GI and she could have SBP but will have GI review and make further recommendations -She has been afebrile today but continues to have leukocytosis and this is elevated again.  She had been afebrile and did not have a leukocytosis throughout the entire hospitalization till yesterday -BC is worsening and went from 14.7 and is now 15.5 and is now 14.4 -Chest x-ray done and showed "Right lower lobe atelectasis or infiltrate. Cardiomegaly." -Blood cultures x2 showed NGTD at 2 days -Continue monitor and trend temperature curve and leukocytosis  Paroxysmal A. Fib -in sinus now, held her Xarelto and this is now been resumed,  -her diltiazem was held on admission due to hypotension but since her blood pressure was  creeping back up, added back diltiazem in addition to her home metoprolol -GI recommending resuming Xarelto 04/03/20 and this has been done  Hyponatremia -She was 137 on 03/04/2020 and had trended up to 140 but then started trending down 04/04/2020 it was 132 and has now trended down to 128 -She appears euvolemic but nephrology questioning SIADH given that she nearly anuric now. -They are recommending checking a BMP and if it is downtrending they are recommending DCI V fluids but they are recommending continue IV fluids at this time given that she is anuric and not volume overloaded -Continue Monitor and trend repeat CMP in a.m.  Acute on Chronic Diastolic CHF -Appeared fluid overloaded with lower extremity edema but is now euvolemic  -BNP is elevated 330,  -Has pleural effusion on chest x-ray.   -She is on room air, fluid status looks a little bit better.  -She is intermittently diuresed while she  is hospitalized but will start IV fluid hydration given her AKI on CKD stage III -Patient is + 3.219 liters still; weight has been fluctuating and down 3 lbs -Continue to monitor for signs and symptoms of volume overload and repeat chest x-ray intermittently   Leukocytosis -at her baseline, seen by hematology as an outpatient regularly -WBC worsened during her hospitalization and went from 7.9 and trended all the way up to 14.8; yesterday is 14.7 but she did spike a temperature and this is likely infectious as WBC is only improved to 14.1 and has now worsened to 15.5 and is now 14.4 -She was pancultured and will treat empirically for aspiration pneumonia but likley has UTI given Lower Abdominal Pain and  -She will need to follow-up with hematology in the outpatient setting as she has had a relatively elevated leukocytosis since 2014 -Urine culture sensitivities to IV Zosyn  Hypothyroidism -continue Synthroid.  TSH normal  DM2 -A1c was 5.6,  -placed on sliding scale -CBGs ranging from  127-143  Sacral Ulcer, poA Pressure Injury 04/02/2020 Sacrum Medial Stage III -  Full thickness tissue loss. Subcutaneous fat may be visible but bone, tendon or muscle are NOT exposed. (Active)  04/07/2020 1149  Location: Sacrum  Location Orientation: Medial  Staging: Stage III -  Full thickness tissue loss. Subcutaneous fat may be visible but bone, tendon or muscle are NOT exposed.  Wound Description (Comments):   Present on Admission: Yes    DVT prophylaxis: SCDs and now have resumed her home rivaroxaban Code Status: DO NOT RESUSCITATE Family Communication: No family present at Bedside but was able to finally get in touch with Daughter [name] and updated via the Telephone Disposition Plan: Needs To be stable from an infectious standpoint prior to discharging back to SNF and cleared by GI in case she needs another flex sigmoidoscopy Status is: Inpatient  Remains inpatient appropriate because:Unsafe d/c plan, IV treatments appropriate due to intensity of illness or inability to take PO and Inpatient level of care appropriate due to severity of illness   Dispo: The patient is from: SNF              Anticipated d/c is to: SNF              Anticipated d/c date is: 1-2 days              Patient currently is not medically stable to d/c.  Consultants:   Gastroenterology; Re-consulted    Procedures:  COLONOSCOPY Findings:      Skin tags were found on perianal exam.      A large amount of solid stool was found in the rectum, in the       recto-sigmoid colon, in the sigmoid colon and in the descending colon,       interfering with visualization.      Three sessile polyps were found in the cecum. The polyps were 2 to 5 mm       in size. These polyps were removed with a hot snare. Resection and       retrieval were complete.      Six sessile polyps were found in the ascending colon. The polyps were 3       to 8 mm in size. These polyps were removed with a hot snare. Resection       and  retrieval were complete.      Three sessile polyps were found in the transverse colon. The polyps were  5 to 9 mm in size. These polyps were removed with a hot snare. Resection       and retrieval were complete.      No additional abnormalities were found on retroflexion except for large       amount of stool. Impression:               - Preparation of the colon was inadequate.                           - Perianal skin tags found on perianal exam.                           - Stool in the rectum, in the recto-sigmoid colon,                            in the sigmoid colon and in the descending colon.                           - Three 2 to 5 mm polyps in the cecum, removed with                            a hot snare. Resected and retrieved.                           - Six 3 to 8 mm polyps in the ascending colon,                            removed with a hot snare. Resected and retrieved.                           - Three 5 to 9 mm polyps in the transverse colon,                            removed with a hot snare. Resected and retrieved. Moderate Sedation:      Moderate (conscious) sedation was personally administered by an       anesthesia professional. The following parameters were monitored: oxygen       saturation, heart rate, blood pressure, and response to care. Recommendation:           - Return patient to hospital ward for ongoing care.                           - Soft diet.                           - Continue present medications.                           - Await pathology results.                           - Repeat colonoscopy in 6 months because the bowel  preparation was suboptimal.                           - Return to GI office in 2 months.  EGD Findings:      The Z-line was regular and was found 38 cm from the incisors.      The exam of the esophagus was otherwise normal.      Normal mucosa was found in the entire examined stomach. Biopsies  were       taken with a cold forceps for histology.      The cardia and gastric fundus were normal on retroflexion.      The duodenal bulb, first portion of the duodenum and second portion of       the duodenum were normal. Impression:               - Z-line regular, 38 cm from the incisors.                           - Normal mucosa was found in the entire stomach.                            Biopsied.                           - Normal duodenal bulb, first portion of the                            duodenum and second portion of the duodenum.  Antimicrobials:  Anti-infectives (From admission, onward)   Start     Dose/Rate Route Frequency Ordered Stop   04/06/20 1430  piperacillin-tazobactam (ZOSYN) IVPB 2.25 g        2.25 g 100 mL/hr over 30 Minutes Intravenous Every 6 hours 04/06/20 1320     04/05/20 1930  meropenem (MERREM) 1 g in sodium chloride 0.9 % 100 mL IVPB  Status:  Discontinued        1 g 200 mL/hr over 30 Minutes Intravenous Every 12 hours 04/05/20 1832 04/06/20 1320   04/04/20 1000  Ampicillin-Sulbactam (UNASYN) 3 g in sodium chloride 0.9 % 100 mL IVPB  Status:  Discontinued        3 g 200 mL/hr over 30 Minutes Intravenous Every 12 hours 04/04/20 0851 04/05/20 1829   03/30/20 1400  rifaximin (XIFAXAN) tablet 550 mg        550 mg Oral 2 times daily 03/30/20 1136          Subjective: Seen and examined at bedside and again continues to have abdominal pain and feels tired and fatigue and does not have very good appetite.  Is not urinating very much and continues to refuse a Foley catheter.  No chest pain, lightheadedness.  No other concerns or complaints at this time but states that she cannot pee as much.  Objective: Vitals:   04/06/20 2150 04/07/20 0430 04/07/20 0500 04/07/20 1253  BP:  126/70  134/62  Pulse: 74 63  97  Resp:  18  15  Temp:  98.4 F (36.9 C)  98.2 F (36.8 C)  TempSrc:  Oral  Oral  SpO2:  98%  97%  Weight:   63.2 kg   Height:         Intake/Output Summary (  Last 24 hours) at 04/07/2020 1702 Last data filed at 04/07/2020 0254 Gross per 24 hour  Intake 325.02 ml  Output --  Net 325.02 ml   Filed Weights   04/24/2020 0534 04/04/20 0500 04/07/20 0500  Weight: 61 kg 59.4 kg 63.2 kg   Examination: Physical Exam:  Constitutional: WN/WD AAF in NAD and appears calm but does appear uncomfortable complaining of lower abdominal Pain Eyes: Lids and conjunctivae normal, sclerae anicteric  ENMT: External Ears, Nose appear normal. Grossly normal hearing.  Neck: Appears normal, supple, no cervical masses, normal ROM, no appreciable thyromegaly; no jVD Respiratory: Diminished to auscultation bilaterally, no wheezing, rales, rhonchi or crackles. Normal respiratory effort and patient is not tachypenic. No accessory muscle use. Unlabored breathing  Cardiovascular: RRR, no murmurs / rubs / gallops. S1 and S2 auscultated. 1+ Edema Abdomen: Soft, Tender to palpate, Distended.  Bowel sounds positive.  GU: Deferred. Musculoskeletal: No clubbing / cyanosis of digits/nails. Normal strength and muscle tone.  Skin: No rashes, lesions, ulcers on a limited skin evaluation. No induration; Warm and dry.  Neurologic: CN 2-12 grossly intact with no focal deficits. Romberg sign and cerebellar reflexes not assessed.  Psychiatric: Normal judgment and insight. Alert and oriented x 3. Anxious mood and appropriate affect.   Data Reviewed: I have personally reviewed following labs and imaging studies  CBC: Recent Labs  Lab 04/03/20 0522 04/03/20 1642 04/04/20 0431 04/04/20 0431 04/04/20 1651 04/05/20 0542 04/06/20 0532 04/07/20 0450 04/07/20 1537  WBC 14.8*  --  14.7*  --   --  14.1* 15.5* 14.4*  --   NEUTROABS 10.0*  --  9.5*  --   --  10.2* 11.4* 10.0*  --   HGB 9.7*   < > 9.0*   < > 9.1* 8.6* 8.4* 8.0* 9.2*  HCT 31.0*   < > 28.6*   < > 29.2* 27.8* 26.4* 25.8* 29.0*  MCV 80.9  --  80.8  --   --  82.7 80.2 80.4  --   PLT 326  --  264  --    --  244 240 251  --    < > = values in this interval not displayed.   Basic Metabolic Panel: Recent Labs  Lab 04/03/20 0522 04/03/20 0522 04/04/20 0431 04/05/20 0542 04/06/20 0532 04/07/20 0450 04/07/20 1537  NA 138   < > 136 132* 133* 126* 128*  K 4.4   < > 4.3 4.0 4.3 4.1 4.4  CL 109   < > 104 102 102 98 98  CO2 20*   < > 19* 19* 20* 18* 19*  GLUCOSE 149*   < > 141* 164* 116* 98 101*  BUN 45*   < > 35* 34* 42* 43* 50*  CREATININE 1.61*   < > 1.41* 1.87* 2.95* 3.62* 3.76*  CALCIUM 8.7*   < > 8.3* 8.0* 7.7* 7.5* 7.7*  MG 1.9  --  1.6* 2.3 2.2 2.0  --   PHOS 3.5  --  3.4 4.1 5.1* 5.4*  --    < > = values in this interval not displayed.   GFR: Estimated Creatinine Clearance: 11 mL/min (A) (by C-G formula based on SCr of 3.76 mg/dL (H)). Liver Function Tests: Recent Labs  Lab 04/03/20 0522 04/04/20 0431 04/05/20 0542 04/06/20 0532 04/07/20 0450  AST 19 18 17 28  36  ALT 13 11 11 16 21   ALKPHOS 103 80 78 97 107  BILITOT 0.8 0.7 0.6 0.9 0.7  PROT 6.9 6.1* 6.1*  5.7* 5.7*  ALBUMIN 3.1* 2.8* 2.7* 2.5* 2.4*   No results for input(s): LIPASE, AMYLASE in the last 168 hours. No results for input(s): AMMONIA in the last 168 hours. Coagulation Profile: No results for input(s): INR, PROTIME in the last 168 hours. Cardiac Enzymes: No results for input(s): CKTOTAL, CKMB, CKMBINDEX, TROPONINI in the last 168 hours. BNP (last 3 results) No results for input(s): PROBNP in the last 8760 hours. HbA1C: No results for input(s): HGBA1C in the last 72 hours. CBG: Recent Labs  Lab 04/06/20 1622 04/06/20 2127 04/07/20 0738 04/07/20 1249 04/07/20 1656  GLUCAP 136* 130* 103* 99 100*   Lipid Profile: No results for input(s): CHOL, HDL, LDLCALC, TRIG, CHOLHDL, LDLDIRECT in the last 72 hours. Thyroid Function Tests: No results for input(s): TSH, T4TOTAL, FREET4, T3FREE, THYROIDAB in the last 72 hours. Anemia Panel: No results for input(s): VITAMINB12, FOLATE, FERRITIN, TIBC, IRON,  RETICCTPCT in the last 72 hours. Sepsis Labs: Recent Labs  Lab 04/04/20 0431 04/04/20 0625 04/05/20 0542  PROCALCITON 0.78 0.81 0.97  LATICACIDVEN  --  0.8  --     Recent Results (from the past 240 hour(s))  Respiratory Panel by RT PCR (Flu A&B, Covid) - Nasopharyngeal Swab     Status: None   Collection Time: 03/23/2020  2:57 PM   Specimen: Nasopharyngeal Swab  Result Value Ref Range Status   SARS Coronavirus 2 by RT PCR NEGATIVE NEGATIVE Final    Comment: (NOTE) SARS-CoV-2 target nucleic acids are NOT DETECTED.  The SARS-CoV-2 RNA is generally detectable in upper respiratoy specimens during the acute phase of infection. The lowest concentration of SARS-CoV-2 viral copies this assay can detect is 131 copies/mL. A negative result does not preclude SARS-Cov-2 infection and should not be used as the sole basis for treatment or other patient management decisions. A negative result may occur with  improper specimen collection/handling, submission of specimen other than nasopharyngeal swab, presence of viral mutation(s) within the areas targeted by this assay, and inadequate number of viral copies (<131 copies/mL). A negative result must be combined with clinical observations, patient history, and epidemiological information. The expected result is Negative.  Fact Sheet for Patients:  PinkCheek.be  Fact Sheet for Healthcare Providers:  GravelBags.it  This test is no t yet approved or cleared by the [place] FDA and  has been authorized for detection and/or diagnosis of SARS-CoV-2 by FDA under an Emergency Use Authorization (EUA). This EUA will remain  in effect (meaning this test can be used) for the duration of the COVID-19 declaration under Section 564(b)(1) of the Act, 21 U.S.C. section 360bbb-3(b)(1), unless the authorization is terminated or revoked sooner.     Influenza A by PCR NEGATIVE NEGATIVE Final    Influenza B by PCR NEGATIVE NEGATIVE Final    Comment: (NOTE) The Xpert Xpress SARS-CoV-2/FLU/RSV assay is intended as an aid in  the diagnosis of influenza from Nasopharyngeal swab specimens and  should not be used as a sole basis for treatment. Nasal washings and  aspirates are unacceptable for Xpert Xpress SARS-CoV-2/FLU/RSV  testing.  Fact Sheet for Patients: PinkCheek.be  Fact Sheet for Healthcare Providers: GravelBags.it  This test is not yet approved or cleared by the [place] FDA and  has been authorized for detection and/or diagnosis of SARS-CoV-2 by  FDA under an Emergency Use Authorization (EUA). This EUA will remain  in effect (meaning this test can be used) for the duration of the  Covid-19 declaration under Section 564(b)(1) of the Act, 21  U.S.C. section 360bbb-3(b)(1), unless the authorization is  terminated or revoked. Performed at [facility], [place] [address] [place], [place] [address]   MRSA PCR Screening     Status: Abnormal   Collection Time: 04/27/2020  7:44 AM   Specimen: Nasopharyngeal  Result Value Ref Range Status   MRSA by PCR POSITIVE (A) NEGATIVE Final    Comment:        The GeneXpert MRSA Assay (FDA approved for NASAL specimens only), is one component of a comprehensive MRSA colonization surveillance program. It is not intended to diagnose MRSA infection nor to guide or monitor treatment for MRSA infections. RESULT CALLED TO, READ BACK BY AND VERIFIED WITH: DARK,D. RN @1111  ON 11.01.2021 BY COHEN,K Performed at [facility], [place] [address] [place], [place] [address]   Culture, blood (Routine X 2) w Reflex to ID Panel     Status: None (Preliminary result)   Collection Time: 04/04/20  6:25 AM   Specimen: BLOOD  Result Value Ref Range Status   Specimen Description   Final    BLOOD RIGHT ANTECUBITAL Performed at [facility] [address] [place], [place] [address]    Special Requests   Final    BOTTLES DRAWN AEROBIC ONLY Blood Culture adequate volume Performed at [facility] [address], [place], [place] [address]    Culture   Final    NO GROWTH 3 DAYS Performed at [facility], [place] [address] [place], [place] [address]    Report Status PENDING  Incomplete  Culture, blood (Routine X 2) w Reflex to ID Panel     Status: None (Preliminary result)   Collection Time: 04/04/20  6:25 AM   Specimen: BLOOD RIGHT HAND  Result Value Ref Range Status   Specimen Description   Final    BLOOD RIGHT HAND Performed at [facility] [address], [place], [place] [address]    Special Requests   Final    BOTTLES DRAWN AEROBIC AND ANAEROBIC Blood Culture adequate volume Performed at [facility] [address], [place], [place] [address]    Culture   Final    NO GROWTH 3 DAYS Performed at [facility], [place] [address] [place], [place] [address]    Report Status PENDING  Incomplete  Culture, Urine     Status: Abnormal   Collection Time: 04/04/20  3:08 PM   Specimen: Urine, Random  Result Value Ref Range Status   Specimen Description   Final    URINE, RANDOM Performed at [facility] [address] [place], [place] [address]    Special Requests   Final    NONE Performed at [facility], [place] [address] [place], [place] [address]    Culture (A)  Final    70,000 COLONIES/mL PROVIDENCIA STUARTII >=100,000 COLONIES/mL VANCOMYCIN RESISTANT ENTEROCOCCUS    Report Status 04/06/2020 FINAL  Final   Organism ID, Bacteria PROVIDENCIA STUARTII (A)  Final   Organism ID, Bacteria VANCOMYCIN RESISTANT ENTEROCOCCUS (A)  Final      Susceptibility   Providencia stuartii - MIC*    AMPICILLIN >=32 RESISTANT Resistant     CEFAZOLIN >=64 RESISTANT Resistant     CEFEPIME 0.25 SENSITIVE Sensitive      CEFTRIAXONE >=64 RESISTANT Resistant     CIPROFLOXACIN >=4 RESISTANT Resistant     GENTAMICIN RESISTANT Resistant     IMIPENEM 2 SENSITIVE Sensitive  NITROFURANTOIN 128 RESISTANT Resistant     TRIMETH/SULFA 160 RESISTANT Resistant     AMPICILLIN/SULBACTAM >=32 RESISTANT Resistant     PIP/TAZO <=4 SENSITIVE Sensitive     * 70,000 COLONIES/mL PROVIDENCIA STUARTII   Vancomycin resistant enterococcus - MIC*    AMPICILLIN <=2 SENSITIVE Sensitive     NITROFURANTOIN <=16 SENSITIVE Sensitive     VANCOMYCIN >=32 RESISTANT Resistant     LINEZOLID 2 SENSITIVE Sensitive     * >=100,000 COLONIES/mL VANCOMYCIN RESISTANT ENTEROCOCCUS     RN Pressure Injury Documentation: Pressure Injury 04/05/2020 Sacrum Medial Stage III -  Full thickness tissue loss. Subcutaneous fat may be visible but bone, tendon or muscle are NOT exposed. (Active)  04/18/2020 1149  Location: Sacrum  Location Orientation: Medial  Staging: Stage III -  Full thickness tissue loss. Subcutaneous fat may be visible but bone, tendon or muscle are NOT exposed.  Wound Description (Comments):   Present on Admission: Yes     Estimated body mass index is 25.9 kg/m as calculated from the following:   Height as of this encounter: 5' 1.5" (1.562 m).   Weight as of this encounter: 63.2 kg.  Malnutrition Type:      Malnutrition Characteristics:      Nutrition Interventions:    Radiology Studies: US RENAL  Result Date: 04/06/2020 CLINICAL DATA:  Acute kidney injury EXAM: RENAL / URINARY TRACT ULTRASOUND COMPLETE COMPARISON:  CT 04/04/2020, MRI 04/01/2020 FINDINGS: Right Kidney: Renal measurements: 8.3 x 5.3 x 5.1 cm = volume: 117 mL. Diffusely increased renal cortical echogenicity. Simple appearing cyst present in the upper pole measuring up to 1.5 cm. No concerning renal mass, calculus or hydronephrosis. Additional smaller cysts seen on comparison MRI are less well visualized. Left Kidney: Renal measurements: 10.0 x 5.4 x 5.6 cm =  volume: 159 mL. Diffusely increased renal cortical echogenicity. Several small cysts present throughout the left kidney, largest measuring up to 1.2 cm in the lower pole. No concerning renal mass, calculus or hydronephrosis. Bladder: Appears normal for degree of bladder distention. Other: Incidental note made of trace ascites and bilateral pleural effusions. A calcified gallstone is also noted within the gallbladder lumen with posterior shadowing. IMPRESSION: 1. Increased renal cortical echogenicity bilaterally, compatible with medical renal disease. 2. Few small simple appearing cysts in both kidneys. Several smaller cysts seen on comparison MR imaging are not as well visualized. 3. Trace ascites and bilateral pleural effusions. 4. Cholelithiasis. Electronically Signed   By: Lovena Le M.D.   On: 04/06/2020 19:10   Scheduled Meds:  diltiazem  120 mg Oral Daily   [START ON 04/08/2020] gabapentin  300 mg Oral QHS   guaiFENesin  1,200 mg Oral BID   heparin injection (subcutaneous)  5,000 Units Subcutaneous Q8H   insulin aspart  0-9 Units Subcutaneous TID WC   lactulose  10 g Oral BID   levothyroxine  88 mcg Oral Q0600   metoprolol tartrate  25 mg Oral BID   pantoprazole  40 mg Oral Daily   polyethylene glycol  17 g Oral Q8H   rifaximin  550 mg Oral BID   sodium bicarbonate  650 mg Oral TID   sodium chloride flush  3 mL Intravenous Q12H   sodium phosphate  2 enema Rectal Once   Continuous Infusions:  sodium chloride Stopped (04/01/20 0906)   sodium chloride 75 mL/hr at 04/07/20 0629   piperacillin-tazobactam 2.25 g (04/07/20 1514)    LOS: 9 days   Kerney Elbe, DO [facility] PAGER  is on AMION  If 7PM-7AM, please contact night-coverage [email or web address]

## 2020-04-07 NOTE — Progress Notes (Signed)
   04/06/20 0320  Urine Characteristics  Urinary Interventions Bladder scan  Bladder Scan Volume (mL) 168 mL  Pt without complaints of suprapubic pain.  No fullness noted in bladder region.Pt is refusing foley catheter at present.  Given information and reasons for foley replacement requirements.  Pt states no other pain at this time.  No pain noted in flank areas.  IVF continues to infuse.  Pt informed to notify if she has any discomfort.

## 2020-04-07 NOTE — Progress Notes (Signed)
[facility] Progress Note  Sarah Colon 74 y.o. 10/14/1945   Subjective: Complaining of abdominal pain. Brown stools overnight.  Objective: Vital signs: Vitals:   04/06/20 2150 04/07/20 0430  BP:  126/70  Pulse: 74 63  Resp:  18  Temp:  98.4 F (36.9 C)  SpO2:  98%    Physical Exam: Gen: lethargic, elderly, frail, no acute distress  HEENT: anicteric sclera CV: RRR Chest: CTA B Abd: diffuse tenderness with guarding, soft, nondistended, +BS Ext: no edema  Lab Results: Recent Labs    04/06/20 0532 04/07/20 0450  NA 133* 126*  K 4.3 4.1  CL 102 98  CO2 20* 18*  GLUCOSE 116* 98  BUN 42* 43*  CREATININE 2.95* 3.62*  CALCIUM 7.7* 7.5*  MG 2.2 2.0  PHOS 5.1* 5.4*   Recent Labs    04/06/20 0532 04/07/20 0450  AST 28 36  ALT 16 21  ALKPHOS 97 107  BILITOT 0.9 0.7  PROT 5.7* 5.7*  ALBUMIN 2.5* 2.4*   Recent Labs    04/06/20 0532 04/07/20 0450  WBC 15.5* 14.4*  NEUTROABS 11.4* 10.0*  HGB 8.4* 8.0*  HCT 26.4* 25.8*  MCV 80.2 80.4  PLT 240 251      Assessment/Plan: Anemia without any overt bleeding. Abdominal pain likely multifactorial but fecal impaction definitely played a role. Continue supportive care. Will manage conservatively and hold off on flex sig since recent colonoscopy and suspect CT findings due to fecal impaction. Will sign off. Call if questions.   Sarah Colon 04/07/2020, 10:26 AM  Questions please call [phone] ID: Sarah Colon, female   DOB: May 01, 1946, 74 y.o.   MRN: [id number]

## 2020-04-07 NOTE — Care Management Important Message (Signed)
Important Message  Patient Details IM Letter given to the Patient Name: Sarah Colon MRN: [id number] Date of Birth: [birth year]   Medicare Important Message Given:  Yes     Kerin Salen 04/07/2020, 11:41 AM
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
## 2020-04-07 NOTE — TOC Progression Note (Signed)
Transition of Care [facility]) - Progression Note    Patient Details  Name: Sarah Colon MRN: [id number] Date of Birth: 05/30/1946  Transition [facility] CM/SW Contact  Joaquin Courts, RN Phone Number: 04/07/2020, 12:37 PM  Clinical Narrative:  Patient is a long term resident from [facility]  Plan for patient to return to facility, possibly in a couple of days.  Facility notified of this anticipated DC timeframe, updated FL2 faxed to facility.  Facility will need DC summary day of discharge as well as an updated Covid test.       Expected Discharge Plan: [place] Barriers to Discharge: Continued Medical Work up  Expected Discharge Plan and Services Expected Discharge Plan: [place]   Discharge Planning Services: CM Consult Post Acute Care Choice: Resumption of Svcs/PTA Provider Living arrangements for the past 2 months: [place]                 DME Arranged: N/A         HH Arranged: NA           Social Determinants of Health (SDOH) Interventions    Readmission Risk Interventions No flowsheet data found.

## 2020-04-07 NOTE — Progress Notes (Signed)
   04/07/20 6116  Provider Notification  Provider Name/Title [place]  Date Provider Notified 04/07/20  Time Provider Notified [phone]  Notification Type Page  Notification Reason Other (Comment) (No urine output-bladder scan 168)  Response Other (Comment) (awaiting md reply)  Explained to pt need for foley and she does not desire it at this time.  Bladderscan as noted. Pt expresses no discomfort currently.

## 2020-04-07 NOTE — Progress Notes (Signed)
Patient refusing PM medications, "why do you all keep bothering me?" Denies feeling to urinate, < 50 ml in suction container.

## 2020-04-08 DIAGNOSIS — N390 Urinary tract infection, site not specified: Secondary | ICD-10-CM

## 2020-04-08 DIAGNOSIS — G9341 Metabolic encephalopathy: Secondary | ICD-10-CM

## 2020-04-08 DIAGNOSIS — K7469 Other cirrhosis of liver: Secondary | ICD-10-CM

## 2020-04-08 DIAGNOSIS — D649 Anemia, unspecified: Secondary | ICD-10-CM | POA: Diagnosis not present

## 2020-04-08 DIAGNOSIS — I509 Heart failure, unspecified: Secondary | ICD-10-CM

## 2020-04-08 DIAGNOSIS — E039 Hypothyroidism, unspecified: Secondary | ICD-10-CM | POA: Diagnosis not present

## 2020-04-08 DIAGNOSIS — N179 Acute kidney failure, unspecified: Secondary | ICD-10-CM | POA: Diagnosis not present

## 2020-04-08 DIAGNOSIS — R1032 Left lower quadrant pain: Secondary | ICD-10-CM

## 2020-04-08 DIAGNOSIS — I5033 Acute on chronic diastolic (congestive) heart failure: Secondary | ICD-10-CM | POA: Diagnosis not present

## 2020-04-08 LAB — COMPREHENSIVE METABOLIC PANEL
ALT: 26 U/L (ref 0–44)
AST: 44 U/L — ABNORMAL HIGH (ref 15–41)
Albumin: 2.4 g/dL — ABNORMAL LOW (ref 3.5–5.0)
Alkaline Phosphatase: 122 U/L (ref 38–126)
Anion gap: 12 (ref 5–15)
BUN: 51 mg/dL — ABNORMAL HIGH (ref 8–23)
CO2: 16 mmol/L — ABNORMAL LOW (ref 22–32)
Calcium: 8 mg/dL — ABNORMAL LOW (ref 8.9–10.3)
Chloride: 100 mmol/L (ref 98–111)
Creatinine, Ser: 4.28 mg/dL — ABNORMAL HIGH (ref 0.44–1.00)
GFR, Estimated: 10 mL/min — ABNORMAL LOW (ref >60)
Glucose, Bld: 84 mg/dL (ref 70–99)
Potassium: 4.3 mmol/L (ref 3.5–5.1)
Sodium: 128 mmol/L — ABNORMAL LOW (ref 135–145)
Total Bilirubin: 1 mg/dL (ref 0.3–1.2)
Total Protein: 5.9 g/dL — ABNORMAL LOW (ref 6.5–8.1)

## 2020-04-08 LAB — CBC WITH DIFFERENTIAL/PLATELET
Abs Immature Granulocytes: 0.05 10*3/uL (ref 0.00–0.07)
Basophils Absolute: 0.1 10*3/uL (ref 0.0–0.1)
Basophils Relative: 1 %
Eosinophils Absolute: 0.6 10*3/uL — ABNORMAL HIGH (ref 0.0–0.5)
Eosinophils Relative: 5 %
HCT: 29.6 % — ABNORMAL LOW (ref 36.0–46.0)
Hemoglobin: 9.2 g/dL — ABNORMAL LOW (ref 12.0–15.0)
Immature Granulocytes: 1 %
Lymphocytes Relative: 11 %
Lymphs Abs: 1.2 10*3/uL (ref 0.7–4.0)
MCH: 25.1 pg — ABNORMAL LOW (ref 26.0–34.0)
MCHC: 31.1 g/dL (ref 30.0–36.0)
MCV: 80.9 fL (ref 80.0–100.0)
Monocytes Absolute: 1.1 10*3/uL — ABNORMAL HIGH (ref 0.1–1.0)
Monocytes Relative: 10 %
Neutro Abs: 8.1 10*3/uL — ABNORMAL HIGH (ref 1.7–7.7)
Neutrophils Relative %: 72 %
Platelets: 322 10*3/uL (ref 150–400)
RBC: 3.66 MIL/uL — ABNORMAL LOW (ref 3.87–5.11)
RDW: 27.9 % — ABNORMAL HIGH (ref 11.5–15.5)
WBC: 11 10*3/uL — ABNORMAL HIGH (ref 4.0–10.5)
nRBC: 0 % (ref 0.0–0.2)

## 2020-04-08 LAB — RENAL FUNCTION PANEL
Albumin: 2.3 g/dL — ABNORMAL LOW (ref 3.5–5.0)
Anion gap: 11 (ref 5–15)
BUN: 50 mg/dL — ABNORMAL HIGH (ref 8–23)
CO2: 17 mmol/L — ABNORMAL LOW (ref 22–32)
Calcium: 8 mg/dL — ABNORMAL LOW (ref 8.9–10.3)
Chloride: 100 mmol/L (ref 98–111)
Creatinine, Ser: 4.07 mg/dL — ABNORMAL HIGH (ref 0.44–1.00)
GFR, Estimated: 11 mL/min — ABNORMAL LOW (ref >60)
Glucose, Bld: 84 mg/dL (ref 70–99)
Phosphorus: 7.1 mg/dL — ABNORMAL HIGH (ref 2.5–4.6)
Potassium: 4.3 mmol/L (ref 3.5–5.1)
Sodium: 128 mmol/L — ABNORMAL LOW (ref 135–145)

## 2020-04-08 LAB — GLUCOSE, CAPILLARY
Glucose-Capillary: 89 mg/dL (ref 70–99)
Glucose-Capillary: 81 mg/dL (ref 70–99)
Glucose-Capillary: 106 mg/dL — ABNORMAL HIGH (ref 70–99)
Glucose-Capillary: 114 mg/dL — ABNORMAL HIGH (ref 70–99)

## 2020-04-08 LAB — MAGNESIUM: Magnesium: 2.1 mg/dL (ref 1.7–2.4)

## 2020-04-08 LAB — PHOSPHORUS: Phosphorus: 6.9 mg/dL — ABNORMAL HIGH (ref 2.5–4.6)

## 2020-04-08 NOTE — Progress Notes (Signed)
Pt has not voided during day shift today. Bladder scan at 1750 showed 251 cc's urine in bladder. Pt denies urge to void. Discussed in and out cath and foley catheter with patient. Pt stated "absolutely not". Pt encouraged to drink more fluids. Verbalized understanding. Dr. Grandville Silos sent secure message and made aware.

## 2020-04-08 NOTE — Progress Notes (Signed)
PROGRESS NOTE    Sarah Colon  MOQ:[id number] DOB: 1946/01/06 DOA: 03/09/2020 PCP: Patient, No Pcp Per    Chief Complaint  Patient presents with  . Cough  . Shortness of Breath    Brief Narrative:  The patient is a 74 year old African-American female with a past medical history significant for but not limited to diabetes mellitus type 2, diastolic CHF, hypertension, paroxysmal atrial fibrillation, chronic kidney disease stage IV with a baseline creatinine of 1.5-1.8 as well as other comorbidities who presented to the ED with shortness of breath, chest pain, fatigue over the last week.  She also reported a poor appetite.  She is found to have a hemoglobin of 3 on admission.  There are no reports of blood in her stool or dark or tarry stools.  Fecal occult test in the ED was positive.  GI was consulted and she was admitted to the hospitalist service.  She normally lives in SNF is nonambulatory and in the wheelchair.  She underwent EGD and colonoscopy without evidence of any bleeding.  She had multiple polyp removal from her colonoscopy today.  Her prep was poor.  GI recommending resuming Xarelto 04/03/20 and she received another dose of diuresis yesterday given her volume overload.  Her Xarelto is now resumed today but this evening on 04/04/2019 when she spiked a temperature and she does have a leukocytosis which is chronic but we will panculture to rule out infection.  She is complaining of some abdominal pain so we will get a CT of the abdomen pelvis.  Chest x-ray shows possible pneumonia so she was started on Unasyn and SLP was consulted given the location being the Right Lung Base.  **After further workup it appears the patient has a UTI with Providencia and Entercoccus Faecalis. We will escalate her antibiotics to IV meropenem and hold off of Vancomycin for now. Repeat CT scan showed persistent mild perirectal and presacral fat stranding of unclear etiology and presacral rectal findings  associate with cortical erosions, sclerosis and thinning of the S1 vertebral body. As well as the urinary bladder that was thickened circumferentially. Hemoglobin has dropped a little bit GI feels that the CT findings are in the setting of fecal impaction and due to stool inflammation from the fecal impaction. They feel that her anemia worsens and she will need a flex sig with enemas for prep prior to discharge to reevaluate the rectum after resolution of the fecal impaction. We will escalate antibiotics given her UTI.  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  Her renal function continues to worsen and we are doing further work-up will obtain a bladder scan, renal ultrasound and started the patient back on gentle IV fluids with normal saline at 25 mils per hour.  We are to place a Foley catheter for strict I's and O's however patient refused this.  We will continue monitor and will notify nephrology about her worsening renal function.  Nephrology feels that Contrast from last CT scan caused her AKI and GI has signed off and feel as if her Abdominal Pain is multifactorial. They are going to hold off on Flex Sigmoidoscopy.     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  Currently on heparin.  FOBT which was done was positive.  Patient status post  transfusion of 3 units packed red blood cells.  Hemoglobin on admission was 3.1 and currently at 9.2.  Patient with no overt bleeding.  Patient seen in consultation by GI underwent upper endoscopy 04/09/2020 without any significant findings or source of GI bleed.  Patient underwent rescheduled colonoscopy 04/19/2020 with poor prep and showed 2 to 5 mm polyps in the cecum, 3 5-9MM polyps in the transverse colon which were removed.  Biopsies consistent with tubular adenoma without high-grade dysplasia.  Due to poor prep GI recommending repeat colonoscopy in 6 months.  Patient currently on a soft diet.  Could likely resume Xarelto in the next 1 to 2 days if hemoglobin remained stable and improvement with renal function.  CT abdomen and pelvis was done which showed focal rectal wall thickening, acute cellular fluid as well as circumferential thickened bladder.  Leukocytosis trending down.  GI was reconsulted and patient given a soapsuds enema.  Per GI if continued drop in hemoglobin will need a flex sigmoidoscopy.  Currently holding off on flex sigmoidoscopy.  GI has signed off.  Follow H&H.  Transfusion threshold hemoglobin <7.  2.  Left lower quadrant abdominal pain CT abdomen and pelvis done without any acute findings.  Patient noted to have had a fever with a leukocytosis.  Fever likely secondary from UTI.  CT with focal rectal wall thickening of acute cellular fluid as well as circumferentially thickened bladder.  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  On empiric IV antibiotics.  Clinical improvement.  Follow.  3.  Liver cirrhosis New diagnosis.  MRI of abdomen was degraded exam however no concerning lesions noted repeat MRI recommended which can be done in the outpatient setting.  Continue Xifaxan, lactulose.  Outpatient follow-up with GI.  4.  Acute metabolic encephalopathy Likely secondary to symptomatic profound anemia.  Ammonia levels within normal limits.  Encephalopathy resolved  and currently at baseline.  5.  Acute renal failure on chronic kidney disease stage IV Baseline creatinine approximately 2.  Patient seen in consultation by nephrology who do not feel patient has any evidence of AIN and do not recommend any change in antibiotics.  Patient could likely have developed acute insult on chronic kidney disease stage IV from contrast nephropathy from CT done 04/04/2020.  Nephrology does not feel patient is a good long-term dialysis candidate but I discussed with patient for temporary dialysis if needed.  Gentle hydration due to poor appetite per nephrology recommendations.  Gabapentin dose has been decreased to 300 mg nightly.  Follow.  6.  Providencia stuartii and VRE UTI Continue IV Zosyn.  Supportive care.  Follow.  7.  Metabolic acidosis Likely secondary to acute kidney injury.  Patient started on bicarb tablets per nephrology.  8.  Hyponatremia ??  SIADH.  Patient on 1200 cc fluid restriction per nephrology.  Continue gentle hydration.  Follow.  9.  Hyperkalemia Resolved with Lokelma.  10.  Acute on chronic diastolic CHF Patient noted during the hospitalization early on to be volume overloaded on examination with lower extremity edema.  BNP elevated at 330 however patient with chronic kidney disease.  Pleural effusions noted on chest x-ray.  Patient status post intermittent diuresis with clinical improvement.  Currently euvolemic.  Follow.  11.  Hypothyroidism Continue Synthroid.  12.  Well-controlled diabetes mellitus type 2 Hemoglobin A1c 5.6.  Sliding scale insulin.  13.  Paroxysmal atrial fibrillation Continue beta-blocker for rate control.  Xarelto currently on hold.  If hemoglobin remains stable could likely resume the next 1 to 2 days with improvement with renal function.  13. Sacral ulcer, POA Pressure Injury 04/12/2020 Sacrum Medial Stage III -  Full thickness tissue loss. Subcutaneous fat may be visible but bone, tendon or muscle are NOT exposed.  (Active)  04/01/2020 1149  Location: Sacrum  Location Orientation: Medial  Staging: Stage III -  Full thickness tissue loss. Subcutaneous fat may be visible but bone, tendon or muscle are NOT exposed.  Wound Description (Comments):   Present on Admission: Yes         DVT prophylaxis: SCDs>>> Heparin Code Status: DNR Family Communication: Updated patient.  No family at bedside. Disposition:   Status is: Inpatient    Dispo: The patient is from: SNF              Anticipated d/c is to: SNF              Anticipated d/c date is: To be determined.  When cleared by nephrology.              Patient currently with acute on chronic kidney disease stage IV, being followed by nephrology, UTI on IV antibiotics, anemia status post transfusion.  Not stable for discharge.       Consultants:   Nephrology: Dr. Johnney Ou 04/07/2020  Gastroenterology: Dr. Alessandra Bevels 03/30/2020  Procedures:   Transfuse 3 units packed red blood cells 03/11/2020--04/20/2020  Colonoscopy 04/08/2020--per Dr. Alessandra Bevels  Upper endoscopy 04/25/2020--by Dr. Alessandra Bevels  CT abdomen and pelvis 04/04/2020, 03/10/2020  CT pelvis 03/08/2020  Chest x-ray 03/28/2020, 03/30/2020, 04/03/2020  MRI liver 04/01/2020  Renal ultrasound 04/06/2020  2D echo 03/30/2020  Antimicrobials:  Anti-infectives (From admission, onward)   Start     Dose/Rate Route Frequency Ordered Stop   04/06/20 1430  piperacillin-tazobactam (ZOSYN) IVPB 2.25 g        2.25 g 100 mL/hr over 30 Minutes Intravenous Every 6 hours 04/06/20 1320     04/05/20 1930  meropenem (MERREM) 1 g in sodium chloride 0.9 % 100 mL IVPB  Status:  Discontinued        1 g 200 mL/hr over 30 Minutes Intravenous Every 12 hours 04/05/20 1832 04/06/20 1320   04/04/20 1000  Ampicillin-Sulbactam (UNASYN) 3 g in sodium chloride 0.9 % 100 mL IVPB  Status:  Discontinued        3 g 200 mL/hr over 30 Minutes Intravenous Every 12 hours 04/04/20 0851 04/05/20 1829   03/30/20 1400   rifaximin (XIFAXAN) tablet 550 mg        550 mg Oral 2 times daily 03/30/20 1136         Subjective: Patient laying in bed.  Denies any chest pain or shortness of breath.  Denies any abdominal pain.  States she is feeling better than on admission. States some improvement with fatigue.  Denies any overt bleeding.  Objective: Vitals:   04/08/20 0434 04/08/20 0439 04/08/20 1250 04/08/20 2119  BP: (!) 136/53  (!) 146/63 (!) 161/82  Pulse: (!) 105  96 99  Resp: 16  16  Temp: 98.6 F (37 C)  98.6 F (37 C) 98.7 F (37.1 C)  TempSrc: Oral  Oral Oral  SpO2: 99%  97% 97%  Weight:  66.1 kg    Height:        Intake/Output Summary (Last 24 hours) at 04/08/2020 2148 Last data filed at 04/08/2020 2100 Gross per 24 hour  Intake 3196.54 ml  Output 250 ml  Net 2946.54 ml   Filed Weights   04/04/20 0500 04/07/20 0500 04/08/20 0439  Weight: 59.4 kg 63.2 kg 66.1 kg    Examination:  General exam: NAD Respiratory system: Clear to auscultation. Respiratory effort normal. Cardiovascular system: S1 & S2 heard, RRR. No JVD, murmurs, rubs, gallops or clicks. No pedal edema. Gastrointestinal system: Abdomen is nondistended, soft and nontender. No organomegaly or masses felt. Normal bowel sounds heard. Central nervous system: Alert and oriented. No focal neurological deficits. Extremities: Symmetric 5 x 5 power. Skin: No rashes, lesions or ulcers Psychiatry: Judgement and insight appear normal. Mood & affect appropriate.     Data Reviewed: I have personally reviewed following labs and imaging studies  CBC: Recent Labs  Lab 04/04/20 0431 04/04/20 1651 04/05/20 0542 04/06/20 0532 04/07/20 0450 04/07/20 1537 04/08/20 0516  WBC 14.7*  --  14.1* 15.5* 14.4*  --  11.0*  NEUTROABS 9.5*  --  10.2* 11.4* 10.0*  --  8.1*  HGB 9.0*   < > 8.6* 8.4* 8.0* 9.2* 9.2*  HCT 28.6*   < > 27.8* 26.4* 25.8* 29.0* 29.6*  MCV 80.8  --  82.7 80.2 80.4  --  80.9  PLT 264  --  244 240 251  --  322   < >  = values in this interval not displayed.    Basic Metabolic Panel: Recent Labs  Lab 04/04/20 0431 04/04/20 0431 04/05/20 0542 04/06/20 0532 04/07/20 0450 04/07/20 1537 04/08/20 0516  NA 136   < > 132* 133* 126* 128* 128*  128*  K 4.3   < > 4.0 4.3 4.1 4.4 4.3  4.3  CL 104   < > 102 102 98 98 100  100  CO2 19*   < > 19* 20* 18* 19* 16*  17*  GLUCOSE 141*   < > 164* 116* 98 101* 84  84  BUN 35*   < > 34* 42* 43* 50* 51*  50*  CREATININE 1.41*   < > 1.87* 2.95* 3.62* 3.76* 4.28*  4.07*  CALCIUM 8.3*   < > 8.0* 7.7* 7.5* 7.7* 8.0*  8.0*  MG 1.6*  --  2.3 2.2 2.0  --  2.1  PHOS 3.4  --  4.1 5.1* 5.4*  --  6.9*  7.1*   < > = values in this interval not displayed.    GFR: Estimated Creatinine Clearance: 10.4 mL/min (A) (by C-G formula based on SCr of 4.07 mg/dL (H)).  Liver Function Tests: Recent Labs  Lab 04/04/20 0431 04/05/20 0542 04/06/20 0532 04/07/20 0450 04/08/20 0516  AST 18 17 28  36 44*  ALT 11 11 16 21 26   ALKPHOS 80 78 97 107 122  BILITOT 0.7 0.6 0.9 0.7 1.0  PROT 6.1* 6.1* 5.7* 5.7* 5.9*  ALBUMIN 2.8* 2.7* 2.5* 2.4* 2.4*  2.3*    CBG: Recent Labs  Lab 04/07/20 2221 04/08/20 0731 04/08/20 1245 04/08/20 1739 04/08/20 2116  GLUCAP 86 89 81 106* 114*     Recent Results (from the past 240 hour(s))  MRSA PCR Screening     Status: Abnormal  Collection Time: 04/10/2020  7:44 AM   Specimen: Nasopharyngeal  Result Value Ref Range Status   MRSA by PCR POSITIVE (A) NEGATIVE Final    Comment:        The GeneXpert MRSA Assay (FDA approved for NASAL specimens only), is one component of a comprehensive MRSA colonization surveillance program. It is not intended to diagnose MRSA infection nor to guide or monitor treatment for MRSA infections. RESULT CALLED TO, READ BACK BY AND VERIFIED WITH: DARK,D. RN @1111  ON 11.01.2021 BY COHEN,K Performed at [facility], [place] [address], [place], [place] [address]   Culture, blood  (Routine X 2) w Reflex to ID Panel     Status: None (Preliminary result)   Collection Time: 04/04/20  6:25 AM   Specimen: BLOOD  Result Value Ref Range Status   Specimen Description   Final    BLOOD RIGHT ANTECUBITAL Performed at [facility] [address] [place], [place] [address]    Special Requests   Final    BOTTLES DRAWN AEROBIC ONLY Blood Culture adequate volume Performed at [place] [address] [place], [place] [address]    Culture   Final    NO GROWTH 4 DAYS Performed at [facility], [place] [address], [place], [place] [address]    Report Status PENDING  Incomplete  Culture, blood (Routine X 2) w Reflex to ID Panel     Status: None (Preliminary result)   Collection Time: 04/04/20  6:25 AM   Specimen: BLOOD RIGHT HAND  Result Value Ref Range Status   Specimen Description   Final    BLOOD RIGHT HAND Performed at [facility] [address] [place], [place] [address]    Special Requests   Final    BOTTLES DRAWN AEROBIC AND ANAEROBIC Blood Culture adequate volume Performed at [place] [address], [place], [place] [address]    Culture   Final    NO GROWTH 4 DAYS Performed at [facility], [place] [address], [place], [place] [address]    Report Status PENDING  Incomplete  Culture, Urine     Status: Abnormal   Collection Time: 04/04/20  3:08 PM   Specimen: Urine, Random  Result Value Ref Range Status   Specimen Description   Final    URINE, RANDOM Performed at [facility] [address] [place], [place] [address]    Special Requests   Final    NONE Performed at [facility], [place] [address] [place], [place] [address]    Culture (A)  Final    70,000 COLONIES/mL PROVIDENCIA STUARTII >=100,000 COLONIES/mL VANCOMYCIN RESISTANT ENTEROCOCCUS    Report Status 04/06/2020 FINAL  Final   Organism ID, Bacteria PROVIDENCIA STUARTII  (A)  Final   Organism ID, Bacteria VANCOMYCIN RESISTANT ENTEROCOCCUS (A)  Final      Susceptibility   Providencia stuartii - MIC*    AMPICILLIN >=32 RESISTANT Resistant     CEFAZOLIN >=64 RESISTANT Resistant     CEFEPIME 0.25 SENSITIVE Sensitive     CEFTRIAXONE >=64 RESISTANT Resistant     CIPROFLOXACIN >=4 RESISTANT Resistant     GENTAMICIN RESISTANT Resistant     IMIPENEM 2 SENSITIVE Sensitive     NITROFURANTOIN 128 RESISTANT Resistant     TRIMETH/SULFA 160 RESISTANT Resistant     AMPICILLIN/SULBACTAM >=32 RESISTANT Resistant     PIP/TAZO <=4 SENSITIVE Sensitive     * 70,000 COLONIES/mL  PROVIDENCIA STUARTII   Vancomycin resistant enterococcus - MIC*    AMPICILLIN <=2 SENSITIVE Sensitive     NITROFURANTOIN <=16 SENSITIVE Sensitive     VANCOMYCIN >=32 RESISTANT Resistant     LINEZOLID 2 SENSITIVE Sensitive     * >=100,000 COLONIES/mL VANCOMYCIN RESISTANT ENTEROCOCCUS         Radiology Studies: No results found.      Scheduled Meds: . diltiazem  120 mg Oral Daily  . gabapentin  300 mg Oral QHS  . guaiFENesin  1,200 mg Oral BID  . heparin injection (subcutaneous)  5,000 Units Subcutaneous Q8H  . insulin aspart  0-9 Units Subcutaneous TID WC  . lactulose  10 g Oral BID  . levothyroxine  88 mcg Oral Q0600  . metoprolol tartrate  25 mg Oral BID  . pantoprazole  40 mg Oral Daily  . polyethylene glycol  17 g Oral Q8H  . rifaximin  550 mg Oral BID  . sodium bicarbonate  650 mg Oral TID  . sodium chloride flush  3 mL Intravenous Q12H  . sodium phosphate  2 enema Rectal Once   Continuous Infusions: . sodium chloride Stopped (04/01/20 0906)  . sodium chloride 75 mL/hr at 04/08/20 1106  . piperacillin-tazobactam 2.25 g (04/08/20 1511)     LOS: 10 days    Time spent: 35 mins    Irine Seal, MD [facility]   To contact the attending provider between 7A-7P or the covering provider during after hours 7P-7A, please log into the web site [email or web address] and  access using universal  password for that web site. If you do not have the password, please call the hospital operator.  04/08/2020, 9:48 PM

## 2020-04-08 NOTE — Progress Notes (Signed)
Pt refused afternoon H&H lab draw. Dr. Grandville Silos made aware.

## 2020-04-08 NOTE — Progress Notes (Signed)
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 Pt presenting to ED with SOB, chest pain, and fatigue.  Found to have a hgb of 3.1 and has received 4 units of PRBC.  Pt had EGD that was significant.  Attempted colonscopy but with poor prep and GI recommending capsule endoscopy    PT Comments    Assisted to EOB but unable to attempt transfer to recliner.  General bed mobility comments: required Total Assist this session to tranfer to EOB.  Poor sitting tolerance with collapsed posture and inability to self maintain.  Also present with involuntary tremors so assisted back to bed.  Then Total Assist + 2 to perform side to side rolling to assist with peri care after BM.  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  Poor sitting tolerance with collapsed posture and inability to self maintain.  Also present with involuntary tremors so assisted back to bed.  Then Total Assist + 2 to perform side to side rolling to assist with peri care after BM.  Transfers                 General transfer comment: unable to attempt due to poor sitting ability and uncontrolled tremors.  Ambulation/Gait                  Stairs             Wheelchair Mobility    Modified Rankin (Stroke Patients Only)       Balance                                            Cognition Arousal/Alertness: Awake/alert Behavior During Therapy: WFL for tasks assessed/performed Overall Cognitive Status: Within Functional Limits for tasks assessed                                 General Comments: AxO x 3 aware and stated "I'm mean" but was cooperative.      Exercises      General Comments        Pertinent Vitals/Pain Pain Assessment: No/denies pain    Home Living                      Prior Function            PT Goals (current goals can now be found in the care plan section) Progress towards PT goals: Progressing toward goals    Frequency    Min 2X/week  PT Plan Current plan remains appropriate    Co-evaluation              AM-PAC PT "6 Clicks" Mobility   Outcome Measure  Help needed turning from your back to your side while in a flat bed without using bedrails?: Total Help needed moving from lying on your back to sitting on the side of a flat bed without using bedrails?: Total Help needed moving to and from a bed to a chair (including a wheelchair)?: Total Help needed standing up from a chair using your arms (e.g., wheelchair or bedside chair)?: Total Help needed to walk in hospital room?: Total Help needed climbing 3-5 steps with a railing? : Total 6 Click Score: 6    End of Session Equipment Utilized During Treatment: Gait belt Activity Tolerance: Other (comment);Patient limited by fatigue (dyskinesia) Patient left: in bed;with call bell/phone within reach;with bed alarm set Nurse Communication: Mobility status PT Visit Diagnosis: Muscle weakness (generalized) (M62.81);Unsteadiness on feet (R26.81)     Time: 7681-1572 PT Time Calculation (min) (ACUTE ONLY): 20 min  Charges:  $Therapeutic Activity: 8-22 mins                      Rica Koyanagi  PTA Acute  Rehabilitation Services Pager      [phone] Office      [phone]

## 2020-04-08 NOTE — Progress Notes (Signed)
[facility] Progress Note   Subjective:   She says no new complaints.  Refused PM medications - asking why she keeps being bothered.Cr 4.07 -> 4.3 today. Refused bladder scan so far.  Poor po intake - doesn't like food here.    Objective Vitals:   04/07/20 2134 04/08/20 0434 04/08/20 0439 04/08/20 1250  BP: (!) 152/64 (!) 136/53  (!) 146/63  Pulse: 100 (!) 105  96  Resp: 18 16  16   Temp: 98.4 F (36.9 C) 98.6 F (37 C)  98.6 F (37 C)  TempSrc: Oral Oral  Oral  SpO2: 98% 99%  97%  Weight:   66.1 kg   Height:       Physical Exam I/Os 2.1 / 0.1 --> per RN 3 unmeasured incontinent voids overnight General: lying in bed comfortable Heart:tachy, regular, no rub Lungs: clear ant and laterally Abdomen: soft, bladder not palpable Extremities:no edema Neuro: no asterixis, won't answer orientation questions, somewhat conversant  Additional Objective Labs: Basic Metabolic Panel: Recent Labs  Lab 04/06/20 0532 04/06/20 0532 04/07/20 0450 04/07/20 1537 04/08/20 0516  NA 133*   < > 126* 128* 128*  128*  K 4.3   < > 4.1 4.4 4.3  4.3  CL 102   < > 98 98 100  100  CO2 20*   < > 18* 19* 16*  17*  GLUCOSE 116*   < > 98 101* 84  84  BUN 42*   < > 43* 50* 51*  50*  CREATININE 2.95*   < > 3.62* 3.76* 4.28*  4.07*  CALCIUM 7.7*   < > 7.5* 7.7* 8.0*  8.0*  PHOS 5.1*  --  5.4*  --  6.9*  7.1*   < > = values in this interval not displayed.   Liver Function Tests: Recent Labs  Lab 04/06/20 0532 04/07/20 0450 04/08/20 0516  AST 28 36 44*  ALT 16 21 26   ALKPHOS 97 107 122  BILITOT 0.9 0.7 1.0  PROT 5.7* 5.7* 5.9*  ALBUMIN 2.5* 2.4* 2.4*  2.3*   No results for input(s): LIPASE, AMYLASE in the last 168 hours. CBC: Recent Labs  Lab 04/04/20 0431 04/04/20 1651 04/05/20 0542 04/05/20 0542 04/06/20 0532 04/06/20 0532 04/07/20 0450 04/07/20 1537 04/08/20 0516  WBC 14.7*  --  14.1*   < > 15.5*  --  14.4*  --  11.0*  NEUTROABS 9.5*  --  10.2*   < >  11.4*  --  10.0*  --  8.1*  HGB 9.0*   < > 8.6*   < > 8.4*   < > 8.0* 9.2* 9.2*  HCT 28.6*   < > 27.8*   < > 26.4*   < > 25.8* 29.0* 29.6*  MCV 80.8  --  82.7  --  80.2  --  80.4  --  80.9  PLT 264  --  244   < > 240  --  251  --  322   < > = values in this interval not displayed.   Blood Culture    Component Value Date/Time   SDES  04/04/2020 1508    URINE, RANDOM Performed at [facility], [place] [address] [place], [place] [address]    SPECREQUEST  04/04/2020 1508    NONE Performed at [facility], [place] [address] [place], [place] [address]    CULT (A) 04/04/2020 1508    70,000 COLONIES/mL PROVIDENCIA STUARTII >=100,000 COLONIES/mL VANCOMYCIN RESISTANT ENTEROCOCCUS  REPTSTATUS 04/06/2020 FINAL 04/04/2020 1508    Cardiac Enzymes: No results for input(s): CKTOTAL, CKMB, CKMBINDEX, TROPONINI in the last 168 hours. CBG: Recent Labs  Lab 04/07/20 1249 04/07/20 1656 04/07/20 2221 04/08/20 0731 04/08/20 1245  GLUCAP 99 100* 86 89 81   Iron Studies: No results for input(s): IRON, TIBC, TRANSFERRIN, FERRITIN in the last 72 hours. @lablastinr3 @ Studies/Results: US RENAL  Result Date: 04/06/2020 CLINICAL DATA:  Acute kidney injury EXAM: RENAL / URINARY TRACT ULTRASOUND COMPLETE COMPARISON:  CT 04/04/2020, MRI 04/01/2020 FINDINGS: Right Kidney: Renal measurements: 8.3 x 5.3 x 5.1 cm = volume: 117 mL. Diffusely increased renal cortical echogenicity. Simple appearing cyst present in the upper pole measuring up to 1.5 cm. No concerning renal mass, calculus or hydronephrosis. Additional smaller cysts seen on comparison MRI are less well visualized. Left Kidney: Renal measurements: 10.0 x 5.4 x 5.6 cm = volume: 159 mL. Diffusely increased renal cortical echogenicity. Several small cysts present throughout the left kidney, largest measuring up to 1.2 cm in the lower pole. No concerning renal mass, calculus or hydronephrosis. Bladder: Appears normal for  degree of bladder distention. Other: Incidental note made of trace ascites and bilateral pleural effusions. A calcified gallstone is also noted within the gallbladder lumen with posterior shadowing. IMPRESSION: 1. Increased renal cortical echogenicity bilaterally, compatible with medical renal disease. 2. Few small simple appearing cysts in both kidneys. Several smaller cysts seen on comparison MR imaging are not as well visualized. 3. Trace ascites and bilateral pleural effusions. 4. Cholelithiasis. Electronically Signed   By: Lovena Le M.D.   On: 04/06/2020 19:10   Medications: . sodium chloride Stopped (04/01/20 0906)  . sodium chloride 75 mL/hr at 04/08/20 1106  . piperacillin-tazobactam 2.25 g (04/08/20 1007)   . diltiazem  120 mg Oral Daily  . gabapentin  300 mg Oral QHS  . guaiFENesin  1,200 mg Oral BID  . heparin injection (subcutaneous)  5,000 Units Subcutaneous Q8H  . insulin aspart  0-9 Units Subcutaneous TID WC  . lactulose  10 g Oral BID  . levothyroxine  88 mcg Oral Q0600  . metoprolol tartrate  25 mg Oral BID  . pantoprazole  40 mg Oral Daily  . polyethylene glycol  17 g Oral Q8H  . rifaximin  550 mg Oral BID  . sodium bicarbonate  650 mg Oral TID  . sodium chloride flush  3 mL Intravenous Q12H  . sodium phosphate  2 enema Rectal Once    Dialysis Orders:  Assessment/Plan **AKI on CKD 4:  Baseline creatinine in the 2+ range of late - was at baseline on presentation but developed AKI mid hospitalization.  No hypotension.  The only potential insult I see is contrast administration on 11/5 which temporally fits with the AKI.  She does have a UTI that is being treated but no evidence of AIN and I wouldn't change antibiotics.  At this point she has no indications for dialysis but may develop in the coming days.  We discussed and she's not sure if she would consent to dialysis or not - I asked her to consider.  She is not a good long term dialysis candidate.  Cont supportive care  with MIVF given poor appetite today, avoid nephrotoxins.  Cont bladder scans to ensure not retaining given refusing foley -- encouraged her to allow there RN to do. Decreased gabapentin from 300 BID to 300 qhs.  **Multiorganism UTI:  Antibiotics per primary - I don't think they need to be changed as  I don't suspect AIN.  **Anemia:  S/p transfusion - GI eval, no frank bleeding.  Further transfusion per primary.  Follows with hematology for leukocytosis, perhaps an ESA could be considered if Hb trending down..   **Metabolic acidosis: secondary to AKI, started po bicarb.    **Hyponatremia:  Appears fairly euvolemic - ?SIADH but nearly anuric now.   Avoid hypotonic fluids; placed on 1245mL fluid restriction.   **Cirrhosis: new dx.   Will follow, page with concerns.   Jannifer Hick MD 04/08/2020, 1:47 PM  [facility] Pager: [phone]

## 2020-04-09 DIAGNOSIS — E039 Hypothyroidism, unspecified: Secondary | ICD-10-CM | POA: Diagnosis not present

## 2020-04-09 DIAGNOSIS — N179 Acute kidney failure, unspecified: Secondary | ICD-10-CM | POA: Diagnosis not present

## 2020-04-09 DIAGNOSIS — D649 Anemia, unspecified: Secondary | ICD-10-CM | POA: Diagnosis not present

## 2020-04-09 DIAGNOSIS — I5033 Acute on chronic diastolic (congestive) heart failure: Secondary | ICD-10-CM | POA: Diagnosis not present

## 2020-04-09 LAB — CBC WITH DIFFERENTIAL/PLATELET
Abs Immature Granulocytes: 0.1 10*3/uL — ABNORMAL HIGH (ref 0.00–0.07)
Basophils Absolute: 0 10*3/uL (ref 0.0–0.1)
Basophils Relative: 0 %
Eosinophils Absolute: 0.7 10*3/uL — ABNORMAL HIGH (ref 0.0–0.5)
Eosinophils Relative: 6 %
HCT: 28.5 % — ABNORMAL LOW (ref 36.0–46.0)
Hemoglobin: 9.1 g/dL — ABNORMAL LOW (ref 12.0–15.0)
Immature Granulocytes: 1 %
Lymphocytes Relative: 17 %
Lymphs Abs: 2 10*3/uL (ref 0.7–4.0)
MCH: 25.3 pg — ABNORMAL LOW (ref 26.0–34.0)
MCHC: 31.9 g/dL (ref 30.0–36.0)
MCV: 79.2 fL — ABNORMAL LOW (ref 80.0–100.0)
Monocytes Absolute: 1.4 10*3/uL — ABNORMAL HIGH (ref 0.1–1.0)
Monocytes Relative: 12 %
Neutro Abs: 7.8 10*3/uL — ABNORMAL HIGH (ref 1.7–7.7)
Neutrophils Relative %: 64 %
Platelets: 353 10*3/uL (ref 150–400)
RBC: 3.6 MIL/uL — ABNORMAL LOW (ref 3.87–5.11)
RDW: 27.9 % — ABNORMAL HIGH (ref 11.5–15.5)
WBC: 12.1 10*3/uL — ABNORMAL HIGH (ref 4.0–10.5)
nRBC: 0 % (ref 0.0–0.2)

## 2020-04-09 LAB — RENAL FUNCTION PANEL
Albumin: 2.1 g/dL — ABNORMAL LOW (ref 3.5–5.0)
Anion gap: 12 (ref 5–15)
BUN: 50 mg/dL — ABNORMAL HIGH (ref 8–23)
CO2: 17 mmol/L — ABNORMAL LOW (ref 22–32)
Calcium: 7.8 mg/dL — ABNORMAL LOW (ref 8.9–10.3)
Chloride: 104 mmol/L (ref 98–111)
Creatinine, Ser: 4.36 mg/dL — ABNORMAL HIGH (ref 0.44–1.00)
GFR, Estimated: 10 mL/min — ABNORMAL LOW (ref >60)
Glucose, Bld: 100 mg/dL — ABNORMAL HIGH (ref 70–99)
Phosphorus: 7 mg/dL — ABNORMAL HIGH (ref 2.5–4.6)
Potassium: 3.9 mmol/L (ref 3.5–5.1)
Sodium: 133 mmol/L — ABNORMAL LOW (ref 135–145)

## 2020-04-09 LAB — CULTURE, BLOOD (ROUTINE X 2)
Culture: NO GROWTH
Culture: NO GROWTH
Special Requests: ADEQUATE
Special Requests: ADEQUATE

## 2020-04-09 LAB — GLUCOSE, CAPILLARY
Glucose-Capillary: 96 mg/dL (ref 70–99)
Glucose-Capillary: 102 mg/dL — ABNORMAL HIGH (ref 70–99)
Glucose-Capillary: 113 mg/dL — ABNORMAL HIGH (ref 70–99)
Glucose-Capillary: 114 mg/dL — ABNORMAL HIGH (ref 70–99)

## 2020-04-09 MED ORDER — TRAMADOL HCL 50 MG PO TABS
50.0000 mg | ORAL_TABLET | Freq: Two times a day (BID) | ORAL | Status: DC | PRN
  Administered 2020-04-20 – 2020-04-25 (×5): 50 mg via ORAL
  Filled 2020-04-09 (×6): qty 1

## 2020-04-09 MED ORDER — ACETAMINOPHEN 325 MG PO TABS
650.0000 mg | ORAL_TABLET | Freq: Four times a day (QID) | ORAL | Status: DC | PRN
  Administered 2020-04-17 – 2020-04-22 (×2): 650 mg via ORAL
  Filled 2020-04-09 (×2): qty 2

## 2020-04-09 NOTE — Progress Notes (Addendum)
[facility] Progress Note   Subjective:   She says no new complaints but she's hungry.  No dyspnea, nausea, hiccups. UOP documented as 115mL yesterday but has some incontinence  Objective Vitals:   04/08/20 1250 04/08/20 2119 04/09/20 0357 04/09/20 0437  BP: (!) 146/63 (!) 161/82 (!) 155/80   Pulse: 96 99 72   Resp: 16 20 16    Temp: 98.6 F (37 C) 98.7 F (37.1 C) 98.6 F (37 C)   TempSrc: Oral Oral Oral   SpO2: 97% 97% 99%   Weight:    67.4 kg  Height:       Physical Exam  General: lying in bed comfortable Heart:tachy, regular, no rub Lungs: clear ant and laterally Abdomen: soft, bladder not palpable Extremities:1+ LE edema Neuro: no asterixis  Additional Objective Labs: Basic Metabolic Panel: Recent Labs  Lab 04/07/20 0450 04/07/20 0450 04/07/20 1537 04/08/20 0516 04/09/20 0621  NA 126*   < > 128* 128*  128* 133*  K 4.1   < > 4.4 4.3  4.3 3.9  CL 98   < > 98 100  100 104  CO2 18*   < > 19* 16*  17* 17*  GLUCOSE 98   < > 101* 84  84 100*  BUN 43*   < > 50* 51*  50* 50*  CREATININE 3.62*   < > 3.76* 4.28*  4.07* 4.36*  CALCIUM 7.5*   < > 7.7* 8.0*  8.0* 7.8*  PHOS 5.4*  --   --  6.9*  7.1* 7.0*   < > = values in this interval not displayed.   Liver Function Tests: Recent Labs  Lab 04/06/20 0532 04/06/20 0532 04/07/20 0450 04/08/20 0516 04/09/20 0621  AST 28  --  36 44*  --   ALT 16  --  21 26  --   ALKPHOS 97  --  107 122  --   BILITOT 0.9  --  0.7 1.0  --   PROT 5.7*  --  5.7* 5.9*  --   ALBUMIN 2.5*   < > 2.4* 2.4*  2.3* 2.1*   < > = values in this interval not displayed.   No results for input(s): LIPASE, AMYLASE in the last 168 hours. CBC: Recent Labs  Lab 04/05/20 0542 04/05/20 0542 04/06/20 0532 04/06/20 0532 04/07/20 0450 04/07/20 0450 04/07/20 1537 04/08/20 0516 04/09/20 0621  WBC 14.1*   < > 15.5*   < > 14.4*  --   --  11.0* 12.1*  NEUTROABS 10.2*   < > 11.4*   < > 10.0*  --   --  8.1* 7.8*  HGB 8.6*    < > 8.4*   < > 8.0*   < > 9.2* 9.2* 9.1*  HCT 27.8*   < > 26.4*   < > 25.8*   < > 29.0* 29.6* 28.5*  MCV 82.7  --  80.2  --  80.4  --   --  80.9 79.2*  PLT 244   < > 240   < > 251  --   --  322 353   < > = values in this interval not displayed.   Blood Culture    Component Value Date/Time   SDES  04/04/2020 1508    URINE, RANDOM Performed at [facility], [place] [address], [place], [place] [address]    SPECREQUEST  04/04/2020 1508    NONE Performed at [facility], [place] [address] [place],  [place] [address]    CULT (A) 04/04/2020 1508    70,000 COLONIES/mL PROVIDENCIA STUARTII >=100,000 COLONIES/mL VANCOMYCIN RESISTANT ENTEROCOCCUS    REPTSTATUS 04/06/2020 FINAL 04/04/2020 1508    Cardiac Enzymes: No results for input(s): CKTOTAL, CKMB, CKMBINDEX, TROPONINI in the last 168 hours. CBG: Recent Labs  Lab 04/08/20 0731 04/08/20 1245 04/08/20 1739 04/08/20 2116 04/09/20 0753  GLUCAP 89 81 106* 114* 96   Iron Studies: No results for input(s): IRON, TIBC, TRANSFERRIN, FERRITIN in the last 72 hours. @lablastinr3 @ Studies/Results: No results found. Medications: . sodium chloride Stopped (04/01/20 0906)  . piperacillin-tazobactam 2.25 g (04/09/20 0857)   . diltiazem  120 mg Oral Daily  . gabapentin  300 mg Oral QHS  . guaiFENesin  1,200 mg Oral BID  . heparin injection (subcutaneous)  5,000 Units Subcutaneous Q8H  . insulin aspart  0-9 Units Subcutaneous TID WC  . lactulose  10 g Oral BID  . levothyroxine  88 mcg Oral Q0600  . metoprolol tartrate  25 mg Oral BID  . pantoprazole  40 mg Oral Daily  . polyethylene glycol  17 g Oral Q8H  . rifaximin  550 mg Oral BID  . sodium bicarbonate  650 mg Oral TID  . sodium chloride flush  3 mL Intravenous Q12H    Dialysis Orders:  Assessment/Plan **AKI on CKD 4:  Baseline creatinine in the 2+ range of late - was at baseline on presentation but developed AKI mid hospitalization.  No  hypotension.  The only potential insult I see is contrast administration on 11/5 which temporally fits with the AKI.  She does have a UTI that is being treated but no evidence of AIN and I wouldn't change antibiotics.   At this point it appears perhaps creatinine is plateauing and I hope we see improvement in the next few days and dialysis can be avoided.  Cont bladder scans - has had borderline scans some but refused I/O or foley --> nothing more than 239mL so I don't think this is a large contributor to the AKI.  She's developing edema and is reporting a better appetite today so I d/c'd her IVF.  **Multiorganism UTI:  Antibiotics per primary - I don't think they need to be changed as I don't suspect AIN.  **Anemia:  S/p transfusion - GI eval, no frank bleeding.  Further transfusion per primary.  Follows with hematology for leukocytosis, perhaps an ESA could be considered if Hb trending down but it's stable in the 9s at the moment.  **Metabolic acidosis: secondary to AKI, started po bicarb.    **Hyponatremia: ?SIADH.  Improving - developing edema so will d/c fluids.   Avoid hypotonic fluids; on 1222mL fluid restriction.   **Cirrhosis: new dx.   Will follow, page with concerns.   Jannifer Hick MD 04/09/2020, 11:31 AM  [facility] Pager: [phone]

## 2020-04-09 NOTE — Progress Notes (Signed)
PROGRESS NOTE    Sarah Colon  WVP:[id number] DOB: Apr 25, 1946 DOA: 02/29/2020 PCP: Patient, No Pcp Per    Chief Complaint  Patient presents with  . Cough  . Shortness of Breath    Brief Narrative:  The patient is a 74 year old African-American female with a past medical history significant for but not limited to diabetes mellitus type 2, diastolic CHF, hypertension, paroxysmal atrial fibrillation, chronic kidney disease stage IV with a baseline creatinine of 1.5-1.8 as well as other comorbidities who presented to the ED with shortness of breath, chest pain, fatigue over the last week.  She also reported a poor appetite.  She is found to have a hemoglobin of 3 on admission.  There are no reports of blood in her stool or dark or tarry stools.  Fecal occult test in the ED was positive.  GI was consulted and she was admitted to the hospitalist service.  She normally lives in SNF is nonambulatory and in the wheelchair.  She underwent EGD and colonoscopy without evidence of any bleeding.  She had multiple polyp removal from her colonoscopy today.  Her prep was poor.  GI recommending resuming Xarelto 04/03/20 and she received another dose of diuresis yesterday given her volume overload.  Her Xarelto is now resumed today but this evening on 04/04/2019 when she spiked a temperature and she does have a leukocytosis which is chronic but we will panculture to rule out infection.  She is complaining of some abdominal pain so we will get a CT of the abdomen pelvis.  Chest x-ray shows possible pneumonia so she was started on Unasyn and SLP was consulted given the location being the Right Lung Base.  **After further workup it appears the patient has a UTI with Providencia and Entercoccus Faecalis. We will escalate her antibiotics to IV meropenem and hold off of Vancomycin for now. Repeat CT scan showed persistent mild perirectal and presacral fat stranding of unclear etiology and presacral rectal findings  associate with cortical erosions, sclerosis and thinning of the S1 vertebral body. As well as the urinary bladder that was thickened circumferentially. Hemoglobin has dropped a little bit GI feels that the CT findings are in the setting of fecal impaction and due to stool inflammation from the fecal impaction. They feel that her anemia worsens and she will need a flex sig with enemas for prep prior to discharge to reevaluate the rectum after resolution of the fecal impaction. We will escalate antibiotics given her UTI.  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  Her renal function continues to worsen and we are doing further work-up will obtain a bladder scan, renal ultrasound and started the patient back on gentle IV fluids with normal saline at 25 mils per hour.  We are to place a Foley catheter for strict I's and O's however patient refused this.  We will continue monitor and will notify nephrology about her worsening renal function.  Nephrology feels that Contrast from last CT scan caused her AKI and GI has signed off and feel as if her Abdominal Pain is multifactorial. They are going to hold off on Flex Sigmoidoscopy.     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  Currently on heparin.  FOBT which was done was positive.  Patient status post  transfusion of 3 units packed red blood cells.  Hemoglobin on admission was 3.1 and currently at 9.2.  Patient with no overt bleeding.  Patient seen in consultation by GI underwent upper endoscopy 04/17/2020 without any significant findings or source of GI bleed.  Patient underwent rescheduled colonoscopy 03/31/2020 with poor prep and showed 2 to 5 mm polyps in the cecum, 3 5-9MM polyps in the transverse colon which were removed.  Biopsies consistent with tubular adenoma without high-grade dysplasia.  Due to poor prep GI recommending repeat colonoscopy in 6 months.  Patient currently on a soft diet.  Could likely resume Xarelto in the next 1 to 2 days if hemoglobin remained stable and improvement with renal function.  CT abdomen and pelvis was done which showed focal rectal wall thickening, acute cellular fluid as well as circumferential thickened bladder.  Leukocytosis trending down.  GI was reconsulted and patient given a soapsuds enema.  Per GI if continued drop in hemoglobin will need a flex sigmoidoscopy.  Currently holding off on flex sigmoidoscopy.  GI has signed off.  Follow H&H.  Transfusion threshold hemoglobin <7.  2.  Left lower quadrant abdominal pain CT abdomen and pelvis done without any acute findings.  Patient noted to have had a fever with a leukocytosis.  Fever likely secondary from UTI.  CT with focal rectal wall thickening of acute cellular fluid as well as circumferentially thickened bladder.  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  On empiric IV antibiotics.  Clinical improvement.  Follow.  3.  Liver cirrhosis New diagnosis.  MRI of abdomen was degraded exam however no concerning lesions noted repeat MRI recommended which can be done in the outpatient setting.  Continue Xifaxan, lactulose.  Outpatient follow-up with GI.  4.  Acute metabolic encephalopathy Likely secondary to symptomatic profound anemia.  Ammonia levels within normal limits.  Encephalopathy resolved  and currently at baseline.  5.  Acute renal failure on chronic kidney disease stage IV Baseline creatinine approximately 2.  Patient seen in consultation by nephrology who do not feel patient has any evidence of AIN and do not recommend any change in antibiotics.  Patient could likely have developed acute insult on chronic kidney disease stage IV from contrast nephropathy from CT done 04/04/2020.  He had 4.36 from 4.28 from 4.07 from 3.76 from 3.62 from 2.95 from 1.87 from 1.41 from 2.30 on admission.  Nephrology does not feel patient is a good long-term dialysis candidate but  discussed with patient for temporary dialysis if needed.  Patient with a urine output not properly recorded.  Patient developing some lower extremity edema.  IV fluids management per nephrology.  Gabapentin dose decreased to 300 mg nightly.  Appreciate nephrology's input and recommendations.   6.  Providencia stuartii and VRE UTI Continue IV Zosyn.  Supportive care.  Follow.  7.  Metabolic acidosis Likely secondary to acute kidney injury.  Improving on bicarb tablets.  Follow.   8.  Hyponatremia ??  SIADH.  Patient on 1200 cc fluid restriction per nephrology.  Sodium  level improving.  Sodium currently at 133.  Patient with some lower extremity edema.  On IV fluids which are being managed per nephrology.  Follow.  9.  Hyperkalemia Resolved with Lokelma.  10.  Acute on chronic diastolic CHF Patient noted during the hospitalization early on to be volume overloaded on examination with lower extremity edema.  BNP elevated at 330 however patient with chronic kidney disease.  Pleural effusions noted on chest x-ray.  Patient status post intermittent diuresis with clinical improvement.  Currently euvolemic.  Follow.  11.  Hypothyroidism Continue Synthroid.  12.  Well-controlled diabetes mellitus type 2 Hemoglobin A1c 5.6.  CBG of 96 this morning.  Sliding scale insulin.  13.  Paroxysmal atrial fibrillation Beta-blocker for  rate control.  Xarelto initially held.  Hemoglobin currently stable at 9.1.  If hemoglobin is stable tomorrow we will resume home regimen Xarelto.  Follow.  13. Sacral ulcer, POA Pressure Injury 04/27/2020 Sacrum Medial Stage III -  Full thickness tissue loss. Subcutaneous fat may be visible but bone, tendon or muscle are NOT exposed. (Active)  04/06/2020 1149  Location: Sacrum  Location Orientation: Medial  Staging: Stage III -  Full thickness tissue loss. Subcutaneous fat may be visible but bone, tendon or muscle are NOT exposed.  Wound Description (Comments):   Present on Admission: Yes         DVT prophylaxis: SCDs>>> Heparin Code Status: DNR Family Communication: Updated patient.  No family at bedside. Disposition:   Status is: Inpatient    Dispo: The patient is from: SNF              Anticipated d/c is to: SNF              Anticipated d/c date is: To be determined.  When cleared by nephrology.              Patient currently with acute on chronic kidney disease stage IV, being followed by nephrology, UTI on IV antibiotics, anemia status post transfusion.  Not stable for discharge.       Consultants:   Nephrology: Dr. Johnney Ou 04/07/2020  Gastroenterology: Dr. Alessandra Bevels 03/30/2020  Procedures:   Transfuse 3 units packed red blood cells 03/11/2020--04/28/2020  Colonoscopy 04/24/2020--per Dr. Alessandra Bevels  Upper endoscopy 04/26/2020--by Dr. Alessandra Bevels  CT abdomen and pelvis 04/04/2020, 03/05/2020  CT pelvis 03/17/2020  Chest x-ray 03/11/2020, 03/30/2020, 04/03/2020  MRI liver 04/01/2020  Renal ultrasound 04/06/2020  2D echo 03/30/2020  Antimicrobials:  Anti-infectives (From admission, onward)   Start     Dose/Rate Route Frequency Ordered Stop   04/06/20 1430  piperacillin-tazobactam (ZOSYN) IVPB 2.25 g        2.25 g 100 mL/hr over 30 Minutes Intravenous Every 6 hours 04/06/20 1320     04/05/20 1930  meropenem (MERREM) 1 g in sodium chloride 0.9 % 100 mL IVPB   Status:  Discontinued        1 g 200 mL/hr over 30 Minutes Intravenous Every 12 hours 04/05/20 1832 04/06/20 1320   04/04/20 1000  Ampicillin-Sulbactam (UNASYN) 3 g in sodium chloride 0.9 % 100 mL IVPB  Status:  Discontinued        3 g 200 mL/hr over 30 Minutes Intravenous Every 12 hours 04/04/20 0851 04/05/20 1829   03/30/20 1400  rifaximin (XIFAXAN) tablet 550 mg        550 mg Oral 2 times daily 03/30/20 1136         Subjective: Patient laying in bed.  Denies any chest pain  or shortness of breath.  Still with complaints of left lower quadrant abdominal pain.  Improvement with fatigue.  No overt bleeding.    Objective: Vitals:   04/08/20 1250 04/08/20 2119 04/09/20 0357 04/09/20 0437  BP: (!) 146/63 (!) 161/82 (!) 155/80   Pulse: 96 99 72   Resp: 16 20 16    Temp: 98.6 F (37 C) 98.7 F (37.1 C) 98.6 F (37 C)   TempSrc: Oral Oral Oral   SpO2: 97% 97% 99%   Weight:    67.4 kg  Height:        Intake/Output Summary (Last 24 hours) at 04/09/2020 1108 Last data filed at 04/09/2020 0451 Gross per 24 hour  Intake 899.04 ml  Output 150 ml  Net 749.04 ml   Filed Weights   04/07/20 0500 04/08/20 0439 04/09/20 0437  Weight: 63.2 kg 66.1 kg 67.4 kg    Examination:  General exam: NAD Respiratory system: CTAB.  No wheezes, no crackles, no rhonchi.  Normal respiratory effort.  Cardiovascular system: Regular rate rhythm no murmurs rubs or gallops.  No JVD.  1-2+ bilateral lower extremity edema. Gastrointestinal system: Abdomen is soft, nondistended, positive bowel sounds, tender to palpation left lower quadrant.  No rebound.  No guarding.  Central nervous system: Alert and oriented. No focal neurological deficits. Extremities: Symmetric 5 x 5 power. Skin: No rashes, lesions or ulcers Psychiatry: Judgement and insight appear normal. Mood & affect appropriate.     Data Reviewed: I have personally reviewed following labs and imaging studies  CBC: Recent Labs  Lab  04/05/20 0542 04/05/20 0542 04/06/20 0532 04/07/20 0450 04/07/20 1537 04/08/20 0516 04/09/20 0621  WBC 14.1*  --  15.5* 14.4*  --  11.0* 12.1*  NEUTROABS 10.2*  --  11.4* 10.0*  --  8.1* 7.8*  HGB 8.6*   < > 8.4* 8.0* 9.2* 9.2* 9.1*  HCT 27.8*   < > 26.4* 25.8* 29.0* 29.6* 28.5*  MCV 82.7  --  80.2 80.4  --  80.9 79.2*  PLT 244  --  240 251  --  322 353   < > = values in this interval not displayed.    Basic Metabolic Panel: Recent Labs  Lab 04/04/20 0431 04/04/20 0431 04/05/20 0542 04/05/20 0542 04/06/20 0532 04/07/20 0450 04/07/20 1537 04/08/20 0516 04/09/20 0621  NA 136   < > 132*   < > 133* 126* 128* 128*  128* 133*  K 4.3   < > 4.0   < > 4.3 4.1 4.4 4.3  4.3 3.9  CL 104   < > 102   < > 102 98 98 100  100 104  CO2 19*   < > 19*   < > 20* 18* 19* 16*  17* 17*  GLUCOSE 141*   < > 164*   < > 116* 98 101* 84  84 100*  BUN 35*   < > 34*   < > 42* 43* 50* 51*  50* 50*  CREATININE 1.41*   < > 1.87*   < > 2.95* 3.62* 3.76* 4.28*  4.07* 4.36*  CALCIUM 8.3*   < > 8.0*   < > 7.7* 7.5* 7.7* 8.0*  8.0* 7.8*  MG 1.6*  --  2.3  --  2.2 2.0  --  2.1  --   PHOS 3.4   < > 4.1  --  5.1* 5.4*  --  6.9*  7.1* 7.0*   < > = values in this interval not displayed.  GFR: Estimated Creatinine Clearance: 9.8 mL/min (A) (by C-G formula based on SCr of 4.36 mg/dL (H)).  Liver Function Tests: Recent Labs  Lab 04/04/20 0431 04/04/20 0431 04/05/20 0542 04/06/20 0532 04/07/20 0450 04/08/20 0516 04/09/20 0621  AST 18  --  17 28 36 44*  --   ALT 11  --  11 16 21 26   --   ALKPHOS 80  --  78 97 107 122  --   BILITOT 0.7  --  0.6 0.9 0.7 1.0  --   PROT 6.1*  --  6.1* 5.7* 5.7* 5.9*  --   ALBUMIN 2.8*   < > 2.7* 2.5* 2.4* 2.4*  2.3* 2.1*   < > = values in this interval not displayed.    CBG: Recent Labs  Lab 04/08/20 0731 04/08/20 1245 04/08/20 1739 04/08/20 2116 04/09/20 0753  GLUCAP 89 81 106* 114* 96     Recent Results (from the past 240 hour(s))  MRSA PCR  Screening     Status: Abnormal   Collection Time: 04/17/2020  7:44 AM   Specimen: Nasopharyngeal  Result Value Ref Range Status   MRSA by PCR POSITIVE (A) NEGATIVE Final    Comment:        The GeneXpert MRSA Assay (FDA approved for NASAL specimens only), is one component of a comprehensive MRSA colonization surveillance program. It is not intended to diagnose MRSA infection nor to guide or monitor treatment for MRSA infections. RESULT CALLED TO, READ BACK BY AND VERIFIED WITH: DARK,D. RN @1111  ON 11.01.2021 BY COHEN,K Performed at [facility], [place] [address], [place], [place] [address]   Culture, blood (Routine X 2) w Reflex to ID Panel     Status: None   Collection Time: 04/04/20  6:25 AM   Specimen: BLOOD  Result Value Ref Range Status   Specimen Description   Final    BLOOD RIGHT ANTECUBITAL Performed at [facility] [address] [place], [place] [address]    Special Requests   Final    BOTTLES DRAWN AEROBIC ONLY Blood Culture adequate volume Performed at [facility] [address] [place], [place] [address]    Culture   Final    NO GROWTH 5 DAYS Performed at [facility], [place] [address], [place], [place] [address]    Report Status 04/09/2020 FINAL  Final  Culture, blood (Routine X 2) w Reflex to ID Panel     Status: None   Collection Time: 04/04/20  6:25 AM   Specimen: BLOOD RIGHT HAND  Result Value Ref Range Status   Specimen Description   Final    BLOOD RIGHT HAND Performed at [facility] [address], [place], [place] [address]    Special Requests   Final    BOTTLES DRAWN AEROBIC AND ANAEROBIC Blood Culture adequate volume Performed at [facility] [address] [place], [place] [address]    Culture   Final    NO GROWTH 5 DAYS Performed at [facility], [place] [address] [place], [place] [address]    Report Status 04/09/2020 FINAL  Final   Culture, Urine     Status: Abnormal   Collection Time: 04/04/20  3:08 PM   Specimen: Urine, Random  Result Value Ref Range Status   Specimen Description   Final    URINE, RANDOM Performed at [facility] [address] [place], [place] [address]    Special  Requests   Final    NONE Performed at [facility], [place] [address] [place], [place] [address]    Culture (A)  Final    70,000 COLONIES/mL PROVIDENCIA STUARTII >=100,000 COLONIES/mL VANCOMYCIN RESISTANT ENTEROCOCCUS    Report Status 04/06/2020 FINAL  Final   Organism ID, Bacteria PROVIDENCIA STUARTII (A)  Final   Organism ID, Bacteria VANCOMYCIN RESISTANT ENTEROCOCCUS (A)  Final      Susceptibility   Providencia stuartii - MIC*    AMPICILLIN >=32 RESISTANT Resistant     CEFAZOLIN >=64 RESISTANT Resistant     CEFEPIME 0.25 SENSITIVE Sensitive     CEFTRIAXONE >=64 RESISTANT Resistant     CIPROFLOXACIN >=4 RESISTANT Resistant     GENTAMICIN RESISTANT Resistant     IMIPENEM 2 SENSITIVE Sensitive     NITROFURANTOIN 128 RESISTANT Resistant     TRIMETH/SULFA 160 RESISTANT Resistant     AMPICILLIN/SULBACTAM >=32 RESISTANT Resistant     PIP/TAZO <=4 SENSITIVE Sensitive     * 70,000 COLONIES/mL PROVIDENCIA STUARTII   Vancomycin resistant enterococcus - MIC*    AMPICILLIN <=2 SENSITIVE Sensitive     NITROFURANTOIN <=16 SENSITIVE Sensitive     VANCOMYCIN >=32 RESISTANT Resistant     LINEZOLID 2 SENSITIVE Sensitive     * >=100,000 COLONIES/mL VANCOMYCIN RESISTANT ENTEROCOCCUS         Radiology Studies: No results found.      Scheduled Meds: . diltiazem  120 mg Oral Daily  . gabapentin  300 mg Oral QHS  . guaiFENesin  1,200 mg Oral BID  . heparin injection (subcutaneous)  5,000 Units Subcutaneous Q8H  . insulin aspart  0-9 Units Subcutaneous TID WC  . lactulose  10 g Oral BID  . levothyroxine  88 mcg Oral Q0600  . metoprolol tartrate  25 mg Oral BID  . pantoprazole  40 mg Oral  Daily  . polyethylene glycol  17 g Oral Q8H  . rifaximin  550 mg Oral BID  . sodium bicarbonate  650 mg Oral TID  . sodium chloride flush  3 mL Intravenous Q12H   Continuous Infusions: . sodium chloride Stopped (04/01/20 0906)  . sodium chloride 75 mL/hr at 04/08/20 1106  . piperacillin-tazobactam 2.25 g (04/09/20 0857)     LOS: 11 days    Time spent: 35 mins    Irine Seal, MD [facility]   To contact the attending provider between 7A-7P or the covering provider during after hours 7P-7A, please log into the web site [email or web address] and access using universal [place] password for that web site. If you do not have the password, please call the hospital operator.  04/09/2020, 11:08 AM

## 2020-04-10 DIAGNOSIS — I5033 Acute on chronic diastolic (congestive) heart failure: Secondary | ICD-10-CM | POA: Diagnosis not present

## 2020-04-10 DIAGNOSIS — N179 Acute kidney failure, unspecified: Secondary | ICD-10-CM | POA: Diagnosis not present

## 2020-04-10 DIAGNOSIS — E039 Hypothyroidism, unspecified: Secondary | ICD-10-CM | POA: Diagnosis not present

## 2020-04-10 DIAGNOSIS — D649 Anemia, unspecified: Secondary | ICD-10-CM | POA: Diagnosis not present

## 2020-04-10 LAB — GLUCOSE, CAPILLARY
Glucose-Capillary: 105 mg/dL — ABNORMAL HIGH (ref 70–99)
Glucose-Capillary: 113 mg/dL — ABNORMAL HIGH (ref 70–99)
Glucose-Capillary: 108 mg/dL — ABNORMAL HIGH (ref 70–99)
Glucose-Capillary: 105 mg/dL — ABNORMAL HIGH (ref 70–99)

## 2020-04-10 MED ORDER — FUROSEMIDE 10 MG/ML IJ SOLN: 60.0000 mg | Freq: Once | INTRAMUSCULAR | Status: DC

## 2020-04-10 NOTE — Progress Notes (Signed)
[facility] Progress Note   Subjective:   Refusing labs again.  Doesn't like the food here but denies dysgeusia.  Objective Vitals:   04/09/20 1327 04/09/20 2100 04/10/20 0355 04/10/20 0500  BP: (!) 167/51 (!) 164/69 (!) 148/74   Pulse: 80 68 73   Resp: 20  18   Temp: 98.2 F (36.8 C) 97.7 F (36.5 C) 97.7 F (36.5 C)   TempSrc: Oral Oral Axillary   SpO2: 100% 97% 100%   Weight:    66.7 kg  Height:       Physical Exam  General: lying in bed comfortable Heart:tachy, regular, no rub Lungs: clear ant and laterally Abdomen: soft, bladder not palpable Extremities:1+ LE edema Neuro: no asterixis  Additional Objective Labs: Basic Metabolic Panel: Recent Labs  Lab 04/07/20 0450 04/07/20 0450 04/07/20 1537 04/08/20 0516 04/09/20 0621  NA 126*   < > 128* 128*  128* 133*  K 4.1   < > 4.4 4.3  4.3 3.9  CL 98   < > 98 100  100 104  CO2 18*   < > 19* 16*  17* 17*  GLUCOSE 98   < > 101* 84  84 100*  BUN 43*   < > 50* 51*  50* 50*  CREATININE 3.62*   < > 3.76* 4.28*  4.07* 4.36*  CALCIUM 7.5*   < > 7.7* 8.0*  8.0* 7.8*  PHOS 5.4*  --   --  6.9*  7.1* 7.0*   < > = values in this interval not displayed.   Liver Function Tests: Recent Labs  Lab 04/06/20 0532 04/06/20 0532 04/07/20 0450 04/08/20 0516 04/09/20 0621  AST 28  --  36 44*  --   ALT 16  --  21 26  --   ALKPHOS 97  --  107 122  --   BILITOT 0.9  --  0.7 1.0  --   PROT 5.7*  --  5.7* 5.9*  --   ALBUMIN 2.5*   < > 2.4* 2.4*  2.3* 2.1*   < > = values in this interval not displayed.   No results for input(s): LIPASE, AMYLASE in the last 168 hours. CBC: Recent Labs  Lab 04/05/20 0542 04/05/20 0542 04/06/20 0532 04/06/20 0532 04/07/20 0450 04/07/20 0450 04/07/20 1537 04/08/20 0516 04/09/20 0621  WBC 14.1*   < > 15.5*   < > 14.4*  --   --  11.0* 12.1*  NEUTROABS 10.2*   < > 11.4*   < > 10.0*  --   --  8.1* 7.8*  HGB 8.6*   < > 8.4*   < > 8.0*   < > 9.2* 9.2* 9.1*  HCT 27.8*    < > 26.4*   < > 25.8*   < > 29.0* 29.6* 28.5*  MCV 82.7  --  80.2  --  80.4  --   --  80.9 79.2*  PLT 244   < > 240   < > 251  --   --  322 353   < > = values in this interval not displayed.   Blood Culture    Component Value Date/Time   SDES  04/04/2020 1508    URINE, RANDOM Performed at [facility], [place] [address], [place], [place] [address]    SPECREQUEST  04/04/2020 1508    NONE Performed at [facility], [place] [address] [place], [place] [address]    CULT (A) 04/04/2020 6712  70,000 COLONIES/mL PROVIDENCIA STUARTII >=100,000 COLONIES/mL VANCOMYCIN RESISTANT ENTEROCOCCUS    REPTSTATUS 04/06/2020 FINAL 04/04/2020 1508    Cardiac Enzymes: No results for input(s): CKTOTAL, CKMB, CKMBINDEX, TROPONINI in the last 168 hours. CBG: Recent Labs  Lab 04/09/20 1135 04/09/20 1642 04/09/20 2226 04/10/20 0754 04/10/20 1140  GLUCAP 102* 113* 114* 105* 113*   Iron Studies: No results for input(s): IRON, TIBC, TRANSFERRIN, FERRITIN in the last 72 hours. @lablastinr3 @ Studies/Results: No results found. Medications: . sodium chloride Stopped (04/01/20 0906)  . piperacillin-tazobactam 2.25 g (04/10/20 0930)   . diltiazem  120 mg Oral Daily  . furosemide  60 mg Intravenous Once  . gabapentin  300 mg Oral QHS  . guaiFENesin  1,200 mg Oral BID  . heparin injection (subcutaneous)  5,000 Units Subcutaneous Q8H  . insulin aspart  0-9 Units Subcutaneous TID WC  . lactulose  10 g Oral BID  . levothyroxine  88 mcg Oral Q0600  . metoprolol tartrate  25 mg Oral BID  . pantoprazole  40 mg Oral Daily  . polyethylene glycol  17 g Oral Q8H  . rifaximin  550 mg Oral BID  . sodium bicarbonate  650 mg Oral TID  . sodium chloride flush  3 mL Intravenous Q12H    Dialysis Orders:  Assessment/Plan **AKI on CKD 4:  Baseline creatinine in the 2+ range of late - was at baseline on presentation but developed AKI mid hospitalization.  No hypotension.  The  only potential insult I see is contrast administration on 11/5 which temporally fits with the AKI.  She does have a UTI that is being treated but no evidence of AIN and I wouldn't change antibiotics.   I hoped her creatinine was peaking yesterday but she refuses labs today.  Based on ^d UOP I'm encouraged she may be recovering. Given her refusal of multiple interventions and diagnostics in addition to her poor baseline functional status she wouldn't be a candidate for dialysis.     **Multiorganism UTI:  Antibiotics per primary - I don't think they need to be changed as I don't suspect AIN.  **Anemia:  S/p transfusion - GI eval, no frank bleeding.  Further transfusion per primary.  Follows with hematology for leukocytosis, perhaps an ESA could be considered if Hb trending down but it's stable in the 9s at the moment.  **Metabolic acidosis: secondary to AKI, started po bicarb.    **Hyponatremia: ?SIADH.  Improving - developing edema so will d/c fluids.   Avoid hypotonic fluids; on 1257mL fluid restriction.   Refusing labs.  **Cirrhosis: new dx.   I explored GOC with her today - she was still noncommital; I think palliative care may be valuable - she has lots of comorbids and is high risk for repeated admission and global decline.   She is DNR.    Jannifer Hick MD 04/10/2020, 12:29 PM  [facility] Pager: [phone]

## 2020-04-10 NOTE — Progress Notes (Signed)
Patient refused labs being drawn even after multiple attempts to convince and educate why we needed the labs. Phlebotomy canceled labs d/t multiple attempts as well.

## 2020-04-10 NOTE — Progress Notes (Signed)
PROGRESS NOTE    [name]  Colon:[id number] DOB: [birth year] DOA: 03/28/2020 PCP: Patient, No Pcp Per    Chief Complaint  Patient presents with   Cough   Shortness of Breath    Brief Narrative:  The patient is a 74 year old African-American female with a past medical history significant for but not limited to diabetes mellitus type 2, diastolic CHF, hypertension, paroxysmal atrial fibrillation, chronic kidney disease stage IV with a baseline creatinine of 1.5-1.8 as well as other comorbidities who presented to the ED with shortness of breath, chest pain, fatigue over the last week.  She also reported a poor appetite.  She is found to have a hemoglobin of 3 on admission.  There are no reports of blood in her stool or dark or tarry stools.  Fecal occult test in the ED was positive.  GI was consulted and she was admitted to the hospitalist service.  She normally lives in SNF is nonambulatory and in the wheelchair.  She underwent EGD and colonoscopy without evidence of any bleeding.  She had multiple polyp removal from her colonoscopy today.  Her prep was poor.  GI recommending resuming Xarelto 04/03/20 and she received another dose of diuresis yesterday given her volume overload.  Her Xarelto is now resumed today but this evening on 04/04/2019 when she spiked a temperature and she does have a leukocytosis which is chronic but we will panculture to rule out infection.  She is complaining of some abdominal pain so we will get a CT of the abdomen pelvis.  Chest x-ray shows possible pneumonia so she was started on Unasyn and SLP was consulted given the location being the Right Lung Base.  **After further workup it appears the patient has a UTI with Providencia and Entercoccus Faecalis. We will escalate her antibiotics to IV meropenem and hold off of Vancomycin for now. Repeat CT scan showed persistent mild perirectal and presacral fat stranding of unclear etiology and presacral rectal findings  associate with cortical erosions, sclerosis and thinning of the S1 vertebral body. As well as the urinary bladder that was thickened circumferentially. Hemoglobin has dropped a little bit GI feels that the CT findings are in the setting of fecal impaction and due to stool inflammation from the fecal impaction. They feel that her anemia worsens and she will need a flex sig with enemas for prep prior to discharge to reevaluate the rectum after resolution of the fecal impaction. We will escalate antibiotics given her UTI.  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  Her renal function continues to worsen and we are doing further work-up will obtain a bladder scan, renal ultrasound and started the patient back on gentle IV fluids with normal saline at 25 mils per hour.  We are to place a Foley catheter for strict I's and O's however patient refused this.  We will continue monitor and will notify nephrology about her worsening renal function.  Nephrology feels that Contrast from last CT scan caused her AKI and GI has signed off and feel as if her Abdominal Pain is multifactorial. They are going to hold off on Flex Sigmoidoscopy.     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  Currently on heparin.  FOBT which was done was positive.  Patient status post  transfusion of 3 units packed red blood cells.  Hemoglobin on admission was 3.1 and currently at 9.2.  Patient with no overt bleeding.  Patient seen in consultation by GI underwent upper endoscopy 04/17/2020 without any significant findings or source of GI bleed.  Patient underwent rescheduled colonoscopy 04/03/2020 with poor prep and showed 2 to 5 mm polyps in the cecum, 3 5-9MM polyps in the transverse colon which were removed.  Biopsies consistent with tubular adenoma without high-grade dysplasia.  Due to poor prep GI recommending repeat colonoscopy in 6 months.  Patient currently on a soft diet.  Could likely resume Xarelto in the next 1 to 2 days if hemoglobin remained stable and improvement with renal function.  CT abdomen and pelvis was done which showed focal rectal wall thickening, acute cellular fluid as well as circumferential thickened bladder.  Leukocytosis trending down.  GI was reconsulted and patient given a soapsuds enema.  Per GI if continued drop in hemoglobin will need a flex sigmoidoscopy.  Currently holding off on flex sigmoidoscopy.  GI has signed off.  Follow H&H.  Transfusion threshold hemoglobin <7.  2.  Left lower quadrant abdominal pain CT abdomen and pelvis done without any acute findings.  Patient noted to have had a fever with a leukocytosis.  Fever likely secondary from UTI.  CT with focal rectal wall thickening of acute cellular fluid as well as circumferentially thickened bladder.  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  On empiric IV antibiotics.  Clinical improvement.  Follow.  3.  Liver cirrhosis New diagnosis.  MRI of abdomen was degraded exam however no concerning lesions noted repeat MRI recommended which can be done in the outpatient setting.  Continue Xifaxan, lactulose.  Outpatient follow-up with GI.  4.  Acute metabolic encephalopathy Likely secondary to symptomatic profound anemia.  Ammonia levels within normal limits.  Encephalopathy resolved  and currently at baseline.  5.  Acute renal failure on chronic kidney disease stage IV Baseline creatinine approximately 2.  Patient seen in consultation by nephrology who do not feel patient has any evidence of AIN and do not recommend any change in antibiotics.  Patient could likely have developed acute insult on chronic kidney disease stage IV from contrast nephropathy from CT done 04/04/2020.  Last creatinine 4.36(04/09/2020) from 4.28 from 4.07 from 3.76 from 3.62 from 2.95 from 1.87 from 1.41 from 2.30 on admission.  Nephrology does not feel patient is a good long-term dialysis candidate but  discussed with patient for temporary dialysis if needed.  Patient with a urine output not properly recorded.  Patient developing some lower extremity edema.  IV fluids have been saline locked per nephrology.  Gabapentin dose decreased to 300 mg nightly.  Patient refused labs today.  Repeat labs tomorrow.  Per nephrology.  6.  Providencia stuartii and VRE UTI Continue IV Zosyn and likely discontinue after tomorrow's doses.  Supportive care.  Follow.  7.  Metabolic acidosis Likely secondary to acute kidney injury.  Continue bicarb tablets.  Follow.   8.  Hyponatremia ??  SIADH.  Patient on 1200 cc fluid restriction per nephrology.  Sodium level improving.  Sodium currently at 133.  Patient with some lower extremity edema.  IV fluids saline locked.  Patient refused labs this morning.  Repeat labs tomorrow.    9.  Hyperkalemia Resolved with Lokelma.  10.  Acute on chronic diastolic CHF Patient noted during the hospitalization early on to be volume overloaded on examination with lower extremity edema.  BNP elevated at 330 however patient with chronic kidney disease.  Pleural effusions noted on chest x-ray.  Patient status post intermittent diuresis with clinical improvement.  Patient with no respiratory symptoms.  Patient with some lower extremity edema.  Follow.    11.  Hypothyroidism Synthroid.  12.   Well-controlled diabetes mellitus type 2 Hemoglobin A1c 5.6.  CBG of 105 this morning.  Sliding scale insulin.  13.  Paroxysmal atrial fibrillation Continue beta-blocker for rate control.  Patient refused labs this morning.  Repeat labs tomorrow.  If hemoglobin stable tomorrow could resume home regimen Xarelto.   13. Sacral ulcer, POA Pressure Injury 04/07/2020 Sacrum Medial Stage III -  Full thickness tissue loss. Subcutaneous fat may be visible but bone, tendon or muscle are NOT exposed. (Active)  04/25/2020 1149  Location: Sacrum  Location Orientation: Medial  Staging: Stage III -  Full thickness tissue loss. Subcutaneous fat may be visible but bone, tendon or muscle are NOT exposed.  Wound Description (Comments):   Present on Admission: Yes         DVT prophylaxis: SCDs>>> Heparin Code Status: DNR Family Communication: Updated patient.  No family at bedside. Disposition:   Status is: Inpatient    Dispo: The patient is from: SNF              Anticipated d/c is to: SNF              Anticipated d/c date is: To be determined.  When cleared by nephrology.              Patient currently with acute on chronic kidney disease stage IV, being followed by nephrology, UTI on IV antibiotics, anemia status post transfusion.  Not stable for discharge.       Consultants:   Nephrology: Dr. Johnney Ou 04/07/2020  Gastroenterology: Dr. Alessandra Bevels 03/30/2020  Procedures:   Transfuse 3 units packed red blood cells 03/26/2020--04/28/2020  Colonoscopy 04/04/2020--per Dr. Alessandra Bevels  Upper endoscopy 04/06/2020--by Dr. Alessandra Bevels  CT abdomen and pelvis 04/04/2020, 03/04/2020  CT pelvis 03/21/2020  Chest x-ray 03/14/2020, 03/30/2020, 04/03/2020  MRI liver 04/01/2020  Renal ultrasound 04/06/2020  2D echo 03/30/2020  Antimicrobials:  Anti-infectives (From admission, onward)   Start     Dose/Rate Route Frequency Ordered Stop   04/06/20 1430  piperacillin-tazobactam (ZOSYN) IVPB 2.25 g         2.25 g 100 mL/hr over 30 Minutes Intravenous Every 6 hours 04/06/20 1320 04/11/20 2359   04/05/20 1930  meropenem (MERREM) 1 g in sodium chloride 0.9 % 100 mL IVPB  Status:  Discontinued        1 g 200 mL/hr over 30 Minutes Intravenous Every 12 hours 04/05/20 1832 04/06/20 1320   04/04/20 1000  Ampicillin-Sulbactam (UNASYN) 3 g in sodium chloride 0.9 % 100 mL IVPB  Status:  Discontinued        3 g 200 mL/hr over 30 Minutes Intravenous Every 12 hours 04/04/20 0851 04/05/20 1829   03/30/20 1400  rifaximin (XIFAXAN) tablet 550 mg  550 mg Oral 2 times daily 03/30/20 1136         Subjective: Patient sleeping but arousable.  Denies chest pain or shortness of breath.  Feeling better.  Still with some complaints of left lower quadrant abdominal pain with bowel movements, and currently managed on current pain regimen.  Improvement with fatigue.  Stated refused labs today as arm was hurting from multiple blood draws.   Objective: Vitals:   04/09/20 1327 04/09/20 2100 04/10/20 0355 04/10/20 0500  BP: (!) 167/51 (!) 164/69 (!) 148/74   Pulse: 80 68 73   Resp: 20  18   Temp: 98.2 F (36.8 C) 97.7 F (36.5 C) 97.7 F (36.5 C)   TempSrc: Oral Oral Axillary   SpO2: 100% 97% 100%   Weight:    66.7 kg  Height:        Intake/Output Summary (Last 24 hours) at 04/10/2020 1159 Last data filed at 04/10/2020 1100 Gross per 24 hour  Intake 483 ml  Output 700 ml  Net -217 ml   Filed Weights   04/08/20 0439 04/09/20 0437 04/10/20 0500  Weight: 66.1 kg 67.4 kg 66.7 kg    Examination:  General exam: NAD Respiratory system: Lungs clear to auscultation bilaterally.  No wheezes, no crackles, no rhonchi.  Normal respiratory effort.  Cardiovascular system: RRR no murmurs rubs or gallops.  No JVD.  1-2+ bilateral lower extremity edema.  Gastrointestinal system: Abdomen is soft, nondistended, positive bowel sounds, some decreased tenderness to palpation left lower quadrant.  No rebound.  No  guarding.  Central nervous system: Alert and oriented. No focal neurological deficits. Extremities: Symmetric 5 x 5 power. Skin: No rashes, lesions or ulcers Psychiatry: Judgement and insight appear normal. Mood & affect appropriate.     Data Reviewed: I have personally reviewed following labs and imaging studies  CBC: Recent Labs  Lab 04/05/20 0542 04/05/20 0542 04/06/20 0532 04/07/20 0450 04/07/20 1537 04/08/20 0516 04/09/20 0621  WBC 14.1*  --  15.5* 14.4*  --  11.0* 12.1*  NEUTROABS 10.2*  --  11.4* 10.0*  --  8.1* 7.8*  HGB 8.6*   < > 8.4* 8.0* 9.2* 9.2* 9.1*  HCT 27.8*   < > 26.4* 25.8* 29.0* 29.6* 28.5*  MCV 82.7  --  80.2 80.4  --  80.9 79.2*  PLT 244  --  240 251  --  322 353   < > = values in this interval not displayed.    Basic Metabolic Panel: Recent Labs  Lab 04/04/20 0431 04/04/20 0431 04/05/20 0542 04/05/20 0542 04/06/20 0532 04/07/20 0450 04/07/20 1537 04/08/20 0516 04/09/20 0621  NA 136   < > 132*   < > 133* 126* 128* 128*   128* 133*  K 4.3   < > 4.0   < > 4.3 4.1 4.4 4.3   4.3 3.9  CL 104   < > 102   < > 102 98 98 100   100 104  CO2 19*   < > 19*   < > 20* 18* 19* 16*   17* 17*  GLUCOSE 141*   < > 164*   < > 116* 98 101* 84   84 100*  BUN 35*   < > 34*   < > 42* 43* 50* 51*   50* 50*  CREATININE 1.41*   < > 1.87*   < > 2.95* 3.62* 3.76* 4.28*   4.07* 4.36*  CALCIUM 8.3*   < > 8.0*   < >  7.7* 7.5* 7.7* 8.0*   8.0* 7.8*  MG 1.6*  --  2.3  --  2.2 2.0  --  2.1  --   PHOS 3.4   < > 4.1  --  5.1* 5.4*  --  6.9*   7.1* 7.0*   < > = values in this interval not displayed.    GFR: Estimated Creatinine Clearance: 9.7 mL/min (A) (by C-G formula based on SCr of 4.36 mg/dL (H)).  Liver Function Tests: Recent Labs  Lab 04/04/20 0431 04/04/20 0431 04/05/20 0542 04/06/20 0532 04/07/20 0450 04/08/20 0516 04/09/20 0621  AST 18  --  17 28 36 44*  --   ALT 11  --  11 16 21 26   --   ALKPHOS 80  --  78 97 107 122  --   BILITOT 0.7  --  0.6 0.9 0.7  1.0  --   PROT 6.1*  --  6.1* 5.7* 5.7* 5.9*  --   ALBUMIN 2.8*   < > 2.7* 2.5* 2.4* 2.4*   2.3* 2.1*   < > = values in this interval not displayed.    CBG: Recent Labs  Lab 04/09/20 1135 04/09/20 1642 04/09/20 2226 04/10/20 0754 04/10/20 1140  GLUCAP 102* 113* 114* 105* 113*     Recent Results (from the past 240 hour(s))  Culture, blood (Routine X 2) w Reflex to ID Panel     Status: None   Collection Time: 04/04/20  6:25 AM   Specimen: BLOOD  Result Value Ref Range Status   Specimen Description   Final    BLOOD RIGHT ANTECUBITAL Performed at [facility] [address] [place], [place] [address]    Special Requests   Final    BOTTLES DRAWN AEROBIC ONLY Blood Culture adequate volume Performed at [facility] [address] [place], [place] [address]    Culture   Final    NO GROWTH 5 DAYS Performed at [facility], [place] [address] [place], [place] [address]    Report Status 04/09/2020 FINAL  Final  Culture, blood (Routine X 2) w Reflex to ID Panel     Status: None   Collection Time: 04/04/20  6:25 AM   Specimen: BLOOD RIGHT HAND  Result Value Ref Range Status   Specimen Description   Final    BLOOD RIGHT HAND Performed at [facility] [address] [place], [place] [address]    Special Requests   Final    BOTTLES DRAWN AEROBIC AND ANAEROBIC Blood Culture adequate volume Performed at [facility] [address], [place], [place] [address]    Culture   Final    NO GROWTH 5 DAYS Performed at [facility], [place] [address] [place], [place] [address]    Report Status 04/09/2020 FINAL  Final  Culture, Urine     Status: Abnormal   Collection Time: 04/04/20  3:08 PM   Specimen: Urine, Random  Result Value Ref Range Status   Specimen Description   Final    URINE, RANDOM Performed at [facility] [address] [place], [place] [address]    Special  Requests   Final    NONE Performed at [facility], [place] [address], [place],  [address]    Culture (A)  Final    70,000 COLONIES/mL PROVIDENCIA STUARTII >=100,000 COLONIES/mL VANCOMYCIN RESISTANT ENTEROCOCCUS    Report Status 04/06/2020 FINAL  Final   Organism  ID, Bacteria PROVIDENCIA STUARTII (A)  Final   Organism ID, Bacteria VANCOMYCIN RESISTANT ENTEROCOCCUS (A)  Final      Susceptibility   Providencia stuartii - MIC*    AMPICILLIN >=32 RESISTANT Resistant     CEFAZOLIN >=64 RESISTANT Resistant     CEFEPIME 0.25 SENSITIVE Sensitive     CEFTRIAXONE >=64 RESISTANT Resistant     CIPROFLOXACIN >=4 RESISTANT Resistant     GENTAMICIN RESISTANT Resistant     IMIPENEM 2 SENSITIVE Sensitive     NITROFURANTOIN 128 RESISTANT Resistant     TRIMETH/SULFA 160 RESISTANT Resistant     AMPICILLIN/SULBACTAM >=32 RESISTANT Resistant     PIP/TAZO <=4 SENSITIVE Sensitive     * 70,000 COLONIES/mL PROVIDENCIA STUARTII   Vancomycin resistant enterococcus - MIC*    AMPICILLIN <=2 SENSITIVE Sensitive     NITROFURANTOIN <=16 SENSITIVE Sensitive     VANCOMYCIN >=32 RESISTANT Resistant     LINEZOLID 2 SENSITIVE Sensitive     * >=100,000 COLONIES/mL VANCOMYCIN RESISTANT ENTEROCOCCUS         Radiology Studies: No results found.      Scheduled Meds:  diltiazem  120 mg Oral Daily   gabapentin  300 mg Oral QHS   guaiFENesin  1,200 mg Oral BID   heparin injection (subcutaneous)  5,000 Units Subcutaneous Q8H   insulin aspart  0-9 Units Subcutaneous TID WC   lactulose  10 g Oral BID   levothyroxine  88 mcg Oral Q0600   metoprolol tartrate  25 mg Oral BID   pantoprazole  40 mg Oral Daily   polyethylene glycol  17 g Oral Q8H   rifaximin  550 mg Oral BID   sodium bicarbonate  650 mg Oral TID   sodium chloride flush  3 mL Intravenous Q12H   Continuous Infusions:  sodium chloride Stopped (04/01/20 0906)   piperacillin-tazobactam 2.25 g (04/10/20 0930)      LOS: 12 days    Time spent: 35 mins    Irine Seal, MD [facility]   To contact the attending provider between 7A-7P or the covering provider during after hours 7P-7A, please log into the web site [email or web address] and access using universal [place] password for that web site. If you do not have the password, please call the hospital operator.  04/10/2020, 11:59 AM

## 2020-04-10 NOTE — Progress Notes (Signed)
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 Pt presenting to ED with SOB, chest pain, and fatigue.  Found to have a hgb of 3.1 and has received 4 units of PRBC.  Pt had EGD that was significant.  Attempted colonscopy but with poor prep and GI recommending capsule endoscopy    PT Comments    Assisted pt from supine to seated EOB required Total Assist pt < 5%.  VERY WEAK.  Required Max Assist to maintain static sitting balance.  Unable to sit unsupported.  Assisted from bed to recliner SPS "Bear Hug" as pt was unable to support her weight.  Pt 0%.  VERY WEAK.  Pt stated she was goinf to therapy at her SNF and standing in the parallel Bars with assist.  Positioned in recliner to comfort and applied MAXI MOVE pad under.  Reported to NT to use LIFT to assist back to bed.  Pt plans to return to her SNF.    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  "I feel so weak".  Transfers Overall transfer level: Needs assistance   Transfers: Stand Pivot Transfers           General transfer comment: "bear Hug" 1/4 turn from elevated bed to recliner pt < 25% and unable to support her own body weight.  Ambulation/Gait                  Stairs             Wheelchair Mobility    Modified Rankin (Stroke Patients Only)       Balance                                            Cognition Arousal/Alertness: Awake/alert Behavior During Therapy: WFL for tasks assessed/performed Overall Cognitive Status: Within Functional Limits for tasks assessed                                 General Comments: AxO x 3 aware and stated "I'm mean" but was cooperative forr [place]      Exercises      General Comments        Pertinent Vitals/Pain Pain Assessment: No/denies pain    Home Living                      Prior Function            PT Goals (current goals can now be found in the care plan section) Progress  towards PT goals: Progressing toward goals    Frequency    Min 2X/week      PT Plan Current plan remains appropriate    Co-evaluation              AM-PAC PT "6 Clicks" Mobility   Outcome Measure  Help needed turning from your back to your side while in a flat bed without using bedrails?: Total Help needed moving from lying on your back to sitting on the side of a flat bed without using bedrails?: Total Help needed moving to and from a bed to a chair (including a wheelchair)?: Total Help needed standing up from a chair using your arms (e.g., wheelchair or bedside chair)?: Total Help needed to walk in hospital room?: Total Help needed climbing 3-5 steps with a railing? : Total 6 Click Score: 6    End of Session Equipment Utilized During Treatment: Gait belt Activity Tolerance: Patient limited by fatigue Patient left: in chair;with call bell/phone within reach Nurse Communication: Need for lift equipment;Mobility status PT Visit Diagnosis: Muscle weakness (generalized) (M62.81);Unsteadiness on feet (R26.81)     Time: 7357-8978 PT Time Calculation (min) (ACUTE ONLY): 21 min  Charges:  $Therapeutic Activity: 8-22 mins                      Rica Koyanagi  PTA Acute  Rehabilitation Services Pager      [phone] Office      [phone]

## 2020-04-10 NOTE — Care Management Important Message (Signed)
Important Message  Patient Details IM Letter given to the Patient Name: Sarah Colon MRN: [id number] Date of Birth: 05-10-1946   Medicare Important Message Given:  Yes     Kerin Salen 04/10/2020, 10:16 AM

## 2020-04-11 DIAGNOSIS — I5033 Acute on chronic diastolic (congestive) heart failure: Secondary | ICD-10-CM | POA: Diagnosis not present

## 2020-04-11 DIAGNOSIS — D649 Anemia, unspecified: Secondary | ICD-10-CM | POA: Diagnosis not present

## 2020-04-11 DIAGNOSIS — N179 Acute kidney failure, unspecified: Secondary | ICD-10-CM | POA: Diagnosis not present

## 2020-04-11 DIAGNOSIS — E039 Hypothyroidism, unspecified: Secondary | ICD-10-CM | POA: Diagnosis not present

## 2020-04-11 LAB — RENAL FUNCTION PANEL
Albumin: 2.2 g/dL — ABNORMAL LOW (ref 3.5–5.0)
Anion gap: 13 (ref 5–15)
BUN: 46 mg/dL — ABNORMAL HIGH (ref 8–23)
CO2: 15 mmol/L — ABNORMAL LOW (ref 22–32)
Calcium: 8.1 mg/dL — ABNORMAL LOW (ref 8.9–10.3)
Chloride: 110 mmol/L (ref 98–111)
Creatinine, Ser: 4.5 mg/dL — ABNORMAL HIGH (ref 0.44–1.00)
GFR, Estimated: 10 mL/min — ABNORMAL LOW (ref >60)
Glucose, Bld: 104 mg/dL — ABNORMAL HIGH (ref 70–99)
Phosphorus: 6.2 mg/dL — ABNORMAL HIGH (ref 2.5–4.6)
Potassium: 3.2 mmol/L — ABNORMAL LOW (ref 3.5–5.1)
Sodium: 138 mmol/L (ref 135–145)

## 2020-04-11 LAB — CBC
HCT: 28.6 % — ABNORMAL LOW (ref 36.0–46.0)
Hemoglobin: 9.1 g/dL — ABNORMAL LOW (ref 12.0–15.0)
MCH: 24.7 pg — ABNORMAL LOW (ref 26.0–34.0)
MCHC: 31.8 g/dL (ref 30.0–36.0)
MCV: 77.7 fL — ABNORMAL LOW (ref 80.0–100.0)
Platelets: 472 10*3/uL — ABNORMAL HIGH (ref 150–400)
RBC: 3.68 MIL/uL — ABNORMAL LOW (ref 3.87–5.11)
RDW: 28.8 % — ABNORMAL HIGH (ref 11.5–15.5)
WBC: 14.7 10*3/uL — ABNORMAL HIGH (ref 4.0–10.5)
nRBC: 0 % (ref 0.0–0.2)

## 2020-04-11 LAB — GLUCOSE, CAPILLARY
Glucose-Capillary: 91 mg/dL (ref 70–99)
Glucose-Capillary: 109 mg/dL — ABNORMAL HIGH (ref 70–99)
Glucose-Capillary: 106 mg/dL — ABNORMAL HIGH (ref 70–99)
Glucose-Capillary: 119 mg/dL — ABNORMAL HIGH (ref 70–99)

## 2020-04-11 MED ORDER — SODIUM BICARBONATE 650 MG PO TABS
1300.0000 mg | ORAL_TABLET | Freq: Three times a day (TID) | ORAL | Status: DC
  Administered 2020-04-11 – 2020-04-25 (×41): 1300 mg via ORAL
  Filled 2020-04-11 (×43): qty 2

## 2020-04-11 MED ORDER — POTASSIUM CHLORIDE CRYS ER 20 MEQ PO TBCR
20.0000 meq | EXTENDED_RELEASE_TABLET | Freq: Once | ORAL | Status: AC
  Administered 2020-04-11: 20 meq via ORAL
  Filled 2020-04-11: qty 1

## 2020-04-11 NOTE — Progress Notes (Addendum)
PROGRESS NOTE    Sarah Colon  FHL:[id number] DOB: July 24, 1945 DOA: 03/09/2020 PCP: Patient, No Pcp Per    Chief Complaint  Patient presents with  . Cough  . Shortness of Breath    Brief Narrative:  The patient is a 74 year old African-American female with a past medical history significant for but not limited to diabetes mellitus type 2, diastolic CHF, hypertension, paroxysmal atrial fibrillation, chronic kidney disease stage IV with a baseline creatinine of 1.5-1.8 as well as other comorbidities who presented to the ED with shortness of breath, chest pain, fatigue over the last week.  She also reported a poor appetite.  She is found to have a hemoglobin of 3 on admission.  There are no reports of blood in her stool or dark or tarry stools.  Fecal occult test in the ED was positive.  GI was consulted and she was admitted to the hospitalist service.  She normally lives in SNF is nonambulatory and in the wheelchair.  She underwent EGD and colonoscopy without evidence of any bleeding.  She had multiple polyp removal from her colonoscopy today.  Her prep was poor.  GI recommending resuming Xarelto 04/03/20 and she received another dose of diuresis yesterday given her volume overload.  Her Xarelto is now resumed today but this evening on 04/04/2019 when she spiked a temperature and she does have a leukocytosis which is chronic but we will panculture to rule out infection.  She is complaining of some abdominal pain so we will get a CT of the abdomen pelvis.  Chest x-ray shows possible pneumonia so she was started on Unasyn and SLP was consulted given the location being the Right Lung Base.  **After further workup it appears the patient has a UTI with Providencia and Entercoccus Faecalis. We will escalate her antibiotics to IV meropenem and hold off of Vancomycin for now. Repeat CT scan showed persistent mild perirectal and presacral fat stranding of unclear etiology and presacral rectal findings  associate with cortical erosions, sclerosis and thinning of the S1 vertebral body. As well as the urinary bladder that was thickened circumferentially. Hemoglobin has dropped a little bit GI feels that the CT findings are in the setting of fecal impaction and due to stool inflammation from the fecal impaction. They feel that her anemia worsens and she will need a flex sig with enemas for prep prior to discharge to reevaluate the rectum after resolution of the fecal impaction. We will escalate antibiotics given her UTI.  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  Her renal function continues to worsen and we are doing further work-up will obtain a bladder scan, renal ultrasound and started the patient back on gentle IV fluids with normal saline at 25 mils per hour.  We are to place a Foley catheter for strict I's and O's however patient refused this.  We will continue monitor and will notify nephrology about her worsening renal function.  Nephrology feels that Contrast from last CT scan caused her AKI and GI has signed off and feel as if her Abdominal Pain is multifactorial. They are going to hold off on Flex Sigmoidoscopy.     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  Currently on heparin.  FOBT which was done was positive.  Patient status post  transfusion of 3 units packed red blood cells.  Hemoglobin on admission was 3.1 and currently at 9.1.  Patient with no overt bleeding.  Patient seen in consultation by GI underwent upper endoscopy 04/06/2020 without any significant findings or source of GI bleed.  Patient underwent rescheduled colonoscopy 04/07/2020 with poor prep and showed 2 to 5 mm polyps in the cecum, 3 5-9MM polyps in the transverse colon which were removed.  Biopsies consistent with tubular adenoma without high-grade dysplasia.  Due to poor prep GI recommending repeat colonoscopy in 6 months.  Patient currently on a soft diet.  Could likely resume Xarelto in the next 1 to 2 days if hemoglobin remained stable and improvement with renal function.  CT abdomen and pelvis was done which showed focal rectal wall thickening, acute cellular fluid as well as circumferential thickened bladder.  Leukocytosis fluctuating.  GI was reconsulted and patient given a soapsuds enema due to concerns for fecal impaction..  Per GI if continued drop in hemoglobin will need a flex sigmoidoscopy.  Currently holding off on flex sigmoidoscopy.  GI has signed off.  Follow H&H.  Transfusion threshold hemoglobin <7.  2.  Left lower quadrant abdominal pain/fecal impaction CT abdomen and pelvis done without any acute findings.  Patient noted to have had a fever with a leukocytosis.  Fever likely secondary from UTI.  CT with focal rectal wall thickening of acute cellular fluid as well as circumferentially thickened bladder.  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  On empiric IV antibiotics.  Patient was seen in consultation by GI who felt patient had a component of fecal impaction causing her abdominal pain.  Soapsuds enema was ordered early on during the hospitalization.  Patient with complaints of diffuse abdominal pain and stated has not had a bowel movement in the several days.  We will give a soapsuds enema x1.  Supportive care.  Follow.   3.  Liver  cirrhosis New diagnosis.  MRI of abdomen was degraded exam however no concerning lesions noted repeat MRI recommended which can be done in the outpatient setting.  Continue Xifaxan, lactulose.  Outpatient follow-up with GI.  4.  Acute metabolic encephalopathy Likely secondary to symptomatic profound anemia.  Ammonia levels within normal limits.  Encephalopathy resolved and currently at baseline.  5.  Acute renal failure on chronic kidney disease stage IV Baseline creatinine approximately 2.  Patient seen in consultation by nephrology who do not feel patient has any evidence of AIN and do not recommend any change in antibiotics.  Patient could likely have developed acute insult on chronic kidney disease stage IV from contrast nephropathy from CT done 04/04/2020.  Creatinine currently at 4.50 from 4.36(04/09/2020) from 4.28 from 4.07 from 3.76 from 3.62 from 2.95 from 1.87 from 1.41 from 2.30 on admission.  Nephrology does not feel patient is a good long-term dialysis candidate but  discussed with patient for temporary dialysis if needed.  Patient with a urine output of 625 cc over the past 24 hours.  Patient developing some lower extremity edema.  IV fluids have been saline locked per nephrology.  Gabapentin dose decreased to 300 mg nightly.  Repeat labs tomorrow.  Per nephrology.  6.  Providencia stuartii and VRE UTI Continue IV Zosyn and likely discontinue after completion of today's doses.  Supportive care.  Follow.   7.  Metabolic acidosis Likely secondary to acute kidney injury.  Continue bicarb tablets.  Per nephrology.  Follow.   8.  Hyponatremia ??  SIADH.  Patient on 1200 cc fluid restriction per nephrology.  Sodium level improving.  Sodium currently at 138.  Patient with some lower extremity edema.  IV fluids have been saline lock.  Follow.   9.  Hyperkalemia Resolved with Lokelma.  10.  Acute on chronic diastolic CHF Patient noted during the hospitalization early on to be volume  overloaded on examination with lower extremity edema.  BNP elevated at 330 however patient with chronic kidney disease.  Pleural effusions noted on chest x-ray.  Patient status post intermittent diuresis with clinical improvement.  Patient with no respiratory symptoms.  Patient with some lower extremity edema.  Follow.    11.  Hypothyroidism Continue Synthroid.  12.  Well-controlled diabetes mellitus type 2 Hemoglobin A1c 5.6.  CBG of 91 this morning.  Sliding scale insulin.  13.  Paroxysmal atrial fibrillation Continue beta-blocker for rate control.  Hemoglobin currently stable at 9.1.  Xarelto currently on hold.  14.  Hypokalemia K. Dur 20 mEq p.o. x1.  15. Sacral ulcer, POA Pressure Injury 04/14/2020 Sacrum Medial Stage III -  Full thickness tissue loss. Subcutaneous fat may be visible but bone, tendon or muscle are NOT exposed. (Active)  04/11/2020 1149  Location: Sacrum  Location Orientation: Medial  Staging: Stage III -  Full thickness tissue loss. Subcutaneous fat may be visible but bone, tendon or muscle are NOT exposed.  Wound Description (Comments):   Present on Admission: Yes         DVT prophylaxis: SCDs>>> Heparin Code Status: DNR Family Communication: Updated patient.  Updated daughter Carlus Pavlov on the telephone.  Disposition:   Status is: Inpatient    Dispo: The patient is from: SNF              Anticipated d/c is to: SNF              Anticipated d/c date is: To be determined.  When cleared by nephrology.              Patient currently with acute on chronic kidney disease stage IV, being followed by nephrology, UTI on IV antibiotics, anemia status post transfusion.  Not stable for discharge.       Consultants:   Nephrology: Dr. Johnney Ou 04/07/2020  Gastroenterology: Dr. Alessandra Bevels 03/30/2020  Procedures:   Transfuse 3 units packed red blood cells 03/03/2020--04/10/2020  Colonoscopy 04/19/2020--per Dr. Alessandra Bevels  Upper endoscopy 04/12/2020--by Dr.  Alessandra Bevels  CT abdomen and pelvis 04/04/2020, 03/12/2020  CT pelvis 03/01/2020  Chest x-ray 03/25/2020, 03/30/2020, 04/03/2020  MRI liver 04/01/2020  Renal ultrasound 04/06/2020  2D echo 03/30/2020  Antimicrobials:  Anti-infectives (From admission, onward)   Start     Dose/Rate Route Frequency Ordered Stop   04/06/20 1430  piperacillin-tazobactam (ZOSYN) IVPB 2.25 g        2.25 g 100 mL/hr over 30 Minutes Intravenous Every 6 hours 04/06/20 1320 04/11/20 2359   04/05/20 1930  meropenem (MERREM) 1 g in sodium chloride 0.9 % 100 mL IVPB  Status:  Discontinued        1 g 200  mL/hr over 30 Minutes Intravenous Every 12 hours 04/05/20 1832 04/06/20 1320   04/04/20 1000  Ampicillin-Sulbactam (UNASYN) 3 g in sodium chloride 0.9 % 100 mL IVPB  Status:  Discontinued        3 g 200 mL/hr over 30 Minutes Intravenous Every 12 hours 04/04/20 0851 04/05/20 1829   03/30/20 1400  rifaximin (XIFAXAN) tablet 550 mg        550 mg Oral 2 times daily 03/30/20 1136         Subjective: Patient getting changed.  Patient denies any chest pain.  No shortness of breath.  Patient complains of abdominal distention and diffuse abdominal pain.  Patient stated has not had a bowel movement in several days.    Objective: Vitals:   04/10/20 1250 04/10/20 2109 04/11/20 0300 04/11/20 0624  BP: (!) 156/84 (!) 155/101  (!) 149/75  Pulse: 81 87  72  Resp: 18 18  18   Temp: 98.4 F (36.9 C) 97.7 F (36.5 C)  99.3 F (37.4 C)  TempSrc: Oral   Oral  SpO2: 100% 100%  97%  Weight:   65.9 kg   Height:        Intake/Output Summary (Last 24 hours) at 04/11/2020 1130 Last data filed at 04/11/2020 0309 Gross per 24 hour  Intake 100 ml  Output 625 ml  Net -525 ml   Filed Weights   04/09/20 0437 04/10/20 0500 04/11/20 0300  Weight: 67.4 kg 66.7 kg 65.9 kg    Examination:  General exam: NAD Respiratory system: CTAB.  No wheezes, no crackles, no rhonchi.  Normal respiratory effort.  Cardiovascular system:  Regular rate rhythm no murmurs rubs or gallops.  No JVD.  1-2+ bilateral lower extremity edema.  Gastrointestinal system: Abdomen is soft, diffusely tender, distended, positive bowel sounds.  No rebound.  No guarding. Central nervous system: Alert and oriented. No focal neurological deficits. Extremities: Symmetric 5 x 5 power. Skin: No rashes, lesions or ulcers Psychiatry: Judgement and insight appear normal. Mood & affect appropriate.     Data Reviewed: I have personally reviewed following labs and imaging studies  CBC: Recent Labs  Lab 04/05/20 0542 04/05/20 0542 04/06/20 0532 04/06/20 0532 04/07/20 0450 04/07/20 1537 04/08/20 0516 04/09/20 0621 04/11/20 0427  WBC 14.1*   < > 15.5*  --  14.4*  --  11.0* 12.1* 14.7*  NEUTROABS 10.2*  --  11.4*  --  10.0*  --  8.1* 7.8*  --   HGB 8.6*   < > 8.4*   < > 8.0* 9.2* 9.2* 9.1* 9.1*  HCT 27.8*   < > 26.4*   < > 25.8* 29.0* 29.6* 28.5* 28.6*  MCV 82.7   < > 80.2  --  80.4  --  80.9 79.2* 77.7*  PLT 244   < > 240  --  251  --  322 353 472*   < > = values in this interval not displayed.    Basic Metabolic Panel: Recent Labs  Lab 04/05/20 0542 04/05/20 0542 04/06/20 0532 04/06/20 0532 04/07/20 0450 04/07/20 1537 04/08/20 0516 04/09/20 0621 04/11/20 0427  NA 132*   < > 133*   < > 126* 128* 128*  128* 133* 138  K 4.0   < > 4.3   < > 4.1 4.4 4.3  4.3 3.9 3.2*  CL 102   < > 102   < > 98 98 100  100 104 110  CO2 19*   < > 20*   < >  18* 19* 16*  17* 17* 15*  GLUCOSE 164*   < > 116*   < > 98 101* 84  84 100* 104*  BUN 34*   < > 42*   < > 43* 50* 51*  50* 50* 46*  CREATININE 1.87*   < > 2.95*   < > 3.62* 3.76* 4.28*  4.07* 4.36* 4.50*  CALCIUM 8.0*   < > 7.7*   < > 7.5* 7.7* 8.0*  8.0* 7.8* 8.1*  MG 2.3  --  2.2  --  2.0  --  2.1  --   --   PHOS 4.1   < > 5.1*  --  5.4*  --  6.9*  7.1* 7.0* 6.2*   < > = values in this interval not displayed.    GFR: Estimated Creatinine Clearance: 9.4 mL/min (A) (by C-G formula based  on SCr of 4.5 mg/dL (H)).  Liver Function Tests: Recent Labs  Lab 04/05/20 0542 04/05/20 0542 04/06/20 0532 04/07/20 0450 04/08/20 0516 04/09/20 0621 04/11/20 0427  AST 17  --  28 36 44*  --   --   ALT 11  --  16 21 26   --   --   ALKPHOS 78  --  97 107 122  --   --   BILITOT 0.6  --  0.9 0.7 1.0  --   --   PROT 6.1*  --  5.7* 5.7* 5.9*  --   --   ALBUMIN 2.7*   < > 2.5* 2.4* 2.4*  2.3* 2.1* 2.2*   < > = values in this interval not displayed.    CBG: Recent Labs  Lab 04/10/20 0754 04/10/20 1140 04/10/20 1812 04/10/20 2048 04/11/20 0732  GLUCAP 105* 113* 108* 105* 91     Recent Results (from the past 240 hour(s))  Culture, blood (Routine X 2) w Reflex to ID Panel     Status: None   Collection Time: 04/04/20  6:25 AM   Specimen: BLOOD  Result Value Ref Range Status   Specimen Description   Final    BLOOD RIGHT ANTECUBITAL Performed at [facility], [place] [address] [place], [place] [address]    Special Requests   Final    BOTTLES DRAWN AEROBIC ONLY Blood Culture adequate volume Performed at [place] [address], [place], [place] [address]    Culture   Final    NO GROWTH 5 DAYS Performed at [facility], [place] [address] [place], [place] [address]    Report Status 04/09/2020 FINAL  Final  Culture, blood (Routine X 2) w Reflex to ID Panel     Status: None   Collection Time: 04/04/20  6:25 AM   Specimen: BLOOD RIGHT HAND  Result Value Ref Range Status   Specimen Description   Final    BLOOD RIGHT HAND Performed at [place] [address], [place], [place] [address]    Special Requests   Final    BOTTLES DRAWN AEROBIC AND ANAEROBIC Blood Culture adequate volume Performed at [place] [address], [place], [place] [address]    Culture   Final    NO GROWTH 5 DAYS Performed at [facility], [place] [address], [place],  [address]    Report Status  04/09/2020 FINAL  Final  Culture, Urine     Status: Abnormal   Collection Time: 04/04/20  3:08 PM   Specimen: Urine, Random  Result  Value Ref Range Status   Specimen Description   Final    URINE, RANDOM Performed at [place] [address], [place], [place] [address]    Special Requests   Final    NONE Performed at [facility], [place] [address], [place], [place] [address]    Culture (A)  Final    70,000 COLONIES/mL PROVIDENCIA STUARTII >=100,000 COLONIES/mL VANCOMYCIN RESISTANT ENTEROCOCCUS    Report Status 04/06/2020 FINAL  Final   Organism ID, Bacteria PROVIDENCIA STUARTII (A)  Final   Organism ID, Bacteria VANCOMYCIN RESISTANT ENTEROCOCCUS (A)  Final      Susceptibility   Providencia stuartii - MIC*    AMPICILLIN >=32 RESISTANT Resistant     CEFAZOLIN >=64 RESISTANT Resistant     CEFEPIME 0.25 SENSITIVE Sensitive     CEFTRIAXONE >=64 RESISTANT Resistant     CIPROFLOXACIN >=4 RESISTANT Resistant     GENTAMICIN RESISTANT Resistant     IMIPENEM 2 SENSITIVE Sensitive     NITROFURANTOIN 128 RESISTANT Resistant     TRIMETH/SULFA 160 RESISTANT Resistant     AMPICILLIN/SULBACTAM >=32 RESISTANT Resistant     PIP/TAZO <=4 SENSITIVE Sensitive     * 70,000 COLONIES/mL PROVIDENCIA STUARTII   Vancomycin resistant enterococcus - MIC*    AMPICILLIN <=2 SENSITIVE Sensitive     NITROFURANTOIN <=16 SENSITIVE Sensitive     VANCOMYCIN >=32 RESISTANT Resistant     LINEZOLID 2 SENSITIVE Sensitive     * >=100,000 COLONIES/mL VANCOMYCIN RESISTANT ENTEROCOCCUS         Radiology Studies: No results found.      Scheduled Meds: . diltiazem  120 mg Oral Daily  . gabapentin  300 mg Oral QHS  . guaiFENesin  1,200 mg Oral BID  . heparin injection (subcutaneous)  5,000 Units Subcutaneous Q8H  . insulin aspart  0-9 Units Subcutaneous TID WC  . lactulose  10 g Oral BID  . levothyroxine  88 mcg Oral Q0600  . metoprolol tartrate  25 mg Oral BID  .  pantoprazole  40 mg Oral Daily  . polyethylene glycol  17 g Oral Q8H  . potassium chloride  20 mEq Oral Once  . rifaximin  550 mg Oral BID  . sodium bicarbonate  650 mg Oral TID  . sodium chloride flush  3 mL Intravenous Q12H   Continuous Infusions: . sodium chloride Stopped (04/01/20 0906)  . piperacillin-tazobactam 2.25 g (04/11/20 0330)     LOS: 13 days    Time spent: 35 mins    Irine Seal, MD [facility]   To contact the attending provider between 7A-7P or the covering provider during after hours 7P-7A, please log into the web site [email or web address] and access using universal [place] password for that web site. If you do not have the password, please call the hospital operator.  04/11/2020, 11:30 AM

## 2020-04-11 NOTE — Progress Notes (Signed)
[facility] Progress Note   Subjective:   Had labs this AM - Cr up 4.5 up from 4.4 on 11/10, BUN 46 from 50s. K 3.2.  Doesn't like the food here but denies dysgeusia. UOP improved to 617mL yesterday. Says she feels a bit better and stronger today.  No new issues.   Objective Vitals:   04/10/20 1250 04/10/20 2109 04/11/20 0300 04/11/20 0624  BP: (!) 156/84 (!) 155/101  (!) 149/75  Pulse: 81 87  72  Resp: 18 18  18   Temp: 98.4 F (36.9 C) 97.7 F (36.5 C)  99.3 F (37.4 C)  TempSrc: Oral   Oral  SpO2: 100% 100%  97%  Weight:   65.9 kg   Height:       Physical Exam  General: lying in bed comfortable  Heart:RRR, no rub Lungs: clear ant and laterally Abdomen: soft, bladder not palpable GU: purewick Extremities:1+ LE edema, R foot > L foot Neuro: no asterixis  Additional Objective Labs: Basic Metabolic Panel: Recent Labs  Lab 04/08/20 0516 04/09/20 0621 04/11/20 0427  NA 128*  128* 133* 138  K 4.3  4.3 3.9 3.2*  CL 100  100 104 110  CO2 16*  17* 17* 15*  GLUCOSE 84  84 100* 104*  BUN 51*  50* 50* 46*  CREATININE 4.28*  4.07* 4.36* 4.50*  CALCIUM 8.0*  8.0* 7.8* 8.1*  PHOS 6.9*  7.1* 7.0* 6.2*   Liver Function Tests: Recent Labs  Lab 04/06/20 0532 04/06/20 0532 04/07/20 0450 04/07/20 0450 04/08/20 0516 04/09/20 0621 04/11/20 0427  AST 28  --  36  --  44*  --   --   ALT 16  --  21  --  26  --   --   ALKPHOS 97  --  107  --  122  --   --   BILITOT 0.9  --  0.7  --  1.0  --   --   PROT 5.7*  --  5.7*  --  5.9*  --   --   ALBUMIN 2.5*   < > 2.4*   < > 2.4*  2.3* 2.1* 2.2*   < > = values in this interval not displayed.   No results for input(s): LIPASE, AMYLASE in the last 168 hours. CBC: Recent Labs  Lab 04/06/20 0532 04/06/20 0532 04/07/20 0450 04/07/20 1537 04/08/20 0516 04/09/20 0621 04/11/20 0427  WBC 15.5*   < > 14.4*  --  11.0* 12.1* 14.7*  NEUTROABS 11.4*   < > 10.0*  --  8.1* 7.8*  --   HGB 8.4*   < > 8.0*   < >  9.2* 9.1* 9.1*  HCT 26.4*   < > 25.8*   < > 29.6* 28.5* 28.6*  MCV 80.2  --  80.4  --  80.9 79.2* 77.7*  PLT 240   < > 251  --  322 353 472*   < > = values in this interval not displayed.   Blood Culture    Component Value Date/Time   SDES  04/04/2020 1508    URINE, RANDOM Performed at [facility], [place] [address], [place], [place] [address]    SPECREQUEST  04/04/2020 1508    NONE Performed at [facility], [place] [address], [place], [place] [address]    CULT (A) 04/04/2020 1508    70,000 COLONIES/mL PROVIDENCIA STUARTII >=100,000 COLONIES/mL VANCOMYCIN RESISTANT ENTEROCOCCUS    REPTSTATUS 04/06/2020 FINAL 04/04/2020 1508  Cardiac Enzymes: No results for input(s): CKTOTAL, CKMB, CKMBINDEX, TROPONINI in the last 168 hours. CBG: Recent Labs  Lab 04/10/20 0754 04/10/20 1140 04/10/20 1812 04/10/20 2048 04/11/20 0732  GLUCAP 105* 113* 108* 105* 91   Iron Studies: No results for input(s): IRON, TIBC, TRANSFERRIN, FERRITIN in the last 72 hours. @lablastinr3 @ Studies/Results: No results found. Medications: . sodium chloride Stopped (04/01/20 0906)  . piperacillin-tazobactam 2.25 g (04/11/20 0330)   . diltiazem  120 mg Oral Daily  . gabapentin  300 mg Oral QHS  . guaiFENesin  1,200 mg Oral BID  . heparin injection (subcutaneous)  5,000 Units Subcutaneous Q8H  . insulin aspart  0-9 Units Subcutaneous TID WC  . lactulose  10 g Oral BID  . levothyroxine  88 mcg Oral Q0600  . metoprolol tartrate  25 mg Oral BID  . pantoprazole  40 mg Oral Daily  . polyethylene glycol  17 g Oral Q8H  . potassium chloride  20 mEq Oral Once  . rifaximin  550 mg Oral BID  . sodium bicarbonate  650 mg Oral TID  . sodium chloride flush  3 mL Intravenous Q12H    Dialysis Orders:  Assessment/Plan **AKI on CKD 4:  Baseline creatinine in the 2+ range of late - was at baseline on presentation but developed AKI mid hospitalization.  No hypotension.  The only  potential insult I see is contrast administration on 11/5 which temporally fits with the AKI.  She does have a UTI that is being treated but no evidence of AIN and I wouldn't change antibiotics.   Her UOP has improved; labs showing Cr rising very slowly.  Hopefully we will see her renal function start to improve imminently - I don't see any ongoing insults that we could correct.  Given her refusal of multiple interventions and diagnostics in addition to her poor baseline functional status she wouldn't be a candidate for dialysis.     **Multiorganism UTI:  Antibiotics per primary - I don't think they need to be changed as I don't suspect AIN.  **Anemia:  S/p transfusion - GI eval, no frank bleeding.  Further transfusion per primary.  Follows with hematology for leukocytosis, perhaps an ESA could be considered if Hb trending down but it's stable in the 9s at the moment.  **Metabolic acidosis: secondary to AKI, ^ po bicarb.    **Hyponatremia: ?SIADH.  Nadir 126 and improved to 138 today.   **Cirrhosis: new dx based on imaging here.  On lactulose and rifaxin. Outpt f/u planned.  I explored GOC with her again today - she was still noncommital; I think palliative care may be valuable - she has lots of comorbids and is high risk for repeated admission and global decline.   She is DNR.  She would not be a candidate for dialysis per above.    I think it's ok for her to discharge when primary sees fit.  I can see her in clinic.   Jannifer Hick MD 04/11/2020, 12:35 PM  [facility] Pager: [phone]

## 2020-04-12 ENCOUNTER — Inpatient Hospital Stay: Payer: Self-pay

## 2020-04-12 DIAGNOSIS — I5033 Acute on chronic diastolic (congestive) heart failure: Secondary | ICD-10-CM | POA: Diagnosis not present

## 2020-04-12 DIAGNOSIS — N179 Acute kidney failure, unspecified: Secondary | ICD-10-CM | POA: Diagnosis not present

## 2020-04-12 DIAGNOSIS — E039 Hypothyroidism, unspecified: Secondary | ICD-10-CM | POA: Diagnosis not present

## 2020-04-12 DIAGNOSIS — D649 Anemia, unspecified: Secondary | ICD-10-CM | POA: Diagnosis not present

## 2020-04-12 LAB — GLUCOSE, CAPILLARY
Glucose-Capillary: 75 mg/dL (ref 70–99)
Glucose-Capillary: 100 mg/dL — ABNORMAL HIGH (ref 70–99)
Glucose-Capillary: 134 mg/dL — ABNORMAL HIGH (ref 70–99)
Glucose-Capillary: 148 mg/dL — ABNORMAL HIGH (ref 70–99)

## 2020-04-12 LAB — RENAL FUNCTION PANEL
Albumin: 2.3 g/dL — ABNORMAL LOW (ref 3.5–5.0)
Anion gap: 13 (ref 5–15)
BUN: 46 mg/dL — ABNORMAL HIGH (ref 8–23)
CO2: 17 mmol/L — ABNORMAL LOW (ref 22–32)
Calcium: 8 mg/dL — ABNORMAL LOW (ref 8.9–10.3)
Chloride: 108 mmol/L (ref 98–111)
Creatinine, Ser: 4.86 mg/dL — ABNORMAL HIGH (ref 0.44–1.00)
GFR, Estimated: 9 mL/min — ABNORMAL LOW (ref >60)
Glucose, Bld: 108 mg/dL — ABNORMAL HIGH (ref 70–99)
Phosphorus: 6.4 mg/dL — ABNORMAL HIGH (ref 2.5–4.6)
Potassium: 3.2 mmol/L — ABNORMAL LOW (ref 3.5–5.1)
Sodium: 138 mmol/L (ref 135–145)

## 2020-04-12 LAB — CBC
HCT: 28.4 % — ABNORMAL LOW (ref 36.0–46.0)
Hemoglobin: 9.1 g/dL — ABNORMAL LOW (ref 12.0–15.0)
MCH: 24.6 pg — ABNORMAL LOW (ref 26.0–34.0)
MCHC: 32 g/dL (ref 30.0–36.0)
MCV: 76.8 fL — ABNORMAL LOW (ref 80.0–100.0)
Platelets: 602 10*3/uL — ABNORMAL HIGH (ref 150–400)
RBC: 3.7 MIL/uL — ABNORMAL LOW (ref 3.87–5.11)
RDW: 28.7 % — ABNORMAL HIGH (ref 11.5–15.5)
WBC: 15.8 10*3/uL — ABNORMAL HIGH (ref 4.0–10.5)
nRBC: 0 % (ref 0.0–0.2)

## 2020-04-12 MED ORDER — APIXABAN 2.5 MG PO TABS
2.5000 mg | ORAL_TABLET | Freq: Two times a day (BID) | ORAL | Status: DC
  Administered 2020-04-12 – 2020-04-20 (×18): 2.5 mg via ORAL
  Filled 2020-04-12 (×19): qty 1

## 2020-04-12 MED ORDER — SODIUM CHLORIDE 0.9% FLUSH
10.0000 mL | Freq: Two times a day (BID) | INTRAVENOUS | Status: DC
  Administered 2020-04-13 (×2): 10 mL

## 2020-04-12 MED ORDER — SODIUM CHLORIDE 0.9% FLUSH: 10.0000 mL | INTRAVENOUS | Status: DC | PRN

## 2020-04-12 MED ORDER — POLYETHYLENE GLYCOL 3350 17 G PO PACK
17.0000 g | PACK | Freq: Two times a day (BID) | ORAL | Status: DC
  Filled 2020-04-12: qty 1

## 2020-04-12 MED ORDER — POTASSIUM CHLORIDE CRYS ER 20 MEQ PO TBCR
20.0000 meq | EXTENDED_RELEASE_TABLET | Freq: Once | ORAL | Status: AC
  Administered 2020-04-12: 20 meq via ORAL
  Filled 2020-04-12: qty 1

## 2020-04-12 NOTE — Progress Notes (Signed)
PROGRESS NOTE    Sarah Colon  YPP:[id number] DOB: 02-05-1946 DOA: 03/24/2020 PCP: Patient, No Pcp Per    Chief Complaint  Patient presents with  . Cough  . Shortness of Breath    Brief Narrative:  The patient is a 74 year old African-American female with a past medical history significant for but not limited to diabetes mellitus type 2, diastolic CHF, hypertension, paroxysmal atrial fibrillation, chronic kidney disease stage IV with a baseline creatinine of 1.5-1.8 as well as other comorbidities who presented to the ED with shortness of breath, chest pain, fatigue over the last week.  She also reported a poor appetite.  She is found to have a hemoglobin of 3 on admission.  There are no reports of blood in her stool or dark or tarry stools.  Fecal occult test in the ED was positive.  GI was consulted and she was admitted to the hospitalist service.  She normally lives in SNF is nonambulatory and in the wheelchair.  She underwent EGD and colonoscopy without evidence of any bleeding.  She had multiple polyp removal from her colonoscopy today.  Her prep was poor.  GI recommending resuming Xarelto 04/03/20 and she received another dose of diuresis yesterday given her volume overload.  Her Xarelto is now resumed today but this evening on 04/04/2019 when she spiked a temperature and she does have a leukocytosis which is chronic but we will panculture to rule out infection.  She is complaining of some abdominal pain so we will get a CT of the abdomen pelvis.  Chest x-ray shows possible pneumonia so she was started on Unasyn and SLP was consulted given the location being the Right Lung Base.  **After further workup it appears the patient has a UTI with Providencia and Entercoccus Faecalis. We will escalate her antibiotics to IV meropenem and hold off of Vancomycin for now. Repeat CT scan showed persistent mild perirectal and presacral fat stranding of unclear etiology and presacral rectal findings  associate with cortical erosions, sclerosis and thinning of the S1 vertebral body. As well as the urinary bladder that was thickened circumferentially. Hemoglobin has dropped a little bit GI feels that the CT findings are in the setting of fecal impaction and due to stool inflammation from the fecal impaction. They feel that her anemia worsens and she will need a flex sig with enemas for prep prior to discharge to reevaluate the rectum after resolution of the fecal impaction. We will escalate antibiotics given her UTI.  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  Her renal function continues to worsen and we are doing further work-up will obtain a bladder scan, renal ultrasound and started the patient back on gentle IV fluids with normal saline at 25 mils per hour.  We are to place a Foley catheter for strict I's and O's however patient refused this.  We will continue monitor and will notify nephrology about her worsening renal function.  Nephrology feels that Contrast from last CT scan caused her AKI and GI has signed off and feel as if her Abdominal Pain is multifactorial. They are going to hold off on Flex Sigmoidoscopy.     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  Currently on heparin.  FOBT which was done was positive.  Patient status post  transfusion of 3 units packed red blood cells.  Hemoglobin on admission was 3.1 and currently at 9.1.  Patient with no overt bleeding.  Patient seen in consultation by GI underwent upper endoscopy 04/03/2020 without any significant findings or source of GI bleed.  Patient underwent rescheduled colonoscopy 04/09/2020 with poor prep and showed 2 to 5 mm polyps in the cecum, 3 5-9MM polyps in the transverse colon which were removed.  Biopsies consistent with tubular adenoma without high-grade dysplasia.  Due to poor prep GI recommending repeat colonoscopy in 6 months.  Patient currently on a soft diet.  Unable to resume Xarelto due to renal function as such we will place on Eliquis 2.5 twice daily.  CT abdomen and pelvis was done which showed focal rectal wall thickening, acute cellular fluid as well as circumferential thickened bladder.  Leukocytosis fluctuating.  GI was reconsulted and patient given a soapsuds enema due to concerns for fecal impaction..  Per GI if continued drop in hemoglobin will need a flex sigmoidoscopy.  Currently holding off on flex sigmoidoscopy.  GI has signed off.  Follow H&H.  Transfusion threshold hemoglobin <7.  2.  Left lower quadrant abdominal pain/fecal impaction CT abdomen and pelvis done without any acute findings.  Patient noted to have had a fever with a leukocytosis.  Fever likely secondary from UTI.  CT with focal rectal wall thickening of acute cellular fluid as well as circumferentially thickened bladder.  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  On empiric IV antibiotics.  Patient was seen in consultation by GI who felt patient had a component of fecal impaction causing her abdominal pain.  Soapsuds enema was ordered early on during the hospitalization.  Patient with complaints of diffuse abdominal pain and stated has not had a bowel movement in the several days.  Episodes enema.  Patient having bowel movements.  Patient on bowel regimen of lactulose twice  daily, MiraLAX 3 times daily.  Decrease MiraLAX to twice daily.  Supportive care.  Follow.   3.  Liver cirrhosis New diagnosis.  MRI of abdomen was degraded exam however no concerning lesions noted repeat MRI recommended which can be done in the outpatient setting.  Continue Xifaxan, lactulose.  Outpatient follow-up with GI.  4.  Acute metabolic encephalopathy Likely secondary to symptomatic profound anemia.  Ammonia levels within normal limits.  Encephalopathy resolved and currently at baseline.  5.  Acute renal failure on chronic kidney disease stage IV Baseline creatinine approximately 2.  Patient seen in consultation by nephrology who do not feel patient has any evidence of AIN and do not recommend any change in antibiotics.  Patient could likely have developed acute insult on chronic kidney disease stage IV from contrast nephropathy from CT done 04/04/2020.  Creatinine currently at 4.86 from 4.50 from 4.36(04/09/2020) from 4.28 from 4.07 from 3.76 from 3.62 from 2.95 from 1.87 from 1.41 from 2.30 on admission.  Nephrology does not feel patient is a good long-term dialysis candidate but  discussed with patient for temporary dialysis if needed.  Patient  with a urine output of 301 cc over the past 24 hours.  Patient developing some lower extremity edema.  IV fluids have been saline locked per nephrology.  Gabapentin dose decreased to 300 mg nightly.  Patient states due to difficulty obtaining labs was why she refused labs this morning.  Patient agreed and got labs around 04/10/1929 today.  Patient states she is not ready to die and states she will comply with what is required from her.  Will place a midline to aid with blood draws in the morning.  Per nephrology.   6.  Providencia stuartii and VRE UTI Status post IV antibiotics.  Supportive care.  Follow.     7.  Metabolic acidosis Likely secondary to acute kidney injury.  Continue bicarb tablets.  Per nephrology.  Follow.   8.  Hyponatremia ??   SIADH.  Patient on 1200 cc fluid restriction per nephrology.  Sodium level improving.  Sodium currently at 138.  Patient with some lower extremity edema.  IV fluids have been saline lock.  Follow.   9.  Hyperkalemia Resolved with Lokelma.  Now with hypokalemia.  10.  Acute on chronic diastolic CHF Patient noted during the hospitalization early on to be volume overloaded on examination with lower extremity edema.  BNP elevated at 330 however patient with chronic kidney disease.  Pleural effusions noted on chest x-ray.  Patient status post intermittent diuresis with clinical improvement.  Patient with no respiratory symptoms.  Patient with some lower extremity edema.  Follow.    11.  Hypothyroidism Synthroid.    12.  Well-controlled diabetes mellitus type 2 Hemoglobin A1c 5.6.  CBG of 100 this morning.  Sliding scale insulin.  13.  Paroxysmal atrial fibrillation Continue beta-blocker for rate control.  Hemoglobin currently stable at 9.1.  Unable to resume Xarelto due to renal function.  Eliquis 2.5 mg twice daily.   14.  Hypokalemia Replete.  K. Dur 20 mEq p.o. x1.  15. Sacral ulcer, POA Pressure Injury 04/18/2020 Sacrum Medial Stage III -  Full thickness tissue loss. Subcutaneous fat may be visible but bone, tendon or muscle are NOT exposed. (Active)  04/20/2020 1149  Location: Sacrum  Location Orientation: Medial  Staging: Stage III -  Full thickness tissue loss. Subcutaneous fat may be visible but bone, tendon or muscle are NOT exposed.  Wound Description (Comments):   Present on Admission: Yes         DVT prophylaxis: SCDs>>> Heparin>>> Eliquis Code Status: DNR Family Communication: Updated patient.  No family at bedside. Disposition:   Status is: Inpatient    Dispo: The patient is from: SNF              Anticipated d/c is to: SNF              Anticipated d/c date is: To be determined.  When cleared by nephrology.              Patient currently with acute on chronic  kidney disease stage IV, being followed by nephrology, UTI on IV antibiotics, anemia status post transfusion.  Not stable for discharge.       Consultants:   Nephrology: Dr. Johnney Ou 04/07/2020  Gastroenterology: Dr. Alessandra Bevels 03/30/2020  Procedures:   Transfuse 3 units packed red blood cells 03/28/2020--04/20/2020  Colonoscopy 04/05/2020--per Dr. Alessandra Bevels  Upper endoscopy 04/23/2020--by Dr. Alessandra Bevels  CT abdomen and pelvis 04/04/2020, 03/18/2020  CT pelvis 03/05/2020  Chest x-ray 03/03/2020, 03/30/2020, 04/03/2020  MRI liver 04/01/2020  Renal ultrasound 04/06/2020  2D echo 03/30/2020  Antimicrobials:  Anti-infectives (From admission, onward)   Start     Dose/Rate Route Frequency Ordered Stop   04/06/20 1430  piperacillin-tazobactam (ZOSYN) IVPB 2.25 g        2.25 g 100 mL/hr over 30 Minutes Intravenous Every 6 hours 04/06/20 1320 04/11/20 2359   04/05/20 1930  meropenem (MERREM) 1 g in sodium chloride 0.9 % 100 mL IVPB  Status:  Discontinued        1 g 200 mL/hr over 30 Minutes Intravenous Every 12 hours 04/05/20 1832 04/06/20 1320   04/04/20 1000  Ampicillin-Sulbactam (UNASYN) 3 g in sodium chloride 0.9 % 100 mL IVPB  Status:  Discontinued        3 g 200 mL/hr over 30 Minutes Intravenous Every 12 hours 04/04/20 0851 04/05/20 1829   03/30/20 1400  rifaximin (XIFAXAN) tablet 550 mg        550 mg Oral 2 times daily 03/30/20 1136         Subjective: Patient sitting up in bed.  States abdominal pain improved.  Having bowel movements.  Complaining of gurgling in her abdomen.  Denies any chest pain or shortness of breath.  Stated that she was a difficult stick however agreed for labs to be drawn today.  Patient states she is not ready to die and has agreed to get labs drawn.  Patient agreed for midline placement to help with lab draws in the morning.  Urine output of 301 cc recorded over the past 24 hours.  Objective: Vitals:   04/11/20 1317 04/11/20 2021 04/12/20 0500  04/12/20 0515  BP: (!) 152/60 (!) 129/59  (!) 133/57  Pulse: 76 67  78  Resp: 19 19  16   Temp: 98.3 F (36.8 C) 98 F (36.7 C)  98.2 F (36.8 C)  TempSrc: Oral Oral  Oral  SpO2: 99% 100%  100%  Weight:   66.8 kg   Height:        Intake/Output Summary (Last 24 hours) at 04/12/2020 1249 Last data filed at 04/12/2020 0301 Gross per 24 hour  Intake 0 ml  Output 302 ml  Net -302 ml   Filed Weights   04/10/20 0500 04/11/20 0300 04/12/20 0500  Weight: 66.7 kg 65.9 kg 66.8 kg    Examination:  General exam: NAD Respiratory system: Lungs clear to auscultation bilaterally.  No wheezes, no crackles, no rhonchi.  Normal respiratory effort.  Cardiovascular system: RRR no murmurs rubs or gallops.  No JVD.  1-2+ bilateral lower extremity edema.  Gastrointestinal system: Abdomen is soft, decreased diffuse tenderness to palpation, less distended, positive bowel sounds.  No rebound.  No guarding.  Central nervous system: Alert and oriented. No focal neurological deficits. Extremities: Symmetric 5 x 5 power. Skin: No rashes, lesions or ulcers Psychiatry: Judgement and insight appear normal. Mood & affect appropriate.     Data Reviewed: I have personally reviewed following labs and imaging studies  CBC: Recent Labs  Lab 04/06/20 0532 04/06/20 0532 04/07/20 0450 04/07/20 0450 04/07/20 1537 04/08/20 0516 04/09/20 0621 04/11/20 0427 04/12/20 1130  WBC 15.5*   < > 14.4*  --   --  11.0* 12.1* 14.7* 15.8*  NEUTROABS 11.4*  --  10.0*  --   --  8.1* 7.8*  --   --   HGB 8.4*   < > 8.0*   < > 9.2* 9.2* 9.1* 9.1* 9.1*  HCT 26.4*   < > 25.8*   < > 29.0* 29.6* 28.5* 28.6*  28.4*  MCV 80.2   < > 80.4  --   --  80.9 79.2* 77.7* 76.8*  PLT 240   < > 251  --   --  322 353 472* 602*   < > = values in this interval not displayed.    Basic Metabolic Panel: Recent Labs  Lab 04/06/20 0532 04/06/20 0532 04/07/20 0450 04/07/20 0450 04/07/20 1537 04/08/20 0516 04/09/20 0621 04/11/20 0427  04/12/20 1130  NA 133*   < > 126*   < > 128* 128*  128* 133* 138 138  K 4.3   < > 4.1   < > 4.4 4.3  4.3 3.9 3.2* 3.2*  CL 102   < > 98   < > 98 100  100 104 110 108  CO2 20*   < > 18*   < > 19* 16*  17* 17* 15* 17*  GLUCOSE 116*   < > 98   < > 101* 84  84 100* 104* 108*  BUN 42*   < > 43*   < > 50* 51*  50* 50* 46* 46*  CREATININE 2.95*   < > 3.62*   < > 3.76* 4.28*  4.07* 4.36* 4.50* 4.86*  CALCIUM 7.7*   < > 7.5*   < > 7.7* 8.0*  8.0* 7.8* 8.1* 8.0*  MG 2.2  --  2.0  --   --  2.1  --   --   --   PHOS 5.1*   < > 5.4*  --   --  6.9*  7.1* 7.0* 6.2* 6.4*   < > = values in this interval not displayed.    GFR: Estimated Creatinine Clearance: 8.7 mL/min (A) (by C-G formula based on SCr of 4.86 mg/dL (H)).  Liver Function Tests: Recent Labs  Lab 04/06/20 0532 04/06/20 0532 04/07/20 0450 04/08/20 0516 04/09/20 0621 04/11/20 0427 04/12/20 1130  AST 28  --  36 44*  --   --   --   ALT 16  --  21 26  --   --   --   ALKPHOS 97  --  107 122  --   --   --   BILITOT 0.9  --  0.7 1.0  --   --   --   PROT 5.7*  --  5.7* 5.9*  --   --   --   ALBUMIN 2.5*   < > 2.4* 2.4*  2.3* 2.1* 2.2* 2.3*   < > = values in this interval not displayed.    CBG: Recent Labs  Lab 04/11/20 1311 04/11/20 1644 04/11/20 2133 04/12/20 0800 04/12/20 1246  GLUCAP 109* 106* 119* 75 100*     Recent Results (from the past 240 hour(s))  Culture, blood (Routine X 2) w Reflex to ID Panel     Status: None   Collection Time: 04/04/20  6:25 AM   Specimen: BLOOD  Result Value Ref Range Status   Specimen Description   Final    BLOOD RIGHT ANTECUBITAL Performed at [facility], [place] [address], [place], [place] [address]    Special Requests   Final    BOTTLES DRAWN AEROBIC ONLY Blood Culture adequate volume Performed at [place] [address] [place], [place] [address]    Culture   Final    NO GROWTH 5 DAYS Performed at [facility], [place]  [address] [place], [place] [address]    Report Status 04/09/2020 FINAL  Final  Culture, blood (Routine X 2) w Reflex to ID Panel     Status: None   Collection Time: 04/04/20  6:25 AM   Specimen: BLOOD RIGHT HAND  Result Value Ref Range Status   Specimen Description   Final    BLOOD RIGHT HAND Performed at [place] [address], [place], [place] [address]    Special Requests   Final    BOTTLES DRAWN AEROBIC AND ANAEROBIC Blood Culture adequate volume Performed at [place] [address] [place], [place] [address]    Culture   Final    NO GROWTH 5 DAYS Performed at [facility], [place] [address] [place], [place] [address]    Report Status 04/09/2020 FINAL  Final  Culture, Urine     Status: Abnormal   Collection Time: 04/04/20  3:08 PM   Specimen: Urine, Random  Result Value Ref Range Status   Specimen Description   Final    URINE, RANDOM Performed at [place] [address], [place], [place] [address]    Special Requests   Final    NONE Performed at [facility], [place] [address], [place],  [address]    Culture (A)  Final    70,000 COLONIES/mL PROVIDENCIA STUARTII >=100,000 COLONIES/mL VANCOMYCIN RESISTANT ENTEROCOCCUS    Report Status 04/06/2020 FINAL  Final   Organism ID, Bacteria PROVIDENCIA STUARTII (A)  Final   Organism ID, Bacteria VANCOMYCIN RESISTANT ENTEROCOCCUS (A)  Final      Susceptibility   Providencia stuartii - MIC*    AMPICILLIN >=32 RESISTANT Resistant     CEFAZOLIN >=64 RESISTANT Resistant     CEFEPIME 0.25 SENSITIVE Sensitive     CEFTRIAXONE >=64 RESISTANT Resistant     CIPROFLOXACIN >=4 RESISTANT Resistant     GENTAMICIN RESISTANT Resistant     IMIPENEM 2 SENSITIVE Sensitive     NITROFURANTOIN 128 RESISTANT Resistant     TRIMETH/SULFA 160 RESISTANT Resistant     AMPICILLIN/SULBACTAM >=32 RESISTANT Resistant     PIP/TAZO <=4 SENSITIVE Sensitive     *  70,000 COLONIES/mL PROVIDENCIA STUARTII   Vancomycin resistant enterococcus - MIC*    AMPICILLIN <=2 SENSITIVE Sensitive     NITROFURANTOIN <=16 SENSITIVE Sensitive     VANCOMYCIN >=32 RESISTANT Resistant     LINEZOLID 2 SENSITIVE Sensitive     * >=100,000 COLONIES/mL VANCOMYCIN RESISTANT ENTEROCOCCUS         Radiology Studies: [place] EKG SITE RITE  Result Date: 04/12/2020 If Site Rite image not attached, placement could not be confirmed due to current cardiac rhythm.       Scheduled Meds: . apixaban  2.5 mg Oral BID  . diltiazem  120 mg Oral Daily  . gabapentin  300 mg Oral QHS  . guaiFENesin  1,200 mg Oral BID  . insulin aspart  0-9 Units Subcutaneous TID WC  . lactulose  10 g Oral BID  . levothyroxine  88 mcg Oral Q0600  . metoprolol tartrate  25 mg Oral BID  . pantoprazole  40 mg Oral Daily  . polyethylene glycol  17 g Oral Q8H  . rifaximin  550 mg Oral BID  . sodium bicarbonate  1,300 mg Oral TID  . sodium chloride flush  3 mL Intravenous Q12H   Continuous Infusions: . sodium chloride Stopped (04/01/20 0906)     LOS: 14 days    Time spent: 35 mins    Irine Seal, MD [facility]  To contact the attending provider between 7A-7P or the covering provider during after hours 7P-7A, please log into the web site [email or web address] and access using universal [place] password for that web site. If you do not have the password, please call the hospital operator.  04/12/2020, 12:49 PM

## 2020-04-12 NOTE — Progress Notes (Signed)
[facility] Progress Note   Subjective:   Only interaction today is to say "I don't want to be bothered."  When I ask follow up questions she says "What did I just say?" UOP 366mL yesterday per I/Os.   Objective Vitals:   04/11/20 1317 04/11/20 2021 04/12/20 0500 04/12/20 0515  BP: (!) 152/60 (!) 129/59  (!) 133/57  Pulse: 76 67  78  Resp: 19 19  16   Temp: 98.3 F (36.8 C) 98 F (36.7 C)  98.2 F (36.8 C)  TempSrc: Oral Oral  Oral  SpO2: 99% 100%  100%  Weight:   66.8 kg   Height:       Physical Exam  General: lying in bed comfortable  Lungs: normal WOB GU: purewick Extremities:1+ LE edema, R foot > L foot Neuro: awake, alert, conversant  Additional Objective Labs: Basic Metabolic Panel: Recent Labs  Lab 04/08/20 0516 04/09/20 0621 04/11/20 0427  NA 128*  128* 133* 138  K 4.3  4.3 3.9 3.2*  CL 100  100 104 110  CO2 16*  17* 17* 15*  GLUCOSE 84  84 100* 104*  BUN 51*  50* 50* 46*  CREATININE 4.28*  4.07* 4.36* 4.50*  CALCIUM 8.0*  8.0* 7.8* 8.1*  PHOS 6.9*  7.1* 7.0* 6.2*   Liver Function Tests: Recent Labs  Lab 04/06/20 0532 04/06/20 0532 04/07/20 0450 04/07/20 0450 04/08/20 0516 04/09/20 0621 04/11/20 0427  AST 28  --  36  --  44*  --   --   ALT 16  --  21  --  26  --   --   ALKPHOS 97  --  107  --  122  --   --   BILITOT 0.9  --  0.7  --  1.0  --   --   PROT 5.7*  --  5.7*  --  5.9*  --   --   ALBUMIN 2.5*   < > 2.4*   < > 2.4*  2.3* 2.1* 2.2*   < > = values in this interval not displayed.   No results for input(s): LIPASE, AMYLASE in the last 168 hours. CBC: Recent Labs  Lab 04/06/20 0532 04/06/20 0532 04/07/20 0450 04/07/20 1537 04/08/20 0516 04/09/20 0621 04/11/20 0427  WBC 15.5*   < > 14.4*  --  11.0* 12.1* 14.7*  NEUTROABS 11.4*   < > 10.0*  --  8.1* 7.8*  --   HGB 8.4*   < > 8.0*   < > 9.2* 9.1* 9.1*  HCT 26.4*   < > 25.8*   < > 29.6* 28.5* 28.6*  MCV 80.2  --  80.4  --  80.9 79.2* 77.7*  PLT 240   < >  251  --  322 353 472*   < > = values in this interval not displayed.   Blood Culture    Component Value Date/Time   SDES  04/04/2020 1508    URINE, RANDOM Performed at [facility], [place] [address] [place], [place] [address]    SPECREQUEST  04/04/2020 1508    NONE Performed at [facility], [place] [address] [place], [place] [address]    CULT (A) 04/04/2020 1508    70,000 COLONIES/mL PROVIDENCIA STUARTII >=100,000 COLONIES/mL VANCOMYCIN RESISTANT ENTEROCOCCUS    REPTSTATUS 04/06/2020 FINAL 04/04/2020 1508    Cardiac Enzymes: No results for input(s): CKTOTAL, CKMB, CKMBINDEX, TROPONINI in the last 168 hours. CBG: Recent Labs  Lab 04/11/20  0732 04/11/20 1311 04/11/20 1644 04/11/20 2133 04/12/20 0800  GLUCAP 91 109* 106* 119* 75   Iron Studies: No results for input(s): IRON, TIBC, TRANSFERRIN, FERRITIN in the last 72 hours. @lablastinr3 @ Studies/Results: No results found. Medications: . sodium chloride Stopped (04/01/20 0906)   . apixaban  2.5 mg Oral BID  . diltiazem  120 mg Oral Daily  . gabapentin  300 mg Oral QHS  . guaiFENesin  1,200 mg Oral BID  . insulin aspart  0-9 Units Subcutaneous TID WC  . lactulose  10 g Oral BID  . levothyroxine  88 mcg Oral Q0600  . metoprolol tartrate  25 mg Oral BID  . pantoprazole  40 mg Oral Daily  . polyethylene glycol  17 g Oral Q8H  . rifaximin  550 mg Oral BID  . sodium bicarbonate  1,300 mg Oral TID  . sodium chloride flush  3 mL Intravenous Q12H    Dialysis Orders:  Assessment/Plan **AKI on CKD 4:  Baseline creatinine in the 2+ range of late - was at baseline on presentation but developed AKI mid hospitalization.  No hypotension.  The only potential insult I see is contrast administration on 11/5 which temporally fits with the AKI.  She does have a UTI that is being treated but no evidence of AIN and I wouldn't change antibiotics.   Her UOP has improved; labs showing Cr rising very  slowly but she's refusing labs for now.  Hopefully we will see her renal function start to improve imminently - I don't see any ongoing insults that we could correct.  Given her refusal of multiple interventions and diagnostics in addition to her poor baseline functional status she wouldn't be a candidate for dialysis.     **Multiorganism UTI:  Antibiotics per primary - I don't think they need to be changed as I don't suspect AIN.  **Anemia:  S/p transfusion - GI eval, no frank bleeding.  Further transfusion per primary.  Follows with hematology for leukocytosis, perhaps an ESA could be considered if Hb trending down but it's stable in the 9s at the moment.  **Metabolic acidosis: secondary to AKI, ^ po bicarb.    **Hyponatremia: ?SIADH.  Nadir 126 and improved to 138 yesterday.   **Cirrhosis: new dx based on imaging here.  On lactulose and rifaxin. Outpt f/u planned.  I explored GOC with her again yesterday- she was still noncommital; I think palliative care may be valuable - she has lots of comorbids and is high risk for repeated admission and global decline.   She is DNR.  She would not be a candidate for dialysis per above.    Our interaction today again would support a palliative approach but she's not willing to have a specific conversation to that effect.  She certainly is going that path by her interactions and treatment refusals.    Jannifer Hick MD 04/12/2020, 9:54 AM  [facility] Pager: [phone]

## 2020-04-13 ENCOUNTER — Inpatient Hospital Stay: Payer: Self-pay

## 2020-04-13 DIAGNOSIS — I5033 Acute on chronic diastolic (congestive) heart failure: Secondary | ICD-10-CM | POA: Diagnosis not present

## 2020-04-13 DIAGNOSIS — N179 Acute kidney failure, unspecified: Secondary | ICD-10-CM | POA: Diagnosis not present

## 2020-04-13 DIAGNOSIS — D72829 Elevated white blood cell count, unspecified: Secondary | ICD-10-CM

## 2020-04-13 DIAGNOSIS — D649 Anemia, unspecified: Secondary | ICD-10-CM | POA: Diagnosis not present

## 2020-04-13 DIAGNOSIS — E039 Hypothyroidism, unspecified: Secondary | ICD-10-CM | POA: Diagnosis not present

## 2020-04-13 LAB — GLUCOSE, CAPILLARY
Glucose-Capillary: 97 mg/dL (ref 70–99)
Glucose-Capillary: 117 mg/dL — ABNORMAL HIGH (ref 70–99)
Glucose-Capillary: 124 mg/dL — ABNORMAL HIGH (ref 70–99)
Glucose-Capillary: 148 mg/dL — ABNORMAL HIGH (ref 70–99)

## 2020-04-13 LAB — CBC
HCT: 28.9 % — ABNORMAL LOW (ref 36.0–46.0)
Hemoglobin: 9.3 g/dL — ABNORMAL LOW (ref 12.0–15.0)
MCH: 24.8 pg — ABNORMAL LOW (ref 26.0–34.0)
MCHC: 32.2 g/dL (ref 30.0–36.0)
MCV: 77.1 fL — ABNORMAL LOW (ref 80.0–100.0)
Platelets: 689 10*3/uL — ABNORMAL HIGH (ref 150–400)
RBC: 3.75 MIL/uL — ABNORMAL LOW (ref 3.87–5.11)
RDW: 29.2 % — ABNORMAL HIGH (ref 11.5–15.5)
WBC: 18.3 10*3/uL — ABNORMAL HIGH (ref 4.0–10.5)
nRBC: 0 % (ref 0.0–0.2)

## 2020-04-13 LAB — RENAL FUNCTION PANEL
Albumin: 2.3 g/dL — ABNORMAL LOW (ref 3.5–5.0)
Anion gap: 9 (ref 5–15)
BUN: 46 mg/dL — ABNORMAL HIGH (ref 8–23)
CO2: 19 mmol/L — ABNORMAL LOW (ref 22–32)
Calcium: 7.8 mg/dL — ABNORMAL LOW (ref 8.9–10.3)
Chloride: 105 mmol/L (ref 98–111)
Creatinine, Ser: 4.82 mg/dL — ABNORMAL HIGH (ref 0.44–1.00)
GFR, Estimated: 9 mL/min — ABNORMAL LOW (ref >60)
Glucose, Bld: 123 mg/dL — ABNORMAL HIGH (ref 70–99)
Phosphorus: 5.9 mg/dL — ABNORMAL HIGH (ref 2.5–4.6)
Potassium: 3.1 mmol/L — ABNORMAL LOW (ref 3.5–5.1)
Sodium: 133 mmol/L — ABNORMAL LOW (ref 135–145)

## 2020-04-13 MED ORDER — ALBUMIN HUMAN 25 % IV SOLN
25.0000 g | Freq: Four times a day (QID) | INTRAVENOUS | Status: AC
  Administered 2020-04-13 – 2020-04-14 (×4): 25 g via INTRAVENOUS
  Filled 2020-04-13 (×7): qty 100

## 2020-04-13 MED ORDER — POLYETHYLENE GLYCOL 3350 17 G PO PACK
17.0000 g | PACK | Freq: Every day | ORAL | Status: DC | PRN
  Administered 2020-04-17: 17 g via ORAL
  Filled 2020-04-13: qty 1

## 2020-04-13 MED ORDER — POTASSIUM CHLORIDE 20 MEQ PO PACK
40.0000 meq | PACK | Freq: Once | ORAL | Status: AC
  Administered 2020-04-13: 40 meq via ORAL
  Filled 2020-04-13: qty 2

## 2020-04-13 NOTE — Progress Notes (Signed)
[facility] Progress Note   Subjective:   No complaints on my assessment today but not wanting to answer questions again.  Has told primary will have labs checked.  UOP 364mL yesterday per I/Os.   Objective Vitals:   04/12/20 1332 04/12/20 2216 04/12/20 2242 04/13/20 0536  BP: (!) 163/85 138/71 138/71 (!) 129/52  Pulse: 83 (!) 115 96 84  Resp: (!) 22 18 18 18   Temp: 98.5 F (36.9 C) 98.8 F (37.1 C) 98.8 F (37.1 C) 98.8 F (37.1 C)  TempSrc: Oral Oral  Oral  SpO2: 100% 100% 100% 99%  Weight:    67.2 kg  Height:       Physical Exam  General: lying in bed comfortable, chronically ill and weak but nontoxic Lungs: normal WOB GU: purewick Extremities:1+ LE edema, R foot > L foot Neuro: awake, alert, conversant  Additional Objective Labs: Basic Metabolic Panel: Recent Labs  Lab 04/11/20 0427 04/12/20 1130 04/13/20 1031  NA 138 138 133*  K 3.2* 3.2* 3.1*  CL 110 108 105  CO2 15* 17* 19*  GLUCOSE 104* 108* 123*  BUN 46* 46* 46*  CREATININE 4.50* 4.86* 4.82*  CALCIUM 8.1* 8.0* 7.8*  PHOS 6.2* 6.4* 5.9*   Liver Function Tests: Recent Labs  Lab 04/07/20 0450 04/07/20 0450 04/08/20 0516 04/09/20 0621 04/11/20 0427 04/12/20 1130 04/13/20 1031  AST 36  --  44*  --   --   --   --   ALT 21  --  26  --   --   --   --   ALKPHOS 107  --  122  --   --   --   --   BILITOT 0.7  --  1.0  --   --   --   --   PROT 5.7*  --  5.9*  --   --   --   --   ALBUMIN 2.4*   < > 2.4*  2.3*   < > 2.2* 2.3* 2.3*   < > = values in this interval not displayed.   No results for input(s): LIPASE, AMYLASE in the last 168 hours. CBC: Recent Labs  Lab 04/07/20 0450 04/07/20 1537 04/08/20 0516 04/08/20 0516 04/09/20 0621 04/09/20 0621 04/11/20 0427 04/12/20 1130 04/13/20 1031  WBC 14.4*  --  11.0*   < > 12.1*   < > 14.7* 15.8* 18.3*  NEUTROABS 10.0*  --  8.1*  --  7.8*  --   --   --   --   HGB 8.0*   < > 9.2*   < > 9.1*   < > 9.1* 9.1* 9.3*  HCT 25.8*   < >  29.6*   < > 28.5*   < > 28.6* 28.4* 28.9*  MCV 80.4  --  80.9  --  79.2*  --  77.7* 76.8* 77.1*  PLT 251  --  322   < > 353   < > 472* 602* 689*   < > = values in this interval not displayed.   Blood Culture    Component Value Date/Time   SDES  04/04/2020 1508    URINE, RANDOM Performed at [facility], [place] [address], [place], [place] [address]    SPECREQUEST  04/04/2020 1508    NONE Performed at [facility], [place] [address] [place], [place] [address]    CULT (A) 04/04/2020 1508    70,000 COLONIES/mL PROVIDENCIA STUARTII >=100,000 COLONIES/mL VANCOMYCIN RESISTANT ENTEROCOCCUS  REPTSTATUS 04/06/2020 FINAL 04/04/2020 1508    Cardiac Enzymes: No results for input(s): CKTOTAL, CKMB, CKMBINDEX, TROPONINI in the last 168 hours. CBG: Recent Labs  Lab 04/12/20 1246 04/12/20 1641 04/12/20 2221 04/13/20 0732 04/13/20 1115  GLUCAP 100* 134* 148* 97 117*   Iron Studies: No results for input(s): IRON, TIBC, TRANSFERRIN, FERRITIN in the last 72 hours. @lablastinr3 @ Studies/Results: [place] EKG SITE RITE  Result Date: 04/13/2020 If Site Rite image not attached, placement could not be confirmed due to current cardiac rhythm.  [place] EKG SITE RITE  Result Date: 04/12/2020 If Site Rite image not attached, placement could not be confirmed due to current cardiac rhythm.  Medications: . sodium chloride Stopped (04/01/20 0906)  . albumin human     . apixaban  2.5 mg Oral BID  . diltiazem  120 mg Oral Daily  . gabapentin  300 mg Oral QHS  . guaiFENesin  1,200 mg Oral BID  . insulin aspart  0-9 Units Subcutaneous TID WC  . lactulose  10 g Oral BID  . levothyroxine  88 mcg Oral Q0600  . metoprolol tartrate  25 mg Oral BID  . pantoprazole  40 mg Oral Daily  . polyethylene glycol  17 g Oral BID  . potassium chloride  40 mEq Oral Once  . rifaximin  550 mg Oral BID  . sodium bicarbonate  1,300 mg Oral TID  . sodium chloride flush  10-40 mL  Intracatheter Q12H  . sodium chloride flush  3 mL Intravenous Q12H    Dialysis Orders:  Assessment/Plan **AKI on CKD 4:  Baseline creatinine in the 2+ range of late - was at baseline on presentation but developed AKI mid hospitalization.  No hypotension.  The only potential insult I see is contrast administration on 11/5 which temporally fits with the AKI.  She does have a UTI that is being treated but no evidence of AIN and I wouldn't change antibiotics.   Her UOP is adequate. Labs showing Cr rising very slowly but labs haven't been obtained for today.  Hopefully we will see her renal function start to improve imminently - I don't see any ongoing insults that we could correct.  Given her refusal of multiple interventions and diagnostics in addition to her poor baseline functional status she wouldn't be a candidate for dialysis and she is aware.     **Multiorganism UTI:  Antibiotics per primary - I don't think they need to be changed as I don't suspect AIN.  **Anemia:  S/p transfusion - GI eval, no frank bleeding.  Further transfusion per primary.  Follows with hematology for leukocytosis, perhaps an ESA could be considered if Hb trending down but it's stable in the 9s at the moment.  **Metabolic acidosis: secondary to AKI, ^ po bicarb.    **Hyponatremia: ?SIADH.  Nadir 126 and improved to 138 on last check  **Cirrhosis: new dx based on imaging here.  On lactulose and rifaxin. Outpt f/u planned.  I explored GOC with her again yesterday- she was still noncommital; I think palliative care may be valuable - she has lots of comorbids and is high risk for repeated admission and global decline.   She is DNR.  She would not be a candidate for dialysis per above.    Our interaction today again would support a palliative approach but she's not willing to have a specific conversation to that effect.  She certainly is going that path by her interactions and treatment refusals.    Jannifer Hick  MD 04/13/2020, 2:16 PM  [facility] Pager: [phone]

## 2020-04-13 NOTE — Progress Notes (Signed)
PROGRESS NOTE    Sarah Colon  EAV:[id number] DOB: 1945/09/16 DOA: 03/12/2020 PCP: Patient, No Pcp Per    Chief Complaint  Patient presents with  . Cough  . Shortness of Breath    Brief Narrative:  The patient is a 74 year old African-American female with a past medical history significant for but not limited to diabetes mellitus type 2, diastolic CHF, hypertension, paroxysmal atrial fibrillation, chronic kidney disease stage IV with a baseline creatinine of 1.5-1.8 as well as other comorbidities who presented to the ED with shortness of breath, chest pain, fatigue over the last week.  She also reported a poor appetite.  She is found to have a hemoglobin of 3 on admission.  There are no reports of blood in her stool or dark or tarry stools.  Fecal occult test in the ED was positive.  GI was consulted and she was admitted to the hospitalist service.  She normally lives in SNF is nonambulatory and in the wheelchair.  She underwent EGD and colonoscopy without evidence of any bleeding.  She had multiple polyp removal from her colonoscopy today.  Her prep was poor.  GI recommending resuming Xarelto 04/03/20 and she received another dose of diuresis yesterday given her volume overload.  Her Xarelto is now resumed today but this evening on 04/04/2019 when she spiked a temperature and she does have a leukocytosis which is chronic but we will panculture to rule out infection.  She is complaining of some abdominal pain so we will get a CT of the abdomen pelvis.  Chest x-ray shows possible pneumonia so she was started on Unasyn and SLP was consulted given the location being the Right Lung Base.  **After further workup it appears the patient has a UTI with Providencia and Entercoccus Faecalis. We will escalate her antibiotics to IV meropenem and hold off of Vancomycin for now. Repeat CT scan showed persistent mild perirectal and presacral fat stranding of unclear etiology and presacral rectal findings  associate with cortical erosions, sclerosis and thinning of the S1 vertebral body. As well as the urinary bladder that was thickened circumferentially. Hemoglobin has dropped a little bit GI feels that the CT findings are in the setting of fecal impaction and due to stool inflammation from the fecal impaction. They feel that her anemia worsens and she will need a flex sig with enemas for prep prior to discharge to reevaluate the rectum after resolution of the fecal impaction. We will escalate antibiotics given her UTI.  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  Her renal function continues to worsen and we are doing further work-up will obtain a bladder scan, renal ultrasound and started the patient back on gentle IV fluids with normal saline at 25 mils per hour.  We are to place a Foley catheter for strict I's and O's however patient refused this.  We will continue monitor and will notify nephrology about her worsening renal function.  Nephrology feels that Contrast from last CT scan caused her AKI and GI has signed off and feel as if her Abdominal Pain is multifactorial. They are going to hold off on Flex Sigmoidoscopy.     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  Currently on heparin.  FOBT which was done was positive.  Patient status post  transfusion of 3 units packed red blood cells.  Hemoglobin on admission was 3.1 and currently at 9.1.  Patient with no overt bleeding.  Patient seen in consultation by GI underwent upper endoscopy 04/14/2020 without any significant findings or source of GI bleed.  Patient underwent rescheduled colonoscopy 04/24/2020 with poor prep and showed 2 to 5 mm polyps in the cecum, 3 5-9MM polyps in the transverse colon which were removed.  Biopsies consistent with tubular adenoma without high-grade dysplasia.  Due to poor prep GI recommending repeat colonoscopy in 6 months.  Patient currently on a soft diet.  Unable to resume Xarelto due to renal function as such Eliquis started at 2.5 mg twice daily.  CT abdomen and pelvis was done which showed focal rectal wall thickening, acute cellular fluid as well as circumferential thickened bladder.  Leukocytosis trending back up.  GI was reconsulted and patient given a soapsuds enema due to concerns for fecal impaction..  Per GI if continued drop in hemoglobin will need a flex sigmoidoscopy.  Currently holding off on flex sigmoidoscopy.  GI has signed off.  Follow H&H.  Transfusion threshold hemoglobin <7.  2.  Left lower quadrant abdominal pain/fecal impaction CT abdomen and pelvis done without any acute findings.  Patient noted to have had a fever with a leukocytosis.  Fever likely secondary from UTI.  CT with focal rectal wall thickening of acute cellular fluid as well as circumferentially thickened bladder.  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  On empiric IV antibiotics.  Patient was seen in consultation by GI who felt patient had a component of fecal impaction causing her abdominal pain.  Soapsuds enema was ordered early on during the hospitalization.  Patient with complaints of diffuse abdominal pain and stated has not had a bowel movement in the several days.  Episodes enema.  Patient having bowel movements.  Patient on bowel regimen of lactulose twice  daily, MiraLAX 3 times daily.  Patient refusing MiraLAX at this point.  Change MiraLAX to as needed.  Follow.    3.  Liver cirrhosis New diagnosis.  MRI of abdomen was degraded exam however no concerning lesions noted repeat MRI recommended which can be done in the outpatient setting.  Continue Xifaxan, lactulose.  Outpatient follow-up with GI.  4.  Acute metabolic encephalopathy Likely secondary to symptomatic profound anemia.  Ammonia levels within normal limits.  Encephalopathy resolved and currently at baseline.  5.  Acute renal failure on chronic kidney disease stage IV Baseline creatinine approximately 2.  Patient seen in consultation by nephrology who do not feel patient has any evidence of AIN and do not recommend any change in antibiotics.  Patient could likely have developed acute insult on chronic kidney disease stage IV from contrast nephropathy from CT done 04/04/2020.  Creatinine currently at 4.82 from 4.86 from 4.50 from 4.36(04/09/2020) from 4.28 from 4.07 from 3.76 from 3.62 from 2.95 from 1.87 from 1.41 from 2.30 on admission.  300 cc recorded over the past 24 hours.  Nephrology does not feel patient is  a good long-term dialysis candidate but  discussed with patient for temporary dialysis if needed.  Patient developing some lower extremity edema.  IV fluids have been saline locked per nephrology.  Gabapentin dose decreased to 300 mg nightly.  Patient had midline placed yesterday however unable to draw labs this morning from midline.  Patient agreed for labs to be drawn which was done today.  Hopefully renal function is plateauing and hopefully will start to trend down.  Per nephrology.    6.  Providencia stuartii and VRE UTI Status post IV antibiotics.  Supportive care.  Follow.     7.  Metabolic acidosis Likely secondary to acute kidney injury.  Patient started on bicarb tablets per nephrology.  Follow.    8.  Hyponatremia ??  SIADH.  Patient on 1200 cc fluid restriction per  nephrology.  Hyponatremia improved currently at 133.  Patient noted to have some lower extremity edema.  Fluids have been saline lock.  Follow.   9.  Hyperkalemia Resolved with Lokelma.  Now with hypokalemia.  10.  Acute on chronic diastolic CHF Patient noted during the hospitalization early on to be volume overloaded on examination with lower extremity edema.  BNP elevated at 330 however patient with chronic kidney disease.  Pleural effusions noted on chest x-ray.  Patient status post intermittent diuresis with clinical improvement.  Patient with no respiratory symptoms.  Patient with lower extremity edema.  Follow.   11.  Hypothyroidism Continue Synthroid.    12.  Well-controlled diabetes mellitus type 2 Hemoglobin A1c 5.6.  CBG of 97 this morning.  Sliding scale insulin.  13.  Paroxysmal atrial fibrillation Continue beta-blocker for rate control.  Hemoglobin currently stable at 9.3.  Unable to resume Xarelto due to renal function.  Eliquis started at 2.5 mg twice daily.    14.  Hypokalemia K. Dur 40 mEq p.o. x1.  15.  Leukocytosis Questionable etiology.  Patient afebrile.  Status post full course of antibiotic treatment for UTI.  Patient with no respiratory symptoms.  Hold off on any further antibiotics at this time.  Follow.  16. 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 Subcutaneous fat may be visible but bone, tendon or muscle are NOT exposed. (Active)  04/12/2020 1149  Location: Sacrum  Location Orientation: Medial  Staging: Stage III -  Full thickness tissue loss. Subcutaneous fat may be visible but bone, tendon or muscle are NOT exposed.  Wound Description (Comments):   Present on Admission: Yes         DVT prophylaxis: SCDs>>> Heparin>>> Eliquis Code Status: DNR Family Communication: Updated patient.  No family at bedside. Disposition:   Status is: Inpatient    Dispo: The patient is from: SNF               Anticipated d/c is to: SNF              Anticipated d/c date is: To be determined.  When cleared by nephrology.              Patient currently with acute on chronic kidney disease stage IV, being followed by nephrology, status post IV antibiotics for UTI.  Anemia status post transfusion.  Not stable for discharge.        Consultants:   Nephrology: Dr. Johnney Ou 04/07/2020  Gastroenterology: Dr. Alessandra Bevels 03/30/2020  Procedures:   Transfuse 3 units packed red blood cells 03/27/2020--04/06/2020  Colonoscopy 04/19/2020--per Dr. Alessandra Bevels  Upper endoscopy 04/29/2020--by  Dr. Alessandra Bevels  CT abdomen and pelvis 04/04/2020, 03/19/2020  CT pelvis 03/06/2020  Chest x-ray 03/13/2020, 03/30/2020, 04/03/2020  MRI liver 04/01/2020  Renal ultrasound 04/06/2020  2D echo 03/30/2020  Antimicrobials:  Anti-infectives (From admission, onward)   Start     Dose/Rate Route Frequency Ordered Stop   04/06/20 1430  piperacillin-tazobactam (ZOSYN) IVPB 2.25 g        2.25 g 100 mL/hr over 30 Minutes Intravenous Every 6 hours 04/06/20 1320 04/11/20 2359   04/05/20 1930  meropenem (MERREM) 1 g in sodium chloride 0.9 % 100 mL IVPB  Status:  Discontinued        1 g 200 mL/hr over 30 Minutes Intravenous Every 12 hours 04/05/20 1832 04/06/20 1320   04/04/20 1000  Ampicillin-Sulbactam (UNASYN) 3 g in sodium chloride 0.9 % 100 mL IVPB  Status:  Discontinued        3 g 200 mL/hr over 30 Minutes Intravenous Every 12 hours 04/04/20 0851 04/05/20 1829   03/30/20 1400  rifaximin (XIFAXAN) tablet 550 mg        550 mg Oral 2 times daily 03/30/20 1136         Subjective: Patient laying in bed.  States some improvement with abdominal pain.  Having bowel movement.  Denies chest pain or shortness of breath.  Midline placed yesterday however unable to draw labs.  Patient stated was in agreement and got labs drawn later on this morning.    Objective: Vitals:   04/12/20 1332 04/12/20 2216 04/12/20 2242 04/13/20 0536    BP: (!) 163/85 138/71 138/71 (!) 129/52  Pulse: 83 (!) 115 96 84  Resp: (!) 22 18 18 18   Temp: 98.5 F (36.9 C) 98.8 F (37.1 C) 98.8 F (37.1 C) 98.8 F (37.1 C)  TempSrc: Oral Oral  Oral  SpO2: 100% 100% 100% 99%  Weight:    67.2 kg  Height:        Intake/Output Summary (Last 24 hours) at 04/13/2020 1215 Last data filed at 04/12/2020 2200 Gross per 24 hour  Intake 480 ml  Output 300 ml  Net 180 ml   Filed Weights   04/11/20 0300 04/12/20 0500 04/13/20 0536  Weight: 65.9 kg 66.8 kg 67.2 kg    Examination:  General exam: NAD Respiratory system: CTA B.  No wheezes, no crackles, no rhonchi.  Normal respiratory effort.   Cardiovascular system: Regular rate rhythm no murmurs rubs or gallops.  No JVD.  1-2+ bilateral lower extremity edema.  Gastrointestinal system: Abdomen is soft, decreased diffuse tenderness to palpation, less distended, positive bowel sounds.  No rebound.  No guarding.  Central nervous system: Alert and oriented. No focal neurological deficits. Extremities: Symmetric 5 x 5 power. Skin: No rashes, lesions or ulcers Psychiatry: Judgement and insight appear normal. Mood & affect appropriate.     Data Reviewed: I have personally reviewed following labs and imaging studies  CBC: Recent Labs  Lab 04/07/20 0450 04/07/20 1537 04/08/20 0516 04/09/20 0621 04/11/20 0427 04/12/20 1130 04/13/20 1031  WBC 14.4*  --  11.0* 12.1* 14.7* 15.8* 18.3*  NEUTROABS 10.0*  --  8.1* 7.8*  --   --   --   HGB 8.0*   < > 9.2* 9.1* 9.1* 9.1* 9.3*  HCT 25.8*   < > 29.6* 28.5* 28.6* 28.4* 28.9*  MCV 80.4  --  80.9 79.2* 77.7* 76.8* 77.1*  PLT 251  --  322 353 472* 602* 689*   < > = values in this interval  not displayed.    Basic Metabolic Panel: Recent Labs  Lab 04/07/20 0450 04/07/20 1537 04/08/20 0516 04/09/20 0621 04/11/20 0427 04/12/20 1130 04/13/20 1031  NA 126*   < > 128*  128* 133* 138 138 133*  K 4.1   < > 4.3  4.3 3.9 3.2* 3.2* 3.1*  CL 98   < >  100  100 104 110 108 105  CO2 18*   < > 16*  17* 17* 15* 17* 19*  GLUCOSE 98   < > 84  84 100* 104* 108* 123*  BUN 43*   < > 51*  50* 50* 46* 46* 46*  CREATININE 3.62*   < > 4.28*  4.07* 4.36* 4.50* 4.86* 4.82*  CALCIUM 7.5*   < > 8.0*  8.0* 7.8* 8.1* 8.0* 7.8*  MG 2.0  --  2.1  --   --   --   --   PHOS 5.4*  --  6.9*  7.1* 7.0* 6.2* 6.4* 5.9*   < > = values in this interval not displayed.    GFR: Estimated Creatinine Clearance: 8.8 mL/min (A) (by C-G formula based on SCr of 4.82 mg/dL (H)).  Liver Function Tests: Recent Labs  Lab 04/07/20 0450 04/07/20 0450 04/08/20 0516 04/09/20 0621 04/11/20 0427 04/12/20 1130 04/13/20 1031  AST 36  --  44*  --   --   --   --   ALT 21  --  26  --   --   --   --   ALKPHOS 107  --  122  --   --   --   --   BILITOT 0.7  --  1.0  --   --   --   --   PROT 5.7*  --  5.9*  --   --   --   --   ALBUMIN 2.4*   < > 2.4*  2.3* 2.1* 2.2* 2.3* 2.3*   < > = values in this interval not displayed.    CBG: Recent Labs  Lab 04/12/20 1246 04/12/20 1641 04/12/20 2221 04/13/20 0732 04/13/20 1115  GLUCAP 100* 134* 148* 97 117*     Recent Results (from the past 240 hour(s))  Culture, blood (Routine X 2) w Reflex to ID Panel     Status: None   Collection Time: 04/04/20  6:25 AM   Specimen: BLOOD  Result Value Ref Range Status   Specimen Description   Final    BLOOD RIGHT ANTECUBITAL Performed at [facility], [place] [address], [place], [place] [address]    Special Requests   Final    BOTTLES DRAWN AEROBIC ONLY Blood Culture adequate volume Performed at [place] [address] [place], [place] [address]    Culture   Final    NO GROWTH 5 DAYS Performed at [facility], [place] [address] [place], [place] [address]    Report Status 04/09/2020 FINAL  Final  Culture, blood (Routine X 2) w Reflex to ID Panel     Status: None   Collection Time: 04/04/20  6:25 AM   Specimen: BLOOD RIGHT HAND   Result Value Ref Range Status   Specimen Description   Final    BLOOD RIGHT HAND Performed at [place] [address], [place], [place] [address]    Special Requests   Final    BOTTLES DRAWN AEROBIC AND ANAEROBIC Blood Culture adequate volume Performed at [facility], [address] [place], [place] [address]    Culture   Final    NO GROWTH 5 DAYS Performed at [facility], [place] [address], [place], [place] [address]    Report Status 04/09/2020 FINAL  Final  Culture, Urine     Status: Abnormal   Collection Time: 04/04/20  3:08 PM   Specimen: Urine, Random  Result Value Ref Range Status   Specimen Description   Final    URINE, RANDOM Performed at [facility] [address], [place], [place] [address]    Special Requests   Final    NONE Performed at [facility], [place] [address], [place], [place] [address]    Culture (A)  Final    70,000 COLONIES/mL PROVIDENCIA STUARTII >=100,000 COLONIES/mL VANCOMYCIN RESISTANT ENTEROCOCCUS    Report Status 04/06/2020 FINAL  Final   Organism ID, Bacteria PROVIDENCIA STUARTII (A)  Final   Organism ID, Bacteria VANCOMYCIN RESISTANT ENTEROCOCCUS (A)  Final      Susceptibility   Providencia stuartii - MIC*    AMPICILLIN >=32 RESISTANT Resistant     CEFAZOLIN >=64 RESISTANT Resistant     CEFEPIME 0.25 SENSITIVE Sensitive     CEFTRIAXONE >=64 RESISTANT Resistant     CIPROFLOXACIN >=4 RESISTANT Resistant     GENTAMICIN RESISTANT Resistant     IMIPENEM 2 SENSITIVE Sensitive     NITROFURANTOIN 128 RESISTANT Resistant     TRIMETH/SULFA 160 RESISTANT Resistant     AMPICILLIN/SULBACTAM >=32 RESISTANT Resistant     PIP/TAZO <=4 SENSITIVE Sensitive     * 70,000 COLONIES/mL PROVIDENCIA STUARTII   Vancomycin resistant enterococcus - MIC*    AMPICILLIN <=2 SENSITIVE Sensitive     NITROFURANTOIN <=16 SENSITIVE Sensitive     VANCOMYCIN >=32 RESISTANT Resistant      LINEZOLID 2 SENSITIVE Sensitive     * >=100,000 COLONIES/mL VANCOMYCIN RESISTANT ENTEROCOCCUS         Radiology Studies: [place] EKG SITE RITE  Result Date: 04/13/2020 If Site Rite image not attached, placement could not be confirmed due to current cardiac rhythm.  [place] EKG SITE RITE  Result Date: 04/12/2020 If Site Rite image not attached, placement could not be confirmed due to current cardiac rhythm.       Scheduled Meds: . apixaban  2.5 mg Oral BID  . diltiazem  120 mg Oral Daily  . gabapentin  300 mg Oral QHS  . guaiFENesin  1,200 mg Oral BID  . insulin aspart  0-9 Units Subcutaneous TID WC  . lactulose  10 g Oral BID  . levothyroxine  88 mcg Oral Q0600  . metoprolol tartrate  25 mg Oral BID  . pantoprazole  40 mg Oral Daily  . polyethylene glycol  17 g Oral BID  . potassium chloride  40 mEq Oral Once  . rifaximin  550 mg Oral BID  . sodium bicarbonate  1,300 mg Oral TID  . sodium chloride flush  10-40 mL Intracatheter Q12H  . sodium chloride flush  3 mL Intravenous Q12H   Continuous Infusions: . sodium chloride Stopped (04/01/20 0906)  . albumin human       LOS: 15 days    Time spent: 35 mins    Irine Seal, MD [facility]   To contact the attending provider between 7A-7P or the covering provider during after hours 7P-7A, please log into the web site [email or web address] and access using universal [place] password for that web site. If you do not have the  password, please call the hospital operator.  04/13/2020, 12:15 PM

## 2020-04-13 NOTE — Consult Note (Signed)
WOC Nurse Consult Note: Reason for Consult:Ongoing stage 3 sacral pressure injury  Patient with anasarca and skin is frequently moist.  Currently with UTI and noted weakness.  Lesion is worsened due to debility, and exposure to moisture.  Wound type:stage 3 pressure injury  Pressure Injury POA: Yes Measurement:2 cm x 2 cm x 0.2 cm  Wound bed:red, tender and moist Drainage (amount, consistency, odor) minimal serosanguinous  No odor Periwound:moist Dressing procedure/placement/frequency: Cleanse sacral wound with NS and pat dry. Apply aglinate to wound bed. Cover with silicone foam. Change every other day and PRN soilage.  Will not follow at this time.  Please re-consult if needed.  Domenic Moras MSN, RN, FNP-BC CWON Wound, Ostomy, Continence Nurse Pager [phone]

## 2020-04-14 DIAGNOSIS — D649 Anemia, unspecified: Secondary | ICD-10-CM | POA: Diagnosis not present

## 2020-04-14 DIAGNOSIS — Z515 Encounter for palliative care: Secondary | ICD-10-CM

## 2020-04-14 DIAGNOSIS — I5033 Acute on chronic diastolic (congestive) heart failure: Secondary | ICD-10-CM | POA: Diagnosis not present

## 2020-04-14 DIAGNOSIS — I5032 Chronic diastolic (congestive) heart failure: Secondary | ICD-10-CM | POA: Diagnosis not present

## 2020-04-14 DIAGNOSIS — N179 Acute kidney failure, unspecified: Secondary | ICD-10-CM | POA: Diagnosis not present

## 2020-04-14 DIAGNOSIS — Z7189 Other specified counseling: Secondary | ICD-10-CM

## 2020-04-14 DIAGNOSIS — E039 Hypothyroidism, unspecified: Secondary | ICD-10-CM | POA: Diagnosis not present

## 2020-04-14 DIAGNOSIS — N289 Disorder of kidney and ureter, unspecified: Secondary | ICD-10-CM | POA: Diagnosis not present

## 2020-04-14 LAB — CBC WITH DIFFERENTIAL/PLATELET
Abs Immature Granulocytes: 0.15 10*3/uL — ABNORMAL HIGH (ref 0.00–0.07)
Basophils Absolute: 0.1 10*3/uL (ref 0.0–0.1)
Basophils Relative: 1 %
Eosinophils Absolute: 0.8 10*3/uL — ABNORMAL HIGH (ref 0.0–0.5)
Eosinophils Relative: 5 %
HCT: 25.2 % — ABNORMAL LOW (ref 36.0–46.0)
Hemoglobin: 8.4 g/dL — ABNORMAL LOW (ref 12.0–15.0)
Immature Granulocytes: 1 %
Lymphocytes Relative: 23 %
Lymphs Abs: 3.7 10*3/uL (ref 0.7–4.0)
MCH: 25.5 pg — ABNORMAL LOW (ref 26.0–34.0)
MCHC: 33.3 g/dL (ref 30.0–36.0)
MCV: 76.6 fL — ABNORMAL LOW (ref 80.0–100.0)
Monocytes Absolute: 1.3 10*3/uL — ABNORMAL HIGH (ref 0.1–1.0)
Monocytes Relative: 8 %
Neutro Abs: 9.9 10*3/uL — ABNORMAL HIGH (ref 1.7–7.7)
Neutrophils Relative %: 62 %
Platelets: 652 10*3/uL — ABNORMAL HIGH (ref 150–400)
RBC: 3.29 MIL/uL — ABNORMAL LOW (ref 3.87–5.11)
RDW: 29.6 % — ABNORMAL HIGH (ref 11.5–15.5)
WBC: 16 10*3/uL — ABNORMAL HIGH (ref 4.0–10.5)
nRBC: 0 % (ref 0.0–0.2)

## 2020-04-14 LAB — GLUCOSE, CAPILLARY
Glucose-Capillary: 104 mg/dL — ABNORMAL HIGH (ref 70–99)
Glucose-Capillary: 145 mg/dL — ABNORMAL HIGH (ref 70–99)
Glucose-Capillary: 126 mg/dL — ABNORMAL HIGH (ref 70–99)
Glucose-Capillary: 142 mg/dL — ABNORMAL HIGH (ref 70–99)

## 2020-04-14 NOTE — Progress Notes (Signed)
Patient refused AM labs

## 2020-04-14 NOTE — Progress Notes (Signed)
PROGRESS NOTE    Sarah Colon  GYI:[id number] DOB: 04-Sep-1945 DOA: 03/19/2020 PCP: Patient, No Pcp Per    Chief Complaint  Patient presents with  . Cough  . Shortness of Breath    Brief Narrative:  The patient is a 74 year old African-American female with a past medical history significant for but not limited to diabetes mellitus type 2, diastolic CHF, hypertension, paroxysmal atrial fibrillation, chronic kidney disease stage IV with a baseline creatinine of 1.5-1.8 as well as other comorbidities who presented to the ED with shortness of breath, chest pain, fatigue over the last week.  She also reported a poor appetite.  She is found to have a hemoglobin of 3 on admission.  There are no reports of blood in her stool or dark or tarry stools.  Fecal occult test in the ED was positive.  GI was consulted and she was admitted to the hospitalist service.  She normally lives in SNF is nonambulatory and in the wheelchair.  She underwent EGD and colonoscopy without evidence of any bleeding.  She had multiple polyp removal from her colonoscopy today.  Her prep was poor.  GI recommending resuming Xarelto 04/03/20 and she received another dose of diuresis yesterday given her volume overload.  Her Xarelto is now resumed today but this evening on 04/04/2019 when she spiked a temperature and she does have a leukocytosis which is chronic but we will panculture to rule out infection.  She is complaining of some abdominal pain so we will get a CT of the abdomen pelvis.  Chest x-ray shows possible pneumonia so she was started on Unasyn and SLP was consulted given the location being the Right Lung Base.  **After further workup it appears the patient has a UTI with Providencia and Entercoccus Faecalis. We will escalate her antibiotics to IV meropenem and hold off of Vancomycin for now. Repeat CT scan showed persistent mild perirectal and presacral fat stranding of unclear etiology and presacral rectal findings  associate with cortical erosions, sclerosis and thinning of the S1 vertebral body. As well as the urinary bladder that was thickened circumferentially. Hemoglobin has dropped a little bit GI feels that the CT findings are in the setting of fecal impaction and due to stool inflammation from the fecal impaction. They feel that her anemia worsens and she will need a flex sig with enemas for prep prior to discharge to reevaluate the rectum after resolution of the fecal impaction. We will escalate antibiotics given her UTI.  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  Her renal function continues to worsen and we are doing further work-up will obtain a bladder scan, renal ultrasound and started the patient back on gentle IV fluids with normal saline at 25 mils per hour.  We are to place a Foley catheter for strict I's and O's however patient refused this.  We will continue monitor and will notify nephrology about her worsening renal function.  Nephrology feels that Contrast from last CT scan caused her AKI and GI has signed off and feel as if her Abdominal Pain is multifactorial. They are going to hold off on Flex Sigmoidoscopy.     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  Currently on heparin.  FOBT which was done was positive.  Patient status post  transfusion of 3 units packed red blood cells.  Hemoglobin on admission was 3.1 and currently at 9.1.  Patient with no overt bleeding.  Patient seen in consultation by GI underwent upper endoscopy 04/07/2020 without any significant findings or source of GI bleed.  Patient underwent rescheduled colonoscopy 04/21/2020 with poor prep and showed 2 to 5 mm polyps in the cecum, 3 5-9MM polyps in the transverse colon which were removed.  Biopsies consistent with tubular adenoma without high-grade dysplasia.  Due to poor prep GI recommending repeat colonoscopy in 6 months.  Patient currently on a soft diet.  Unable to resume Xarelto due to renal function as such Eliquis started at 2.5 mg twice daily.  CT abdomen and pelvis was done which showed focal rectal wall thickening, acute cellular fluid as well as circumferential thickened bladder.  Leukocytosis initially was trending back up and started to trend back down.  Patient afebrile.  GI was reconsulted and patient given a soapsuds enema due to concerns for fecal impaction..  Per GI if continued drop in hemoglobin will need a flex sigmoidoscopy.  Currently holding off on flex sigmoidoscopy.  GI has signed off.  Hemoglobin currently at 8.4.  Follow.   2.  Left lower quadrant abdominal pain/fecal impaction CT abdomen and pelvis done without any acute findings.  Patient noted to have had a fever with a leukocytosis.  Fever likely secondary from UTI.  CT with focal rectal wall thickening of acute cellular fluid as well as circumferentially thickened bladder.  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  On empiric IV antibiotics.  Patient was seen in consultation by GI who felt patient had a component of fecal impaction causing her abdominal pain.  Soapsuds enema was ordered early on during the hospitalization.  Patient with complaints of diffuse abdominal pain and initially noted not to have had a bowel movement in several days.  Patient status post enema and  having bowel movements.  Continue current regimen of lactulose.  MiraLAX changed to as needed as patient noted to be refusing.  Supportive care.  Follow.  3.  Liver cirrhosis New diagnosis.  MRI of abdomen was degraded exam however no concerning lesions noted repeat MRI recommended which can be done in the outpatient setting.  Continue lactulose, Xifaxan.  Outpatient follow-up with GI.   4.  Acute metabolic encephalopathy Likely secondary to symptomatic profound anemia.  Ammonia levels within normal limits.  Encephalopathy resolved and currently at baseline.  5.  Acute renal failure on chronic kidney disease stage IV Baseline creatinine approximately 2.  Patient seen in consultation by nephrology who do not feel patient has any evidence of AIN and do not recommend any change in antibiotics.  Patient could likely have developed acute insult on chronic kidney disease stage IV from contrast nephropathy from CT done 04/04/2020.  Labs pending for today.  Last creatinine 4.82 from 4.86 from 4.50 from 4.36(04/09/2020) from 4.28 from 4.07 from 3.76 from 3.62 from 2.95 from 1.87 from 1.41 from 2.30 on admission.  Urine output of 250  cc recorded over the past 24 hours.  Nephrology does not feel patient is a good long-term dialysis candidate but  discussed with patient for temporary dialysis if needed.  Patient with lower extremity edema which is unchanged.  Fluids have been saline locked.  Midline placed however unable to draw labs.  Midline discontinued as patient with right upper extremity swelling.  Hopefully renal function is plateauing and hopefully start to trend down.  Per nephrology.    6.  Providencia stuartii and VRE UTI Status post IV antibiotics.  Supportive care.  Follow.     7.  Metabolic acidosis Likely secondary to acute kidney injury.  Improving on bicarb tablets.  Follow.     8.  Hyponatremia ??  SIADH.  Patient on 1200 cc fluid restriction per nephrology.  Hyponatremia improved with last  sodium at 133.    9.  Hyperkalemia Resolved with Lokelma.  Now with hypokalemia.  10.  Acute on chronic diastolic CHF Patient noted during the hospitalization early on to be volume overloaded on examination with lower extremity edema.  BNP elevated at 330 however patient with chronic kidney disease.  Pleural effusions noted on chest x-ray.  Patient status post intermittent diuresis with clinical improvement.  Patient with no respiratory symptoms.  Patient with lower extremity edema.  IV fluids have been saline lock.  Follow.   11.  Hypothyroidism Synthroid.   12.  Well-controlled diabetes mellitus type 2 Hemoglobin A1c 5.6.  CBG 104 this morning.  Sliding scale insulin.   13.  Paroxysmal atrial fibrillation Continue beta-blocker for rate control.  Hemoglobin stable at 8.4.  Due to renal function Xarelto not resumed.  Continue current dose Eliquis 2.5 mg twice daily.   14.  Hypokalemia Labs pending for today.   15.  Leukocytosis Questionable etiology.  Patient afebrile.  Status post full course of antibiotic treatment for UTI.  Patient with no respiratory symptoms.  Leukocytosis trending down.  No need for any further antibiotics at this time.    16. Sacral ulcer, POA Pressure Injury 04/25/2020 Sacrum Medial Stage III -  Full thickness tissue loss. Subcutaneous fat may be visible but bone, tendon or muscle are NOT exposed. (Active)  04/03/2020 1149  Location: Sacrum  Location Orientation: Medial  Staging: Stage III -  Full thickness tissue loss. Subcutaneous fat may be visible but bone, tendon or muscle are NOT exposed.  Wound Description (Comments):   Present on Admission: Yes         DVT prophylaxis: Eliquis Code Status: DNR Family Communication: Updated patient.  No family at bedside. Disposition:   Status is: Inpatient    Dispo: The patient is from: SNF              Anticipated d/c is to: SNF              Anticipated d/c date is: To be determined.  When cleared by  nephrology.              Patient currently with acute on chronic kidney disease stage IV, being followed by nephrology, status post IV antibiotics for UTI.  Anemia status post transfusion.  Not stable for discharge.        Consultants:   Nephrology: Dr. Johnney Ou 04/07/2020  Gastroenterology: Dr. Alessandra Bevels 03/30/2020  Palliative care pending  Procedures:   Transfuse 3 units packed red blood cells 03/04/2020--04/03/2020  Colonoscopy 04/04/2020--per Dr. Alessandra Bevels  Upper endoscopy 04/24/2020--by Dr. Alessandra Bevels  CT abdomen and pelvis 04/04/2020, 03/01/2020  CT pelvis 03/20/2020  Chest x-ray 03/27/2020, 03/30/2020, 04/03/2020  MRI liver 04/01/2020  Renal ultrasound 04/06/2020  2D echo 03/30/2020  Antimicrobials:  Anti-infectives (From admission, onward)   Start     Dose/Rate Route Frequency Ordered Stop   04/06/20 1430  piperacillin-tazobactam (ZOSYN) IVPB 2.25 g        2.25 g 100 mL/hr over 30 Minutes Intravenous Every 6 hours 04/06/20 1320 04/11/20 2359   04/05/20 1930  meropenem (MERREM) 1 g in sodium chloride 0.9 % 100 mL IVPB  Status:  Discontinued        1 g 200 mL/hr over 30 Minutes Intravenous Every 12 hours 04/05/20 1832 04/06/20 1320   04/04/20 1000  Ampicillin-Sulbactam (UNASYN) 3 g in sodium chloride 0.9 % 100 mL IVPB  Status:  Discontinued        3 g 200 mL/hr over 30 Minutes Intravenous Every 12 hours 04/04/20 0851 04/05/20 1829   03/30/20 1400  rifaximin (XIFAXAN) tablet 550 mg        550 mg Oral 2 times daily 03/30/20 1136         Subjective: Patient sleeping but arousable.  Denies any chest pain or shortness of breath.  Overall she feels she is improving.  States abdominal pain slowly improving.  Objective: Vitals:   04/13/20 2024 04/14/20 0347 04/14/20 0540 04/14/20 1006  BP: (!) 154/64  (!) 117/56 (!) 129/52  Pulse: 78  71 76  Resp: 18  18   Temp: 98.3 F (36.8 C)  98.2 F (36.8 C)   TempSrc: Oral  Oral   SpO2: 100%  99%   Weight:  68.6 kg      Height:        Intake/Output Summary (Last 24 hours) at 04/14/2020 1118 Last data filed at 04/13/2020 1812 Gross per 24 hour  Intake 640 ml  Output 250 ml  Net 390 ml   Filed Weights   04/12/20 0500 04/13/20 0536 04/14/20 0347  Weight: 66.8 kg 67.2 kg 68.6 kg    Examination:  General exam: NAD Respiratory system: Lungs clear to auscultation bilaterally.  No wheezes, no crackles, no rhonchi.  Normal respiratory effort.   Cardiovascular system: RRR no murmurs rubs or gallops.  No JVD.  1-2+ bilateral lower extremity edema.  Gastrointestinal system: Abdomen is soft, less distended, positive bowel sounds, less tender to palpation, no rebound, no guarding. Central nervous system: Alert and oriented. No focal neurological deficits. Extremities: Right upper extremity swelling where prior midline was.  Skin: No rashes, lesions or ulcers Psychiatry: Judgement and insight appear normal. Mood & affect appropriate.     Data Reviewed: I have personally reviewed following labs and imaging studies  CBC: Recent Labs  Lab 04/08/20 0516 04/08/20 0516 04/09/20 0621 04/11/20 0427 04/12/20 1130 04/13/20 1031 04/14/20 0931  WBC 11.0*   < > 12.1* 14.7* 15.8* 18.3* 16.0*  NEUTROABS 8.1*  --  7.8*  --   --   --  9.9*  HGB 9.2*   < > 9.1* 9.1* 9.1* 9.3* 8.4*  HCT 29.6*   < > 28.5* 28.6* 28.4* 28.9* 25.2*  MCV 80.9   < > 79.2* 77.7* 76.8* 77.1* 76.6*  PLT 322   < > 353 472* 602* 689* 652*   < > = values in this interval not displayed.    Basic Metabolic Panel: Recent Labs  Lab 04/08/20 0516 04/09/20 0621 04/11/20 0427 04/12/20 1130 04/13/20 1031  NA 128*  128* 133* 138 138 133*  K 4.3  4.3 3.9 3.2* 3.2*  3.1*  CL 100  100 104 110 108 105  CO2 16*  17* 17* 15* 17* 19*  GLUCOSE 84  84 100* 104* 108* 123*  BUN 51*  50* 50* 46* 46* 46*  CREATININE 4.28*  4.07* 4.36* 4.50* 4.86* 4.82*  CALCIUM 8.0*  8.0* 7.8* 8.1* 8.0* 7.8*  MG 2.1  --   --   --   --   PHOS 6.9*  7.1* 7.0*  6.2* 6.4* 5.9*    GFR: Estimated Creatinine Clearance: 8.9 mL/min (A) (by C-G formula based on SCr of 4.82 mg/dL (H)).  Liver Function Tests: Recent Labs  Lab 04/08/20 0516 04/09/20 0621 04/11/20 0427 04/12/20 1130 04/13/20 1031  AST 44*  --   --   --   --   ALT 26  --   --   --   --   ALKPHOS 122  --   --   --   --   BILITOT 1.0  --   --   --   --   PROT 5.9*  --   --   --   --   ALBUMIN 2.4*  2.3* 2.1* 2.2* 2.3* 2.3*    CBG: Recent Labs  Lab 04/13/20 0732 04/13/20 1115 04/13/20 1622 04/13/20 2017 04/14/20 0844  GLUCAP 97 117* 124* 148* 104*     Recent Results (from the past 240 hour(s))  Culture, Urine     Status: Abnormal   Collection Time: 04/04/20  3:08 PM   Specimen: Urine, Random  Result Value Ref Range Status   Specimen Description   Final    URINE, RANDOM Performed at [facility], [place] [address], [place], [place] [address]    Special Requests   Final    NONE Performed at [facility], [place] [address], [place], [place] [address]    Culture (A)  Final    70,000 COLONIES/mL PROVIDENCIA STUARTII >=100,000 COLONIES/mL VANCOMYCIN RESISTANT ENTEROCOCCUS    Report Status 04/06/2020 FINAL  Final   Organism ID, Bacteria PROVIDENCIA STUARTII (A)  Final   Organism ID, Bacteria VANCOMYCIN RESISTANT ENTEROCOCCUS (A)  Final      Susceptibility   Providencia stuartii - MIC*    AMPICILLIN >=32 RESISTANT Resistant     CEFAZOLIN >=64 RESISTANT Resistant     CEFEPIME 0.25 SENSITIVE Sensitive     CEFTRIAXONE >=64 RESISTANT Resistant     CIPROFLOXACIN >=4 RESISTANT Resistant     GENTAMICIN RESISTANT Resistant     IMIPENEM 2 SENSITIVE Sensitive     NITROFURANTOIN 128 RESISTANT Resistant     TRIMETH/SULFA 160 RESISTANT Resistant     AMPICILLIN/SULBACTAM >=32 RESISTANT Resistant     PIP/TAZO <=4 SENSITIVE Sensitive     * 70,000 COLONIES/mL PROVIDENCIA STUARTII   Vancomycin resistant enterococcus - MIC*    AMPICILLIN <=2  SENSITIVE Sensitive     NITROFURANTOIN <=16 SENSITIVE Sensitive     VANCOMYCIN >=32 RESISTANT Resistant     LINEZOLID 2 SENSITIVE Sensitive     * >=100,000 COLONIES/mL VANCOMYCIN RESISTANT ENTEROCOCCUS         Radiology Studies: [place] EKG SITE RITE  Result Date: 04/13/2020 If Site Rite image not attached, placement could not be confirmed due to current cardiac rhythm.  [place] EKG SITE RITE  Result Date: 04/12/2020 If Site Rite image not attached, placement could not be confirmed due to current cardiac rhythm.       Scheduled Meds: . apixaban  2.5 mg Oral BID  . diltiazem  120  mg Oral Daily  . gabapentin  300 mg Oral QHS  . guaiFENesin  1,200 mg Oral BID  . insulin aspart  0-9 Units Subcutaneous TID WC  . lactulose  10 g Oral BID  . levothyroxine  88 mcg Oral Q0600  . metoprolol tartrate  25 mg Oral BID  . pantoprazole  40 mg Oral Daily  . rifaximin  550 mg Oral BID  . sodium bicarbonate  1,300 mg Oral TID  . sodium chloride flush  3 mL Intravenous Q12H   Continuous Infusions: . sodium chloride Stopped (04/01/20 0906)     LOS: 16 days    Time spent: 35 mins    Irine Seal, MD [facility]   To contact the attending provider between 7A-7P or the covering provider during after hours 7P-7A, please log into the web site [email or web address] and access using universal [place] password for that web site. If you do not have the password, please call the hospital operator.  04/14/2020, 11:18 AM

## 2020-04-14 NOTE — Progress Notes (Signed)
[facility] Progress Note   Subjective:   UOP 200- 300 cc/ day approx, creat stable today at 4.8. Pt is lethargic, arouses and answers simple questions.   Objective Vitals:   04/14/20 0347 04/14/20 0540 04/14/20 1006 04/14/20 1200  BP:  (!) 117/56 (!) 129/52 (!) 145/60  Pulse:  71 76 98  Resp:  18  20  Temp:  98.2 F (36.8 C)  (!) 97.4 F (36.3 C)  TempSrc:  Oral  Oral  SpO2:  99%  100%  Weight: 68.6 kg     Height:       Physical Exam  General: lying in bed comfortable, chronically ill and weak but nontoxic Lungs: normal WOB GU: purewick Ext: 2+  bilat LE edema, RUE 2+ edema Neuro: lethargic, awakens and responds approp, bed bound  Additional Objective Labs: Basic Metabolic Panel: Recent Labs  Lab 04/11/20 0427 04/12/20 1130 04/13/20 1031  NA 138 138 133*  K 3.2* 3.2* 3.1*  CL 110 108 105  CO2 15* 17* 19*  GLUCOSE 104* 108* 123*  BUN 46* 46* 46*  CREATININE 4.50* 4.86* 4.82*  CALCIUM 8.1* 8.0* 7.8*  PHOS 6.2* 6.4* 5.9*   Liver Function Tests: Recent Labs  Lab 04/08/20 0516 04/09/20 0621 04/11/20 0427 04/12/20 1130 04/13/20 1031  AST 44*  --   --   --   --   ALT 26  --   --   --   --   ALKPHOS 122  --   --   --   --   BILITOT 1.0  --   --   --   --   PROT 5.9*  --   --   --   --   ALBUMIN 2.4*  2.3*   < > 2.2* 2.3* 2.3*   < > = values in this interval not displayed.   No results for input(s): LIPASE, AMYLASE in the last 168 hours. CBC: Recent Labs  Lab 04/08/20 0516 04/08/20 0516 04/09/20 0621 04/09/20 0621 04/11/20 0427 04/11/20 0427 04/12/20 1130 04/13/20 1031 04/14/20 0931  WBC 11.0*   < > 12.1*   < > 14.7*   < > 15.8* 18.3* 16.0*  NEUTROABS 8.1*  --  7.8*  --   --   --   --   --  9.9*  HGB 9.2*   < > 9.1*   < > 9.1*   < > 9.1* 9.3* 8.4*  HCT 29.6*   < > 28.5*   < > 28.6*   < > 28.4* 28.9* 25.2*  MCV 80.9   < > 79.2*  --  77.7*  --  76.8* 77.1* 76.6*  PLT 322   < > 353   < > 472*   < > 602* 689* 652*   < > = values in  this interval not displayed.   Medications: . sodium chloride Stopped (04/01/20 0906)   . apixaban  2.5 mg Oral BID  . diltiazem  120 mg Oral Daily  . gabapentin  300 mg Oral QHS  . guaiFENesin  1,200 mg Oral BID  . insulin aspart  0-9 Units Subcutaneous TID WC  . lactulose  10 g Oral BID  . levothyroxine  88 mcg Oral Q0600  . metoprolol tartrate  25 mg Oral BID  . pantoprazole  40 mg Oral Daily  . rifaximin  550 mg Oral BID  . sodium bicarbonate  1,300 mg Oral TID  . sodium chloride flush  3 mL Intravenous Q12H  Dialysis Orders:  Assessment/Plan 1. AKI on CKD 4:  Baseline creatinine around 2, was at baseline on presentation but developed AKI here.  No hypotension.  The only potential insult was IV contrast administration on 11/5.  She does have a UTI that is being treated but no evidence of AIN.  Labs showing Cr rising very slowly. Stable creat 4.8 today.  Given her refusal of multiple interventions and diagnostics in addition to her poor baseline functional status she wouldn't be a candidate for dialysis and she is aware.    2. Multiorganism UTI:  Antibiotics per primary, don't think they need to be changed as I don't suspect AIN. 3. Anemia:  S/p transfusion - GI eval, no frank bleeding.  Further transfusion per primary.  Follows with hematology for leukocytosis, perhaps an ESA could be considered if Hb trending down but it's stable in the 9s at the moment. 4. Metabolic acidosis: secondary to AKI, ^ po bicarb.   5. Hyponatremia: ?SIADH.  Nadir 126 and improved to 138 on last check 6. Cirrhosis: new dx based on imaging here.  On lactulose and rifaxin. Outpt f/u planned. 7. DNR    Kelly Splinter, MD 04/14/2020, 12:36 PM

## 2020-04-14 NOTE — Care Management Important Message (Signed)
Important Message  Patient Details IM Letter given to the Patient. Name: Sarah Colon MRN: [id number] Date of Birth: [birth year]   Medicare Important Message Given:  Yes     Kerin Salen 04/14/2020, 11:48 AM

## 2020-04-14 NOTE — Consult Note (Signed)
Consultation Note Date: 04/14/2020   Patient Name: Sarah Colon  DOB: [birth year]  MRN: [id number]  Age / Sex: 74 y.o., female  PCP: Patient, No Pcp Per Referring Physician: Eugenie Filler, MD  Reason for Consultation: Establishing goals of care  HPI/Patient Profile: 74 y.o. female  with past medical history of diastolic CHF, CKD stage IV, DM type 2, paroxysmal atrial fibrillation on Xarelto, HTN, anemia admitted on 03/10/2020 with shortness of breath, chest pain, fatigue, poor appetite. Found to have Hgb of 3 on admit. Fecal occult test in ED positive. GI following. Underwent EGD and colonoscopy without evidence of bleeding. Multiple polyps removed. After further workup, patient found to have UTI with Providencia and Enterococcus Faecalis. Receiving IV antibiotic. Hospitalization complicated by worsening kidney function. Nephrology following. Only potential insult was IV contrast on 11/5. Patient is a poor dialysis candidate with baseline co-morbidities and functional status. Palliative medicine consultation for goals of care.   Clinical Assessment and Goals of Care:  I have reviewed medical records, discussed with care team, and met with patient at bedside to discuss goals of care. She is awake, alert, oriented and able to participate in discussion.   I introduced Palliative Medicine as specialized medical care for people living with serious illness. It focuses on providing relief from the symptoms and stress of a serious illness. The goal is to improve quality of life for both the patient and the family.  Prior to admission, patient at [facility] for approximately 3 months attempting rehab. Prior to this, she was home. Emotional support and therapeutic listening as patient speaks of frustrations with the nursing facility. She is hopeful to discharge to another facility than [facility]. TOC team  following.   Attempted to discuss course of hospitalization including diagnoses, interventions, plan of care. Patient does seem to understand that the reason she is still hospitalized is because of her kidneys. We discussed her poor candidacy for dialysis if it came to this. Patient is hopeful her kidney functions will stabilize. She changes the subject back to frustrations with nursing facility. She does give me permission to call her daughter, Britt Boozer.   Shortly after, spoke with [name] via telephone. Introduced role of palliative medicine.   Patient does not have a documented living will. She is not married and [name] is her only child, so understands she is default POA.   Updated daughter on her mother's condition including diagnoses, interventions, plan of care, including nephrology and GI recommendations. [name] understands her mother is not a candidate for dialysis and tells me "she wouldn't want that" even if it came to that. The patient has previously told her daughter she would NOT want dialysis and "what's gonna happen, is [place] happen." Spiritual individual of Christian faith. Daughter understands and respects her mother's decision. [name] confirms her mother's decision for DNR code status. She has a durable DNR.   Explained MOST form that her and her mother could review and consider completing with a healthcare provider. Encouraged ongoing discussions, especially while her mother is cognizant  to make decisions.   Discussed hoping for the best and that her mother's kidneys will stabilize/plateau but also 'preparing for the worst' if her kidneys should worsen. Explained that I did not want [name] to be caught off guard by her mother's kidney status if her condition should worsen in the following weeks to months. [name] understands and appreciative of information.   Discussed outpatient palliative referral at SNF. Daughter agreeable. Answered questions.    SUMMARY OF RECOMMENDATIONS     Daughter confirms her mother's decision for DNR code status.  Daughter understands her mother is not a candidate for hemodialysis and shares that her mother has previously expressed her wishes against dialysis if it came to this.   Continue current plan of care and medical management. Watchful waiting.   Encouraged AD packet and MOST form completion. Encouraged ongoing discussions regarding her wishes.   Outpatient palliative referral at SNF. TOC team following.   Code Status/Advance Care Planning:  DNR  Symptom Management:   Per attending  Palliative Prophylaxis:   Aspiration, Delirium Protocol, Frequent Pain Assessment, Oral Care and Turn Reposition  Psycho-social/Spiritual:   Desire for further Chaplaincy support: yes  Additional Recommendations: Caregiving  Support/Resources, Compassionate Wean Education and Education on Hospice  Prognosis:   Unable to determine  Discharge Planning: Back to SNF. Outpatient palliative referral.      Primary Diagnoses: Present on Admission: . Type 2 diabetes mellitus with diabetic neuropathic arthropathy ([place]) . Chronic diastolic heart failure ([place]) . Hypertension associated with diabetes ([place]) . Hypothyroidism . Paroxysmal atrial fibrillation ([facility]) . Symptomatic anemia . Abdominal pain . Diarrhea   I have reviewed the medical record, interviewed the patient and family, and examined the patient. The following aspects are pertinent.  Past Medical History:  Diagnosis Date  . Abnormality of gait 10/11/2014  . Anemia   . Arthritis    "all over"  . CHF (congestive heart failure) ([place])   . Chronic kidney disease   . Chronic lower back pain   . Chronic pain of left knee   . Chronic pain of right knee   . DM type 2 with diabetic peripheral neuropathy ([place]) 10/24/2014  . Glaucoma   . Gout dx 04/09/2013   knee tapped by ortho with monosodium urate crystals  . Hypertension   . Rhabdomyolysis   . Type II diabetes mellitus  ([place])   . Weakness of both legs 10/11/2014   Social History   Socioeconomic History  . Marital status: Widowed    Spouse name: Not on file  . Number of children: 1  . Years of education: 23  . Highest education level: Not on file  Occupational History  . Occupation: retired  Tobacco Use  . Smoking status: Former Smoker    Packs/day: 0.12    Years: 20.00    Pack years: 2.40    Types: Cigarettes  . Smokeless tobacco: Never Used  . Tobacco comment: "smoked cigarettes til I was ~ 33"  Vaping Use  . Vaping Use: Never used  Substance and Sexual Activity  . Alcohol use: No    Comment: "quit drinking in ~ 1999"  . Drug use: No  . Sexual activity: Not on file  Other Topics Concern  . Not on file  Social History Narrative   Patient is right handed.   Patient drinks 1 cup of caffeine daily.   Social Determinants of Health   Financial Resource Strain:   . Difficulty of Paying Living Expenses: Not on file  Food  Insecurity:   . Worried About Charity fundraiser in the Last Year: Not on file  . Ran Out of Food in the Last Year: Not on file  Transportation Needs:   . Lack of Transportation (Medical): Not on file  . Lack of Transportation (Non-Medical): Not on file  Physical Activity:   . Days of Exercise per Week: Not on file  . Minutes of Exercise per Session: Not on file  Stress:   . Feeling of Stress : Not on file  Social Connections:   . Frequency of Communication with Friends and Family: Not on file  . Frequency of Social Gatherings with Friends and Family: Not on file  . Attends Religious Services: Not on file  . Active Member of Clubs or Organizations: Not on file  . Attends Archivist Meetings: Not on file  . Marital Status: Not on file   Family History  Problem Relation Age of Onset  . Kidney failure Mother   . Lung disease Father   . Uterine cancer Sister    Scheduled Meds: . apixaban  2.5 mg Oral BID  . diltiazem  120 mg Oral Daily  . gabapentin   300 mg Oral QHS  . guaiFENesin  1,200 mg Oral BID  . insulin aspart  0-9 Units Subcutaneous TID WC  . lactulose  10 g Oral BID  . levothyroxine  88 mcg Oral Q0600  . metoprolol tartrate  25 mg Oral BID  . pantoprazole  40 mg Oral Daily  . rifaximin  550 mg Oral BID  . sodium bicarbonate  1,300 mg Oral TID  . sodium chloride flush  3 mL Intravenous Q12H   Continuous Infusions: . sodium chloride Stopped (04/01/20 0906)   PRN Meds:.sodium chloride, acetaminophen, levalbuterol, ondansetron (ZOFRAN) IV, polyethylene glycol, sodium chloride flush, traMADol Medications Prior to Admission:  Prior to Admission medications   Medication Sig Start Date End Date Taking? Authorizing Provider  acetaminophen (TYLENOL) 325 MG tablet Take 650 mg by mouth every 6 (six) hours as needed for mild pain or moderate pain.    Yes [provider]  albuterol (PROVENTIL) (2.5 MG/3ML) 0.083% nebulizer solution Take 2.5 mg by nebulization every 4 (four) hours as needed for wheezing or shortness of breath.   Yes [provider]  allopurinol (ZYLOPRIM) 100 MG tablet Take 1 tablet (100 mg total) by mouth daily. 09/10/14  Yes Short, Noah Delaine, MD  colchicine 0.6 MG tablet Take 0.6 mg by mouth daily as needed (gout).    Yes [provider]  DILT-XR 180 MG 24 hr capsule Take 180 mg by mouth daily.  12/07/19  Yes [provider]  gabapentin (NEURONTIN) 600 MG tablet Take 1 tablet (600 mg total) by mouth 2 (two) times daily. 12/08/19  Yes Walisiewicz, Kaitlyn E, PA-C  glucagon (GLUCAGEN) 1 MG SOLR injection Inject 1 mg into the muscle once as needed for low blood sugar.    Yes [provider]  insulin detemir (LEVEMIR FLEXTOUCH) 100 UNIT/ML FlexPen Inject 20 Units into the skin at bedtime.   Yes [provider]  JANUVIA 25 MG tablet Take 25 mg by mouth daily. 12/07/19  Yes [provider]  levothyroxine (SYNTHROID) 88 MCG tablet Take 88 mcg by mouth daily before  breakfast.   Yes [provider]  metoprolol tartrate (LOPRESSOR) 50 MG tablet Take 50 mg by mouth 2 (two) times daily.   Yes [provider]  Multiple Vitamin (MULTIVITAMIN WITH MINERALS) TABS tablet Take 1  tablet by mouth daily.   Yes [provider]  NON FORMULARY Take 120 mLs by mouth in the morning, at noon, and at bedtime. Medpass   Yes [provider]  pantoprazole (PROTONIX) 40 MG tablet Take 40 mg by mouth daily.   Yes [provider]  pioglitazone (ACTOS) 15 MG tablet Take 15 mg by mouth daily. 01/16/18  Yes [provider]  Pollen Extracts (PROSTAT PO) Take 30 mLs by mouth in the morning and at bedtime.   Yes [provider]  rivaroxaban (XARELTO) 15 MG TABS tablet Take 1 tablet (15 mg total) by mouth daily. 12/13/19  Yes Virl Axe, MD  senna (GERI-KOT) 8.6 MG tablet Take 1 tablet by mouth daily as needed for constipation.   Yes [provider]  tamsulosin (FLOMAX) 0.4 MG CAPS capsule Take 1 capsule (0.4 mg total) by mouth daily after supper. 02/09/18  Yes Agyei, Caprice Kluver, MD  traMADol (ULTRAM) 50 MG tablet Take 1 tablet (50 mg total) by mouth every 6 (six) hours as needed for moderate pain or severe pain. 12/08/19  Yes Walisiewicz, Kaitlyn E, PA-C  insulin glargine (LANTUS) 100 UNIT/ML injection Inject 0.2 mLs (20 Units total) into the skin at bedtime. Patient not taking: Reported on 12/25/2019 01/13/15   Gerlene Fee, NP  metoprolol tartrate (LOPRESSOR) 25 MG tablet Take 1 tablet (25 mg total) by mouth 2 (two) times daily. Patient not taking: Reported on 03/24/2020 12/23/19   Caccavale, Sophia, PA-C  senna (SENOKOT) 8.6 MG tablet Take 2 tablets by mouth daily. Patient not taking: Reported on 12/25/2019    [provider]   Allergies  Allergen Reactions  . Codeine Other (See Comments)    Swelling   Review of Systems  Constitutional: Positive for activity change.  Neurological: Positive for weakness.     Physical Exam Vitals and nursing note reviewed.  Constitutional:      General: She is awake.     Appearance: She is ill-appearing.  HENT:     Head: Normocephalic and atraumatic.  Pulmonary:     Effort: No tachypnea, accessory muscle usage or respiratory distress.  Skin:    General: Skin is warm.  Neurological:     Mental Status: She is alert and oriented to person, place, and time.  Psychiatric:        Mood and Affect: Mood normal.        Speech: Speech normal.        Behavior: Behavior normal.        Cognition and Memory: Cognition normal.     Vital Signs: BP (!) 145/60 (BP Location: Left Arm)   Pulse 98   Temp (!) 97.4 F (36.3 C) (Oral)   Resp 20   Ht 5' 1.5" (1.562 m)   Wt 68.6 kg   SpO2 100%   BMI 28.11 kg/m  Pain Scale: 0-10 POSS *See Group Information*: 1-Acceptable,Awake and alert Pain Score: 2    SpO2: SpO2: 100 % O2 Device:SpO2: 100 % O2 Flow Rate: .O2 Flow Rate (L/min): 2 L/min  IO: Intake/output summary:   Intake/Output Summary (Last 24 hours) at 04/14/2020 1439 Last data filed at 04/14/2020 1200 Gross per 24 hour  Intake 883 ml  Output 650 ml  Net 233 ml    LBM: Last BM Date: 04/14/20 Baseline Weight: Weight: 60.3 kg Most recent weight: Weight: 68.6 kg     Palliative Assessment/Data: PPS 40%    Time Total: 4mn Greater than 50%  of this time  was spent counseling and coordinating care related to the above assessment and plan.  Signed by:  Ihor Dow, DNP, FNP-C Palliative Medicine Team  Phone: [phone] Fax: [phone]   Please contact Palliative Medicine Team phone at [phone] for questions and concerns.  For individual provider: See Shea Evans

## 2020-04-15 DIAGNOSIS — D649 Anemia, unspecified: Secondary | ICD-10-CM | POA: Diagnosis not present

## 2020-04-15 DIAGNOSIS — N179 Acute kidney failure, unspecified: Secondary | ICD-10-CM | POA: Diagnosis not present

## 2020-04-15 DIAGNOSIS — E039 Hypothyroidism, unspecified: Secondary | ICD-10-CM | POA: Diagnosis not present

## 2020-04-15 DIAGNOSIS — I5033 Acute on chronic diastolic (congestive) heart failure: Secondary | ICD-10-CM | POA: Diagnosis not present

## 2020-04-15 LAB — RENAL FUNCTION PANEL
Albumin: 2.6 g/dL — ABNORMAL LOW (ref 3.5–5.0)
Anion gap: 14 (ref 5–15)
BUN: 39 mg/dL — ABNORMAL HIGH (ref 8–23)
CO2: 15 mmol/L — ABNORMAL LOW (ref 22–32)
Calcium: 8.2 mg/dL — ABNORMAL LOW (ref 8.9–10.3)
Chloride: 106 mmol/L (ref 98–111)
Creatinine, Ser: 4.55 mg/dL — ABNORMAL HIGH (ref 0.44–1.00)
GFR, Estimated: 9 mL/min — ABNORMAL LOW (ref >60)
Glucose, Bld: 121 mg/dL — ABNORMAL HIGH (ref 70–99)
Phosphorus: 6.2 mg/dL — ABNORMAL HIGH (ref 2.5–4.6)
Potassium: 3.4 mmol/L — ABNORMAL LOW (ref 3.5–5.1)
Sodium: 135 mmol/L (ref 135–145)

## 2020-04-15 LAB — GLUCOSE, CAPILLARY
Glucose-Capillary: 115 mg/dL — ABNORMAL HIGH (ref 70–99)
Glucose-Capillary: 135 mg/dL — ABNORMAL HIGH (ref 70–99)
Glucose-Capillary: 129 mg/dL — ABNORMAL HIGH (ref 70–99)
Glucose-Capillary: 120 mg/dL — ABNORMAL HIGH (ref 70–99)

## 2020-04-15 LAB — HEMOGLOBIN AND HEMATOCRIT, BLOOD
HCT: 27.5 % — ABNORMAL LOW (ref 36.0–46.0)
Hemoglobin: 8.9 g/dL — ABNORMAL LOW (ref 12.0–15.0)

## 2020-04-15 MED ORDER — POTASSIUM CHLORIDE CRYS ER 20 MEQ PO TBCR
20.0000 meq | EXTENDED_RELEASE_TABLET | Freq: Once | ORAL | Status: AC
  Administered 2020-04-15: 20 meq via ORAL
  Filled 2020-04-15: qty 1

## 2020-04-15 NOTE — Progress Notes (Addendum)
PROGRESS NOTE    Sarah Colon  MAU:[id number] DOB: 1945-10-29 DOA: 03/08/2020 PCP: Patient, No Pcp Per    Chief Complaint  Patient presents with  . Cough  . Shortness of Breath    Brief Narrative:  The patient is a 74 year old African-American female with a past medical history significant for but not limited to diabetes mellitus type 2, diastolic CHF, hypertension, paroxysmal atrial fibrillation, chronic kidney disease stage IV with a baseline creatinine of 1.5-1.8 as well as other comorbidities who presented to the ED with shortness of breath, chest pain, fatigue over the last week.  She also reported a poor appetite.  She is found to have a hemoglobin of 3 on admission.  There are no reports of blood in her stool or dark or tarry stools.  Fecal occult test in the ED was positive.  GI was consulted and she was admitted to the hospitalist service.  She normally lives in SNF is nonambulatory and in the wheelchair.  She underwent EGD and colonoscopy without evidence of any bleeding.  She had multiple polyp removal from her colonoscopy today.  Her prep was poor.  GI recommending resuming Xarelto 04/03/20 and she received another dose of diuresis yesterday given her volume overload.  Her Xarelto is now resumed today but this evening on 04/04/2019 when she spiked a temperature and she does have a leukocytosis which is chronic but we will panculture to rule out infection.  She is complaining of some abdominal pain so we will get a CT of the abdomen pelvis.  Chest x-ray shows possible pneumonia so she was started on Unasyn and SLP was consulted given the location being the Right Lung Base.  **After further workup it appears the patient has a UTI with Providencia and Entercoccus Faecalis. We will escalate her antibiotics to IV meropenem and hold off of Vancomycin for now. Repeat CT scan showed persistent mild perirectal and presacral fat stranding of unclear etiology and presacral rectal findings  associate with cortical erosions, sclerosis and thinning of the S1 vertebral body. As well as the urinary bladder that was thickened circumferentially. Hemoglobin has dropped a little bit GI feels that the CT findings are in the setting of fecal impaction and due to stool inflammation from the fecal impaction. They feel that her anemia worsens and she will need a flex sig with enemas for prep prior to discharge to reevaluate the rectum after resolution of the fecal impaction. We will escalate antibiotics given her UTI.  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  Her renal function continues to worsen and we are doing further work-up will obtain a bladder scan, renal ultrasound and started the patient back on gentle IV fluids with normal saline at 25 mils per hour.  We are to place a Foley catheter for strict I's and O's however patient refused this.  We will continue monitor and will notify nephrology about her worsening renal function.  Nephrology feels that Contrast from last CT scan caused her AKI and GI has signed off and feel as if her Abdominal Pain is multifactorial. They are going to hold off on Flex Sigmoidoscopy.     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   Currently on heparin.  FOBT which was done was positive.  Patient status post transfusion of 3 units packed red blood cells.  Hemoglobin on admission was 3.1 and currently at 9.1.  Patient with no overt bleeding.  Patient seen in consultation by GI underwent upper endoscopy 04/27/2020 without any significant findings or source of GI bleed.  Patient underwent rescheduled colonoscopy 04/12/2020 with poor prep and showed 2 to 5 mm polyps in the cecum, 3 5-9MM polyps in the transverse colon which were removed.  Biopsies consistent with tubular adenoma without high-grade dysplasia.  Due to poor prep GI recommending repeat colonoscopy in 6 months.  Patient currently on a soft diet.  Unable to resume Xarelto due to renal function as such Eliquis started at 2.5 mg twice daily.  CT abdomen and pelvis was done which showed focal rectal wall thickening, acute cellular fluid as well as circumferential thickened bladder.  Leukocytosis initially was trending back up and started to trend back down.  Patient afebrile.  GI was reconsulted and patient given a soapsuds enema due to concerns for fecal impaction..  Per GI if continued drop in hemoglobin will need a flex sigmoidoscopy.  Currently holding off on flex sigmoidoscopy.  GI has signed off.  Repeat H&H in the morning.  Follow.   2.  Left lower quadrant abdominal pain/fecal impaction CT abdomen and pelvis done without any acute findings.  Patient noted to have had a fever with a leukocytosis.  Fever likely secondary from UTI.  CT with focal rectal wall thickening of acute cellular fluid as well as circumferentially thickened bladder.  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  Status post full course IV antibiotics.  Patient was seen in consultation by GI who felt patient had a component of fecal impaction causing her abdominal pain.  Soapsuds enema was ordered early on during the hospitalization.  Patient with complaints of diffuse abdominal pain and initially  noted not to have had a bowel movement in several days.  Patient status post enema and having bowel movements with clinical improvement with abdominal pain.  Continue lactulose.  Patient refusing MiraLAX and as such has been changed to as needed.  Supportive care.   3.  Liver cirrhosis New diagnosis.  MRI of abdomen was degraded exam however no concerning lesions noted repeat MRI recommended which can be done in the outpatient setting.  Patient noted to be refusing lactulose today.  Continue Xifaxan and lactulose.  Outpatient follow-up with GI.   4.  Acute metabolic encephalopathy Likely secondary to symptomatic profound anemia.  Ammonia levels within normal limits.  Clinical improvement and likely at baseline.  5.  Acute renal failure on chronic kidney disease stage IV Baseline creatinine approximately 2.  Patient seen in consultation by nephrology who do not feel patient has any evidence of AIN and do not recommend any change in antibiotics.  Patient could likely have developed acute insult on chronic kidney disease stage IV from contrast nephropathy from CT done 04/04/2020.  Creatinine starting to trend back down currently at 4.55 from  4.82 from 4.86 from 4.50 from 4.36(04/09/2020) from 4.28 from 4.07 from 3.76 from 3.62 from 2.95 from 1.87 from 1.41 from 2.30 on admission.  Output starting to pick up and had 600 cc over the past 24 hours. Nephrology does not feel patient is a good long-term dialysis candidate but  discussed with patient for temporary dialysis if needed.  Patient with lower extremity edema which is unchanged.  Fluids have been saline locked.  Midline placed however unable to draw labs and as such he was discontinued as patient now with right upper extremity swelling.  Per nephrology.  6.  Providencia stuartii and VRE UTI Status post IV antibiotics.  Supportive care.  Follow.     7.  Metabolic acidosis Likely secondary to acute kidney injury.  Was improving however trending back  down.  On bicarb tablets.  Per nephrology.    8.  Hyponatremia ??  SIADH.  Patient on 1200 cc fluid restriction per nephrology.  Hyponatremia improving.  Sodium at 135 today.  Follow.   9.  Hyperkalemia Resolved with Lokelma.  Now with hypokalemia.  10.  Acute on chronic diastolic CHF Patient noted during the hospitalization early on to be volume overloaded on examination with lower extremity edema.  BNP elevated at 330 however patient with chronic kidney disease.  Pleural effusions noted on chest x-ray.  Patient status post intermittent diuresis with clinical improvement.  Patient with no respiratory symptoms.  Patient with lower extremity edema.  IV fluids have been saline lock.  Follow.   11.  Hypothyroidism Continue Synthroid.   12.  Well-controlled diabetes mellitus type 2 Hemoglobin A1c 5.6.  CBG 115 this morning.  Sliding scale insulin.  13.  Paroxysmal atrial fibrillation Continue Cardizem and Lopressor for rate control.  Hemoglobin stable.  Xarelto not resumed due to renal function.  Eliquis 2.5 mg twice daily for anticoagulation.  Outpatient follow-up.  14.  Hypokalemia Potassium at 3.4.  K. Dur 20 mEq p.o. x1.   15.  Leukocytosis Likely reactive.  Patient afebrile.  Status post full course of antibiotic treatment for UTI.  Patient with no respiratory symptoms.  Leukocytosis trending down.  No need for any further antibiotics at this time.  16.  Right upper extremity edema Likely secondary to infiltration of midline.  Midline discontinued.  Patient on anticoagulation and as such DVT likely low on the differential.  Keep right upper extremity elevated.  Warm compresses.  If no improvement will need right upper extremity ultrasound for further evaluation.  17. Sacral ulcer, POA Pressure Injury 04/22/2020 Sacrum Medial Stage III -  Full thickness tissue loss. Subcutaneous fat may be visible but bone, tendon or muscle are NOT exposed. (Active)  04/13/2020 1149  Location: Sacrum    Location Orientation: Medial  Staging: Stage III -  Full thickness tissue loss. Subcutaneous fat may be visible but bone, tendon or muscle are NOT exposed.  Wound Description (Comments):   Present on Admission: Yes         DVT prophylaxis: Eliquis Code Status: DNR Family Communication: Updated patient.  No family at bedside. Disposition:   Status is: Inpatient    Dispo: The patient is from: SNF              Anticipated d/c is to: SNF              Anticipated d/c date is: To be determined.  When cleared by nephrology.              Patient  currently with acute on chronic kidney disease stage IV, being followed by nephrology, status post IV antibiotics for UTI.  Right upper extremity swelling.  Anemia status post transfusion.  Not stable for discharge.        Consultants:   Nephrology: Dr. Johnney Ou 04/07/2020  Gastroenterology: Dr. Alessandra Bevels 03/30/2020  Palliative care: Ihor Dow, NP 04/14/2020  Procedures:   Transfuse 3 units packed red blood cells 03/08/2020--04/18/2020  Colonoscopy 04/28/2020--per Dr. Alessandra Bevels  Upper endoscopy 04/21/2020--by Dr. Alessandra Bevels  CT abdomen and pelvis 04/04/2020, 03/26/2020  CT pelvis 03/12/2020  Chest x-ray 03/18/2020, 03/30/2020, 04/03/2020  MRI liver 04/01/2020  Renal ultrasound 04/06/2020  2D echo 03/30/2020  Antimicrobials:  Anti-infectives (From admission, onward)   Start     Dose/Rate Route Frequency Ordered Stop   04/06/20 1430  piperacillin-tazobactam (ZOSYN) IVPB 2.25 g        2.25 g 100 mL/hr over 30 Minutes Intravenous Every 6 hours 04/06/20 1320 04/11/20 2359   04/05/20 1930  meropenem (MERREM) 1 g in sodium chloride 0.9 % 100 mL IVPB  Status:  Discontinued        1 g 200 mL/hr over 30 Minutes Intravenous Every 12 hours 04/05/20 1832 04/06/20 1320   04/04/20 1000  Ampicillin-Sulbactam (UNASYN) 3 g in sodium chloride 0.9 % 100 mL IVPB  Status:  Discontinued        3 g 200 mL/hr over 30 Minutes Intravenous Every  12 hours 04/04/20 0851 04/05/20 1829   03/30/20 1400  rifaximin (XIFAXAN) tablet 550 mg        550 mg Oral 2 times daily 03/30/20 1136         Subjective: Patient sleeping.  Arousable but drowsy.  Denies any chest pain no shortness of breath.  Denies any significant abdominal pain.  Complains of right upper extremity swelling.   Objective: Vitals:   04/14/20 1200 04/14/20 1950 04/15/20 0500 04/15/20 0531  BP: (!) 145/60 (!) 128/56  119/69  Pulse: 98 70  60  Resp: 20 18  18   Temp: (!) 97.4 F (36.3 C) 98.4 F (36.9 C)  98.1 F (36.7 C)  TempSrc: Oral Oral  Oral  SpO2: 100% 99%  98%  Weight:   68.1 kg   Height:        Intake/Output Summary (Last 24 hours) at 04/15/2020 1308 Last data filed at 04/15/2020 1100 Gross per 24 hour  Intake 243 ml  Output 200 ml  Net 43 ml   Filed Weights   04/13/20 0536 04/14/20 0347 04/15/20 0500  Weight: 67.2 kg 68.6 kg 68.1 kg    Examination:  General exam: NAD Respiratory system: CTAB anterior lung fields.  No wheezes, no crackles, no rhonchi.  Cardiovascular system: Regular rate rhythm no murmurs rubs or gallops.  No JVD.  1-2+ bilateral lower extremity edema. Gastrointestinal system: Abdomen is soft, less distended, positive bowel sounds, less tender to palpation.  No rebound.  No guarding. Central nervous system: Alert and oriented. No focal neurological deficits. Extremities: Right upper extremity swelling where prior midline was.  Skin: No rashes, lesions or ulcers Psychiatry: Judgement and insight appear normal. Mood & affect appropriate.     Data Reviewed: I have personally reviewed following labs and imaging studies  CBC: Recent Labs  Lab 04/09/20 0621 04/09/20 0621 04/11/20 0427 04/12/20 1130 04/13/20 1031 04/14/20 0931 04/15/20 0744  WBC 12.1*  --  14.7* 15.8* 18.3* 16.0*  --   NEUTROABS 7.8*  --   --   --   --  9.9*  --   HGB 9.1*   < > 9.1* 9.1* 9.3* 8.4* 8.9*  HCT 28.5*   < > 28.6* 28.4* 28.9* 25.2* 27.5*   MCV 79.2*  --  77.7* 76.8* 77.1* 76.6*  --   PLT 353  --  472* 602* 689* 652*  --    < > = values in this interval not displayed.    Basic Metabolic Panel: Recent Labs  Lab 04/09/20 0621 04/11/20 0427 04/12/20 1130 04/13/20 1031 04/15/20 0744  NA 133* 138 138 133* 135  K 3.9 3.2* 3.2* 3.1* 3.4*  CL 104 110 108 105 106  CO2 17* 15* 17* 19* 15*  GLUCOSE 100* 104* 108* 123* 121*  BUN 50* 46* 46* 46* 39*  CREATININE 4.36* 4.50* 4.86* 4.82* 4.55*  CALCIUM 7.8* 8.1* 8.0* 7.8* 8.2*  PHOS 7.0* 6.2* 6.4* 5.9* 6.2*    GFR: Estimated Creatinine Clearance: 9.4 mL/min (A) (by C-G formula based on SCr of 4.55 mg/dL (H)).  Liver Function Tests: Recent Labs  Lab 04/09/20 0621 04/11/20 0427 04/12/20 1130 04/13/20 1031 04/15/20 0744  ALBUMIN 2.1* 2.2* 2.3* 2.3* 2.6*    CBG: Recent Labs  Lab 04/14/20 0844 04/14/20 1147 04/14/20 1637 04/14/20 2116 04/15/20 0833  GLUCAP 104* 145* 126* 142* 115*     No results found for this or any previous visit (from the past 240 hour(s)).       Radiology Studies: No results found.      Scheduled Meds: . apixaban  2.5 mg Oral BID  . diltiazem  120 mg Oral Daily  . gabapentin  300 mg Oral QHS  . guaiFENesin  1,200 mg Oral BID  . insulin aspart  0-9 Units Subcutaneous TID WC  . lactulose  10 g Oral BID  . levothyroxine  88 mcg Oral Q0600  . metoprolol tartrate  25 mg Oral BID  . pantoprazole  40 mg Oral Daily  . rifaximin  550 mg Oral BID  . sodium bicarbonate  1,300 mg Oral TID  . sodium chloride flush  3 mL Intravenous Q12H   Continuous Infusions: . sodium chloride Stopped (04/01/20 0906)     LOS: 17 days    Time spent: 35 mins    Irine Seal, MD [facility]   To contact the attending provider between 7A-7P or the covering provider during after hours 7P-7A, please log into the web site [email or web address] and access using universal [place] password for that web site. If you do not have the password,  please call the hospital operator.  04/15/2020, 1:08 PM

## 2020-04-15 NOTE — Progress Notes (Signed)
[facility] Progress Note   Subjective:   Pt remains a bit lethargic, creat down today at 4.5 (4.8 yest), UOP recorded 200 cc.    Objective Vitals:   04/14/20 1200 04/14/20 1950 04/15/20 0500 04/15/20 0531  BP: (!) 145/60 (!) 128/56  119/69  Pulse: 98 70  60  Resp: 20 18  18   Temp: (!) 97.4 F (36.3 C) 98.4 F (36.9 C)  98.1 F (36.7 C)  TempSrc: Oral Oral  Oral  SpO2: 100% 99%  98%  Weight:   68.1 kg   Height:       Physical Exam  General: lying in bed comfortable, chronically ill and weak but nontoxic Lungs: normal WOB GU: purewick Ext: 2+  bilat LE edema, RUE 2+ edema Neuro: lethargic, awakens and responds approp, bed bound  Additional Objective Labs: Basic Metabolic Panel: Recent Labs  Lab 04/12/20 1130 04/13/20 1031 04/15/20 0744  NA 138 133* 135  K 3.2* 3.1* 3.4*  CL 108 105 106  CO2 17* 19* 15*  GLUCOSE 108* 123* 121*  BUN 46* 46* 39*  CREATININE 4.86* 4.82* 4.55*  CALCIUM 8.0* 7.8* 8.2*  PHOS 6.4* 5.9* 6.2*   Liver Function Tests: Recent Labs  Lab 04/12/20 1130 04/13/20 1031 04/15/20 0744  ALBUMIN 2.3* 2.3* 2.6*   No results for input(s): LIPASE, AMYLASE in the last 168 hours. CBC: Recent Labs  Lab 04/09/20 0621 04/09/20 0621 04/11/20 0427 04/11/20 0427 04/12/20 1130 04/12/20 1130 04/13/20 1031 04/14/20 0931 04/15/20 0744  WBC 12.1*   < > 14.7*   < > 15.8*  --  18.3* 16.0*  --   NEUTROABS 7.8*  --   --   --   --   --   --  9.9*  --   HGB 9.1*   < > 9.1*   < > 9.1*   < > 9.3* 8.4* 8.9*  HCT 28.5*   < > 28.6*   < > 28.4*   < > 28.9* 25.2* 27.5*  MCV 79.2*  --  77.7*  --  76.8*  --  77.1* 76.6*  --   PLT 353   < > 472*   < > 602*  --  689* 652*  --    < > = values in this interval not displayed.   Medications: . sodium chloride Stopped (04/01/20 0906)   . apixaban  2.5 mg Oral BID  . diltiazem  120 mg Oral Daily  . gabapentin  300 mg Oral QHS  . guaiFENesin  1,200 mg Oral BID  . insulin aspart  0-9 Units  Subcutaneous TID WC  . lactulose  10 g Oral BID  . levothyroxine  88 mcg Oral Q0600  . metoprolol tartrate  25 mg Oral BID  . pantoprazole  40 mg Oral Daily  . rifaximin  550 mg Oral BID  . sodium bicarbonate  1,300 mg Oral TID  . sodium chloride flush  3 mL Intravenous Q12H    Dialysis Orders:  Assessment/Plan 1. AKI on CKD 4:  Baseline creatinine around 2, was at baseline on presentation but developed AKI here.  No hypotension.  The only potential insult was IV contrast administration on 11/5.  She does have a UTI that is being treated but no evidence of AIN. She is vol overloaded, up 6-7kg from admit.  - Creat down today at 4.5, 1st time improving, hopefully will continue - Given her refusal of multiple interventions and poor baseline functional status pt not dialysis candidate  for dialysis  2. Multiorganism UTI:  Antibiotics per primary 3. Anemia:  S/p transfusion - GI eval, no frank bleeding.  Further transfusion per primary.  Follows with hematology for leukocytosis, perhaps an ESA could be considered if Hb trending down but it's stable in the 9s at the moment. 4. Metabolic acidosis: secondary to AKI, ^ po bicarb.   5. Hyponatremia: resolved now , Na 135 6. Cirrhosis: new dx based on imaging here.  On lactulose and rifaxin. Outpt f/u planned. 7. DNR    Kelly Splinter, MD 04/15/2020, 5:10 PM

## 2020-04-15 NOTE — Progress Notes (Signed)
PT Cancellation Note  Patient Details Name: Sarah Colon MRN: [id number] DOB: Sep 09, 1945   Cancelled Treatment:    Reason Eval/Treat Not Completed: Patient declined, no reason specified  Pt declined due to wanting to sleep.  Tried to encourage and educated.  Pt had asked for PT to "push/insist" in the past; however, today pt became angry with increased attempts from PT.  Will f/u as able.  Abran Richard, PT Acute Rehab Services Pager [phone] [facility] Rehab [phone]   [name] 04/15/2020, 1:23 PM

## 2020-04-15 NOTE — TOC Progression Note (Signed)
Transition of Care [facility]) - Progression Note    Patient Details  Name: Sarah Colon MRN: [id number] Date of Birth: 04-22-1946  [facility]) CM/SW Contact  Joaquin Courts, RN Phone Number: 04/15/2020, 2:57 PM  Clinical Narrative:    CM followed up with patient and explained no other bed offers at this time.  Patient is down to her last 6 medicare rehab days.  CM spoke with patient's daughter who confirms that she is unable to manage patient's care at home and patient will need long term care.  CM explained the rehab situation and the ending of medicare covered rehab days for the year.  No other facilities have extended bed offers.  Patient currently only has one facility option and that is to return to [name] where she will convert to a long term care patient.  The only barrier to this is that either the patient or her daughter need to sign the patient back into the facility.  Patient states she will do what her daughter wants her to and defers signing papers to her daughter.  Daughter states she is not authorized to sign anything and patient is able to sign herself back in and will need to do so.  Daughter states she has been calling patient today who is not picking up her phone, requests patient call her back so they can talk about this.  CM spoke with patient and explained what daughter stated, asked patient to call her daughter. Patient states: " I will call her later".     Expected Discharge Plan: [place] Barriers to Discharge: Continued Medical Work up  Expected Discharge Plan and Services Expected Discharge Plan: [place]   Discharge Planning Services: CM Consult Post Acute Care Choice: Resumption of Svcs/PTA Provider Living arrangements for the past 2 months: [place]                 DME Arranged: N/A         HH Arranged: NA           Social Determinants of Health (SDOH) Interventions     Readmission Risk Interventions Readmission Risk Prevention Plan 04/14/2020  Transportation Screening Complete  Medication Review Press photographer) Complete  PCP or Specialist appointment within 3-5 days of discharge Complete  HRI or Home Care Consult Complete  SW Recovery Care/Counseling Consult Complete  Palliative Care Screening Not [place] Complete  Some recent data might be hidden

## 2020-04-16 ENCOUNTER — Inpatient Hospital Stay (HOSPITAL_COMMUNITY): Payer: Medicare Other

## 2020-04-16 DIAGNOSIS — R609 Edema, unspecified: Secondary | ICD-10-CM | POA: Diagnosis not present

## 2020-04-16 DIAGNOSIS — D649 Anemia, unspecified: Secondary | ICD-10-CM | POA: Diagnosis not present

## 2020-04-16 LAB — GLUCOSE, CAPILLARY
Glucose-Capillary: 119 mg/dL — ABNORMAL HIGH (ref 70–99)
Glucose-Capillary: 124 mg/dL — ABNORMAL HIGH (ref 70–99)
Glucose-Capillary: 144 mg/dL — ABNORMAL HIGH (ref 70–99)
Glucose-Capillary: 136 mg/dL — ABNORMAL HIGH (ref 70–99)

## 2020-04-16 LAB — HEMOGLOBIN AND HEMATOCRIT, BLOOD
HCT: 25.3 % — ABNORMAL LOW (ref 36.0–46.0)
Hemoglobin: 8.2 g/dL — ABNORMAL LOW (ref 12.0–15.0)

## 2020-04-16 LAB — RENAL FUNCTION PANEL
Albumin: 2.6 g/dL — ABNORMAL LOW (ref 3.5–5.0)
Anion gap: 12 (ref 5–15)
BUN: 40 mg/dL — ABNORMAL HIGH (ref 8–23)
CO2: 17 mmol/L — ABNORMAL LOW (ref 22–32)
Calcium: 8.2 mg/dL — ABNORMAL LOW (ref 8.9–10.3)
Chloride: 107 mmol/L (ref 98–111)
Creatinine, Ser: 4.17 mg/dL — ABNORMAL HIGH (ref 0.44–1.00)
GFR, Estimated: 11 mL/min — ABNORMAL LOW (ref >60)
Glucose, Bld: 108 mg/dL — ABNORMAL HIGH (ref 70–99)
Phosphorus: 6.1 mg/dL — ABNORMAL HIGH (ref 2.5–4.6)
Potassium: 3.5 mmol/L (ref 3.5–5.1)
Sodium: 136 mmol/L (ref 135–145)

## 2020-04-16 MED ORDER — FUROSEMIDE 40 MG PO TABS
20.0000 mg | ORAL_TABLET | Freq: Two times a day (BID) | ORAL | Status: DC
  Administered 2020-04-16 – 2020-04-25 (×19): 20 mg via ORAL
  Filled 2020-04-16 (×20): qty 1

## 2020-04-16 NOTE — Progress Notes (Signed)
Manufacturing engineer [facility])  Hospital Liaison RN note         Notified by [facility] manager of patient/family request for [facility] Palliative services at home after discharge.         Writer spoke with patient's daughter Britt Boozer to confirm interest and explain services.               [facility] Palliative team will follow up with patient after discharge.         Please call with any hospice or palliative related questions.         Thank you for the opportunity to participate in this patient's care.     Chrislyn Edison Pace, BSN, RN [place] (listed on [place] under Hospice[facility])    [phone]

## 2020-04-16 NOTE — TOC Progression Note (Signed)
Transition of Care [facility]) - Progression Note    Patient Details  Name: Sarah Colon MRN: [id number] Date of Birth: 11/26/1945  Transition of Care [facility]) CM/SW Contact  Purcell Mouton, RN Phone Number: 04/16/2020, 11:21 AM  Clinical Narrative:    Spoke with pt and her daughter [name] concerning paperwork for [name]. Daughter was emailed a copy of [name] paperwork. Pt and daughter agreed for pt to sign paperwork for [name]. Faxed and called [name] concerning paperwork.    Expected Discharge Plan: [place] Barriers to Discharge: Continued Medical Work up  Expected Discharge Plan and Services Expected Discharge Plan: [place]   Discharge Planning Services: CM Consult Post Acute Care Choice: Resumption of Svcs/PTA Provider Living arrangements for the past 2 months: [place]                 DME Arranged: N/A         HH Arranged: NA           Social Determinants of Health (SDOH) Interventions    Readmission Risk Interventions Readmission Risk Prevention Plan 04/14/2020  Transportation Screening Complete  Medication Review Press photographer) Complete  PCP or Specialist appointment within 3-5 days of discharge Complete  HRI or Home Care Consult Complete  SW Recovery Care/Counseling Consult Complete  Palliative Care Screening Not [place] Complete  Some recent data might be hidden

## 2020-04-16 NOTE — Progress Notes (Signed)
PROGRESS NOTE    Sarah Colon  GUY:[id number] DOB: 11/22/1945 DOA: 03/16/2020 PCP: Patient, No Pcp Per   Chief Complaint  Patient presents with  . Cough  . Shortness of Breath    Brief Narrative:  The patient is Sarah Colon 74 year old African-American female with Ashlinn Hemrick past medical history significant for but not limited to diabetes mellitus type 2, diastolic CHF, hypertension, paroxysmal atrial fibrillation, chronic kidney disease stage IV with Sarah Colon baseline creatinine of 1.5-1.8 as well as other comorbidities who presented to the ED with shortness of breath, chest pain, fatigue over the last week. She also reported Sarah Colon poor appetite. She is found to have Sarah Colon hemoglobin of 3 on admission. There are no reports of blood in her stool or dark or tarry stools. Fecal occult test in the ED was positive. GI was consulted and she was admitted to the hospitalist service. She normally lives in SNF is nonambulatory and in the wheelchair. She underwent EGD and colonoscopy without evidence of any bleeding. She had multiple polyp removal from her colonoscopy today. Her prep was poor. GI recommending resuming Xarelto 04/03/20 and she received another dose of diuresis yesterday given her volume overload. Her Xarelto is now resumed today but this evening on 04/04/2019 when she spiked Jes Costales temperature and she does have Sarah Colon leukocytosis which is chronic but we will panculture to rule out infection.  She is complaining of some abdominal pain so we will get [name] CT of the abdomen pelvis. Chest x-ray shows possible pneumonia so she was started on Unasyn and SLP was consulted given the location being the Right Lung Base.  **After further workup it appears the patient has Shannyn Jankowiak UTI with Providencia and Entercoccus Faecalis. We will escalate her antibiotics to IV meropenem and hold off of Vancomycin for now. Repeat CT scan showed persistent mild perirectal and presacral fat stranding of unclear etiology and presacral rectal findings  associate with cortical erosions, sclerosis and thinning of the S1 vertebral body. As well as the urinary bladder that was thickened circumferentially. Hemoglobin has dropped Sarah Colon little bit GI feels that the CT findings are in the setting of fecal impaction and due to stool inflammation from the fecal impaction. They feel that her anemia worsens and she will need [name] flex sig with enemas for prep prior to discharge to reevaluate the rectum after resolution of the fecal impaction. We will escalate antibiotics given her UTI.  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 Her renal function continues to worsen and we are doing further work-up will obtain [name] bladder scan, renal ultrasound and started the patient back on gentle IV fluids with normal saline at 25 mils per hour. We are to place [name] Foley catheter for strict I's and O's however patient refused this. We will continue monitor and will notify nephrology about her worsening renal function.  Nephrology feels that Contrast from last CT scan caused her AKI and GI has signed off and feel as if her Abdominal Pain is multifactorial. They are going to hold off on Flex Sigmoidoscopy.  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)   Chronic diastolic heart failure (HCC)   Diarrhea   Renal insufficiency   Hypertension associated with diabetes (HCC)   Paroxysmal atrial fibrillation (HCC)   Hypothyroidism   Symptomatic anemia   Abdominal pain   CHF (congestive heart  failure) ([place])   Acute metabolic encephalopathy   Other cirrhosis of liver ([place])   Palliative care by specialist   Goals of care, counseling/discussion  1 symptomatic anemia, concern for GI bleed Hb on 03/27/2020 was 3.1 S/p 3 units pRBC  Hb relatively stable to 8.2 On xarelto prior to admission  Upper endoscopy with normal mucosa in  stomach (biopsied).  Normal duodenal bulb, first portion of duodenum and second portion of duodenum.  Colonoscopy with inadequate prep - perianal skin tags, three 2-5 mm polyps removed with hot snare, six 3-8 mm polyps removed with hot snare, three 5-9 mm polyps removed with hot snare Follow pathology -- tubular adenoma, no high grade dysplasia or carcinoma (11/3).  11/1, benign gastric mucosa, negative for H pylori. Needs repeat colonoscopy in 6 months due to poor prep  Patient currently on Sarah Colon soft diet.   Unable to resume Xarelto due to renal function - Eliquis started at 2.5 mg twice daily.  CT abdomen and pelvis was done which showed focal rectal wall thickening, acute cellular fluid as well as circumferential thickened bladder.  GI was reconsulted and patient given Casyn Becvar soapsuds enema due to concerns for fecal impaction..  Per GI if continued drop in hemoglobin will need Sarah Colon flex sigmoidoscopy.  Currently holding off on flex sigmoidoscopy.  GI has signed off.  Repeat H&H in the morning.  Follow.   2.  Left lower quadrant abdominal pain  fecal impaction  Providencia stuartii and VRE UTI CT abdomen and pelvis 11/5 with persistent mild perirectal and presacral fat stranding of unclear etiology, suggestion of possible focal posterior rectal wall thickening that could represent Arad Burston real lesion vs under distension of the rectal wall.   Patient noted to have had Sarah Colon fever with Sarah Colon leukocytosis.  Fever likely secondary from UTI.    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  Status post full course IV antibiotics.  Patient was seen in consultation by GI who felt patient had [name] component of fecal impaction causing her abdominal pain.  Soapsuds enema was ordered early on during the hospitalization.  Patient with complaints of diffuse abdominal pain and initially noted not to have had Sarah Colon bowel movement in several days.  Patient status post enema and having bowel movements with clinical improvement with  abdominal pain.  Continue lactulose.  Patient refusing MiraLAX and as such has been changed to as needed.  Supportive care.   3.  Liver cirrhosis New diagnosis.  MRI of abdomen was degraded exam however no concerning lesions noted repeat MRI recommended which can be done in the outpatient setting.  Patient noted to be refusing lactulose today.  Continue Xifaxan and lactulose.  Outpatient follow-up with GI.   4.  Acute metabolic encephalopathy Likely secondary to symptomatic profound anemia.  Ammonia levels within normal limits.  Clinical improvement and likely at baseline.  5.  Acute renal failure on chronic kidney disease stage IV Baseline creatinine approximately 2. Per renal, likely contrast related.  Creatinine peaked at 4.86, downtrending Appreciate renal assistance - low dose lasix  7.  Metabolic acidosis Likely secondary to acute kidney injury.  Was improving however trending back down.  On bicarb tablets.  Per nephrology.    8.  Hyponatremia ??  SIADH.  Patient on 1200 cc fluid restriction per nephrology.  Hyponatremia improving. Resolved.  9.  Hyperkalemia Resolved   10.  Acute on chronic diastolic CHF Patient noted during the hospitalization early on to be volume overloaded on examination with lower  extremity edema.  BNP elevated at 330 however patient with chronic kidney disease.  Pleural effusions noted on chest x-ray.  Patient status post intermittent diuresis with clinical improvement.  Patient with no respiratory symptoms.  Patient with lower extremity edema.  IV fluids have been saline lock.  Follow.  Low dose lasix per renal  11.  Hypothyroidism Continue Synthroid.   12.  Well-controlled diabetes mellitus type 2 Hemoglobin A1c 5.6.  CBG 115 this morning.  Sliding scale insulin.  13.  Paroxysmal atrial fibrillation Continue Cardizem and Lopressor for rate control.  Hemoglobin stable.  Xarelto not resumed due to renal function.  Eliquis 2.5 mg twice daily for  anticoagulation.  Outpatient follow-up.  14.  Hypokalemia Replace and follow  15.  Leukocytosis Likely reactive.  Patient afebrile.  Status post full course of antibiotic treatment for UTI.  Patient with no respiratory symptoms.  Leukocytosis trending down.  No need for any further antibiotics at this time.  16.  Right upper extremity edema Follow RUE [place].  17. Sacral ulcer, POA Pressure Injury 04/06/2020 Sacrum Medial Stage III -  Full thickness tissue loss. Subcutaneous fat may be visible but bone, tendon or muscle are NOT exposed. (Active)  04/22/2020 1149  Location: Sacrum  Location Orientation: Medial  Staging: Stage III -  Full thickness tissue loss. Subcutaneous fat may be visible but bone, tendon or muscle are NOT exposed.  Wound Description (Comments):   Present on Admission: Yes   GOC: appreciate palliative assistance - see 11/15 note  DVT prophylaxis :eliqusi Code Status: DNR Family Communication: none at bedside Disposition:   Status is: Inpatient  Remains inpatient appropriate because:Inpatient level of care appropriate due to severity of illness   Dispo: The patient is from: Home              Anticipated d/c is to: SNF              Anticipated d/c date is: > 3 days              Patient currently is not medically stable to d/c.  Consultants:   Renal  GI  palliative  Procedures:  UE [place] Summary:    Right:  No evidence of deep vein thrombosis in the upper extremity. No evidence of  superficial vein thrombosis in the upper extremity.    Left:  No evidence of thrombosis in the subclavian.   Echo IMPRESSIONS    1. Left ventricular ejection fraction, by estimation, is 65 to 70%. The  left ventricle has normal function. The left ventricle has no regional  wall motion abnormalities. There is mild concentric left ventricular  hypertrophy. Left ventricular diastolic  parameters are consistent with Grade I diastolic dysfunction (impaired    relaxation). Elevated left ventricular end-diastolic pressure.  2. Right ventricular systolic function is normal. The right ventricular  size is normal. There is moderately elevated pulmonary artery systolic  pressure. The estimated right ventricular systolic pressure is 14.9 mmHg.  3. The mitral valve is normal in structure. Mild mitral valve  regurgitation. No evidence of mitral stenosis. Moderate mitral annular  calcification.  4. Tricuspid valve regurgitation is mild to moderate.  5. The aortic valve is tricuspid. Aortic valve regurgitation is not  visualized. Mild to moderate aortic valve sclerosis/calcification is  present, without any evidence of aortic stenosis.  6. The inferior vena cava is dilated in size with >50% respiratory  variability, suggesting right atrial pressure of 8 mmHg.   Colonoscopy Impression - Preparation  of the colon was inadequate. - Perianal skin tags found on perianal exam. - Stool in the rectum, in the recto-sigmoid colon, in the sigmoid colon and in the descending colon. - Three 2 to 5 mm polyps in the cecum, removed with Jovannie Ulibarri hot snare. Resected and retrieved. - Six 3 to 8 mm polyps in the ascending colon, removed with Antonieta Slaven hot snare. Resected and retrieved. - Three 5 to 9 mm polyps in the transverse colon, removed with Hattye Siegfried hot snare. Resected and Retrieved. Recommendation - Return patient to hospital ward for ongoing care. - Soft diet. - Continue present medications. - Await pathology results. - Repeat colonoscopy in 6 months because the bowel preparation was suboptimal. - Return to GI office in 2 months.  Impression - Z-line regular, 38 cm from the incisors. - Normal mucosa was found in the entire stomach. Biopsied. - Normal duodenal bulb, first portion of the duodenum and second portion of the Duodenum. Recommendation - Return patient to hospital ward for ongoing care. - Clear liquid diet. - Continue present medications. - Perform Snigdha Howser  colonoscopy tomorrow.  Antimicrobials:  Anti-infectives (From admission, onward)   Start     Dose/Rate Route Frequency Ordered Stop   04/06/20 1430  piperacillin-tazobactam (ZOSYN) IVPB 2.25 g        2.25 g 100 mL/hr over 30 Minutes Intravenous Every 6 hours 04/06/20 1320 04/11/20 2359   04/05/20 1930  meropenem (MERREM) 1 g in sodium chloride 0.9 % 100 mL IVPB  Status:  Discontinued        1 g 200 mL/hr over 30 Minutes Intravenous Every 12 hours 04/05/20 1832 04/06/20 1320   04/04/20 1000  Ampicillin-Sulbactam (UNASYN) 3 g in sodium chloride 0.9 % 100 mL IVPB  Status:  Discontinued        3 g 200 mL/hr over 30 Minutes Intravenous Every 12 hours 04/04/20 0851 04/05/20 1829   03/30/20 1400  rifaximin (XIFAXAN) tablet 550 mg        550 mg Oral 2 times daily 03/30/20 1136       Subjective: No new complaints  Objective: Vitals:   04/15/20 2042 04/16/20 0447 04/16/20 0644 04/16/20 1353  BP: (!) 150/64 138/64  139/61  Pulse: 83 75  60  Resp: 18   18  Temp: 98.3 F (36.8 C) 98.7 F (37.1 C)  98.5 F (36.9 C)  TempSrc: Oral Oral  Oral  SpO2: 97% 96%  98%  Weight:   68.9 kg   Height:        Intake/Output Summary (Last 24 hours) at 04/16/2020 1916 Last data filed at 04/16/2020 1749 Gross per 24 hour  Intake 900 ml  Output 825 ml  Net 75 ml   Filed Weights   04/14/20 0347 04/15/20 0500 04/16/20 0644  Weight: 68.6 kg 68.1 kg 68.9 kg    Examination:  General exam: Appears calm and comfortable  Respiratory system: Clear to auscultation. Respiratory effort normal. Cardiovascular system: S1 & S2 heard, RRR.  Gastrointestinal system: Abdomen is nondistended, soft and nontender Central nervous system: Alert and oriented. No focal neurological deficits. Extremities: RUE edema,> LE edema Skin: No rashes, lesions or ulcers Psychiatry: Judgement and insight appear normal. Mood & affect appropriate.     Data Reviewed: I have personally reviewed following labs and imaging  studies  CBC: Recent Labs  Lab 04/11/20 0427 04/11/20 0427 04/12/20 1130 04/13/20 1031 04/14/20 0931 04/15/20 0744 04/16/20 0450  WBC 14.7*  --  15.8* 18.3* 16.0*  --   --  NEUTROABS  --   --   --   --  9.9*  --   --   HGB 9.1*   < > 9.1* 9.3* 8.4* 8.9* 8.2*  HCT 28.6*   < > 28.4* 28.9* 25.2* 27.5* 25.3*  MCV 77.7*  --  76.8* 77.1* 76.6*  --   --   PLT 472*  --  602* 689* 652*  --   --    < > = values in this interval not displayed.    Basic Metabolic Panel: Recent Labs  Lab 04/11/20 0427 04/12/20 1130 04/13/20 1031 04/15/20 0744 04/16/20 0450  NA 138 138 133* 135 136  K 3.2* 3.2* 3.1* 3.4* 3.5  CL 110 108 105 106 107  CO2 15* 17* 19* 15* 17*  GLUCOSE 104* 108* 123* 121* 108*  BUN 46* 46* 46* 39* 40*  CREATININE 4.50* 4.86* 4.82* 4.55* 4.17*  CALCIUM 8.1* 8.0* 7.8* 8.2* 8.2*  PHOS 6.2* 6.4* 5.9* 6.2* 6.1*    GFR: Estimated Creatinine Clearance: 10.3 mL/min (Donyae Kohn) (by C-G formula based on SCr of 4.17 mg/dL (H)).  Liver Function Tests: Recent Labs  Lab 04/11/20 0427 04/12/20 1130 04/13/20 1031 04/15/20 0744 04/16/20 0450  ALBUMIN 2.2* 2.3* 2.3* 2.6* 2.6*    CBG: Recent Labs  Lab 04/15/20 1642 04/15/20 2039 04/16/20 0808 04/16/20 1206 04/16/20 1654  GLUCAP 129* 120* 119* 124* 144*     No results found for this or any previous visit (from the past 240 hour(s)).       Radiology Studies: VAS [place] UPPER EXTREMITY VENOUS DUPLEX  Result Date: 04/16/2020 UPPER VENOUS STUDY  Indications: Right upper extremity edema, s/p right midline removal after infiltration Limitations: Poor ultrasound/tissue interface. Comparison Study: No prior study Performing Technologist: Maudry Mayhew MHA, RDMS, RVT, RDCS  Examination Guidelines: Kyen Taite complete evaluation includes B-mode imaging, spectral Doppler, color Doppler, and power Doppler as needed of all accessible portions of each vessel. Bilateral testing is considered an integral part of Khiyan Crace complete examination.  Limited examinations for reoccurring indications may be performed as noted.  Right Findings: +----------+------------+---------+-----------+----------+-------+ RIGHT     CompressiblePhasicitySpontaneousPropertiesSummary +----------+------------+---------+-----------+----------+-------+ IJV           Full       Yes       Yes                      +----------+------------+---------+-----------+----------+-------+ Subclavian    Full       Yes       Yes                      +----------+------------+---------+-----------+----------+-------+ Axillary      Full       Yes       Yes                      +----------+------------+---------+-----------+----------+-------+ Brachial      Full       Yes       Yes                      +----------+------------+---------+-----------+----------+-------+ Radial        Full                                          +----------+------------+---------+-----------+----------+-------+ Ulnar         Full                                          +----------+------------+---------+-----------+----------+-------+  Cephalic      Full                                          +----------+------------+---------+-----------+----------+-------+ Basilic       Full                                          +----------+------------+---------+-----------+----------+-------+  Left Findings: +----------+------------+---------+-----------+----------+-------+ LEFT      CompressiblePhasicitySpontaneousPropertiesSummary +----------+------------+---------+-----------+----------+-------+ Subclavian               Yes       Yes                      +----------+------------+---------+-----------+----------+-------+  Summary:  Right: No evidence of deep vein thrombosis in the upper extremity. No evidence of superficial vein thrombosis in the upper extremity.  Left: No evidence of thrombosis in the subclavian.  *See table(s) above for  measurements and observations.    Preliminary         Scheduled Meds: . apixaban  2.5 mg Oral BID  . diltiazem  120 mg Oral Daily  . furosemide  20 mg Oral BID  . gabapentin  300 mg Oral QHS  . guaiFENesin  1,200 mg Oral BID  . insulin aspart  0-9 Units Subcutaneous TID WC  . lactulose  10 g Oral BID  . levothyroxine  88 mcg Oral Q0600  . metoprolol tartrate  25 mg Oral BID  . pantoprazole  40 mg Oral Daily  . rifaximin  550 mg Oral BID  . sodium bicarbonate  1,300 mg Oral TID  . sodium chloride flush  3 mL Intravenous Q12H   Continuous Infusions: . sodium chloride Stopped (04/01/20 0906)     LOS: 18 days    Time spent: over 30 min    Fayrene Helper, MD [facility]   To contact the attending provider between 7A-7P or the covering provider during after hours 7P-7A, please log into the web site [email or web address] and access using universal [place] password for that web site. If you do not have the password, please call the hospital operator.  04/16/2020, 7:16 PM

## 2020-04-16 NOTE — Progress Notes (Signed)
[facility] Progress Note   Subjective:   UOP picking up 450 cc yest and creat down 4.1 today w/ BUN 40.  Pt is more alert and awake, states that she "feels better" today.   Objective Vitals:   04/15/20 2042 04/16/20 0447 04/16/20 0644 04/16/20 1353  BP: (!) 150/64 138/64  139/61  Pulse: 83 75  60  Resp: 18   18  Temp: 98.3 F (36.8 C) 98.7 F (37.1 C)  98.5 F (36.9 C)  TempSrc: Oral Oral  Oral  SpO2: 97% 96%  98%  Weight:   68.9 kg   Height:       Physical Exam  General: lying in bed, more awake and alert Lungs: normal WOB GU: purewick Ext: 2+  bilat LE edema, RUE 2+ edema Neuro: nonfocal responds approp, bed bound  Additional Objective Labs: Basic Metabolic Panel: Recent Labs  Lab 04/13/20 1031 04/15/20 0744 04/16/20 0450  NA 133* 135 136  K 3.1* 3.4* 3.5  CL 105 106 107  CO2 19* 15* 17*  GLUCOSE 123* 121* 108*  BUN 46* 39* 40*  CREATININE 4.82* 4.55* 4.17*  CALCIUM 7.8* 8.2* 8.2*  PHOS 5.9* 6.2* 6.1*   Liver Function Tests: Recent Labs  Lab 04/13/20 1031 04/15/20 0744 04/16/20 0450  ALBUMIN 2.3* 2.6* 2.6*   No results for input(s): LIPASE, AMYLASE in the last 168 hours. CBC: Recent Labs  Lab 04/11/20 0427 04/11/20 0427 04/12/20 1130 04/12/20 1130 04/13/20 1031 04/13/20 1031 04/14/20 0931 04/15/20 0744 04/16/20 0450  WBC 14.7*   < > 15.8*  --  18.3*  --  16.0*  --   --   NEUTROABS  --   --   --   --   --   --  9.9*  --   --   HGB 9.1*   < > 9.1*   < > 9.3*   < > 8.4* 8.9* 8.2*  HCT 28.6*   < > 28.4*   < > 28.9*   < > 25.2* 27.5* 25.3*  MCV 77.7*  --  76.8*  --  77.1*  --  76.6*  --   --   PLT 472*   < > 602*  --  689*  --  652*  --   --    < > = values in this interval not displayed.   Medications: . sodium chloride Stopped (04/01/20 0906)   . apixaban  2.5 mg Oral BID  . diltiazem  120 mg Oral Daily  . gabapentin  300 mg Oral QHS  . guaiFENesin  1,200 mg Oral BID  . insulin aspart  0-9 Units Subcutaneous TID WC  .  lactulose  10 g Oral BID  . levothyroxine  88 mcg Oral Q0600  . metoprolol tartrate  25 mg Oral BID  . pantoprazole  40 mg Oral Daily  . rifaximin  550 mg Oral BID  . sodium bicarbonate  1,300 mg Oral TID  . sodium chloride flush  3 mL Intravenous Q12H    Dialysis Orders:  Assessment/Plan 1. AKI on CKD 4:  b/l creat 1.5- 1.8 from early 2021, eGFR 26- 34 ml/min. Cr was at baseline on presentation but developed AKI here.  No hypotension.  The only potential insult was IV contrast administration on 11/5.  She does have a UTI that is being treated but no evidence of AIN. She is vol overloaded, up 6-7kg from admit. Creat peaked at 4.8 on 11/13 and is down to 4.8 yest  and 4.17 today. Pt is more alert, appears to be recovering. Will give low dose po lasix to assist in vol overload. No other suggestions, will follow. If continues to improve she could possibly be ready for d/c in another 1- 2 days from our standpoint.  2. Multiorganism UTI:  Antibiotics per primary 3. Anemia:  S/p transfusion - GI eval, no frank bleeding.  Further transfusion per primary.  Follows with hematology for leukocytosis, perhaps an ESA could be considered if Hb trending down but it's stable in the 9s at the moment. 4. Metabolic acidosis: secondary to AKI, on po bicarb.   5. Hyponatremia: resolved  6. Cirrhosis: new dx based on imaging here.  On lactulose and rifaxin. Outpt f/u planned. 7. DNR - also given her refusal of multiple interventions and poor baseline functional status pt is not a dialysis candidate    Kelly Splinter, MD 04/16/2020, 2:05 PM

## 2020-04-16 NOTE — Progress Notes (Signed)
Physical Therapy Treatment Patient Details Name: Sarah Colon MRN: [id number] DOB: [birth year] Today's Date: 04/16/2020    History of Present Illness Pt is 74 yo female with PMH including DM, CHF, HTN, afib, kidney disease, and weakness in legs (upon review of prior PT notes -noted pt with spinal cord infarct T3-T11 in 2017). Pt presenting to ED with SOB, chest pain, and fatigue.  Found to have a hgb of 3.1 and has received 4 units of PRBC.  Pt had EGD that was significant.  Attempted colonscopy but with poor prep and GI recommending capsule endoscopy    PT Comments    The patient is awake, does appear drowsy at times. . Patient pleasant and participated in U/LE exercises while in bed and assisted with repositioning. Continue PT for mobility and strengthening.  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, right leg plegic    Mobility  Bed Mobility Overal bed mobility: Needs Assistance Bed Mobility: Rolling Rolling: Max assist         General bed mobility comments: patient able to rech for each rail, max assist with bed pad to roll to place pillow  Transfers                 General transfer comment: NT  Ambulation/Gait                 Stairs             Wheelchair Mobility    Modified Rankin (Stroke Patients Only)       Balance                                            Cognition Arousal/Alertness: Lethargic Behavior During Therapy: WFL for tasks assessed/performed Overall Cognitive Status: No family/caregiver present to determine baseline cognitive functioning                                 General Comments: awake ns to interact, thought she was in [facility],  states she is 73, when she knows her BD.       Exercises General Exercises - Upper Extremity Shoulder Flexion: AAROM;Both;10 reps Shoulder ABduction: AAROM;Both;10 reps Shoulder Horizontal ABduction: AAROM;Both;10 reps General Exercises - Lower Extremity Ankle Circles/Pumps: PROM;Right;AROM;Left;10 reps Long Arc Quad: AAROM;Left;10 reps;Supine Heel Slides: AAROM;Left;PROM;Right;10 reps;Supine Hip ABduction/ADduction: AROM;Left;Right;PROM;10 reps    General Comments        Pertinent Vitals/Pain Pain Assessment: No/denies pain    Home Living                      Prior Function            PT Goals (current goals can now be found in the care plan section) Progress towards PT goals: Progressing toward goals    Frequency    Min 2X/week      PT Plan Current plan remains appropriate    Co-evaluation              AM-PAC PT "6 Clicks" Mobility   Outcome Measure  Help needed turning from your back to your  side while in a flat bed without using bedrails?: A Lot Help needed moving from lying on your back to sitting on the side of a flat bed without using bedrails?: Total Help needed moving to and from a bed to a chair (including a wheelchair)?: Total Help needed standing up from a chair using your arms (e.g., wheelchair or bedside chair)?: Total Help needed to walk in hospital room?: Total Help needed climbing 3-5 steps with a railing? : Total 6 Click Score: 7    End of Session   Activity Tolerance: Patient limited by fatigue Patient left: in bed;with call bell/phone within reach Nurse Communication: Need for lift equipment;Mobility status PT Visit Diagnosis: Muscle weakness (generalized) (M62.81);Unsteadiness on feet (R26.81)     Time: 2224-1146 PT Time Calculation (min) (ACUTE ONLY): 32 min  Charges:  $Therapeutic Exercise: 8-22 mins $Therapeutic Activity: 8-22 mins                     Tresa Endo PT Acute Rehabilitation Services Pager [phone] Office [phone]    Claretha Cooper 04/16/2020, 4:24 PM

## 2020-04-16 NOTE — Progress Notes (Signed)
Right upper extremity venous duplex completed. Refer to "CV Proc" under chart review to view preliminary results.  04/16/2020 3:59 PM Kelby Aline., MHA, RVT, RDCS, RDMS

## 2020-04-16 NOTE — Plan of Care (Signed)
  Problem: Clinical Measurements: Goal: Will remain free from infection Outcome: Progressing   Problem: Skin Integrity: Goal: Risk for impaired skin integrity will decrease Outcome: Progressing   

## 2020-04-17 DIAGNOSIS — D649 Anemia, unspecified: Secondary | ICD-10-CM | POA: Diagnosis not present

## 2020-04-17 LAB — CBC WITH DIFFERENTIAL/PLATELET
Abs Immature Granulocytes: 0.13 10*3/uL — ABNORMAL HIGH (ref 0.00–0.07)
Basophils Absolute: 0.1 10*3/uL (ref 0.0–0.1)
Basophils Relative: 1 %
Eosinophils Absolute: 0.5 10*3/uL (ref 0.0–0.5)
Eosinophils Relative: 3 %
HCT: 26.1 % — ABNORMAL LOW (ref 36.0–46.0)
Hemoglobin: 8.7 g/dL — ABNORMAL LOW (ref 12.0–15.0)
Immature Granulocytes: 1 %
Lymphocytes Relative: 21 %
Lymphs Abs: 3.3 10*3/uL (ref 0.7–4.0)
MCH: 25.2 pg — ABNORMAL LOW (ref 26.0–34.0)
MCHC: 33.3 g/dL (ref 30.0–36.0)
MCV: 75.7 fL — ABNORMAL LOW (ref 80.0–100.0)
Monocytes Absolute: 1.2 10*3/uL — ABNORMAL HIGH (ref 0.1–1.0)
Monocytes Relative: 7 %
Neutro Abs: 10.5 10*3/uL — ABNORMAL HIGH (ref 1.7–7.7)
Neutrophils Relative %: 67 %
Platelets: 697 10*3/uL — ABNORMAL HIGH (ref 150–400)
RBC: 3.45 MIL/uL — ABNORMAL LOW (ref 3.87–5.11)
RDW: 30 % — ABNORMAL HIGH (ref 11.5–15.5)
WBC: 15.7 10*3/uL — ABNORMAL HIGH (ref 4.0–10.5)
nRBC: 0 % (ref 0.0–0.2)

## 2020-04-17 LAB — COMPREHENSIVE METABOLIC PANEL
ALT: 16 U/L (ref 0–44)
AST: 45 U/L — ABNORMAL HIGH (ref 15–41)
Albumin: 2.6 g/dL — ABNORMAL LOW (ref 3.5–5.0)
Alkaline Phosphatase: 188 U/L — ABNORMAL HIGH (ref 38–126)
Anion gap: 14 (ref 5–15)
BUN: 38 mg/dL — ABNORMAL HIGH (ref 8–23)
CO2: 19 mmol/L — ABNORMAL LOW (ref 22–32)
Calcium: 8.3 mg/dL — ABNORMAL LOW (ref 8.9–10.3)
Chloride: 105 mmol/L (ref 98–111)
Creatinine, Ser: 3.95 mg/dL — ABNORMAL HIGH (ref 0.44–1.00)
GFR, Estimated: 11 mL/min — ABNORMAL LOW (ref >60)
Glucose, Bld: 120 mg/dL — ABNORMAL HIGH (ref 70–99)
Potassium: 3.4 mmol/L — ABNORMAL LOW (ref 3.5–5.1)
Sodium: 138 mmol/L (ref 135–145)
Total Bilirubin: 0.6 mg/dL (ref 0.3–1.2)
Total Protein: 6.8 g/dL (ref 6.5–8.1)

## 2020-04-17 LAB — IRON AND TIBC
Iron: 24 ug/dL — ABNORMAL LOW (ref 28–170)
Saturation Ratios: 8 % — ABNORMAL LOW (ref 10.4–31.8)
TIBC: 289 ug/dL (ref 250–450)
UIBC: 265 ug/dL

## 2020-04-17 LAB — GLUCOSE, CAPILLARY
Glucose-Capillary: 115 mg/dL — ABNORMAL HIGH (ref 70–99)
Glucose-Capillary: 111 mg/dL — ABNORMAL HIGH (ref 70–99)
Glucose-Capillary: 154 mg/dL — ABNORMAL HIGH (ref 70–99)
Glucose-Capillary: 119 mg/dL — ABNORMAL HIGH (ref 70–99)

## 2020-04-17 LAB — PHOSPHORUS: Phosphorus: 6.3 mg/dL — ABNORMAL HIGH (ref 2.5–4.6)

## 2020-04-17 LAB — FOLATE: Folate: 12.7 ng/mL (ref >5.9)

## 2020-04-17 LAB — MAGNESIUM: Magnesium: 1.8 mg/dL (ref 1.7–2.4)

## 2020-04-17 LAB — PROTIME-INR
INR: 1.6 — ABNORMAL HIGH (ref 0.8–1.2)
Prothrombin Time: 18.7 seconds — ABNORMAL HIGH (ref 11.4–15.2)

## 2020-04-17 LAB — VITAMIN B12: Vitamin B-12: 1078 pg/mL — ABNORMAL HIGH (ref 180–914)

## 2020-04-17 LAB — FERRITIN: Ferritin: 16 ng/mL (ref 11–307)

## 2020-04-17 MED ORDER — FERROUS SULFATE 325 (65 FE) MG PO TABS
325.0000 mg | ORAL_TABLET | Freq: Every day | ORAL | Status: DC
  Administered 2020-04-18 – 2020-04-25 (×6): 325 mg via ORAL
  Filled 2020-04-17 (×7): qty 1

## 2020-04-17 NOTE — Progress Notes (Signed)
PROGRESS NOTE    Sarah Colon  IDP:[id number] DOB: 09/11/1945 DOA: 03/14/2020 PCP: Patient, No Pcp Per   Chief Complaint  Patient presents with   Cough   Shortness of Breath    Brief Narrative:  The patient is Sarah Colon 74 year old African-American female with Sarah Colon past medical history significant for but not limited to diabetes mellitus type 2, diastolic CHF, hypertension, paroxysmal atrial fibrillation, chronic kidney disease stage IV with Yanai Hobson baseline creatinine of 1.5-1.8 as well as other comorbidities who presented to the ED with shortness of breath, chest pain, fatigue over the last week. She also reported Abbe Bula poor appetite. She is found to have Florina Glas hemoglobin of 3 on admission. There are no reports of blood in her stool or dark or tarry stools. Fecal occult test in the ED was positive. GI was consulted and she was admitted to the hospitalist service. She normally lives in SNF is nonambulatory and in the wheelchair. She underwent EGD and colonoscopy without evidence of any bleeding. She had multiple polyp removal from her colonoscopy today. Her prep was poor. GI recommending resuming Xarelto 04/03/20 and she received another dose of diuresis yesterday given her volume overload. Her Xarelto is now resumed today but this evening on 04/04/2019 when she spiked Sherrilynn Gudgel temperature and she does have Jamas Jaquay leukocytosis which is chronic but we will panculture to rule out infection.  She is complaining of some abdominal pain so we will get Lichelle Viets CT of the abdomen pelvis. Chest x-ray shows possible pneumonia so she was started on Unasyn and SLP was consulted given the location being the Right Lung Base.  **After further workup it appears the patient has Natajah Derderian UTI with Providencia and Entercoccus Faecalis. We will escalate her antibiotics to IV meropenem and hold off of Vancomycin for now. Repeat CT scan showed persistent mild perirectal and presacral fat stranding of unclear etiology and presacral rectal findings  associate with cortical erosions, sclerosis and thinning of the S1 vertebral body. As well as the urinary bladder that was thickened circumferentially. Hemoglobin has dropped Zyann Mabry little bit GI feels that the CT findings are in the setting of fecal impaction and due to stool inflammation from the fecal impaction. They feel that her anemia worsens and she will need Lilliam Chamblee flex sig with enemas for prep prior to discharge to reevaluate the rectum after resolution of the fecal impaction. We will escalate antibiotics given her UTI.  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 Her renal function continues to worsen and we are doing further work-up will obtain Reign Bartnick bladder scan, renal ultrasound and started the patient back on gentle IV fluids with normal saline at 25 mils per hour. We are to place Slayton Lubitz Foley catheter for strict I's and O's however patient refused this. We will continue monitor and will notify nephrology about her worsening renal function.  Nephrology feels that Contrast from last CT scan caused her AKI and GI has signed off and feel as if her Abdominal Pain is multifactorial. They are going to hold off on Flex Sigmoidoscopy.  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  Acute renal failure on chronic kidney disease stage IV   Volume overload   Acute on Chronic Diastolic HF Baseline creatinine approximately 2. Per renal, likely contrast related.    Creatinine peaked at 4.86, downtrending today Appreciate renal assistance - low dose lasix Weight up to 68.9 from 60.3 on admission Defer diuresis to renal in setting of AKI  1 symptomatic anemia, concern for GI bleed   Iron Def Anemia Hb on 03/09/2020 was 3.1 S/p 3 units pRBC  Hb relatively stable to 8.7 Labs c/w iron def anemia -> start iron supplementation, follow outpatient  On xarelto prior to admission  Upper endoscopy with normal mucosa in stomach (biopsied).  Normal duodenal bulb, first portion of duodenum and second portion of duodenum.  Colonoscopy with inadequate prep - perianal skin tags, three 2-5 mm polyps removed with hot snare, six 3-8 mm polyps removed with hot snare, three 5-9 mm polyps removed with hot snare Follow pathology -- tubular adenoma, no high grade dysplasia or carcinoma (11/3).  11/1, benign gastric mucosa, negative for H pylori. Needs repeat colonoscopy in 6 months due to poor prep  Patient currently on Torre Pikus soft diet.   Unable to resume Xarelto due to renal function - Eliquis started at 2.5 mg twice daily.  CT abdomen and pelvis was done which showed focal rectal wall thickening, acute cellular fluid as well as circumferential thickened bladder.  GI was reconsulted and patient given Greydon Betke soapsuds enema due to concerns for fecal impaction..  Per GI if continued drop in hemoglobin will need Taysia Rivere flex sigmoidoscopy.  Currently holding off on flex sigmoidoscopy.  GI has signed off.  Repeat H&H in the morning.  Follow.   2.  Left lower quadrant abdominal pain   Fecal Impaction   Providencia stuartii and VRE UTI CT abdomen and pelvis 11/5 with persistent mild perirectal and presacral fat stranding of unclear etiology, suggestion of possible focal posterior rectal wall thickening that could represent Hardin Hardenbrook real lesion vs under distension of the rectal wall.   Patient noted to have had Leontina Skidmore fever with Jamae Tison leukocytosis.  Fever likely secondary from UTI.    Urine cultures positive for  procidentia stuartii and VRE.  Status post full course IV antibiotics.  Patient was seen in consultation by GI who felt patient had Jessa Stinson component of fecal impaction causing her abdominal pain.  Soapsuds enema was ordered early on during the hospitalization.  Patient with complaints of diffuse abdominal pain and initially noted not to have had Arliss Hepburn bowel movement in several days.  Patient status post enema and having bowel movements with clinical improvement with abdominal pain.  Continue lactulose.  Patient refusing MiraLAX and as such has been changed to as needed.  Supportive care.   3.  Liver cirrhosis New diagnosis.  MRI of abdomen was degraded exam however no concerning lesions noted repeat MRI recommended which can be done in the outpatient setting. Continue Xifaxan and lactulose.  Outpatient follow-up with GI.  Follow acute hepatitis panel  # Early Satiety   Abdominal Discomfort: ? If related to volume overload.  S/p EGD as noted above.  Continue PPI, follow, continue to workup.  Could be representative of biliary colic? (imaging with large peripherally calcified gallstone within gallbladder lumen).   4.  Acute metabolic encephalopathy Likely secondary to symptomatic profound anemia.  Ammonia  levels within normal limits.  Clinical improvement and likely at baseline.  7.  Metabolic acidosis Likely secondary to acute kidney injury.  Was improving however trending back down.  On bicarb tablets.  Per nephrology.    8.  Hyponatremia ??  SIADH.  Patient on 1200 cc fluid restriction per nephrology.  Hyponatremia improving. Resolved.  9.  Hyperkalemia Resolved   11.  Hypothyroidism Continue Synthroid.   12.  Well-controlled diabetes mellitus type 2 Hemoglobin A1c 5.6.  CBG 115 this morning.  Sliding scale insulin.  13.  Paroxysmal atrial fibrillation Continue Cardizem and Lopressor for rate control.  Hemoglobin stable.  Xarelto not resumed due to renal function.  Eliquis 2.5 mg twice  daily for anticoagulation.  Outpatient follow-up.  14.  Hypokalemia Replace and follow  15.  Leukocytosis Likely reactive.  Patient afebrile.  Status post full course of antibiotic treatment for UTI.  Patient with no respiratory symptoms.  Leukocytosis trending down.  No need for any further antibiotics at this time.  16.  Right upper extremity edema Follow RUE [place] - negative for DVT  17. Sacral ulcer, POA Pressure Injury 04/18/2020 Sacrum Medial Stage III -  Full thickness tissue loss. Subcutaneous fat may be visible but bone, tendon or muscle are NOT exposed. (Active)  04/14/2020 1149  Location: Sacrum  Location Orientation: Medial  Staging: Stage III -  Full thickness tissue loss. Subcutaneous fat may be visible but bone, tendon or muscle are NOT exposed.  Wound Description (Comments):   Present on Admission: Yes   GOC: appreciate palliative assistance - see 11/15 note  DVT prophylaxis :eliqusi Code Status: DNR Family Communication: none at bedside Disposition:   Status is: Inpatient  Remains inpatient appropriate because:Inpatient level of care appropriate due to severity of illness   Dispo: The patient is from: Home              Anticipated d/c is to: SNF              Anticipated d/c date is: > 3 days              Patient currently is not medically stable to d/c.  Consultants:   Renal  GI  palliative  Procedures:  UE [place] Summary:    Right:  No evidence of deep vein thrombosis in the upper extremity. No evidence of  superficial vein thrombosis in the upper extremity.    Left:  No evidence of thrombosis in the subclavian.   Echo IMPRESSIONS    1. Left ventricular ejection fraction, by estimation, is 65 to 70%. The  left ventricle has normal function. The left ventricle has no regional  wall motion abnormalities. There is mild concentric left ventricular  hypertrophy. Left ventricular diastolic  parameters are consistent with Grade I diastolic  dysfunction (impaired  relaxation). Elevated left ventricular end-diastolic pressure.  2. Right ventricular systolic function is normal. The right ventricular  size is normal. There is moderately elevated pulmonary artery systolic  pressure. The estimated right ventricular systolic pressure is 70.6 mmHg.  3. The mitral valve is normal in structure. Mild mitral valve  regurgitation. No evidence of mitral stenosis. Moderate mitral annular  calcification.  4. Tricuspid valve regurgitation is mild to moderate.  5. The aortic valve is tricuspid. Aortic valve regurgitation is not  visualized. Mild to moderate aortic valve sclerosis/calcification is  present, without any evidence of aortic stenosis.  6. The inferior vena cava is dilated in size with >50% respiratory  variability, suggesting right  atrial pressure of 8 mmHg.   Colonoscopy Impression - Preparation of the colon was inadequate. - Perianal skin tags found on perianal exam. - Stool in the rectum, in the recto-sigmoid colon, in the sigmoid colon and in the descending colon. - Three 2 to 5 mm polyps in the cecum, removed with Tyreck Bell hot snare. Resected and retrieved. - Six 3 to 8 mm polyps in the ascending colon, removed with Emil Weigold hot snare. Resected and retrieved. - Three 5 to 9 mm polyps in the transverse colon, removed with Geneive Sandstrom hot snare. Resected and Retrieved. Recommendation - Return patient to hospital ward for ongoing care. - Soft diet. - Continue present medications. - Await pathology results. - Repeat colonoscopy in 6 months because the bowel preparation was suboptimal. - Return to GI office in 2 months.  Impression - Z-line regular, 38 cm from the incisors. - Normal mucosa was found in the entire stomach. Biopsied. - Normal duodenal bulb, first portion of the duodenum and second portion of the Duodenum. Recommendation - Return patient to hospital ward for ongoing care. - Clear liquid diet. - Continue present  medications. - Perform Irmalee Riemenschneider colonoscopy tomorrow.  Antimicrobials:  Anti-infectives (From admission, onward)   Start     Dose/Rate Route Frequency Ordered Stop   04/06/20 1430  piperacillin-tazobactam (ZOSYN) IVPB 2.25 g        2.25 g 100 mL/hr over 30 Minutes Intravenous Every 6 hours 04/06/20 1320 04/11/20 2359   04/05/20 1930  meropenem (MERREM) 1 g in sodium chloride 0.9 % 100 mL IVPB  Status:  Discontinued        1 g 200 mL/hr over 30 Minutes Intravenous Every 12 hours 04/05/20 1832 04/06/20 1320   04/04/20 1000  Ampicillin-Sulbactam (UNASYN) 3 g in sodium chloride 0.9 % 100 mL IVPB  Status:  Discontinued        3 g 200 mL/hr over 30 Minutes Intravenous Every 12 hours 04/04/20 0851 04/05/20 1829   03/30/20 1400  rifaximin (XIFAXAN) tablet 550 mg        550 mg Oral 2 times daily 03/30/20 1136       Subjective: C/o early satiety, abdominal discomfort after eating  Objective: Vitals:   04/16/20 1353 04/16/20 2109 04/17/20 0451 04/17/20 1300  BP: 139/61 121/79 (!) 156/63 (!) 181/112  Pulse: 60 86 64 97  Resp: 18 20 20 20   Temp: 98.5 F (36.9 C) 99 F (37.2 C) 98.5 F (36.9 C) 98.9 F (37.2 C)  TempSrc: Oral Oral Oral   SpO2: 98% 98% 95%   Weight:      Height:        Intake/Output Summary (Last 24 hours) at 04/17/2020 1623 Last data filed at 04/17/2020 0900 Gross per 24 hour  Intake 240 ml  Output 1050 ml  Net -810 ml   Filed Weights   04/14/20 0347 04/15/20 0500 04/16/20 0644  Weight: 68.6 kg 68.1 kg 68.9 kg    Examination:  General: No acute distress. Cardiovascular: Heart sounds show Kyreese Chio regular rate, and rhythm. Lungs: Clear to auscultation bilaterally. Abdomen: Soft, nontender, nondistended  Neurological: Alert and oriented 3. Moves all extremities 4. Cranial nerves II through XII grossly intact. Skin: Warm and dry. No rashes or lesions. Extremities: edema to RUE and bilateral LE    Data Reviewed: I have personally reviewed following labs and imaging  studies  CBC: Recent Labs  Lab 04/11/20 0427 04/11/20 0427 04/12/20 1130 04/12/20 1130 04/13/20 1031 04/14/20 0931 04/15/20 0744 04/16/20 0450 04/17/20 0998  WBC 14.7*  --  15.8*  --  18.3* 16.0*  --   --  15.7*  NEUTROABS  --   --   --   --   --  9.9*  --   --  10.5*  HGB 9.1*   < > 9.1*   < > 9.3* 8.4* 8.9* 8.2* 8.7*  HCT 28.6*   < > 28.4*   < > 28.9* 25.2* 27.5* 25.3* 26.1*  MCV 77.7*  --  76.8*  --  77.1* 76.6*  --   --  75.7*  PLT 472*  --  602*  --  689* 652*  --   --  697*   < > = values in this interval not displayed.    Basic Metabolic Panel: Recent Labs  Lab 04/12/20 1130 04/13/20 1031 04/15/20 0744 04/16/20 0450 04/17/20 0650  NA 138 133* 135 136 138  K 3.2* 3.1* 3.4* 3.5 3.4*  CL 108 105 106 107 105  CO2 17* 19* 15* 17* 19*  GLUCOSE 108* 123* 121* 108* 120*  BUN 46* 46* 39* 40* 38*  CREATININE 4.86* 4.82* 4.55* 4.17* 3.95*  CALCIUM 8.0* 7.8* 8.2* 8.2* 8.3*  MG  --   --   --   --  1.8  PHOS 6.4* 5.9* 6.2* 6.1* 6.3*    GFR: Estimated Creatinine Clearance: 10.9 mL/min (Loui Massenburg) (by C-G formula based on SCr of 3.95 mg/dL (H)).  Liver Function Tests: Recent Labs  Lab 04/12/20 1130 04/13/20 1031 04/15/20 0744 04/16/20 0450 04/17/20 0650  AST  --   --   --   --  45*  ALT  --   --   --   --  16  ALKPHOS  --   --   --   --  188*  BILITOT  --   --   --   --  0.6  PROT  --   --   --   --  6.8  ALBUMIN 2.3* 2.3* 2.6* 2.6* 2.6*    CBG: Recent Labs  Lab 04/16/20 1206 04/16/20 1654 04/16/20 2113 04/17/20 0742 04/17/20 1149  GLUCAP 124* 144* 136* 115* 111*     No results found for this or any previous visit (from the past 240 hour(s)).       Radiology Studies: VAS [place] UPPER EXTREMITY VENOUS DUPLEX  Result Date: 04/16/2020 UPPER VENOUS STUDY  Indications: Right upper extremity edema, s/p right midline removal after infiltration Limitations: Poor ultrasound/tissue interface. Comparison Study: No prior study Performing Technologist: Maudry Mayhew MHA, RDMS, RVT, RDCS  Examination Guidelines: Antanisha Mohs complete evaluation includes B-mode imaging, spectral Doppler, color Doppler, and power Doppler as needed of all accessible portions of each vessel. Bilateral testing is considered an integral part of Shantrice Rodenberg complete examination. Limited examinations for reoccurring indications may be performed as noted.  Right Findings: +----------+------------+---------+-----------+----------+-------+  RIGHT      Compressible Phasicity Spontaneous Properties Summary  +----------+------------+---------+-----------+----------+-------+  IJV            Full        Yes        Yes                         +----------+------------+---------+-----------+----------+-------+  Subclavian     Full        Yes        Yes                         +----------+------------+---------+-----------+----------+-------+  Axillary       Full        Yes        Yes                         +----------+------------+---------+-----------+----------+-------+  Brachial       Full        Yes        Yes                         +----------+------------+---------+-----------+----------+-------+  Radial         Full                                               +----------+------------+---------+-----------+----------+-------+  Ulnar          Full                                               +----------+------------+---------+-----------+----------+-------+  Cephalic       Full                                               +----------+------------+---------+-----------+----------+-------+  Basilic        Full                                               +----------+------------+---------+-----------+----------+-------+  Left Findings: +----------+------------+---------+-----------+----------+-------+  LEFT       Compressible Phasicity Spontaneous Properties Summary  +----------+------------+---------+-----------+----------+-------+  Subclavian                 Yes        Yes                          +----------+------------+---------+-----------+----------+-------+  Summary:  Right: No evidence of deep vein thrombosis in the upper extremity. No evidence of superficial vein thrombosis in the upper extremity.  Left: No evidence of thrombosis in the subclavian.  *See table(s) above for measurements and observations.  Diagnosing physician: Harold Barban MD Electronically signed by Harold Barban MD on 04/16/2020 at 8:16:47 PM.    Final         Scheduled Meds:  apixaban  2.5 mg Oral BID   diltiazem  120 mg Oral Daily   furosemide  20 mg Oral BID   gabapentin  300 mg Oral QHS   guaiFENesin  1,200 mg Oral BID   insulin aspart  0-9 Units Subcutaneous TID WC   lactulose  10 g Oral BID   levothyroxine  88 mcg Oral Q0600   metoprolol tartrate  25 mg Oral BID   pantoprazole  40 mg Oral Daily   rifaximin  550 mg Oral BID   sodium bicarbonate  1,300 mg Oral TID   sodium chloride flush  3 mL Intravenous Q12H   Continuous Infusions:  sodium chloride Stopped (04/01/20 0906)     LOS: 19 days  Time spent: over 30 min    Fayrene Helper, MD [facility]   To contact the attending provider between 7A-7P or the covering provider during after hours 7P-7A, please log into the web site [email or web address] and access using universal [place] password for that web site. If you do not have the password, please call the hospital operator.  04/17/2020, 4:23 PM

## 2020-04-17 NOTE — Progress Notes (Signed)
[facility] Progress Note   Subjective:    pt eating better today , no nausea, no c/o's.  Creat down 3.8  Objective Vitals:   04/16/20 1353 04/16/20 2109 04/17/20 0451 04/17/20 1300  BP: 139/61 121/79 (!) 156/63 (!) 181/112  Pulse: 60 86 64 97  Resp: '18 20 20 20  ' Temp: 98.5 F (36.9 C) 99 F (37.2 C) 98.5 F (36.9 C) 98.9 F (37.2 C)  TempSrc: Oral Oral Oral   SpO2: 98% 98% 95%   Weight:      Height:       Physical Exam  General: lying in bed, more awake and alert Lungs: normal WOB GU: purewick Ext: 2+  bilat LE edema, RUE 2+ edema Neuro: nonfocal responds approp, bed bound  Additional Objective Labs: Basic Metabolic Panel: Recent Labs  Lab 04/15/20 0744 04/16/20 0450 04/17/20 0650  NA 135 136 138  K 3.4* 3.5 3.4*  CL 106 107 105  CO2 15* 17* 19*  GLUCOSE 121* 108* 120*  BUN 39* 40* 38*  CREATININE 4.55* 4.17* 3.95*  CALCIUM 8.2* 8.2* 8.3*  PHOS 6.2* 6.1* 6.3*   Liver Function Tests: Recent Labs  Lab 04/15/20 0744 04/16/20 0450 04/17/20 0650  AST  --   --  45*  ALT  --   --  16  ALKPHOS  --   --  188*  BILITOT  --   --  0.6  PROT  --   --  6.8  ALBUMIN 2.6* 2.6* 2.6*   No results for input(s): LIPASE, AMYLASE in the last 168 hours. CBC: Recent Labs  Lab 04/11/20 0427 04/11/20 0427 04/12/20 1130 04/12/20 1130 04/13/20 1031 04/13/20 1031 04/14/20 0931 04/14/20 0931 04/15/20 0744 04/16/20 0450 04/17/20 0650  WBC 14.7*   < > 15.8*   < > 18.3*  --  16.0*  --   --   --  15.7*  NEUTROABS  --   --   --   --   --   --  9.9*  --   --   --  10.5*  HGB 9.1*   < > 9.1*   < > 9.3*   < > 8.4*   < > 8.9* 8.2* 8.7*  HCT 28.6*   < > 28.4*   < > 28.9*   < > 25.2*   < > 27.5* 25.3* 26.1*  MCV 77.7*  --  76.8*  --  77.1*  --  76.6*  --   --   --  75.7*  PLT 472*   < > 602*   < > 689*  --  652*  --   --   --  697*   < > = values in this interval not displayed.   Medications: . sodium chloride Stopped (04/01/20 0906)   . apixaban  2.5  mg Oral BID  . diltiazem  120 mg Oral Daily  . [START ON 04/18/2020] ferrous sulfate  325 mg Oral Q breakfast  . furosemide  20 mg Oral BID  . gabapentin  300 mg Oral QHS  . guaiFENesin  1,200 mg Oral BID  . insulin aspart  0-9 Units Subcutaneous TID WC  . lactulose  10 g Oral BID  . levothyroxine  88 mcg Oral Q0600  . metoprolol tartrate  25 mg Oral BID  . pantoprazole  40 mg Oral Daily  . rifaximin  550 mg Oral BID  . sodium bicarbonate  1,300 mg Oral TID  . sodium chloride flush  3 mL Intravenous Q12H    Dialysis Orders:  Assessment/Plan 1. AKI on CKD 4:  b/l creat 1.5- 1.8 from early 2021, eGFR 26- 34 ml/min. Cr was at baseline on presentation but developed AKI here.  No hypotension.  The only potential insult was IV contrast administration on 11/5.  She does have a UTI that is being treated but no evidence of AIN. She is vol overloaded, up 6-7kg from admit. Creat peaked at 4.8 on 11/13 and continues to improve down to 4.0 yest and 3.8 today. Would consider some po lasix 72m 1-2 x per day when creat better (< 3).  No other suggestions, she hopefully will continue to improve. OK for dc to facility where they can follow this.  Will sign off.  2. Multiorganism UTI:  Antibiotics per primary 3. Anemia:  S/p transfusion - GI eval, no frank bleeding.  Further transfusion per primary.  Follows with hematology for leukocytosis, perhaps an ESA could be considered if Hb trending down but it's stable in the 9s at the moment. 4. Metabolic acidosis: secondary to AKI, on po bicarb.   5. Hyponatremia: resolved  6. Cirrhosis: new dx based on imaging here.  On lactulose and rifaxin. Outpt f/u planned. 7. DNR - also given her refusal of multiple interventions and poor baseline functional status pt is not a dialysis candidate    RKelly Splinter MD 04/17/2020, 5:17 PM

## 2020-04-17 NOTE — Progress Notes (Addendum)
Patient refused morning blood works and patient's care.   Phlebotomy was called back to draw blood. Patient allowed this time.

## 2020-04-18 ENCOUNTER — Inpatient Hospital Stay (HOSPITAL_COMMUNITY): Payer: Medicare Other

## 2020-04-18 DIAGNOSIS — R609 Edema, unspecified: Secondary | ICD-10-CM | POA: Diagnosis not present

## 2020-04-18 LAB — CBC WITH DIFFERENTIAL/PLATELET
Abs Immature Granulocytes: 0.1 10*3/uL — ABNORMAL HIGH (ref 0.00–0.07)
Basophils Absolute: 0.2 10*3/uL — ABNORMAL HIGH (ref 0.0–0.1)
Basophils Relative: 1 %
Eosinophils Absolute: 0.7 10*3/uL — ABNORMAL HIGH (ref 0.0–0.5)
Eosinophils Relative: 4 %
HCT: 26.2 % — ABNORMAL LOW (ref 36.0–46.0)
Hemoglobin: 8.2 g/dL — ABNORMAL LOW (ref 12.0–15.0)
Immature Granulocytes: 1 %
Lymphocytes Relative: 21 %
Lymphs Abs: 3.3 10*3/uL (ref 0.7–4.0)
MCH: 24.8 pg — ABNORMAL LOW (ref 26.0–34.0)
MCHC: 31.3 g/dL (ref 30.0–36.0)
MCV: 79.2 fL — ABNORMAL LOW (ref 80.0–100.0)
Monocytes Absolute: 1.1 10*3/uL — ABNORMAL HIGH (ref 0.1–1.0)
Monocytes Relative: 7 %
Neutro Abs: 10.2 10*3/uL — ABNORMAL HIGH (ref 1.7–7.7)
Neutrophils Relative %: 66 %
Platelets: 632 10*3/uL — ABNORMAL HIGH (ref 150–400)
RBC: 3.31 MIL/uL — ABNORMAL LOW (ref 3.87–5.11)
RDW: 29.2 % — ABNORMAL HIGH (ref 11.5–15.5)
WBC: 15.6 10*3/uL — ABNORMAL HIGH (ref 4.0–10.5)
nRBC: 0 % (ref 0.0–0.2)

## 2020-04-18 LAB — RENAL FUNCTION PANEL
Albumin: 2.4 g/dL — ABNORMAL LOW (ref 3.5–5.0)
Anion gap: 9 (ref 5–15)
BUN: 36 mg/dL — ABNORMAL HIGH (ref 8–23)
CO2: 19 mmol/L — ABNORMAL LOW (ref 22–32)
Calcium: 8.3 mg/dL — ABNORMAL LOW (ref 8.9–10.3)
Chloride: 109 mmol/L (ref 98–111)
Creatinine, Ser: 3.58 mg/dL — ABNORMAL HIGH (ref 0.44–1.00)
GFR, Estimated: 13 mL/min — ABNORMAL LOW (ref >60)
Glucose, Bld: 107 mg/dL — ABNORMAL HIGH (ref 70–99)
Phosphorus: 6 mg/dL — ABNORMAL HIGH (ref 2.5–4.6)
Potassium: 3.2 mmol/L — ABNORMAL LOW (ref 3.5–5.1)
Sodium: 137 mmol/L (ref 135–145)

## 2020-04-18 LAB — HEPATITIS PANEL, ACUTE
HCV Ab: NONREACTIVE
Hep A IgM: UNDETERMINED — AB
Hep B C IgM: NONREACTIVE
Hepatitis B Surface Ag: NONREACTIVE

## 2020-04-18 LAB — COMPREHENSIVE METABOLIC PANEL
ALT: 15 U/L (ref 0–44)
AST: 42 U/L — ABNORMAL HIGH (ref 15–41)
Albumin: 2.4 g/dL — ABNORMAL LOW (ref 3.5–5.0)
Alkaline Phosphatase: 170 U/L — ABNORMAL HIGH (ref 38–126)
Anion gap: 10 (ref 5–15)
BUN: 33 mg/dL — ABNORMAL HIGH (ref 8–23)
CO2: 18 mmol/L — ABNORMAL LOW (ref 22–32)
Calcium: 8.3 mg/dL — ABNORMAL LOW (ref 8.9–10.3)
Chloride: 109 mmol/L (ref 98–111)
Creatinine, Ser: 3.6 mg/dL — ABNORMAL HIGH (ref 0.44–1.00)
GFR, Estimated: 13 mL/min — ABNORMAL LOW (ref >60)
Glucose, Bld: 106 mg/dL — ABNORMAL HIGH (ref 70–99)
Potassium: 3.2 mmol/L — ABNORMAL LOW (ref 3.5–5.1)
Sodium: 137 mmol/L (ref 135–145)
Total Bilirubin: 0.5 mg/dL (ref 0.3–1.2)
Total Protein: 6.1 g/dL — ABNORMAL LOW (ref 6.5–8.1)

## 2020-04-18 LAB — GLUCOSE, CAPILLARY
Glucose-Capillary: 94 mg/dL (ref 70–99)
Glucose-Capillary: 139 mg/dL — ABNORMAL HIGH (ref 70–99)
Glucose-Capillary: 142 mg/dL — ABNORMAL HIGH (ref 70–99)

## 2020-04-18 LAB — RESP PANEL BY RT-PCR (FLU A&B, COVID) ARPGX2
Influenza A by PCR: NEGATIVE
Influenza B by PCR: NEGATIVE
SARS Coronavirus 2 by RT PCR: NEGATIVE

## 2020-04-18 LAB — PHOSPHORUS: Phosphorus: 5.9 mg/dL — ABNORMAL HIGH (ref 2.5–4.6)

## 2020-04-18 LAB — MAGNESIUM: Magnesium: 1.7 mg/dL (ref 1.7–2.4)

## 2020-04-18 MED ORDER — POTASSIUM CHLORIDE CRYS ER 20 MEQ PO TBCR
20.0000 meq | EXTENDED_RELEASE_TABLET | Freq: Once | ORAL | Status: AC
  Administered 2020-04-18: 20 meq via ORAL
  Filled 2020-04-18: qty 1

## 2020-04-18 NOTE — TOC Progression Note (Signed)
[facility]) - Progression Note    Patient Details  Name: Sarah Colon MRN: [id number] Date of Birth: 1945-11-24  [facility] CM/SW Contact  Purcell Mouton, RN Phone Number: 04/18/2020, 12:06 PM  Clinical Narrative:    [name] is able to take pt today.    Expected Discharge Plan: [place] Barriers to Discharge: Continued Medical Work up  Expected Discharge Plan and Services Expected Discharge Plan: [place]   Discharge Planning Services: CM Consult Post Acute Care Choice: Resumption of Svcs/PTA Provider Living arrangements for the past 2 months: [place]                 DME Arranged: N/A         HH Arranged: NA           Social Determinants of Health (SDOH) Interventions    Readmission Risk Interventions Readmission Risk Prevention Plan 04/14/2020  Transportation Screening Complete  Medication Review [employer]) Complete  PCP or Specialist appointment within 3-5 days of discharge Complete  HRI or Home Care Consult Complete  SW Recovery Care/Counseling Consult Complete  Palliative Care Screening Not [place] Complete  Some recent data might be hidden
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
## 2020-04-18 NOTE — Plan of Care (Signed)
  Problem: Clinical Measurements: Goal: Ability to maintain clinical measurements within normal limits will improve Outcome: Progressing   Problem: Nutrition: Goal: Adequate nutrition will be maintained Outcome: Progressing   

## 2020-04-18 NOTE — Progress Notes (Signed)
PROGRESS NOTE    Sarah Colon  SVX:[id number] DOB: [birth year] DOA: 03/12/2020 PCP: Patient, No Pcp Per   Chief Complaint  Patient presents with  . Cough  . Shortness of Breath    Brief Narrative:  The patient is Sarah Colon 74 year old African-American female with Cumi Sanagustin past medical history significant for but not limited to diabetes mellitus type 2, diastolic CHF, hypertension, paroxysmal atrial fibrillation, chronic kidney disease stage IV with Britney Captain baseline creatinine of 1.5-1.8 as well as other comorbidities who presented to the ED with shortness of breath, chest pain, fatigue over the last week. She also reported Sarah Colon poor appetite. She is found to have Sarah Colon hemoglobin of 3 on admission. There are no reports of blood in her stool or dark or tarry stools. Fecal occult test in the ED was positive. GI was consulted and she was admitted to the hospitalist service. She normally lives in SNF is nonambulatory and in the wheelchair. She underwent EGD and colonoscopy without evidence of any bleeding. She had multiple polyp removal from her colonoscopy today. Her prep was poor. GI recommending resuming Xarelto 04/03/20 and she received another dose of diuresis yesterday given her volume overload. Her Xarelto is now resumed today but this evening on 04/04/2019 when she spiked Lenoria Colon temperature and she does have Trevar Boehringer leukocytosis which is chronic but we will panculture to rule out infection.  She is complaining of some abdominal pain so we will get Sarah Colon CT of the abdomen pelvis. Chest x-ray shows possible pneumonia so she was started on Unasyn and SLP was consulted given the location being the Right Lung Base.  **After further workup it appears the patient has Benjamim Harnish UTI with Providencia and Entercoccus Faecalis. We will escalate her antibiotics to IV meropenem and hold off of Vancomycin for now. Repeat CT scan showed persistent mild perirectal and presacral fat stranding of unclear etiology and presacral rectal findings  associate with cortical erosions, sclerosis and thinning of the S1 vertebral body. As well as the urinary bladder that was thickened circumferentially. Hemoglobin has dropped Tyjae Colon little bit GI feels that the CT findings are in the setting of fecal impaction and due to stool inflammation from the fecal impaction. They feel that her anemia worsens and she will need Sarah Colon flex sig with enemas for prep prior to discharge to reevaluate the rectum after resolution of the fecal impaction. We will escalate antibiotics given her UTI.  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 Her renal function continues to worsen and we are doing further work-up will obtain Sarah Colon bladder scan, renal ultrasound and started the patient back on gentle IV fluids with normal saline at 25 mils per hour. We are to place Sarah Colon Foley catheter for strict I's and O's however patient refused this. We will continue monitor and will notify nephrology about her worsening renal function.  Nephrology feels that Contrast from last CT scan caused her AKI and GI has signed off and feel as if her Abdominal Pain is multifactorial. They are going to hold off on Flex Sigmoidoscopy.  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  Acute renal failure on chronic kidney disease stage IV  Volume overload  Acute on Chronic Diastolic HF Baseline creatinine approximately 2. Per renal, likely contrast related.    Creatinine peaked at 4.86, downtrending today Appreciate renal assistance - low dose lasix Weight up to 68.9 from 60.3 on admission Defer diuresis to renal in setting of AKI  1 symptomatic anemia, concern for GI bleed  Iron Def Anemia Hb on 03/09/2020 was 3.1 S/p 3 units pRBC  Hb relatively stable to 8.7 Labs c/w iron def anemia -> start iron supplementation, follow outpatient  On xarelto prior to admission  Upper endoscopy with normal mucosa in stomach (biopsied).  Normal duodenal bulb, first portion of duodenum and second portion of duodenum.  Colonoscopy with inadequate prep - perianal skin tags, three 2-5 mm polyps removed with hot snare, six 3-8 mm polyps removed with hot snare, three 5-9 mm polyps removed with hot snare Follow pathology -- tubular adenoma, no high grade dysplasia or carcinoma (11/3).  11/1, benign gastric mucosa, negative for H pylori. Needs repeat colonoscopy in 6 months due to poor prep  Patient currently on Sarah Colon soft diet.   Unable to resume Xarelto due to renal function - Eliquis started at 2.5 mg twice daily.  CT abdomen and pelvis was done which showed focal rectal wall thickening, acute cellular fluid as well as circumferential thickened bladder.  GI was reconsulted and patient given [name] soapsuds enema due to concerns for fecal impaction..  Per GI if continued drop in hemoglobin will need [name] flex sigmoidoscopy.  Currently holding off on flex sigmoidoscopy.  GI has signed off.  Repeat H&H in the morning.  Follow.   # Early Satiety  Abdominal Discomfort Dysphagia: ? If related to volume overload.  S/p EGD as noted above.  Continue PPI, follow, continue to workup.  Postprandial discomfort be representative of biliary colic? (imaging with large peripherally calcified gallstone within gallbladder lumen).  SLP eval today recommending barium swallow to eval esophageal motility Barium swallow limited -> barium tablet did not pass through distal esophagus - will discuss with GI  in AM  2.  Left lower quadrant abdominal pain  Fecal Impaction  Providencia stuartii and VRE UTI CT abdomen and pelvis 11/5 with persistent mild perirectal and presacral fat stranding of unclear etiology, suggestion of possible focal posterior rectal wall thickening that could represent Niah Heinle real lesion vs under distension of the rectal wall.   Patient noted to have had Daimen Shovlin fever with Jaythan Hinely leukocytosis.  Fever likely secondary from UTI.    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  Status post full course IV antibiotics.  Patient was seen in consultation by GI who felt patient had Evi Mccomb component of fecal impaction causing her abdominal pain.  Soapsuds enema was ordered early on during the hospitalization.  Patient with complaints of diffuse abdominal pain and initially noted not to have had Ainslee Sou bowel movement in several days.  Patient status post enema and having bowel movements with clinical improvement with abdominal pain.  Continue lactulose.  Patient refusing MiraLAX and as such has been changed to as needed.  Supportive care.   3.  Liver cirrhosis New diagnosis.  MRI of abdomen was degraded exam however no concerning lesions noted repeat MRI recommended which can be done in the outpatient setting. Continue Xifaxan and  lactulose.  Outpatient follow-up with GI.  Follow acute hepatitis panel  4.  Acute metabolic encephalopathy Likely secondary to symptomatic profound anemia.  Ammonia levels within normal limits.  Clinical improvement and likely at baseline.  7.  Metabolic acidosis Likely secondary to acute kidney injury.  Was improving however trending back down.  On bicarb tablets.  Per nephrology.    8.  Hyponatremia ??  SIADH.  Patient on 1200 cc fluid restriction per nephrology.  Hyponatremia improving. Resolved.  9.  Hyperkalemia Resolved   11.  Hypothyroidism Continue Synthroid.   12.  Well-controlled diabetes mellitus type 2 Hemoglobin A1c 5.6.  CBG 115 this morning.   Sliding scale insulin.  13.  Paroxysmal atrial fibrillation Continue Cardizem and Lopressor for rate control.  Hemoglobin stable.  Xarelto not resumed due to renal function.  Eliquis 2.5 mg twice daily for anticoagulation.  Outpatient follow-up.  14.  Hypokalemia Replace and follow  15.  Leukocytosis Likely reactive.  Patient afebrile.  Status post full course of antibiotic treatment for UTI.  Patient with no respiratory symptoms.  Leukocytosis trending down.  No need for any further antibiotics at this time.  16.  Right upper extremity edema Follow RUE [place] - negative for DVT  17. Sacral ulcer, POA Pressure Injury 04/20/2020 Sacrum Medial Stage III -  Full thickness tissue loss. Subcutaneous fat may be visible but bone, tendon or muscle are NOT exposed. (Active)  04/22/2020 1149  Location: Sacrum  Location Orientation: Medial  Staging: Stage III -  Full thickness tissue loss. Subcutaneous fat may be visible but bone, tendon or muscle are NOT exposed.  Wound Description (Comments):   Present on Admission: Yes   GOC: appreciate palliative assistance - see 11/15 note  DVT prophylaxis :eliqusi Code Status: DNR Family Communication: none at bedside Disposition:   Status is: Inpatient  Remains inpatient appropriate because:Inpatient level of care appropriate due to severity of illness   Dispo: The patient is from: Home              Anticipated d/c is to: SNF              Anticipated d/c date is: > 3 days              Patient currently is not medically stable to d/c.  Consultants:   Renal  GI  palliative  Procedures:  UE [place] Summary:    Right:  No evidence of deep vein thrombosis in the upper extremity. No evidence of  superficial vein thrombosis in the upper extremity.    Left:  No evidence of thrombosis in the subclavian.   Echo IMPRESSIONS    1. Left ventricular ejection fraction, by estimation, is 65 to 70%. The  left ventricle has normal function. The  left ventricle has no regional  wall motion abnormalities. There is mild concentric left ventricular  hypertrophy. Left ventricular diastolic  parameters are consistent with Grade I diastolic dysfunction (impaired  relaxation). Elevated left ventricular end-diastolic pressure.  2. Right ventricular systolic function is normal. The right ventricular  size is normal. There is moderately elevated pulmonary artery systolic  pressure. The estimated right ventricular systolic pressure is 76.8 mmHg.  3. The mitral valve is normal in structure. Mild mitral valve  regurgitation. No evidence of mitral stenosis. Moderate mitral annular  calcification.  4. Tricuspid valve regurgitation is mild to moderate.  5. The aortic valve is tricuspid. Aortic valve regurgitation is not  visualized. Mild to moderate aortic valve sclerosis/calcification is  present, without any evidence of aortic stenosis.  6. The inferior vena cava is dilated in size with >50% respiratory  variability, suggesting right atrial pressure of 8 mmHg.   Colonoscopy Impression - Preparation of the colon was inadequate. - Perianal skin tags found on perianal exam. - Stool in the rectum, in the recto-sigmoid colon, in the sigmoid colon and in the descending colon. - Three 2 to 5 mm polyps in the cecum, removed with Suzzette Gasparro hot snare. Resected and retrieved. - Six 3 to 8 mm polyps in the ascending colon, removed with Mercie Balsley hot snare. Resected and retrieved. - Three 5 to 9 mm polyps in the transverse colon, removed with Aryeh Butterfield hot snare. Resected and Retrieved. Recommendation - Return patient to hospital ward for ongoing care. - Soft diet. - Continue present medications. - Await pathology results. - Repeat colonoscopy in 6 months because the bowel preparation was suboptimal. - Return to GI office in 2 months.  Impression - Z-line regular, 38 cm from the incisors. - Normal mucosa was found in the entire stomach. Biopsied. - Normal duodenal  bulb, first portion of the duodenum and second portion of the Duodenum. Recommendation - Return patient to hospital ward for ongoing care. - Clear liquid diet. - Continue present medications. - Perform Clemons Salvucci colonoscopy tomorrow.  Antimicrobials:  Anti-infectives (From admission, onward)   Start     Dose/Rate Route Frequency Ordered Stop   04/06/20 1430  piperacillin-tazobactam (ZOSYN) IVPB 2.25 g        2.25 g 100 mL/hr over 30 Minutes Intravenous Every 6 hours 04/06/20 1320 04/11/20 2359   04/05/20 1930  meropenem (MERREM) 1 g in sodium chloride 0.9 % 100 mL IVPB  Status:  Discontinued        1 g 200 mL/hr over 30 Minutes Intravenous Every 12 hours 04/05/20 1832 04/06/20 1320   04/04/20 1000  Ampicillin-Sulbactam (UNASYN) 3 g in sodium chloride 0.9 % 100 mL IVPB  Status:  Discontinued        3 g 200 mL/hr over 30 Minutes Intravenous Every 12 hours 04/04/20 0851 04/05/20 1829   03/30/20 1400  rifaximin (XIFAXAN) tablet 550 mg        550 mg Oral 2 times daily 03/30/20 1136       Subjective: C/o swallowing issues Abnormal sensation when swallowing  Objective: Vitals:   04/17/20 1300 04/17/20 2237 04/18/20 0645 04/18/20 1348  BP: (!) 181/112 133/63 (!) 152/72 (!) 134/98  Pulse: 97 75 78 74  Resp: 20  18 14   Temp: 98.9 F (37.2 C) 98.3 F (36.8 C) 98.4 F (36.9 C) 98.2 F (36.8 C)  TempSrc:  Oral Oral Oral  SpO2:  98% 97% 100%  Weight:   68.3 kg   Height:        Intake/Output Summary (Last 24 hours) at 04/18/2020 1718 Last data filed at 04/18/2020 0900 Gross per 24 hour  Intake 240 ml  Output 1000 ml  Net -760 ml   Filed Weights   04/15/20 0500 04/16/20 0644 04/18/20 0645  Weight: 68.1 kg 68.9 kg 68.3 kg    Examination:  General: No acute distress. Cardiovascular: Heart sounds show Ivylynn Hoppes regular rate, and rhythm. Lungs: Clear to auscultation bilaterally. Abdomen: Soft, nontender, nondistended Neurological: Alert and oriented 3. Moves all extremities 4 . Cranial  nerves II through XII grossly intact. Skin: Warm and dry. No rashes or lesions. Extremities: No clubbing or cyanosis. RUE edema.  Data Reviewed: I have personally reviewed following labs and imaging studies  CBC: Recent Labs  Lab 04/12/20 1130 04/12/20 1130 04/13/20 1031 04/13/20 1031 04/14/20 0931 04/15/20 0744 04/16/20 0450 04/17/20 0650 04/18/20 0509  WBC 15.8*  --  18.3*  --  16.0*  --   --  15.7* 15.6*  NEUTROABS  --   --   --   --  9.9*  --   --  10.5* 10.2*  HGB 9.1*   < > 9.3*   < > 8.4* 8.9* 8.2* 8.7* 8.2*  HCT 28.4*   < > 28.9*   < > 25.2* 27.5* 25.3* 26.1* 26.2*  MCV 76.8*  --  77.1*  --  76.6*  --   --  75.7* 79.2*  PLT 602*  --  689*  --  652*  --   --  697* 632*   < > = values in this interval not displayed.    Basic Metabolic Panel: Recent Labs  Lab 04/13/20 1031 04/15/20 0744 04/16/20 0450 04/17/20 0650 04/18/20 0509  NA 133* 135 136 138 137  137  K 3.1* 3.4* 3.5 3.4* 3.2*  3.2*  CL 105 106 107 105 109  109  CO2 19* 15* 17* 19* 18*  19*  GLUCOSE 123* 121* 108* 120* 106*  107*  BUN 46* 39* 40* 38* 33*  36*  CREATININE 4.82* 4.55* 4.17* 3.95* 3.60*  3.58*  CALCIUM 7.8* 8.2* 8.2* 8.3* 8.3*  8.3*  MG  --   --   --  1.8 1.7  PHOS 5.9* 6.2* 6.1* 6.3* 5.9*  6.0*    GFR: Estimated Creatinine Clearance: 12 mL/min (Jurney Overacker) (by C-G formula based on SCr of 3.58 mg/dL (H)).  Liver Function Tests: Recent Labs  Lab 04/13/20 1031 04/15/20 0744 04/16/20 0450 04/17/20 0650 04/18/20 0509  AST  --   --   --  45* 42*  ALT  --   --   --  16 15  ALKPHOS  --   --   --  188* 170*  BILITOT  --   --   --  0.6 0.5  PROT  --   --   --  6.8 6.1*  ALBUMIN 2.3* 2.6* 2.6* 2.6* 2.4*  2.4*    CBG: Recent Labs  Lab 04/17/20 1149 04/17/20 1643 04/17/20 2230 04/18/20 0734 04/18/20 1124  GLUCAP 111* 154* 119* 94 139*     Recent Results (from the past 240 hour(s))  Resp Panel by RT-PCR (Flu Khloe Hunkele&B, Covid) Nasopharyngeal Swab     Status: None   Collection  Time: 04/18/20 12:21 PM   Specimen: Nasopharyngeal Swab; Nasopharyngeal(NP) swabs in vial transport medium  Result Value Ref Range Status   SARS Coronavirus 2 by RT PCR NEGATIVE NEGATIVE Final    Comment: (NOTE) SARS-CoV-2 target nucleic acids are NOT DETECTED.  The SARS-CoV-2 RNA is generally detectable in upper respiratory specimens during the acute phase of infection. The lowest concentration of SARS-CoV-2 viral copies this assay can detect is 138 copies/mL. Jayren Cease negative result does not preclude SARS-Cov-2 infection and should not be used as the sole basis for treatment or other patient management decisions. Aaden Buckman negative result may occur with  improper specimen collection/handling, submission of specimen other than nasopharyngeal swab, presence of viral mutation(s) within the areas targeted by this assay, and inadequate number of viral copies(<138 copies/mL). Briana Farner negative result must be combined with clinical observations, patient history, and epidemiological information. The expected result is Negative.  Fact Sheet for Patients:  EntrepreneurPulse.com.au  Fact Sheet for Healthcare Providers:  IncredibleEmployment.be  This  test is no t yet approved or cleared by the [place] and  has been authorized for detection and/or diagnosis of SARS-CoV-2 by FDA under an Emergency Use Authorization (EUA). This EUA will remain  in effect (meaning this test can be used) for the duration of the COVID-19 declaration under Section 564(b)(1) of the Act, 21 U.S.C.section 360bbb-3(b)(1), unless the authorization is terminated  or revoked sooner.       Influenza Dinora Hemm by PCR NEGATIVE NEGATIVE Final   Influenza B by PCR NEGATIVE NEGATIVE Final    Comment: (NOTE) The Xpert Xpress SARS-CoV-2/FLU/RSV plus assay is intended as an aid in the diagnosis of influenza from Nasopharyngeal swab specimens and should not be used as Krizia Flight sole basis for treatment. Nasal washings  and aspirates are unacceptable for Xpert Xpress SARS-CoV-2/FLU/RSV testing.  Fact Sheet for Patients: EntrepreneurPulse.com.au  Fact Sheet for Healthcare Providers: IncredibleEmployment.be  This test is not yet approved or cleared by the [place] FDA and has been authorized for detection and/or diagnosis of SARS-CoV-2 by FDA under an Emergency Use Authorization (EUA). This EUA will remain in effect (meaning this test can be used) for the duration of the COVID-19 declaration under Section 564(b)(1) of the Act, 21 U.S.C. section 360bbb-3(b)(1), unless the authorization is terminated or revoked.  Performed at [facility], [place] [address] [place], [place] [address]          Radiology Studies: VAS [place] LOWER EXTREMITY VENOUS (DVT)  Result Date: 04/18/2020  Lower Venous DVT Study Indications: Edema.  Comparison Study: no prior Performing Technologist: Abram Sander RVS  Examination Guidelines: Argyle Gustafson complete evaluation includes B-mode imaging, spectral Doppler, color Doppler, and power Doppler as needed of all accessible portions of each vessel. Bilateral testing is considered an integral part of Urie Loughner complete examination. Limited examinations for reoccurring indications may be performed as noted. The reflux portion of the exam is performed with the patient in reverse Trendelenburg.  +---------+---------------+---------+-----------+----------+--------------+ RIGHT    CompressibilityPhasicitySpontaneityPropertiesThrombus Aging +---------+---------------+---------+-----------+----------+--------------+ CFV      Full           Yes      Yes                                 +---------+---------------+---------+-----------+----------+--------------+ SFJ      Full                                                        +---------+---------------+---------+-----------+----------+--------------+ FV Prox  Full                                                         +---------+---------------+---------+-----------+----------+--------------+ FV Mid   Full                                                        +---------+---------------+---------+-----------+----------+--------------+ FV DistalFull                                                        +---------+---------------+---------+-----------+----------+--------------+  PFV      Full                                                        +---------+---------------+---------+-----------+----------+--------------+ POP      Full           Yes      Yes                                 +---------+---------------+---------+-----------+----------+--------------+ PTV      Full                                                        +---------+---------------+---------+-----------+----------+--------------+ PERO     Full                                                        +---------+---------------+---------+-----------+----------+--------------+   +---------+---------------+---------+-----------+----------+-------------------+ LEFT     CompressibilityPhasicitySpontaneityPropertiesThrombus Aging      +---------+---------------+---------+-----------+----------+-------------------+ CFV      Full           Yes      Yes                                      +---------+---------------+---------+-----------+----------+-------------------+ SFJ      Full                                                             +---------+---------------+---------+-----------+----------+-------------------+ FV Prox  Full                                                             +---------+---------------+---------+-----------+----------+-------------------+ FV Mid   Full                                                             +---------+---------------+---------+-----------+----------+-------------------+ FV DistalFull                                                              +---------+---------------+---------+-----------+----------+-------------------+ PFV      Full                                                             +---------+---------------+---------+-----------+----------+-------------------+  POP      Full           Yes      Yes                                      +---------+---------------+---------+-----------+----------+-------------------+ PTV      Full                                                             +---------+---------------+---------+-----------+----------+-------------------+ PERO                                                  Not well visualized +---------+---------------+---------+-----------+----------+-------------------+     Summary: BILATERAL: - No evidence of deep vein thrombosis seen in the lower extremities, bilaterally. - No evidence of superficial venous thrombosis in the lower extremities, bilaterally. -No evidence of popliteal cyst, bilaterally.   *See table(s) above for measurements and observations. Electronically signed by Deitra Mayo MD on 04/18/2020 at 3:53:43 PM.    Final    DG ESOPHAGUS W SINGLE CM (SOL OR THIN BA)  Result Date: 04/18/2020 CLINICAL DATA:  Evaluate for esophageal motility post bedside swallow study by speech pathology. EXAM: ESOPHOGRAM/BARIUM SWALLOW TECHNIQUE: Single contrast examination was performed using  thin barium. FLUOROSCOPY TIME:  Fluoroscopy Time: 1 minutes and 24 seconds of low-dose pulsed fluoroscopy Radiation Exposure Index (if provided by the fluoroscopic device): 8.7 mGy Number of Acquired Spot Images: 0 COMPARISON:  Chest radiographs 04/03/2020.  Chest CT 12/25/2019. FINDINGS: The study was performed in the supine and semi erect positions. Study is extremely limited by positioning and the patient's inability to swallow significant quantities of barium. Only mild esophageal dysmotility is suggested. No mucosal  ulceration or high-grade stricture is identified. There was no aspiration of barium. The patient was able to swallow Nani Ingram barium tablet, and this passed into the distal esophagus. The tablet did not pass through the gastroesophageal junction during approximately 4 minutes of intermittent observation despite drinking water and thin barium in the semi erect position. IMPRESSION: 1. Limited study due to limited mobility and inability to swallow large volumes of barium. 2. Barium tablet did not pass through the distal esophagus. Mild distal esophageal stricture cannot be excluded. 3. No aspiration or significant dysmotility observed. Electronically Signed   By: Richardean Sale M.D.   On: 04/18/2020 16:25        Scheduled Meds: . apixaban  2.5 mg Oral BID  . diltiazem  120 mg Oral Daily  . ferrous sulfate  325 mg Oral Q breakfast  . furosemide  20 mg Oral BID  . gabapentin  300 mg Oral QHS  . guaiFENesin  1,200 mg Oral BID  . insulin aspart  0-9 Units Subcutaneous TID WC  . lactulose  10 g Oral BID  . levothyroxine  88 mcg Oral Q0600  . metoprolol tartrate  25 mg Oral BID  . pantoprazole  40 mg Oral Daily  . rifaximin  550 mg Oral BID  . sodium bicarbonate  1,300 mg Oral TID  . sodium chloride flush  3 mL  Intravenous Q12H   Continuous Infusions: . sodium chloride Stopped (04/01/20 0906)     LOS: 20 days    Time spent: over 5 min    Fayrene Helper, MD [facility]   To contact the attending provider between 7A-7P or the covering provider during after hours 7P-7A, please log into the web site [email or web address] and access using universal [place] password for that web site. If you do not have the password, please call the hospital operator.  04/18/2020, 5:18 PM

## 2020-04-18 NOTE — TOC Progression Note (Signed)
Transition of Care [facility]) - Progression Note    Patient Details  Name: Sarah Colon MRN: [id number] Date of Birth: [birth year]  [facility]) CM/SW Contact  Purcell Mouton, RN Phone Number: 04/18/2020, 3:25 PM  Clinical Narrative:    Pt will discharge in AM. [name] is aware that pt will not discharge today and will accept her in AM/Tony Admission Coordinator.    Expected Discharge Plan: [place] Barriers to Discharge: Continued Medical Work up  Expected Discharge Plan and Services Expected Discharge Plan: [place]   Discharge Planning Services: CM Consult Post Acute Care Choice: Resumption of Svcs/PTA Provider Living arrangements for the past 2 months: [place]                 DME Arranged: N/A         HH Arranged: NA           Social Determinants of Health (SDOH) Interventions    Readmission Risk Interventions Readmission Risk Prevention Plan 04/14/2020  Transportation Screening Complete  Medication Review Press photographer) Complete  PCP or Specialist appointment within 3-5 days of discharge Complete  HRI or Home Care Consult Complete  SW Recovery Care/Counseling Consult Complete  Palliative Care Screening Not [place] Complete  Some recent data might be hidden

## 2020-04-18 NOTE — Progress Notes (Signed)
Lower extremity venous has been completed.   Preliminary results in CV Proc.   Abram Sander 04/18/2020 9:05 AM

## 2020-04-19 ENCOUNTER — Inpatient Hospital Stay (HOSPITAL_COMMUNITY): Payer: Medicare Other

## 2020-04-19 DIAGNOSIS — D649 Anemia, unspecified: Secondary | ICD-10-CM | POA: Diagnosis not present

## 2020-04-19 LAB — CBC WITH DIFFERENTIAL/PLATELET
Abs Immature Granulocytes: 0.09 10*3/uL — ABNORMAL HIGH (ref 0.00–0.07)
Basophils Absolute: 0.2 10*3/uL — ABNORMAL HIGH (ref 0.0–0.1)
Basophils Relative: 1 %
Eosinophils Absolute: 0.6 10*3/uL — ABNORMAL HIGH (ref 0.0–0.5)
Eosinophils Relative: 4 %
HCT: 25.8 % — ABNORMAL LOW (ref 36.0–46.0)
Hemoglobin: 8.2 g/dL — ABNORMAL LOW (ref 12.0–15.0)
Immature Granulocytes: 1 %
Lymphocytes Relative: 18 %
Lymphs Abs: 2.9 10*3/uL (ref 0.7–4.0)
MCH: 24.6 pg — ABNORMAL LOW (ref 26.0–34.0)
MCHC: 31.8 g/dL (ref 30.0–36.0)
MCV: 77.5 fL — ABNORMAL LOW (ref 80.0–100.0)
Monocytes Absolute: 1.1 10*3/uL — ABNORMAL HIGH (ref 0.1–1.0)
Monocytes Relative: 6 %
Neutro Abs: 11.6 10*3/uL — ABNORMAL HIGH (ref 1.7–7.7)
Neutrophils Relative %: 70 %
Platelets: 614 10*3/uL — ABNORMAL HIGH (ref 150–400)
RBC: 3.33 MIL/uL — ABNORMAL LOW (ref 3.87–5.11)
RDW: 29.6 % — ABNORMAL HIGH (ref 11.5–15.5)
WBC: 16.4 10*3/uL — ABNORMAL HIGH (ref 4.0–10.5)
nRBC: 0 % (ref 0.0–0.2)

## 2020-04-19 LAB — COMPREHENSIVE METABOLIC PANEL
ALT: 15 U/L (ref 0–44)
AST: 45 U/L — ABNORMAL HIGH (ref 15–41)
Albumin: 2.6 g/dL — ABNORMAL LOW (ref 3.5–5.0)
Alkaline Phosphatase: 185 U/L — ABNORMAL HIGH (ref 38–126)
Anion gap: 10 (ref 5–15)
BUN: 35 mg/dL — ABNORMAL HIGH (ref 8–23)
CO2: 19 mmol/L — ABNORMAL LOW (ref 22–32)
Calcium: 8.1 mg/dL — ABNORMAL LOW (ref 8.9–10.3)
Chloride: 109 mmol/L (ref 98–111)
Creatinine, Ser: 3.33 mg/dL — ABNORMAL HIGH (ref 0.44–1.00)
GFR, Estimated: 14 mL/min — ABNORMAL LOW (ref >60)
Glucose, Bld: 160 mg/dL — ABNORMAL HIGH (ref 70–99)
Potassium: 3.1 mmol/L — ABNORMAL LOW (ref 3.5–5.1)
Sodium: 138 mmol/L (ref 135–145)
Total Bilirubin: 0.6 mg/dL (ref 0.3–1.2)
Total Protein: 6.4 g/dL — ABNORMAL LOW (ref 6.5–8.1)

## 2020-04-19 LAB — GLUCOSE, CAPILLARY
Glucose-Capillary: 108 mg/dL — ABNORMAL HIGH (ref 70–99)
Glucose-Capillary: 150 mg/dL — ABNORMAL HIGH (ref 70–99)
Glucose-Capillary: 116 mg/dL — ABNORMAL HIGH (ref 70–99)

## 2020-04-19 LAB — PHOSPHORUS: Phosphorus: 5.7 mg/dL — ABNORMAL HIGH (ref 2.5–4.6)

## 2020-04-19 LAB — MAGNESIUM: Magnesium: 1.8 mg/dL (ref 1.7–2.4)

## 2020-04-19 LAB — C-REACTIVE PROTEIN: CRP: 1.4 mg/dL — ABNORMAL HIGH

## 2020-04-19 LAB — SEDIMENTATION RATE: Sed Rate: 37 mm/h — ABNORMAL HIGH (ref 0–22)

## 2020-04-19 MED ORDER — RIFAXIMIN 550 MG PO TABS: 550.0000 mg | ORAL_TABLET | Freq: Two times a day (BID) | ORAL | 0 refills | Status: AC

## 2020-04-19 MED ORDER — APIXABAN 2.5 MG PO TABS: 2.5000 mg | ORAL_TABLET | Freq: Two times a day (BID) | ORAL | 0 refills | Status: AC

## 2020-04-19 MED ORDER — SODIUM BICARBONATE 650 MG PO TABS: 1300.0000 mg | ORAL_TABLET | Freq: Three times a day (TID) | ORAL | 0 refills | Status: AC

## 2020-04-19 MED ORDER — GABAPENTIN 300 MG PO CAPS: 300.0000 mg | ORAL_CAPSULE | Freq: Every day | ORAL | 0 refills | Status: AC

## 2020-04-19 MED ORDER — INSULIN ASPART 100 UNIT/ML ~~LOC~~ SOLN
0.0000 [IU] | Freq: Three times a day (TID) | SUBCUTANEOUS | 11 refills | Status: AC
Start: 2020-04-19 — End: ?

## 2020-04-19 MED ORDER — FERROUS SULFATE 325 (65 FE) MG PO TABS: 325.0000 mg | ORAL_TABLET | Freq: Every day | ORAL | 0 refills | Status: AC

## 2020-04-19 MED ORDER — POTASSIUM CHLORIDE CRYS ER 20 MEQ PO TBCR
20.0000 meq | EXTENDED_RELEASE_TABLET | Freq: Once | ORAL | Status: AC
  Administered 2020-04-20: 20 meq via ORAL
  Filled 2020-04-19: qty 1

## 2020-04-19 MED ORDER — LACTULOSE 10 GM/15ML PO SOLN: 10.0000 g | Freq: Two times a day (BID) | ORAL | 0 refills | Status: AC

## 2020-04-19 MED ORDER — DILTIAZEM HCL ER COATED BEADS 120 MG PO CP24: 120.0000 mg | ORAL_CAPSULE | Freq: Every day | ORAL | 0 refills | Status: AC

## 2020-04-19 MED ORDER — FUROSEMIDE 20 MG PO TABS: 20.0000 mg | ORAL_TABLET | Freq: Two times a day (BID) | ORAL | 0 refills | Status: AC
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
## 2020-04-19 NOTE — TOC Progression Note (Signed)
Transition of Care [facility]) - Progression Note    Patient Details  Name: Sarah Colon MRN: [id number] Date of Birth: 07-Nov-1945  Transition of Care [facility]) CM/SW [place], [place] Phone Number: 04/19/2020, 2:45 PM  Clinical Narrative:   Per MD, patient needs MRI and will not d/c today. TOC will continue to follow during the course of hospitalization.     Expected Discharge Plan: [place] Barriers to Discharge: Continued Medical Work up  Expected Discharge Plan and Services Expected Discharge Plan: [place]   Discharge Planning Services: CM Consult Post Acute Care Choice: Resumption of Svcs/PTA Provider Living arrangements for the past 2 months: [place] Expected Discharge Date: 04/19/20               DME Arranged: N/A         HH Arranged: NA           Social Determinants of Health (SDOH) Interventions    Readmission Risk Interventions Readmission Risk Prevention Plan 04/14/2020  Transportation Screening Complete  Medication Review Press photographer) Complete  PCP or Specialist appointment within 3-5 days of discharge Complete  HRI or Home Care Consult Complete  SW Recovery Care/Counseling Consult Complete  Palliative Care Screening Not [place] Complete  Some recent data might be hidden

## 2020-04-19 NOTE — TOC Progression Note (Addendum)
Transition of Care [facility]) - Progression Note    Patient Details  Name: Sarah Colon MRN: [id number] Date of Birth: Nov 03, 1945  Transition of Care [facility]) CM/SW [place], [place] Phone Number: 04/19/2020, 3:12 PM  Clinical Narrative:    Update: Pt's daughter returned CSW's call, requested to have MD call her to discuss change in pt's status. MD notified.   Per MD pt needs an MRI and will not be dc today, CSW called SNF and updated. CSW also reached out to pt's daughter to update, had to leave a vm.   Expected Discharge Plan: [place] Barriers to Discharge: Continued Medical Work up  Expected Discharge Plan and Services Expected Discharge Plan: [place]   Discharge Planning Services: CM Consult Post Acute Care Choice: Resumption of Svcs/PTA Provider Living arrangements for the past 2 months: [place] Expected Discharge Date: 04/19/20               DME Arranged: N/A         HH Arranged: NA           Social Determinants of Health (SDOH) Interventions    Readmission Risk Interventions Readmission Risk Prevention Plan 04/14/2020  Transportation Screening Complete  Medication Review Press photographer) Complete  PCP or Specialist appointment within 3-5 days of discharge Complete  HRI or Home Care Consult Complete  SW Recovery Care/Counseling Consult Complete  Palliative Care Screening Not [place] Complete  Some recent data might be hidden

## 2020-04-19 NOTE — Progress Notes (Signed)
PROGRESS NOTE    Sarah Colon  TMH:[id number] DOB: May 20, 1946 DOA: 03/19/2020 PCP: Patient, No Pcp Per   Chief Complaint  Patient presents with  . Cough  . Shortness of Breath   Brief Narrative: The patient is Sarah Colon 74 year old African-American female with [name] past medical history significant for but not limited to diabetes mellitus type 2, diastolic CHF, hypertension, paroxysmal atrial fibrillation, chronic kidney disease stage IV with Zian Delair baseline creatinine of 1.5-1.8 as well as other comorbidities who presented to the ED with shortness of breath, chest pain, fatigue over the last week. She also reported Sarah Colon poor appetite. She is found to have Sarah Colon hemoglobin of 3 on admission. There are no reports of blood in her stool or dark or tarry stools. Fecal occult test in the ED was positive. GI was consulted and she was admitted to the hospitalist service. She normally lives in SNF is nonambulatory and in the wheelchair. She underwent EGD and colonoscopy without evidence of any bleeding. She had multiple polyp removal from her colonoscopy today. Her prep was poor. GI recommending resuming Xarelto 04/03/20 and she received another dose of diuresis yesterday given her volume overload. Her Xarelto is now resumed today but this evening on 04/04/2019 when she spiked [name] temperature and she does have [name] leukocytosis which is chronic but we will panculture to rule out infection.  She is complaining of some abdominal pain so we will get Darcel Zick CT of the abdomen pelvis. Chest x-ray shows possible pneumonia so she was started on Unasyn and SLP was consulted given the location being the Right Lung Base.  **After further workup it appears the patient has Sarah Colon UTI with Providencia and Entercoccus Faecalis. We will escalate her antibiotics to IV meropenem and hold off of Vancomycin for now. Repeat CT scan showed persistent mild perirectal and presacral fat stranding of unclear etiology and presacral rectal findings  associate with cortical erosions, sclerosis and thinning of the S1 vertebral body. As well as the urinary bladder that was thickened circumferentially. Hemoglobin has dropped Sarah Colon little bit GI feels that the CT findings are in the setting of fecal impaction and due to stool inflammation from the fecal impaction. They feel that her anemia worsens and she will need [name] flex sig with enemas for prep prior to discharge to reevaluate the rectum after resolution of the fecal impaction. We will escalate antibiotics given her UTI.  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 Her renal function continues to worsen and we are doing further work-up will obtain [name] bladder scan, renal ultrasound and started the patient back on gentle IV fluids with normal saline at 25 mils per hour. We are to place [name] Foley catheter for strict I's and O's however patient refused this. We will continue monitor and will notify nephrology about her worsening renal function.  Nephrology feels that Contrast from last CT scan caused her AKI and GI has signed off and feel as if her Abdominal Pain is multifactorial. They are going to hold off on Flex Sigmoidoscopy.  Renal function is improving.  MRI of sacrum was obtained due to abnormal CT scan which shows findings concerning for discitis/osteomyelitis.    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)   Chronic diastolic heart failure (HCC)   Diarrhea   Renal insufficiency   Hypertension associated with diabetes ([name])  Paroxysmal atrial fibrillation (HCC)   Hypothyroidism   Symptomatic anemia   Abdominal pain   CHF (congestive heart failure) (HCC)   Acute metabolic encephalopathy   Other cirrhosis of liver ([name])   Palliative care by specialist   Goals of care, counseling/discussion  # Concern for Discitis-Osteomyelitis at  L4-5 CT abdomen/pelvis from 11/5 with preacral and rectal findings associated with cortical erosion, sclerosis, and thinning of the S1 vertebral body of unclear etiology -> sacral MRI obtained and notable for possible discitis/osteo She has pressure ulcer as noted below  Would consult ID on 11/21  Follow ESR/CRP, procal -> she's currently afebrile, has mild WBC count  5. Acute renal failure on chronic kidney disease stage IV  Volume overload  Acute on Chronic Diastolic HF Baseline creatinine approximately 2. Per renal, likely contrast related.  Creatinine peaked at 4.86, downtrending today Appreciate renal assistance - low dose lasix - see 11/18 note, recommended considering PO lasix 20 mg 1-2 x per dsay when creatinien <3 (she's already on PO lasix, will continue for now while inpatient as long as creatiine continues to improve) Weight up to 68.9 from 60.3 on admission Defer diuresis to renal in setting of AKI  1 symptomatic anemia, concern for GI bleed  Iron Def Anemia Hb on 03/17/2020 was 3.1 S/p 3 units pRBC  Hb relatively stable to 8.7 Labs c/w iron def anemia -> start iron supplementation, follow outpatient  On xarelto prior to admission  Upper endoscopy with normal mucosa in stomach (biopsied).  Normal duodenal bulb, first portion of duodenum and second portion of duodenum.  Colonoscopy with inadequate prep - perianal skin tags, three 2-5 mm polyps removed with hot snare, six 3-8 mm polyps removed with hot snare, three 5-9 mm polyps removed with hot snare Follow pathology -- tubular adenoma, no high grade dysplasia or carcinoma (11/3).  11/1, benign gastric mucosa, negative for H pylori. Needs repeat colonoscopy in 6 months due to poor prep  Patient currently on [name] soft diet.  Unable to resume Xarelto due to renal function - Eliquis started at 2.5 mg twice daily.  CT abdomen and pelvis was done which showed focal rectal wall thickening, acute cellular fluid as well as  circumferential thickened bladder. GI was reconsulted and patient given Lasasha Brophy soapsuds enema due to concerns for fecal impaction.. Per GI if continued drop in hemoglobin will need Lalaine Overstreet flex sigmoidoscopy. Currently holding off on flex sigmoidoscopy. GI has signed off. Repeat H&H in the morning. Follow.   # Early Satiety  Abdominal Discomfort Dysphagia: ? If related to volume overload.  S/p EGD as noted above.  Continue PPI, follow, continue to workup.  Postprandial discomfort be representative of biliary colic? (imaging with large peripherally calcified gallstone within gallbladder lumen) -> recommend outpatient surgery follow up.  SLP eval today recommending barium swallow to eval esophageal motility Barium swallow limited -> barium tablet did not pass through distal esophagus - per GI, given recent EGD without abnormality noted on esophagus, ok for outpatient follow up  2. Left lower quadrant abdominal pain  Fecal Impaction  Providencia stuartii and VRE UTI CT abdomen and pelvis 11/5 with persistent mild perirectal and presacral fat stranding of unclear etiology, suggestion of possible focal posterior rectal wall thickening that could represent Manish Ruggiero real lesion vs under distension of the rectal wall.   Patient noted to have had Modest Draeger fever with Kadeem Hyle leukocytosis. Fever likely secondary from UTI.  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 Status post full courseIV antibiotics.  Patient was seen in consultation by GI who felt patient had Shianne Zeiser component of fecal impaction causing her abdominal pain. Soapsuds enema was ordered early on during the hospitalization. Patient with complaints of diffuse abdominal pain and initially noted not to have had Yanelli Zapanta bowel movement in several days. Patient status post enema and having bowel movementswith clinical improvement with abdominal pain. Continue lactulose. Patient refusing MiraLAX and as such has been changed to as needed. Supportive care.   3.  Liver cirrhosis New diagnosis. MRI of abdomen was degraded exam however no concerning lesions noted repeat MRI recommended which can be done in the outpatient setting. Continue Xifaxan and lactulose. Outpatient follow-up with GI.  Follow acute hepatitis panel (equivocal hep Selina Tapper IgM, will repeat, unclear significance)  4. Acute metabolic encephalopathy Likely secondary to symptomatic profound anemia. Ammonia levels within normal limits. Clinical improvement and likely at baseline.  7. Metabolic acidosis Likely secondary to acute kidney injury.Was improving however trending back down. On bicarb tablets. Per nephrology.   8. Hyponatremia ?? SIADH. Patient on 1200 cc fluid restriction per nephrology. Hyponatremia improving. Resolved.  9. Hyperkalemia Resolved   11. Hypothyroidism Continue Synthroid.  12. Well-controlled diabetes mellitus type 2 Hemoglobin A1c 5.6.CBG 115 this morning. Sliding scale insulin.  13. Paroxysmal atrial fibrillation Continue Cardizem and Lopressor for rate control. Hemoglobin stable. Xarelto not resumed due to renal function. Eliquis 2.5 mg twice daily for anticoagulation. Outpatient follow-up.  14. Hypokalemia Replace and follow  15. Leukocytosis Likely reactive. Patient afebrile. Status post full course of antibiotic treatment for UTI. Patient with no respiratory symptoms. Leukocytosis trending down. No need for any further antibiotics at this time.  16. Right upper extremity edema Follow RUE [place] - negative for DVT  17. 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 Subcutaneous fat may be visible but bone, tendon or muscle are NOT exposed. (Active)  04/05/2020 1149  Location: Sacrum  Location Orientation: Medial  Staging: Stage III -  Full thickness tissue loss. Subcutaneous fat may be visible but bone, tendon or muscle are NOT exposed.  Wound Description  (Comments):   Present on Admission: Yes   GOC: appreciate palliative assistance, See 11/15 note  DVT prophylaxis: eliquis Code Status: DNR Family Communication: daughter Disposition:   Status is: Inpatient  Remains inpatient appropriate because:Inpatient level of care appropriate due to severity of illness   Dispo: The patient is from: Home              Anticipated d/c is to: SNF              Anticipated d/c date is: > 3 days              Patient currently is not medically stable to d/c.       Consultants:   Renal  GI   palliative  Procedures:  UE [place] Summary:    Right:  No evidence of deep vein thrombosis in the upper extremity. No evidence of  superficial vein thrombosis in the upper extremity.    Left:  No evidence of thrombosis in the subclavian.   Echo IMPRESSIONS    1. Left ventricular ejection fraction, by estimation, is 65 to 70%. The  left ventricle has normal function. The left ventricle has no regional  wall motion abnormalities. There is mild concentric left ventricular  hypertrophy. Left ventricular diastolic  parameters are consistent with Grade I diastolic dysfunction (impaired  relaxation). Elevated left ventricular end-diastolic pressure.  2. Right ventricular systolic function is normal. The right ventricular  size is normal. There is moderately elevated pulmonary artery systolic  pressure. The estimated right ventricular systolic pressure is 07.6 mmHg.  3. The mitral valve is normal in structure. Mild mitral valve  regurgitation. No evidence of mitral stenosis. Moderate mitral annular  calcification.  4. Tricuspid valve regurgitation is mild to moderate.  5. The aortic valve is tricuspid. Aortic valve regurgitation is not  visualized. Mild to moderate aortic valve sclerosis/calcification is  present, without any evidence of aortic stenosis.  6. The inferior vena cava is dilated in size with >50% respiratory  variability,  suggesting right atrial pressure of 8 mmHg.   Colonoscopy Impression - Preparation of the colon was inadequate. - Perianal skin tags found on perianal exam. - Stool in the rectum, in the recto-sigmoid colon, in the sigmoid colon and in the descending colon. - Three 2 to 5 mm polyps in the cecum, removed with Venessa Wickham hot snare. Resected and retrieved. - Six 3 to 8 mm polyps in the ascending colon, removed with Breta Demedeiros hot snare. Resected and retrieved. - Three 5 to 9 mm polyps in the transverse colon, removed with Lashica Hannay hot snare. Resected and Retrieved. Recommendation - Return patient to hospital ward for ongoing care. - Soft diet. - Continue present medications. - Await pathology results. - Repeat colonoscopy in 6 months because the bowel preparation was suboptimal. - Return to GI office in 2 months.  Impression - Z-line regular, 38 cm from the incisors. - Normal mucosa was found in the entire stomach. Biopsied. - Normal duodenal bulb, first portion of the duodenum and second portion of the Duodenum. Recommendation - Return patient to hospital ward for ongoing care. - Clear liquid diet. - Continue present medications. - Perform Caiden Monsivais colonoscopy tomorrow.  LE [place] Summary:  BILATERAL:  - No evidence of deep vein thrombosis seen in the lower extremities,  bilaterally.  - No evidence of superficial venous thrombosis in the lower extremities,  bilaterally.  -No evidence of popliteal cyst, bilaterally.   Antimicrobials: Anti-infectives (From admission, onward)   Start     Dose/Rate Route Frequency Ordered Stop   04/19/20 0000  rifaximin (XIFAXAN) 550 MG TABS tablet        550 mg Oral 2 times daily 04/19/20 1311 05/19/20 2359   04/06/20 1430  piperacillin-tazobactam (ZOSYN) IVPB 2.25 g        2.25 g 100 mL/hr over 30 Minutes Intravenous Every 6 hours 04/06/20 1320 04/11/20 2359   04/05/20 1930  meropenem (MERREM) 1 g in sodium chloride 0.9 % 100 mL IVPB  Status:  Discontinued        1  g 200 mL/hr over 30 Minutes Intravenous Every 12 hours 04/05/20 1832 04/06/20 1320   04/04/20 1000  Ampicillin-Sulbactam (UNASYN) 3 g in sodium chloride 0.9 % 100 mL IVPB  Status:  Discontinued        3 g 200 mL/hr over 30 Minutes Intravenous Every 12 hours 04/04/20 0851 04/05/20 1829   03/30/20 1400  rifaximin (XIFAXAN) tablet 550 mg        550 mg Oral 2 times daily 03/30/20 1136           Subjective: Feels ok today Swallowing still feels off, but feels ok overall  Objective: Vitals:   04/18/20 2205 04/19/20 0500 04/19/20 0500 04/19/20 1318  BP: (!) 135/54  135/60 (!) 156/71  Pulse: 70  76 68  Resp: 18  16   Temp: 97.7  F (36.5 C)  97.6 F (36.4 C) 98.1 F (36.7 C)  TempSrc: Oral  Oral Oral  SpO2: 100%  98%   Weight:  69.4 kg    Height:        Intake/Output Summary (Last 24 hours) at 04/19/2020 1907 Last data filed at 04/19/2020 1000 Gross per 24 hour  Intake 60 ml  Output 450 ml  Net -390 ml   Filed Weights   04/16/20 0644 04/18/20 0645 04/19/20 0500  Weight: 68.9 kg 68.3 kg 69.4 kg    Examination:  General exam: Appears calm and comfortable  Respiratory system: Clear to auscultation. Respiratory effort normal. Cardiovascular system: S1 & S2 heard, RRR.  Gastrointestinal system: Abdomen is nondistended, soft and nontender. Central nervous system: Alert and oriented. No focal neurological deficits. Extremities: bilateral LE edema Skin: decubitus ulcer seen Psychiatry: Judgement and insight appear normal. Mood & affect appropriate.     Data Reviewed: I have personally reviewed following labs and imaging studies  CBC: Recent Labs  Lab 04/13/20 1031 04/13/20 1031 04/14/20 0931 04/14/20 0931 04/15/20 0744 04/16/20 0450 04/17/20 0650 04/18/20 0509 04/19/20 1133  WBC 18.3*  --  16.0*  --   --   --  15.7* 15.6* 16.4*  NEUTROABS  --   --  9.9*  --   --   --  10.5* 10.2* 11.6*  HGB 9.3*   < > 8.4*   < > 8.9* 8.2* 8.7* 8.2* 8.2*  HCT 28.9*   < > 25.2*    < > 27.5* 25.3* 26.1* 26.2* 25.8*  MCV 77.1*  --  76.6*  --   --   --  75.7* 79.2* 77.5*  PLT 689*  --  652*  --   --   --  697* 632* 614*   < > = values in this interval not displayed.    Basic Metabolic Panel: Recent Labs  Lab 04/15/20 0744 04/16/20 0450 04/17/20 0650 04/18/20 0509 04/19/20 1133  NA 135 136 138 137  137 138  K 3.4* 3.5 3.4* 3.2*  3.2* 3.1*  CL 106 107 105 109  109 109  CO2 15* 17* 19* 18*  19* 19*  GLUCOSE 121* 108* 120* 106*  107* 160*  BUN 39* 40* 38* 33*  36* 35*  CREATININE 4.55* 4.17* 3.95* 3.60*  3.58* 3.33*  CALCIUM 8.2* 8.2* 8.3* 8.3*  8.3* 8.1*  MG  --   --  1.8 1.7 1.8  PHOS 6.2* 6.1* 6.3* 5.9*  6.0* 5.7*    GFR: Estimated Creatinine Clearance: 13 mL/min (Rowyn Spilde) (by C-G formula based on SCr of 3.33 mg/dL (H)).  Liver Function Tests: Recent Labs  Lab 04/15/20 0744 04/16/20 0450 04/17/20 0650 04/18/20 0509 04/19/20 1133  AST  --   --  45* 42* 45*  ALT  --   --  _0 ALKPHOS  --   --  188* 170* 185*  BILITOT  --   --  0.6 0.5 0.6  PROT  --   --  6.8 6.1* 6.4*  ALBUMIN 2.6* 2.6* 2.6* 2.4*  2.4* 2.6*    CBG: Recent Labs  Lab 04/18/20 0734 04/18/20 1124 04/18/20 2202 04/19/20 0833 04/19/20 1124  GLUCAP 94 139* 142* 108* 150*     Recent Results (from the past 240 hour(s))  Resp Panel by RT-PCR (Flu Nyzier Boivin&B, Covid) Nasopharyngeal Swab     Status: None   Collection Time: 04/18/20 12:21 PM   Specimen: Nasopharyngeal Swab; Nasopharyngeal(NP) swabs in vial transport  medium  Result Value Ref Range Status   SARS Coronavirus 2 by RT PCR NEGATIVE NEGATIVE Final    Comment: (NOTE) SARS-CoV-2 target nucleic acids are NOT DETECTED.  The SARS-CoV-2 RNA is generally detectable in upper respiratory specimens during the acute phase of infection. The lowest concentration of SARS-CoV-2 viral copies this assay can detect is 138 copies/mL. Ethelene Closser negative result does not preclude SARS-Cov-2 infection and should not be used as the sole basis  for treatment or other patient management decisions. Cylah Fannin negative result may occur with  improper specimen collection/handling, submission of specimen other than nasopharyngeal swab, presence of viral mutation(s) within the areas targeted by this assay, and inadequate number of viral copies(<138 copies/mL). Tess Potts negative result must be combined with clinical observations, patient history, and epidemiological information. The expected result is Negative.  Fact Sheet for Patients:  EntrepreneurPulse.com.au  Fact Sheet for Healthcare Providers:  IncredibleEmployment.be  This test is no t yet approved or cleared by the [place] FDA and  has been authorized for detection and/or diagnosis of SARS-CoV-2 by FDA under an Emergency Use Authorization (EUA). This EUA will remain  in effect (meaning this test can be used) for the duration of the COVID-19 declaration under Section 564(b)(1) of the Act, 21 U.S.C.section 360bbb-3(b)(1), unless the authorization is terminated  or revoked sooner.       Influenza Jolan Upchurch by PCR NEGATIVE NEGATIVE Final   Influenza B by PCR NEGATIVE NEGATIVE Final    Comment: (NOTE) The Xpert Xpress SARS-CoV-2/FLU/RSV plus assay is intended as an aid in the diagnosis of influenza from Nasopharyngeal swab specimens and should not be used as Rhylynn Perdomo sole basis for treatment. Nasal washings and aspirates are unacceptable for Xpert Xpress SARS-CoV-2/FLU/RSV testing.  Fact Sheet for Patients: EntrepreneurPulse.com.au  Fact Sheet for Healthcare Providers: IncredibleEmployment.be  This test is not yet approved or cleared by the [place] FDA and has been authorized for detection and/or diagnosis of SARS-CoV-2 by FDA under an Emergency Use Authorization (EUA). This EUA will remain in effect (meaning this test can be used) for the duration of the COVID-19 declaration under Section 564(b)(1) of the Act, 21  U.S.C. section 360bbb-3(b)(1), unless the authorization is terminated or revoked.  Performed at [facility], [place] [address] [place], [place] [address]          Radiology Studies: MR SACRUM SI JOINTS WO CONTRAST  Result Date: 04/19/2020 CLINICAL DATA:  Back pain.  Abnormal CT EXAM: MRI SACRUM WITHOUT CONTRAST TECHNIQUE: Multiplanar, multisequence MR imaging of the sacrum was performed. No intravenous contrast was administered. COMPARISON:  CT 04/04/2020 FINDINGS: Transitional lumbosacral anatomy with lumbarization of the S1 segment and Mischell Branford partially developed disc space at the S1-2 level. There is fluid signal within the L4-5 intervertebral disc with endplate irregularity involving the L4 and L5 vertebral bodies. There is bone marrow edema throughout the inferior aspect of the visualized L4 vertebral body and to Hamsa Laurich lesser extent within the superior aspect of the L5 vertebral body. There is anterior height loss of L5. Small amount of fluid or edema in the anterior epidural space at L5 (series 7, image 15). There is prevertebral soft tissue edema as well as edematous changes within the bilateral iliopsoas musculature, right greater than left. No organized psoas fluid collection. Small bilateral facet joint effusions at L4-5. The sacrum is intact without fracture or marrow signal abnormality. No evidence of sacroiliitis or SI joint septic arthritis. There is nonspecific presacral edema. Circumferential rectal wall thickening and edema within  the perirectal fat. Partially visualized ascites throughout the abdomen and pelvis, increased from prior CT. Small bilateral hip joint effusions, nonspecific. There is nonspecific intramuscular edema within the paraspinal and visualized bilateral gluteal musculature. IMPRESSION: 1. Findings suspicious for discitis-osteomyelitis at L4-5 with bone marrow edema within the inferior aspect of the visualized L4 vertebral body and to Maymie Brunke lesser extent  within the superior aspect of L5 vertebral body. Small amount of fluid or edema in the anterior epidural space at L5 may be reactive or potentially represent developing phlegmon. If further imaging evaluation is clinically indicated, Hildy Nicholl contrast-enhanced MRI of the lumbar spine could be performed. 2. Prevertebral soft tissue edema as well as edematous changes within the bilateral iliopsoas musculature, right greater than left. No organized psoas fluid collection. 3. Partially visualized ascites throughout the abdomen and pelvis, increased from prior CT. 4. Circumferential rectal wall thickening and edema within the perirectal fat compatible with proctitis. 5. Small bilateral hip joint effusions, nonspecific. 6. Diffuse intramuscular edema within the paraspinal and visualized bilateral gluteal musculature. These results will be called to the ordering clinician or representative by the Radiologist Assistant, and communication documented in the PACS or [employer]. Electronically Signed   By: Davina Poke D.O.   On: 04/19/2020 18:48   VAS [place] LOWER EXTREMITY VENOUS (DVT)  Result Date: 04/18/2020  Lower Venous DVT Study Indications: Edema.  Comparison Study: no prior Performing Technologist: Abram Sander RVS  Examination Guidelines: Adamari Frede complete evaluation includes B-mode imaging, spectral Doppler, color Doppler, and power Doppler as needed of all accessible portions of each vessel. Bilateral testing is considered an integral part of Reece Fehnel complete examination. Limited examinations for reoccurring indications may be performed as noted. The reflux portion of the exam is performed with the patient in reverse Trendelenburg.  +---------+---------------+---------+-----------+----------+--------------+ RIGHT    CompressibilityPhasicitySpontaneityPropertiesThrombus Aging +---------+---------------+---------+-----------+----------+--------------+ CFV      Full           Yes      Yes                                  +---------+---------------+---------+-----------+----------+--------------+ SFJ      Full                                                        +---------+---------------+---------+-----------+----------+--------------+ FV Prox  Full                                                        +---------+---------------+---------+-----------+----------+--------------+ FV Mid   Full                                                        +---------+---------------+---------+-----------+----------+--------------+ FV DistalFull                                                        +---------+---------------+---------+-----------+----------+--------------+  PFV      Full                                                        +---------+---------------+---------+-----------+----------+--------------+ POP      Full           Yes      Yes                                 +---------+---------------+---------+-----------+----------+--------------+ PTV      Full                                                        +---------+---------------+---------+-----------+----------+--------------+ PERO     Full                                                        +---------+---------------+---------+-----------+----------+--------------+   +---------+---------------+---------+-----------+----------+-------------------+ LEFT     CompressibilityPhasicitySpontaneityPropertiesThrombus Aging      +---------+---------------+---------+-----------+----------+-------------------+ CFV      Full           Yes      Yes                                      +---------+---------------+---------+-----------+----------+-------------------+ SFJ      Full                                                             +---------+---------------+---------+-----------+----------+-------------------+ FV Prox  Full                                                              +---------+---------------+---------+-----------+----------+-------------------+ FV Mid   Full                                                             +---------+---------------+---------+-----------+----------+-------------------+ FV DistalFull                                                             +---------+---------------+---------+-----------+----------+-------------------+ PFV      Full                                                             +---------+---------------+---------+-----------+----------+-------------------+  POP      Full           Yes      Yes                                      +---------+---------------+---------+-----------+----------+-------------------+ PTV      Full                                                             +---------+---------------+---------+-----------+----------+-------------------+ PERO                                                  Not well visualized +---------+---------------+---------+-----------+----------+-------------------+     Summary: BILATERAL: - No evidence of deep vein thrombosis seen in the lower extremities, bilaterally. - No evidence of superficial venous thrombosis in the lower extremities, bilaterally. -No evidence of popliteal cyst, bilaterally.   *See table(s) above for measurements and observations. Electronically signed by Deitra Mayo MD on 04/18/2020 at 3:53:43 PM.    Final    DG ESOPHAGUS W SINGLE CM (SOL OR THIN BA)  Result Date: 04/18/2020 CLINICAL DATA:  Evaluate for esophageal motility post bedside swallow study by speech pathology. EXAM: ESOPHOGRAM/BARIUM SWALLOW TECHNIQUE: Single contrast examination was performed using  thin barium. FLUOROSCOPY TIME:  Fluoroscopy Time: 1 minutes and 24 seconds of low-dose pulsed fluoroscopy Radiation Exposure Index (if provided by the fluoroscopic device): 8.7 mGy Number of Acquired Spot Images: 0 COMPARISON:  Chest radiographs  04/03/2020.  Chest CT 12/25/2019. FINDINGS: The study was performed in the supine and semi erect positions. Study is extremely limited by positioning and the patient's inability to swallow significant quantities of barium. Only mild esophageal dysmotility is suggested. No mucosal ulceration or high-grade stricture is identified. There was no aspiration of barium. The patient was able to swallow Rydell Wiegel barium tablet, and this passed into the distal esophagus. The tablet did not pass through the gastroesophageal junction during approximately 4 minutes of intermittent observation despite drinking water and thin barium in the semi erect position. IMPRESSION: 1. Limited study due to limited mobility and inability to swallow large volumes of barium. 2. Barium tablet did not pass through the distal esophagus. Mild distal esophageal stricture cannot be excluded. 3. No aspiration or significant dysmotility observed. Electronically Signed   By: Richardean Sale M.D.   On: 04/18/2020 16:25        Scheduled Meds: . apixaban  2.5 mg Oral BID  . diltiazem  120 mg Oral Daily  . ferrous sulfate  325 mg Oral Q breakfast  . furosemide  20 mg Oral BID  . gabapentin  300 mg Oral QHS  . guaiFENesin  1,200 mg Oral BID  . insulin aspart  0-9 Units Subcutaneous TID WC  . lactulose  10 g Oral BID  . levothyroxine  88 mcg Oral Q0600  . metoprolol tartrate  25 mg Oral BID  . pantoprazole  40 mg Oral Daily  . rifaximin  550 mg Oral BID  . sodium bicarbonate  1,300 mg Oral TID  . sodium chloride flush  3 mL  Intravenous Q12H   Continuous Infusions: . sodium chloride Stopped (04/01/20 0906)     LOS: 21 days    Time spent: over 9 min    Fayrene Helper, MD [facility]   To contact the attending provider between 7A-7P or the covering provider during after hours 7P-7A, please log into the web site [email or web address] and access using universal [place] password for that web site. If you do not have the password,  please call the hospital operator.  04/19/2020, 7:07 PM

## 2020-04-19 NOTE — TOC Transition Note (Signed)
Transition of Care [facility]) - CM/SW Discharge Note   Patient Details  Name: Sarah Colon MRN: [id number] Date of Birth: 1945/12/30  [facility] CM/SW Contact:  [address]ta [address], [place] Phone Number: 04/19/2020, 1:06 PM   Clinical Narrative:    Patient will DC to: [name] Anticipated DC date: 04/19/20 Family notified: [name] Transport by: Corey Harold   Per MD patient ready for DC to [name]. RN to call report prior to discharge [phone], ask for nurse for room 705. RN, patient, patient's family, and facility notified of DC. Discharge Summary and FL2 sent to facility. DC packet on chart. Ambulance transport requested for patient.   CSW will sign off for now as social work intervention is no longer needed. Please consult [place] again if new needs arise.       Barriers to Discharge: Continued Medical Work up   Patient Goals and CMS Choice Patient states their goals for this hospitalization and ongoing recovery are:: to go back to facility      Discharge Placement                       Discharge Plan and Services   Discharge Planning Services: CM Consult Post Acute Care Choice: Resumption of Svcs/PTA Provider          DME Arranged: N/A         HH Arranged: NA          Social Determinants of Health (SDOH) Interventions     Readmission Risk Interventions Readmission Risk Prevention Plan 04/14/2020  Transportation Screening Complete  Medication Review Press photographer) Complete  PCP or Specialist appointment within 3-5 days of discharge Complete  HRI or Home Care Consult Complete  SW Recovery Care/Counseling Consult Complete  Palliative Care Screening Not [place] Complete  Some recent data might be hidden

## 2020-04-20 DIAGNOSIS — I5032 Chronic diastolic (congestive) heart failure: Secondary | ICD-10-CM | POA: Diagnosis not present

## 2020-04-20 DIAGNOSIS — M4647 Discitis, unspecified, lumbosacral region: Secondary | ICD-10-CM | POA: Diagnosis not present

## 2020-04-20 DIAGNOSIS — M4646 Discitis, unspecified, lumbar region: Secondary | ICD-10-CM

## 2020-04-20 DIAGNOSIS — R197 Diarrhea, unspecified: Secondary | ICD-10-CM | POA: Diagnosis not present

## 2020-04-20 DIAGNOSIS — E1321 Other specified diabetes mellitus with diabetic nephropathy: Secondary | ICD-10-CM | POA: Diagnosis not present

## 2020-04-20 DIAGNOSIS — N179 Acute kidney failure, unspecified: Secondary | ICD-10-CM | POA: Diagnosis not present

## 2020-04-20 LAB — CBC WITH DIFFERENTIAL/PLATELET
Abs Immature Granulocytes: 0.1 10*3/uL — ABNORMAL HIGH (ref 0.00–0.07)
Basophils Absolute: 0.2 10*3/uL — ABNORMAL HIGH (ref 0.0–0.1)
Basophils Relative: 1 %
Eosinophils Absolute: 0.6 10*3/uL — ABNORMAL HIGH (ref 0.0–0.5)
Eosinophils Relative: 4 %
HCT: 27.5 % — ABNORMAL LOW (ref 36.0–46.0)
Hemoglobin: 8.9 g/dL — ABNORMAL LOW (ref 12.0–15.0)
Immature Granulocytes: 1 %
Lymphocytes Relative: 17 %
Lymphs Abs: 2.8 10*3/uL (ref 0.7–4.0)
MCH: 24.9 pg — ABNORMAL LOW (ref 26.0–34.0)
MCHC: 32.4 g/dL (ref 30.0–36.0)
MCV: 77 fL — ABNORMAL LOW (ref 80.0–100.0)
Monocytes Absolute: 1.2 10*3/uL — ABNORMAL HIGH (ref 0.1–1.0)
Monocytes Relative: 7 %
Neutro Abs: 11.8 10*3/uL — ABNORMAL HIGH (ref 1.7–7.7)
Neutrophils Relative %: 70 %
Platelets: 617 10*3/uL — ABNORMAL HIGH (ref 150–400)
RBC: 3.57 MIL/uL — ABNORMAL LOW (ref 3.87–5.11)
RDW: 29.9 % — ABNORMAL HIGH (ref 11.5–15.5)
WBC: 16.7 10*3/uL — ABNORMAL HIGH (ref 4.0–10.5)
nRBC: 0 % (ref 0.0–0.2)

## 2020-04-20 LAB — COMPREHENSIVE METABOLIC PANEL
ALT: 16 U/L (ref 0–44)
AST: 48 U/L — ABNORMAL HIGH (ref 15–41)
Albumin: 2.6 g/dL — ABNORMAL LOW (ref 3.5–5.0)
Alkaline Phosphatase: 187 U/L — ABNORMAL HIGH (ref 38–126)
Anion gap: 7 (ref 5–15)
BUN: 34 mg/dL — ABNORMAL HIGH (ref 8–23)
CO2: 23 mmol/L (ref 22–32)
Calcium: 8.2 mg/dL — ABNORMAL LOW (ref 8.9–10.3)
Chloride: 109 mmol/L (ref 98–111)
Creatinine, Ser: 2.9 mg/dL — ABNORMAL HIGH (ref 0.44–1.00)
GFR, Estimated: 16 mL/min — ABNORMAL LOW (ref >60)
Glucose, Bld: 154 mg/dL — ABNORMAL HIGH (ref 70–99)
Potassium: 3.3 mmol/L — ABNORMAL LOW (ref 3.5–5.1)
Sodium: 139 mmol/L (ref 135–145)
Total Bilirubin: 0.6 mg/dL (ref 0.3–1.2)
Total Protein: 6.7 g/dL (ref 6.5–8.1)

## 2020-04-20 LAB — C-REACTIVE PROTEIN: CRP: 1.5 mg/dL — ABNORMAL HIGH (ref <1.0)

## 2020-04-20 LAB — GLUCOSE, CAPILLARY
Glucose-Capillary: 136 mg/dL — ABNORMAL HIGH (ref 70–99)
Glucose-Capillary: 123 mg/dL — ABNORMAL HIGH (ref 70–99)

## 2020-04-20 LAB — MAGNESIUM: Magnesium: 1.4 mg/dL — ABNORMAL LOW (ref 1.7–2.4)

## 2020-04-20 LAB — PROCALCITONIN: Procalcitonin: 0.6 ng/mL

## 2020-04-20 LAB — PHOSPHORUS: Phosphorus: 5 mg/dL — ABNORMAL HIGH (ref 2.5–4.6)

## 2020-04-20 LAB — SEDIMENTATION RATE: Sed Rate: 50 mm/hr — ABNORMAL HIGH (ref 0–22)

## 2020-04-20 MED ORDER — MAGNESIUM SULFATE 2 GM/50ML IV SOLN
2.0000 g | Freq: Once | INTRAVENOUS | Status: AC
  Administered 2020-04-20: 2 g via INTRAVENOUS
  Filled 2020-04-20: qty 50

## 2020-04-20 MED ORDER — POTASSIUM CHLORIDE CRYS ER 20 MEQ PO TBCR
40.0000 meq | EXTENDED_RELEASE_TABLET | Freq: Once | ORAL | Status: AC
  Administered 2020-04-20: 40 meq via ORAL
  Filled 2020-04-20: qty 2

## 2020-04-20 NOTE — Plan of Care (Signed)
  Problem: Health Behavior/Discharge Planning: Goal: Ability to manage health-related needs will improve Outcome: Progressing   Problem: Clinical Measurements: Goal: Ability to maintain clinical measurements within normal limits will improve Outcome: Progressing Goal: Will remain free from infection Outcome: Progressing Goal: Diagnostic test results will improve Outcome: Progressing Goal: Cardiovascular complication will be avoided Outcome: Progressing   Problem: Activity: Goal: Risk for activity intolerance will decrease Outcome: Progressing   Problem: Nutrition: Goal: Adequate nutrition will be maintained Outcome: Progressing   Problem: Coping: Goal: Level of anxiety will decrease Outcome: Progressing   Problem: Elimination: Goal: Will not experience complications related to bowel motility Outcome: Progressing Goal: Will not experience complications related to urinary retention Outcome: Progressing   Problem: Safety: Goal: Ability to remain free from injury will improve Outcome: Progressing   Problem: Skin Integrity: Goal: Risk for impaired skin integrity will decrease Outcome: Progressing   Problem: Health Behavior/Discharge Planning: Goal: Ability to manage health-related needs will improve Outcome: Progressing   Problem: Clinical Measurements: Goal: Ability to maintain clinical measurements within normal limits will improve Outcome: Progressing Goal: Will remain free from infection Outcome: Progressing Goal: Diagnostic test results will improve Outcome: Progressing Goal: Cardiovascular complication will be avoided Outcome: Progressing   Problem: Activity: Goal: Risk for activity intolerance will decrease Outcome: Progressing   Problem: Nutrition: Goal: Adequate nutrition will be maintained Outcome: Progressing   Problem: Coping: Goal: Level of anxiety will decrease Outcome: Progressing   Problem: Elimination: Goal: Will not experience  complications related to bowel motility Outcome: Progressing Goal: Will not experience complications related to urinary retention Outcome: Progressing   Problem: Safety: Goal: Ability to remain free from injury will improve Outcome: Progressing   Problem: Skin Integrity: Goal: Risk for impaired skin integrity will decrease Outcome: Progressing

## 2020-04-20 NOTE — Progress Notes (Signed)
PROGRESS NOTE    Sarah Colon  VHQ:[id number] DOB: 01-Apr-1946 DOA: 03/30/2020 PCP: Patient, No Pcp Per   Chief Complaint  Patient presents with  . Cough  . Shortness of Breath   Brief Narrative: The patient is a 74 year old African-American female with a past medical history significant for but not limited to diabetes mellitus type 2, diastolic CHF, hypertension, paroxysmal atrial fibrillation, chronic kidney disease stage IV with a baseline creatinine of 1.5-1.8 as well as other comorbidities who presented to the ED with shortness of breath, chest pain, fatigue over the last week. She also reported a poor appetite. She is found to have a hemoglobin of 3 on admission. There are no reports of blood in her stool or dark or tarry stools. Fecal occult test in the ED was positive. GI was consulted and she was admitted to the hospitalist service. She normally lives in SNF is nonambulatory and in the wheelchair. She underwent EGD and colonoscopy without evidence of any bleeding. She had multiple polyp removal from her colonoscopy today. Her prep was poor. GI recommending resuming Xarelto 04/03/20 and she received another dose of diuresis yesterday given her volume overload. Her Xarelto is now resumed today but this evening on 04/04/2019 when she spiked a temperature and she does have a leukocytosis which is chronic but we will panculture to rule out infection.  She is complaining of some abdominal pain so we will get a CT of the abdomen pelvis. Chest x-ray shows possible pneumonia so she was started on Unasyn and SLP was consulted given the location being the Right Lung Base.  **After further workup it appears the patient has a UTI with Providencia and Entercoccus Faecalis. We will escalate her antibiotics to IV meropenem and hold off of Vancomycin for now. Repeat CT scan showed persistent mild perirectal and presacral fat stranding of unclear etiology and presacral rectal findings  associate with cortical erosions, sclerosis and thinning of the S1 vertebral body. As well as the urinary bladder that was thickened circumferentially. Hemoglobin has dropped a little bit GI feels that the CT findings are in the setting of fecal impaction and due to stool inflammation from the fecal impaction. They feel that her anemia worsens and she will need a flex sig with enemas for prep prior to discharge to reevaluate the rectum after resolution of the fecal impaction. We will escalate antibiotics given her UTI.  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 Her renal function continues to worsen and we are doing further work-up will obtain a bladder scan, renal ultrasound and started the patient back on gentle IV fluids with normal saline at 25 mils per hour. We are to place a Foley catheter for strict I's and O's however patient refused this. We will continue monitor and will notify nephrology about her worsening renal function.  Nephrology feels that Contrast from last CT scan caused her AKI and GI has signed off and feel as if her Abdominal Pain is multifactorial. They are going to hold off on Flex Sigmoidoscopy.  Renal function is improving.  MRI of sacrum was obtained due to abnormal CT scan which shows findings concerning for discitis/osteomyelitis.    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  No significant drainage as per nursing staff.  She is noted to be afebrile.  Have leukocytosis.  CRP is 1.4.  ESR is 37.  Will discuss with infectious disease.  Acute renal failure on chronic kidney disease stage IV   Volume overload  Acute on Chronic Diastolic HF Baseline creatinine approximately 2. Per renal, likely contrast related.  Creatinine peaked at 4.86.  Patient was seen by nephrology.  Placed on low-dose diuretics.  Renal function has been improving.  Monitor urine output.  Symptomatic anemia, concern for GI bleed  Iron Def Anemia Hb on 03/05/2020 was 3.1. S/p 3 units pRBC  Labs c/w iron def anemia -> start iron supplementation, follow outpatient  On xarelto prior to admission  Upper endoscopy with normal mucosa in stomach (biopsied).  Normal duodenal bulb, first portion of duodenum and second portion of duodenum.  Colonoscopy with inadequate prep - perianal skin tags, three 2-5 mm polyps removed with hot snare, six 3-8 mm polyps removed with hot snare, three 5-9 mm polyps removed with hot snare Follow pathology -- tubular adenoma, no high grade dysplasia or carcinoma (11/3).  11/1, benign gastric mucosa, negative for H pylori. Needs repeat colonoscopy in 6 months due to poor prep  Unable to resume Xarelto due to renal function - Eliquis started at 2.5 mg twice daily.  CT abdomen and pelvis was done which showed focal rectal wall thickening, acute cellular fluid as well as circumferential thickened bladder. GI was reconsulted and patient given a soapsuds enema due to concerns for fecal impaction.  Hemoglobin has been stable.  Gastroenterology signed off.  No evidence for overt bleeding.  Early Satiety  Abdominal Discomfort Dysphagia:  Susy Frizzle related to volume overload.  Patient underwent extensive GI work-up as mentioned above.  Patient continues to have pain in her abdomen especially after she eats.  There was some concern for biliary colic.  Imaging study showed large peripherally calcified gallstone within gallbladder lumen.  Outpatient follow-up with general surgery.  Underwent barium swallow as well which did not pass through the distal esophagus but the patient did undergo EGD during this  hospitalization which did not show any abnormality in the esophagus.  Outpatient follow-up.  Continue PPI.    Left lower quadrant abdominal pain  Fecal Impaction  Providencia stuartii and VRE UTI CT abdomen and pelvis 11/5 with persistent mild perirectal and presacral fat stranding of unclear etiology, suggestion of possible focal posterior rectal wall thickening that could represent a real lesion vs under distension of the rectal wall.   Patient noted to have had a fever with a leukocytosis. Fever likely secondary from UTI.  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 Status post full courseIV antibiotics.  Patient was seen in consultation by GI who felt patient had a component of fecal impaction causing her abdominal pain. Soapsuds enema was ordered early on during the hospitalization. Patient with complaints of diffuse abdominal pain and initially noted not to have had a bowel movement in several days. Patient status post enema and having bowel movementswith clinical improvement with abdominal pain. Continue lactulose.   Liver cirrhosis New diagnosis. MRI of abdomen was degraded exam however no concerning lesions noted repeat MRI recommended which can be done in the outpatient setting. Outpatient follow-up with GI.  Follow acute hepatitis panel (equivocal hep A IgM, will repeat, unclear significance)  Acute metabolic encephalopathy Likely secondary to symptomatic profound anemia. Ammonia levels within normal limits.  Clinical improvement and likely at baseline.  It looks like she was started on rifaximin for possible hepatic encephalopathy which is not the case.  We will discontinue.  Metabolic acidosis Likely secondary to acute kidney injury.On bicarb tablets. Per nephrology.   Hyponatremia Likely due to volume overload.  Improved.  Hyperkalemia Resolved   Hypothyroidism Continue Synthroid.  Well-controlled diabetes mellitus type 2 Hemoglobin  A1c 5.6.Sliding scale insulin.  Paroxysmal atrial fibrillation Continue Cardizem and Lopressor for rate control. Hemoglobin stable. Xarelto not resumed due to renal function. Eliquis 2.5 mg twice daily for anticoagulation. Outpatient follow-up.  Hypokalemia Recheck tomorrow  Leukocytosis Reason unclear.  Thought to be reactive.  Now she is noted to have possible discitis.    Right upper extremity edema Follow RUE [place] - negative for DVT  Sacral ulcer, POA Pressure Injury 04/28/2020 Sacrum Medial Stage III -  Full thickness tissue loss. Subcutaneous fat may be visible but bone, tendon or muscle are NOT exposed. (Active)  04/27/2020 1149  Location: Sacrum  Location Orientation: Medial  Staging: Stage III -  Full thickness tissue loss. Subcutaneous fat may be visible but bone, tendon or muscle are NOT exposed.  Wound Description (Comments):   Present on Admission: Yes   GOC appreciate palliative assistance, See 11/15 note  DVT prophylaxis: eliquis Code Status: DNR Family Communication: daughter Disposition: To skilled nursing facility when medically stable.  Status is: Inpatient  Remains inpatient appropriate because:Inpatient level of care appropriate due to severity of illness   Dispo: The patient is from: Home              Anticipated d/c is to: SNF              Anticipated d/c date is: > 3 days              Patient currently is not medically stable to d/c.       Consultants:   Renal  GI   Palliative  Infectious disease  Procedures:  UE [place] Summary:  Right:  No evidence of deep vein thrombosis in the upper extremity. No evidence of  superficial vein thrombosis in the upper extremity.  Left:  No evidence of thrombosis in the subclavian.   Echo IMPRESSIONS  1. Left ventricular ejection fraction, by estimation, is 65 to 70%. The  left ventricle has normal function. The left ventricle has no regional  wall motion abnormalities. There is mild  concentric left ventricular  hypertrophy. Left ventricular diastolic  parameters are consistent with Grade I diastolic dysfunction (impaired  relaxation). Elevated left ventricular end-diastolic pressure.  2. Right ventricular systolic function is normal. The right ventricular  size is normal. There is moderately elevated pulmonary artery systolic  pressure. The estimated right ventricular systolic pressure is 09.6 mmHg.  3. The mitral valve is normal in structure. Mild mitral valve  regurgitation. No evidence of mitral stenosis. Moderate mitral annular  calcification.  4. Tricuspid valve regurgitation is mild to moderate.  5. The aortic valve is tricuspid. Aortic valve regurgitation is not  visualized. Mild to moderate aortic valve sclerosis/calcification is  present, without any evidence of aortic stenosis.  6. The inferior vena cava is dilated in size with >50% respiratory  variability, suggesting right atrial pressure of 8 mmHg.   Colonoscopy Impression - Preparation of the colon was inadequate. - Perianal skin tags found on perianal exam. - Stool in the rectum, in the recto-sigmoid colon, in the sigmoid colon and in the descending colon. - Three 2 to  5 mm polyps in the cecum, removed with a hot snare. Resected and retrieved. - Six 3 to 8 mm polyps in the ascending colon, removed with a hot snare. Resected and retrieved. - Three 5 to 9 mm polyps in the transverse colon, removed with a hot snare. Resected and Retrieved. Recommendation - Return patient to hospital ward for ongoing care. - Soft diet. - Continue present medications. - Await pathology results. - Repeat colonoscopy in 6 months because the bowel preparation was suboptimal. - Return to GI office in 2 months.  EGD Impression - Z-line regular, 38 cm from the incisors. - Normal mucosa was found in the entire stomach. Biopsied. - Normal duodenal bulb, first portion of the duodenum and second portion of  the Duodenum. Recommendation - Return patient to hospital ward for ongoing care. - Clear liquid diet. - Continue present medications. - Perform a colonoscopy tomorrow.  LE [place] Summary:  BILATERAL:  - No evidence of deep vein thrombosis seen in the lower extremities,  bilaterally.  - No evidence of superficial venous thrombosis in the lower extremities,  bilaterally.  -No evidence of popliteal cyst, bilaterally.   Antimicrobials: Anti-infectives (From admission, onward)   Start     Dose/Rate Route Frequency Ordered Stop   04/19/20 0000  rifaximin (XIFAXAN) 550 MG TABS tablet        550 mg Oral 2 times daily 04/19/20 1311 05/19/20 2359   04/06/20 1430  piperacillin-tazobactam (ZOSYN) IVPB 2.25 g        2.25 g 100 mL/hr over 30 Minutes Intravenous Every 6 hours 04/06/20 1320 04/11/20 2359   04/05/20 1930  meropenem (MERREM) 1 g in sodium chloride 0.9 % 100 mL IVPB  Status:  Discontinued        1 g 200 mL/hr over 30 Minutes Intravenous Every 12 hours 04/05/20 1832 04/06/20 1320   04/04/20 1000  Ampicillin-Sulbactam (UNASYN) 3 g in sodium chloride 0.9 % 100 mL IVPB  Status:  Discontinued        3 g 200 mL/hr over 30 Minutes Intravenous Every 12 hours 04/04/20 0851 04/05/20 1829   03/30/20 1400  rifaximin (XIFAXAN) tablet 550 mg        550 mg Oral 2 times daily 03/30/20 1136           Subjective: Patient continues to complain of abdominal pain especially after she eats.  Denies any nausea vomiting.  Concerned about her lower back pain and infection in the spinal area.    Objective: Vitals:   04/19/20 1318 04/19/20 2031 04/20/20 0500 04/20/20 0555  BP: (!) 156/71 (!) 144/78  139/88  Pulse: 68 85  82  Resp:  18  16  Temp: 98.1 F (36.7 C) 97.7 F (36.5 C)  98.6 F (37 C)  TempSrc: Oral Oral  Oral  SpO2:  98%  96%  Weight:   69.5 kg   Height:        Intake/Output Summary (Last 24 hours) at 04/20/2020 1045 Last data filed at 04/20/2020 1014 Gross per 24 hour  Intake  --  Output 224 ml  Net -224 ml   Filed Weights   04/18/20 0645 04/19/20 0500 04/20/20 0500  Weight: 68.3 kg 69.4 kg 69.5 kg    Examination:  General appearance: Awake alert.  In no distress.  Ill-appearing Resp: Clear to auscultation bilaterally.  Normal effort Cardio: S1-S2 is normal regular.  No S3-S4.  No rubs murmurs or bruit GI: Abdomen is soft.  Mildly tender diffusely without any  rebound rigidity or guarding.  No masses organomegaly.  Bowel sounds present normal.   Extremities: Mild edema bilateral lower extremities.  Limited range of motion of both lower extremities which is chronic. Neurologic:  No focal neurological deficits.     Data Reviewed: I have personally reviewed following labs and imaging studies  CBC: Recent Labs  Lab 04/14/20 0931 04/14/20 0931 04/15/20 0744 04/16/20 0450 04/17/20 0650 04/18/20 0509 04/19/20 1133  WBC 16.0*  --   --   --  15.7* 15.6* 16.4*  NEUTROABS 9.9*  --   --   --  10.5* 10.2* 11.6*  HGB 8.4*   < > 8.9* 8.2* 8.7* 8.2* 8.2*  HCT 25.2*   < > 27.5* 25.3* 26.1* 26.2* 25.8*  MCV 76.6*  --   --   --  75.7* 79.2* 77.5*  PLT 652*  --   --   --  697* 632* 614*   < > = values in this interval not displayed.    Basic Metabolic Panel: Recent Labs  Lab 04/15/20 0744 04/16/20 0450 04/17/20 0650 04/18/20 0509 04/19/20 1133  NA 135 136 138 137  137 138  K 3.4* 3.5 3.4* 3.2*  3.2* 3.1*  CL 106 107 105 109  109 109  CO2 15* 17* 19* 18*  19* 19*  GLUCOSE 121* 108* 120* 106*  107* 160*  BUN 39* 40* 38* 33*  36* 35*  CREATININE 4.55* 4.17* 3.95* 3.60*  3.58* 3.33*  CALCIUM 8.2* 8.2* 8.3* 8.3*  8.3* 8.1*  MG  --   --  1.8 1.7 1.8  PHOS 6.2* 6.1* 6.3* 5.9*  6.0* 5.7*    GFR: Estimated Creatinine Clearance: 13 mL/min (A) (by C-G formula based on SCr of 3.33 mg/dL (H)).  Liver Function Tests: Recent Labs  Lab 04/15/20 0744 04/16/20 0450 04/17/20 0650 04/18/20 0509 04/19/20 1133  AST  --   --  45* 42* 45*  ALT  --   --   _0 ALKPHOS  --   --  188* 170* 185*  BILITOT  --   --  0.6 0.5 0.6  PROT  --   --  6.8 6.1* 6.4*  ALBUMIN 2.6* 2.6* 2.6* 2.4*  2.4* 2.6*    CBG: Recent Labs  Lab 04/18/20 1124 04/18/20 2202 04/19/20 0833 04/19/20 1124 04/19/20 2025  GLUCAP 139* 142* 108* 150* 116*     Recent Results (from the past 240 hour(s))  Resp Panel by RT-PCR (Flu A&B, Covid) Nasopharyngeal Swab     Status: None   Collection Time: 04/18/20 12:21 PM   Specimen: Nasopharyngeal Swab; Nasopharyngeal(NP) swabs in vial transport medium  Result Value Ref Range Status   SARS Coronavirus 2 by RT PCR NEGATIVE NEGATIVE Final    Comment: (NOTE) SARS-CoV-2 target nucleic acids are NOT DETECTED.  The SARS-CoV-2 RNA is generally detectable in upper respiratory specimens during the acute phase of infection. The lowest concentration of SARS-CoV-2 viral copies this assay can detect is 138 copies/mL. A negative result does not preclude SARS-Cov-2 infection and should not be used as the sole basis for treatment or other patient management decisions. A negative result may occur with  improper specimen collection/handling, submission of specimen other than nasopharyngeal swab, presence of viral mutation(s) within the areas targeted by this assay, and inadequate number of viral copies(<138 copies/mL). A negative result must be combined with clinical observations, patient history, and epidemiological information. The expected result is Negative.  Fact Sheet for Patients:  EntrepreneurPulse.com.au  Fact Sheet for Healthcare Providers:  IncredibleEmployment.be  This test is no t yet approved or cleared by the [place] FDA and  has been authorized for detection and/or diagnosis of SARS-CoV-2 by FDA under an Emergency Use Authorization (EUA). This EUA will remain  in effect (meaning this test can be used) for the duration of the COVID-19 declaration under Section 564(b)(1)  of the Act, 21 U.S.C.section 360bbb-3(b)(1), unless the authorization is terminated  or revoked sooner.       Influenza A by PCR NEGATIVE NEGATIVE Final   Influenza B by PCR NEGATIVE NEGATIVE Final    Comment: (NOTE) The Xpert Xpress SARS-CoV-2/FLU/RSV plus assay is intended as an aid in the diagnosis of influenza from Nasopharyngeal swab specimens and should not be used as a sole basis for treatment. Nasal washings and aspirates are unacceptable for Xpert Xpress SARS-CoV-2/FLU/RSV testing.  Fact Sheet for Patients: EntrepreneurPulse.com.au  Fact Sheet for Healthcare Providers: IncredibleEmployment.be  This test is not yet approved or cleared by the [place] FDA and has been authorized for detection and/or diagnosis of SARS-CoV-2 by FDA under an Emergency Use Authorization (EUA). This EUA will remain in effect (meaning this test can be used) for the duration of the COVID-19 declaration under Section 564(b)(1) of the Act, 21 U.S.C. section 360bbb-3(b)(1), unless the authorization is terminated or revoked.  Performed at [facility], [place] [address], [place], [place] [address]          Radiology Studies: MR SACRUM SI JOINTS WO CONTRAST  Result Date: 04/19/2020 CLINICAL DATA:  Back pain.  Abnormal CT EXAM: MRI SACRUM WITHOUT CONTRAST TECHNIQUE: Multiplanar, multisequence MR imaging of the sacrum was performed. No intravenous contrast was administered. COMPARISON:  CT 04/04/2020 FINDINGS: Transitional lumbosacral anatomy with lumbarization of the S1 segment and a partially developed disc space at the S1-2 level. There is fluid signal within the L4-5 intervertebral disc with endplate irregularity involving the L4 and L5 vertebral bodies. There is bone marrow edema throughout the inferior aspect of the visualized L4 vertebral body and to a lesser extent within the superior aspect of the L5 vertebral body. There is anterior  height loss of L5. Small amount of fluid or edema in the anterior epidural space at L5 (series 7, image 15). There is prevertebral soft tissue edema as well as edematous changes within the bilateral iliopsoas musculature, right greater than left. No organized psoas fluid collection. Small bilateral facet joint effusions at L4-5. The sacrum is intact without fracture or marrow signal abnormality. No evidence of sacroiliitis or SI joint septic arthritis. There is nonspecific presacral edema. Circumferential rectal wall thickening and edema within the perirectal fat. Partially visualized ascites throughout the abdomen and pelvis, increased from prior CT. Small bilateral hip joint effusions, nonspecific. There is nonspecific intramuscular edema within the paraspinal and visualized bilateral gluteal musculature. IMPRESSION: 1. 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 Small amount of fluid or edema in the anterior epidural space at L5 may be reactive or potentially represent developing phlegmon. If further imaging evaluation is clinically indicated, a contrast-enhanced MRI of the lumbar spine could be performed. 2. Prevertebral soft tissue edema as well as edematous changes within the bilateral iliopsoas musculature, right greater than left. No organized psoas fluid collection. 3. Partially visualized ascites throughout the abdomen and pelvis, increased from prior CT. 4. Circumferential rectal  wall thickening and edema within the perirectal fat compatible with proctitis. 5. Small bilateral hip joint effusions, nonspecific. 6. Diffuse intramuscular edema within the paraspinal and visualized bilateral gluteal musculature. These results will be called to the ordering clinician or representative by the Radiologist Assistant, and communication documented in  the PACS or [employer]. Electronically Signed   By: Davina Poke D.O.   On: 04/19/2020 18:48   DG ESOPHAGUS W SINGLE CM (SOL OR THIN BA)  Result Date: 04/18/2020 CLINICAL DATA:  Evaluate for esophageal motility post bedside swallow study by speech pathology. EXAM: ESOPHOGRAM/BARIUM SWALLOW TECHNIQUE: Single contrast examination was performed using  thin barium. FLUOROSCOPY TIME:  Fluoroscopy Time: 1 minutes and 24 seconds of low-dose pulsed fluoroscopy Radiation Exposure Index (if provided by the fluoroscopic device): 8.7 mGy Number of Acquired Spot Images: 0 COMPARISON:  Chest radiographs 04/03/2020.  Chest CT 12/25/2019. FINDINGS: The study was performed in the supine and semi erect positions. Study is extremely limited by positioning and the patient's inability to swallow significant quantities of barium. Only mild esophageal dysmotility is suggested. No mucosal ulceration or high-grade stricture is identified. There was no aspiration of barium. The patient was able to swallow a barium tablet, and this passed into the distal esophagus. The tablet did not pass through the gastroesophageal junction during approximately 4 minutes of intermittent observation despite drinking water and thin barium in the semi erect position. IMPRESSION: 1. Limited study due to limited mobility and inability to swallow large volumes of barium. 2. Barium tablet did not pass through the distal esophagus. Mild distal esophageal stricture cannot be excluded. 3. No aspiration or significant dysmotility observed. Electronically Signed   By: Richardean Sale M.D.   On: 04/18/2020 16:25        Scheduled Meds: . apixaban  2.5 mg Oral BID  . diltiazem  120 mg Oral Daily  . ferrous sulfate  325 mg Oral Q breakfast  . furosemide  20 mg Oral BID  . gabapentin  300 mg Oral QHS  . guaiFENesin  1,200 mg Oral BID  . insulin aspart  0-9 Units Subcutaneous TID WC  . lactulose  10 g Oral BID  . levothyroxine  88 mcg Oral  Q0600  . metoprolol tartrate  25 mg Oral BID  . pantoprazole  40 mg Oral Daily  . rifaximin  550 mg Oral BID  . sodium bicarbonate  1,300 mg Oral TID  . sodium chloride flush  3 mL Intravenous Q12H   Continuous Infusions: . sodium chloride Stopped (04/01/20 0906)     LOS: 22 days    Bonnielee Haff, MD [facility]   To contact the attending provider between 7A-7P or the covering provider during after hours 7P-7A, please log into the web site [email or web address] and access using universal [place] password for that web site. If you do not have the password, please call the hospital operator.  04/20/2020, 10:45 AM

## 2020-04-20 NOTE — Consult Note (Signed)
[facility] for Infectious Disease       Reason for Consult: discitis, osteomyelitis    Referring Physician: Dr. [place] Pink  Active Problems:   Type 2 diabetes mellitus with diabetic neuropathic arthropathy ([place])   Chronic diastolic heart failure (HCC)   Diarrhea   Renal insufficiency   Hypertension associated with diabetes ([place])   Paroxysmal atrial fibrillation (HCC)   Hypothyroidism   Symptomatic anemia   Abdominal pain   CHF (congestive heart failure) ([place])   Acute metabolic encephalopathy   Other cirrhosis of liver ([place])   Palliative care by specialist   Goals of care, counseling/discussion   . apixaban  2.5 mg Oral BID  . diltiazem  120 mg Oral Daily  . ferrous sulfate  325 mg Oral Q breakfast  . furosemide  20 mg Oral BID  . gabapentin  300 mg Oral QHS  . guaiFENesin  1,200 mg Oral BID  . insulin aspart  0-9 Units Subcutaneous TID WC  . lactulose  10 g Oral BID  . levothyroxine  88 mcg Oral Q0600  . metoprolol tartrate  25 mg Oral BID  . pantoprazole  40 mg Oral Daily  . potassium chloride  40 mEq Oral Once  . sodium bicarbonate  1,300 mg Oral TID  . sodium chloride flush  3 mL Intravenous Q12H    Recommendations: Aspiration of area of concern - I have ordered Will hold off on antibiotics pending culture  Assessment: Possible discitis based on imaging and location of ulcer. ESR, CRP not significantly elevated.    Antibiotics: Last antibiotics 11/12  HPI: Sarah Colon is a 74 y.o. female with DM, CHF, HTN, parasysmal af, who came in with a main complaint of SOB.  She has had some decline over the last several months and has been mainly bedbound and now with a decubitus ulcer. MRI of her sacrum was concerning for L4-5 discitis, osteomyelitis.  CRP 1.5, ESR 50.  She has cirrhosis noted on recent scans while admitted.  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  She has had multiple hospitalizations and ED visits over the last  6 months. She has had a persistent leukocytosis prior to this.  Review of Systems:  Constitutional: negative for fevers and chills Integument/breast: negative for rash Musculoskeletal: negative for myalgias and arthralgias All other systems reviewed and are negative    Past Medical History:  Diagnosis Date  . Abnormality of gait 10/11/2014  . Anemia   . Arthritis    "all over"  . CHF (congestive heart failure) ([place])   . Chronic kidney disease   . Chronic lower back pain   . Chronic pain of left knee   . Chronic pain of right knee   . DM type 2 with diabetic peripheral neuropathy ([place]) 10/24/2014  . Glaucoma   . Gout dx 04/09/2013   knee tapped by ortho with monosodium urate crystals  . Hypertension   . Rhabdomyolysis   . Type II diabetes mellitus ([place])   . Weakness of both legs 10/11/2014    Social History   Tobacco Use  . Smoking status: Former Smoker    Packs/day: 0.12    Years: 20.00    Pack years: 2.40    Types: Cigarettes  . Smokeless tobacco: Never Used  . Tobacco comment: "smoked cigarettes til I was ~ 33"  Vaping Use  . Vaping Use: Never used  Substance Use Topics  . Alcohol use: No  Comment: "quit drinking in ~ 1999"  . Drug use: No    Family History  Problem Relation Age of Onset  . Kidney failure Mother   . Lung disease Father   . Uterine cancer Sister     Allergies  Allergen Reactions  . Codeine Other (See Comments)    Swelling    Physical Exam: Constitutional: in no apparent distress  Vitals:   04/20/20 0555 04/20/20 1333  BP: 139/88 (!) 145/64  Pulse: 82 78  Resp: 16 20  Temp: 98.6 F (37 C) 98.5 F (36.9 C)  SpO2: 96% 98%   EYES: anicteric Cardiovascular: Cor RRR Respiratory: clear; Musculoskeletal: back area partially viewed and no pus, no surrounding erythema Skin: negatives: no rash  Lab Results  Component Value Date   WBC 16.7 (H) 04/20/2020   HGB 8.9 (L) 04/20/2020   HCT 27.5 (L) 04/20/2020   MCV 77.0 (L) 04/20/2020    PLT 617 (H) 04/20/2020    Lab Results  Component Value Date   CREATININE 2.90 (H) 04/20/2020   BUN 34 (H) 04/20/2020   NA 139 04/20/2020   K 3.3 (L) 04/20/2020   CL 109 04/20/2020   CO2 23 04/20/2020    Lab Results  Component Value Date   ALT 16 04/20/2020   AST 48 (H) 04/20/2020   ALKPHOS 187 (H) 04/20/2020     Microbiology: Recent Results (from the past 240 hour(s))  Resp Panel by RT-PCR (Flu A&B, Covid) Nasopharyngeal Swab     Status: None   Collection Time: 04/18/20 12:21 PM   Specimen: Nasopharyngeal Swab; Nasopharyngeal(NP) swabs in vial transport medium  Result Value Ref Range Status   SARS Coronavirus 2 by RT PCR NEGATIVE NEGATIVE Final    Comment: (NOTE) SARS-CoV-2 target nucleic acids are NOT DETECTED.  The SARS-CoV-2 RNA is generally detectable in upper respiratory specimens during the acute phase of infection. The lowest concentration of SARS-CoV-2 viral copies this assay can detect is 138 copies/mL. A negative result does not preclude SARS-Cov-2 infection and should not be used as the sole basis for treatment or other patient management decisions. A negative result may occur with  improper specimen collection/handling, submission of specimen other than nasopharyngeal swab, presence of viral mutation(s) within the areas targeted by this assay, and inadequate number of viral copies(<138 copies/mL). A negative result must be combined with clinical observations, patient history, and epidemiological information. The expected result is Negative.  Fact Sheet for Patients:  EntrepreneurPulse.com.au  Fact Sheet for Healthcare Providers:  IncredibleEmployment.be  This test is no t yet approved or cleared by the [place] FDA and  has been authorized for detection and/or diagnosis of SARS-CoV-2 by FDA under an Emergency Use Authorization (EUA). This EUA will remain  in effect (meaning this test can be used) for the duration  of the COVID-19 declaration under Section 564(b)(1) of the Act, 21 U.S.C.section 360bbb-3(b)(1), unless the authorization is terminated  or revoked sooner.       Influenza A by PCR NEGATIVE NEGATIVE Final   Influenza B by PCR NEGATIVE NEGATIVE Final    Comment: (NOTE) The Xpert Xpress SARS-CoV-2/FLU/RSV plus assay is intended as an aid in the diagnosis of influenza from Nasopharyngeal swab specimens and should not be used as a sole basis for treatment. Nasal washings and aspirates are unacceptable for Xpert Xpress SARS-CoV-2/FLU/RSV testing.  Fact Sheet for Patients: EntrepreneurPulse.com.au  Fact Sheet for Healthcare Providers: IncredibleEmployment.be  This test is not yet approved or cleared by the [place] FDA  and has been authorized for detection and/or diagnosis of SARS-CoV-2 by FDA under an Emergency Use Authorization (EUA). This EUA will remain in effect (meaning this test can be used) for the duration of the COVID-19 declaration under Section 564(b)(1) of the Act, 21 U.S.C. section 360bbb-3(b)(1), unless the authorization is terminated or revoked.  Performed at [facility], [place] [address] [place], [place] [address]     Dolphus Linch W Latoi Giraldo, MD [facility] for Infectious Disease [facility] [email or web address] 04/20/2020, 3:30 PM

## 2020-04-20 NOTE — Progress Notes (Signed)
[facility] ([facility]) Community Based Palliative Care       This patient has been referred to our palliative care services in the community.  [facility] will continue to follow for any discharge planning needs and to coordinate admission onto palliative care.   If you have questions or need assistance, please call [phone] or contact the hospital Liaison listed on AMION.     Thank you for the opportunity to participate in this patient's care.     Chrislyn King, BSN, RN [facility] Hospital Liaison   [phone] (24h on call) 

## 2020-04-21 DIAGNOSIS — L89149 Pressure ulcer of left lower back, unspecified stage: Secondary | ICD-10-CM

## 2020-04-21 DIAGNOSIS — G9341 Metabolic encephalopathy: Secondary | ICD-10-CM | POA: Diagnosis not present

## 2020-04-21 DIAGNOSIS — M4647 Discitis, unspecified, lumbosacral region: Secondary | ICD-10-CM

## 2020-04-21 DIAGNOSIS — Z7401 Bed confinement status: Secondary | ICD-10-CM

## 2020-04-21 DIAGNOSIS — L89139 Pressure ulcer of right lower back, unspecified stage: Secondary | ICD-10-CM | POA: Diagnosis not present

## 2020-04-21 DIAGNOSIS — M4640 Discitis, unspecified, site unspecified: Secondary | ICD-10-CM

## 2020-04-21 DIAGNOSIS — M4626 Osteomyelitis of vertebra, lumbar region: Secondary | ICD-10-CM

## 2020-04-21 DIAGNOSIS — N179 Acute kidney failure, unspecified: Secondary | ICD-10-CM | POA: Diagnosis not present

## 2020-04-21 DIAGNOSIS — I480 Paroxysmal atrial fibrillation: Secondary | ICD-10-CM | POA: Diagnosis not present

## 2020-04-21 DIAGNOSIS — R109 Unspecified abdominal pain: Secondary | ICD-10-CM | POA: Diagnosis not present

## 2020-04-21 LAB — COMPREHENSIVE METABOLIC PANEL
ALT: 15 U/L (ref 0–44)
AST: 42 U/L — ABNORMAL HIGH (ref 15–41)
Albumin: 2.5 g/dL — ABNORMAL LOW (ref 3.5–5.0)
Alkaline Phosphatase: 179 U/L — ABNORMAL HIGH (ref 38–126)
Anion gap: 11 (ref 5–15)
BUN: 35 mg/dL — ABNORMAL HIGH (ref 8–23)
CO2: 22 mmol/L (ref 22–32)
Calcium: 8.3 mg/dL — ABNORMAL LOW (ref 8.9–10.3)
Chloride: 109 mmol/L (ref 98–111)
Creatinine, Ser: 2.62 mg/dL — ABNORMAL HIGH (ref 0.44–1.00)
GFR, Estimated: 18 mL/min — ABNORMAL LOW (ref >60)
Glucose, Bld: 110 mg/dL — ABNORMAL HIGH (ref 70–99)
Potassium: 4 mmol/L (ref 3.5–5.1)
Sodium: 142 mmol/L (ref 135–145)
Total Bilirubin: 0.7 mg/dL (ref 0.3–1.2)
Total Protein: 6.4 g/dL — ABNORMAL LOW (ref 6.5–8.1)

## 2020-04-21 LAB — CBC
HCT: 25.8 % — ABNORMAL LOW (ref 36.0–46.0)
Hemoglobin: 8.3 g/dL — ABNORMAL LOW (ref 12.0–15.0)
MCH: 24.8 pg — ABNORMAL LOW (ref 26.0–34.0)
MCHC: 32.2 g/dL (ref 30.0–36.0)
MCV: 77 fL — ABNORMAL LOW (ref 80.0–100.0)
Platelets: 520 10*3/uL — ABNORMAL HIGH (ref 150–400)
RBC: 3.35 MIL/uL — ABNORMAL LOW (ref 3.87–5.11)
RDW: 29.7 % — ABNORMAL HIGH (ref 11.5–15.5)
WBC: 15.9 10*3/uL — ABNORMAL HIGH (ref 4.0–10.5)
nRBC: 0 % (ref 0.0–0.2)

## 2020-04-21 LAB — GLUCOSE, CAPILLARY
Glucose-Capillary: 131 mg/dL — ABNORMAL HIGH (ref 70–99)
Glucose-Capillary: 135 mg/dL — ABNORMAL HIGH (ref 70–99)
Glucose-Capillary: 113 mg/dL — ABNORMAL HIGH (ref 70–99)
Glucose-Capillary: 120 mg/dL — ABNORMAL HIGH (ref 70–99)
Glucose-Capillary: 118 mg/dL — ABNORMAL HIGH (ref 70–99)
Glucose-Capillary: 82 mg/dL (ref 70–99)
Glucose-Capillary: 139 mg/dL — ABNORMAL HIGH (ref 70–99)
Glucose-Capillary: 170 mg/dL — ABNORMAL HIGH (ref 70–99)
Glucose-Capillary: 115 mg/dL — ABNORMAL HIGH (ref 70–99)

## 2020-04-21 LAB — PROCALCITONIN: Procalcitonin: 0.61 ng/mL

## 2020-04-21 MED ORDER — APIXABAN 2.5 MG PO TABS
2.5000 mg | ORAL_TABLET | Freq: Two times a day (BID) | ORAL | Status: DC
  Administered 2020-04-24 – 2020-04-26 (×5): 2.5 mg via ORAL
  Filled 2020-04-21 (×4): qty 1

## 2020-04-21 MED ORDER — GLUCERNA SHAKE PO LIQD
237.0000 mL | Freq: Three times a day (TID) | ORAL | Status: DC
  Administered 2020-04-21 – 2020-04-25 (×6): 237 mL via ORAL
  Filled 2020-04-21 (×17): qty 237

## 2020-04-21 NOTE — Plan of Care (Signed)
  Problem: Health Behavior/Discharge Planning: Goal: Ability to manage health-related needs will improve Outcome: Progressing   Problem: Clinical Measurements: Goal: Ability to maintain clinical measurements within normal limits will improve Outcome: Progressing Goal: Will remain free from infection Outcome: Progressing Goal: Diagnostic test results will improve Outcome: Progressing Goal: Cardiovascular complication will be avoided Outcome: Progressing   Problem: Activity: Goal: Risk for activity intolerance will decrease Outcome: Progressing   Problem: Nutrition: Goal: Adequate nutrition will be maintained Outcome: Progressing   Problem: Coping: Goal: Level of anxiety will decrease Outcome: Progressing   Problem: Elimination: Goal: Will not experience complications related to bowel motility Outcome: Progressing Goal: Will not experience complications related to urinary retention Outcome: Progressing   Problem: Safety: Goal: Ability to remain free from injury will improve Outcome: Progressing   Problem: Skin Integrity: Goal: Risk for impaired skin integrity will decrease Outcome: Progressing

## 2020-04-21 NOTE — Progress Notes (Signed)
Subjective: No new complaints   Antibiotics:  Anti-infectives (From admission, onward)   Start     Dose/Rate Route Frequency Ordered Stop   04/19/20 0000  rifaximin (XIFAXAN) 550 MG TABS tablet        550 mg Oral 2 times daily 04/19/20 1311 05/19/20 2359   04/06/20 1430  piperacillin-tazobactam (ZOSYN) IVPB 2.25 g        2.25 g 100 mL/hr over 30 Minutes Intravenous Every 6 hours 04/06/20 1320 04/11/20 2359   04/05/20 1930  meropenem (MERREM) 1 g in sodium chloride 0.9 % 100 mL IVPB  Status:  Discontinued        1 g 200 mL/hr over 30 Minutes Intravenous Every 12 hours 04/05/20 1832 04/06/20 1320   04/04/20 1000  Ampicillin-Sulbactam (UNASYN) 3 g in sodium chloride 0.9 % 100 mL IVPB  Status:  Discontinued        3 g 200 mL/hr over 30 Minutes Intravenous Every 12 hours 04/04/20 0851 04/05/20 1829   03/30/20 1400  rifaximin (XIFAXAN) tablet 550 mg  Status:  Discontinued        550 mg Oral 2 times daily 03/30/20 1136 04/20/20 1122      Medications: Scheduled Meds: . [START ON 04/24/2020] apixaban  2.5 mg Oral BID  . diltiazem  120 mg Oral Daily  . feeding supplement (GLUCERNA SHAKE)  237 mL Oral TID BM  . ferrous sulfate  325 mg Oral Q breakfast  . furosemide  20 mg Oral BID  . gabapentin  300 mg Oral QHS  . guaiFENesin  1,200 mg Oral BID  . insulin aspart  0-9 Units Subcutaneous TID WC  . lactulose  10 g Oral BID  . levothyroxine  88 mcg Oral Q0600  . metoprolol tartrate  25 mg Oral BID  . pantoprazole  40 mg Oral Daily  . sodium bicarbonate  1,300 mg Oral TID  . sodium chloride flush  3 mL Intravenous Q12H   Continuous Infusions: . sodium chloride Stopped (04/01/20 0906)   PRN Meds:.sodium chloride, acetaminophen, levalbuterol, ondansetron (ZOFRAN) IV, polyethylene glycol, sodium chloride flush, traMADol    Objective: Weight change: 0.807 kg  Intake/Output Summary (Last 24 hours) at 04/21/2020 1506 Last data filed at 04/21/2020 1000 Gross per 24 hour    Intake 120 ml  Output 1050 ml  Net -930 ml   Blood pressure (!) 159/82, pulse (!) 103, temperature 98.3 F (36.8 C), temperature source Oral, resp. rate 16, height 5' 1.5" (1.562 m), weight 70.3 kg, SpO2 97 %. Temp:  [98.3 F (36.8 C)-98.7 F (37.1 C)] 98.3 F (36.8 C) (11/22 1428) Pulse Rate:  [71-103] 103 (11/22 1428) Resp:  [16-22] 16 (11/22 1428) BP: (130-159)/(59-82) 159/82 (11/22 1428) SpO2:  [92 %-97 %] 97 % (11/22 1428) Weight:  [70.3 kg] 70.3 kg (11/22 0500)  Physical Exam: General: Alert and awake,not in any acute distress. HEENT: anicteric sclera, EOMI CVS regular rate, normal no mgr Chest: , no wheezing, no respiratory distress, clear anteriorly Abdomen: soft non-distended, nontender Neuro: nonfocal  CBC:    BMET Recent Labs    04/20/20 1107 04/21/20 0437  NA 139 142  K 3.3* 4.0  CL 109 109  CO2 23 22  GLUCOSE 154* 110*  BUN 34* 35*  CREATININE 2.90* 2.62*  CALCIUM 8.2* 8.3*     Liver Panel  Recent Labs    04/20/20 1107 04/21/20 0437  PROT 6.7 6.4*  ALBUMIN 2.6* 2.5*  AST 48* 42*  ALT 16 15  ALKPHOS 187* 179*  BILITOT 0.6 0.7       Sedimentation Rate Recent Labs    04/20/20 1107  ESRSEDRATE 50*   C-Reactive Protein Recent Labs    04/19/20 1133 04/20/20 1107  CRP 1.4* 1.5*    Micro Results: Recent Results (from the past 720 hour(s))  Respiratory Panel by RT PCR (Flu A&B, Covid) - Nasopharyngeal Swab     Status: None   Collection Time: 03/10/2020  2:57 PM   Specimen: Nasopharyngeal Swab  Result Value Ref Range Status   SARS Coronavirus 2 by RT PCR NEGATIVE NEGATIVE Final    Comment: (NOTE) SARS-CoV-2 target nucleic acids are NOT DETECTED.  The SARS-CoV-2 RNA is generally detectable in upper respiratoy specimens during the acute phase of infection. The lowest concentration of SARS-CoV-2 viral copies this assay can detect is 131 copies/mL. A negative result does not preclude SARS-Cov-2 infection and should not be used as  the sole basis for treatment or other patient management decisions. A negative result may occur with  improper specimen collection/handling, submission of specimen other than nasopharyngeal swab, presence of viral mutation(s) within the areas targeted by this assay, and inadequate number of viral copies (<131 copies/mL). A negative result must be combined with clinical observations, patient history, and epidemiological information. The expected result is Negative.  Fact Sheet for Patients:  PinkCheek.be  Fact Sheet for Healthcare Providers:  GravelBags.it  This test is no t yet approved or cleared by the [place] FDA and  has been authorized for detection and/or diagnosis of SARS-CoV-2 by FDA under an Emergency Use Authorization (EUA). This EUA will remain  in effect (meaning this test can be used) for the duration of the COVID-19 declaration under Section 564(b)(1) of the Act, 21 U.S.C. section 360bbb-3(b)(1), unless the authorization is terminated or revoked sooner.     Influenza A by PCR NEGATIVE NEGATIVE Final   Influenza B by PCR NEGATIVE NEGATIVE Final    Comment: (NOTE) The Xpert Xpress SARS-CoV-2/FLU/RSV assay is intended as an aid in  the diagnosis of influenza from Nasopharyngeal swab specimens and  should not be used as a sole basis for treatment. Nasal washings and  aspirates are unacceptable for Xpert Xpress SARS-CoV-2/FLU/RSV  testing.  Fact Sheet for Patients: PinkCheek.be  Fact Sheet for Healthcare Providers: GravelBags.it  This test is not yet approved or cleared by the [place] FDA and  has been authorized for detection and/or diagnosis of SARS-CoV-2 by  FDA under an Emergency Use Authorization (EUA). This EUA will remain  in effect (meaning this test can be used) for the duration of the  Covid-19 declaration under Section 564(b)(1)  of the Act, 21  U.S.C. section 360bbb-3(b)(1), unless the authorization is  terminated or revoked. Performed at [facility], [place] [address] [place], [place] [address]   MRSA PCR Screening     Status: Abnormal   Collection Time: 04/25/2020  7:44 AM   Specimen: Nasopharyngeal  Result Value Ref Range Status   MRSA by PCR POSITIVE (A) NEGATIVE Final    Comment:        The GeneXpert MRSA Assay (FDA approved for NASAL specimens only), is one component of a comprehensive MRSA colonization surveillance program. It is not intended to diagnose MRSA infection nor to guide or monitor treatment for MRSA infections. RESULT CALLED TO, READ BACK BY AND VERIFIED WITH: DARK,D. RN @1111  ON 11.01.2021 BY COHEN,K Performed at [facility], [place] [address] [place],  [place] [address]   Culture, blood (Routine X 2) w Reflex to ID Panel     Status: None   Collection Time: 04/04/20  6:25 AM   Specimen: BLOOD  Result Value Ref Range Status   Specimen Description   Final    BLOOD RIGHT ANTECUBITAL Performed at [facility] [address], [place], [place] [address]    Special Requests   Final    BOTTLES DRAWN AEROBIC ONLY Blood Culture adequate volume Performed at [facility] [address], [place], [place] [address]    Culture   Final    NO GROWTH 5 DAYS Performed at [facility], [place] [address] [place], [place] [address]    Report Status 04/09/2020 FINAL  Final  Culture, blood (Routine X 2) w Reflex to ID Panel     Status: None   Collection Time: 04/04/20  6:25 AM   Specimen: BLOOD RIGHT HAND  Result Value Ref Range Status   Specimen Description   Final    BLOOD RIGHT HAND Performed at [facility] [address], [place], [place] [address]    Special Requests   Final    BOTTLES DRAWN AEROBIC AND ANAEROBIC Blood Culture adequate volume Performed at [facility] [address], [place], [place] [address]    Culture   Final    NO GROWTH 5 DAYS Performed at [facility], [place] [address] [place], [place] [address]    Report Status 04/09/2020 FINAL  Final  Culture, Urine     Status: Abnormal   Collection Time: 04/04/20  3:08 PM   Specimen: Urine, Random  Result Value Ref Range Status   Specimen Description   Final    URINE, RANDOM Performed at [facility] [address] [place], [place] [address]    Special Requests   Final    NONE Performed at [facility], [place] [address], [place],  [address]    Culture (A)  Final    70,000 COLONIES/mL PROVIDENCIA STUARTII >=100,000 COLONIES/mL VANCOMYCIN RESISTANT ENTEROCOCCUS    Report Status 04/06/2020 FINAL  Final   Organism ID, Bacteria PROVIDENCIA STUARTII (A)  Final   Organism ID, Bacteria VANCOMYCIN RESISTANT ENTEROCOCCUS (A)  Final      Susceptibility   Providencia stuartii - MIC*    AMPICILLIN >=32 RESISTANT Resistant     CEFAZOLIN >=64 RESISTANT Resistant     CEFEPIME 0.25 SENSITIVE Sensitive     CEFTRIAXONE >=64 RESISTANT Resistant     CIPROFLOXACIN >=4 RESISTANT Resistant     GENTAMICIN RESISTANT Resistant     IMIPENEM 2 SENSITIVE Sensitive     NITROFURANTOIN 128 RESISTANT Resistant     TRIMETH/SULFA 160 RESISTANT Resistant     AMPICILLIN/SULBACTAM >=32 RESISTANT Resistant     PIP/TAZO <=4 SENSITIVE Sensitive     * 70,000 COLONIES/mL PROVIDENCIA STUARTII   Vancomycin resistant enterococcus - MIC*    AMPICILLIN <=2 SENSITIVE Sensitive     NITROFURANTOIN <=16 SENSITIVE Sensitive     VANCOMYCIN >=32 RESISTANT Resistant     LINEZOLID 2 SENSITIVE Sensitive     * >=100,000 COLONIES/mL VANCOMYCIN RESISTANT ENTEROCOCCUS  Resp Panel by RT-PCR (Flu A&B, Covid) Nasopharyngeal Swab     Status: None   Collection Time: 04/18/20 12:21 PM   Specimen: Nasopharyngeal Swab; Nasopharyngeal(NP) swabs in vial transport medium  Result Value Ref Range  Status   SARS Coronavirus 2 by RT PCR NEGATIVE NEGATIVE Final  Comment: (NOTE) SARS-CoV-2 target nucleic acids are NOT DETECTED.  The SARS-CoV-2 RNA is generally detectable in upper respiratory specimens during the acute phase of infection. The lowest concentration of SARS-CoV-2 viral copies this assay can detect is 138 copies/mL. A negative result does not preclude SARS-Cov-2 infection and should not be used as the sole basis for treatment or other patient management decisions. A negative result may occur with  improper specimen collection/handling, submission of specimen other than nasopharyngeal swab, presence of viral mutation(s) within the areas targeted by this assay, and inadequate number of viral copies(<138 copies/mL). A negative result must be combined with clinical observations, patient history, and epidemiological information. The expected result is Negative.  Fact Sheet for Patients:  EntrepreneurPulse.com.au  Fact Sheet for Healthcare Providers:  IncredibleEmployment.be  This test is no t yet approved or cleared by the [place] FDA and  has been authorized for detection and/or diagnosis of SARS-CoV-2 by FDA under an Emergency Use Authorization (EUA). This EUA will remain  in effect (meaning this test can be used) for the duration of the COVID-19 declaration under Section 564(b)(1) of the Act, 21 U.S.C.section 360bbb-3(b)(1), unless the authorization is terminated  or revoked sooner.       Influenza A by PCR NEGATIVE NEGATIVE Final   Influenza B by PCR NEGATIVE NEGATIVE Final    Comment: (NOTE) The Xpert Xpress SARS-CoV-2/FLU/RSV plus assay is intended as an aid in the diagnosis of influenza from Nasopharyngeal swab specimens and should not be used as a sole basis for treatment. Nasal washings and aspirates are unacceptable for Xpert Xpress SARS-CoV-2/FLU/RSV testing.  Fact Sheet for  Patients: EntrepreneurPulse.com.au  Fact Sheet for Healthcare Providers: IncredibleEmployment.be  This test is not yet approved or cleared by the [place] FDA and has been authorized for detection and/or diagnosis of SARS-CoV-2 by FDA under an Emergency Use Authorization (EUA). This EUA will remain in effect (meaning this test can be used) for the duration of the COVID-19 declaration under Section 564(b)(1) of the Act, 21 U.S.C. section 360bbb-3(b)(1), unless the authorization is terminated or revoked.  Performed at [facility], [place] [address] [place], [place] [address]     Studies/Results: MR SACRUM SI JOINTS WO CONTRAST  Result Date: 04/19/2020 CLINICAL DATA:  Back pain.  Abnormal CT EXAM: MRI SACRUM WITHOUT CONTRAST TECHNIQUE: Multiplanar, multisequence MR imaging of the sacrum was performed. No intravenous contrast was administered. COMPARISON:  CT 04/04/2020 FINDINGS: Transitional lumbosacral anatomy with lumbarization of the S1 segment and a partially developed disc space at the S1-2 level. There is fluid signal within the L4-5 intervertebral disc with endplate irregularity involving the L4 and L5 vertebral bodies. There is bone marrow edema throughout the inferior aspect of the visualized L4 vertebral body and to a lesser extent within the superior aspect of the L5 vertebral body. There is anterior height loss of L5. Small amount of fluid or edema in the anterior epidural space at L5 (series 7, image 15). There is prevertebral soft tissue edema as well as edematous changes within the bilateral iliopsoas musculature, right greater than left. No organized psoas fluid collection. Small bilateral facet joint effusions at L4-5. The sacrum is intact without fracture or marrow signal abnormality. No evidence of sacroiliitis or SI joint septic arthritis. There is nonspecific presacral edema. Circumferential rectal wall thickening and  edema within the perirectal fat. Partially visualized ascites throughout the abdomen and pelvis, increased from prior CT. Small bilateral hip joint effusions, nonspecific. There is nonspecific intramuscular edema within  the paraspinal and visualized bilateral gluteal musculature. IMPRESSION: 1. 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 Small amount of fluid or edema in the anterior epidural space at L5 may be reactive or potentially represent developing phlegmon. If further imaging evaluation is clinically indicated, a contrast-enhanced MRI of the lumbar spine could be performed. 2. Prevertebral soft tissue edema as well as edematous changes within the bilateral iliopsoas musculature, right greater than left. No organized psoas fluid collection. 3. Partially visualized ascites throughout the abdomen and pelvis, increased from prior CT. 4. Circumferential rectal wall thickening and edema within the perirectal fat compatible with proctitis. 5. Small bilateral hip joint effusions, nonspecific. 6. Diffuse intramuscular edema within the paraspinal and visualized bilateral gluteal musculature. These results will be called to the ordering clinician or representative by the Radiologist Assistant, and communication documented in the PACS or [employer]. Electronically Signed   By: Davina Poke D.O.   On: 04/19/2020 18:48      Assessment/Plan:  INTERVAL HISTORY: pt for IR guided biopsy tomorrow   Active Problems:   Type 2 diabetes mellitus with diabetic neuropathic arthropathy (HCC)   Chronic diastolic heart failure (HCC)   Diarrhea   Renal insufficiency   Hypertension associated with diabetes (HCC)   Paroxysmal atrial fibrillation (HCC)   Hypothyroidism   Symptomatic anemia   Abdominal pain   CHF (congestive heart failure)  ([place])   Acute metabolic encephalopathy   Other cirrhosis of liver ([place])   Palliative care by specialist   Goals of care, counseling/discussion   Discitis of lumbosacral region    CORBIN HOTT is a 74 y.o. 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   Antibiotics currently being held  IR to biopsy disk space tomorrow for cultures     LOS: 23 days   Alcide Evener 04/21/2020, 3:06 PM

## 2020-04-21 NOTE — Progress Notes (Signed)
PROGRESS NOTE    Sarah Colon  BFX:[id number] DOB: 1946/03/26 DOA: 03/17/2020 PCP: Patient, No Pcp Per   Chief Complaint  Patient presents with   Cough   Shortness of Breath   Brief Narrative: The patient is a 74 year old African-American female with a past medical history significant for but not limited to diabetes mellitus type 2, diastolic CHF, hypertension, paroxysmal atrial fibrillation, chronic kidney disease stage IV with a baseline creatinine of 1.5-1.8 as well as other comorbidities who presented to the ED with shortness of breath, chest pain, fatigue over the last week. She also reported a poor appetite. She is found to have a hemoglobin of 3 on admission. There are no reports of blood in her stool or dark or tarry stools. Fecal occult test in the ED was positive. GI was consulted and she was admitted to the hospitalist service. She normally lives in SNF is nonambulatory and in the wheelchair. She underwent EGD and colonoscopy without evidence of any bleeding. She had multiple polyp removal from her colonoscopy today. Her prep was poor. GI recommending resuming Xarelto 04/03/20 and she received another dose of diuresis yesterday given her volume overload. Her Xarelto is now resumed today but this evening on 04/04/2019 when she spiked a temperature and she does have a leukocytosis which is chronic but we will panculture to rule out infection.  She is complaining of some abdominal pain so we will get a CT of the abdomen pelvis. Chest x-ray shows possible pneumonia so she was started on Unasyn and SLP was consulted given the location being the Right Lung Base.  Patient was found to have a UTI with Providencia and Entercoccus Faecalis.  She completed course of IV antibiotics.    Repeat CT scan showed persistent mild perirectal and presacral fat stranding of unclear etiology and presacral rectal findings associate with cortical erosions, sclerosis and thinning of the S1  vertebral body. As well as the urinary bladder that was thickened circumferentially.   GI feels that the CT findings are in the setting of fecal impaction and due to stool inflammation from the fecal impaction. They feel that her anemia worsens and she will need a flex sig with enemas for prep prior to discharge to reevaluate the rectum after resolution of the fecal impaction.   She continued to have worsening renal function. Nephrology feels that Contrast from last CT scan caused her AKI and GI has signed off and feel as if her Abdominal Pain is multifactorial. They are going to hold off on Flex Sigmoidoscopy.  Renal function is improving.  MRI of sacrum was obtained due to abnormal CT scan which shows findings concerning for discitis/osteomyelitis.  Infectious disease was consulted.  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  No significant drainage as per nursing staff.   Patient however continues to have low back pain.  CRP was 1.4.  ESR 37.  Infectious disease consulted who has recommended a CT-guided aspiration of that area.  Holding off on antibiotics for now.    Acute renal failure on chronic kidney disease stage IV   Volume overload   Acute on Chronic Diastolic HF Baseline creatinine approximately 2.  Acute renal failure most likely related to contrast nephropathy. Creatinine peaked at 4.86.  Patient was seen by nephrology.  Placed on low-dose diuretics.   Renal function continues to improve.  Creatinine is down to 2.62.  Monitor urine output.    Symptomatic anemia, concern for GI bleed   Iron Def Anemia Hb on 03/08/2020 was 3.1. S/p 3 units pRBC  Labs c/w iron def anemia -> start iron  supplementation, follow outpatient  On xarelto prior to admission  Upper endoscopy with normal mucosa in stomach (biopsied).  Normal duodenal bulb, first portion of duodenum and second portion of duodenum.  Colonoscopy with inadequate prep - perianal skin tags, three 2-5 mm polyps removed with hot snare, six 3-8 mm polyps removed with hot snare, three 5-9 mm polyps removed with hot snare Follow pathology -- tubular adenoma, no high grade dysplasia or carcinoma (11/3).  11/1, benign gastric mucosa, negative for H pylori. Needs repeat colonoscopy in 6 months due to poor prep  Unable to resume Xarelto due to renal function - Eliquis started at 2.5 mg twice daily.  CT abdomen and pelvis was done which showed focal rectal wall thickening, acute cellular fluid as well as circumferential thickened bladder. GI was reconsulted and patient given a soapsuds enema due to concerns for fecal impaction.  Gastroenterology signed off.  No evidence for overt bleeding.  Hemoglobin has been stable.  Early Satiety/Abdominal Discomfort Dysphagia/fecal impaction Could be related to volume overload.  Patient underwent extensive GI work-up as mentioned above.  Patient continues to have pain in her abdomen especially after she eats.  There was some concern for biliary colic.  Imaging study showed large peripherally calcified gallstone within gallbladder lumen.  Outpatient follow-up with general surgery.  Underwent barium swallow as well which did not pass through the distal esophagus but the patient did undergo EGD during this hospitalization which did not show any abnormality in the esophagus.   Patient was seen in consultation by GI who felt patient had a component of fecal impaction causing her abdominal pain. Soapsuds enema was ordered early on during the hospitalization. Patient with complaints of diffuse abdominal pain and initially noted not to have had a bowel movement in several days. Patient status post enema and  having bowel movementswith clinical improvement with abdominal pain. Continue lactulose.  Abdominal discomfort thought to be multifactorial, biliary colic, constipation.  No further work-up recommended by gastroenterology.  Continue PPI.  Outpatient follow-up with gastroenterology.  Providencia stuartii and VRE UTI Patient noted to have had a fever with a leukocytosis. Fever likely secondary from UTI.  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 Status post full courseIV antibiotics.   Liver cirrhosis New diagnosis. MRI of abdomen was degraded exam however no concerning lesions noted repeat MRI recommended which can be done in the outpatient setting. Outpatient follow-up with GI.  Acute hepatitis panel showed equivocal results for hepatitis A antibody.  Will need to be repeated in the outpatient setting.    Acute metabolic encephalopathy Likely secondary to symptomatic profound anemia. Ammonia levels within normal limits. Clinical improvement and likely at baseline.  It looks like she was started on rifaximin for possible hepatic encephalopathy which was not the case.  Rifaximin discontinued.  Continue lactulose for constipation.  Metabolic acidosis Likely secondary to acute kidney injury. Remains on oral sodium bicarbonate.  Stable.  Hyponatremia Likely due to volume overload.  Improved.  Potassium abnormalities Was initially hyperkalemic which improved.  Then became hypokalemic.  Was supplemented yesterday.  Noted to be better today.  Hypothyroidism Continue Synthroid.  Well-controlled diabetes mellitus type  2 Hemoglobin A1c 5.6.Sliding scale insulin.  CBGs are reasonably well controlled.  Low levels this morning likely due to poor oral intake.  Paroxysmal atrial fibrillation Continue Cardizem and Lopressor for rate control. Hemoglobin stable. Xarelto not resumed due to renal function. Eliquis 2.5 mg twice daily for anticoagulation. Outpatient  follow-up.  Leukocytosis Reason unclear.  Thought to be reactive.  Now she is noted to have possible discitis.    Right upper extremity edema Follow RUE [place] - negative for DVT  Sacral ulcer, POA Pressure Injury 04/19/2020 Sacrum Medial Stage III -  Full thickness tissue loss. Subcutaneous fat may be visible but bone, tendon or muscle are NOT exposed. (Active)  04/27/2020 1149  Location: Sacrum  Location Orientation: Medial  Staging: Stage III -  Full thickness tissue loss. Subcutaneous fat may be visible but bone, tendon or muscle are NOT exposed.  Wound Description (Comments):   Present on Admission: Yes   GOC appreciate palliative assistance, See 11/15 note  DVT prophylaxis: eliquis Code Status: DNR Family Communication: daughter Disposition: To skilled nursing facility when medically stable.  Status is: Inpatient  Remains inpatient appropriate because:Inpatient level of care appropriate due to severity of illness   Dispo: The patient is from: Home              Anticipated d/c is to: SNF              Anticipated d/c date is: > 3 days              Patient currently is not medically stable to d/c.       Consultants:   Nephrology  [facility]   Palliative  Infectious disease  Procedures:  UE [place] Summary:  Right:  No evidence of deep vein thrombosis in the upper extremity. No evidence of  superficial vein thrombosis in the upper extremity.  Left:  No evidence of thrombosis in the subclavian.   Echo IMPRESSIONS  1. Left ventricular ejection fraction, by estimation, is 65 to 70%. The  left ventricle has normal function. The left ventricle has no regional  wall motion abnormalities. There is mild concentric left ventricular  hypertrophy. Left ventricular diastolic  parameters are consistent with Grade I diastolic dysfunction (impaired  relaxation). Elevated left ventricular end-diastolic pressure.  2. Right ventricular systolic function is normal. The right  ventricular  size is normal. There is moderately elevated pulmonary artery systolic  pressure. The estimated right ventricular systolic pressure is 16.3 mmHg.  3. The mitral valve is normal in structure. Mild mitral valve  regurgitation. No evidence of mitral stenosis. Moderate mitral annular  calcification.  4. Tricuspid valve regurgitation is mild to moderate.  5. The aortic valve is tricuspid. Aortic valve regurgitation is not  visualized. Mild to moderate aortic valve sclerosis/calcification is  present, without any evidence of aortic stenosis.  6. The inferior vena cava is dilated in size with >50% respiratory  variability, suggesting right atrial pressure of 8 mmHg.   Colonoscopy Impression - Preparation of the colon was inadequate. - Perianal skin tags found on perianal exam. - Stool in the rectum, in the recto-sigmoid colon, in the sigmoid colon and in the descending colon. - Three 2 to 5 mm polyps in the cecum, removed with a hot snare. Resected and retrieved. - Six 3 to 8 mm polyps in the ascending colon, removed with a hot snare. Resected and retrieved. - Three 5 to 9 mm polyps in the transverse colon, removed with a hot snare. Resected  and Retrieved. Recommendation - Return patient to hospital ward for ongoing care. - Soft diet. - Continue present medications. - Await pathology results. - Repeat colonoscopy in 6 months because the bowel preparation was suboptimal. - Return to GI office in 2 months.  EGD Impression - Z-line regular, 38 cm from the incisors. - Normal mucosa was found in the entire stomach. Biopsied. - Normal duodenal bulb, first portion of the duodenum and second portion of the Duodenum. Recommendation - Return patient to hospital ward for ongoing care. - Clear liquid diet. - Continue present medications. - Perform a colonoscopy tomorrow.  LE [place] Summary:  BILATERAL:  - No evidence of deep vein thrombosis seen in the lower extremities,    bilaterally.  - No evidence of superficial venous thrombosis in the lower extremities,  bilaterally.  -No evidence of popliteal cyst, bilaterally.   Antimicrobials: Anti-infectives (From admission, onward)   Start     Dose/Rate Route Frequency Ordered Stop   04/19/20 0000  rifaximin (XIFAXAN) 550 MG TABS tablet        550 mg Oral 2 times daily 04/19/20 1311 05/19/20 2359   04/06/20 1430  piperacillin-tazobactam (ZOSYN) IVPB 2.25 g        2.25 g 100 mL/hr over 30 Minutes Intravenous Every 6 hours 04/06/20 1320 04/11/20 2359   04/05/20 1930  meropenem (MERREM) 1 g in sodium chloride 0.9 % 100 mL IVPB  Status:  Discontinued        1 g 200 mL/hr over 30 Minutes Intravenous Every 12 hours 04/05/20 1832 04/06/20 1320   04/04/20 1000  Ampicillin-Sulbactam (UNASYN) 3 g in sodium chloride 0.9 % 100 mL IVPB  Status:  Discontinued        3 g 200 mL/hr over 30 Minutes Intravenous Every 12 hours 04/04/20 0851 04/05/20 1829   03/30/20 1400  rifaximin (XIFAXAN) tablet 550 mg  Status:  Discontinued        550 mg Oral 2 times daily 03/30/20 1136 04/20/20 1122         Subjective: Patient continues to feel poorly.  Continues to have some abdominal discomfort.  Also has back pain.  Denies any nausea vomiting.  Poor appetite is reported.     Objective: Vitals:   04/20/20 1333 04/20/20 2022 04/21/20 0500 04/21/20 0509  BP: (!) 145/64 (!) 152/81  (!) 130/59  Pulse: 78 71  73  Resp: 20 (!) 22  (!) 21  Temp: 98.5 F (36.9 C) 98.7 F (37.1 C)  98.7 F (37.1 C)  TempSrc: Oral Oral  Oral  SpO2: 98% 96%  92%  Weight:   70.3 kg   Height:        Intake/Output Summary (Last 24 hours) at 04/21/2020 0912 Last data filed at 04/21/2020 0556 Gross per 24 hour  Intake 120 ml  Output 1274 ml  Net -1154 ml   Filed Weights   04/19/20 0500 04/20/20 0500 04/21/20 0500  Weight: 69.4 kg 69.5 kg 70.3 kg    Examination:  General appearance: Awake alert.  In no distress.  Ill-appearing Resp: Clear to  auscultation bilaterally.  Normal effort Cardio: S1-S2 is normal regular.  No S3-S4.  No rubs murmurs or bruit GI: Abdomen is soft.  Diffusely tender without any rebound rigidity or guarding.  No masses organomegaly.  Bowel sounds present normal.   Extremities: Mild edema bilateral lower extremities.  Limited range of motion which is chronic. Neurologic: No focal neurological deficits.     Data Reviewed: I have personally  reviewed following labs and imaging studies  CBC: Recent Labs  Lab 04/14/20 0931 04/15/20 0744 04/17/20 0650 04/18/20 0509 04/19/20 1133 04/20/20 1107 04/21/20 0437  WBC 16.0*  --  15.7* 15.6* 16.4* 16.7* 15.9*  NEUTROABS 9.9*  --  10.5* 10.2* 11.6* 11.8*  --   HGB 8.4*   < > 8.7* 8.2* 8.2* 8.9* 8.3*  HCT 25.2*   < > 26.1* 26.2* 25.8* 27.5* 25.8*  MCV 76.6*  --  75.7* 79.2* 77.5* 77.0* 77.0*  PLT 652*  --  697* 632* 614* 617* 520*   < > = values in this interval not displayed.    Basic Metabolic Panel: Recent Labs  Lab 04/16/20 0450 04/16/20 0450 04/17/20 0650 04/18/20 0509 04/19/20 1133 04/20/20 1107 04/21/20 0437  NA 136   < > 138 137   137 138 139 142  K 3.5   < > 3.4* 3.2*   3.2* 3.1* 3.3* 4.0  CL 107   < > 105 109   109 109 109 109  CO2 17*   < > 19* 18*   19* 19* 23 22  GLUCOSE 108*   < > 120* 106*   107* 160* 154* 110*  BUN 40*   < > 38* 33*   36* 35* 34* 35*  CREATININE 4.17*   < > 3.95* 3.60*   3.58* 3.33* 2.90* 2.62*  CALCIUM 8.2*   < > 8.3* 8.3*   8.3* 8.1* 8.2* 8.3*  MG  --   --  1.8 1.7 1.8 1.4*  --   PHOS 6.1*  --  6.3* 5.9*   6.0* 5.7* 5.0*  --    < > = values in this interval not displayed.    GFR: Estimated Creatinine Clearance: 16.6 mL/min (A) (by C-G formula based on SCr of 2.62 mg/dL (H)).  Liver Function Tests: Recent Labs  Lab 04/17/20 0650 04/18/20 0509 04/19/20 1133 04/20/20 1107 04/21/20 0437  AST 45* 42* 45* 48* 42*  ALT '16 15 15 16 15  ' ALKPHOS 188* 170* 185* 187* 179*  BILITOT 0.6 0.5 0.6 0.6 0.7  PROT 6.8  6.1* 6.4* 6.7 6.4*  ALBUMIN 2.6* 2.4*   2.4* 2.6* 2.6* 2.5*    CBG: Recent Labs  Lab 04/20/20 1207 04/20/20 1630 04/20/20 2119 04/20/20 2128 04/21/20 0754  GLUCAP 136* 120* 118* 123* 82     Recent Results (from the past 240 hour(s))  Resp Panel by RT-PCR (Flu A&B, Covid) Nasopharyngeal Swab     Status: None   Collection Time: 04/18/20 12:21 PM   Specimen: Nasopharyngeal Swab; Nasopharyngeal(NP) swabs in vial transport medium  Result Value Ref Range Status   SARS Coronavirus 2 by RT PCR NEGATIVE NEGATIVE Final    Comment: (NOTE) SARS-CoV-2 target nucleic acids are NOT DETECTED.  The SARS-CoV-2 RNA is generally detectable in upper respiratory specimens during the acute phase of infection. The lowest concentration of SARS-CoV-2 viral copies this assay can detect is 138 copies/mL. A negative result does not preclude SARS-Cov-2 infection and should not be used as the sole basis for treatment or other patient management decisions. A negative result may occur with  improper specimen collection/handling, submission of specimen other than nasopharyngeal swab, presence of viral mutation(s) within the areas targeted by this assay, and inadequate number of viral copies(<138 copies/mL). A negative result must be combined with clinical observations, patient history, and epidemiological information. The expected result is Negative.  Fact Sheet for Patients:  EntrepreneurPulse.com.au  Fact Sheet for Healthcare Providers:  IncredibleEmployment.be  This test is no t yet approved or cleared by the [place] and  has been authorized for detection and/or diagnosis of SARS-CoV-2 by FDA under an Emergency Use Authorization (EUA). This EUA will remain  in effect (meaning this test can be used) for the duration of the COVID-19 declaration under Section 564(b)(1) of the Act, 21 U.S.C.section 360bbb-3(b)(1), unless the authorization is terminated  or  revoked sooner.       Influenza A by PCR NEGATIVE NEGATIVE Final   Influenza B by PCR NEGATIVE NEGATIVE Final    Comment: (NOTE) The Xpert Xpress SARS-CoV-2/FLU/RSV plus assay is intended as an aid in the diagnosis of influenza from Nasopharyngeal swab specimens and should not be used as a sole basis for treatment. Nasal washings and aspirates are unacceptable for Xpert Xpress SARS-CoV-2/FLU/RSV testing.  Fact Sheet for Patients: EntrepreneurPulse.com.au  Fact Sheet for Healthcare Providers: IncredibleEmployment.be  This test is not yet approved or cleared by the [place] FDA and has been authorized for detection and/or diagnosis of SARS-CoV-2 by FDA under an Emergency Use Authorization (EUA). This EUA will remain in effect (meaning this test can be used) for the duration of the COVID-19 declaration under Section 564(b)(1) of the Act, 21 U.S.C. section 360bbb-3(b)(1), unless the authorization is terminated or revoked.  Performed at [facility], [place] [address], [place], [place] [address]          Radiology Studies: MR SACRUM SI JOINTS WO CONTRAST  Result Date: 04/19/2020 CLINICAL DATA:  Back pain.  Abnormal CT EXAM: MRI SACRUM WITHOUT CONTRAST TECHNIQUE: Multiplanar, multisequence MR imaging of the sacrum was performed. No intravenous contrast was administered. COMPARISON:  CT 04/04/2020 FINDINGS: Transitional lumbosacral anatomy with lumbarization of the S1 segment and a partially developed disc space at the S1-2 level. There is fluid signal within the L4-5 intervertebral disc with endplate irregularity involving the L4 and L5 vertebral bodies. There is bone marrow edema throughout the inferior aspect of the visualized L4 vertebral body and to a lesser extent within the superior aspect of the L5 vertebral body. There is anterior height loss of L5. Small amount of fluid or edema in the anterior epidural space at L5  (series 7, image 15). There is prevertebral soft tissue edema as well as edematous changes within the bilateral iliopsoas musculature, right greater than left. No organized psoas fluid collection. Small bilateral facet joint effusions at L4-5. The sacrum is intact without fracture or marrow signal abnormality. No evidence of sacroiliitis or SI joint septic arthritis. There is nonspecific presacral edema. Circumferential rectal wall thickening and edema within the perirectal fat. Partially visualized ascites throughout the abdomen and pelvis, increased from prior CT. Small bilateral hip joint effusions, nonspecific. There is nonspecific intramuscular edema within the paraspinal and visualized bilateral gluteal musculature. IMPRESSION: 1. 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 Small amount of fluid or edema in the anterior epidural space at L5 may be reactive or potentially represent developing phlegmon. If further imaging evaluation is clinically indicated, a contrast-enhanced MRI of the lumbar spine could be performed. 2. Prevertebral soft tissue edema as well as edematous changes within the bilateral iliopsoas musculature, right greater than left. No organized psoas fluid collection. 3. Partially visualized ascites throughout the abdomen and pelvis, increased from prior CT. 4. Circumferential rectal wall thickening and edema within the perirectal fat  compatible with proctitis. 5. Small bilateral hip joint effusions, nonspecific. 6. Diffuse intramuscular edema within the paraspinal and visualized bilateral gluteal musculature. These results will be called to the ordering clinician or representative by the Radiologist Assistant, and communication documented in the PACS or [employer]. Electronically Signed   By: Davina Poke D.O.   On:  04/19/2020 18:48        Scheduled Meds:  apixaban  2.5 mg Oral BID   diltiazem  120 mg Oral Daily   ferrous sulfate  325 mg Oral Q breakfast   furosemide  20 mg Oral BID   gabapentin  300 mg Oral QHS   guaiFENesin  1,200 mg Oral BID   insulin aspart  0-9 Units Subcutaneous TID WC   lactulose  10 g Oral BID   levothyroxine  88 mcg Oral Q0600   metoprolol tartrate  25 mg Oral BID   pantoprazole  40 mg Oral Daily   sodium bicarbonate  1,300 mg Oral TID   sodium chloride flush  3 mL Intravenous Q12H   Continuous Infusions:  sodium chloride Stopped (04/01/20 0906)     LOS: 23 days    Bonnielee Haff, MD [facility]   To contact the attending provider between 7A-7P or the covering provider during after hours 7P-7A, please log into the web site [email or web address] and access using universal [place] password for that web site. If you do not have the password, please call the hospital operator.  04/21/2020, 9:12 AM
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
## 2020-04-21 NOTE — Progress Notes (Signed)
Notified by IR that before bx procedure, patient needs to be off blood thinner for 48 hours, Eliquis last given at bedtime last night.  Will hold this AM - notified hospitalist.

## 2020-04-22 ENCOUNTER — Inpatient Hospital Stay (HOSPITAL_COMMUNITY): Payer: Medicare Other

## 2020-04-22 DIAGNOSIS — N179 Acute kidney failure, unspecified: Secondary | ICD-10-CM | POA: Diagnosis not present

## 2020-04-22 DIAGNOSIS — M4626 Osteomyelitis of vertebra, lumbar region: Secondary | ICD-10-CM | POA: Diagnosis not present

## 2020-04-22 DIAGNOSIS — R109 Unspecified abdominal pain: Secondary | ICD-10-CM | POA: Diagnosis not present

## 2020-04-22 DIAGNOSIS — L89139 Pressure ulcer of right lower back, unspecified stage: Secondary | ICD-10-CM | POA: Diagnosis not present

## 2020-04-22 DIAGNOSIS — I480 Paroxysmal atrial fibrillation: Secondary | ICD-10-CM | POA: Diagnosis not present

## 2020-04-22 DIAGNOSIS — G9341 Metabolic encephalopathy: Secondary | ICD-10-CM | POA: Diagnosis not present

## 2020-04-22 DIAGNOSIS — M4647 Discitis, unspecified, lumbosacral region: Secondary | ICD-10-CM | POA: Diagnosis not present

## 2020-04-22 HISTORY — PX: IR LUMBAR DISC ASPIRATION W/IMG GUIDE: IMG5306

## 2020-04-22 LAB — PROTIME-INR
INR: 1.5 — ABNORMAL HIGH (ref 0.8–1.2)
Prothrombin Time: 17.7 seconds — ABNORMAL HIGH (ref 11.4–15.2)

## 2020-04-22 LAB — CBC
HCT: 26.9 % — ABNORMAL LOW (ref 36.0–46.0)
Hemoglobin: 8.8 g/dL — ABNORMAL LOW (ref 12.0–15.0)
MCH: 25.1 pg — ABNORMAL LOW (ref 26.0–34.0)
MCHC: 32.7 g/dL (ref 30.0–36.0)
MCV: 76.9 fL — ABNORMAL LOW (ref 80.0–100.0)
Platelets: 469 10*3/uL — ABNORMAL HIGH (ref 150–400)
RBC: 3.5 MIL/uL — ABNORMAL LOW (ref 3.87–5.11)
RDW: 30.1 % — ABNORMAL HIGH (ref 11.5–15.5)
WBC: 17.7 10*3/uL — ABNORMAL HIGH (ref 4.0–10.5)
nRBC: 0 % (ref 0.0–0.2)

## 2020-04-22 LAB — COMPREHENSIVE METABOLIC PANEL
ALT: 17 U/L (ref 0–44)
AST: 53 U/L — ABNORMAL HIGH (ref 15–41)
Albumin: 2.6 g/dL — ABNORMAL LOW (ref 3.5–5.0)
Alkaline Phosphatase: 221 U/L — ABNORMAL HIGH (ref 38–126)
Anion gap: 9 (ref 5–15)
BUN: 32 mg/dL — ABNORMAL HIGH (ref 8–23)
CO2: 25 mmol/L (ref 22–32)
Calcium: 8.5 mg/dL — ABNORMAL LOW (ref 8.9–10.3)
Chloride: 108 mmol/L (ref 98–111)
Creatinine, Ser: 2.57 mg/dL — ABNORMAL HIGH (ref 0.44–1.00)
GFR, Estimated: 19 mL/min — ABNORMAL LOW (ref >60)
Glucose, Bld: 117 mg/dL — ABNORMAL HIGH (ref 70–99)
Potassium: 3.7 mmol/L (ref 3.5–5.1)
Sodium: 142 mmol/L (ref 135–145)
Total Bilirubin: 0.7 mg/dL (ref 0.3–1.2)
Total Protein: 6.9 g/dL (ref 6.5–8.1)

## 2020-04-22 LAB — GLUCOSE, CAPILLARY
Glucose-Capillary: 117 mg/dL — ABNORMAL HIGH (ref 70–99)
Glucose-Capillary: 106 mg/dL — ABNORMAL HIGH (ref 70–99)
Glucose-Capillary: 97 mg/dL (ref 70–99)
Glucose-Capillary: 175 mg/dL — ABNORMAL HIGH (ref 70–99)

## 2020-04-22 MED ORDER — MIDAZOLAM HCL 2 MG/2ML IJ SOLN
INTRAMUSCULAR | Status: AC | PRN
  Administered 2020-04-22: 1 mg via INTRAVENOUS

## 2020-04-22 MED ORDER — COLCHICINE 0.6 MG PO TABS
0.6000 mg | ORAL_TABLET | Freq: Every day | ORAL | Status: DC | PRN
  Administered 2020-04-22: 0.6 mg via ORAL
  Filled 2020-04-22 (×2): qty 1

## 2020-04-22 MED ORDER — LIDOCAINE HCL (PF) 1 % IJ SOLN
INTRAMUSCULAR | Status: AC
  Filled 2020-04-22: qty 30

## 2020-04-22 MED ORDER — SODIUM CHLORIDE (PF) 0.9 % IJ SOLN
INTRAMUSCULAR | Status: AC
  Filled 2020-04-22: qty 10

## 2020-04-22 MED ORDER — FENTANYL CITRATE (PF) 100 MCG/2ML IJ SOLN
INTRAMUSCULAR | Status: AC
  Filled 2020-04-22: qty 2

## 2020-04-22 MED ORDER — MIDAZOLAM HCL 2 MG/2ML IJ SOLN
INTRAMUSCULAR | Status: AC
  Filled 2020-04-22: qty 2

## 2020-04-22 MED ORDER — FENTANYL CITRATE (PF) 100 MCG/2ML IJ SOLN
INTRAMUSCULAR | Status: AC | PRN
Start: 2020-04-22 — End: 2020-04-22
  Administered 2020-04-22: 50 ug via INTRAVENOUS

## 2020-04-22 MED ORDER — ALLOPURINOL 100 MG PO TABS
100.0000 mg | ORAL_TABLET | Freq: Every day | ORAL | Status: DC
  Administered 2020-04-22 – 2020-04-25 (×4): 100 mg via ORAL
  Filled 2020-04-22 (×4): qty 1

## 2020-04-22 MED ORDER — LIDOCAINE HCL (PF) 1 % IJ SOLN
INTRAMUSCULAR | Status: AC | PRN
  Administered 2020-04-22: 10 mL

## 2020-04-22 NOTE — Progress Notes (Signed)
PROGRESS NOTE    Sarah Colon  NFA:[id number] DOB: 1946-03-02 DOA: 03/23/2020 PCP: Patient, No Pcp Per   Chief Complaint  Patient presents with  . Cough  . Shortness of Breath   Brief Narrative: The patient is a 74 year old African-American female with a past medical history significant for but not limited to diabetes mellitus type 2, diastolic CHF, hypertension, paroxysmal atrial fibrillation, chronic kidney disease stage IV with a baseline creatinine of 1.5-1.8 as well as other comorbidities who presented to the ED with shortness of breath, chest pain, fatigue over the last week. She also reported a poor appetite. She is found to have a hemoglobin of 3 on admission. There are no reports of blood in her stool or dark or tarry stools. Fecal occult test in the ED was positive. GI was consulted and she was admitted to the hospitalist service. She normally lives in SNF is nonambulatory and in the wheelchair. She underwent EGD and colonoscopy without evidence of any bleeding. She had multiple polyp removal from her colonoscopy today. Her prep was poor. GI recommending resuming Xarelto 04/03/20 and she received another dose of diuresis yesterday given her volume overload. Her Xarelto is now resumed today but this evening on 04/04/2019 when she spiked a temperature and she does have a leukocytosis which is chronic but we will panculture to rule out infection.  She is complaining of some abdominal pain so we will get a CT of the abdomen pelvis. Chest x-ray shows possible pneumonia so she was started on Unasyn and SLP was consulted given the location being the Right Lung Base.  Patient was found to have a UTI with Providencia and Entercoccus Faecalis.  She completed course of IV antibiotics.    Repeat CT scan showed persistent mild perirectal and presacral fat stranding of unclear etiology and presacral rectal findings associate with cortical erosions, sclerosis and thinning of the S1  vertebral body. As well as the urinary bladder that was thickened circumferentially.   GI feels that the CT findings are in the setting of fecal impaction and due to stool inflammation from the fecal impaction. They feel that her anemia worsens and she will need a flex sig with enemas for prep prior to discharge to reevaluate the rectum after resolution of the fecal impaction.   She continued to have worsening renal function. Nephrology feels that Contrast from last CT scan caused her AKI and GI has signed off and feel as if her Abdominal Pain is multifactorial. They are going to hold off on Flex Sigmoidoscopy.  Renal function is improving.  MRI of sacrum was obtained due to abnormal CT scan which shows findings concerning for discitis/osteomyelitis.  Infectious disease was consulted.  Plan is for disc aspiration by interventional radiology.  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  No significant drainage as per nursing staff.   Patient however continues to have low back pain.  CRP was 1.4.  ESR 37.  Infectious disease consulted who has recommended a CT-guided aspiration of that area.  Holding off on antibiotics for now.  Interventional radiology plans to do the procedure sometime today.  Continues to have back pain.  WBC noted to be higher today.  Acute renal failure on chronic kidney disease stage IV  Volume overload  Acute on Chronic Diastolic HF Baseline creatinine approximately 2.  Acute renal failure most likely related to contrast nephropathy. Creatinine peaked at 4.86.  Patient was seen by nephrology.  Placed on low-dose diuretics.  Renal function has been improving.  Creatinine noted to be 2.57.   Renal function appears to have plateaued.  This could be her new baseline.  Continue to monitor urine output.  Symptomatic anemia, concern for GI bleed  Iron Def Anemia Hb on 03/13/2020 was 3.1. S/p 3 units pRBC  Labs c/w iron def anemia -> start iron supplementation, follow outpatient  On xarelto prior to admission  Upper endoscopy with normal mucosa in stomach (biopsied).  Normal duodenal bulb, first portion of duodenum and second portion of duodenum.  Colonoscopy with inadequate prep - perianal skin tags, three 2-5 mm polyps removed with hot snare, six 3-8 mm polyps removed with hot snare, three 5-9 mm polyps removed with hot snare Follow pathology -- tubular adenoma, no high grade dysplasia or carcinoma (11/3).  11/1, benign gastric mucosa, negative for H pylori. Needs repeat colonoscopy in 6 months due to poor prep  Unable to resume Xarelto due to renal function - Eliquis started at 2.5 mg twice daily. Currently on hold for IR procedure. CT abdomen and pelvis was done which showed focal rectal wall thickening, acute cellular fluid as well as circumferential thickened bladder. GI was reconsulted and patient given a soapsuds enema due to concerns for fecal impaction.  Gastroenterology signed off.  No evidence for overt bleeding.  Hemoglobin has been stable.  Early Satiety/Abdominal Discomfort Dysphagia/fecal impaction Could be related to volume overload.  Patient underwent extensive GI work-up as mentioned above.  Patient continues to have pain in her abdomen especially after she eats.  There was some concern for biliary colic.  Imaging study showed large peripherally calcified gallstone within gallbladder lumen.  Outpatient follow-up with general surgery.  Underwent barium swallow as well which did not pass through the distal esophagus but the patient did undergo EGD during this hospitalization which did not show any abnormality in the esophagus.   Patient was seen in consultation by GI who felt  patient had a component of fecal impaction causing her abdominal pain. Soapsuds enema was ordered early on during the hospitalization. Patient with complaints of diffuse abdominal pain and initially noted not to have had a bowel movement in several days. Patient status post enema and having bowel movementswith clinical improvement with abdominal pain. Continue lactulose.  Abdominal discomfort thought to be multifactorial, biliary colic, constipation.  No further work-up recommended by gastroenterology.  Continue PPI.  Outpatient follow-up with gastroenterology.  Providencia stuartii and VRE UTI Patient noted to have had a fever with a leukocytosis. Fever likely secondary from UTI.  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 Status post full courseIV antibiotics.   Liver cirrhosis New diagnosis. MRI of abdomen was degraded exam however no concerning lesions noted repeat MRI recommended which can be done in the outpatient setting. Outpatient follow-up with GI.  Acute hepatitis panel showed equivocal results for hepatitis A antibody.  Will need to be repeated in the outpatient setting.    Acute metabolic encephalopathy Likely secondary to symptomatic profound anemia. Ammonia levels within normal limits. Clinical improvement and likely at baseline.  It looks like she was started on rifaximin for possible hepatic encephalopathy which was not the case.  Rifaximin discontinued.  Continue lactulose for constipation.  Metabolic acidosis Likely secondary to acute kidney injury. Remains on oral  sodium bicarbonate.  Stable.  Hyponatremia Likely due to volume overload.  Now resolved.  Potassium abnormalities Was initially hyperkalemic which improved.  Then became hypokalemic.  Potassium level stable.   Hypothyroidism Continue Synthroid.  Well-controlled diabetes mellitus type 2 Hemoglobin A1c 5.6.Sliding scale insulin.  CBGs are reasonably well controlled.  Low  levels this morning likely due to poor oral intake.  Paroxysmal atrial fibrillation Continue Cardizem and Lopressor for rate control. Hemoglobin stable. Xarelto not resumed due to renal function.  Currently on Eliquis twice daily but has been held due to need for procedure by interventional radiology.   Leukocytosis Reason unclear.  Thought to be reactive.  Now she is noted to have possible discitis.    Right upper extremity edema Follow RUE [place] - negative for DVT  History of gout with the bilateral knee pain Resume allopurinol.  Colchicine as needed.  Sacral ulcer, POA Pressure Injury 04/19/2020 Sacrum Medial Stage III -  Full thickness tissue loss. Subcutaneous fat may be visible but bone, tendon or muscle are NOT exposed. (Active)  04/18/2020 1149  Location: Sacrum  Location Orientation: Medial  Staging: Stage III -  Full thickness tissue loss. Subcutaneous fat may be visible but bone, tendon or muscle are NOT exposed.  Wound Description (Comments):   Present on Admission: Yes   GOC appreciate palliative assistance, See 11/15 note  DVT prophylaxis: eliquis Code Status: DNR Family Communication: daughter Disposition: To skilled nursing facility when medically stable.  Status is: Inpatient  Remains inpatient appropriate because:Inpatient level of care appropriate due to severity of illness   Dispo: The patient is from: Home              Anticipated d/c is to: SNF              Anticipated d/c date is: > 3 days              Patient currently is not medically stable to d/c.       Consultants:   Nephrology  [facility]   Palliative  Infectious disease  Procedures:  UE [place] Summary:  Right:  No evidence of deep vein thrombosis in the upper extremity. No evidence of  superficial vein thrombosis in the upper extremity.  Left:  No evidence of thrombosis in the subclavian.   Echo IMPRESSIONS  1. Left ventricular ejection fraction, by estimation, is 65 to 70%.  The  left ventricle has normal function. The left ventricle has no regional  wall motion abnormalities. There is mild concentric left ventricular  hypertrophy. Left ventricular diastolic  parameters are consistent with Grade I diastolic dysfunction (impaired  relaxation). Elevated left ventricular end-diastolic pressure.  2. Right ventricular systolic function is normal. The right ventricular  size is normal. There is moderately elevated pulmonary artery systolic  pressure. The estimated right ventricular systolic pressure is 44.0 mmHg.  3. The mitral valve is normal in structure. Mild mitral valve  regurgitation. No evidence of mitral stenosis. Moderate mitral annular  calcification.  4. Tricuspid valve regurgitation is mild to moderate.  5. The aortic valve is tricuspid. Aortic valve regurgitation is not  visualized. Mild to moderate aortic valve sclerosis/calcification is  present, without any evidence of aortic stenosis.  6. The inferior vena cava is dilated in size with >50% respiratory  variability, suggesting right atrial pressure of 8 mmHg.   Colonoscopy Impression - Preparation of the colon was inadequate. - Perianal skin tags found on perianal exam. - Stool in the rectum, in the  recto-sigmoid colon, in the sigmoid colon and in the descending colon. - Three 2 to 5 mm polyps in the cecum, removed with a hot snare. Resected and retrieved. - Six 3 to 8 mm polyps in the ascending colon, removed with a hot snare. Resected and retrieved. - Three 5 to 9 mm polyps in the transverse colon, removed with a hot snare. Resected and Retrieved. Recommendation - Return patient to hospital ward for ongoing care. - Soft diet. - Continue present medications. - Await pathology results. - Repeat colonoscopy in 6 months because the bowel preparation was suboptimal. - Return to GI office in 2 months.  EGD Impression - Z-line regular, 38 cm from the incisors. - Normal mucosa was  found in the entire stomach. Biopsied. - Normal duodenal bulb, first portion of the duodenum and second portion of the Duodenum. Recommendation - Return patient to hospital ward for ongoing care. - Clear liquid diet. - Continue present medications. - Perform a colonoscopy tomorrow.  LE [place] Summary:  BILATERAL:  - No evidence of deep vein thrombosis seen in the lower extremities,  bilaterally.  - No evidence of superficial venous thrombosis in the lower extremities,  bilaterally.  -No evidence of popliteal cyst, bilaterally.   Antimicrobials: Anti-infectives (From admission, onward)   Start     Dose/Rate Route Frequency Ordered Stop   04/19/20 0000  rifaximin (XIFAXAN) 550 MG TABS tablet        550 mg Oral 2 times daily 04/19/20 1311 05/19/20 2359   04/06/20 1430  piperacillin-tazobactam (ZOSYN) IVPB 2.25 g        2.25 g 100 mL/hr over 30 Minutes Intravenous Every 6 hours 04/06/20 1320 04/11/20 2359   04/05/20 1930  meropenem (MERREM) 1 g in sodium chloride 0.9 % 100 mL IVPB  Status:  Discontinued        1 g 200 mL/hr over 30 Minutes Intravenous Every 12 hours 04/05/20 1832 04/06/20 1320   04/04/20 1000  Ampicillin-Sulbactam (UNASYN) 3 g in sodium chloride 0.9 % 100 mL IVPB  Status:  Discontinued        3 g 200 mL/hr over 30 Minutes Intravenous Every 12 hours 04/04/20 0851 04/05/20 1829   03/30/20 1400  rifaximin (XIFAXAN) tablet 550 mg  Status:  Discontinued        550 mg Oral 2 times daily 03/30/20 1136 04/20/20 1122         Subjective: Patient complains of knee pain on both sides.  Denies any falls or injuries.  She attributes it to her gout.  Continues to have back pain.  No nausea vomiting.  Abdominal pain seems to be stable.     Objective: Vitals:   04/21/20 0509 04/21/20 1428 04/21/20 2119 04/22/20 0502  BP: (!) 130/59 (!) 159/82 (!) 130/58 (!) 139/51  Pulse: 73 (!) 103 78 72  Resp: (!) '21 16  16  ' Temp: 98.7 F (37.1 C) 98.3 F (36.8 C) 98.4 F (36.9 C) 99.1  F (37.3 C)  TempSrc: Oral Oral Oral Oral  SpO2: 92% 97% 96% 97%  Weight:      Height:        Intake/Output Summary (Last 24 hours) at 04/22/2020 0933 Last data filed at 04/22/2020 0505 Gross per 24 hour  Intake 240 ml  Output 500 ml  Net -260 ml   Filed Weights   04/19/20 0500 04/20/20 0500 04/21/20 0500  Weight: 69.4 kg 69.5 kg 70.3 kg    Examination:  General appearance: Awake alert.  In  no distress.  Noted to be in discomfort Resp: Clear to auscultation bilaterally.  Normal effort Cardio: S1-S2 is normal regular.  No S3-S4.  No rubs murmurs or bruit GI: Abdomen is soft.  Mildly tender diffusely without any rebound rigidity or guarding.  No masses organomegaly.  Bowel sounds are present and normal.   Extremities: Limited range of motion of both knees.  No swelling noted.  No erythema.  No warmth to touch.  Tender with movement. Neurologic: Alert and oriented x3.  No focal neurological deficits.      Data Reviewed: I have personally reviewed following labs and imaging studies  CBC: Recent Labs  Lab 04/17/20 0650 04/17/20 0650 04/18/20 0509 04/19/20 1133 04/20/20 1107 04/21/20 0437 04/22/20 0832  WBC 15.7*   < > 15.6* 16.4* 16.7* 15.9* 17.7*  NEUTROABS 10.5*  --  10.2* 11.6* 11.8*  --   --   HGB 8.7*   < > 8.2* 8.2* 8.9* 8.3* 8.8*  HCT 26.1*   < > 26.2* 25.8* 27.5* 25.8* 26.9*  MCV 75.7*   < > 79.2* 77.5* 77.0* 77.0* 76.9*  PLT 697*   < > 632* 614* 617* 520* 469*   < > = values in this interval not displayed.    Basic Metabolic Panel: Recent Labs  Lab 04/16/20 0450 04/16/20 0450 04/17/20 0650 04/18/20 0509 04/19/20 1133 04/20/20 1107 04/21/20 0437  NA 136   < > 138 137  137 138 139 142  K 3.5   < > 3.4* 3.2*  3.2* 3.1* 3.3* 4.0  CL 107   < > 105 109  109 109 109 109  CO2 17*   < > 19* 18*  19* 19* 23 22  GLUCOSE 108*   < > 120* 106*  107* 160* 154* 110*  BUN 40*   < > 38* 33*  36* 35* 34* 35*  CREATININE 4.17*   < > 3.95* 3.60*  3.58* 3.33*  2.90* 2.62*  CALCIUM 8.2*   < > 8.3* 8.3*  8.3* 8.1* 8.2* 8.3*  MG  --   --  1.8 1.7 1.8 1.4*  --   PHOS 6.1*  --  6.3* 5.9*  6.0* 5.7* 5.0*  --    < > = values in this interval not displayed.    GFR: Estimated Creatinine Clearance: 16.6 mL/min (A) (by C-G formula based on SCr of 2.62 mg/dL (H)).  Liver Function Tests: Recent Labs  Lab 04/17/20 0650 04/18/20 0509 04/19/20 1133 04/20/20 1107 04/21/20 0437  AST 45* 42* 45* 48* 42*  ALT '16 15 15 16 15  ' ALKPHOS 188* 170* 185* 187* 179*  BILITOT 0.6 0.5 0.6 0.6 0.7  PROT 6.8 6.1* 6.4* 6.7 6.4*  ALBUMIN 2.6* 2.4*  2.4* 2.6* 2.6* 2.5*    CBG: Recent Labs  Lab 04/21/20 0754 04/21/20 1133 04/21/20 1620 04/21/20 2121 04/22/20 0750  GLUCAP 82 139* 170* 115* 117*     Recent Results (from the past 240 hour(s))  Resp Panel by RT-PCR (Flu A&B, Covid) Nasopharyngeal Swab     Status: None   Collection Time: 04/18/20 12:21 PM   Specimen: Nasopharyngeal Swab; Nasopharyngeal(NP) swabs in vial transport medium  Result Value Ref Range Status   SARS Coronavirus 2 by RT PCR NEGATIVE NEGATIVE Final    Comment: (NOTE) SARS-CoV-2 target nucleic acids are NOT DETECTED.  The SARS-CoV-2 RNA is generally detectable in upper respiratory specimens during the acute phase of infection. The lowest concentration of SARS-CoV-2 viral copies this assay can detect  is 138 copies/mL. A negative result does not preclude SARS-Cov-2 infection and should not be used as the sole basis for treatment or other patient management decisions. A negative result may occur with  improper specimen collection/handling, submission of specimen other than nasopharyngeal swab, presence of viral mutation(s) within the areas targeted by this assay, and inadequate number of viral copies(<138 copies/mL). A negative result must be combined with clinical observations, patient history, and epidemiological information. The expected result is Negative.  Fact Sheet for  Patients:  EntrepreneurPulse.com.au  Fact Sheet for Healthcare Providers:  IncredibleEmployment.be  This test is no t yet approved or cleared by the [place] FDA and  has been authorized for detection and/or diagnosis of SARS-CoV-2 by FDA under an Emergency Use Authorization (EUA). This EUA will remain  in effect (meaning this test can be used) for the duration of the COVID-19 declaration under Section 564(b)(1) of the Act, 21 U.S.C.section 360bbb-3(b)(1), unless the authorization is terminated  or revoked sooner.       Influenza A by PCR NEGATIVE NEGATIVE Final   Influenza B by PCR NEGATIVE NEGATIVE Final    Comment: (NOTE) The Xpert Xpress SARS-CoV-2/FLU/RSV plus assay is intended as an aid in the diagnosis of influenza from Nasopharyngeal swab specimens and should not be used as a sole basis for treatment. Nasal washings and aspirates are unacceptable for Xpert Xpress SARS-CoV-2/FLU/RSV testing.  Fact Sheet for Patients: EntrepreneurPulse.com.au  Fact Sheet for Healthcare Providers: IncredibleEmployment.be  This test is not yet approved or cleared by the [place] FDA and has been authorized for detection and/or diagnosis of SARS-CoV-2 by FDA under an Emergency Use Authorization (EUA). This EUA will remain in effect (meaning this test can be used) for the duration of the COVID-19 declaration under Section 564(b)(1) of the Act, 21 U.S.C. section 360bbb-3(b)(1), unless the authorization is terminated or revoked.  Performed at [facility] [place] [address], [place], [place] [address]          Radiology Studies: No results found.      Scheduled Meds: . allopurinol  100 mg Oral Daily  . [START ON 04/24/2020] apixaban  2.5 mg Oral BID  . diltiazem  120 mg Oral Daily  . feeding supplement (GLUCERNA SHAKE)  237 mL Oral TID BM  . ferrous sulfate  325 mg Oral Q  breakfast  . furosemide  20 mg Oral BID  . gabapentin  300 mg Oral QHS  . guaiFENesin  1,200 mg Oral BID  . insulin aspart  0-9 Units Subcutaneous TID WC  . lactulose  10 g Oral BID  . levothyroxine  88 mcg Oral Q0600  . metoprolol tartrate  25 mg Oral BID  . pantoprazole  40 mg Oral Daily  . sodium bicarbonate  1,300 mg Oral TID  . sodium chloride flush  3 mL Intravenous Q12H   Continuous Infusions: . sodium chloride Stopped (04/01/20 0906)     LOS: 24 days    Bonnielee Haff, MD [facility]   To contact the attending provider between 7A-7P or the covering provider during after hours 7P-7A, please log into the web site [email or web address] and access using universal [place] password for that web site. If you do not have the password, please call the hospital operator.  04/22/2020, 9:33 AM

## 2020-04-22 NOTE — Procedures (Signed)
Interventional Radiology Procedure Note  Procedure: L4-L5 disc aspiration yielding 2 mL turbid serous fluid  Complications: None  Estimated Blood Loss: None  Recommendations: - Sent for culture  Signed,  Criselda Peaches, MD

## 2020-04-22 NOTE — Progress Notes (Signed)
   04/22/20 1305  Assess: MEWS Score  Temp 99.5 F (37.5 C)  BP (!) 168/73  Pulse Rate (!) 109  Resp 13  SpO2 97 %  O2 Device Room Air  Assess: MEWS Score  MEWS Temp 0  MEWS Systolic 0  MEWS Pulse 1  MEWS RR 1  MEWS LOC 0  MEWS Score 2  MEWS Score Color Yellow  Assess: if the MEWS score is Yellow or Red  Were vital signs taken at a resting state? Yes  Focused Assessment No change from prior assessment  Early Detection of Sepsis Score *See Row Information* Low  MEWS guidelines implemented *See Row Information* Yes  Take Vital Signs  Increase Vital Sign Frequency  Yellow: Q 2hr X 2 then Q 4hr X 2, if remains yellow, continue Q 4hrs  Escalate  MEWS: Escalate Yellow: discuss with charge nurse/RN and consider discussing with provider and RRT  Notify: Charge Nurse/RN  Name of Charge Nurse/RN Notified Megan, RN  Date Charge Nurse/RN Notified 04/22/20  Time Charge Nurse/RN Notified 1315

## 2020-04-22 NOTE — Progress Notes (Signed)
Subjective:  C/o bilateral knee pain   Antibiotics:  Anti-infectives (From admission, onward)   Start     Dose/Rate Route Frequency Ordered Stop   04/19/20 0000  rifaximin (XIFAXAN) 550 MG TABS tablet        550 mg Oral 2 times daily 04/19/20 1311 05/19/20 2359   04/06/20 1430  piperacillin-tazobactam (ZOSYN) IVPB 2.25 g        2.25 g 100 mL/hr over 30 Minutes Intravenous Every 6 hours 04/06/20 1320 04/11/20 2359   04/05/20 1930  meropenem (MERREM) 1 g in sodium chloride 0.9 % 100 mL IVPB  Status:  Discontinued        1 g 200 mL/hr over 30 Minutes Intravenous Every 12 hours 04/05/20 1832 04/06/20 1320   04/04/20 1000  Ampicillin-Sulbactam (UNASYN) 3 g in sodium chloride 0.9 % 100 mL IVPB  Status:  Discontinued        3 g 200 mL/hr over 30 Minutes Intravenous Every 12 hours 04/04/20 0851 04/05/20 1829   03/30/20 1400  rifaximin (XIFAXAN) tablet 550 mg  Status:  Discontinued        550 mg Oral 2 times daily 03/30/20 1136 04/20/20 1122      Medications: Scheduled Meds: . allopurinol  100 mg Oral Daily  . [START ON 04/24/2020] apixaban  2.5 mg Oral BID  . diltiazem  120 mg Oral Daily  . feeding supplement (GLUCERNA SHAKE)  237 mL Oral TID BM  . ferrous sulfate  325 mg Oral Q breakfast  . furosemide  20 mg Oral BID  . gabapentin  300 mg Oral QHS  . guaiFENesin  1,200 mg Oral BID  . insulin aspart  0-9 Units Subcutaneous TID WC  . lactulose  10 g Oral BID  . levothyroxine  88 mcg Oral Q0600  . metoprolol tartrate  25 mg Oral BID  . pantoprazole  40 mg Oral Daily  . sodium bicarbonate  1,300 mg Oral TID  . sodium chloride flush  3 mL Intravenous Q12H   Continuous Infusions: . sodium chloride Stopped (04/01/20 0906)   PRN Meds:.sodium chloride, acetaminophen, colchicine, levalbuterol, ondansetron (ZOFRAN) IV, polyethylene glycol, sodium chloride flush, traMADol    Objective: Weight change:   Intake/Output Summary (Last 24 hours) at 04/22/2020 1002 Last data  filed at 04/22/2020 0505 Gross per 24 hour  Intake 120 ml  Output 500 ml  Net -380 ml   Blood pressure (!) 139/51, pulse 72, temperature 99.1 F (37.3 C), temperature source Oral, resp. rate 16, height 5' 1.5" (1.562 m), weight 70.3 kg, SpO2 97 %. Temp:  [98.3 F (36.8 C)-99.1 F (37.3 C)] 99.1 F (37.3 C) (11/23 0502) Pulse Rate:  [72-103] 72 (11/23 0502) Resp:  [16] 16 (11/23 0502) BP: (130-159)/(51-82) 139/51 (11/23 0502) SpO2:  [96 %-97 %] 97 % (11/23 0502)  Physical Exam: General: Alert and awake,not in any acute distress. HEENT: anicteric sclera, EOMI CVS regular rate, normal no mgr Chest: , no wheezing, no respiratory distress, clear anteriorly Abdomen: soft non-distended, nontender Right knee more tender than left and with more of an effusion Neuro: nonfocal  CBC:    BMET Recent Labs    04/21/20 0437 04/22/20 0832  NA 142 142  K 4.0 3.7  CL 109 108  CO2 22 25  GLUCOSE 110* 117*  BUN 35* 32*  CREATININE 2.62* 2.57*  CALCIUM 8.3* 8.5*     Liver Panel  Recent Labs    04/21/20 0437 04/22/20 4827  PROT 6.4* 6.9  ALBUMIN 2.5* 2.6*  AST 42* 53*  ALT 15 17  ALKPHOS 179* 221*  BILITOT 0.7 0.7       Sedimentation Rate Recent Labs    04/20/20 1107  ESRSEDRATE 50*   C-Reactive Protein Recent Labs    04/19/20 1133 04/20/20 1107  CRP 1.4* 1.5*    Micro Results: Recent Results (from the past 720 hour(s))  Respiratory Panel by RT PCR (Flu A&B, Covid) - Nasopharyngeal Swab     Status: None   Collection Time: 03/26/2020  2:57 PM   Specimen: Nasopharyngeal Swab  Result Value Ref Range Status   SARS Coronavirus 2 by RT PCR NEGATIVE NEGATIVE Final    Comment: (NOTE) SARS-CoV-2 target nucleic acids are NOT DETECTED.  The SARS-CoV-2 RNA is generally detectable in upper respiratoy specimens during the acute phase of infection. The lowest concentration of SARS-CoV-2 viral copies this assay can detect is 131 copies/mL. A negative result does not  preclude SARS-Cov-2 infection and should not be used as the sole basis for treatment or other patient management decisions. A negative result may occur with  improper specimen collection/handling, submission of specimen other than nasopharyngeal swab, presence of viral mutation(s) within the areas targeted by this assay, and inadequate number of viral copies (<131 copies/mL). A negative result must be combined with clinical observations, patient history, and epidemiological information. The expected result is Negative.  Fact Sheet for Patients:  PinkCheek.be  Fact Sheet for Healthcare Providers:  GravelBags.it  This test is no t yet approved or cleared by the [place] FDA and  has been authorized for detection and/or diagnosis of SARS-CoV-2 by FDA under an Emergency Use Authorization (EUA). This EUA will remain  in effect (meaning this test can be used) for the duration of the COVID-19 declaration under Section 564(b)(1) of the Act, 21 U.S.C. section 360bbb-3(b)(1), unless the authorization is terminated or revoked sooner.     Influenza A by PCR NEGATIVE NEGATIVE Final   Influenza B by PCR NEGATIVE NEGATIVE Final    Comment: (NOTE) The Xpert Xpress SARS-CoV-2/FLU/RSV assay is intended as an aid in  the diagnosis of influenza from Nasopharyngeal swab specimens and  should not be used as a sole basis for treatment. Nasal washings and  aspirates are unacceptable for Xpert Xpress SARS-CoV-2/FLU/RSV  testing.  Fact Sheet for Patients: PinkCheek.be  Fact Sheet for Healthcare Providers: GravelBags.it  This test is not yet approved or cleared by the [place] FDA and  has been authorized for detection and/or diagnosis of SARS-CoV-2 by  FDA under an Emergency Use Authorization (EUA). This EUA will remain  in effect (meaning this test can be used) for the  duration of the  Covid-19 declaration under Section 564(b)(1) of the Act, 21  U.S.C. section 360bbb-3(b)(1), unless the authorization is  terminated or revoked. Performed at [facility], [place] [address], [place], [place] [address]   MRSA PCR Screening     Status: Abnormal   Collection Time: 04/01/2020  7:44 AM   Specimen: Nasopharyngeal  Result Value Ref Range Status   MRSA by PCR POSITIVE (A) NEGATIVE Final    Comment:        The GeneXpert MRSA Assay (FDA approved for NASAL specimens only), is one component of a comprehensive MRSA colonization surveillance program. It is not intended to diagnose MRSA infection nor to guide or monitor treatment for MRSA infections. RESULT CALLED TO, READ BACK BY AND VERIFIED WITH: DARK,D. RN @1111  ON 11.01.2021 BY  COHEN,K Performed at [facility], [place] [address], [place], [place] [address]   Culture, blood (Routine X 2) w Reflex to ID Panel     Status: None   Collection Time: 04/04/20  6:25 AM   Specimen: BLOOD  Result Value Ref Range Status   Specimen Description   Final    BLOOD RIGHT ANTECUBITAL Performed at [place] [address] [place], [place] [address]    Special Requests   Final    BOTTLES DRAWN AEROBIC ONLY Blood Culture adequate volume Performed at [place] [address], [place], [place] [address]    Culture   Final    NO GROWTH 5 DAYS Performed at [facility], [place] [address] [place], [place] [address]    Report Status 04/09/2020 FINAL  Final  Culture, blood (Routine X 2) w Reflex to ID Panel     Status: None   Collection Time: 04/04/20  6:25 AM   Specimen: BLOOD RIGHT HAND  Result Value Ref Range Status   Specimen Description   Final    BLOOD RIGHT HAND Performed at [place] [address], [place], [place] [address]    Special Requests   Final    BOTTLES DRAWN AEROBIC AND ANAEROBIC Blood Culture  adequate volume Performed at [place] [address], [place], [place] [address]    Culture   Final    NO GROWTH 5 DAYS Performed at [facility], [place] [address] [place], [place] [address]    Report Status 04/09/2020 FINAL  Final  Culture, Urine     Status: Abnormal   Collection Time: 04/04/20  3:08 PM   Specimen: Urine, Random  Result Value Ref Range Status   Specimen Description   Final    URINE, RANDOM Performed at [place] [address] [place], [place] [address]    Special Requests   Final    NONE Performed at [facility], [place] [address], [place], [place] [address]    Culture (A)  Final    70,000 COLONIES/mL PROVIDENCIA STUARTII >=100,000 COLONIES/mL VANCOMYCIN RESISTANT ENTEROCOCCUS    Report Status 04/06/2020 FINAL  Final   Organism ID, Bacteria PROVIDENCIA STUARTII (A)  Final   Organism ID, Bacteria VANCOMYCIN RESISTANT ENTEROCOCCUS (A)  Final      Susceptibility   Providencia stuartii - MIC*    AMPICILLIN >=32 RESISTANT Resistant     CEFAZOLIN >=64 RESISTANT Resistant     CEFEPIME 0.25 SENSITIVE Sensitive     CEFTRIAXONE >=64 RESISTANT Resistant     CIPROFLOXACIN >=4 RESISTANT Resistant     GENTAMICIN RESISTANT Resistant     IMIPENEM 2 SENSITIVE Sensitive     NITROFURANTOIN 128 RESISTANT Resistant     TRIMETH/SULFA 160 RESISTANT Resistant     AMPICILLIN/SULBACTAM >=32 RESISTANT Resistant     PIP/TAZO <=4 SENSITIVE Sensitive     * 70,000 COLONIES/mL PROVIDENCIA STUARTII   Vancomycin resistant enterococcus - MIC*    AMPICILLIN <=2 SENSITIVE Sensitive     NITROFURANTOIN <=16 SENSITIVE Sensitive     VANCOMYCIN >=32 RESISTANT Resistant     LINEZOLID 2 SENSITIVE Sensitive     * >=100,000 COLONIES/mL VANCOMYCIN RESISTANT ENTEROCOCCUS  Resp Panel by RT-PCR (Flu A&B, Covid) Nasopharyngeal Swab     Status: None   Collection Time: 04/18/20 12:21 PM   Specimen: Nasopharyngeal Swab;  Nasopharyngeal(NP) swabs in vial transport medium  Result Value Ref Range Status  SARS Coronavirus 2 by RT PCR NEGATIVE NEGATIVE Final    Comment: (NOTE) SARS-CoV-2 target nucleic acids are NOT DETECTED.  The SARS-CoV-2 RNA is generally detectable in upper respiratory specimens during the acute phase of infection. The lowest concentration of SARS-CoV-2 viral copies this assay can detect is 138 copies/mL. A negative result does not preclude SARS-Cov-2 infection and should not be used as the sole basis for treatment or other patient management decisions. A negative result may occur with  improper specimen collection/handling, submission of specimen other than nasopharyngeal swab, presence of viral mutation(s) within the areas targeted by this assay, and inadequate number of viral copies(<138 copies/mL). A negative result must be combined with clinical observations, patient history, and epidemiological information. The expected result is Negative.  Fact Sheet for Patients:  EntrepreneurPulse.com.au  Fact Sheet for Healthcare Providers:  IncredibleEmployment.be  This test is no t yet approved or cleared by the [place] FDA and  has been authorized for detection and/or diagnosis of SARS-CoV-2 by FDA under an Emergency Use Authorization (EUA). This EUA will remain  in effect (meaning this test can be used) for the duration of the COVID-19 declaration under Section 564(b)(1) of the Act, 21 U.S.C.section 360bbb-3(b)(1), unless the authorization is terminated  or revoked sooner.       Influenza A by PCR NEGATIVE NEGATIVE Final   Influenza B by PCR NEGATIVE NEGATIVE Final    Comment: (NOTE) The Xpert Xpress SARS-CoV-2/FLU/RSV plus assay is intended as an aid in the diagnosis of influenza from Nasopharyngeal swab specimens and should not be used as a sole basis for treatment. Nasal washings and aspirates are unacceptable for Xpert Xpress  SARS-CoV-2/FLU/RSV testing.  Fact Sheet for Patients: EntrepreneurPulse.com.au  Fact Sheet for Healthcare Providers: IncredibleEmployment.be  This test is not yet approved or cleared by the [place] FDA and has been authorized for detection and/or diagnosis of SARS-CoV-2 by FDA under an Emergency Use Authorization (EUA). This EUA will remain in effect (meaning this test can be used) for the duration of the COVID-19 declaration under Section 564(b)(1) of the Act, 21 U.S.C. section 360bbb-3(b)(1), unless the authorization is terminated or revoked.  Performed at [facility], [place] [address], [place], [place] [address]     Studies/Results: No results found.    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   Diskitis:  Antibiotics currently being held  IR to biopsy disk space  for cultures  Bilateral knee pain: on colchicine if does not improve would ask orthopedics to aspirate joint for crystals, cell count, and culture       LOS: 24 days   Sarah Colon 04/22/2020, 10:02 AM

## 2020-04-23 DIAGNOSIS — M4640 Discitis, unspecified, site unspecified: Secondary | ICD-10-CM | POA: Diagnosis not present

## 2020-04-23 DIAGNOSIS — M4626 Osteomyelitis of vertebra, lumbar region: Secondary | ICD-10-CM | POA: Diagnosis not present

## 2020-04-23 DIAGNOSIS — G9341 Metabolic encephalopathy: Secondary | ICD-10-CM | POA: Diagnosis not present

## 2020-04-23 DIAGNOSIS — M4647 Discitis, unspecified, lumbosacral region: Secondary | ICD-10-CM | POA: Diagnosis not present

## 2020-04-23 DIAGNOSIS — L89139 Pressure ulcer of right lower back, unspecified stage: Secondary | ICD-10-CM | POA: Diagnosis not present

## 2020-04-23 LAB — COMPREHENSIVE METABOLIC PANEL
ALT: 16 U/L (ref 0–44)
AST: 43 U/L — ABNORMAL HIGH (ref 15–41)
Albumin: 2.3 g/dL — ABNORMAL LOW (ref 3.5–5.0)
Alkaline Phosphatase: 200 U/L — ABNORMAL HIGH (ref 38–126)
Anion gap: 10 (ref 5–15)
BUN: 35 mg/dL — ABNORMAL HIGH (ref 8–23)
CO2: 25 mmol/L (ref 22–32)
Calcium: 8.3 mg/dL — ABNORMAL LOW (ref 8.9–10.3)
Chloride: 109 mmol/L (ref 98–111)
Creatinine, Ser: 2.42 mg/dL — ABNORMAL HIGH (ref 0.44–1.00)
GFR, Estimated: 20 mL/min — ABNORMAL LOW (ref >60)
Glucose, Bld: 113 mg/dL — ABNORMAL HIGH (ref 70–99)
Potassium: 3.8 mmol/L (ref 3.5–5.1)
Sodium: 144 mmol/L (ref 135–145)
Total Bilirubin: 0.7 mg/dL (ref 0.3–1.2)
Total Protein: 6.3 g/dL — ABNORMAL LOW (ref 6.5–8.1)

## 2020-04-23 LAB — CBC
HCT: 25 % — ABNORMAL LOW (ref 36.0–46.0)
Hemoglobin: 8 g/dL — ABNORMAL LOW (ref 12.0–15.0)
MCH: 24.8 pg — ABNORMAL LOW (ref 26.0–34.0)
MCHC: 32 g/dL (ref 30.0–36.0)
MCV: 77.6 fL — ABNORMAL LOW (ref 80.0–100.0)
Platelets: 387 10*3/uL (ref 150–400)
RBC: 3.22 MIL/uL — ABNORMAL LOW (ref 3.87–5.11)
RDW: 30 % — ABNORMAL HIGH (ref 11.5–15.5)
WBC: 19.5 10*3/uL — ABNORMAL HIGH (ref 4.0–10.5)
nRBC: 0 % (ref 0.0–0.2)

## 2020-04-23 LAB — GLUCOSE, CAPILLARY
Glucose-Capillary: 103 mg/dL — ABNORMAL HIGH (ref 70–99)
Glucose-Capillary: 122 mg/dL — ABNORMAL HIGH (ref 70–99)
Glucose-Capillary: 113 mg/dL — ABNORMAL HIGH (ref 70–99)

## 2020-04-23 MED ORDER — SODIUM CHLORIDE 0.9 % IV SOLN
2.0000 g | INTRAVENOUS | Status: DC
  Administered 2020-04-23 – 2020-04-26 (×4): 2 g via INTRAVENOUS
  Filled 2020-04-23 (×4): qty 2

## 2020-04-23 MED ORDER — SODIUM CHLORIDE 0.9 % IV SOLN
500.0000 mg | INTRAVENOUS | Status: DC
  Administered 2020-04-23 – 2020-05-03 (×6): 500 mg via INTRAVENOUS
  Filled 2020-04-23 (×6): qty 10

## 2020-04-23 NOTE — Progress Notes (Signed)
Pharmacy Antibiotic Note  Sarah Colon is a 74 y.o. female admitted on 03/15/2020 with L4-5 discitis/osteomyelitis. She is s/p disc aspiration on 11/23. Pharmacy has been consulted for daptomycin dosing. Patient has CKD with current SCr- 2.42 and CrCl ~ 18 ml/min.   Plan: Daptomycin 500 mg (~ 8 mg/kg AdjBW) every 48 hours Cefepime 2 gm IV every 24 hours Baseline CK tomorrow AM and weekly on Thursdays    Height: 5' 1.5" (156.2 cm) Weight: 70.3 kg (155 lb) IBW/kg (Calculated) : 48.95  Temp (24hrs), Avg:99 F (37.2 C), Min:98.5 F (36.9 C), Max:99.5 F (37.5 C)  Recent Labs  Lab 04/19/20 1133 04/20/20 1107 04/21/20 0437 04/22/20 0832 04/23/20 0520  WBC 16.4* 16.7* 15.9* 17.7* 19.5*  CREATININE 3.33* 2.90* 2.62* 2.57* 2.42*    Estimated Creatinine Clearance: 18 mL/min (A) (by C-G formula based on SCr of 2.42 mg/dL (H)).    Allergies  Allergen Reactions  . Codeine Other (See Comments)    Swelling      Thank you for allowing pharmacy to be a part of this patient's care.  Jimmy Footman, PharmD, BCPS, BCIDP Infectious Diseases Clinical Pharmacist Phone: [phone] 04/23/2020 1:30 PM

## 2020-04-23 NOTE — Progress Notes (Signed)
Subjective:  C/o bilateral knee pain   Antibiotics:  Anti-infectives (From admission, onward)   Start     Dose/Rate Route Frequency Ordered Stop   04/23/20 1800  DAPTOmycin (CUBICIN) 500 mg in sodium chloride 0.9 % IVPB        500 mg 220 mL/hr over 30 Minutes Intravenous Every 48 hours 04/23/20 1330     04/23/20 1430  ceFEPIme (MAXIPIME) 2 g in sodium chloride 0.9 % 100 mL IVPB        2 g 200 mL/hr over 30 Minutes Intravenous Every 24 hours 04/23/20 1321     04/19/20 0000  rifaximin (XIFAXAN) 550 MG TABS tablet        550 mg Oral 2 times daily 04/19/20 1311 05/19/20 2359   04/06/20 1430  piperacillin-tazobactam (ZOSYN) IVPB 2.25 g        2.25 g 100 mL/hr over 30 Minutes Intravenous Every 6 hours 04/06/20 1320 04/11/20 2359   04/05/20 1930  meropenem (MERREM) 1 g in sodium chloride 0.9 % 100 mL IVPB  Status:  Discontinued        1 g 200 mL/hr over 30 Minutes Intravenous Every 12 hours 04/05/20 1832 04/06/20 1320   04/04/20 1000  Ampicillin-Sulbactam (UNASYN) 3 g in sodium chloride 0.9 % 100 mL IVPB  Status:  Discontinued        3 g 200 mL/hr over 30 Minutes Intravenous Every 12 hours 04/04/20 0851 04/05/20 1829   03/30/20 1400  rifaximin (XIFAXAN) tablet 550 mg  Status:  Discontinued        550 mg Oral 2 times daily 03/30/20 1136 04/20/20 1122      Medications: Scheduled Meds: . allopurinol  100 mg Oral Daily  . [START ON 04/24/2020] apixaban  2.5 mg Oral BID  . diltiazem  120 mg Oral Daily  . feeding supplement (GLUCERNA SHAKE)  237 mL Oral TID BM  . ferrous sulfate  325 mg Oral Q breakfast  . furosemide  20 mg Oral BID  . gabapentin  300 mg Oral QHS  . guaiFENesin  1,200 mg Oral BID  . insulin aspart  0-9 Units Subcutaneous TID WC  . lactulose  10 g Oral BID  . levothyroxine  88 mcg Oral Q0600  . metoprolol tartrate  25 mg Oral BID  . pantoprazole  40 mg Oral Daily  . sodium bicarbonate  1,300 mg Oral TID  . sodium chloride flush  3 mL Intravenous Q12H    Continuous Infusions: . sodium chloride Stopped (04/01/20 0906)  . ceFEPime (MAXIPIME) IV    . DAPTOmycin (CUBICIN)  IV     PRN Meds:.sodium chloride, acetaminophen, colchicine, levalbuterol, ondansetron (ZOFRAN) IV, polyethylene glycol, sodium chloride flush, traMADol    Objective: Weight change:   Intake/Output Summary (Last 24 hours) at 04/23/2020 1344 Last data filed at 04/23/2020 0521 Gross per 24 hour  Intake --  Output 625 ml  Net -625 ml   Blood pressure (!) 158/77, pulse (!) 108, temperature 99 F (37.2 C), temperature source Oral, resp. rate 18, height 5' 1.5" (1.562 m), weight 70.3 kg, SpO2 96 %. Temp:  [98.5 F (36.9 C)-99.5 F (37.5 C)] 99 F (37.2 C) (11/24 1233) Pulse Rate:  [79-108] 108 (11/24 1233) Resp:  [16-20] 18 (11/24 1233) BP: (141-175)/(62-105) 158/77 (11/24 1233) SpO2:  [96 %-100 %] 96 % (11/24 1233)  Physical Exam: General: Alert and awake,not in any acute distress. HEENT: anicteric sclera, EOMI CVS regular rate, normal no  mgr Chest: , no wheezing, no respiratory distress, clear anteriorly Abdomen: soft non-distended, nontender Bilateral tender knees  Decubitus ulcer  04/23/2020:      Neuro: nonfocal  CBC:    BMET Recent Labs    04/22/20 0832 04/23/20 0520  NA 142 144  K 3.7 3.8  CL 108 109  CO2 25 25  GLUCOSE 117* 113*  BUN 32* 35*  CREATININE 2.57* 2.42*  CALCIUM 8.5* 8.3*     Liver Panel  Recent Labs    04/22/20 0832 04/23/20 0520  PROT 6.9 6.3*  ALBUMIN 2.6* 2.3*  AST 53* 43*  ALT 17 16  ALKPHOS 221* 200*  BILITOT 0.7 0.7       Sedimentation Rate No results for input(s): ESRSEDRATE in the last 72 hours. C-Reactive Protein No results for input(s): CRP in the last 72 hours.  Micro Results: Recent Results (from the past 720 hour(s))  Respiratory Panel by RT PCR (Flu A&B, Covid) - Nasopharyngeal Swab     Status: None   Collection Time: 03/21/2020  2:57 PM   Specimen: Nasopharyngeal Swab  Result  Value Ref Range Status   SARS Coronavirus 2 by RT PCR NEGATIVE NEGATIVE Final    Comment: (NOTE) SARS-CoV-2 target nucleic acids are NOT DETECTED.  The SARS-CoV-2 RNA is generally detectable in upper respiratoy specimens during the acute phase of infection. The lowest concentration of SARS-CoV-2 viral copies this assay can detect is 131 copies/mL. A negative result does not preclude SARS-Cov-2 infection and should not be used as the sole basis for treatment or other patient management decisions. A negative result may occur with  improper specimen collection/handling, submission of specimen other than nasopharyngeal swab, presence of viral mutation(s) within the areas targeted by this assay, and inadequate number of viral copies (<131 copies/mL). A negative result must be combined with clinical observations, patient history, and epidemiological information. The expected result is Negative.  Fact Sheet for Patients:  PinkCheek.be  Fact Sheet for Healthcare Providers:  GravelBags.it  This test is no t yet approved or cleared by the [place] FDA and  has been authorized for detection and/or diagnosis of SARS-CoV-2 by FDA under an Emergency Use Authorization (EUA). This EUA will remain  in effect (meaning this test can be used) for the duration of the COVID-19 declaration under Section 564(b)(1) of the Act, 21 U.S.C. section 360bbb-3(b)(1), unless the authorization is terminated or revoked sooner.     Influenza A by PCR NEGATIVE NEGATIVE Final   Influenza B by PCR NEGATIVE NEGATIVE Final    Comment: (NOTE) The Xpert Xpress SARS-CoV-2/FLU/RSV assay is intended as an aid in  the diagnosis of influenza from Nasopharyngeal swab specimens and  should not be used as a sole basis for treatment. Nasal washings and  aspirates are unacceptable for Xpert Xpress SARS-CoV-2/FLU/RSV  testing.  Fact Sheet for  Patients: PinkCheek.be  Fact Sheet for Healthcare Providers: GravelBags.it  This test is not yet approved or cleared by the [place] FDA and  has been authorized for detection and/or diagnosis of SARS-CoV-2 by  FDA under an Emergency Use Authorization (EUA). This EUA will remain  in effect (meaning this test can be used) for the duration of the  Covid-19 declaration under Section 564(b)(1) of the Act, 21  U.S.C. section 360bbb-3(b)(1), unless the authorization is  terminated or revoked. Performed at [facility], [place] [address] [place], [place] [address]   MRSA PCR Screening     Status: Abnormal   Collection Time:  04/06/2020  7:44 AM   Specimen: Nasopharyngeal  Result Value Ref Range Status   MRSA by PCR POSITIVE (A) NEGATIVE Final    Comment:        The GeneXpert MRSA Assay (FDA approved for NASAL specimens only), is one component of a comprehensive MRSA colonization surveillance program. It is not intended to diagnose MRSA infection nor to guide or monitor treatment for MRSA infections. RESULT CALLED TO, READ BACK BY AND VERIFIED WITH: DARK,D. RN @1111  ON 11.01.2021 BY COHEN,K Performed at [facility], [place] [address], [place], [place] [address]   Culture, blood (Routine X 2) w Reflex to ID Panel     Status: None   Collection Time: 04/04/20  6:25 AM   Specimen: BLOOD  Result Value Ref Range Status   Specimen Description   Final    BLOOD RIGHT ANTECUBITAL Performed at [place] [address], [place], [place] [address]    Special Requests   Final    BOTTLES DRAWN AEROBIC ONLY Blood Culture adequate volume Performed at [place] [address], [place], [place] [address]    Culture   Final    NO GROWTH 5 DAYS Performed at [facility], [place] [address], [place], [place] [address]    Report Status 04/09/2020 FINAL   Final  Culture, blood (Routine X 2) w Reflex to ID Panel     Status: None   Collection Time: 04/04/20  6:25 AM   Specimen: BLOOD RIGHT HAND  Result Value Ref Range Status   Specimen Description   Final    BLOOD RIGHT HAND Performed at [place] [address], [place], [place] [address]    Special Requests   Final    BOTTLES DRAWN AEROBIC AND ANAEROBIC Blood Culture adequate volume Performed at [place] [address], [place], [place] [address]    Culture   Final    NO GROWTH 5 DAYS Performed at [facility], [place] [address] [place], [place] [address]    Report Status 04/09/2020 FINAL  Final  Culture, Urine     Status: Abnormal   Collection Time: 04/04/20  3:08 PM   Specimen: Urine, Random  Result Value Ref Range Status   Specimen Description   Final    URINE, RANDOM Performed at [place] [address] [place], [place] [address]    Special Requests   Final    NONE Performed at [facility], [place] [address] [place], [place] [address]    Culture (A)  Final    70,000 COLONIES/mL PROVIDENCIA STUARTII >=100,000 COLONIES/mL VANCOMYCIN RESISTANT ENTEROCOCCUS    Report Status 04/06/2020 FINAL  Final   Organism ID, Bacteria PROVIDENCIA STUARTII (A)  Final   Organism ID, Bacteria VANCOMYCIN RESISTANT ENTEROCOCCUS (A)  Final      Susceptibility   Providencia stuartii - MIC*    AMPICILLIN >=32 RESISTANT Resistant     CEFAZOLIN >=64 RESISTANT Resistant     CEFEPIME 0.25 SENSITIVE Sensitive     CEFTRIAXONE >=64 RESISTANT Resistant     CIPROFLOXACIN >=4 RESISTANT Resistant     GENTAMICIN RESISTANT Resistant     IMIPENEM 2 SENSITIVE Sensitive     NITROFURANTOIN 128 RESISTANT Resistant     TRIMETH/SULFA 160 RESISTANT Resistant     AMPICILLIN/SULBACTAM >=32 RESISTANT Resistant     PIP/TAZO <=4 SENSITIVE Sensitive     * 70,000 COLONIES/mL PROVIDENCIA STUARTII   Vancomycin  resistant  enterococcus - MIC*    AMPICILLIN <=2 SENSITIVE Sensitive     NITROFURANTOIN <=16 SENSITIVE Sensitive     VANCOMYCIN >=32 RESISTANT Resistant     LINEZOLID 2 SENSITIVE Sensitive     * >=100,000 COLONIES/mL VANCOMYCIN RESISTANT ENTEROCOCCUS  Resp Panel by RT-PCR (Flu A&B, Covid) Nasopharyngeal Swab     Status: None   Collection Time: 04/18/20 12:21 PM   Specimen: Nasopharyngeal Swab; Nasopharyngeal(NP) swabs in vial transport medium  Result Value Ref Range Status   SARS Coronavirus 2 by RT PCR NEGATIVE NEGATIVE Final    Comment: (NOTE) SARS-CoV-2 target nucleic acids are NOT DETECTED.  The SARS-CoV-2 RNA is generally detectable in upper respiratory specimens during the acute phase of infection. The lowest concentration of SARS-CoV-2 viral copies this assay can detect is 138 copies/mL. A negative result does not preclude SARS-Cov-2 infection and should not be used as the sole basis for treatment or other patient management decisions. A negative result may occur with  improper specimen collection/handling, submission of specimen other than nasopharyngeal swab, presence of viral mutation(s) within the areas targeted by this assay, and inadequate number of viral copies(<138 copies/mL). A negative result must be combined with clinical observations, patient history, and epidemiological information. The expected result is Negative.  Fact Sheet for Patients:  EntrepreneurPulse.com.au  Fact Sheet for Healthcare Providers:  IncredibleEmployment.be  This test is no t yet approved or cleared by the [place] FDA and  has been authorized for detection and/or diagnosis of SARS-CoV-2 by FDA under an Emergency Use Authorization (EUA). This EUA will remain  in effect (meaning this test can be used) for the duration of the COVID-19 declaration under Section 564(b)(1) of the Act, 21 U.S.C.section 360bbb-3(b)(1), unless the authorization is terminated  or  revoked sooner.       Influenza A by PCR NEGATIVE NEGATIVE Final   Influenza B by PCR NEGATIVE NEGATIVE Final    Comment: (NOTE) The Xpert Xpress SARS-CoV-2/FLU/RSV plus assay is intended as an aid in the diagnosis of influenza from Nasopharyngeal swab specimens and should not be used as a sole basis for treatment. Nasal washings and aspirates are unacceptable for Xpert Xpress SARS-CoV-2/FLU/RSV testing.  Fact Sheet for Patients: EntrepreneurPulse.com.au  Fact Sheet for Healthcare Providers: IncredibleEmployment.be  This test is not yet approved or cleared by the [place] FDA and has been authorized for detection and/or diagnosis of SARS-CoV-2 by FDA under an Emergency Use Authorization (EUA). This EUA will remain in effect (meaning this test can be used) for the duration of the COVID-19 declaration under Section 564(b)(1) of the Act, 21 U.S.C. section 360bbb-3(b)(1), unless the authorization is terminated or revoked.  Performed at [facility], [place] [address], [place], [place] [address]   Aerobic/Anaerobic Culture (surgical/deep wound)     Status: None (Preliminary result)   Collection Time: 04/22/20  4:10 PM   Specimen: Abscess  Result Value Ref Range Status   Specimen Description   Final    ABSCESS L4/5 DISC ASPIRATION Performed at [place] [address], [place], [place] [address]    Special Requests   Final    Normal Performed at [facility], [place] [address], [place], [place] [address]    Gram Stain NO WBC SEEN NO ORGANISMS SEEN   Final   Culture   Final    NO GROWTH < 12 HOURS Performed at [facility], [place] [address], [place], [place] [address]    Report Status  PENDING  Incomplete    Studies/Results: IR LUMBAR DISC ASPIRATION W/IMG GUIDE  Result Date: 04/23/2020 INDICATION: 74 year old female with suspected L4-L5 discitis osteomyelitis. She  presents for disc aspiration. EXAM: Disc aspiration under fluoroscopy MEDICATIONS: The patient is currently admitted to the hospital and receiving intravenous antibiotics. The antibiotics were administered within an appropriate time frame prior to the initiation of the procedure. ANESTHESIA/SEDATION: Fentanyl 50 mcg IV; Versed 1 mg IV Moderate Sedation Time:  10 The patient was continuously monitored during the procedure by the interventional radiology nurse under my direct supervision. COMPLICATIONS: None immediate. PROCEDURE: Informed written consent was obtained from the patient after a thorough discussion of the procedural risks, benefits and alternatives. All questions were addressed. Maximal Sterile Barrier Technique was utilized including caps, mask, sterile gowns, sterile gloves, sterile drape, hand hygiene and skin antiseptic. A timeout was performed prior to the initiation of the procedure. The L4-L5 disc space was identified fluoroscopically. A suitable skin entry site was selected and marked. The overlying skin was sterilely prepped and draped in the standard fashion using chlorhexidine skin prep. Local anesthesia was attained by infiltration with 1% lidocaine. A small dermatotomy was made. Under fluoroscopic guidance, an 18 gauge trocar needle was carefully advanced into the disc space. Aspiration was performed yielding several mL of turbid serosanguineous fluid. This was reserved and sent for Gram stain and culture. The needle was removed.  The patient tolerated the procedure well. IMPRESSION: Successful L4-L5 disc aspiration yielding 2 mL turbid serosanguineous fluid. Electronically Signed   By: Jacqulynn Cadet M.D.   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   Diskitis:  Daptomycin and cefepime  Follow-up culture data  We'll plan on 6-week course of therapy  Bilateral knee pain: on colchicine if does not improve would ask orthopedics to aspirate joint for crystals, cell count, and culture   Dr. Juleen China is available for questions this weekend.     LOS: 25 days   Alcide Evener 04/23/2020, 1:44 PM

## 2020-04-23 NOTE — Progress Notes (Signed)
PROGRESS NOTE    Sarah Colon  VEL:[id number] DOB: 11-Jan-1946 DOA: 03/10/2020 PCP: Patient, No Pcp Per   Chief Complaint  Patient presents with  . Cough  . Shortness of Breath    Brief Narrative:  The patient is Sarah Colon 74 year old African-American female with Sarah Colon past medical history significant for but not limited to diabetes mellitus type 2, diastolic CHF, hypertension, paroxysmal atrial fibrillation, chronic kidney disease stage IV with Sarah Colon baseline creatinine of 1.5-1.8 as well as other comorbidities who presented to the ED with shortness of breath, chest pain, fatigue over the last week. She also reported Sarah Colon poor appetite. She is found to have Sarah Colon hemoglobin of 3 on admission. There are no reports of blood in her stool or dark or tarry stools. Fecal occult test in the ED was positive. GI was consulted and she was admitted to the hospitalist service. She normally lives in SNF is nonambulatory and in the wheelchair. She underwent EGD and colonoscopy without evidence of any bleeding. She had multiple polyp removal from her colonoscopy today. Her prep was poor. GI recommending resuming Xarelto 04/03/20 and she received another dose of diuresis yesterday given her volume overload. Her Xarelto is now resumed today but this evening on 04/04/2019 when she spiked Sarah Colon temperature and she does have Sarah Colon leukocytosis which is chronic but we will panculture to rule out infection.  She is complaining of some abdominal pain so we will get Sarah Colon CT of the abdomen pelvis. Chest x-ray shows possible pneumonia so she was started on Unasyn and SLP was consulted given the location being the Right Lung Base.  Patient was found to have Sarah Colon UTI with Sarah Colon.  She completed course of IV antibiotics.    Repeat CT scan showed persistent mild perirectal and presacral fat stranding of unclear etiology and presacral rectal findings associate with cortical erosions, sclerosis and thinning of the S1  vertebral body. As well as the urinary bladder that was thickened circumferentially.   GI feels that the CT findings are in the setting of fecal impaction and due to stool inflammation from the fecal impaction. They feel that her anemia worsens and she will need Sarah Colon flex sig with enemas for prep prior to discharge to reevaluate the rectum after resolution of the fecal impaction.   She continued to have worsening renal function. Nephrology feels that Contrast from last CT scan caused her AKI and GI has signed off and feel as if her Abdominal Pain is multifactorial. They are going to hold off on Flex Sigmoidoscopy.  Renal function is improving.  MRI of sacrum was obtained due to abnormal CT scan which shows findings concerning for discitis/osteomyelitis.  Infectious disease was consulted.  Plan is for disc aspiration by interventional radiology.  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  ESR 37.   Infectious disease recommending dapto/cefepime x6 weeks  Acute renal failure on chronic kidney disease stage IV   Volume overload  Acute on Chronic Diastolic HF Baseline creatinine approximately 2.  Acute renal failure most likely related to contrast nephropathy. Creatinine peaked at 4.86.  Patient was seen by nephrology.  Placed on low-dose diuretics.  Renal function has been improving.  Creatinine noted to be 2.42 today.  Continue to monitor  Symptomatic anemia, concern for GI bleed  Iron Def Anemia Hb on 03/05/2020 was 3.1. S/p 3 units pRBC  Labs c/w iron def anemia ->start iron supplementation, follow outpatient  On xarelto prior to admission  Upper endoscopy with normal mucosa in stomach (biopsied). Normal duodenal bulb, first portion of duodenum and second portion of duodenum.  Colonoscopy with inadequate prep - perianal skin tags, three 2-5 mm polyps removed with hot snare, six 3-8 mm polyps removed with hot snare, three 5-9 mm polyps removed with hot snare Follow pathology -- tubular adenoma, no high grade dysplasia or carcinoma (11/3). 11/1, benign gastric mucosa, negative for H pylori. Needs repeat colonoscopy in 6 months due to poor prep  Unable to resume Xarelto due to renal function - Eliquis started at 2.5 mg twice daily. CT abdomen and pelvis was done which showed focal rectal wall thickening, acute cellular fluid as well as circumferential thickened bladder. GI was reconsulted and patient given Sarah Colon soapsuds enema due to concerns for fecal impaction.  Gastroenterology signed off.  No evidence for overt bleeding.  Hemoglobin has been stable.  Early Satiety/Abdominal DiscomfortDysphagia/fecal impaction Could be related to volume overload.  Patient underwent extensive GI work-up as mentioned above.  Patient continues to have pain in her abdomen especially after she eats.  There was some concern for biliary colic.  Imaging study showed large peripherally calcified gallstone within gallbladder lumen.  Outpatient follow-up with general surgery.  Underwent barium swallow as well which did not pass  through the distal esophagus but the patient did undergo EGD during this hospitalization which did not show any abnormality in the esophagus.   Patient was seen in consultation by GI who felt patient had Everardo Voris component of fecal impaction causing her abdominal pain. Soapsuds enema was ordered early on during the hospitalization. Patient with complaints of diffuse abdominal pain and initially noted not to have had Anetria Harwick bowel movement in several days. Patient status post enema and having bowel movementswith clinical improvement with abdominal pain. Continue lactulose.  Abdominal discomfort thought to be multifactorial, biliary colic, constipation.  No further work-up recommended by gastroenterology.  Continue PPI.  Outpatient follow-up with gastroenterology.  Sarah stuartii and VRE UTI Patient noted to have had Sarah Colon fever with Sarah Colon leukocytosis. Fever likely secondary from UTI.  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 Status post full courseIV antibiotics.   Liver cirrhosis New diagnosis. MRI of abdomen was degraded exam however no concerning lesions noted repeat MRI recommended which can be done in the outpatient setting. Outpatient follow-up with GI.  Acute hepatitis panel showed equivocal results for hepatitis Alylah Blakney antibody.  Will need to be repeated in the outpatient setting.    Acute metabolic encephalopathy Likely secondary to symptomatic profound anemia. Ammonia levels within normal limits. Clinical improvement and likely at baseline.  It looks like she was started on rifaximin for possible hepatic encephalopathy  which was not the case.  Rifaximin discontinued.  Continue lactulose for constipation.  Metabolic acidosis Likely secondary to acute kidney injury. Remains on oral sodium bicarbonate.  Stable.  Hyponatremia Resolved  Potassium abnormalities Was initially hyperkalemic which improved.  Then became hypokalemic.  Potassium level stable.    Hypothyroidism Continue Synthroid.  Well-controlled diabetes mellitus type 2 Hemoglobin A1c 5.6.Sliding scale insulin.  CBGs are reasonably well controlled.  Low levels this morning likely due to poor oral intake.  Paroxysmal atrial fibrillation Continue Cardizem and Lopressor for rate control. Hemoglobin stable. Xarelto not resumed due to renal function.  Currently on Eliquis twice daily but has been held due to need for procedure by interventional radiology.   Leukocytosis Reason unclear.  Thought to be reactive.  Now she is noted to have possible discitis.    Right upper extremity edema Follow RUE [place] - negative for DVT  History of gout with the bilateral knee pain Resume allopurinol.  colchicine x1.  -> difficult situation with aki on CKD and infection above limiting ability to use steroids.   Consider aspiration if continued pain  Sacral ulcer, POA Pressure Injury 04/08/2020 Sacrum Medial Stage III -  Full thickness tissue loss. Subcutaneous fat may be visible but bone, tendon or muscle are NOT exposed. (Active)  04/16/2020 1149  Location: Sacrum  Location Orientation: Medial  Staging: Stage III -  Full thickness tissue loss. Subcutaneous fat may be visible but bone, tendon or muscle are NOT exposed.  Wound Description (Comments):   Present on Admission: Yes   GOC appreciate palliative assistance, See 11/15 note  DVT prophylaxis: eliquis Code Status: DNR Family Communication: none at bedside Disposition:   Status is: Inpatient  Remains inpatient appropriate because:Inpatient level of care appropriate due to severity of illness   Dispo:  Patient From: Home  Planned Disposition: [place]  Expected discharge date: 04/25/20  Medically stable for discharge: No   Consultants:   GI  Palliative  ID  Nephrology  Procedures:  IR aspiration 11/23  UE [place] Summary:  Right:  No evidence of deep vein thrombosis in the upper extremity. No  evidence of  superficial vein thrombosis in the upper extremity.  Left:  No evidence of thrombosis in the subclavian.   Echo IMPRESSIONS  1. Left ventricular ejection fraction, by estimation, is 65 to 70%. The  left ventricle has normal function. The left ventricle has no regional  wall motion abnormalities. There is mild concentric left ventricular  hypertrophy. Left ventricular diastolic  parameters are consistent with Grade I diastolic dysfunction (impaired  relaxation). Elevated left ventricular end-diastolic pressure.  2. Right ventricular systolic function is normal. The right ventricular  size is normal. There is moderately elevated pulmonary artery systolic  pressure. The estimated right ventricular systolic pressure is 23.7 mmHg.  3. The mitral valve is normal in structure. Mild mitral valve  regurgitation. No evidence of mitral stenosis. Moderate mitral annular  calcification.  4. Tricuspid valve regurgitation is mild to moderate.  5. The aortic valve is tricuspid. Aortic valve regurgitation is not  visualized. Mild to moderate aortic valve sclerosis/calcification is  present, without any evidence of aortic stenosis.  6. The inferior vena cava is dilated in size with >50% respiratory  variability, suggesting right atrial pressure of 8 mmHg.   Colonoscopy Impression - Preparation of the colon was inadequate. - Perianal skin tags found on perianal exam. - Stool in the rectum, in the recto-sigmoid colon, in the sigmoid colon and in the descending  colon. - Three 2 to 5 mm polyps in the cecum, removed with Sarah Colon hot snare. Resected and retrieved. - Six 3 to 8 mm polyps in the ascending colon, removed with Sarah Colon hot snare. Resected and retrieved. - Three 5 to 9 mm polyps in the transverse colon, removed with Lorenzo Arscott hot snare. Resected and Retrieved. Recommendation - Return patient to hospital ward for ongoing care. - Soft diet. - Continue present medications. - Await pathology  results. - Repeat colonoscopy in 6 months because the bowel preparation was suboptimal. - Return to GI office in 2 months.  EGD Impression - Z-line regular, 38 cm from the incisors. - Normal mucosa was found in the entire stomach. Biopsied. - Normal duodenal bulb, first portion of the duodenum and second portion of the Duodenum. Recommendation - Return patient to hospital ward for ongoing care. - Clear liquid diet. - Continue present medications. - Perform Katherin Ramey colonoscopy tomorrow.  LE [place] Summary:  BILATERAL:  - No evidence of deep vein thrombosis seen in the lower extremities,  bilaterally.  - No evidence of superficial venous thrombosis in the lower extremities,  bilaterally.  -No evidence of popliteal cyst, bilaterally.   Antimicrobials:  Anti-infectives (From admission, onward)   Start     Dose/Rate Route Frequency Ordered Stop   04/23/20 1800  DAPTOmycin (CUBICIN) 500 mg in sodium chloride 0.9 % IVPB        500 mg 220 mL/hr over 30 Minutes Intravenous Every 48 hours 04/23/20 1330     04/23/20 1430  ceFEPIme (MAXIPIME) 2 g in sodium chloride 0.9 % 100 mL IVPB        2 g 200 mL/hr over 30 Minutes Intravenous Every 24 hours 04/23/20 1321     04/19/20 0000  rifaximin (XIFAXAN) 550 MG TABS tablet        550 mg Oral 2 times daily 04/19/20 1311 05/19/20 2359   04/06/20 1430  piperacillin-tazobactam (ZOSYN) IVPB 2.25 g        2.25 g 100 mL/hr over 30 Minutes Intravenous Every 6 hours 04/06/20 1320 04/11/20 2359   04/05/20 1930  meropenem (MERREM) 1 g in sodium chloride 0.9 % 100 mL IVPB  Status:  Discontinued        1 g 200 mL/hr over 30 Minutes Intravenous Every 12 hours 04/05/20 1832 04/06/20 1320   04/04/20 1000  Ampicillin-Sulbactam (UNASYN) 3 g in sodium chloride 0.9 % 100 mL IVPB  Status:  Discontinued        3 g 200 mL/hr over 30 Minutes Intravenous Every 12 hours 04/04/20 0851 04/05/20 1829   03/30/20 1400  rifaximin (XIFAXAN) tablet 550 mg  Status:  Discontinued         550 mg Oral 2 times daily 03/30/20 1136 04/20/20 1122         Subjective: No new complaints, knee pain  Objective: Vitals:   04/22/20 2127 04/23/20 0630 04/23/20 1003 04/23/20 1233  BP: (!) 154/62 (!) 141/105 (!) 161/69 (!) 158/77  Pulse: 88 89 98 (!) 108  Resp: '18 20 16 18  ' Temp: 99.5 F (37.5 C) 99.5 F (37.5 C) 98.7 F (37.1 C) 99 F (37.2 C)  TempSrc: Oral Oral Oral Oral  SpO2: 99% 100% 96% 96%  Weight:      Height:        Intake/Output Summary (Last 24 hours) at 04/23/2020 1855 Last data filed at 04/23/2020 1800 Gross per 24 hour  Intake 340 ml  Output 625 ml  Net -285 ml  Filed Weights   04/19/20 0500 04/20/20 0500 04/21/20 0500  Weight: 69.4 kg 69.5 kg 70.3 kg    Examination:  General exam: Appears calm and comfortable  Respiratory system: Clear to auscultation. Respiratory effort normal. Cardiovascular system: S1 & S2 heard, RRR.  Gastrointestinal system: Abdomen is nondistended, soft and nontender.  Central nervous system: Alert and oriented. No focal neurological deficits. Extremities: mild TTP to bilateral knees Skin: No rashes, lesions or ulcers Psychiatry: Judgement and insight appear normal. Mood & affect appropriate.     Data Reviewed: I have personally reviewed following labs and imaging studies  CBC: Recent Labs  Lab 04/17/20 0650 04/17/20 0650 04/18/20 0509 04/18/20 0509 04/19/20 1133 04/20/20 1107 04/21/20 0437 04/22/20 0832 04/23/20 0520  WBC 15.7*   < > 15.6*   < > 16.4* 16.7* 15.9* 17.7* 19.5*  NEUTROABS 10.5*  --  10.2*  --  11.6* 11.8*  --   --   --   HGB 8.7*   < > 8.2*   < > 8.2* 8.9* 8.3* 8.8* 8.0*  HCT 26.1*   < > 26.2*   < > 25.8* 27.5* 25.8* 26.9* 25.0*  MCV 75.7*   < > 79.2*   < > 77.5* 77.0* 77.0* 76.9* 77.6*  PLT 697*   < > 632*   < > 614* 617* 520* 469* 387   < > = values in this interval not displayed.    Basic Metabolic Panel: Recent Labs  Lab 04/17/20 0650 04/17/20 0650 04/18/20 0509  04/18/20 0509 04/19/20 1133 04/20/20 1107 04/21/20 0437 04/22/20 0832 04/23/20 0520  NA 138   < > 137  137   < > 138 139 142 142 144  K 3.4*   < > 3.2*  3.2*   < > 3.1* 3.3* 4.0 3.7 3.8  CL 105   < > 109  109   < > 109 109 109 108 109  CO2 19*   < > 18*  19*   < > 19* '23 22 25 25  ' GLUCOSE 120*   < > 106*  107*   < > 160* 154* 110* 117* 113*  BUN 38*   < > 33*  36*   < > 35* 34* 35* 32* 35*  CREATININE 3.95*   < > 3.60*  3.58*   < > 3.33* 2.90* 2.62* 2.57* 2.42*  CALCIUM 8.3*   < > 8.3*  8.3*   < > 8.1* 8.2* 8.3* 8.5* 8.3*  MG 1.8  --  1.7  --  1.8 1.4*  --   --   --   PHOS 6.3*  --  5.9*  6.0*  --  5.7* 5.0*  --   --   --    < > = values in this interval not displayed.    GFR: Estimated Creatinine Clearance: 18 mL/min (Cannen Dupras) (by C-G formula based on SCr of 2.42 mg/dL (H)).  Liver Function Tests: Recent Labs  Lab 04/19/20 1133 04/20/20 1107 04/21/20 0437 04/22/20 0832 04/23/20 0520  AST 45* 48* 42* 53* 43*  ALT '15 16 15 17 16  ' ALKPHOS 185* 187* 179* 221* 200*  BILITOT 0.6 0.6 0.7 0.7 0.7  PROT 6.4* 6.7 6.4* 6.9 6.3*  ALBUMIN 2.6* 2.6* 2.5* 2.6* 2.3*    CBG: Recent Labs  Lab 04/22/20 1732 04/22/20 2125 04/23/20 0749 04/23/20 1230 04/23/20 1717  GLUCAP 97 175* 103* 122* 113*     Recent Results (from the past 240 hour(s))  Resp Panel by RT-PCR (  Flu Uzma Hellmer&B, Covid) Nasopharyngeal Swab     Status: None   Collection Time: 04/18/20 12:21 PM   Specimen: Nasopharyngeal Swab; Nasopharyngeal(NP) swabs in vial transport medium  Result Value Ref Range Status   SARS Coronavirus 2 by RT PCR NEGATIVE NEGATIVE Final    Comment: (NOTE) SARS-CoV-2 target nucleic acids are NOT DETECTED.  The SARS-CoV-2 RNA is generally detectable in upper respiratory specimens during the acute phase of infection. The lowest concentration of SARS-CoV-2 viral copies this assay can detect is 138 copies/mL. Kyser Wandel negative result does not preclude SARS-Cov-2 infection and should not be used as the  sole basis for treatment or other patient management decisions. Jenisa Monty negative result may occur with  improper specimen collection/handling, submission of specimen other than nasopharyngeal swab, presence of viral mutation(s) within the areas targeted by this assay, and inadequate number of viral copies(<138 copies/mL). Amelianna Meller negative result must be combined with clinical observations, patient history, and epidemiological information. The expected result is Negative.  Fact Sheet for Patients:  EntrepreneurPulse.com.au  Fact Sheet for Healthcare Providers:  IncredibleEmployment.be  This test is no t yet approved or cleared by the [place] FDA and  has been authorized for detection and/or diagnosis of SARS-CoV-2 by FDA under an Emergency Use Authorization (EUA). This EUA will remain  in effect (meaning this test can be used) for the duration of the COVID-19 declaration under Section 564(b)(1) of the Act, 21 U.S.C.section 360bbb-3(b)(1), unless the authorization is terminated  or revoked sooner.       Influenza Lyndsy Gilberto by PCR NEGATIVE NEGATIVE Final   Influenza B by PCR NEGATIVE NEGATIVE Final    Comment: (NOTE) The Xpert Xpress SARS-CoV-2/FLU/RSV plus assay is intended as an aid in the diagnosis of influenza from Nasopharyngeal swab specimens and should not be used as Colsen Modi sole basis for treatment. Nasal washings and aspirates are unacceptable for Xpert Xpress SARS-CoV-2/FLU/RSV testing.  Fact Sheet for Patients: EntrepreneurPulse.com.au  Fact Sheet for Healthcare Providers: IncredibleEmployment.be  This test is not yet approved or cleared by the [place] FDA and has been authorized for detection and/or diagnosis of SARS-CoV-2 by FDA under an Emergency Use Authorization (EUA). This EUA will remain in effect (meaning this test can be used) for the duration of the COVID-19 declaration under Section 564(b)(1) of the  Act, 21 U.S.C. section 360bbb-3(b)(1), unless the authorization is terminated or revoked.  Performed at [facility], [place] [address] [place], [place] [address]   Aerobic/Anaerobic Culture (surgical/deep wound)     Status: None (Preliminary result)   Collection Time: 04/22/20  4:10 PM   Specimen: Abscess  Result Value Ref Range Status   Specimen Description   Final    ABSCESS L4/5 DISC ASPIRATION Performed at [facility] [address], [place], [place] [address]    Special Requests   Final    Normal Performed at [facility], [place] [address], [place], [place] [address]    Gram Stain NO WBC SEEN NO ORGANISMS SEEN   Final   Culture   Final    NO GROWTH < 12 HOURS Performed at [facility], [place] [address], [place], [place] [address]    Report Status PENDING  Incomplete         Radiology Studies: IR LUMBAR [place] ASPIRATION W/IMG GUIDE  Result Date: 04/23/2020 INDICATION: 74 year old female with suspected L4-L5 discitis osteomyelitis. She presents for disc aspiration. EXAM: Disc aspiration under fluoroscopy MEDICATIONS: The patient is currently admitted to the hospital  and receiving intravenous antibiotics. The antibiotics were administered within an appropriate time frame prior to the initiation of the procedure. ANESTHESIA/SEDATION: Fentanyl 50 mcg IV; Versed 1 mg IV Moderate Sedation Time:  10 The patient was continuously monitored during the procedure by the interventional radiology nurse under my direct supervision. COMPLICATIONS: None immediate. PROCEDURE: Informed written consent was obtained from the patient after Shaunn Tackitt thorough discussion of the procedural risks, benefits and alternatives. All questions were addressed. Maximal Sterile Barrier Technique was utilized including caps, mask, sterile gowns, sterile gloves, sterile drape, hand hygiene and skin antiseptic. Bettyjo Lundblad timeout was performed prior to the initiation of  the procedure. The L4-L5 disc space was identified fluoroscopically. Huber Mathers suitable skin entry site was selected and marked. The overlying skin was sterilely prepped and draped in the standard fashion using chlorhexidine skin prep. Local anesthesia was attained by infiltration with 1% lidocaine. Ahley Bulls small dermatotomy was made. Under fluoroscopic guidance, an 18 gauge trocar needle was carefully advanced into the disc space. Aspiration was performed yielding several mL of turbid serosanguineous fluid. This was reserved and sent for Gram stain and culture. The needle was removed.  The patient tolerated the procedure well. IMPRESSION: Successful L4-L5 disc aspiration yielding 2 mL turbid serosanguineous fluid. Electronically Signed   By: Jacqulynn Cadet M.D.   On: 04/23/2020 08:38        Scheduled Meds: . allopurinol  100 mg Oral Daily  . [START ON 04/24/2020] apixaban  2.5 mg Oral BID  . diltiazem  120 mg Oral Daily  . feeding supplement (GLUCERNA SHAKE)  237 mL Oral TID BM  . ferrous sulfate  325 mg Oral Q breakfast  . furosemide  20 mg Oral BID  . gabapentin  300 mg Oral QHS  . guaiFENesin  1,200 mg Oral BID  . insulin aspart  0-9 Units Subcutaneous TID WC  . lactulose  10 g Oral BID  . levothyroxine  88 mcg Oral Q0600  . metoprolol tartrate  25 mg Oral BID  . pantoprazole  40 mg Oral Daily  . sodium bicarbonate  1,300 mg Oral TID  . sodium chloride flush  3 mL Intravenous Q12H   Continuous Infusions: . sodium chloride Stopped (04/01/20 0906)  . ceFEPime (MAXIPIME) IV Stopped (04/23/20 1555)  . DAPTOmycin (CUBICIN)  IV 500 mg (04/23/20 1756)     LOS: 25 days    Time spent: over 61 min    Fayrene Helper, MD [facility]   To contact the attending provider between 7A-7P or the covering provider during after hours 7P-7A, please log into the web site [email or web address] and access using universal [place] password for that web site. If you do not have the password, please call  the hospital operator.  04/23/2020, 6:55 PM

## 2020-04-24 DIAGNOSIS — D649 Anemia, unspecified: Secondary | ICD-10-CM | POA: Diagnosis not present

## 2020-04-24 LAB — GLUCOSE, CAPILLARY
Glucose-Capillary: 92 mg/dL (ref 70–99)
Glucose-Capillary: 124 mg/dL — ABNORMAL HIGH (ref 70–99)
Glucose-Capillary: 138 mg/dL — ABNORMAL HIGH (ref 70–99)
Glucose-Capillary: 108 mg/dL — ABNORMAL HIGH (ref 70–99)

## 2020-04-24 MED ORDER — DICLOFENAC SODIUM 1 % EX GEL
2.0000 g | Freq: Four times a day (QID) | CUTANEOUS | Status: DC
  Administered 2020-04-24 – 2020-05-03 (×33): 2 g via TOPICAL
  Filled 2020-04-24 (×2): qty 100

## 2020-04-24 MED ORDER — COLCHICINE 0.3 MG HALF TABLET
0.3000 mg | ORAL_TABLET | ORAL | Status: DC
  Administered 2020-04-25: 0.3 mg via ORAL
  Filled 2020-04-24 (×2): qty 1

## 2020-04-24 NOTE — TOC Progression Note (Signed)
Transition of Care [facility]) - Progression Note    Patient Details  Name: Sarah Colon MRN: [id number] Date of Birth: 1945-12-07  Transition [facility] Care [facility]) CM/SW Contact  Ross Ludwig, [place] Phone Number: 04/24/2020, 5:04 PM  Clinical Narrative:    Patient has a bed available at [name], patient needs long term antibiotics and will have a tunnel line.  CSW spoke to [place] at [facility] and per her director of nursing they can accept a tunnel line however it will have to be next week.  CSW updated physician.  Expected Discharge Plan: [place] Barriers to Discharge: Continued Medical Work up  Expected Discharge Plan and Services Expected Discharge Plan: [place]   Discharge Planning Services: CM Consult Post Acute Care Choice: Resumption of Svcs/PTA Provider Living arrangements for the past 2 months: [place] Expected Discharge Date: 04/19/20               DME Arranged: N/A         HH Arranged: NA           Social Determinants of Health (SDOH) Interventions    Readmission Risk Interventions Readmission Risk Prevention Plan 04/14/2020  Transportation Screening Complete  Medication Review Press photographer) Complete  PCP or Specialist appointment within 3-5 days of discharge Complete  HRI or Home Care Consult Complete  SW Recovery Care/Counseling Consult Complete  Palliative Care Screening Not [place] Complete  Some recent data might be hidden

## 2020-04-24 NOTE — Progress Notes (Signed)
PROGRESS NOTE    Sarah Colon  JTT:[id number] DOB: 07/01/1945 DOA: 03/01/2020 PCP: Patient, No Pcp Per   Chief Complaint  Patient presents with  . Cough  . Shortness of Breath    Brief Narrative:  The patient is Sarah Colon 74 year old African-American female with Sarah Colon past medical history significant for but not limited to diabetes mellitus type 2, diastolic CHF, hypertension, paroxysmal atrial fibrillation, chronic kidney disease stage IV with Sarah Colon baseline creatinine of 1.5-1.8 as well as other comorbidities who presented to the ED with shortness of breath, chest pain, fatigue over the last week. She also reported Sarah Colon poor appetite. She is found to have Sarah Colon hemoglobin of 3 on admission. There are no reports of blood in her stool or dark or tarry stools. Fecal occult test in the ED was positive. GI was consulted and she was admitted to the hospitalist service. She normally lives in SNF is nonambulatory and in the wheelchair. She underwent EGD and colonoscopy without evidence of any bleeding. She had multiple polyp removal from her colonoscopy today. Her prep was poor. GI recommending resuming Xarelto 04/03/20 and she received another dose of diuresis yesterday given her volume overload. Her Xarelto is now resumed today but this evening on 04/04/2019 when she spiked Sarah Colon temperature and she does have Sarah Colon leukocytosis which is chronic but we will panculture to rule out infection.  She is complaining of some abdominal pain so we will get Sarah Colon CT of the abdomen pelvis. Chest x-ray shows possible pneumonia so she was started on Unasyn and SLP was consulted given the location being the Right Lung Base.  Patient was found to have Sarah Colon UTI with Providencia and Entercoccus Faecalis.  She completed course of IV antibiotics.    Repeat CT scan showed persistent mild perirectal and presacral fat stranding of unclear etiology and presacral rectal findings associate with cortical erosions, sclerosis and thinning of the S1  vertebral body. As well as the urinary bladder that was thickened circumferentially.   GI feels that the CT findings are in the setting of fecal impaction and due to stool inflammation from the fecal impaction. They feel that her anemia worsens and she will need Sarah Colon flex sig with enemas for prep prior to discharge to reevaluate the rectum after resolution of the fecal impaction.   She continued to have worsening renal function. Nephrology feels that Contrast from last CT scan caused her AKI and GI has signed off and feel as if her Abdominal Pain is multifactorial. They are going to hold off on Flex Sigmoidoscopy.  Renal function is improving.  MRI of sacrum was obtained due to abnormal CT scan which shows findings concerning for discitis/osteomyelitis.  Infectious disease was consulted.  Plan is for disc aspiration by interventional radiology.  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  ESR 37.   Infectious disese recommending dpto/cefepime x6 weeks  cute renl filure on chronic kidney disese stge IV  Volume overlod  cute  on Chronic Distolic HF Bseline cretinine pproximtely 2.  cute renl filure most likely relted to contrst nephropthy. Cretinine peked t 4.86.  Ptient ws seen by nephrology.  Plced on low-dose diuretics.  Renl function hs been improving.  Cretinine noted to be 2.42 yesterdy - lbs pending collection tody.  Continue to monitor  Symptomtic nemi, concern for GI bleed  Iron Def nemi Hb on 03/17/2020 ws 3.1. S/p 3 units pRBC  Lbs c/w iron def nemi ->strt iron supplementtion, follow outptient  On xrelto prior to dmission  Upper endoscopy with norml mucos in stomch (biopsied). Norml duodenl bulb, first portion of duodenum nd second portion of duodenum.  Colonoscopy with indequte prep - perinl skin tgs, three 2-5 mm polyps removed with hot snre, six 3-8 mm polyps removed with hot snre, three 5-9 mm polyps removed with hot snre Follow pthology -- tubulr denom, no high grde dysplsi or crcinom (11/3). 11/1, benign gstric mucos, negtive for H pylori. Needs repet colonoscopy in 6 months due to poor prep  Unble to resume Xrelto due to renl function - Eliquis strted t 2.5 mg twice dily. CT bdomen nd pelvis ws done which showed focl rectl wll thickening, cute cellulr fluid s well s circumferentil thickened bldder. GI ws reconsulted nd ptient given  sopsuds enem due to concerns for fecl impction.  Gstroenterology signed off.  No evidence for overt bleeding.  Hemoglobin hs been stble.  Erly Stiety/bdominl DiscomfortDysphgi/fecl impction Could be relted to volume overlod.  Ptient underwent extensive GI work-up s mentioned bove.  Ptient continues to hve pin in her bdomen especilly fter she ets.  There ws some concern for biliry colic.  Imging study showed lrge peripherlly clcified gllstone within gllbldder lumen.  Outptient follow-up with generl surgery.  Underwent brium swllow s well which  did not pss through the distl esophgus but the ptient did undergo EGD during this hospitliztion which did not show ny bnormlity in the esophgus.   Ptient ws seen in consulttion by GI who felt ptient hd  component of fecl impction cusing her bdominl pin. Sopsuds enem ws ordered erly on during the hospitliztion. Ptient with complints of diffuse bdominl pin nd initilly noted not to hve hd  bowel movement in severl dys. Ptient sttus post enem nd hving bowel movementswith clinicl improvement with bdominl pin. Continue lctulose.  bdominl discomfort thought to be multifctoril, biliry colic, constiption.  No further work-up recommended by gstroenterology.  Continue PPI.  Outptient follow-up with gstroenterology.  Providenci sturtii nd VRE UTI Ptient noted to hve hd  fever with  leukocytosis. Fever likely secondry from UTI.  Urine cultures positive for procidenti sturtii nd VRE. Sttus post full courseIV ntibiotics.   Liver cirrhosis New dignosis. MRI of bdomen ws degrded exm however no concerning lesions noted repet MRI recommended which cn be done in the outptient setting. Outptient follow-up with GI.  cute heptitis pnel showed equivocl results for heptitis  ntibody.  Will need to be repeted in the outptient setting.    cute metbolic encephlopthy Likely secondry to symptomtic profound nemi. mmoni levels within norml limits. Clinicl improvement nd likely t bseline.  It looks like she ws strted on rifximin for possible heptic  encephalopathy which was not the case.  Rifaximin discontinued.  Continue lactulose for constipation.  Metabolic acidosis Likely secondary to acute kidney injury. Remains on oral sodium bicarbonate.  Stable.  Hyponatremia Resolved  Potassium abnormalities Was initially hyperkalemic which improved.  Then became hypokalemic.  Potassium level stable.    Hypothyroidism Continue Synthroid.  Well-controlled diabetes mellitus type 2 Hemoglobin A1c 5.6.Sliding scale insulin.  CBGs are reasonably well controlled.  Low levels this morning likely due to poor oral intake.  Paroxysmal atrial fibrillation Continue Cardizem and Lopressor for rate control. Hemoglobin stable. Xarelto not resumed due to renal function.  Currently on Eliquis twice daily.  Leukocytosis Reason unclear.  Thought to be reactive.  Now she is noted to have possible discitis.    Right upper extremity edema Follow RUE [place] - negative for DVT  History of gout with the bilateral knee pain Resume allopurinol.  colchicine qod, discussed with renal.  -> difficult situation with aki on CKD and infection above limiting ability to use steroids.   Consider aspiration if continued pain  Sacral ulcer, POA Pressure Injury 04/26/2020 Sacrum Medial Stage III -  Full thickness tissue loss. Subcutaneous fat may be visible but bone, tendon or muscle are NOT exposed. (Active)  04/24/2020 1149  Location: Sacrum  Location Orientation: Medial  Staging: Stage III -  Full thickness tissue loss. Subcutaneous fat may be visible but bone, tendon or muscle are NOT exposed.  Wound Description (Comments):   Present on Admission: Yes   GOC appreciate palliative assistance, See 11/15 note  DVT prophylaxis: eliquis Code Status: DNR Family Communication: none at bedside Disposition:   Status is: Inpatient  Remains inpatient appropriate because:Inpatient level of care appropriate due to severity of illness   Dispo:  Patient From: Home  Planned Disposition: [place]  Expected discharge date: 04/25/20  Medically stable for discharge: No   Consultants:   GI  Palliative  ID  Nephrology  Procedures:  IR aspiration 11/23  UE [place] Summary:  Right:  No evidence of deep vein thrombosis in the upper extremity. No evidence of  superficial vein thrombosis in the  upper extremity.  Left:  No evidence of thrombosis in the subclavian.   Echo IMPRESSIONS  1. Left ventricular ejection fraction, by estimation, is 65 to 70%. The  left ventricle has normal function. The left ventricle has no regional  wall motion abnormalities. There is mild concentric left ventricular  hypertrophy. Left ventricular diastolic  parameters are consistent with Grade I diastolic dysfunction (impaired  relaxation). Elevated left ventricular end-diastolic pressure.  2. Right ventricular systolic function is normal. The right ventricular  size is normal. There is moderately elevated pulmonary artery systolic  pressure. The estimated right ventricular systolic pressure is 09.3 mmHg.  3. The mitral valve is normal in structure. Mild mitral valve  regurgitation. No evidence of mitral stenosis. Moderate mitral annular  calcification.  4. Tricuspid valve regurgitation is mild to moderate.  5. The aortic valve is tricuspid. Aortic valve regurgitation is not  visualized. Mild to moderate aortic valve sclerosis/calcification is  present, without any evidence of aortic stenosis.  6. The inferior vena cava is dilated in size with >50% respiratory  variability, suggesting right atrial pressure of 8 mmHg.   Colonoscopy Impression - Preparation of the colon was inadequate. - Perianal skin tags found on perianal exam. - Stool in the rectum, in the recto-sigmoid colon, in the sigmoid colon and in the descending colon. - Three 2 to 5 mm polyps in **Note De-Identified vi Obfusction** the cecum, removed with  hot snre. Resected nd retrieved. - Six 3 to 8 mm polyps in the scending colon, removed with  hot snre. Resected nd retrieved. - Three 5 to 9 mm polyps in the trnsverse colon, removed with  hot snre. Resected nd Retrieved. Recommendtion - Return ptient to hospitl wrd for ongoing cre. - Soft diet. - Continue present medictions. - Awit pthology results. - Repet colonoscopy in 6 months  becuse the bowel preprtion ws suboptiml. - Return to GI office in 2 months.  EGD Impression - Z-line regulr, 38 cm from the incisors. - Norml mucos ws found in the entire stomch. Biopsied. - Norml duodenl bulb, first portion of the duodenum nd second portion of the Duodenum. Recommendtion - Return ptient to hospitl wrd for ongoing cre. - Cler liquid diet. - Continue present medictions. - Perform  colonoscopy tomorrow.  LE [place] Summry:  BILATERAL:  - No evidence of deep vein thrombosis seen in the lower extremities,  bilterlly.  - No evidence of superficil venous thrombosis in the lower extremities,  bilterlly.  -No evidence of poplitel cyst, bilterlly.   Antimicrobils:  Anti-infectives (From dmission, onwrd)   Strt     Dose/Rte Route Frequency Ordered Stop   04/23/20 1800  DAPTOmycin (CUBICIN) 500 mg in sodium chloride 0.9 % IVPB        500 mg 220 mL/hr over 30 Minutes Intrvenous Every 48 hours 04/23/20 1330     04/23/20 1430  ceFEPIme (MAXIPIME) 2 g in sodium chloride 0.9 % 100 mL IVPB        2 g 200 mL/hr over 30 Minutes Intrvenous Every 24 hours 04/23/20 1321     04/19/20 0000  rifximin (XIFAXAN) 550 MG TABS tblet        550 mg Orl 2 times dily 04/19/20 1311 05/19/20 2359   04/06/20 1430  pipercillin-tzobctm (ZOSYN) IVPB 2.25 g        2.25 g 100 mL/hr over 30 Minutes Intrvenous Every 6 hours 04/06/20 1320 04/11/20 2359   04/05/20 1930  meropenem (MERREM) 1 g in sodium chloride 0.9 % 100 mL IVPB  Sttus:  Discontinued        1 g 200 mL/hr over 30 Minutes Intrvenous Every 12 hours 04/05/20 1832 04/06/20 1320   04/04/20 1000  Ampicillin-Sulbctm (UNASYN) 3 g in sodium chloride 0.9 % 100 mL IVPB  Sttus:  Discontinued        3 g 200 mL/hr over 30 Minutes Intrvenous Every 12 hours 04/04/20 0851 04/05/20 1829   03/30/20 1400  rifximin (XIFAXAN) tblet 550 mg  Sttus:  Discontinued        550 mg Orl 2 times dily 03/30/20  1136 04/20/20 1122      Subjective: C/o bilterl knee pin  Objective: Vitls:   04/23/20 1003 04/23/20 1233 04/23/20 2011 04/24/20 0532  BP: (!) 161/69 (!) 158/77 (!) 174/72 (!) 162/92  Pulse: 98 (!) 108 97 85  Resp: _0 Temp: 98.7 F (37.1 C) 99 F (37.2 C) 98.8 F (37.1 C) 99.7 F (37.6 C)  TempSrc: Orl Orl Orl Orl  SpO2: 96% 96% 96% 96%  Weight:      Height:        Intke/Output Summry (Lst 24 hours) t 04/24/2020 1350 Lst dt filed t 04/24/2020 0536 Gross per 24 hour  Intke 558.86 ml  Output 1200 ml  Net -641.14 ml   Filed Weights   04/19/20 0500 04/20/20 0500 04/21/20 0500  Weight:  69.4 kg 69.5 kg 70.3 kg    Examination:  General: No acute distress. Cardiovascular: Heart sounds show Epiphany Seltzer regular rate, and rhythm Lungs: Clear to auscultation bilaterally  Abdomen: Soft, nontender, nondistended Neurological: Alert and oriented 3. Moves all extremities 4. Cranial nerves II through XII grossly intact. Skin: Warm and dry. No rashes or lesions. Extremities: No clubbing or cyanosis. Bilateral LE edema.  Data Reviewed: I have personally reviewed following labs and imaging studies  CBC: Recent Labs  Lab 04/18/20 0509 04/18/20 0509 04/19/20 1133 04/20/20 1107 04/21/20 0437 04/22/20 0832 04/23/20 0520  WBC 15.6*   < > 16.4* 16.7* 15.9* 17.7* 19.5*  NEUTROABS 10.2*  --  11.6* 11.8*  --   --   --   HGB 8.2*   < > 8.2* 8.9* 8.3* 8.8* 8.0*  HCT 26.2*   < > 25.8* 27.5* 25.8* 26.9* 25.0*  MCV 79.2*   < > 77.5* 77.0* 77.0* 76.9* 77.6*  PLT 632*   < > 614* 617* 520* 469* 387   < > = values in this interval not displayed.    Basic Metabolic Panel: Recent Labs  Lab 04/18/20 0509 04/18/20 0509 04/19/20 1133 04/20/20 1107 04/21/20 0437 04/22/20 0832 04/23/20 0520  NA 137  137   < > 138 139 142 142 144  K 3.2*  3.2*   < > 3.1* 3.3* 4.0 3.7 3.8  CL 109  109   < > 109 109 109 108 109  CO2 18*  19*   < > 19* _0 GLUCOSE 106*   107*   < > 160* 154* 110* 117* 113*  BUN 33*  36*   < > 35* 34* 35* 32* 35*  CREATININE 3.60*  3.58*   < > 3.33* 2.90* 2.62* 2.57* 2.42*  CALCIUM 8.3*  8.3*   < > 8.1* 8.2* 8.3* 8.5* 8.3*  MG 1.7  --  1.8 1.4*  --   --   --   PHOS 5.9*  6.0*  --  5.7* 5.0*  --   --   --    < > = values in this interval not displayed.    GFR: Estimated Creatinine Clearance: 18 mL/min (Anyelina Claycomb) (by C-G formula based on SCr of 2.42 mg/dL (H)).  Liver Function Tests: Recent Labs  Lab 04/19/20 1133 04/20/20 1107 04/21/20 0437 04/22/20 0832 04/23/20 0520  AST 45* 48* 42* 53* 43*  ALT _1 ALKPHOS 185* 187* 179* 221* 200*  BILITOT 0.6 0.6 0.7 0.7 0.7  PROT 6.4* 6.7 6.4* 6.9 6.3*  ALBUMIN 2.6* 2.6* 2.5* 2.6* 2.3*    CBG: Recent Labs  Lab 04/23/20 0749 04/23/20 1230 04/23/20 1717 04/24/20 0745 04/24/20 1237  GLUCAP 103* 122* 113* 92 124*     Recent Results (from the past 240 hour(s))  Resp Panel by RT-PCR (Flu Artelia Game&B, Covid) Nasopharyngeal Swab     Status: None   Collection Time: 04/18/20 12:21 PM   Specimen: Nasopharyngeal Swab; Nasopharyngeal(NP) swabs in vial transport medium  Result Value Ref Range Status   SARS Coronavirus 2 by RT PCR NEGATIVE NEGATIVE Final    Comment: (NOTE) SARS-CoV-2 target nucleic acids are NOT DETECTED.  The SARS-CoV-2 RNA is generally detectable in upper respiratory specimens during the acute phase of infection. The lowest concentration of SARS-CoV-2 viral copies this assay can detect is 138 copies/mL. Adelita Hone negative result does not preclude SARS-Cov-2 infection and should not be used as the sole basis for treatment or  other patient management decisions. Oviya Ammar negative result may occur with  improper specimen collection/handling, submission of specimen other than nasopharyngeal swab, presence of viral mutation(s) within the areas targeted by this assay, and inadequate number of viral copies(<138 copies/mL). Kavita Bartl negative result must be combined with clinical  observations, patient history, and epidemiological information. The expected result is Negative.  Fact Sheet for Patients:  EntrepreneurPulse.com.au  Fact Sheet for Healthcare Providers:  IncredibleEmployment.be  This test is no t yet approved or cleared by the [place] FDA and  has been authorized for detection and/or diagnosis of SARS-CoV-2 by FDA under an Emergency Use Authorization (EUA). This EUA will remain  in effect (meaning this test can be used) for the duration of the COVID-19 declaration under Section 564(b)(1) of the Act, 21 U.S.C.section 360bbb-3(b)(1), unless the authorization is terminated  or revoked sooner.       Influenza Jo [place]ooze by PCR NEGATIVE NEGATIVE Final   Influenza B by PCR NEGATIVE NEGATIVE Final    Comment: (NOTE) The Xpert Xpress SARS-CoV-2/FLU/RSV plus assay is intended as an aid in the diagnosis of influenza from Nasopharyngeal swab specimens and should not be used as Vernal Hritz sole basis for treatment. Nasal washings and aspirates are unacceptable for Xpert Xpress SARS-CoV-2/FLU/RSV testing.  Fact Sheet for Patients: EntrepreneurPulse.com.au  Fact Sheet for Healthcare Providers: IncredibleEmployment.be  This test is not yet approved or cleared by the [place] FDA and has been authorized for detection and/or diagnosis of SARS-CoV-2 by FDA under an Emergency Use Authorization (EUA). This EUA will remain in effect (meaning this test can be used) for the duration of the COVID-19 declaration under Section 564(b)(1) of the Act, 21 U.S.C. section 360bbb-3(b)(1), unless the authorization is terminated or revoked.  Performed at [facility], [place] [address], [place], [place] [address]   Aerobic/Anaerobic Culture (surgical/deep wound)     Status: None (Preliminary result)   Collection Time: 04/22/20  4:10 PM   Specimen: Abscess  Result Value Ref Range Status    Specimen Description   Final    ABSCESS L4/5 DISC ASPIRATION Performed at [facility] [address], [place], [place] [address]    Special Requests   Final    Normal Performed at [facility], [place] [address], [place], [place] [address]    Gram Stain NO WBC SEEN NO ORGANISMS SEEN   Final   Culture   Final    NO GROWTH 2 DAYS NO ANAEROBES ISOLATED; CULTURE IN PROGRESS FOR 5 DAYS Performed at [facility] [address], [place], [place] [address]    Report Status PENDING  Incomplete         Radiology Studies: IR LUMBAR [place] ASPIRATION W/IMG GUIDE  Result Date: 04/23/2020 INDICATION: 74 year old female with suspected L4-L5 discitis osteomyelitis. She presents for disc aspiration. EXAM: Disc aspiration under fluoroscopy MEDICATIONS: The patient is currently admitted to the hospital and receiving intravenous antibiotics. The antibiotics were administered within an appropriate time frame prior to the initiation of the procedure. ANESTHESIA/SEDATION: Fentanyl 50 mcg IV; Versed 1 mg IV Moderate Sedation Time:  10 The patient was continuously monitored during the procedure by the interventional radiology nurse under my direct supervision. COMPLICATIONS: None immediate. PROCEDURE: Informed written consent was obtained from the patient after Maleeya Peterkin thorough discussion of the procedural risks, benefits and alternatives. All questions were addressed. Maximal Sterile Barrier Technique was utilized including caps, mask, sterile gowns, sterile gloves, sterile drape, hand hygiene and skin antiseptic. Quantavia Frith timeout was  performed prior to the initiation of the procedure. The L4-L5 disc space was identified fluoroscopically.  suitable skin entry site was selected and marked. The overlying skin was sterilely prepped and draped in the standard fashion using chlorhexidine skin prep. Local anesthesia was attained by infiltration with 1% lidocaine.  small dermatotomy was  made. Under fluoroscopic guidance, an 18 gauge trocar needle was carefully advanced into the disc space. spiration was performed yielding several mL of turbid serosanguineous fluid. This was reserved and sent for Gram stain and culture. The needle was removed.  The patient tolerated the procedure well. IMPRESSION: Successful L4-L5 disc aspiration yielding 2 mL turbid serosanguineous fluid. Electronically Signed   By: Jacqulynn Cadet M.D.   On: 04/23/2020 08:38        Scheduled Meds: . allopurinol  100 mg Oral Daily  . apixaban  2.5 mg Oral BID  . [STRT ON 04/25/2020] colchicine  0.3 mg Oral QODY  . diclofenac Sodium  2 g Topical QID  . diltiazem  120 mg Oral Daily  . feeding supplement (GLUCERN SHKE)  237 mL Oral TID BM  . ferrous sulfate  325 mg Oral Q breakfast  . furosemide  20 mg Oral BID  . gabapentin  300 mg Oral QHS  . guaiFENesin  1,200 mg Oral BID  . insulin aspart  0-9 Units Subcutaneous TID WC  . lactulose  10 g Oral BID  . levothyroxine  88 mcg Oral Q0600  . metoprolol tartrate  25 mg Oral BID  . pantoprazole  40 mg Oral Daily  . sodium bicarbonate  1,300 mg Oral TID  . sodium chloride flush  3 mL Intravenous Q12H   Continuous Infusions: . sodium chloride Stopped (04/01/20 0906)  . ceFEPime (MXIPIME) IV Stopped (04/23/20 1555)  . DPTOmycin (CUBICIN)  IV Stopped (04/23/20 1930)     LOS: 26 days    Time spent: over 19 min    Fayrene Helper, MD [facility]   To contact the attending provider between 7-7P or the covering provider during after hours 7P-7, please log into the web site [email or web address] and access using universal [place] password for that web site. If you do not have the password, please call the hospital operator.  04/24/2020, 1:50 PM

## 2020-04-24 NOTE — Plan of Care (Signed)
  Problem: Health Behavior/Discharge Planning: Goal: Ability to manage health-related needs will improve Outcome: Progressing   Problem: Clinical Measurements: Goal: Ability to maintain clinical measurements within normal limits will improve Outcome: Progressing Goal: Will remain free from infection Outcome: Progressing Goal: Diagnostic test results will improve Outcome: Progressing Goal: Cardiovascular complication will be avoided Outcome: Progressing   Problem: Activity: Goal: Risk for activity intolerance will decrease Outcome: Progressing   Problem: Nutrition: Goal: Adequate nutrition will be maintained Outcome: Progressing   Problem: Coping: Goal: Level of anxiety will decrease Outcome: Progressing   Problem: Elimination: Goal: Will not experience complications related to bowel motility Outcome: Progressing Goal: Will not experience complications related to urinary retention Outcome: Progressing   Problem: Safety: Goal: Ability to remain free from injury will improve Outcome: Progressing   Problem: Skin Integrity: Goal: Risk for impaired skin integrity will decrease Outcome: Progressing

## 2020-04-25 ENCOUNTER — Inpatient Hospital Stay (HOSPITAL_COMMUNITY): Payer: Medicare Other

## 2020-04-25 DIAGNOSIS — M4640 Discitis, unspecified, site unspecified: Secondary | ICD-10-CM | POA: Diagnosis not present

## 2020-04-25 HISTORY — PX: IR US GUIDE VASC ACCESS RIGHT: IMG2390

## 2020-04-25 HISTORY — PX: IR FLUORO GUIDE CV LINE RIGHT: IMG2283

## 2020-04-25 LAB — GLUCOSE, CAPILLARY
Glucose-Capillary: 89 mg/dL (ref 70–99)
Glucose-Capillary: 127 mg/dL — ABNORMAL HIGH (ref 70–99)
Glucose-Capillary: 131 mg/dL — ABNORMAL HIGH (ref 70–99)
Glucose-Capillary: 100 mg/dL — ABNORMAL HIGH (ref 70–99)

## 2020-04-25 MED ORDER — DIPHENHYDRAMINE HCL 12.5 MG/5ML PO ELIX
12.5000 mg | ORAL_SOLUTION | Freq: Three times a day (TID) | ORAL | Status: DC | PRN
  Administered 2020-04-25: 12.5 mg via ORAL
  Filled 2020-04-25 (×2): qty 5

## 2020-04-25 MED ORDER — LIDOCAINE-EPINEPHRINE 1 %-1:100000 IJ SOLN
INTRAMUSCULAR | Status: AC
  Filled 2020-04-25: qty 1

## 2020-04-25 NOTE — Progress Notes (Signed)
PROGRESS NOTE    Sarah Colon  WUJ:[id number] DOB: 04/13/1946 DOA: 03/30/2020 PCP: Patient, No Pcp Per   Chief Complaint  Patient presents with   Cough   Shortness of Breath    Brief Narrative:  The patient is Sarah Colon 74 year old African-American female with Rhen Kawecki past medical history significant for but not limited to diabetes mellitus type 2, diastolic CHF, hypertension, paroxysmal atrial fibrillation, chronic kidney disease stage IV with Alila Sotero baseline creatinine of 1.5-1.8 as well as other comorbidities who presented to the ED with shortness of breath, chest pain, fatigue over the last week. She also reported Sarah Colon poor appetite. She is found to have Sarah Colon hemoglobin of 3 on admission. There are no reports of blood in her stool or dark or tarry stools. Fecal occult test in the ED was positive. GI was consulted and she was admitted to the hospitalist service. She normally lives in SNF is nonambulatory and in the wheelchair. She underwent EGD and colonoscopy without evidence of any bleeding. She had multiple polyp removal from her colonoscopy today. Her prep was poor. GI recommending resuming Xarelto 04/03/20 and she received another dose of diuresis yesterday given her volume overload. Her Xarelto is now resumed today but this evening on 04/04/2019 when she spiked Acquanetta Cabanilla temperature and she does have Sarah Colon leukocytosis which is chronic but we will panculture to rule out infection.  She is complaining of some abdominal pain so we will get Tyge Somers CT of the abdomen pelvis. Chest x-ray shows possible pneumonia so she was started on Unasyn and SLP was consulted given the location being the Right Lung Base.  Patient was found to have Sarah Colon UTI with Providencia and Entercoccus Faecalis.  She completed course of IV antibiotics.    Repeat CT scan showed persistent mild perirectal and presacral fat stranding of unclear etiology and presacral rectal findings associate with cortical erosions, sclerosis and thinning of the S1  vertebral body. As well as the urinary bladder that was thickened circumferentially.   GI feels that the CT findings are in the setting of fecal impaction and due to stool inflammation from the fecal impaction. They feel that her anemia worsens and she will need Sarah Colon flex sig with enemas for prep prior to discharge to reevaluate the rectum after resolution of the fecal impaction.   She continued to have worsening renal function. Nephrology feels that Contrast from last CT scan caused her AKI and GI has signed off and feel as if her Abdominal Pain is multifactorial. They are going to hold off on Flex Sigmoidoscopy.  Renal function is improving.  MRI of sacrum was obtained due to abnormal CT scan which shows findings concerning for discitis/osteomyelitis.  Infectious disease was consulted.  Plan is for disc aspiration by interventional radiology.  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  ESR 37.   Infectious disease recommending dapto/cefepime x6 weeks Plan for tunneled line with her hx of CKD, IR consulted  Acute renal failure on  chronic kidney disease stage IV   Volume overload   Acute on Chronic Diastolic HF Baseline creatinine approximately 2.  Acute renal failure most likely related to contrast nephropathy. Creatinine peaked at 4.86.  Patient was seen by nephrology.  Placed on low-dose diuretics.  Renal function has been improving.  Creatinine noted to be 2.42 yesterday - labs pending collection today, discussed with RN Continue to monitor  Symptomatic anemia, concern for GI bleed   Iron Def Anemia Hb on 03/18/2020 was 3.1. S/p 3 units pRBC  Labs c/w iron def anemia ->start iron supplementation, follow outpatient  On xarelto prior to admission  Upper endoscopy with normal mucosa in stomach (biopsied). Normal duodenal bulb, first portion of duodenum and second portion of duodenum.  Colonoscopy with inadequate prep - perianal skin tags, three 2-5 mm polyps removed with hot snare, six 3-8 mm polyps removed with hot snare, three 5-9 mm polyps removed with hot snare Follow pathology -- tubular adenoma, no high grade dysplasia or carcinoma (11/3). 11/1, benign gastric mucosa, negative for H pylori. Needs repeat colonoscopy in 6 months due to poor prep  Unable to resume Xarelto due to renal function - Eliquis started at 2.5 mg twice daily. CT abdomen and pelvis was done which showed focal rectal wall thickening, acute cellular fluid as well as circumferential thickened bladder. GI was reconsulted and patient given Sarah Colon soapsuds enema due to concerns for fecal impaction.  Gastroenterology signed off.  No evidence for overt bleeding.  Hemoglobin has been stable.  Early Satiety/Abdominal DiscomfortDysphagia/fecal impaction Could be related to volume overload.  Patient underwent extensive GI work-up as mentioned above.  Patient continues to have pain in her abdomen especially after she eats.  There was some concern for biliary colic.  Imaging study showed large peripherally calcified gallstone within gallbladder lumen.   Outpatient follow-up with general surgery.  Underwent barium swallow as well which did not pass through the distal esophagus but the patient did undergo EGD during this hospitalization which did not show any abnormality in the esophagus.   Patient was seen in consultation by GI who felt patient had Paolo Okane component of fecal impaction causing her abdominal pain. Soapsuds enema was ordered early on during the hospitalization. Patient with complaints of diffuse abdominal pain and initially noted not to have had Chloe Miyoshi bowel movement in several days. Patient status post enema and having bowel movementswith clinical improvement with abdominal pain. Continue lactulose.  Abdominal discomfort thought to be multifactorial, biliary colic, constipation.  No further work-up recommended by gastroenterology.  Continue PPI.  Outpatient follow-up with gastroenterology.  Providencia stuartii and VRE UTI Patient noted to have had Janaysia Mcleroy fever with Aviyanna Colbaugh leukocytosis. Fever likely secondary from UTI.  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 Status post full courseIV antibiotics.   Liver cirrhosis New diagnosis. MRI of abdomen was degraded exam however no concerning lesions noted repeat MRI recommended which can be done in the outpatient setting. Outpatient follow-up with GI.  Acute hepatitis panel showed equivocal results for hepatitis Lenae Wherley antibody.  Will need to be repeated in the outpatient setting.    Acute metabolic encephalopathy Likely secondary to symptomatic profound anemia. Ammonia levels within normal limits. Clinical improvement  and likely at baseline.  It looks like she was started on rifaximin for possible hepatic encephalopathy which was not the case.  Rifaximin discontinued.  Continue lactulose for constipation.  Metabolic acidosis Likely secondary to acute kidney injury. Remains on oral sodium bicarbonate.  Stable.  Hyponatremia Resolved  Potassium abnormalities Was initially  hyperkalemic which improved.  Then became hypokalemic.  Potassium level stable.   Hypothyroidism Continue Synthroid.  Well-controlled diabetes mellitus type 2 Hemoglobin A1c 5.6.Sliding scale insulin.  CBGs are reasonably well controlled.  Low levels this morning likely due to poor oral intake.  Paroxysmal atrial fibrillation Continue Cardizem and Lopressor for rate control. Hemoglobin stable. Xarelto not resumed due to renal function.  Currently on Eliquis twice daily.  Leukocytosis Reason unclear.  Thought to be reactive.  Now she is noted to have possible discitis.    Right upper extremity edema Follow RUE [place] - negative for DVT  History of gout with the bilateral knee pain Resume allopurinol.  colchicine qod, discussed with renal.  -> difficult situation with aki on CKD and infection above limiting ability to use steroids.  Seems improved with voltaren and colchine, continue to monitor  Consider aspiration if continued pain  Sacral ulcer, POA Pressure Injury 04/23/2020 Sacrum Medial Stage III -  Full thickness tissue loss. Subcutaneous fat may be visible but bone, tendon or muscle are NOT exposed. (Active)  04/14/2020 1149  Location: Sacrum  Location Orientation: Medial  Staging: Stage III -  Full thickness tissue loss. Subcutaneous fat may be visible but bone, tendon or muscle are NOT exposed.  Wound Description (Comments):   Present on Admission: Yes   GOC appreciate palliative assistance, See 11/15 note  DVT prophylaxis: eliquis Code Status: DNR Family Communication: none at bedside Disposition:   Status is: Inpatient  Remains inpatient appropriate because:Inpatient level of care appropriate due to severity of illness   Dispo:  Patient From: Home  Planned Disposition: [place]  Expected discharge date: 04/29/20  Medically stable for discharge: No   Consultants:   GI  Palliative  ID  Nephrology  Procedures:  IR aspiration  11/23  UE [place] Summary:  Right:  No evidence of deep vein thrombosis in the upper extremity. No evidence of  superficial vein thrombosis in the upper extremity.  Left:  No evidence of thrombosis in the subclavian.   Echo IMPRESSIONS  1. Left ventricular ejection fraction, by estimation, is 65 to 70%. The  left ventricle has normal function. The left ventricle has no regional  wall motion abnormalities. There is mild concentric left ventricular  hypertrophy. Left ventricular diastolic  parameters are consistent with Grade I diastolic dysfunction (impaired  relaxation). Elevated left ventricular end-diastolic pressure.  2. Right ventricular systolic function is normal. The right ventricular  size is normal. There is moderately elevated pulmonary artery systolic  pressure. The estimated right ventricular systolic pressure is 72.5 mmHg.  3. The mitral valve is normal in structure. Mild mitral valve  regurgitation. No evidence of mitral stenosis. Moderate mitral annular  calcification.  4. Tricuspid valve regurgitation is mild to moderate.  5. The aortic valve is tricuspid. Aortic valve regurgitation is not  visualized. Mild to moderate aortic valve sclerosis/calcification is  present, without any evidence of aortic stenosis.  6. The inferior vena cava is dilated in size with >50% respiratory  variability, suggesting right atrial pressure of 8 mmHg.   Colonoscopy Impression - Preparation of the colon was inadequate. - Perianal skin tags found on perianal exam. -  Stool in the rectum, in the recto-sigmoid colon, in the sigmoid colon and in the descending colon. - Three 2 to 5 mm polyps in the cecum, removed with Donnis Phaneuf hot snare. Resected and retrieved. - Six 3 to 8 mm polyps in the ascending colon, removed with Christion Leonhard hot snare. Resected and retrieved. - Three 5 to 9 mm polyps in the transverse colon, removed with Mellina Benison hot snare. Resected and Retrieved. Recommendation - Return patient  to hospital ward for ongoing care. - Soft diet. - Continue present medications. - Await pathology results. - Repeat colonoscopy in 6 months because the bowel preparation was suboptimal. - Return to GI office in 2 months.  EGD Impression - Z-line regular, 38 cm from the incisors. - Normal mucosa was found in the entire stomach. Biopsied. - Normal duodenal bulb, first portion of the duodenum and second portion of the Duodenum. Recommendation - Return patient to hospital ward for ongoing care. - Clear liquid diet. - Continue present medications. - Perform Demont Linford colonoscopy tomorrow.  LE [place] Summary:  BILATERAL:  - No evidence of deep vein thrombosis seen in the lower extremities,  bilaterally.  - No evidence of superficial venous thrombosis in the lower extremities,  bilaterally.  -No evidence of popliteal cyst, bilaterally.   Antimicrobials:  Anti-infectives (From admission, onward)   Start     Dose/Rate Route Frequency Ordered Stop   04/23/20 1800  DAPTOmycin (CUBICIN) 500 mg in sodium chloride 0.9 % IVPB        500 mg 220 mL/hr over 30 Minutes Intravenous Every 48 hours 04/23/20 1330     04/23/20 1430  ceFEPIme (MAXIPIME) 2 g in sodium chloride 0.9 % 100 mL IVPB        2 g 200 mL/hr over 30 Minutes Intravenous Every 24 hours 04/23/20 1321     04/19/20 0000  rifaximin (XIFAXAN) 550 MG TABS tablet        550 mg Oral 2 times daily 04/19/20 1311 05/19/20 2359   04/06/20 1430  piperacillin-tazobactam (ZOSYN) IVPB 2.25 g        2.25 g 100 mL/hr over 30 Minutes Intravenous Every 6 hours 04/06/20 1320 04/11/20 2359   04/05/20 1930  meropenem (MERREM) 1 g in sodium chloride 0.9 % 100 mL IVPB  Status:  Discontinued        1 g 200 mL/hr over 30 Minutes Intravenous Every 12 hours 04/05/20 1832 04/06/20 1320   04/04/20 1000  Ampicillin-Sulbactam (UNASYN) 3 g in sodium chloride 0.9 % 100 mL IVPB  Status:  Discontinued        3 g 200 mL/hr over 30 Minutes Intravenous Every 12 hours  04/04/20 0851 04/05/20 1829   03/30/20 1400  rifaximin (XIFAXAN) tablet 550 mg  Status:  Discontinued        550 mg Oral 2 times daily 03/30/20 1136 04/20/20 1122      Subjective: Knees less painful  Objective: Vitals:   04/24/20 1524 04/24/20 2100 04/25/20 0500 04/25/20 0546  BP: (!) 166/106 (!) 161/65  (!) 147/71  Pulse: (!) 110 79  88  Resp: '18 20  18  ' Temp: 99 F (37.2 C) 98.6 F (37 C)    TempSrc: Oral Oral    SpO2: 95% 95%  91%  Weight:   68.5 kg   Height:        Intake/Output Summary (Last 24 hours) at 04/25/2020 1141 Last data filed at 04/25/2020 1030 Gross per 24 hour  Intake 560 ml  Output 1350 ml  Net -790 ml   Filed Weights   04/20/20 0500 04/21/20 0500 04/25/20 0500  Weight: 69.5 kg 70.3 kg 68.5 kg    Examination:  General: No acute distress. Cardiovascular: Heart sounds show Amaad Byers regular rate, and rhythm. Lungs: Clear to auscultation bilaterally Abdomen: Soft, nontender, nondistended  Neurological: Alert and oriented 3. Moves all extremities 4 . Cranial nerves II through XII grossly intact. Skin: sacral wound not examined today Extremities: knees with less tenderness today  Data Reviewed: I have personally reviewed following labs and imaging studies  CBC: Recent Labs  Lab 04/19/20 1133 04/20/20 1107 04/21/20 0437 04/22/20 0832 04/23/20 0520  WBC 16.4* 16.7* 15.9* 17.7* 19.5*  NEUTROABS 11.6* 11.8*  --   --   --   HGB 8.2* 8.9* 8.3* 8.8* 8.0*  HCT 25.8* 27.5* 25.8* 26.9* 25.0*  MCV 77.5* 77.0* 77.0* 76.9* 77.6*  PLT 614* 617* 520* 469* 122    Basic Metabolic Panel: Recent Labs  Lab 04/19/20 1133 04/20/20 1107 04/21/20 0437 04/22/20 0832 04/23/20 0520  NA 138 139 142 142 144  K 3.1* 3.3* 4.0 3.7 3.8  CL 109 109 109 108 109  CO2 19* '23 22 25 25  ' GLUCOSE 160* 154* 110* 117* 113*  BUN 35* 34* 35* 32* 35*  CREATININE 3.33* 2.90* 2.62* 2.57* 2.42*  CALCIUM 8.1* 8.2* 8.3* 8.5* 8.3*  MG 1.8 1.4*  --   --   --   PHOS 5.7* 5.0*  --    --   --     GFR: Estimated Creatinine Clearance: 17.7 mL/min (Egan Sahlin) (by C-G formula based on SCr of 2.42 mg/dL (H)).  Liver Function Tests: Recent Labs  Lab 04/19/20 1133 04/20/20 1107 04/21/20 0437 04/22/20 0832 04/23/20 0520  AST 45* 48* 42* 53* 43*  ALT '15 16 15 17 16  ' ALKPHOS 185* 187* 179* 221* 200*  BILITOT 0.6 0.6 0.7 0.7 0.7  PROT 6.4* 6.7 6.4* 6.9 6.3*  ALBUMIN 2.6* 2.6* 2.5* 2.6* 2.3*    CBG: Recent Labs  Lab 04/24/20 0745 04/24/20 1237 04/24/20 1831 04/24/20 2128 04/25/20 0733  GLUCAP 92 124* 138* 108* 89     Recent Results (from the past 240 hour(s))  Resp Panel by RT-PCR (Flu Jeniece Hannis&B, Covid) Nasopharyngeal Swab     Status: None   Collection Time: 04/18/20 12:21 PM   Specimen: Nasopharyngeal Swab; Nasopharyngeal(NP) swabs in vial transport medium  Result Value Ref Range Status   SARS Coronavirus 2 by RT PCR NEGATIVE NEGATIVE Final    Comment: (NOTE) SARS-CoV-2 target nucleic acids are NOT DETECTED.  The SARS-CoV-2 RNA is generally detectable in upper respiratory specimens during the acute phase of infection. The lowest concentration of SARS-CoV-2 viral copies this assay can detect is 138 copies/mL. Murdock Jellison negative result does not preclude SARS-Cov-2 infection and should not be used as the sole basis for treatment or other patient management decisions. Makinsey Pepitone negative result may occur with  improper specimen collection/handling, submission of specimen other than nasopharyngeal swab, presence of viral mutation(s) within the areas targeted by this assay, and inadequate number of viral copies(<138 copies/mL). Yelitza Reach negative result must be combined with clinical observations, patient history, and epidemiological information. The expected result is Negative.  Fact Sheet for Patients:  EntrepreneurPulse.com.au  Fact Sheet for Healthcare Providers:  IncredibleEmployment.be  This test is no t yet approved or cleared by the [place]  FDA and  has been authorized for detection and/or diagnosis of SARS-CoV-2 by FDA under an Emergency Use Authorization (EUA). This EUA will  remain  in effect (meaning this test can be used) for the duration of the COVID-19 declaration under Section 564(b)(1) of the Act, 21 U.S.C.section 360bbb-3(b)(1), unless the authorization is terminated  or revoked sooner.       Influenza Kiira Brach by PCR NEGATIVE NEGATIVE Final   Influenza B by PCR NEGATIVE NEGATIVE Final    Comment: (NOTE) The Xpert Xpress SARS-CoV-2/FLU/RSV plus assay is intended as an aid in the diagnosis of influenza from Nasopharyngeal swab specimens and should not be used as Adam Demary sole basis for treatment. Nasal washings and aspirates are unacceptable for Xpert Xpress SARS-CoV-2/FLU/RSV testing.  Fact Sheet for Patients: EntrepreneurPulse.com.au  Fact Sheet for Healthcare Providers: IncredibleEmployment.be  This test is not yet approved or cleared by the [place] FDA and has been authorized for detection and/or diagnosis of SARS-CoV-2 by FDA under an Emergency Use Authorization (EUA). This EUA will remain in effect (meaning this test can be used) for the duration of the COVID-19 declaration under Section 564(b)(1) of the Act, 21 U.S.C. section 360bbb-3(b)(1), unless the authorization is terminated or revoked.  Performed at [facility], [place] [address], [place], [place] [address]   Aerobic/Anaerobic Culture (surgical/deep wound)     Status: None (Preliminary result)   Collection Time: 04/22/20  4:10 PM   Specimen: Abscess  Result Value Ref Range Status   Specimen Description   Final    ABSCESS L4/5 DISC ASPIRATION Performed at [place] [address], [place], [place] [address]    Special Requests   Final    Normal Performed at [facility], [place] [address] [place], [place] [address]    Gram Stain NO WBC SEEN NO  ORGANISMS SEEN   Final   Culture   Final    NO GROWTH 3 DAYS NO ANAEROBES ISOLATED; CULTURE IN PROGRESS FOR 5 DAYS Performed at [place] [address], [place], [place] [address]    Report Status PENDING  Incomplete         Radiology Studies: No results found.      Scheduled Meds:  allopurinol  100 mg Oral Daily   apixaban  2.5 mg Oral BID   colchicine  0.3 mg Oral QODAY   diclofenac Sodium  2 g Topical QID   diltiazem  120 mg Oral Daily   feeding supplement (GLUCERNA SHAKE)  237 mL Oral TID BM   ferrous sulfate  325 mg Oral Q breakfast   furosemide  20 mg Oral BID   gabapentin  300 mg Oral QHS   guaiFENesin  1,200 mg Oral BID   insulin aspart  0-9 Units Subcutaneous TID WC   lactulose  10 g Oral BID   levothyroxine  88 mcg Oral Q0600   metoprolol tartrate  25 mg Oral BID   pantoprazole  40 mg Oral Daily   sodium bicarbonate  1,300 mg Oral TID   sodium chloride flush  3 mL Intravenous Q12H   Continuous Infusions:  sodium chloride Stopped (04/01/20 0906)   ceFEPime (MAXIPIME) IV 2 g (04/24/20 1551)   DAPTOmycin (CUBICIN)  IV Stopped (04/23/20 1930)     LOS: 27 days    Time spent: over 32 min    Fayrene Helper, MD [facility]   To contact the attending provider between 7A-7P or the covering provider during after hours 7P-7A, please log into the web site [email or web address] and access using universal [place] password for that web site. If you do not have  the password, please call the hospital operator.  04/25/2020, 11:41 AM

## 2020-04-26 ENCOUNTER — Inpatient Hospital Stay (HOSPITAL_COMMUNITY): Payer: Medicare Other

## 2020-04-26 DIAGNOSIS — G9341 Metabolic encephalopathy: Secondary | ICD-10-CM | POA: Diagnosis not present

## 2020-04-26 DIAGNOSIS — D649 Anemia, unspecified: Secondary | ICD-10-CM | POA: Diagnosis not present

## 2020-04-26 LAB — URINALYSIS, ROUTINE W REFLEX MICROSCOPIC
Bilirubin Urine: NEGATIVE
Glucose, UA: NEGATIVE mg/dL
Ketones, ur: 5 mg/dL — AB
Nitrite: NEGATIVE
Protein, ur: 100 mg/dL — AB
Specific Gravity, Urine: 1.01 (ref 1.005–1.030)
pH: 6 (ref 5.0–8.0)

## 2020-04-26 LAB — COMPREHENSIVE METABOLIC PANEL
ALT: 13 U/L (ref 0–44)
AST: 38 U/L (ref 15–41)
Albumin: 2.2 g/dL — ABNORMAL LOW (ref 3.5–5.0)
Alkaline Phosphatase: 172 U/L — ABNORMAL HIGH (ref 38–126)
Anion gap: 14 (ref 5–15)
BUN: 32 mg/dL — ABNORMAL HIGH (ref 8–23)
CO2: 26 mmol/L (ref 22–32)
Calcium: 8.3 mg/dL — ABNORMAL LOW (ref 8.9–10.3)
Chloride: 106 mmol/L (ref 98–111)
Creatinine, Ser: 2.08 mg/dL — ABNORMAL HIGH (ref 0.44–1.00)
GFR, Estimated: 24 mL/min — ABNORMAL LOW (ref >60)
Glucose, Bld: 93 mg/dL (ref 70–99)
Potassium: 3.3 mmol/L — ABNORMAL LOW (ref 3.5–5.1)
Sodium: 146 mmol/L — ABNORMAL HIGH (ref 135–145)
Total Bilirubin: 0.8 mg/dL (ref 0.3–1.2)
Total Protein: 6.5 g/dL (ref 6.5–8.1)

## 2020-04-26 LAB — BLOOD GAS, VENOUS
Acid-Base Excess: 5 mmol/L — ABNORMAL HIGH (ref 0.0–2.0)
Bicarbonate: 28.4 mmol/L — ABNORMAL HIGH (ref 20.0–28.0)
O2 Saturation: 67.9 %
Patient temperature: 98.6
pCO2, Ven: 38.6 mmHg — ABNORMAL LOW (ref 44.0–60.0)
pH, Ven: 7.48 — ABNORMAL HIGH (ref 7.250–7.430)
pO2, Ven: 37 mmHg (ref 32.0–45.0)

## 2020-04-26 LAB — CBC WITH DIFFERENTIAL/PLATELET
Abs Immature Granulocytes: 0.09 10*3/uL — ABNORMAL HIGH (ref 0.00–0.07)
Basophils Absolute: 0.1 10*3/uL (ref 0.0–0.1)
Basophils Relative: 1 %
Eosinophils Absolute: 1 10*3/uL — ABNORMAL HIGH (ref 0.0–0.5)
Eosinophils Relative: 6 %
HCT: 26.1 % — ABNORMAL LOW (ref 36.0–46.0)
Hemoglobin: 8.4 g/dL — ABNORMAL LOW (ref 12.0–15.0)
Immature Granulocytes: 1 %
Lymphocytes Relative: 16 %
Lymphs Abs: 2.7 10*3/uL (ref 0.7–4.0)
MCH: 25.2 pg — ABNORMAL LOW (ref 26.0–34.0)
MCHC: 32.2 g/dL (ref 30.0–36.0)
MCV: 78.4 fL — ABNORMAL LOW (ref 80.0–100.0)
Monocytes Absolute: 1 10*3/uL (ref 0.1–1.0)
Monocytes Relative: 6 %
Neutro Abs: 11.4 10*3/uL — ABNORMAL HIGH (ref 1.7–7.7)
Neutrophils Relative %: 70 %
Platelets: 311 10*3/uL (ref 150–400)
RBC: 3.33 MIL/uL — ABNORMAL LOW (ref 3.87–5.11)
RDW: 30.2 % — ABNORMAL HIGH (ref 11.5–15.5)
WBC: 16.3 10*3/uL — ABNORMAL HIGH (ref 4.0–10.5)
nRBC: 0 % (ref 0.0–0.2)

## 2020-04-26 LAB — MAGNESIUM: Magnesium: 1.7 mg/dL (ref 1.7–2.4)

## 2020-04-26 LAB — TSH: TSH: 7.333 u[IU]/mL — ABNORMAL HIGH (ref 0.350–4.500)

## 2020-04-26 LAB — AMMONIA: Ammonia: 21 umol/L (ref 9–35)

## 2020-04-26 LAB — GLUCOSE, CAPILLARY
Glucose-Capillary: 89 mg/dL (ref 70–99)
Glucose-Capillary: 111 mg/dL — ABNORMAL HIGH (ref 70–99)
Glucose-Capillary: 101 mg/dL — ABNORMAL HIGH (ref 70–99)
Glucose-Capillary: 99 mg/dL (ref 70–99)

## 2020-04-26 LAB — PHOSPHORUS: Phosphorus: 4 mg/dL (ref 2.5–4.6)

## 2020-04-26 LAB — T4, FREE: Free T4: 1.34 ng/dL — ABNORMAL HIGH (ref 0.61–1.12)

## 2020-04-26 MED ORDER — SODIUM CHLORIDE 0.9% FLUSH: 10.0000 mL | INTRAVENOUS | Status: DC | PRN

## 2020-04-26 MED ORDER — HYDRALAZINE HCL 20 MG/ML IJ SOLN
10.0000 mg | Freq: Once | INTRAMUSCULAR | Status: AC
  Administered 2020-04-26: 10 mg via INTRAVENOUS
  Filled 2020-04-26: qty 1

## 2020-04-26 MED ORDER — DEXTROSE 5 % IV SOLN: INTRAVENOUS | Status: AC

## 2020-04-26 MED ORDER — PIPERACILLIN-TAZOBACTAM 3.375 G IVPB
3.3750 g | Freq: Three times a day (TID) | INTRAVENOUS | Status: DC
  Administered 2020-04-26 – 2020-04-28 (×6): 3.375 g via INTRAVENOUS
  Filled 2020-04-26 (×6): qty 50

## 2020-04-26 MED ORDER — SODIUM CHLORIDE 0.9% FLUSH
10.0000 mL | Freq: Two times a day (BID) | INTRAVENOUS | Status: DC
  Administered 2020-04-27 – 2020-05-03 (×9): 10 mL

## 2020-04-26 MED ORDER — SODIUM CHLORIDE 0.9 % IV SOLN
1250.0000 mg | INTRAVENOUS | Status: AC
  Administered 2020-04-26: 1250 mg via INTRAVENOUS
  Filled 2020-04-26: qty 12.5

## 2020-04-26 MED ORDER — POTASSIUM CHLORIDE 10 MEQ/100ML IV SOLN
10.0000 meq | Freq: Once | INTRAVENOUS | Status: AC
  Administered 2020-04-26: 10 meq via INTRAVENOUS
  Filled 2020-04-26: qty 100

## 2020-04-26 MED ORDER — CHLORHEXIDINE GLUCONATE CLOTH 2 % EX PADS
6.0000 | MEDICATED_PAD | Freq: Every day | CUTANEOUS | Status: DC
  Administered 2020-04-26 – 2020-05-04 (×9): 6 via TOPICAL

## 2020-04-26 NOTE — Progress Notes (Signed)
Patient was alert and oriented beginning of the shift. This RN found patient to have change in mentation. Patient unable to follow command and non verbal. This RN called rapid response with concerns that patient may have a stroke. RR nurse at the bedside, NP on call notified of change in patient's status.

## 2020-04-26 NOTE — Progress Notes (Addendum)
PROGRESS NOTE    Sarah Colon  KAJ:[id number] DOB: July 05, 1945 DOA: 03/07/2020 PCP: Patient, No Pcp Per   Chief Complaint  Patient presents with  . Cough  . Shortness of Breath    Brief Narrative:  The patient is Sarah Colon 74 year old African-American female with Sarah Colon past medical history significant for but not limited to diabetes mellitus type 2, diastolic CHF, hypertension, paroxysmal atrial fibrillation, chronic kidney disease stage IV with Sarah Colon baseline creatinine of 1.5-1.8 as well as other comorbidities who presented to the ED with shortness of breath, chest pain, fatigue over the last week. She also reported Judit Awad poor appetite. She is found to have Taela Charbonneau hemoglobin of 3 on admission. There are no reports of blood in her stool or dark or tarry stools. Fecal occult test in the ED was positive. GI was consulted and she was admitted to the hospitalist service. She normally lives in SNF is nonambulatory and in the wheelchair. She underwent EGD and colonoscopy without evidence of any bleeding. She had multiple polyp removal from her colonoscopy today. Her prep was poor. GI recommending resuming Xarelto 04/03/20 and she received another dose of diuresis yesterday given her volume overload. Her Xarelto is now resumed today but this evening on 04/04/2019 when she spiked River Mckercher temperature and she does have Drayven Marchena leukocytosis which is chronic but we will panculture to rule out infection.  She is complaining of some abdominal pain so we will get Buryl Bamber CT of the abdomen pelvis. Chest x-ray shows possible pneumonia so she was started on Unasyn and SLP was consulted given the location being the Right Lung Base.  Patient was found to have Curtis Uriarte UTI with Providencia and Entercoccus Faecalis.  She completed course of IV antibiotics.    Repeat CT scan showed persistent mild perirectal and presacral fat stranding of unclear etiology and presacral rectal findings associate with cortical erosions, sclerosis and thinning of the S1  vertebral body. As well as the urinary bladder that was thickened circumferentially.   GI feels that the CT findings are in the setting of fecal impaction and due to stool inflammation from the fecal impaction. They feel that her anemia worsens and she will need Treshawn Allen flex sig with enemas for prep prior to discharge to reevaluate the rectum after resolution of the fecal impaction.   She continued to have worsening renal function. Nephrology feels that Contrast from last CT scan caused her AKI and GI has signed off and feel as if her Abdominal Pain is multifactorial. They are going to hold off on Flex Sigmoidoscopy.  Renal function is improving.  MRI of sacrum was obtained due to abnormal CT scan which shows findings concerning for discitis/osteomyelitis.  Infectious disease was consulted.  Plan is for disc aspiration by interventional radiology.  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 Cefepime, other medications, due to infection?), sudden change in mental status overnight Code stroke, neurology recommending MRI, neuro checks, address medical issues, tele, eliquis MRI without acute abnormality  VBG without hypercarbia, ammonia wnl (low suspicion for hepatic encephalopathy) Follow CXR, UA.  Labs without obvious etiology. 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  ESR 37.   Infectious disease recommending dapto/cefepime x6 weeks -> change cefepime to zosyn, will discuss abx recs with ID given AMS S/p tunneled catheter 11/26  Acute renal failure on chronic kidney disease stage IV  Volume overload  Acute on Chronic Diastolic HF Baseline creatinine approximately 2.  Acute renal failure most likely related to contrast nephropathy. Creatinine peaked at 4.86.  Patient was seen by nephrology.  Placed on low-dose diuretics.  Renal function has been improving.  Creatinine noted to be 2.08 today, improved overall  Continue to monitor  Hypernatremia: mild, start gentle D5, follow  Symptomatic anemia, concern for GI bleed  Iron Def Anemia Hb on 03/07/2020 was 3.1. S/p 3 units pRBC  Labs c/w iron def anemia ->start iron supplementation, follow outpatient  On xarelto prior to admission  Upper endoscopy with normal mucosa in stomach (biopsied). Normal duodenal bulb, first portion of duodenum and second portion of duodenum.  Colonoscopy with inadequate prep - perianal skin tags, three 2-5 mm polyps removed with hot snare, six 3-8 mm polyps removed with hot snare, three 5-9 mm polyps removed with hot snare Follow pathology -- tubular adenoma, no high grade dysplasia or carcinoma (11/3). 11/1, benign gastric mucosa, negative for H pylori. Needs repeat colonoscopy in 6 months due to poor prep  Unable to resume Xarelto due to renal function - Eliquis started at 2.5 mg twice daily. CT abdomen and pelvis was done which showed focal rectal wall thickening, acute cellular fluid as well as circumferential thickened bladder. GI was reconsulted and patient  given Reigna Ruperto soapsuds enema due to concerns for fecal impaction.  Gastroenterology signed off.  No evidence for overt bleeding.  Hemoglobin has been stable.  Early Satiety/Abdominal DiscomfortDysphagia/fecal impaction Could be related to volume overload.  Patient underwent extensive GI work-up as mentioned above.  Patient continues to have pain in her abdomen especially after she eats.  There was some concern for biliary colic.  Imaging study showed large peripherally calcified gallstone within gallbladder lumen.  Outpatient follow-up with general surgery.  Underwent barium swallow as well which did not pass through the distal esophagus but the patient did undergo EGD during this hospitalization which did not show any abnormality in the esophagus.   Patient was seen in consultation by GI who felt patient had Gae Bihl component of fecal impaction causing her abdominal pain. Soapsuds enema was ordered early on during the hospitalization. Patient with complaints of diffuse abdominal pain and initially noted not to have had Sitara Cashwell bowel movement in several days. Patient status post enema and having bowel movementswith clinical improvement with abdominal pain. Continue lactulose.  Abdominal discomfort thought to be multifactorial, biliary colic, constipation.  No further work-up recommended by gastroenterology.  Continue PPI.  Outpatient follow-up with gastroenterology.  Providencia stuartii and VRE UTI Patient noted to have had Kyaire Gruenewald fever with Arish Redner leukocytosis. Fever likely secondary from UTI.  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 Status post full courseIV antibiotics.  Liver cirrhosis New diagnosis. MRI of abdomen was degraded exam however no concerning lesions noted repeat MRI recommended which can be done in the outpatient setting. Outpatient follow-up with GI.  Acute hepatitis panel showed equivocal results for hepatitis Zion Ta antibody.  Will need to be repeated in the outpatient setting.      Metabolic acidosis Likely secondary to acute kidney injury. Remains on oral sodium bicarbonate.  Stable.  Hyponatremia Resolved  Potassium abnormalities Mildly low K today, replace gently, follow  Hypothyroidism Continue Synthroid.  Well-controlled diabetes mellitus type 2 Hemoglobin A1c 5.6.Sliding scale insulin.  CBGs are reasonably well controlled.  Low levels this morning likely due to poor oral intake.  Paroxysmal atrial fibrillation Continue Cardizem and Lopressor for rate control. Hemoglobin stable. Xarelto not resumed due to renal function.  Currently on Eliquis twice daily.  Leukocytosis Reason unclear.  Thought to be reactive.  Now she is noted to have possible discitis.    Right upper extremity edema Follow RUE [place] - negative for DVT  History of gout with the bilateral knee pain Resume allopurinol.  colchicine qod, discussed with renal.  -> difficult situation with aki on CKD and infection above limiting ability to use steroids.  Seems improved with voltaren and colchine, continue to monitor  Consider aspiration if continued pain  Sacral ulcer, POA Pressure Injury 04/01/2020 Sacrum Medial Stage III -  Full thickness tissue loss. Subcutaneous fat may be visible but bone, tendon or muscle are NOT exposed. (Active)  04/14/2020 1149  Location: Sacrum  Location Orientation: Medial  Staging: Stage III -  Full thickness tissue loss. Subcutaneous fat may be visible but bone, tendon or muscle are NOT exposed.  Wound Description (Comments):   Present on Admission: Yes   GOC appreciate palliative assistance, See 11/15 note  DVT prophylaxis: eliquis Code Status: DNR Family Communication: none at bedside Disposition:   Status is: Inpatient  Remains inpatient appropriate because:Inpatient level of care appropriate due to severity of illness   Dispo:  Patient From: Home  Planned Disposition: Skilled Nursing Facility  Expected discharge date:  04/29/20  Medically stable for discharge: No   Consultants:   GI  Palliative  ID  Nephrology  Procedures:  IR aspiration 11/23  UE [place] Summary:  Right:  No evidence of deep vein thrombosis in the upper extremity. No evidence of  superficial vein thrombosis in the upper extremity.  Left:  No evidence of thrombosis in the subclavian.   Echo IMPRESSIONS  1. Left ventricular ejection fraction, by estimation, is 65 to 70%. The  left ventricle has normal function. The left ventricle has no regional  wall motion abnormalities. There is mild concentric left ventricular  hypertrophy. Left ventricular diastolic  parameters are consistent with Grade I diastolic dysfunction (impaired  relaxation). Elevated left ventricular end-diastolic pressure.  2. Right ventricular systolic function is normal. The right ventricular  size is normal. There is moderately elevated pulmonary artery systolic  pressure. The estimated right ventricular systolic pressure is 52.6 mmHg.  3. The mitral valve is normal in structure. Mild mitral valve  regurgitation. No evidence of mitral stenosis. Moderate mitral annular  calcification.  4. Tricuspid valve regurgitation is mild to moderate.  5. The aortic valve is tricuspid. Aortic valve regurgitation is not  visualized. Mild to moderate aortic valve sclerosis/calcification is  present, without any evidence of aortic stenosis.  6. The inferior vena cava is dilated in size with >50% respiratory  variability, suggesting right atrial pressure of 8 mmHg.   Colonoscopy  Impression - Preparation of the colon was inadequate. - Perianal skin tags found on perianal exam. - Stool in the rectum, in the recto-sigmoid colon, in the sigmoid colon and in the descending colon. - Three 2 to 5 mm polyps in the cecum, removed with Sheriann Newmann hot snare. Resected and retrieved. - Six 3 to 8 mm polyps in the ascending colon, removed with Allayna Erlich hot snare. Resected and retrieved. -  Three 5 to 9 mm polyps in the transverse colon, removed with Averey Trompeter hot snare. Resected and Retrieved. Recommendation - Return patient to hospital ward for ongoing care. - Soft diet. - Continue present medications. - Await pathology results. - Repeat colonoscopy in 6 months because the bowel preparation was suboptimal. - Return to GI office in 2 months.  EGD Impression - Z-line regular, 38 cm from the incisors. - Normal mucosa was found in the entire stomach. Biopsied. - Normal duodenal bulb, first portion of the duodenum and second portion of the Duodenum. Recommendation - Return patient to hospital ward for ongoing care. - Clear liquid diet. - Continue present medications. - Perform Manson Luckadoo colonoscopy tomorrow.  LE [place] Summary:  BILATERAL:  - No evidence of deep vein thrombosis seen in the lower extremities,  bilaterally.  - No evidence of superficial venous thrombosis in the lower extremities,  bilaterally.  -No evidence of popliteal cyst, bilaterally.   Antimicrobials:  Anti-infectives (From admission, onward)   Start     Dose/Rate Route Frequency Ordered Stop   04/23/20 1800  DAPTOmycin (CUBICIN) 500 mg in sodium chloride 0.9 % IVPB        500 mg 220 mL/hr over 30 Minutes Intravenous Every 48 hours 04/23/20 1330     04/23/20 1430  ceFEPIme (MAXIPIME) 2 g in sodium chloride 0.9 % 100 mL IVPB        2 g 200 mL/hr over 30 Minutes Intravenous Every 24 hours 04/23/20 1321     04/19/20 0000  rifaximin (XIFAXAN) 550 MG TABS tablet        550 mg Oral 2 times daily 04/19/20 1311 05/19/20 2359   04/06/20 1430  piperacillin-tazobactam (ZOSYN) IVPB 2.25 g        2.25 g 100 mL/hr over 30 Minutes Intravenous Every 6 hours 04/06/20 1320 04/11/20 2359   04/05/20 1930  meropenem (MERREM) 1 g in sodium chloride 0.9 % 100 mL IVPB  Status:  Discontinued        1 g 200 mL/hr over 30 Minutes Intravenous Every 12 hours 04/05/20 1832 04/06/20 1320   04/04/20 1000  Ampicillin-Sulbactam (UNASYN) 3  g in sodium chloride 0.9 % 100 mL IVPB  Status:  Discontinued        3 g 200 mL/hr over 30 Minutes Intravenous Every 12 hours 04/04/20 0851 04/05/20 1829   03/30/20 1400  rifaximin (XIFAXAN) tablet 550 mg  Status:  Discontinued        550 mg Oral 2 times daily 03/30/20 1136 04/20/20 1122      Subjective: unresponsive  Objective: Vitals:   04/26/20 0720 04/26/20 0725 04/26/20 0730 04/26/20 1342  BP: (!) 152/58 (!) 148/64 (!) 152/85 (!) 156/88  Pulse: 82 97  83  Resp: (!) _0 Temp:    98.4 F (36.9 C)  TempSrc:    Oral  SpO2: 100% 99%  98%  Weight:      Height:        Intake/Output Summary (Last 24 hours) at 04/26/2020 1351 Last data filed at 04/26/2020 [phone]  Gross per 24 hour  Intake 220 ml  Output 1000 ml  Net -780 ml   Filed Weights   04/21/20 0500 04/25/20 0500 04/26/20 0600  Weight: 70.3 kg 68.5 kg 66.1 kg    Examination:  General: No acute distress. Cardiovascular: Heart sounds show Ozell Juhasz regular rate, and rhythm Lungs: Clear to auscultation bilaterally Abdomen: Soft, nontender, nondistended  Neurological: lethargic, opens eyes to voice, but unable to speak.  She grabs my hands with both hands bilaterally, but not clearly following commands.  Withdraws from painful stimuli.  Skin: Warm and dry. No rashes or lesions. Extremities: bilateral LE edema   Data Reviewed: I have personally reviewed following labs and imaging studies  CBC: Recent Labs  Lab 04/20/20 1107 04/21/20 0437 04/22/20 0832 04/23/20 0520 04/26/20 0446  WBC 16.7* 15.9* 17.7* 19.5* 16.3*  NEUTROABS 11.8*  --   --   --  11.4*  HGB 8.9* 8.3* 8.8* 8.0* 8.4*  HCT 27.5* 25.8* 26.9* 25.0* 26.1*  MCV 77.0* 77.0* 76.9* 77.6* 78.4*  PLT 617* 520* 469* 387 161    Basic Metabolic Panel: Recent Labs  Lab 04/20/20 1107 04/21/20 0437 04/22/20 0832 04/23/20 0520 04/26/20 0446  NA 139 142 142 144 146*  K 3.3* 4.0 3.7 3.8 3.3*  CL 109 109 108 109 106  CO2 _0 GLUCOSE 154*  110* 117* 113* 93  BUN 34* 35* 32* 35* 32*  CREATININE 2.90* 2.62* 2.57* 2.42* 2.08*  CALCIUM 8.2* 8.3* 8.5* 8.3* 8.3*  MG 1.4*  --   --   --  1.7  PHOS 5.0*  --   --   --  4.0    GFR: Estimated Creatinine Clearance: 20.3 mL/min (Emersyn Wyss) (by C-G formula based on SCr of 2.08 mg/dL (H)).  Liver Function Tests: Recent Labs  Lab 04/20/20 1107 04/21/20 0437 04/22/20 0832 04/23/20 0520 04/26/20 0446  AST 48* 42* 53* 43* 38  ALT _1 ALKPHOS 187* 179* 221* 200* 172*  BILITOT 0.6 0.7 0.7 0.7 0.8  PROT 6.7 6.4* 6.9 6.3* 6.5  ALBUMIN 2.6* 2.5* 2.6* 2.3* 2.2*    CBG: Recent Labs  Lab 04/25/20 1157 04/25/20 1604 04/25/20 2242 04/26/20 0617 04/26/20 0748  GLUCAP 127* 131* 100* 89 111*     Recent Results (from the past 240 hour(s))  Resp Panel by RT-PCR (Flu Andrian Sabala&B, Covid) Nasopharyngeal Swab     Status: None   Collection Time: 04/18/20 12:21 PM   Specimen: Nasopharyngeal Swab; Nasopharyngeal(NP) swabs in vial transport medium  Result Value Ref Range Status   SARS Coronavirus 2 by RT PCR NEGATIVE NEGATIVE Final    Comment: (NOTE) SARS-CoV-2 target nucleic acids are NOT DETECTED.  The SARS-CoV-2 RNA is generally detectable in upper respiratory specimens during the acute phase of infection. The lowest concentration of SARS-CoV-2 viral copies this assay can detect is 138 copies/mL. Travontae Freiberger negative result does not preclude SARS-Cov-2 infection and should not be used as the sole basis for treatment or other patient management decisions. Tyshell Ramberg negative result may occur with  improper specimen collection/handling, submission of specimen other than nasopharyngeal swab, presence of viral mutation(s) within the areas targeted by this assay, and inadequate number of viral copies(<138 copies/mL). Dajuana Palen negative result must be combined with clinical observations, patient history, and epidemiological information. The expected result is Negative.  Fact Sheet for Patients:   EntrepreneurPulse.com.au  Fact Sheet for Healthcare Providers:  IncredibleEmployment.be  This test is no t yet approved or cleared  by the [place] and  has been authorized for detection and/or diagnosis of SARS-CoV-2 by FDA under an Emergency Use Authorization (EUA). This EUA will remain  in effect (meaning this test can be used) for the duration of the COVID-19 declaration under Section 564(b)(1) of the Act, 21 U.S.C.section 360bbb-3(b)(1), unless the authorization is terminated  or revoked sooner.       Influenza Jaelin Devincentis by PCR NEGATIVE NEGATIVE Final   Influenza B by PCR NEGATIVE NEGATIVE Final    Comment: (NOTE) The Xpert Xpress SARS-CoV-2/FLU/RSV plus assay is intended as an aid in the diagnosis of influenza from Nasopharyngeal swab specimens and should not be used as Pinchus Weckwerth sole basis for treatment. Nasal washings and aspirates are unacceptable for Xpert Xpress SARS-CoV-2/FLU/RSV testing.  Fact Sheet for Patients: BloggerCourse.com  Fact Sheet for Healthcare Providers: SeriousBroker.it  This test is not yet approved or cleared by the [place] FDA and has been authorized for detection and/or diagnosis of SARS-CoV-2 by FDA under an Emergency Use Authorization (EUA). This EUA will remain in effect (meaning this test can be used) for the duration of the COVID-19 declaration under Section 564(b)(1) of the Act, 21 U.S.C. section 360bbb-3(b)(1), unless the authorization is terminated or revoked.  Performed at [facility], [address], [place], [place] [address]   Aerobic/Anaerobic Culture (surgical/deep wound)     Status: None (Preliminary result)   Collection Time: 04/22/20  4:10 PM   Specimen: Abscess  Result Value Ref Range Status   Specimen Description   Final    ABSCESS L4/5 DISC ASPIRATION Performed at [facility], [address] [place], [place] [address]    Special Requests   Final    Normal Performed at [facility], [address], [place], [place] [address]    Gram Stain NO WBC SEEN NO ORGANISMS SEEN   Final   Culture   Final    NO GROWTH 4 DAYS NO ANAEROBES ISOLATED; CULTURE IN PROGRESS FOR 5 DAYS Performed at [facility], [address], [place], [place] [address]    Report Status PENDING  Incomplete         Radiology Studies: CT HEAD WO CONTRAST  Result Date: 04/26/2020 CLINICAL DATA:  Mental status change. EXAM: CT HEAD WITHOUT CONTRAST TECHNIQUE: Contiguous axial images were obtained from the base of the skull through the vertex without intravenous contrast. COMPARISON:  CT head August 25, 2015. FINDINGS: Brain: No evidence of acute large vascular territory infarction, hemorrhage, hydrocephalus, extra-axial collection or mass lesion/mass effect. Mild white matter hypoattenuation, likely related to chronic microvascular ischemic disease. Vascular: Calcific atherosclerosis. Skull: No acute fracture.  Hyperostosis frontalis. Sinuses/Orbits: No acute finding.  Visualized sinuses are clear. Other: No mastoid effusions. IMPRESSION: No evidence of acute intracranial abnormality. Electronically Signed   By: Feliberto Harts MD   On: 04/26/2020 07:42   MR BRAIN WO CONTRAST  Result Date: 04/26/2020 CLINICAL DATA:  Delirium. EXAM: MRI HEAD WITHOUT CONTRAST TECHNIQUE: Multiplanar, multiecho pulse sequences of the brain and surrounding structures were obtained without intravenous contrast. COMPARISON:  Same day CT head. FINDINGS: Brain: No acute infarction, hemorrhage, hydrocephalus, extra-axial collection or mass lesion. Mild patchy T2/FLAIR hyperintensities within the white matter, nonspecific but most likely secondary to chronic microvascular ischemic disease. Vascular: Major arterial flow voids are maintained at the skull base. Skull and upper cervical spine: Normal marrow signal.  Sinuses/Orbits: Negative. Other: No mastoid effusions. IMPRESSION: No evidence of acute intracranial abnormality. Electronically  Signed   By: Margaretha Sheffield MD   On: 04/26/2020 13:11   IR Fluoro Guide CV Line Right  Result Date: 04/25/2020 INDICATION: 74 year old female with osteomyelitis discitis EXAM: IR ULTRASOUND GUIDANCE VASC ACCESS RIGHT; IR RIGHT FLUORO GUIDE CV LINE MEDICATIONS: None. ANESTHESIA/SEDATION: None. FLUOROSCOPY TIME:  Fluoroscopy Time: 0 minutes 30 seconds (3 mGy). COMPLICATIONS: None immediate. PROCEDURE: Informed written consent was obtained from the patient after Delaynie Stetzer thorough discussion of the procedural risks, benefits and alternatives. All questions were addressed. Maximal Sterile Barrier Technique was utilized including caps, mask, sterile gowns, sterile gloves, sterile drape, hand hygiene and skin antiseptic. Karnisha Lefebre timeout was performed prior to the initiation of the procedure. The right internal jugular vein was interrogated with ultrasound and found to be widely patent. An image was obtained and stored for the medical record. Local anesthesia was attained by infiltration with 1% lidocaine. Jago Carton small dermatotomy was made. Under real-time sonographic guidance, the vessel was punctured with Riven Mabile 21 gauge micropuncture needle. Using standard technique, the initial micro needle was exchanged over Kayonna Lawniczak 0.018 micro wire for Nashaun Hillmer transitional 4 [place] micro sheath. Tavares Levinson suitable skin entry site was then selected on the anterior chest. Local anesthesia was again attained by infiltration with 1% lidocaine. Sendy Pluta small dermatotomy was made. Novali Vollman 6 [place] dual lumen power injectable central venous catheter was then tunneled from the skin exit site to the dermatotomy overlying the venous access site. Catheter was cut to 21 cm in length. The 4 French micro sheath was then exchanged for Dannya Pitkin peel-away sheath which was advanced into the superior vena cava. The catheter was then advanced through the peel-away sheath and the  peel-away sheath was discarded. The position of the catheter tip was confirmed to be in the upper right atrium on fluoroscopy. An image was obtained and stored for the medical record. The catheter flushes and aspirates well. The catheter was flushed with heparinized saline and capped. The catheter was then secured to the skin with 0 Prolene suture. IMPRESSION: Successful placement of Miliyah Luper 6 [place] dual lumen power injectable tunneled central venous catheter via the right internal jugular vein. Catheter tips are in the upper right atrium and the catheter is ready for immediate use. Electronically Signed   By: Jacqulynn Cadet M.D.   On: 04/25/2020 16:03   IR US Guide Vasc Access Right  Result Date: 04/25/2020 INDICATION: 74 year old female with osteomyelitis discitis EXAM: IR ULTRASOUND GUIDANCE VASC ACCESS RIGHT; IR RIGHT FLUORO GUIDE CV LINE MEDICATIONS: None. ANESTHESIA/SEDATION: None. FLUOROSCOPY TIME:  Fluoroscopy Time: 0 minutes 30 seconds (3 mGy). COMPLICATIONS: None immediate. PROCEDURE: Informed written consent was obtained from the patient after Jesson Foskey thorough discussion of the procedural risks, benefits and alternatives. All questions were addressed. Maximal Sterile Barrier Technique was utilized including caps, mask, sterile gowns, sterile gloves, sterile drape, hand hygiene and skin antiseptic. Hanish Laraia timeout was performed prior to the initiation of the procedure. The right internal jugular vein was interrogated with ultrasound and found to be widely patent. An image was obtained and stored for the medical record. Local anesthesia was attained by infiltration with 1% lidocaine. Fahima Cifelli small dermatotomy was made. Under real-time sonographic guidance, the vessel was punctured with Barett Whidbee 21 gauge micropuncture needle. Using standard technique, the initial micro needle was exchanged over Katelynn Heidler 0.018 micro wire for Braxson Hollingsworth transitional 4 [place] micro sheath. Toneisha Savary suitable skin entry site was then selected on the anterior chest. Local  anesthesia was again attained by infiltration with 1% lidocaine. Kayliah Tindol small dermatotomy was made. Laikyn Gewirtz  6 [place] dual lumen power injectable central venous catheter was then tunneled from the skin exit site to the dermatotomy overlying the venous access site. Catheter was cut to 21 cm in length. The 4 French micro sheath was then exchanged for Mavrik Bynum peel-away sheath which was advanced into the superior vena cava. The catheter was then advanced through the peel-away sheath and the peel-away sheath was discarded. The position of the catheter tip was confirmed to be in the upper right atrium on fluoroscopy. An image was obtained and stored for the medical record. The catheter flushes and aspirates well. The catheter was flushed with heparinized saline and capped. The catheter was then secured to the skin with 0 Prolene suture. IMPRESSION: Successful placement of Fadi Menter 6 [place] dual lumen power injectable tunneled central venous catheter via the right internal jugular vein. Catheter tips are in the upper right atrium and the catheter is ready for immediate use. Electronically Signed   By: Jacqulynn Cadet M.D.   On: 04/25/2020 16:03        Scheduled Meds: . allopurinol  100 mg Oral Daily  . apixaban  2.5 mg Oral BID  . Chlorhexidine Gluconate Cloth  6 each Topical Daily  . colchicine  0.3 mg Oral QODAY  . diclofenac Sodium  2 g Topical QID  . diltiazem  120 mg Oral Daily  . feeding supplement (GLUCERNA SHAKE)  237 mL Oral TID BM  . ferrous sulfate  325 mg Oral Q breakfast  . furosemide  20 mg Oral BID  . gabapentin  300 mg Oral QHS  . guaiFENesin  1,200 mg Oral BID  . insulin aspart  0-9 Units Subcutaneous TID WC  . lactulose  10 g Oral BID  . levothyroxine  88 mcg Oral Q0600  . metoprolol tartrate  25 mg Oral BID  . pantoprazole  40 mg Oral Daily  . sodium bicarbonate  1,300 mg Oral TID  . sodium chloride flush  10-40 mL Intracatheter Q12H  . sodium chloride flush  3 mL Intravenous Q12H   Continuous  Infusions: . sodium chloride Stopped (04/01/20 0906)  . ceFEPime (MAXIPIME) IV 2 g (04/26/20 1341)  . DAPTOmycin (CUBICIN)  IV 500 mg (04/25/20 1827)     LOS: 28 days    Time spent: over 30 min 40 min critical care time for ams   Fayrene Helper, MD [facility]   To contact the attending provider between 7A-7P or the covering provider during after hours 7P-7A, please log into the web site [email or web address] and access using universal Green password for that web site. If you do not have the password, please call the hospital operator.  04/26/2020, 1:51 PM

## 2020-04-26 NOTE — Progress Notes (Signed)
Pt has an order for stat EEG. Called EEG tech at [facility] [phone] and left a message on VM.

## 2020-04-26 NOTE — Plan of Care (Signed)
-  Transferred to [facility] Problem: Health Behavior/Discharge Planning: Goal: Ability to manage health-related needs will improve Outcome: Not Met (add Reason)   Problem: Clinical Measurements: Goal: Ability to maintain clinical measurements within normal limits will improve Outcome: Not Met (add Reason) Goal: Will remain free from infection Outcome: Not Met (add Reason) Goal: Diagnostic test results will improve Outcome: Not Met (add Reason) Goal: Cardiovascular complication will be avoided Outcome: Not Met (add Reason)   Problem: Activity: Goal: Risk for activity intolerance will decrease Outcome: Not Met (add Reason)   Problem: Nutrition: Goal: Adequate nutrition will be maintained Outcome: Not Met (add Reason)   Problem: Coping: Goal: Level of anxiety will decrease Outcome: Not Met (add Reason)   Problem: Elimination: Goal: Will not experience complications related to bowel motility Outcome: Not Met (add Reason) Goal: Will not experience complications related to urinary retention Outcome: Not Met (add Reason)   Problem: Safety: Goal: Ability to remain free from injury will improve Outcome: Not Met (add Reason)   Problem: Skin Integrity: Goal: Risk for impaired skin integrity will decrease Outcome: Not Met (add Reason)

## 2020-04-26 NOTE — Progress Notes (Signed)
Received a call from RR RN for concern regarding change in pt mental status. According to bedside RN she was verbal throughout her shift but became non-responsive sometime after 12am. On assessment, pt opens her eyes to verbal stimuli, she is hypertensive VS otherwise stable. She does not appear to be in immediate distress.  Altered Mental Status/Metabolic encephalopathy - Review of chart reveals pt presented with confusion/AMS. Not sure if this is pt baseline.  - CT of head w/o contrast - CMP, CBC, Ammonia  Hypertension - Hydralazine IV given  Lovey Newcomer, NP [facility] 7p-7a [phone]

## 2020-04-26 NOTE — Progress Notes (Signed)
2230- Short telepohone report given to [facility]. 2240- Report called to [place] [facility], to [place] Rn,complete report given such as assessment, code status, active orders, lab and diagnostics, medication, and VS. Receiving Rn aware that neurologists needs to be informed of the patient's arrival to the transfer hospital.Charge nurse Duffy Rhody facilitated transfers and documents.  [facility] arrived, copy of chart and DNR form given to EMS, as well as the latest VS. IVF and right IJ infusing well and patent.Central monitoring informed of the transfer, dicharged pt with stable Vs not in distress.

## 2020-04-26 NOTE — Consult Note (Signed)
WOC consulted for pressure injuries. Patient seen during this patient admission and wound care orders are present on the chart for the chronic sacral pressure injury. Pressure redistribution mattress orders are in the computer as well for moisture management and pressure redistribution.   Will not consult for this reason.  [place], [place], [place]

## 2020-04-26 NOTE — Progress Notes (Addendum)
Discussed with neurology regarding concern for AMS 2/2 cefepime.  Will plan to load with keppra x 1 dose.  Will try to get EEG, may need to [facility] for [facility].  Neurology will call back.   Called and discussed with daughter.  Plan for transfer to [facility] for EEG and neurology.    Discussed with daughter to update her regarding transfer.

## 2020-04-26 NOTE — Consult Note (Signed)
[facility] Telemedicine Consult   Requesting Provider: Florene Glen Consult Participants: Telestroke nurse, patient care nurse, Dr. Florene Glen, Dr. Doy Mince Location of the provider: [facility] Location of the patient: Inpatient, [phone]  This consult was provided via telemedicine with 2-way video and audio communication. The patient was unable to provide consent for care to be provided in this was due to mental status.    Chief Complaint: AMS  HPI: 74 year old female with medical history significant of V4QV, diastolic grade 1 CHF, HTN, anemia, leukocytosis, PAF and CKD with baseline creatinine around 1.2-1.8 who presented to ED on 10/30 for shortness of breath, chest pain and fatigue for the past week. Stated at that time that she had been sleeping and not doing anything for a week.  Patient remained verbal.  LKW at midnight.  Was noted today to not be verbal at about 0600.  Rapid response called at that time.    LKW: 04/26/2020, 0000 tpa given?: No, Outside time window, on Eliquis IR Thrombectomy? No, Patient nonambulatory at baseline.  Modified Rankin Scale: 5-Severe disability-bedridden, incontinent, needs constant attention Time of teleneurologist evaluation: 1017  Exam: Vitals:   04/26/20 0725 04/26/20 0730  BP: (!) 148/64 (!) 152/85  Pulse: 97   Resp: 19 19  Temp:    SpO2: 99%     General: Obtunded  1A: Level of Consciousness - 2 1B: Ask Month and Age - 2 1C: 'Blink Eyes' & 'Squeeze Hands' - 2 2: Test Horizontal Extraocular Movements - 0 3: Test Visual Fields - 0 4: Test Facial Palsy - 0 5A: Test Left Arm Motor Drift - 2 5B: Test Right Arm Motor Drift - 2 6A: Test Left Leg Motor Drift - 4 6B: Test Right Leg Motor Drift - 4 7: Test Limb Ataxia - 0 8: Test Sensation - 0 9: Test Language/Aphasia- 3 10: Test Dysarthria - 2 11: Test Extinction/Inattention - 0 NIHSS score: 23   Imaging Reviewed: Head CT without contrast-no acute  changes.  Labs reviewed in epic and pertinent values follow: CBG-111 BUN/CR- 32/2.08 AP-172 Na-146   Assessment: 74 year old female with medical history significant of Z5GL, diastolic grade 1 CHF, HTN, anemia, leukocytosis, PAF and CKD with baseline creatinine around 1.2-1.8 who presented to ED on 10/30 for shortness of breath, chest pain and fatigue for the past week. Stated at that time that she had been sleeping and not doing anything for a week.  Patient remained verbal.  LKW at midnight.  Was noted today to not be verbal at about 0600.  Rapid response called at that time.  Current NIHSS of 23.  Patient not focal on examination.  Presentation felt to likely represent a metabolic encephalopathy.  With patient not being a candidate for thrombolytics or thrombectomy, CTA was deferred due to CKD.    Recommendations:  1. MRI of the brain without contrast 2. Frequent neuro checks 3. Continued addressing of medical issues 4. Telemetry 5. Agree with continued Eliquis    This patient is receiving care for possible acute neurological changes. There was 31 minutes of care by this provider at the time of service, including time for direct evaluation via telemedicine, review of medical records, imaging studies and discussion of findings with providers, the patient and/or family.  Alexis Goodell, MD Neurology   If 7pm- 7am, please page neurology on call as listed in [place].  Addendum: MRI of the brain personally reviewed and shows no acute infarct.  Results discussed with Dr. Florene Glen.

## 2020-04-26 NOTE — Progress Notes (Signed)
Rapid Response Event Note   Reason for Call :  Bedside RN called due to AMS.  Initial Focused Assessment:   Patient found lying in bed, unable to follow simple commands. Non-verbal, but alert. Patient vital signs-see flowsheet. Patient on RA. Bedside RN reports last known well time 0000 on 04/26/20, she felt like patient may have had a stroke. Unable to perform full stroke assessment due to patient status. No signs of respiratory distress, pain, or cardiac event. PMH reviewed and significant for GI bleed and Xarelto.   Interventions:   Assessment completed; CBG done, VS taken and placed on RR monitor. EKG completed. [facility] called to bedside and she arrived at [phone]. Hypertensive- Hydralazine 10mg  IV push given per NP orders. Labs per NP. Head CT per NP. RR nurses took patient to CT. Results of labs and CT pending. Patient remained non-verbal which bedside RN stated previously she was talking, but not following commands. Hydralazine was effective at lowering BP.   Plan of Care:   Patient will remain in current unit/room for now. Bedside RN will follow up with MD. Dayshift bedside RN informed at bedside. Dayshift RR-RN also aware.   Event Summary:   MD Notified: YES Call Time: 1146 Arrival Time: 4314 End Time: [place], RN

## 2020-04-26 NOTE — Progress Notes (Signed)
PT Cancellation Note  Patient Details Name: Sarah Colon MRN: [id number] DOB: [birth year]   Cancelled Treatment:    Per chart review pt with medical decline....head CT ordred Pt has been evaluated with rec to return to SNF.    Rica Koyanagi  PTA Acute  Rehabilitation Services Pager      [phone] Office      [phone]

## 2020-04-26 NOTE — Progress Notes (Addendum)
SOAP note  S: pt unresponsive Today's Vitals   04/26/20 0715 04/26/20 0720 04/26/20 0725 04/26/20 0730  BP: 132/64 (!) 152/58 (!) 148/64 (!) 152/85  Pulse: 85 82 97   Resp: 20 (!) 27 19 19   Temp:      TempSrc:      SpO2: 100% 100% 99%   Weight:      Height:      PainSc:       Body mass index is 27.08 kg/m.  NAD, lethargic RRR Unlabored breathing S/nt/nd Lethargic, opens eyes to verbal stimuli, pupils symmetric and responsive to light.  Neuro exam difficult due to AMS, but bilateral upper extremity weakness noted.    Kerrilynn Derenzo/P 74 yo with afib, CKD IV, HF, T2DM and multiple other medical problems with prolonged hospital stay noted to have change in mental status last night.  She's typically Vincent Streater&Ox3, today is lethargic, opens eyes only to verbal stimuli, not speaking.  CT without evidence of acute intracranial abnormality.  Ammonia 21.  Last known normal ~12.  Given sudden mental status change will call code stroke, MRI brain.  VBG, TSH.      Called daughter, no answer this AM

## 2020-04-26 NOTE — Progress Notes (Addendum)
Pharmacy Antibiotic Note  Sarah Colon is a 74 y.o. 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 She is s/p disc aspiration on 11/23. Pharmacy has been consulted for daptomycin dosing.  Today, 04/26/2020: - day #4 abx - afeb, wbc 16.3 - scr trending down 2.08 (crcl ~20) - ID recom. 6 weeks of abx  Plan: - continue Daptomycin 500 mg (~ 8 mg/kg AdjBW) every 48 hours - Cefepime 2 gm IV every 24 hours - CK on 11/28 - CK weekly on Thursdays  - monitor renal function closely  Adden (at 1423): - Pharmacy is consulted to change cefepime to zosyn. - zosyn 3.375 gm IV q8h (infuse over 4 hrs) _____________________________________  Height: 5' 1.5" (156.2 cm) Weight: 66.1 kg (145 lb 11.2 oz) IBW/kg (Calculated) : 48.95  Temp (24hrs), Avg:98.3 F (36.8 C), Min:97.6 F (36.4 C), Max:98.7 F (37.1 C)  Recent Labs  Lab 04/20/20 1107 04/21/20 0437 04/22/20 0832 04/23/20 0520 04/26/20 0446  WBC 16.7* 15.9* 17.7* 19.5* 16.3*  CREATININE 2.90* 2.62* 2.57* 2.42* 2.08*    Estimated Creatinine Clearance: 20.3 mL/min (A) (by C-G formula based on SCr of 2.08 mg/dL (H)).    Allergies  Allergen Reactions  . Codeine Other (See Comments)    Swelling    Antimicrobials this admission:  10/31 Rifaximin >>11/21 11/5 Unasyn >> 11/6 11/6 Meropenem >> 11/7 11/7 Zosyn >> 11/12 11/24 Dapto>> 11/24 Cefepime>>  Microbiology results:  11/5 UCx: 70K Providencia, S to Cef/Zosyn; > 100K VRE 11/23 L4/L5 abscess aspiration: ngtd   Thank you for allowing pharmacy to be a part of this patient's care.  Dia Sitter, PharmD, BCPS 04/26/2020 10:22 AM

## 2020-04-27 ENCOUNTER — Inpatient Hospital Stay (HOSPITAL_COMMUNITY): Payer: Medicare Other

## 2020-04-27 DIAGNOSIS — G40901 Epilepsy, unspecified, not intractable, with status epilepticus: Secondary | ICD-10-CM | POA: Diagnosis not present

## 2020-04-27 DIAGNOSIS — I5032 Chronic diastolic (congestive) heart failure: Secondary | ICD-10-CM | POA: Diagnosis not present

## 2020-04-27 DIAGNOSIS — G9341 Metabolic encephalopathy: Secondary | ICD-10-CM | POA: Diagnosis not present

## 2020-04-27 DIAGNOSIS — E11610 Type 2 diabetes mellitus with diabetic neuropathic arthropathy: Secondary | ICD-10-CM | POA: Diagnosis not present

## 2020-04-27 DIAGNOSIS — R4182 Altered mental status, unspecified: Secondary | ICD-10-CM

## 2020-04-27 DIAGNOSIS — D649 Anemia, unspecified: Secondary | ICD-10-CM | POA: Diagnosis not present

## 2020-04-27 DIAGNOSIS — M4646 Discitis, unspecified, lumbar region: Secondary | ICD-10-CM | POA: Diagnosis not present

## 2020-04-27 LAB — AEROBIC/ANAEROBIC CULTURE W GRAM STAIN (SURGICAL/DEEP WOUND)
Culture: NO GROWTH
Gram Stain: NONE SEEN
Special Requests: NORMAL

## 2020-04-27 LAB — URINE CULTURE: Culture: NO GROWTH

## 2020-04-27 LAB — CBC
HCT: 26.4 % — ABNORMAL LOW (ref 36.0–46.0)
Hemoglobin: 8.2 g/dL — ABNORMAL LOW (ref 12.0–15.0)
MCH: 24.8 pg — ABNORMAL LOW (ref 26.0–34.0)
MCHC: 31.1 g/dL (ref 30.0–36.0)
MCV: 79.8 fL — ABNORMAL LOW (ref 80.0–100.0)
Platelets: 292 10*3/uL (ref 150–400)
RBC: 3.31 MIL/uL — ABNORMAL LOW (ref 3.87–5.11)
RDW: 30.7 % — ABNORMAL HIGH (ref 11.5–15.5)
WBC: 15.2 10*3/uL — ABNORMAL HIGH (ref 4.0–10.5)
nRBC: 0 % (ref 0.0–0.2)

## 2020-04-27 LAB — BASIC METABOLIC PANEL
Anion gap: 17 — ABNORMAL HIGH (ref 5–15)
BUN: 27 mg/dL — ABNORMAL HIGH (ref 8–23)
CO2: 27 mmol/L (ref 22–32)
Calcium: 8.1 mg/dL — ABNORMAL LOW (ref 8.9–10.3)
Chloride: 100 mmol/L (ref 98–111)
Creatinine, Ser: 2.26 mg/dL — ABNORMAL HIGH (ref 0.44–1.00)
GFR, Estimated: 22 mL/min — ABNORMAL LOW (ref >60)
Glucose, Bld: 127 mg/dL — ABNORMAL HIGH (ref 70–99)
Potassium: 3.1 mmol/L — ABNORMAL LOW (ref 3.5–5.1)
Sodium: 144 mmol/L (ref 135–145)

## 2020-04-27 LAB — MAGNESIUM: Magnesium: 1.5 mg/dL — ABNORMAL LOW (ref 1.7–2.4)

## 2020-04-27 LAB — GLUCOSE, CAPILLARY
Glucose-Capillary: 106 mg/dL — ABNORMAL HIGH (ref 70–99)
Glucose-Capillary: 83 mg/dL (ref 70–99)

## 2020-04-27 MED ORDER — LORAZEPAM 2 MG/ML IJ SOLN
1.0000 mg | Freq: Once | INTRAMUSCULAR | Status: AC
  Administered 2020-04-27: 1 mg via INTRAVENOUS

## 2020-04-27 MED ORDER — PANTOPRAZOLE SODIUM 40 MG IV SOLR
40.0000 mg | Freq: Every day | INTRAVENOUS | Status: DC
  Administered 2020-04-27 – 2020-04-28 (×2): 40 mg via INTRAVENOUS
  Filled 2020-04-27 (×2): qty 40

## 2020-04-27 MED ORDER — LORAZEPAM 2 MG/ML IJ SOLN: 2.0000 mg | INTRAMUSCULAR | Status: AC

## 2020-04-27 MED ORDER — LORAZEPAM 2 MG/ML IJ SOLN
INTRAMUSCULAR | Status: AC
  Administered 2020-04-27: 2 mg via INTRAVENOUS
  Filled 2020-04-27: qty 2

## 2020-04-27 MED ORDER — MAGNESIUM SULFATE IN D5W 1-5 GM/100ML-% IV SOLN
1.0000 g | Freq: Once | INTRAVENOUS | Status: AC
  Administered 2020-04-27: 1 g via INTRAVENOUS
  Filled 2020-04-27: qty 100

## 2020-04-27 MED ORDER — VALPROATE SODIUM 500 MG/5ML IV SOLN
2500.0000 mg | INTRAVENOUS | Status: AC
  Administered 2020-04-27: 2500 mg via INTRAVENOUS
  Filled 2020-04-27: qty 25

## 2020-04-27 MED ORDER — HYDRALAZINE HCL 20 MG/ML IJ SOLN: 10.0000 mg | Freq: Once | INTRAMUSCULAR | Status: AC

## 2020-04-27 MED ORDER — POTASSIUM CHLORIDE 10 MEQ/100ML IV SOLN
10.0000 meq | INTRAVENOUS | Status: AC
  Administered 2020-04-27 (×2): 10 meq via INTRAVENOUS
  Filled 2020-04-27 (×2): qty 100

## 2020-04-27 MED ORDER — SODIUM CHLORIDE 0.9 % IV SOLN: 2000.0000 mg | Freq: Two times a day (BID) | INTRAVENOUS | Status: DC

## 2020-04-27 MED ORDER — LEVETIRACETAM IN NACL 1500 MG/100ML IV SOLN
1500.0000 mg | Freq: Two times a day (BID) | INTRAVENOUS | Status: DC
  Administered 2020-04-27: 1500 mg via INTRAVENOUS
  Filled 2020-04-27 (×4): qty 100

## 2020-04-27 MED ORDER — LORAZEPAM 2 MG/ML IJ SOLN
2.0000 mg | Freq: Once | INTRAMUSCULAR | Status: AC
  Administered 2020-04-27: 2 mg via INTRAVENOUS
  Filled 2020-04-27: qty 1

## 2020-04-27 MED ORDER — HYDRALAZINE HCL 20 MG/ML IJ SOLN
INTRAMUSCULAR | Status: AC
  Administered 2020-04-27: 10 mg via INTRAVENOUS
  Filled 2020-04-27: qty 1

## 2020-04-27 NOTE — Progress Notes (Addendum)
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 On evaluation, she remained tremulous and minimally responsive. I ordered 2mg  IV ativan and after administration there was improvement in the EEG, though still with discharges visible and therefore another 1mg  IV ativan was ordered. Following this, the EEG markedly improved and the patient became able to state her and and was much more responsive, consistent with treated status epilepticus.   I have ordered depcon 2500mg  IV x 1 and will start 500mg  BID.   This patient was critically ill and at significant risk of neurological worsening, death and care requires constant monitoring of vital signs, hemodynamics,respiratory and cardiac monitoring, neurological assessment, discussion with family, other specialists and medical decision making of high complexity. I spent 35 minutes of neurocritical care time  in the care of  this patient. This was time spent independent of any time provided by nurse practitioner or PA.  Roland Rack, MD [facility] [phone]  If 7pm- 7am, please page neurology on call as listed in [place]. 04/27/2020  3:02 AM

## 2020-04-27 NOTE — Progress Notes (Signed)
Attempted giving report twice in the last 67mins. Nurse Overton Mam, who is supposed to be taking this patient, has not returned from wherever she was floated to this morning. I will attempt at noon, and if she is still not there, I will give report to [facility] nurse Loma Sousa.

## 2020-04-27 NOTE — Progress Notes (Addendum)
PROGRESS NOTE    Sarah Colon  OEV:[id number] DOB: 1945-11-19 DOA: 03/03/2020 PCP: Patient, No Pcp Per    Brief Narrative:  Sarah Colon is a 74 year old female with past medical history significant for type 2 diabetes mellitus, chronic diastolic congestive heart failure, essential hypertension, paroxysmal atrial fibrillation, CKD stage IV who initially presented to [facility] from SNF with shortness of breath, chest pain, and fatigue.  Patient with poor appetite and was found to have a hemoglobin of 3 on admission.  Denies blood in her stool, or dark/tarry stools.  FOBT was positive in the ED and GI was consulted with possible admission to the hospitalist service.  She underwent EGD and colonoscopy without evidence of bleeding; but multiple polypectomies were performed.  During the hospital course, patient was treated for right lung base pneumonia and prevent C/Enterococcus faecalis UTI in which she completed a course of antibiotics.  Additionally, patient had a complaint of abdominal pain with CT abdomen/pelvis on 04/04/2020 with presacral and rectal findings associated with cortical erosion, sclerosis of unclear etiology.  MRI L-spine consistent with discitis/osteomyelitis.  Patient underwent IR guided aspiration of L4-5 disc with cultures showing no growth.  Infectious disease was consulted and recommended 6-week course of daptomycin and cefepime.  Cefepime was changed to Zosyn due to concerns of AKI as well as possible contributing factor releasing to status epilepticus.  Patient was transferred to [facility] for further and neurology evaluation and continuous EEG on 04/27/2020.  11/28:  Patient continues to be nonresponsive to verbal command and somnolent.  Received IV loading Keppra and valproate early this morning.  Continues on continuous EEG.  Neurology present at bedside; EEG continues to show seizure activity.  Neurology giving 2 mg IV Ativan and 2 g IV Keppra.   Given her persistent status with likely inability to protect airway and need for escalation of care, neurology recommends transfer to ICU for higher level of care.  PCCM consulted for evaluation.  Updated patient's daughter Sarah Colon via telephone this morning, she is okay if patient needs to be intubated for inability to protect airway and escalation of care.   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  CT head with no acute intracranial findings.  MR brain with no acute findings.  Urinalysis unrevealing.  Ammonia level 21.  Continuous EEG showed frontally predominant sharp wave discharges.  Patient was loaded on IV Keppra and valproate.  Seen this morning by neurology at bedside, continues with seizure activity on EEG.  Receiving 2 mg IV Ativan and Keppra 1500 mg twice daily.  Neurology concerned about need for further escalation of care and requesting ICU transfer. --PCCM consulted for evaluation and consideration of ICU transfer --Discussed with daughter, okay for intubation if needed --Keppra 1500 mg IV q12h --N.p.o. 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  Underwent IR lumbar disc aspiration on 04/22/2020 with 2 mL turbid serosanguineous fluid obtained.  Followed  by infectious disease, Dr. Tommy Medal; with recommendations of daptomycin and cefepime at x6 weeks.  Cefepime was discontinued for AKI as well as likely precipitating factor regarding a seizure activity. --Continue empiric antibiotics with Zosyn and daptomycin  Providencia stuartii and VRE UTI --Completed course of antibiotics --Repeat urine culture 11/27 2021: Pending  AKI on CKD stage IV Baseline creatinine 1.5-1.8. --Cr 2.30>>1.41>>4.82>>2.08>2.26 --Continue IV fluid hydration with D5 NS at 75 mL's per hour while n.p.o. --Avoid nephrotoxins, renally dose all medications --Follow renal function closely daily  Symptomatic anemia Hemoglobin 3.5 on admission. FOBT positive. --Hgb 3.5>3.1>10.0>>>8.2 --Ferrous sulfate 325 mg p.o. daily --Protonix 40 mg p.o. daily --Anticoagulant with Eliquis has been resumed --Follow CBC daily  Cirrhosis, new diagnosis Incidental finding of cirrhosis on CT abdomen/pelvis.  MR abdomen degraded exam however no concerning lesions.  Acute hepatitis panel with equivocal results for hepatitis A antibody.  Will need GI follow-up outpatient with repeat hepatitis panel.  Type 2 diabetes mellitus Hemoglobin A1c 5.6 on 03/30/2020, well controlled. Home regimen includes Levemir 20 units subcutaneously daily, Januvia 25 mg p.o. daily --Sensitive insulin sliding scale for coverage  Chronic diastolic congestive heart failure, compensated TTE 03/30/2020, LVEF 65-70%, no LV regional wall motion abnormalities, mild concentric LVH, grade 1 diastolic dysfunction, mild MR, moderate TR, moderate aortic valve sclerosis without evidence of aortic stenosis, IVC dilated.  --metoprolol tartrate 25 mg twice daily --Furosemide 20mg  p.o. twice daily (home dose) --Daily weights, strict I's and notes  Essential hypertension Paroxysmal atrial fibrillation --Diltiazem 120 mg p.o. daily --Metoprolol tartrate 25 mg BID --Apixaban 2.5 mg twice daily (was on Xarelto outpatient but  changed due to renal function)  Hypothyroidism:  --Levothyroxine 88 mcg p.o. daily, may need to convert to IV form if AMS persists  Hx Gout: on allopurinol and colchine  DVT prophylaxis: Eliquis Code Status: DNR, intubation okay per daughter Family Communication: Updated patient's daughter via telephone this morning regarding seizures and likely need of ICU level of care  Disposition Plan:  Status is: Inpatient  Remains inpatient appropriate because:Altered mental status, Ongoing diagnostic testing needed not appropriate for outpatient work up, Unsafe d/c plan, IV treatments appropriate due to intensity of illness or inability to take PO and Inpatient level of care appropriate due to severity of illness patient with refractory status epilepticus, transferring to ICU for higher level of care.   Dispo:  Patient From: Home  Planned Disposition: [place]  Expected discharge date: 04/30/20  Medically stable for discharge: No   Consultants:   Neurology  Infectious disease  PCCM  Procedures:   IR lumbar disc aspiration 04/22/2020  Antimicrobials:   Daptomycin  Zosyn  Cefepime   Subjective: Patient seen and examined bedside, continues on continuous EEG.  Neurology present.  Nonresponsive to verbal command, grimaces.  Will move extremities with noxious stimulation.  Continues with persistent seizure activity on EEG, neurology concerned regarding escalation of care and ability to protect airway requesting transfer to ICU for higher level of care.  Discussed with PCCM.  No family present at bedside, updated daughter via telephone this morning.  Transferring to ICU.  Objective: Vitals:   04/27/20 0400 04/27/20 0444 04/27/20 0830 04/27/20 0837  BP: (!) 158/64 130/69 (!) 162/91 (!) 167/79  Pulse: 71 69 75 76  Resp:  18 17 18   Temp:  97.8 F (36.6 C) 98.2 F (36.8 C) 98.1 F (36.7  C)  TempSrc:  Oral Oral   SpO2: 100% 98% 100%   Weight:  68.2 kg    Height:         Intake/Output Summary (Last 24 hours) at 04/27/2020 0939 Last data filed at 04/26/2020 1832 Gross per 24 hour  Intake 500 ml  Output --  Net 500 ml   Filed Weights   04/26/20 0600 04/26/20 2352 04/27/20 0444  Weight: 66.1 kg 68.2 kg 68.2 kg    Examination:  General exam: Somnolent, unresponsive to verbal command Respiratory system: Coarse upper airway sounds, respiratory effort normal without accessory muscle use, oxygenating well on room air. Cardiovascular system: S1 & S2 heard, RRR. No JVD, murmurs, rubs, gallops or clicks. No pedal edema. Gastrointestinal system: Abdomen is nondistended, soft and nontender. No organomegaly or masses felt. Normal bowel sounds heard. Central nervous system: Somnolent, unresponsive to verbal command, will move extremities to noxious stimuli Extremities: Moving all extremities independently only to noxious stimuli Skin: No rashes, lesions or ulcers Psychiatry: Unable to assess secondary to mental status    Data Reviewed: I have personally reviewed following labs and imaging studies  CBC: Recent Labs  Lab 04/20/20 1107 04/21/20 0437 04/22/20 0832 04/23/20 0520 04/26/20 0446  WBC 16.7* 15.9* 17.7* 19.5* 16.3*  NEUTROABS 11.8*  --   --   --  11.4*  HGB 8.9* 8.3* 8.8* 8.0* 8.4*  HCT 27.5* 25.8* 26.9* 25.0* 26.1*  MCV 77.0* 77.0* 76.9* 77.6* 78.4*  PLT 617* 520* 469* 387 009   Basic Metabolic Panel: Recent Labs  Lab 04/20/20 1107 04/21/20 0437 04/22/20 0832 04/23/20 0520 04/26/20 0446  NA 139 142 142 144 146*  K 3.3* 4.0 3.7 3.8 3.3*  CL 109 109 108 109 106  CO2 23 22 25 25 26   GLUCOSE 154* 110* 117* 113* 93  BUN 34* 35* 32* 35* 32*  CREATININE 2.90* 2.62* 2.57* 2.42* 2.08*  CALCIUM 8.2* 8.3* 8.5* 8.3* 8.3*  MG 1.4*  --   --   --  1.7  PHOS 5.0*  --   --   --  4.0   GFR: Estimated Creatinine Clearance: 20.3 mL/min (A) (by C-G formula based on SCr of 2.08 mg/dL (H)). Liver Function Tests: Recent Labs  Lab  04/20/20 1107 04/21/20 0437 04/22/20 0832 04/23/20 0520 04/26/20 0446  AST 48* 42* 53* 43* 38  ALT 16 15 17 16 13   ALKPHOS 187* 179* 221* 200* 172*  BILITOT 0.6 0.7 0.7 0.7 0.8  PROT 6.7 6.4* 6.9 6.3* 6.5  ALBUMIN 2.6* 2.5* 2.6* 2.3* 2.2*   No results for input(s): LIPASE, AMYLASE in the last 168 hours. Recent Labs  Lab 04/26/20 0709  AMMONIA 21   Coagulation Profile: Recent Labs  Lab 04/22/20 0832  INR 1.5*   Cardiac Enzymes: No results for input(s): CKTOTAL, CKMB, CKMBINDEX, TROPONINI in the last 168 hours. BNP (last 3 results) No results for input(s): PROBNP in the last 8760 hours. HbA1C: No results for input(s): HGBA1C in the last 72 hours. CBG: Recent Labs  Lab 04/26/20 0617 04/26/20 0748 04/26/20 1615 04/26/20 2139 04/27/20 0612  GLUCAP 89 111* 101* 99 106*   Lipid Profile: No results for input(s): CHOL, HDL, LDLCALC, TRIG, CHOLHDL, LDLDIRECT in the last 72 hours. Thyroid Function Tests: Recent Labs    04/26/20 1005  TSH 7.333*  FREET4 1.34*   Anemia Panel: No results for input(s): VITAMINB12, FOLATE, FERRITIN, TIBC, IRON, RETICCTPCT in the last 72 hours. Sepsis Labs: Recent Labs  Lab 04/20/20 1107 04/21/20  1950  PROCALCITON 0.60 0.61    Recent Results (from the past 240 hour(s))  Resp Panel by RT-PCR (Flu A&B, Covid) Nasopharyngeal Swab     Status: None   Collection Time: 04/18/20 12:21 PM   Specimen: Nasopharyngeal Swab; Nasopharyngeal(NP) swabs in vial transport medium  Result Value Ref Range Status   SARS Coronavirus 2 by RT PCR NEGATIVE NEGATIVE Final    Comment: (NOTE) SARS-CoV-2 target nucleic acids are NOT DETECTED.  The SARS-CoV-2 RNA is generally detectable in upper respiratory specimens during the acute phase of infection. The lowest concentration of SARS-CoV-2 viral copies this assay can detect is 138 copies/mL. A negative result does not preclude SARS-Cov-2 infection and should not be used as the sole basis for treatment  or other patient management decisions. A negative result may occur with  improper specimen collection/handling, submission of specimen other than nasopharyngeal swab, presence of viral mutation(s) within the areas targeted by this assay, and inadequate number of viral copies(<138 copies/mL). A negative result must be combined with clinical observations, patient history, and epidemiological information. The expected result is Negative.  Fact Sheet for Patients:  EntrepreneurPulse.com.au  Fact Sheet for Healthcare Providers:  IncredibleEmployment.be  This test is no t yet approved or cleared by the [place] FDA and  has been authorized for detection and/or diagnosis of SARS-CoV-2 by FDA under an Emergency Use Authorization (EUA). This EUA will remain  in effect (meaning this test can be used) for the duration of the COVID-19 declaration under Section 564(b)(1) of the Act, 21 U.S.C.section 360bbb-3(b)(1), unless the authorization is terminated  or revoked sooner.       Influenza A by PCR NEGATIVE NEGATIVE Final   Influenza B by PCR NEGATIVE NEGATIVE Final    Comment: (NOTE) The Xpert Xpress SARS-CoV-2/FLU/RSV plus assay is intended as an aid in the diagnosis of influenza from Nasopharyngeal swab specimens and should not be used as a sole basis for treatment. Nasal washings and aspirates are unacceptable for Xpert Xpress SARS-CoV-2/FLU/RSV testing.  Fact Sheet for Patients: EntrepreneurPulse.com.au  Fact Sheet for Healthcare Providers: IncredibleEmployment.be  This test is not yet approved or cleared by the [place] FDA and has been authorized for detection and/or diagnosis of SARS-CoV-2 by FDA under an Emergency Use Authorization (EUA). This EUA will remain in effect (meaning this test can be used) for the duration of the COVID-19 declaration under Section 564(b)(1) of the Act, 21 U.S.C. section  360bbb-3(b)(1), unless the authorization is terminated or revoked.  Performed at [facility], [place] [address], [place], [place] [address]   Aerobic/Anaerobic Culture (surgical/deep wound)     Status: None (Preliminary result)   Collection Time: 04/22/20  4:10 PM   Specimen: Abscess  Result Value Ref Range Status   Specimen Description   Final    ABSCESS L4/5 DISC ASPIRATION Performed at [place] [address] [place], [place] [address]    Special Requests   Final    Normal Performed at [facility], [place] [address], [place], [place] [address]    Gram Stain NO WBC SEEN NO ORGANISMS SEEN   Final   Culture   Final    NO GROWTH 4 DAYS NO ANAEROBES ISOLATED; CULTURE IN PROGRESS FOR 5 DAYS Performed at [place] [address], [place],  [address]    Report Status PENDING  Incomplete         Radiology Studies: EEG  Result Date: 04/27/2020 Greta Doom, MD  04/27/2020  3:18 AM Patient Name: Sarah Colon MRN: [id number] EEG Attending: Roland Rack Referring Physician/Provider: Roland Rack Date: 04/27/2020 Duration: 22 minutes Patient history: 74 year old female being evaluated for acute mental status change Level of alertness: Awake AEDs during EEG study: Technical aspects: This EEG study was done with scalp electrodes positioned according to the 10-20 International system of electrode placement. Electrical activity was acquired at a sampling rate of 500Hz  and reviewed with a high frequency filter of 70Hz  and a low frequency filter of 1Hz . EEG data were recorded continuously and digitally stored. BACKGROUND ACTIVITY: The background is dominated by frequent 2.5 to 3 Hz frontally predominant sharp wave discharges.  This sometimes gives way to a 2 to 3-second run of 5 to 6 Hz generalized theta followed by resumption of the 2 to 3 Hz sharp wave discharges.  There is some waxing and  waning in amplitude and morphology, but no clear frequency evolution.  Following Ativan administration, this pattern resolves and the background is dominated by irregular slow activity, predominantly theta range. SLEEP RECORDINGS: No clear sleep structures were seen. ACTIVATION PROCEDURES: Hyperventilation and photic stimulation were not performed. 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 Roland Rack, MD [facility] [phone] If 7pm- 7am, please page neurology on call as listed in [place].   CT HEAD WO CONTRAST  Result Date: 04/26/2020 CLINICAL DATA:  Mental status change. EXAM: CT HEAD WITHOUT CONTRAST TECHNIQUE: Contiguous axial images were obtained from the base of the skull through the vertex without intravenous contrast. COMPARISON:  CT head August 25, 2015. FINDINGS: Brain: No evidence of acute large vascular territory infarction, hemorrhage, hydrocephalus, extra-axial collection or mass lesion/mass effect. Mild white matter hypoattenuation, likely related to chronic microvascular ischemic disease. Vascular: Calcific atherosclerosis. Skull: No acute fracture.  Hyperostosis frontalis. Sinuses/Orbits: No acute finding.  Visualized sinuses are clear. Other: No mastoid effusions. IMPRESSION: No evidence of acute intracranial abnormality. Electronically Signed   By: Margaretha Sheffield MD   On: 04/26/2020 07:42   MR BRAIN WO CONTRAST  Result Date: 04/26/2020 CLINICAL DATA:  Delirium. EXAM: MRI HEAD WITHOUT CONTRAST TECHNIQUE: Multiplanar, multiecho pulse sequences of the brain and surrounding structures were obtained without intravenous contrast. COMPARISON:  Same day CT head. FINDINGS: Brain: No acute infarction, hemorrhage, hydrocephalus, extra-axial collection or mass lesion. Mild patchy T2/FLAIR hyperintensities within the white matter, nonspecific but most likely secondary to chronic microvascular ischemic  disease. Vascular: Major arterial flow voids are maintained at the skull base. Skull and upper cervical spine: Normal marrow signal. Sinuses/Orbits: Negative. Other: No mastoid effusions. IMPRESSION: No evidence of acute intracranial abnormality. Electronically Signed   By: Margaretha Sheffield MD   On: 04/26/2020 13:11   IR Fluoro Guide CV Line Right  Result Date: 04/25/2020 INDICATION: 74 year old female with osteomyelitis discitis EXAM: IR ULTRASOUND GUIDANCE VASC ACCESS RIGHT; IR RIGHT FLUORO GUIDE CV LINE MEDICATIONS: None. ANESTHESIA/SEDATION: None. FLUOROSCOPY TIME:  Fluoroscopy Time: 0 minutes 30 seconds (3 mGy). COMPLICATIONS: None immediate. PROCEDURE: Informed written consent was obtained from the patient after a thorough discussion of the procedural risks, benefits and alternatives. All questions were addressed. Maximal Sterile Barrier Technique was utilized including caps, mask, sterile gowns, sterile gloves, sterile drape, hand hygiene and skin antiseptic. A timeout was performed prior to the initiation of the procedure. The right internal jugular vein was interrogated with ultrasound and found to be widely patent. An image was obtained and stored for the medical record. Local anesthesia was attained by  infiltration with 1% lidocaine. A small dermatotomy was made. Under real-time sonographic guidance, the vessel was punctured with a 21 gauge micropuncture needle. Using standard technique, the initial micro needle was exchanged over a 0.018 micro wire for a transitional 4 [place] micro sheath. A suitable skin entry site was then selected on the anterior chest. Local anesthesia was again attained by infiltration with 1% lidocaine. A small dermatotomy was made. A 6 [place] dual lumen power injectable central venous catheter was then tunneled from the skin exit site to the dermatotomy overlying the venous access site. Catheter was cut to 21 cm in length. The 4 French micro sheath was then exchanged for a  peel-away sheath which was advanced into the superior vena cava. The catheter was then advanced through the peel-away sheath and the peel-away sheath was discarded. The position of the catheter tip was confirmed to be in the upper right atrium on fluoroscopy. An image was obtained and stored for the medical record. The catheter flushes and aspirates well. The catheter was flushed with heparinized saline and capped. The catheter was then secured to the skin with 0 Prolene suture. IMPRESSION: Successful placement of a 6 [place] dual lumen power injectable tunneled central venous catheter via the right internal jugular vein. Catheter tips are in the upper right atrium and the catheter is ready for immediate use. Electronically Signed   By: Jacqulynn Cadet M.D.   On: 04/25/2020 16:03   IR US Guide Vasc Access Right  Result Date: 04/25/2020 INDICATION: 74 year old female with osteomyelitis discitis EXAM: IR ULTRASOUND GUIDANCE VASC ACCESS RIGHT; IR RIGHT FLUORO GUIDE CV LINE MEDICATIONS: None. ANESTHESIA/SEDATION: None. FLUOROSCOPY TIME:  Fluoroscopy Time: 0 minutes 30 seconds (3 mGy). COMPLICATIONS: None immediate. PROCEDURE: Informed written consent was obtained from the patient after a thorough discussion of the procedural risks, benefits and alternatives. All questions were addressed. Maximal Sterile Barrier Technique was utilized including caps, mask, sterile gowns, sterile gloves, sterile drape, hand hygiene and skin antiseptic. A timeout was performed prior to the initiation of the procedure. The right internal jugular vein was interrogated with ultrasound and found to be widely patent. An image was obtained and stored for the medical record. Local anesthesia was attained by infiltration with 1% lidocaine. A small dermatotomy was made. Under real-time sonographic guidance, the vessel was punctured with a 21 gauge micropuncture needle. Using standard technique, the initial micro needle was exchanged over a  0.018 micro wire for a transitional 4 [place] micro sheath. A suitable skin entry site was then selected on the anterior chest. Local anesthesia was again attained by infiltration with 1% lidocaine. A small dermatotomy was made. A 6 [place] dual lumen power injectable central venous catheter was then tunneled from the skin exit site to the dermatotomy overlying the venous access site. Catheter was cut to 21 cm in length. The 4 French micro sheath was then exchanged for a peel-away sheath which was advanced into the superior vena cava. The catheter was then advanced through the peel-away sheath and the peel-away sheath was discarded. The position of the catheter tip was confirmed to be in the upper right atrium on fluoroscopy. An image was obtained and stored for the medical record. The catheter flushes and aspirates well. The catheter was flushed with heparinized saline and capped. The catheter was then secured to the skin with 0 Prolene suture. IMPRESSION: Successful placement of a 6 [place] dual lumen power injectable tunneled central venous catheter via the right internal jugular vein. Catheter tips are in  the upper right atrium and the catheter is ready for immediate use. Electronically Signed   By: Jacqulynn Cadet M.D.   On: 04/25/2020 16:03   DG CHEST PORT 1 VIEW  Result Date: 04/26/2020 CLINICAL DATA:  Altered mental status. EXAM: PORTABLE CHEST 1 VIEW COMPARISON:  April 03, 2020 FINDINGS: There is a right internal jugular venous catheter with its distal tip noted at the junction of the superior vena cava and right atrium. Predominant stable mild atelectasis and/or early infiltrate is seen within the lateral aspect and retrocardiac region of the right lung base. There is no evidence of a pleural effusion or pneumothorax. The cardiac silhouette is mildly enlarged. Degenerative changes seen involving the left shoulder. IMPRESSION: 1. Right internal jugular venous catheter positioning, as described above,  without evidence of a pneumothorax. 2. Stable mild right basilar atelectasis and/or early infiltrate. Electronically Signed   By: Virgina Norfolk M.D.   On: 04/26/2020 15:09        Scheduled Meds: . allopurinol  100 mg Oral Daily  . apixaban  2.5 mg Oral BID  . Chlorhexidine Gluconate Cloth  6 each Topical Daily  . colchicine  0.3 mg Oral QODAY  . diclofenac Sodium  2 g Topical QID  . diltiazem  120 mg Oral Daily  . feeding supplement (GLUCERNA SHAKE)  237 mL Oral TID BM  . ferrous sulfate  325 mg Oral Q breakfast  . furosemide  20 mg Oral BID  . guaiFENesin  1,200 mg Oral BID  . insulin aspart  0-9 Units Subcutaneous TID WC  . lactulose  10 g Oral BID  . levothyroxine  88 mcg Oral Q0600  . LORazepam  2 mg Intravenous Once  . metoprolol tartrate  25 mg Oral BID  . pantoprazole  40 mg Oral Daily  . sodium bicarbonate  1,300 mg Oral TID  . sodium chloride flush  10-40 mL Intracatheter Q12H  . sodium chloride flush  3 mL Intravenous Q12H   Continuous Infusions: . sodium chloride Stopped (04/01/20 0906)  . DAPTOmycin (CUBICIN)  IV 500 mg (04/25/20 1827)  . dextrose 50 mL/hr at 04/26/20 1442  . levETIRAcetam    . piperacillin-tazobactam (ZOSYN)  IV 3.375 g (04/27/20 0820)     LOS: 29 days    Critical Care Time Upon my evaluation, this patient had a high probability of imminent or life-threatening deterioration due to refractory status epilepticus, which required my direct attention, intervention, and personal management.  I have personally provided 42 minutes of critical care time exclusive of my time spent on separately billable procedures.  Time includes review of laboratory data, radiology results, discussion with consultants, and monitoring for potential decompensation.  Transferring to intensive care unit     Terina Mcelhinny J [place] DO [facility] Available via Epic secure chat 7am-7pm After these hours, please refer to coverage provider listed on  [email or web address] 04/27/2020, 9:39 AM

## 2020-04-27 NOTE — Progress Notes (Signed)
STAT EEG complete - results pending LTM to follow same leads

## 2020-04-27 NOTE — Progress Notes (Signed)
Gave report to [facility], Therapist, sports. Will disconnect patient fr69m EEG and transfer to 34M, room 6

## 2020-04-27 NOTE — Progress Notes (Signed)
NEUROLOGY CONSULTATION PROGRESS NOTE   Date of service: April 27, 2020 Patient Name: Sarah Colon MRN:  [id number] DOB:  04/30/1946  Brief HPI   15F with Afibb, CKD, DM2 who was transferred to [facility] for relatively rapid worsening of AMS and obtunded over a few hours. MRI Brain was negative. EEG with frequent discharges concerning for non-convulsive status epilepticus with clinical improvement in mentation with 3mg  of Ativan. She was given Depacon 2500mg  IV once only   Interval Hx   Has been having seizures since 0800 on 04/27/20.  Vitals   Vitals:   04/27/20 0222 04/27/20 0247 04/27/20 0400 04/27/20 0444  BP: (!) 147/119 (!) 160/60 (!) 158/64 130/69  Pulse: 92 (!) 57 71 69  Resp:    18  Temp:    97.8 F (36.6 C)  TempSrc:    Oral  SpO2: 100% 100% 100% 98%  Weight:    68.2 kg  Height:         Body mass index is 28.41 kg/m.  Physical Exam   General: Laying comfortably in bed; in no acute distress. HENT: Normal oropharynx and mucosa. Normal external appearance of ears and nose. Neck: Supple, no pain or tenderness CV: No JVD. No peripheral edema. Pulmonary: Symmetric Chest rise. Normal respiratory effort. Abdomen: Soft to touch, non-tender. Ext: No cyanosis, edema, or deformity Skin: No rash. Normal palpation of skin.  Musculoskeletal: Normal digits and nails by inspection. No clubbing.  Neurologic Examination  Mental status/Cognition: eyes closed, does not open eyes to voice or loud noise or to sternal rub. Grimace and localizes with Bl upper extremities to nares stimulation Speech/language: Moans only.  Eyes tight shut and prevents pupil assessment. Moves all extremities to noxious stimuli  Labs   Basic Metabolic Panel:  Lab Results  Component Value Date   NA 146 (H) 04/26/2020   K 3.3 (L) 04/26/2020   CO2 26 04/26/2020   GLUCOSE 93 04/26/2020   BUN 32 (H) 04/26/2020   CREATININE 2.08 (H) 04/26/2020   CALCIUM 8.3 (L) 04/26/2020   GFRNONAA 24  (L) 04/26/2020   GFRAA 40 (L) 12/25/2019   HbA1c:  Lab Results  Component Value Date   HGBA1C 5.6 03/30/2020   LDL: No results found for: [facility] Urine Drug Screen: No results found for: LABOPIA, COCAINSCRNUR, LABBENZ, AMPHETMU, THCU, LABBARB  Alcohol Level No results found for: ETH No results found for: PHENYTOIN, ZONISAMIDE, LAMOTRIGINE, LEVETIRACETA No results found for: PHENYTOIN, PHENOBARB, VALPROATE, CBMZ  Imaging and Diagnostic studies   MRI Brain w/o contrast: No evidence of acute intracranial abnormality.  Impression   Sarah Colon is a 74 y.o. female with Afibb, CKD, DM2 who was transferred to [facility] for relatively rapid worsening of AMS and obtunded over a few hours found to be in status which resolved with ativan. Now having seizures again. Given Ativan 2mg  and Keppra 1000mg  with improved EEG.  Airway could potentially become an issue with escalation of AED treatment specially with her poor mentation and now benzos on board. Will have ICU monitoring to ensure we stay on top of airway.  Recommendations  - I ordered Ativan 2mg  additional once in AM - Started Keppra 1000mg  BID with a dose ordered right now. - She was loaded with Depakote in AM, will get Depakote levels. - if more seizures, will either do an additional small loading dose of Depakote vs Vimpat. - cEEG ______________________________________________________________________   Thank you for the opportunity to take part in the care of this  patient. If you have any further questions, please contact the neurology consultation attending.  Signed,  [place] Pager Number [phone]

## 2020-04-27 NOTE — Progress Notes (Signed)
Received order from Neurology for 2mg  ativan, as patient still showing signs of seizure activity on EEG. Administered 2mg  of ativan. Intensivist also present, spoke with daughter who is amenable to intubation if needed; therefore, MD entered orders to transfer to ICU.

## 2020-04-27 NOTE — Progress Notes (Signed)
LTM EEG hooked up and running - no initial skin breakdown - push button tested - neuro notified. [facility] monitored 

## 2020-04-27 NOTE — Progress Notes (Signed)
Patient lying in bed, continuous EEG monitor in place. Patient has eyes closed, noted to be moaning but unable to speak or respond to questions. During assessment patient was able to follow directions once, and very weakly squeezed my hand with her right hand. She was unable to do this on the left side, nor was she able to open her eyes or move any other extremity. Resps even and unlabored, lungs clear, oxygen remains at 100% on pulse ox. She has a double lumen IJ access for meds and fluids; both ports flush well and the proximal is currently infusing maintenance fluids and zosyn. Bowel sounds are normo to hypo active in all quads. Unknown when her last bowel movement was, purewick in place but minimal output noted thus far this morning. Patient has skin breakdown on sacrum; will reposition frequently today as tolerated. Patient has 1-2+ edema in lower extremities with 4+ edema to bilateral feet. Bilateral heels are mushy; foam dressings in place and elevated off bed with pillow support. Patient appeared to be in some discomfort, so repositioned with resolution of moaning. Neurologist and hospitalist at bedside as well and their neuro check was similar to mine this morning. Plan to continue depakote, ativan and keppra- orders to be entered. Also, reduce blood sugar monitoring to once daily, since patient has had normal blood sugars. Will monitor patient with frequent contacts, as she is unable to make needs known.
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
## 2020-04-27 NOTE — Progress Notes (Addendum)
Subjective: Continues to be encephalopathic.   Exam: Vitals:   04/26/20 2240 04/26/20 2352  BP: (!) 152/80 (!) 182/126  Pulse: 83 93  Resp: (!) 22 18  Temp: 98.1 F (36.7 C) 98.3 F (36.8 C)  SpO2: 98% 100%   Gen: In bed, NAD Resp: non-labored breathing, no acute distress Abd: soft, nt  Neuro: MS: opens eyes, but does not engage the examiner or follow commands.  PP:HKFEX, Eyes intermittently with rightward gaze, but most often forward, I do not see her look to the left.  Motor: she has tremulous movements of all extremities, holds the left arm against gravity, less movement of the right arm.  Sensory: grimaces to noxious stimulation in all 4 extremities.   Pertinent Labs: TSH 7.3, Free t4 mildly high at 1.34 Ammonia 21 Cr 2.08 WBC 16.3 VBG 7.48/38/37  Impression: 74 yo F with encephalopathy in the setting of VRE UTI.  The relatively rapid development of her encephalopathy is concerning.  With negative MRI, other possibilities need to be considered including seizure, cephalosporin induced neurotoxicity, or possibly septic encephalopathy.  She has been changed from cefepime to Zosyn/daptomycin.  She was also given empiric Keppra as well.  Recommendations: 1) EEG (will perform stat) 2) agree with changing from cephalosporin 3) discontinue tramadol  4) neurology will continue to follow  Roland Rack, MD [facility] [phone]  If 7pm- 7am, please page neurology on call as listed in [place].

## 2020-04-27 NOTE — Procedures (Addendum)
Patient Name: Sarah Colon  MRN: [id number]  Epilepsy Attending: Lora Havens  Referring Physician/Provider: Dr Roland Rack Duration: 04/27/2020 0231 to 04/28/2020 0231  Patient history: 74 yo F with encephalopathy in the setting of VRE UTI.  Stat eeg consistent with status epilepticus. EEG to evaluate for seizure.  Level of alertness: Awake, asleep  AEDs during EEG study: LEV, VPA  Technical aspects: This EEG study was done with scalp electrodes positioned according to the 10-20 International system of electrode placement. Electrical activity was acquired at a sampling rate of 500Hz  and reviewed with a high frequency filter of 70Hz  and a low frequency filter of 1Hz . EEG data were recorded continuously and digitally stored.   Description: No clear posterior dominant rhythm was seen. Sleep was characterized by vertex waves, sleep spindles (12 to 14 Hz), maximal frontocentral region.  EEG showed continuous generalized 3 to 6 Hz theta-delta slowing. Sharp waves and spikes were noted in bifrontal L>R region. Periodic discharges were triphasic morphology were also noted, at times rhythmic.    After around 0755 on 04/27/2020, eeg showed generalized periodic discharges with triphasic morphology at 1hz  which gradually appeared quasi rhythmic and had evolution in morphology and frequency. Per eeg tech, patient was noted to have episodes if right arm twitching which were difficult to visualize on camera.    Patient's AEDs were adjusted. However when patient was stimulated, the periodic discharges became more frequent at 2 Hz and appeared rhythmic with some evolution and morphology as well concerning for stimulus induced rhythmic periodic ictal-interictal discharges ( SIRPIDS).  At times patient was noted to have subtle side-to-side head movements during these EEG changes.  ABNORMALITY - Sharp waves, bifrontal, left more than right - Periodic Epileptiform discharges with triphasic  morphology, generalized -Stimulation induced rhythmic periodic ictal hyper interictal discharges (SIRPIDS) -Continuous slow, generalized  IMPRESSION: This study showed evidence of epileptogenicity arising from left more than right frontal region. There were also periodic epileptiform discharges with triphasic morphology which appeared rhythmic after stimulation and at frequency of 2 Hz which is on the ictal pattern interictal continuum with high potential for seizures.  However, no significant change in SIRPIDS was noted after titration of AEDs which suggests interictal nature. Additionally there was evidence of moderate to severe diffuse encephalopathy, nonspecific etiology.   Miche Loughridge Barbra Sarks

## 2020-04-27 NOTE — Plan of Care (Signed)
°  Problem: Clinical Measurements: Goal: Diagnostic test results will improve Outcome: Progressing   Problem: Clinical Measurements: Goal: Will remain free from infection Outcome: Not Progressing   Problem: Nutrition: Goal: Adequate nutrition will be maintained Outcome: Not Progressing

## 2020-04-27 NOTE — Procedures (Signed)
Patient Name: Sarah Colon  MRN: [id number]  EEG Attending: Roland Rack  Referring Physician/Provider: Roland Rack Date: 04/27/2020 Duration: 22 minutes  Patient history: 74 year old female being evaluated for acute mental status change  Level of alertness: Awake  AEDs during EEG study:   Technical aspects: This EEG study was done with scalp electrodes positioned according to the 10-20 International system of electrode placement. Electrical activity was acquired at a sampling rate of 500Hz  and reviewed with a high frequency filter of 70Hz  and a low frequency filter of 1Hz . EEG data were recorded continuously and digitally stored.   BACKGROUND ACTIVITY: The background is dominated by frequent 2.5 to 3 Hz frontally predominant sharp wave discharges.  This sometimes gives way to a 2 to 3-second run of 5 to 6 Hz generalized theta followed by resumption of the 2 to 3 Hz sharp wave discharges.  There is some waxing and waning in amplitude and morphology, but no clear frequency evolution.  Following Ativan administration, this pattern resolves and the background is dominated by irregular slow activity, predominantly theta range.   SLEEP RECORDINGS:  No clear sleep structures were seen.  ACTIVATION PROCEDURES:  Hyperventilation and photic stimulation were not performed.  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  Roland Rack, MD [facility] [phone]  If 7pm- 7am, please page neurology on call as listed in [place].

## 2020-04-27 NOTE — Progress Notes (Signed)
Patient transferred from [facility] at 2345.  Arrived on floor unresponsive to painful or loud stimuli.  Significant amount of oral secretions under face mask.  Concerned regarding patients ability to manage secretions.  Oral suctioning provided.  Neurology and EEG to floor to evaluate and perform continuous EEG.  Patient mental status markedly improved after 3mg  IV ativan.  Depakote bolus given per neurology order.  Patient resting at this time with EEG ongoing.  Does not appear to be in distress at this time and VS WNL for patient.

## 2020-04-27 NOTE — Significant Event (Addendum)
Rapid Response Event Note   Reason for Call :  Pt transferred here from [facility]. EEG done showing seizures. RRT assisted RN with treating seizure, getting VS, etc.   Initial Focused Assessment:  Pt laying in bed with eyes closed, tense and shaking. Pt would grimace to painful stimuli. Pt drooling. Skin warm and dry. Pt hooked up to EEG machine.   Pt attached to dinamap for frequent VS. Pt given 2mg  Ativan IV x 1 followed by an additional 1mg  Ativan IV. After second dose of Ativan given, shaking stopped, pt's muscles relaxed, and pt opened eyes and began looking around room. Pt was able to tell me her name and repeat some words. Pt very confused.     Interventions:  2mg  Ativan IV x 1 1mg  Ativan IV x 1 Depacon 2500mg  IV STAT Continuouse EEG overnight Plan of Care:  Give Depacon. Continuous EEG tonight. Continue to monitor pt closely. Please call RRT if seizure activity returns or if further assistance needed.   Event Summary:   MD Notified: Dr. Leonel Ramsay at bedside PTA RRT Call Time:0220 Arrival Time:0220(already on unit seeing another pt End Time:0245  Dillard Essex, RN

## 2020-04-28 DIAGNOSIS — G40901 Epilepsy, unspecified, not intractable, with status epilepticus: Secondary | ICD-10-CM | POA: Diagnosis not present

## 2020-04-28 DIAGNOSIS — G9341 Metabolic encephalopathy: Secondary | ICD-10-CM | POA: Diagnosis not present

## 2020-04-28 DIAGNOSIS — R4182 Altered mental status, unspecified: Secondary | ICD-10-CM | POA: Diagnosis not present

## 2020-04-28 DIAGNOSIS — M4647 Discitis, unspecified, lumbosacral region: Secondary | ICD-10-CM | POA: Diagnosis not present

## 2020-04-28 LAB — BASIC METABOLIC PANEL
Anion gap: 12 (ref 5–15)
BUN: 23 mg/dL (ref 8–23)
CO2: 28 mmol/L (ref 22–32)
Calcium: 8.2 mg/dL — ABNORMAL LOW (ref 8.9–10.3)
Chloride: 105 mmol/L (ref 98–111)
Creatinine, Ser: 2.26 mg/dL — ABNORMAL HIGH (ref 0.44–1.00)
GFR, Estimated: 22 mL/min — ABNORMAL LOW (ref >60)
Glucose, Bld: 86 mg/dL (ref 70–99)
Potassium: 3.1 mmol/L — ABNORMAL LOW (ref 3.5–5.1)
Sodium: 145 mmol/L (ref 135–145)

## 2020-04-28 LAB — CBC
HCT: 25.5 % — ABNORMAL LOW (ref 36.0–46.0)
Hemoglobin: 8.1 g/dL — ABNORMAL LOW (ref 12.0–15.0)
MCH: 25.1 pg — ABNORMAL LOW (ref 26.0–34.0)
MCHC: 31.8 g/dL (ref 30.0–36.0)
MCV: 78.9 fL — ABNORMAL LOW (ref 80.0–100.0)
Platelets: 278 10*3/uL (ref 150–400)
RBC: 3.23 MIL/uL — ABNORMAL LOW (ref 3.87–5.11)
RDW: 30.6 % — ABNORMAL HIGH (ref 11.5–15.5)
WBC: 12.8 10*3/uL — ABNORMAL HIGH (ref 4.0–10.5)
nRBC: 0 % (ref 0.0–0.2)

## 2020-04-28 LAB — GLUCOSE, CAPILLARY
Glucose-Capillary: 72 mg/dL (ref 70–99)
Glucose-Capillary: 76 mg/dL (ref 70–99)
Glucose-Capillary: 85 mg/dL (ref 70–99)
Glucose-Capillary: 76 mg/dL (ref 70–99)
Glucose-Capillary: 91 mg/dL (ref 70–99)
Glucose-Capillary: 118 mg/dL — ABNORMAL HIGH (ref 70–99)

## 2020-04-28 LAB — MAGNESIUM: Magnesium: 1.9 mg/dL (ref 1.7–2.4)

## 2020-04-28 LAB — CREATININE, SERUM
Creatinine, Ser: 2.19 mg/dL — ABNORMAL HIGH (ref 0.44–1.00)
GFR, Estimated: 23 mL/min — ABNORMAL LOW (ref >60)

## 2020-04-28 LAB — VALPROIC ACID LEVEL: Valproic Acid Lvl: 65 ug/mL (ref 50.0–100.0)

## 2020-04-28 MED ORDER — LACTULOSE 10 GM/15ML PO SOLN
10.0000 g | Freq: Two times a day (BID) | ORAL | Status: DC
  Administered 2020-04-28 – 2020-05-01 (×7): 10 g
  Filled 2020-04-28 (×7): qty 15

## 2020-04-28 MED ORDER — LORAZEPAM 2 MG/ML IJ SOLN
2.0000 mg | INTRAMUSCULAR | Status: AC
  Administered 2020-04-28: 2 mg via INTRAVENOUS
  Filled 2020-04-28: qty 1

## 2020-04-28 MED ORDER — OSMOLITE 1.2 CAL PO LIQD
1000.0000 mL | ORAL | Status: DC
  Filled 2020-04-28: qty 1000

## 2020-04-28 MED ORDER — PIPERACILLIN-TAZOBACTAM 3.375 G IVPB
3.3750 g | Freq: Two times a day (BID) | INTRAVENOUS | Status: DC
  Administered 2020-04-28 – 2020-05-04 (×12): 3.375 g via INTRAVENOUS
  Filled 2020-04-28 (×14): qty 50

## 2020-04-28 MED ORDER — LORAZEPAM 2 MG/ML IJ SOLN
INTRAMUSCULAR | Status: AC
  Administered 2020-04-28: 1 mg
  Filled 2020-04-28: qty 1

## 2020-04-28 MED ORDER — DIPHENHYDRAMINE HCL 12.5 MG/5ML PO ELIX: 12.5000 mg | ORAL_SOLUTION | Freq: Three times a day (TID) | ORAL | Status: DC | PRN

## 2020-04-28 MED ORDER — LEVETIRACETAM IN NACL 1000 MG/100ML IV SOLN
1000.0000 mg | INTRAVENOUS | Status: AC
  Administered 2020-04-28: 1000 mg via INTRAVENOUS
  Filled 2020-04-28: qty 100

## 2020-04-28 MED ORDER — LORAZEPAM 2 MG/ML IJ SOLN: 2.0000 mg | INTRAMUSCULAR | Status: DC | PRN

## 2020-04-28 MED ORDER — LORAZEPAM 2 MG/ML IJ SOLN
1.0000 mg | Freq: Once | INTRAMUSCULAR | Status: AC
  Administered 2020-04-28: 1 mg via INTRAVENOUS

## 2020-04-28 MED ORDER — PROSOURCE TF PO LIQD
45.0000 mL | Freq: Two times a day (BID) | ORAL | Status: DC
  Administered 2020-04-28 – 2020-05-04 (×13): 45 mL
  Filled 2020-04-28 (×14): qty 45

## 2020-04-28 MED ORDER — LEVETIRACETAM IN NACL 500 MG/100ML IV SOLN
500.0000 mg | Freq: Two times a day (BID) | INTRAVENOUS | Status: DC
  Administered 2020-04-28 – 2020-05-04 (×13): 500 mg via INTRAVENOUS
  Filled 2020-04-28 (×14): qty 100

## 2020-04-28 MED ORDER — FERROUS SULFATE 220 (44 FE) MG/5ML PO ELIX
325.0000 mg | ORAL_SOLUTION | Freq: Every day | ORAL | Status: DC
  Administered 2020-04-29 – 2020-05-04 (×6): 325 mg
  Filled 2020-04-28 (×7): qty 7.4

## 2020-04-28 MED ORDER — METOPROLOL TARTRATE 25 MG PO TABS
25.0000 mg | ORAL_TABLET | Freq: Two times a day (BID) | ORAL | Status: DC
  Administered 2020-04-28 – 2020-04-29 (×3): 25 mg
  Filled 2020-04-28 (×4): qty 2

## 2020-04-28 MED ORDER — FUROSEMIDE 20 MG PO TABS
20.0000 mg | ORAL_TABLET | Freq: Two times a day (BID) | ORAL | Status: DC
  Administered 2020-04-28 – 2020-04-30 (×4): 20 mg
  Filled 2020-04-28 (×4): qty 1

## 2020-04-28 MED ORDER — DILTIAZEM HCL 60 MG PO TABS
30.0000 mg | ORAL_TABLET | Freq: Four times a day (QID) | ORAL | Status: DC
  Administered 2020-04-28 – 2020-05-04 (×24): 30 mg
  Filled 2020-04-28 (×27): qty 1

## 2020-04-28 MED ORDER — APIXABAN 2.5 MG PO TABS
2.5000 mg | ORAL_TABLET | Freq: Two times a day (BID) | ORAL | Status: DC
  Administered 2020-04-28 – 2020-05-04 (×12): 2.5 mg
  Filled 2020-04-28 (×13): qty 1

## 2020-04-28 MED ORDER — ALLOPURINOL 100 MG PO TABS
100.0000 mg | ORAL_TABLET | Freq: Every day | ORAL | Status: DC
  Administered 2020-04-29: 100 mg
  Filled 2020-04-28: qty 1

## 2020-04-28 MED ORDER — ACETAMINOPHEN 160 MG/5ML PO SOLN
650.0000 mg | Freq: Four times a day (QID) | ORAL | Status: DC | PRN
  Administered 2020-05-03: 650 mg
  Filled 2020-04-28: qty 20.3

## 2020-04-28 MED ORDER — POLYETHYLENE GLYCOL 3350 17 G PO PACK: 17.0000 g | PACK | Freq: Every day | ORAL | Status: DC | PRN

## 2020-04-28 MED ORDER — OSMOLITE 1.5 CAL PO LIQD
1000.0000 mL | ORAL | Status: DC
  Administered 2020-04-28 – 2020-05-02 (×3): 1000 mL
  Filled 2020-04-28 (×4): qty 1000

## 2020-04-28 MED ORDER — LEVOTHYROXINE SODIUM 88 MCG PO TABS
88.0000 ug | ORAL_TABLET | Freq: Every day | ORAL | Status: DC
  Administered 2020-04-29 – 2020-05-04 (×6): 88 ug
  Filled 2020-04-28 (×7): qty 1

## 2020-04-28 MED ORDER — LORAZEPAM 2 MG/ML IJ SOLN
INTRAMUSCULAR | Status: AC
  Filled 2020-04-28: qty 1

## 2020-04-28 MED ORDER — COLCHICINE 0.3 MG HALF TABLET
0.3000 mg | ORAL_TABLET | ORAL | Status: DC
  Administered 2020-04-29: 0.3 mg
  Filled 2020-04-28: qty 1

## 2020-04-28 MED ORDER — ACETAMINOPHEN 160 MG/5ML PO SOLN: 650.0000 mg | Freq: Four times a day (QID) | ORAL | Status: DC | PRN

## 2020-04-28 MED ORDER — VALPROATE SODIUM 500 MG/5ML IV SOLN
1000.0000 mg | Freq: Two times a day (BID) | INTRAVENOUS | Status: DC
  Administered 2020-04-28 – 2020-05-04 (×14): 1000 mg via INTRAVENOUS
  Filled 2020-04-28 (×16): qty 10

## 2020-04-28 MED ORDER — DILTIAZEM HCL 30 MG PO TABS: 30.0000 mg | ORAL_TABLET | Freq: Four times a day (QID) | ORAL | Status: DC

## 2020-04-28 MED ORDER — SODIUM BICARBONATE 650 MG PO TABS
1300.0000 mg | ORAL_TABLET | Freq: Three times a day (TID) | ORAL | Status: DC
  Administered 2020-04-28 – 2020-05-04 (×18): 1300 mg
  Filled 2020-04-28 (×20): qty 2

## 2020-04-28 MED ORDER — POTASSIUM CHLORIDE 20 MEQ PO PACK
40.0000 meq | PACK | Freq: Once | ORAL | Status: AC
  Administered 2020-04-28: 40 meq
  Filled 2020-04-28: qty 2

## 2020-04-28 NOTE — Progress Notes (Signed)
LTM maintenance completed; reprepped under A2, P7, O1, Cz, P3, and P4. No skin breakdown was seen.

## 2020-04-28 NOTE — Procedures (Addendum)
Patient Name: Sarah Colon  MRN: [id number]  Epilepsy Attending: Lora Havens  Referring Physician/Provider: Dr Roland Rack Duration: 04/28/2020 0231 to 04/29/2020 0231  Patient history: 74 yo F with encephalopathy in the setting of VRE UTI.Stat eeg consistent with status epilepticus. EEG to evaluate for seizure.  Level of alertness: Awake, asleep  AEDs during EEG study: LEV, VPA  Technical aspects: This EEG study was done with scalp electrodes positioned according to the 10-20 International system of electrode placement. Electrical activity was acquired at a sampling rate of 500Hz  and reviewed with a high frequency filter of 70Hz  and a low frequency filter of 1Hz . EEG data were recorded continuously and digitally stored.   Description: No clear posterior dominant rhythm was seen. Sleep was characterized by vertex waves, sleep spindles (12 to 14 Hz), maximal frontocentral region.  EEG showed continuous generalized 3 to 6 Hz theta-delta slowing. Periodic discharges were triphasic morphology at 1hz  were noted. When patient was stimulated, the periodic discharges appear more frequent at 2 Hz and appeared rhythmic concerning for stimulus induced rhythmic periodic ictal-interictal discharges ( SIRPIDS).   ABNORMALITY -Periodic Epileptiform discharges with triphasic morphology, generalized -Stimulation induced rhythmic periodic ictal hyper interictal discharges (SIRPIDS) -Continuous slow, generalized  IMPRESSION: This study showed evidence of generalized epileptogenicity. There were periodic epileptiform discharges with triphasic morphology which appeared rhythmic after stimulation and at frequency of 2 Hz which is on the ictal pattern interictal continuum with high potential for seizures.  However, no significant change in SIRPIDS was noted after titration of AEDs which suggests interictal nature. Additionally there was evidence of moderate to severe diffuse encephalopathy,  nonspecific etiology.   Sanjana Folz Barbra Sarks

## 2020-04-28 NOTE — Plan of Care (Signed)
  Problem: Health Behavior/Discharge Planning: Goal: Ability to manage health-related needs will improve 04/28/2020 2349 by Lorraine Lax, RN Outcome: Progressing 04/28/2020 2348 by Lorraine Lax, RN Outcome: Progressing   Problem: Clinical Measurements: Goal: Ability to maintain clinical measurements within normal limits will improve 04/28/2020 2349 by Lorraine Lax, RN Outcome: Progressing 04/28/2020 2348 by Lorraine Lax, RN Outcome: Progressing Goal: Will remain free from infection 04/28/2020 2349 by Lorraine Lax, RN Outcome: Progressing 04/28/2020 2348 by Lorraine Lax, RN Outcome: Progressing Goal: Diagnostic test results will improve 04/28/2020 2349 by Lorraine Lax, RN Outcome: Progressing 04/28/2020 2348 by Lorraine Lax, RN Outcome: Progressing Goal: Cardiovascular complication will be avoided 04/28/2020 2349 by Lorraine Lax, RN Outcome: Progressing 04/28/2020 2348 by Lorraine Lax, RN Outcome: Progressing   Problem: Activity: Goal: Risk for activity intolerance will decrease 04/28/2020 2349 by Lorraine Lax, RN Outcome: Progressing 04/28/2020 2348 by Lorraine Lax, RN Outcome: Progressing   Problem: Nutrition: Goal: Adequate nutrition will be maintained 04/28/2020 2349 by Lorraine Lax, RN Outcome: Progressing 04/28/2020 2348 by Lorraine Lax, RN Outcome: Progressing   Problem: Coping: Goal: Level of anxiety will decrease 04/28/2020 2349 by Lorraine Lax, RN Outcome: Progressing 04/28/2020 2348 by Lorraine Lax, RN Outcome: Progressing   Problem: Elimination: Goal: Will not experience complications related to bowel motility 04/28/2020 2349 by Lorraine Lax, RN Outcome: Progressing 04/28/2020 2348 by Lorraine Lax, RN Outcome: Progressing Goal: Will not experience complications related to urinary retention 04/28/2020 2349 by Lorraine Lax, RN Outcome:  Progressing 04/28/2020 2348 by Lorraine Lax, RN Outcome: Progressing   Problem: Safety: Goal: Ability to remain free from injury will improve 04/28/2020 2349 by Lorraine Lax, RN Outcome: Progressing 04/28/2020 2348 by Lorraine Lax, RN Outcome: Progressing   Problem: Skin Integrity: Goal: Risk for impaired skin integrity will decrease 04/28/2020 2349 by Lorraine Lax, RN Outcome: Progressing 04/28/2020 2348 by Lorraine Lax, RN Outcome: Progressing   Problem: Education: Goal: Knowledge of General Education information will improve Description: Including pain rating scale, medication(s)/side effects and non-pharmacologic comfort measures Outcome: Progressing   Problem: Health Behavior/Discharge Planning: Goal: Ability to manage health-related needs will improve Outcome: Progressing   Problem: Clinical Measurements: Goal: Ability to maintain clinical measurements within normal limits will improve Outcome: Progressing Goal: Will remain free from infection Outcome: Progressing Goal: Diagnostic test results will improve Outcome: Progressing Goal: Respiratory complications will improve Outcome: Progressing Goal: Cardiovascular complication will be avoided Outcome: Progressing   Problem: Activity: Goal: Risk for activity intolerance will decrease Outcome: Progressing   Problem: Nutrition: Goal: Adequate nutrition will be maintained Outcome: Progressing   Problem: Coping: Goal: Level of anxiety will decrease Outcome: Progressing   Problem: Elimination: Goal: Will not experience complications related to bowel motility Outcome: Progressing Goal: Will not experience complications related to urinary retention Outcome: Progressing   Problem: Pain Managment: Goal: General experience of comfort will improve Outcome: Progressing   Problem: Safety: Goal: Ability to remain free from injury will improve Outcome: Progressing   Problem: Skin  Integrity: Goal: Risk for impaired skin integrity will decrease Outcome: Progressing

## 2020-04-28 NOTE — Progress Notes (Signed)
[employer] [facility]) Community Based Palliative Care  This patient has been referred to our palliative care services in the community.  [facility] will continue to follow for any discharge planning needs and to coordinate admission onto palliative care.  If you have questions or need assistance, please call [phone] or contact the hospital Liaison listed on AMION.  Gar Ponto, RN [facility] Liaison [phone]

## 2020-04-28 NOTE — Progress Notes (Signed)
[place] for Infectious Disease  Date of Admission:  03/18/2020      Reason for visit: Follow up on discitis  ASSESSMENT:    74 yo woman with multiple medical problems previously seen at 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  S/p aspiration of disc space with negative cultures.  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  Her antibiotics were changed from cefepime to pip-tazo.  Daptomycin was continued.    PLAN:    -- continue dapto and pip tazo for now -- tentative plan for 6 weeks total.  End date 06/04/20 -- will follow peripherally.   MEDICATIONS:    Scheduled Meds: . [START ON 04/29/2020] allopurinol  100 mg Per Tube Daily  . apixaban  2.5 mg Per Tube BID  . Chlorhexidine Gluconate Cloth  6 each Topical Daily  . [START ON 04/29/2020] colchicine  0.3 mg Per Tube QODAY  . diclofenac Sodium  2 g Topical QID  . diltiazem  30 mg Per Tube Q6H  . feeding supplement (PROSource TF)  45 mL Per Tube BID  . [START ON 04/29/2020] ferrous sulfate  325 mg Per Tube Q breakfast  . furosemide  20 mg Per Tube BID  . lactulose  10 g Per Tube BID  . [START ON 04/29/2020] levothyroxine  88 mcg Per Tube Q0600  . metoprolol tartrate  25 mg Per Tube BID  . pantoprazole (PROTONIX) IV  40 mg Intravenous QHS  . potassium chloride  40 mEq Per Tube Once  . sodium bicarbonate  1,300 mg Per Tube TID  . sodium chloride flush  10-40 mL Intracatheter Q12H  . sodium chloride flush  3 mL Intravenous Q12H    Continuous Infusions: . sodium chloride Stopped (04/01/20 0906)  . DAPTOmycin (CUBICIN)  IV Stopped (04/27/20 1944)  . feeding supplement (OSMOLITE 1.5 CAL) 1,000 mL (04/28/20 1448)  . levETIRAcetam Stopped (04/28/20 0924)  . piperacillin-tazobactam (ZOSYN)  IV    . valproate sodium Stopped (04/28/20 1037)    PRN Meds: sodium chloride, acetaminophen (TYLENOL)  oral liquid 160 mg/5 mL, diphenhydrAMINE, levalbuterol, ondansetron (ZOFRAN) IV, polyethylene glycol, sodium chloride flush, sodium chloride flush  SUBJECTIVE:   Patient is awake but unable to obtain further history from her.    OBJECTIVE:   Allergies  Allergen Reactions  . Codeine Other (See Comments)    Swelling    Blood pressure (!) 156/110, pulse 82, temperature (!) 97.4 F (36.3 C), temperature source Axillary, resp. rate 17, height 5\' 1"  (1.549 m), weight 65.1 kg, SpO2 98 %. Body mass index is 27.12 kg/m.  Physical Exam  General: Chronically ill-appearing female, lying on the bed HEENT: [place]/AT.   Moist mucous membranes Neuro: Opens eyes with vocal stimuli, not following commands.  Chest: RRR Lungs: Diminished at the bases.  Heart: Regular rate and rhythm, no murmurs or gallops Abdomen: Soft, nontender, nondistended Skin: No rash   Lab Results & Microbiology Lab Results  Component Value Date   WBC 12.8 (H) 04/28/2020   HGB 8.1 (L) 04/28/2020   HCT 25.5 (L) 04/28/2020   MCV 78.9 (L) 04/28/2020   PLT 278 04/28/2020    Lab Results  Component Value Date   NA 145 04/28/2020   K 3.1 (L) 04/28/2020   CO2 28 04/28/2020   GLUCOSE 86 04/28/2020   BUN 23 04/28/2020   CREATININE 2.26 (H) 04/28/2020  CALCIUM 8.2 (L) 04/28/2020   GFRNONAA 22 (L) 04/28/2020   GFRAA 40 (L) 12/25/2019    Lab Results  Component Value Date   ALT 13 04/26/2020   AST 38 04/26/2020   ALKPHOS 172 (H) 04/26/2020   BILITOT 0.8 04/26/2020     I have reviewed the micro and lab results in Epic.  Imaging EEG  Result Date: 04/27/2020 Sarah Doom, MD     04/27/2020  3:18 AM Patient Name: Sarah Colon MRN: [id number] EEG Attending: Roland Rack Referring Physician/Provider: Roland Rack Date: 04/27/2020 Duration: 22 minutes Patient history: 74 year old female being evaluated for acute mental status change Level of alertness: Awake AEDs during EEG study: Technical  aspects: This EEG study was done with scalp electrodes positioned according to the 10-20 International system of electrode placement. Electrical activity was acquired at a sampling rate of 500Hz  and reviewed with a high frequency filter of 70Hz  and a low frequency filter of 1Hz . EEG data were recorded continuously and digitally stored. BACKGROUND ACTIVITY: The background is dominated by frequent 2.5 to 3 Hz frontally predominant sharp wave discharges.  This sometimes gives way to a 2 to 3-second run of 5 to 6 Hz generalized theta followed by resumption of the 2 to 3 Hz sharp wave discharges.  There is some waxing and waning in amplitude and morphology, but no clear frequency evolution.  Following Ativan administration, this pattern resolves and the background is dominated by irregular slow activity, predominantly theta range. SLEEP RECORDINGS: No clear sleep structures were seen. ACTIVATION PROCEDURES: Hyperventilation and photic stimulation were not performed. 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 Roland Rack, MD [facility] [phone] If 7pm- 7am, please page neurology on call as listed in [place].   Overnight EEG with video  Result Date: 04/27/2020 Sarah Havens, MD     04/28/2020  9:59 AM Patient Name: Sarah Colon MRN: [id number] Epilepsy Attending: Lora Colon Referring Physician/Provider: Dr Roland Rack Duration: 04/27/2020 0231 to 04/28/2020 0231 Patient history: 74 yo F with encephalopathy in the setting of VRE UTI.  Stat eeg consistent with status epilepticus. EEG to evaluate for seizure. Level of alertness: Awake, asleep AEDs during EEG study: LEV, VPA Technical aspects: This EEG study was done with scalp electrodes positioned according to the 10-20 International system of electrode placement. Electrical activity was acquired at a sampling rate of 500Hz  and reviewed with a high  frequency filter of 70Hz  and a low frequency filter of 1Hz . EEG data were recorded continuously and digitally stored. Description: No clear posterior dominant rhythm was seen. Sleep was characterized by vertex waves, sleep spindles (12 to 14 Hz), maximal frontocentral region.  EEG showed continuous generalized 3 to 6 Hz theta-delta slowing. Sharp waves and spikes were noted in bifrontal L>R region. Periodic discharges were triphasic morphology were also noted, at times rhythmic.  After around 0755 on 04/27/2020, eeg showed generalized periodic discharges with triphasic morphology at 1hz  which gradually appeared quasi rhythmic and had evolution in morphology and frequency. Per eeg tech, patient was noted to have episodes if right arm twitching which were difficult to visualize on camera.  Patient's AEDs were adjusted. However when patient was stimulated, the periodic discharges became more frequent at 2 Hz and appeared rhythmic with some evolution and morphology as well concerning for stimulus induced rhythmic periodic ictal-interictal discharges ( SIRPIDS).  At times patient was noted to have subtle side-to-side head movements during these EEG changes. ABNORMALITY -  Sharp waves, bifrontal, left more than right - Periodic Epileptiform discharges with triphasic morphology, generalized -Stimulation induced rhythmic periodic ictal hyper interictal discharges (SIRPIDS) -Continuous slow, generalized IMPRESSION: This study showed evidence of epileptogenicity arising from left more than right frontal region. There were also periodic epileptiform discharges with triphasic morphology which appeared rhythmic after stimulation and at frequency of 2 Hz which is on the ictal pattern interictal continuum with high potential for seizures.  However, no significant change in SIRPIDS was noted after titration of AEDs which suggests interictal nature. Additionally there was evidence of moderate to severe diffuse encephalopathy,  nonspecific etiology. [facility] [phone] pager 04/28/2020, 4:40 PM

## 2020-04-28 NOTE — Plan of Care (Signed)
  Problem: Health Behavior/Discharge Planning: Goal: Ability to manage health-related needs will improve Outcome: Progressing   Problem: Clinical Measurements: Goal: Ability to maintain clinical measurements within normal limits will improve Outcome: Progressing Goal: Will remain free from infection Outcome: Progressing Goal: Diagnostic test results will improve Outcome: Progressing Goal: Cardiovascular complication will be avoided Outcome: Progressing   Problem: Activity: Goal: Risk for activity intolerance will decrease Outcome: Progressing   Problem: Nutrition: Goal: Adequate nutrition will be maintained Outcome: Progressing   Problem: Coping: Goal: Level of anxiety will decrease Outcome: Progressing   Problem: Elimination: Goal: Will not experience complications related to bowel motility Outcome: Progressing Goal: Will not experience complications related to urinary retention Outcome: Progressing   Problem: Safety: Goal: Ability to remain free from injury will improve Outcome: Progressing   Problem: Skin Integrity: Goal: Risk for impaired skin integrity will decrease Outcome: Progressing

## 2020-04-28 NOTE — Progress Notes (Signed)
Pharmacy Antibiotic Note  Sarah Colon is a 74 y.o. female admitted on 03/22/2020 with L4-L5 discitis/osteomyelitis and is s/p disc aspiration on 11/23. The patient has CKD with current Scr at 2.26 and CrCl ~18 mL/min. Pharmacy has been consulted for daptomycin and Zosyn dosing.  Plan: Continue daptomycin IV 500 mg q48h Monitor weekly CK on Mondays, starting 11/30 Continue Zosyn IV 3.375 g q12h (4-hour extended infusion) Monitor renal function and adjust dosing as needed Plan for 6 weeks of therapy per infectious disease, following recomendations  Height: 5\' 1"  (154.9 cm) Weight: 65.1 kg (143 lb 8.3 oz) IBW/kg (Calculated) : 47.8  Temp (24hrs), Avg:98.3 F (36.8 C), Min:95.9 F (35.5 C), Max:100.3 F (37.9 C)  Recent Labs  Lab 04/22/20 0832 04/23/20 0520 04/26/20 0446 04/27/20 0908 04/28/20 0326 04/28/20 0459  WBC 17.7* 19.5* 16.3* 15.2*  --  12.8*  CREATININE 2.57* 2.42* 2.08* 2.26* 2.19*  --     Estimated Creatinine Clearance: 18.9 mL/min (A) (by C-G formula based on SCr of 2.19 mg/dL (H)).    Allergies  Allergen Reactions  . Codeine Other (See Comments)    Swelling    Antimicrobials this admission: 10/31 Rifaximin >> 11/21 11/5 Unasyn >> 11/6 11/6 Meropenem >> 11/7 11/7 Zosyn >> 11/12; resumed 11/27>> 11/24 Daptomycin >> 11/24 Cefepime >> 11/27  Dose adjustments this admission: 11/29: Zosyn IV 3.375 g q8h >> Zosyn IV 3.375 g q12h (pts CrCl dropped <20 mL/min)  Microbiology results:  11/27 UCx NGTD 11/23 L4/L5 abscess aspiration: NGTD 11/19 Respiratory panel: NGTD 11/5 BCx: NGTD 11/5 UCx: 70K Providencia stuartii, >100K VRE, susceptibile to cefepime/Zosyn 11/1 MRSA PCR: negative 10/30 Respiratory panel: NGTD   Thank you for allowing pharmacy to be a part of this patient's care.   Shauna Hugh, PharmD, [place]  PGY-1 Pharmacy Resident 04/28/2020 3:03 PM  Please check [email or web address] for unit-specific pharmacy phone numbers.

## 2020-04-28 NOTE — Progress Notes (Signed)
Initial Nutrition Assessment  DOCUMENTATION CODES:   Non-severe (moderate) malnutrition in context of chronic illness  INTERVENTION:   Initiate tube feeds via Cortrak: - Start Osmolite 1.5 @ 20 ml/hr and advance by 10 ml q 4 hours to goal rate of 50 ml/hr (1200 ml/day) - ProSource TF 45 ml BID  Tube feeding regimen at goal provides 1880 kcal, 97 grams of protein, and 914 ml of H2O.   Monitor magnesium, potassium, and phosphorus daily for at least 3 days, MD to replete as needed, as pt is at risk for refeeding syndrome given malnutrition, variable PO intake during admission, hypokalemia.  NUTRITION DIAGNOSIS:   Moderate Malnutrition related to chronic illness (CHF) as evidenced by mild fat depletion, moderate muscle depletion.  GOAL:   Patient will meet greater than or equal to 90% of their needs  MONITOR:   Diet advancement, Labs, Weight trends, TF tolerance, Skin, I & O's  REASON FOR ASSESSMENT:   LOS, Consult Enteral/tube feeding initiation and management  ASSESSMENT:   74 year old female with PMH of T2DM, CHF, HTN, atrial fibrillation who initially presented to [facility] from SNF with SOB, chest pain, and fatigue. Pt with poor appetite and was found to have a hemoglobin of 3 on admission. FOBT was positive in the ED and GI was consulted. Pt underwent EGD and colonoscopy without evidence of bleeding, but multiple polypectomies were performed. During the hospital course, pt was treated for right lung base pneumonia and UTI. Additionally, pt a CT abdomen/pelvis on 04/04/20 with presacral and rectal findings associated with cortical erosion, sclerosis of unclear etiology. MRI L-spine consistent with discitis/osteomyelitis. Pt underwent IR guided aspiration of L4-5 disc with cultures showing no growth. Pt found to be unresponsive with status epilepticus and wwas transferred to [facility] for neurology evaluation and continuous EEG on 11/28.   11/29 - Cortrak placed, tip gastric per Cortrak  team  Discussed pt with RN and during ICU rounds. Pt currently NPO and has been NPO since 11/27 at 2206. Prior to this, pt on a regular diet from 11/23-11/27. Meal completions 0-100% per flowsheets.  Cortrak placed today by Cortrak team.  Unable to obtain diet and weight history from pt at this time. Pt was lethargic at time of RD visit.  Admit weight: 60.9 kg Current weight: 65.1 kg  Pt with edema to BUE and BLE. Current weight likely falsely elevated related to edema. Difficult to determine dry weight but suspect pt has had some weight loss during 30 day hospital admission with variable PO intake.  Meal Completion: 0-100% (prior to NPO)  Medications reviewed and include: ferrous sulfate, lasix, lactulose, protonix, sodium bicarb, IV abx  Labs reviewed: potassium 3.1, BUN 27, creatinine 2.19, magnesium 1.5 on 11/28, hemoglobin 8.1 CBG's: 72-85 x 24 hours  NUTRITION - FOCUSED PHYSICAL EXAM:    Most Recent Value  Orbital Region Mild depletion  Upper Arm Region Mild depletion  Thoracic and Lumbar Region Mild depletion  Buccal Region Mild depletion  Temple Region Moderate depletion  Clavicle Bone Region Moderate depletion  Clavicle and Acromion Bone Region Moderate depletion  Scapular Bone Region Unable to assess  Dorsal Hand Mild depletion  Patellar Region Mild depletion  Anterior Thigh Region Moderate depletion  Posterior Calf Region Mild depletion  Edema (RD Assessment) Moderate  [BUE, BLE]  Hair Reviewed  Eyes Unable to assess  Mouth Reviewed  Skin Reviewed  Nails Reviewed       Diet Order:   Diet Order  Diet NPO time specified  Diet effective now           Diet - low sodium heart healthy                 EDUCATION NEEDS:   Not appropriate for education at this time  Skin:  Skin Assessment: Skin Integrity Issues: Stage III: sacrum  Last BM:  04/28/20 small type 6  Height:   Ht Readings from Last 1 Encounters:  04/26/20 5\' 1"  (1.549 m)     Weight:   Wt Readings from Last 1 Encounters:  04/28/20 65.1 kg    BMI:  Body mass index is 27.12 kg/m.  Estimated Nutritional Needs:   Kcal:  1700-1900  Protein:  85-100 grams  Fluid:  1.7-1.9 L    Gustavus Bryant, MS, RD, LDN Inpatient Clinical Dietitian Please see AMiON for contact information.

## 2020-04-28 NOTE — Progress Notes (Signed)
Subjective: No clinical seizures.  Afebrile.  More verbal today.  ROS: Unable to obtain due to poor mental status  Examination  Vital signs in last 24 hours: Temp:  [95.9 F (35.5 C)-100.3 F (37.9 C)] 98.3 F (36.8 C) (11/29 0700) Pulse Rate:  [59-107] 77 (11/29 1000) Resp:  [16-35] 27 (11/29 1000) BP: (100-196)/(46-156) 165/83 (11/29 1000) SpO2:  [93 %-100 %] 97 % (11/29 1000) Weight:  [65.1 kg] 65.1 kg (11/29 0326)  General: lying in bed, not in apparent distress CVS: pulse-normal rate and rhythm RS: breathing comfortably, CTA B Extremities: normal, warm  Neuro: Lethargic, opens eyes to noxious stimuli, states: Stop doing that when stimulated", does not follow commands, PERRLA, no forced gaze deviation, no apparent facial asymmetry, moving all 4 extremities is noxious stimuli  Basic Metabolic Panel: Recent Labs  Lab 04/22/20 0832 04/22/20 0832 04/23/20 0520 04/26/20 0446 04/27/20 0908 04/28/20 0326  NA 142  --  144 146* 144  --   K 3.7  --  3.8 3.3* 3.1*  --   CL 108  --  109 106 100  --   CO2 25  --  25 26 27   --   GLUCOSE 117*  --  113* 93 127*  --   BUN 32*  --  35* 32* 27*  --   CREATININE 2.57*  --  2.42* 2.08* 2.26* 2.19*  CALCIUM 8.5*   < > 8.3* 8.3* 8.1*  --   MG  --   --   --  1.7 1.5*  --   PHOS  --   --   --  4.0  --   --    < > = values in this interval not displayed.    CBC: Recent Labs  Lab 04/22/20 0832 04/23/20 0520 04/26/20 0446 04/27/20 0908 04/28/20 0459  WBC 17.7* 19.5* 16.3* 15.2* 12.8*  NEUTROABS  --   --  11.4*  --   --   HGB 8.8* 8.0* 8.4* 8.2* 8.1*  HCT 26.9* 25.0* 26.1* 26.4* 25.5*  MCV 76.9* 77.6* 78.4* 79.8* 78.9*  PLT 469* 387 311 292 278     Coagulation Studies: No results for input(s): LABPROT, INR in the last 72 hours.  Imaging MRI brain without contrast 11/272021:  No acute abnormality.  ASSESSMENT AND PLAN: 74 year old female with history of A. fib, congestive heart failure, hypertension, CKD, type 2 diabetes  who presented with worsening altered mental status in setting of anemia with hemoglobin 3 as well as concern for discitis/osteomyelitis at L4-L5.  Nonconvulsive status epilepticus (improved) Atrial fibrillation Diastolic congestive heart failure Diabetes Hypertension AKI on CKD stage IV Suspected cephalosporin toxicity Providentia and VRE UTI Cirrhosis, new diagnosis (POA) Symptomatic anemia -Routine EEG was concerning for nonconvulsive status epilepticus which has since improved after adding Keppra and Depakote. -LTM EEG now showing SIRPIDS which has not significantly changed after adjusting AEDs and therefore is most likely interictal.  Recommendations - SIRPIDS most likely secondary to cephalosporin toxicity and infection in the setting of renal dysfunction -Continue current dose of Keppra and Depakote -Continue LTM EEG monitoring for another 24 hours, if patient remained seizure free will discontinue tomorrow -If patient has any further clinical seizures, will add Vimpat -No suspicion for viral meningitis, also patient already on broad-spectrum antibiotics.  Therefore, will defer LP at this point -Seizure precautions -as needed IV Ativan 2 mg for clinical seizures lasting more than 2 minutes -Management of rest of comorbidities per primary team  CRITICAL CARE Performed by: Marcelle Overlie  Barbra Sarks   Total critical care time: 35 minutes  Critical care time was exclusive of separately billable procedures and treating other patients.  Critical care was necessary to treat or prevent imminent or life-threatening deterioration.  Critical care was time spent personally by me on the following activities: development of treatment plan with patient and/or surrogate as well as nursing, discussions with consultants, evaluation of patient's response to treatment, examination of patient, obtaining history from patient or surrogate, ordering and performing treatments and interventions, ordering and  review of laboratory studies, ordering and review of radiographic studies, pulse oximetry and re-evaluation of patient's condition.  Zeb Comfort Epilepsy [facility] For questions after 5pm please refer to AMION to reach the Neurologist on call

## 2020-04-28 NOTE — Procedures (Signed)
Cortrak  Person Inserting Tube:  Maylon Peppers C, RD Tube Type:  Cortrak - 43 inches Tube Location:  Right nare Initial Placement:  Stomach Secured by: Bridle Technique Used to Measure Tube Placement:  Documented cm marking at nare/ corner of mouth Cortrak Secured At:  63 cm    Cortrak Tube Team Note:  Consult received to place a Cortrak feeding tube.   No x-ray is required. RN may begin using tube.   If the tube becomes dislodged please keep the tube and contact the Cortrak team at [email or web address] ([email or web address]) for replacement.  If after hours and replacement cannot be delayed, place a NG tube and confirm placement with an abdominal x-ray.    Lockie Pares., RD, LDN, CNSC See AMiON for contact information

## 2020-04-28 NOTE — Progress Notes (Signed)
EEG with recurrent epileptiform discharges, loading with additional depacon, and giving ativan.   Roland Rack, MD [facility] [phone]  If 7pm- 7am, please page neurology on call as listed in [place].

## 2020-04-28 NOTE — Progress Notes (Signed)
NAME:  Sarah Colon, MRN:  [id number], DOB:  May 28, 1946, LOS: 13 ADMISSION DATE:  03/27/2020, CONSULTATION DATE: 04/27/2020 REFERRING MD: Dr.Khaliqdina, CHIEF COMPLAINT: Recurrent seizures  Brief History   74 year old female with chronic A. fib, CKD and diabetes presented with unresponsiveness found to be in nonconvulsive status epilepticus.  PCCM was consulted for help with management  History of present illness   74 year old female with diabetes type 2, diastolic congestive heart failure, hypertension and proximal A. fib who initially presented with generalized weakness, noted to have hemoglobin of 3.  Also there was a concern of discitis/osteomyelitis at L4-L5, was started on daptomycin and cefepime per ID recommendations, noted to have worsening mental status, neurology was consulted, patient had EEG done which showed nonconvulsive status epilepticus, patient was given Ativan and then loaded with Keppra but she continued to have altered mental status and intermittent seizures.  Cefepime was switched to IV Zosyn.  Critical care was consulted for evaluation and management of airway considering patient is in status epilepticus.  Past Medical History  Diabetes type 2 Hypertension CKD stage IV Proximal A. fib Diastolic congestive heart failure  Significant Hospital Events   Diagnosed with discitis/osteomyelitis of L4-L5  Consults:  Infectious disease Neurology PCCM  Procedures:  IR guided aspiration of LP/L5 disc  Significant Diagnostic Tests:  cEEG 11/29 - Concering for SIRPIDS   Micro Data:  11/5 urine culture MDR procidentia and VRE  Antimicrobials:  cefepime 11/24>>11/27 Daptomycin 11/24 >>  Zosyn 11/28 >>  Interim history/subjective:  Patient is awake. Unable to obtain history from patient. No acute events overnight per nursing.   Objective   Blood pressure (!) 161/65, pulse 65, temperature 98.3 F (36.8 C), temperature source Axillary, resp. rate (!) 23, height  5\' 1"  (1.549 m), weight 65.1 kg, SpO2 97 %.        Intake/Output Summary (Last 24 hours) at 04/28/2020 1156 Last data filed at 04/28/2020 1100 Gross per 24 hour  Intake 2083.93 ml  Output --  Net 2083.93 ml   Filed Weights   04/26/20 2352 04/27/20 0444 04/28/20 0326  Weight: 68.2 kg 68.2 kg 65.1 kg    Examination:   Physical exam: General: Chronically ill-appearing female, lying on the bed HEENT: [place]/AT, eyes anicteric.    Moist mucous membranes Neuro: Opens eyes with vocal stimuli, not following commands.  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 Fib Diastolic congestive heart failure Hypertension Diabetes type 2  Patient presented with altered mental status, noted to be in a status epilepticus on spot EEG in the setting of being on cefepime. cEEG showing signs of SIRPIDS again in the setting of cefepime.  Neurology is following and recommends continuing keppra and depakote. cEEG to continue for the next 24 hours.  continue daptomycin Monitor serum creatinine Patient's H&H is stable now, transfuse if less than 7 Continue iron supplements Patient is on antibiotics for UTI Continue supplement electrolytes and follow-up Patient's heart rate is well controlled, she is in sinus rhythm Continue apixaban twice daily for stroke prophylaxis Continue diltiazem Continue insulin with  fingerstick goal of 140-180 Will place a doboff feeding tube today to start TF  Best practice  (evaluated daily)   Diet: TF Pain/Anxiety/Delirium protocol (if indicated): N/A VAP protocol (if indicated): N/A DVT prophylaxis: Apixaban GI prophylaxis: Protonix Glucose control: SSI Mobility: Bedrest last date of multidisciplinary goals of care discussion 04/27/2020 Family and staff present patient's RN Summary of discussion spoke with patient's daughter over the phone, she requested to keep patient on DNR status but intubation is okay in case if her seizures are not well controlled and she is not protecting her airway Follow up goals of care discussion due 05/03/2020 Code Status: DNR Disposition: ICU  Labs   CBC: Recent Labs  Lab 04/22/20 0832 04/23/20 0520 04/26/20 0446 04/27/20 0908 04/28/20 0459  WBC 17.7* 19.5* 16.3* 15.2* 12.8*  NEUTROABS  --   --  11.4*  --   --   HGB 8.8* 8.0* 8.4* 8.2* 8.1*  HCT 26.9* 25.0* 26.1* 26.4* 25.5*  MCV 76.9* 77.6* 78.4* 79.8* 78.9*  PLT 469* 387 311 292 517    Basic Metabolic Panel: Recent Labs  Lab 04/22/20 0832 04/23/20 0520 04/26/20 0446 04/27/20 0908 04/28/20 0326  NA 142 144 146* 144  --   K 3.7 3.8 3.3* 3.1*  --   CL 108 109 106 100  --   CO2 25 25 26 27   --   GLUCOSE 117* 113* 93 127*  --   BUN 32* 35* 32* 27*  --   CREATININE 2.57* 2.42* 2.08* 2.26* 2.19*  CALCIUM 8.5* 8.3* 8.3* 8.1*  --   MG  --   --  1.7 1.5*  --   PHOS  --   --  4.0  --   --    GFR: Estimated Creatinine Clearance: 18.9 mL/min (A) (by C-G formula based on SCr of 2.19 mg/dL (H)). Recent Labs  Lab 04/23/20 0520 04/26/20 0446 04/27/20 0908 04/28/20 0459  WBC 19.5* 16.3* 15.2* 12.8*    Liver Function Tests: Recent Labs  Lab 04/22/20 0832 04/23/20 0520 04/26/20 0446  AST 53* 43* 38  ALT 17 16 13   ALKPHOS 221* 200* 172*  BILITOT 0.7 0.7 0.8  PROT 6.9 6.3* 6.5  ALBUMIN 2.6* 2.3* 2.2*   No results for input(s): LIPASE, AMYLASE in the last 168 hours. Recent Labs  Lab 04/26/20 0709  AMMONIA 21    ABG    Component Value  Date/Time   HCO3 28.4 (H) 04/26/2020 1020   TCO2 24 04/07/2013 2010   O2SAT 67.9 04/26/2020 1020     Coagulation Profile: Recent Labs  Lab 04/22/20 0832  INR 1.5*    Cardiac Enzymes: No results for input(s): CKTOTAL, CKMB, CKMBINDEX, TROPONINI in the last 168 hours.  HbA1C: Hgb A1c MFr Bld  Date/Time Value Ref Range Status  03/30/2020 05:00 AM 5.6 4.8 - 5.6 % Final    Comment:    (NOTE) Pre diabetes:          5.7%-6.4%  Diabetes:              >6.4%  Glycemic control for   <7.0% adults with diabetes   12/09/2019 05:48 AM 7.3 (H) 4.8 - 5.6 % Final    Comment:    (NOTE) Pre diabetes:          5.7%-6.4%  Diabetes:              >6.4%  Glycemic control for   <7.0% adults with diabetes     CBG: Recent Labs  Lab 04/26/20 2139 04/27/20 0612 04/27/20 1523 04/28/20 0517 04/28/20 0738  GLUCAP 99 106* 83 72 76   Critical care time 35 minutes  Freda Jackson, MD [facility] Office: [phone]   See Amion for Pager Details

## 2020-04-29 DIAGNOSIS — E440 Moderate protein-calorie malnutrition: Secondary | ICD-10-CM | POA: Insufficient documentation

## 2020-04-29 DIAGNOSIS — G9341 Metabolic encephalopathy: Secondary | ICD-10-CM | POA: Diagnosis not present

## 2020-04-29 LAB — MAGNESIUM: Magnesium: 2 mg/dL (ref 1.7–2.4)

## 2020-04-29 LAB — BASIC METABOLIC PANEL
Anion gap: 8 (ref 5–15)
BUN: 23 mg/dL (ref 8–23)
CO2: 27 mmol/L (ref 22–32)
Calcium: 7.8 mg/dL — ABNORMAL LOW (ref 8.9–10.3)
Chloride: 106 mmol/L (ref 98–111)
Creatinine, Ser: 2.26 mg/dL — ABNORMAL HIGH (ref 0.44–1.00)
GFR, Estimated: 22 mL/min — ABNORMAL LOW (ref >60)
Glucose, Bld: 143 mg/dL — ABNORMAL HIGH (ref 70–99)
Potassium: 3.5 mmol/L (ref 3.5–5.1)
Sodium: 141 mmol/L (ref 135–145)

## 2020-04-29 LAB — GLUCOSE, CAPILLARY
Glucose-Capillary: 129 mg/dL — ABNORMAL HIGH (ref 70–99)
Glucose-Capillary: 172 mg/dL — ABNORMAL HIGH (ref 70–99)
Glucose-Capillary: 181 mg/dL — ABNORMAL HIGH (ref 70–99)
Glucose-Capillary: 152 mg/dL — ABNORMAL HIGH (ref 70–99)
Glucose-Capillary: 160 mg/dL — ABNORMAL HIGH (ref 70–99)
Glucose-Capillary: 189 mg/dL — ABNORMAL HIGH (ref 70–99)

## 2020-04-29 LAB — AMMONIA: Ammonia: 37 umol/L — ABNORMAL HIGH (ref 9–35)

## 2020-04-29 LAB — CK: Total CK: 20 U/L — ABNORMAL LOW (ref 38–234)

## 2020-04-29 MED ORDER — PANTOPRAZOLE SODIUM 40 MG PO PACK
40.0000 mg | PACK | Freq: Every day | ORAL | Status: DC
  Administered 2020-04-29 – 2020-05-03 (×5): 40 mg
  Filled 2020-04-29 (×6): qty 20

## 2020-04-29 MED ORDER — HYDRALAZINE HCL 50 MG PO TABS
50.0000 mg | ORAL_TABLET | Freq: Three times a day (TID) | ORAL | Status: DC
  Administered 2020-04-29 – 2020-05-04 (×15): 50 mg
  Filled 2020-04-29 (×15): qty 1

## 2020-04-29 MED ORDER — METOPROLOL TARTRATE 25 MG PO TABS: 25.0000 mg | ORAL_TABLET | Freq: Once | ORAL | Status: DC

## 2020-04-29 MED ORDER — METOPROLOL TARTRATE 25 MG PO TABS
25.0000 mg | ORAL_TABLET | Freq: Once | ORAL | Status: AC
  Administered 2020-04-29: 25 mg
  Filled 2020-04-29: qty 1

## 2020-04-29 MED ORDER — METOPROLOL TARTRATE 50 MG PO TABS
50.0000 mg | ORAL_TABLET | Freq: Two times a day (BID) | ORAL | Status: DC
  Administered 2020-04-30 – 2020-05-04 (×9): 50 mg
  Filled 2020-04-29 (×9): qty 1

## 2020-04-29 MED ORDER — POTASSIUM CHLORIDE 20 MEQ PO PACK
40.0000 meq | PACK | Freq: Two times a day (BID) | ORAL | Status: AC
  Administered 2020-04-29 (×2): 40 meq
  Filled 2020-04-29 (×2): qty 2

## 2020-04-29 MED ORDER — INSULIN ASPART 100 UNIT/ML ~~LOC~~ SOLN
0.0000 [IU] | SUBCUTANEOUS | Status: DC
  Administered 2020-04-29 (×3): 2 [IU] via SUBCUTANEOUS
  Administered 2020-04-29: 1 [IU] via SUBCUTANEOUS
  Administered 2020-04-30: 3 [IU] via SUBCUTANEOUS
  Administered 2020-04-30 (×3): 2 [IU] via SUBCUTANEOUS
  Administered 2020-04-30: 3 [IU] via SUBCUTANEOUS
  Administered 2020-05-01: 5 [IU] via SUBCUTANEOUS
  Administered 2020-05-01 (×3): 3 [IU] via SUBCUTANEOUS
  Administered 2020-05-01: 5 [IU] via SUBCUTANEOUS
  Administered 2020-05-01: 3 [IU] via SUBCUTANEOUS
  Administered 2020-05-02: 5 [IU] via SUBCUTANEOUS
  Administered 2020-05-02: 7 [IU] via SUBCUTANEOUS
  Administered 2020-05-02 – 2020-05-03 (×7): 5 [IU] via SUBCUTANEOUS
  Administered 2020-05-03: 7 [IU] via SUBCUTANEOUS
  Administered 2020-05-03: 9 [IU] via SUBCUTANEOUS
  Administered 2020-05-03 – 2020-05-04 (×3): 7 [IU] via SUBCUTANEOUS
  Administered 2020-05-04: 9 [IU] via SUBCUTANEOUS
  Administered 2020-05-04 (×3): 7 [IU] via SUBCUTANEOUS

## 2020-04-29 MED ORDER — HYDRALAZINE HCL 50 MG PO TABS: 50.0000 mg | ORAL_TABLET | Freq: Three times a day (TID) | ORAL | Status: DC

## 2020-04-29 NOTE — Progress Notes (Signed)
LTM EEG discontinued - no skin breakdown at unhook.   

## 2020-04-29 NOTE — Progress Notes (Signed)
NAME:  Sarah Colon, MRN:  [id number], DOB:  Oct 06, 1945, LOS: 55 ADMISSION DATE:  02/29/2020, CONSULTATION DATE: 04/27/2020 REFERRING MD: Dr.Khaliqdina, CHIEF COMPLAINT: Recurrent seizures  Brief History   74 year old female with chronic A. fib, CKD and diabetes presented with unresponsiveness found to be in nonconvulsive status epilepticus.  PCCM was consulted for help with management  History of present illness   74 year old female with diabetes type 2, diastolic congestive heart failure, hypertension and proximal A. fib who initially presented with generalized weakness, noted to have hemoglobin of 3.  Also there was a concern of discitis/osteomyelitis at L4-L5, was started on daptomycin and cefepime per ID recommendations, noted to have worsening mental status, neurology was consulted, patient had EEG done which showed nonconvulsive status epilepticus, patient was given Ativan and then loaded with Keppra but she continued to have altered mental status and intermittent seizures.  Cefepime was switched to IV Zosyn.  Critical care was consulted for evaluation and management of airway considering patient is in status epilepticus.  Past Medical History  Diabetes type 2 Hypertension CKD stage IV Proximal A. fib Diastolic congestive heart failure  Significant Hospital Events   Diagnosed with discitis/osteomyelitis of L4-L5  Consults:  Infectious disease Neurology PCCM  Procedures:  IR guided aspiration of LP/L5 disc  Significant Diagnostic Tests:  cEEG 11/29 - Concering for SIRPIDS   Micro Data:  11/5 urine culture MDR procidentia and VRE  Antimicrobials:  cefepime 11/24>>11/27 Daptomycin 11/24 >>  Zosyn 11/28 >>  Interim history/subjective:  Patient is awake. Non-verbal this morning. Blood pressure has been elevated.   Objective   Blood pressure (!) 177/79, pulse 67, temperature 97.9 F (36.6 C), temperature source Axillary, resp. rate (!) 24, height 5\' 1"  (1.549 m),  weight 67.7 kg, SpO2 98 %.        Intake/Output Summary (Last 24 hours) at 04/29/2020 1016 Last data filed at 04/29/2020 2725 Gross per 24 hour  Intake 859.15 ml  Output 450 ml  Net 409.15 ml   Filed Weights   04/27/20 0444 04/28/20 0326 04/29/20 0359  Weight: 68.2 kg 65.1 kg 67.7 kg    Examination:   Physical exam: General: lying on the bed, no acute distress HEENT: [place]/AT, eyes anicteric.    Moist mucous membranes Neuro: Opens eyes with vocal stimuli, not following commands.  PERRL Chest: Coarse breath sounds, no wheezes or rhonchi Heart: Regular rate and rhythm, no murmurs or gallops Abdomen: Soft, nontender, distended, bowel sounds present Skin: No rash  Resolved Hospital Problem list   MDR procidentia and VRE UTI Symptomatic acute blood loss anemia from GI bleed  Assessment & Plan:  Acute metabolic encephalopathy Status epilepticus in the setting of cefepime - Neuro Following, cEEG has been discontinued - Continue keppra and depakote  Discitis/osteomyelitis of L4-L5 - Continue dapto and pip/tazo, tentative end date 06/04/2020  AKI on CKD stage IV -Cr trending down - continue to monitor  Paroxysmal A. Fib -Rate control with metoprolol and diltazem -monitor electrolytes - apixaban for anticoagulation  Diastolic congestive heart failure Hypertension - Will add hydralazine 50mg  TID for elevated BP - Continue 20mg  lasix daily  Diabetes type 2 - SSI q4hr  Best practice (evaluated daily)   Diet: TF Pain/Anxiety/Delirium protocol (if indicated): N/A VAP protocol (if indicated): N/A DVT prophylaxis: Apixaban GI prophylaxis: Protonix Glucose control: SSI Mobility: Bedrest last date of multidisciplinary goals of care discussion 04/27/2020 Family and staff present patient's RN Summary of discussion spoke with patient's daughter over the phone, she requested  to keep patient on DNR status but intubation is okay in case if her seizures are not well controlled  and she is not protecting her airway Follow up goals of care discussion due 05/03/2020 Code Status: DNR Disposition: ICU  Labs   CBC: Recent Labs  Lab 04/23/20 0520 04/26/20 0446 04/27/20 0908 04/28/20 0459  WBC 19.5* 16.3* 15.2* 12.8*  NEUTROABS  --  11.4*  --   --   HGB 8.0* 8.4* 8.2* 8.1*  HCT 25.0* 26.1* 26.4* 25.5*  MCV 77.6* 78.4* 79.8* 78.9*  PLT 387 311 292 211    Basic Metabolic Panel: Recent Labs  Lab 04/23/20 0520 04/23/20 0520 04/26/20 0446 04/27/20 0908 04/28/20 0326 04/28/20 1317 04/28/20 1641 04/29/20 0334  NA 144  --  146* 144  --  145  --  141  K 3.8  --  3.3* 3.1*  --  3.1*  --  3.5  CL 109  --  106 100  --  105  --  106  CO2 25  --  26 27  --  28  --  27  GLUCOSE 113*  --  93 127*  --  86  --  143*  BUN 35*  --  32* 27*  --  23  --  23  CREATININE 2.42*   < > 2.08* 2.26* 2.19* 2.26*  --  2.26*  CALCIUM 8.3*  --  8.3* 8.1*  --  8.2*  --  7.8*  MG  --   --  1.7 1.5*  --   --  1.9 2.0  PHOS  --   --  4.0  --   --   --   --   --    < > = values in this interval not displayed.   GFR: Estimated Creatinine Clearance: 18.7 mL/min (A) (by C-G formula based on SCr of 2.26 mg/dL (H)). Recent Labs  Lab 04/23/20 0520 04/26/20 0446 04/27/20 0908 04/28/20 0459  WBC 19.5* 16.3* 15.2* 12.8*    Liver Function Tests: Recent Labs  Lab 04/23/20 0520 04/26/20 0446  AST 43* 38  ALT 16 13  ALKPHOS 200* 172*  BILITOT 0.7 0.8  PROT 6.3* 6.5  ALBUMIN 2.3* 2.2*   No results for input(s): LIPASE, AMYLASE in the last 168 hours. Recent Labs  Lab 04/26/20 0709  AMMONIA 21    ABG    Component Value Date/Time   HCO3 28.4 (H) 04/26/2020 1020   TCO2 24 04/07/2013 2010   O2SAT 67.9 04/26/2020 1020     Coagulation Profile: No results for input(s): INR, PROTIME in the last 168 hours.  Cardiac Enzymes: Recent Labs  Lab 04/29/20 0334  CKTOTAL 20*    HbA1C: Hgb A1c MFr Bld  Date/Time Value Ref Range Status  03/30/2020 05:00 AM 5.6 4.8 - 5.6 %  Final    Comment:    (NOTE) Pre diabetes:          5.7%-6.4%  Diabetes:              >6.4%  Glycemic control for   <7.0% adults with diabetes   12/09/2019 05:48 AM 7.3 (H) 4.8 - 5.6 % Final    Comment:    (NOTE) Pre diabetes:          5.7%-6.4%  Diabetes:              >6.4%  Glycemic control for   <7.0% adults with diabetes     CBG: Recent Labs  Lab 04/28/20 1509  04/28/20 1924 04/28/20 2327 04/29/20 0322 04/29/20 0744  GLUCAP 76 91 118* 129* 172*   Critical care time 40 minutes  Freda Jackson, MD [facility] Office: [phone]   See Amion for Pager Details

## 2020-04-29 NOTE — Progress Notes (Signed)
LTM maint complete - no skin breakdown under: FP1,F7 event button tested

## 2020-04-29 NOTE — Procedures (Signed)
Patient Name:Sarah Colon RFX:[id number] Epilepsy Attending:Ahmari Garton Barbra Sarks Referring Physician/Provider:Dr Roland Rack Duration:04/29/2020 2641 to 11/30/20210913  Patient history:74 yo F with encephalopathy in the setting of VRE UTI.Stat eeg consistent with status epilepticus. EEG to evaluate for seizure.  Level of alertness:Awake, asleep  AEDs during EEG study:LEV, VPA  Technical aspects: This EEG study was done with scalp electrodes positioned according to the 10-20 International system of electrode placement. Electrical activity was acquired at a sampling rate of 500Hz  and reviewed with a high frequency filter of 70Hz  and a low frequency filter of 1Hz . EEG data were recorded continuously and digitally stored.   Description:No clear posterior dominant rhythm was seen. Sleep was characterized by vertex waves, sleep spindles (12 to 14 Hz), maximal frontocentral region. EEG showed continuous generalized 3 to 6 Hz theta-delta slowing. Periodic discharges were triphasic morphology at 1hz  were noted. When patientwasstimulated, the periodic discharges appear more frequent at 2 Hz and appeared rhythmic concerning for stimulus induced rhythmic periodic ictal-interictal discharges ( SIRPIDS).  ABNORMALITY -PeriodicEpileptiform discharges with triphasic morphology, generalized -Stimulation induced rhythmic periodic ictal hyper interictal discharges (SIRPIDS) -Continuous slow, generalized  IMPRESSION: This study showedevidence of generalized epileptogenicity. There were periodic epileptiform discharges with triphasic morphology which appeared rhythmic after stimulation and at frequency of 2 Hz which is on the ictal pattern interictal continuum with high potential for seizures. However, no significant change in SIRPIDSwas noted after titration of AEDs which suggests interictal nature. Additionally there was evidence of moderate to severe diffuse encephalopathy,  nonspecific etiology.  EEG appears similar to yesterday.  [name] Barbra Sarks

## 2020-04-29 NOTE — Progress Notes (Signed)
[facility] Progress Note Patient Name: Sarah Colon DOB: May 24, 1946 MRN: [id number]   Date of Service  04/29/2020  HPI/Events of Note  Hypertension - BP = 178/79 after getting Hydralazine and Metoprolol per tube. Patient was on Metoprolol 50 mg BID at home.   eICU Interventions  Plan: 1. Metoprolol 25 mg per tube now (extra dose). 2. Increase Metoprolol dose to 50 mg per tube BID.      Intervention Category Major Interventions: Hypertension - evaluation and management  Garrit Marrow Eugene 04/29/2020, 10:53 PM

## 2020-04-29 NOTE — Progress Notes (Signed)
Subjective: Awake, not following commands  ROS: Unable to obtain due to poor mental status  Examination  Vital signs in last 24 hours: Temp:  [97.1 F (36.2 C)-98.1 F (36.7 C)] 97.8 F (36.6 C) (11/30 1112) Pulse Rate:  [61-214] 75 (11/30 1100) Resp:  [15-32] 22 (11/30 1100) BP: (120-239)/(55-199) 200/76 (11/30 1000) SpO2:  [43 %-100 %] 97 % (11/30 1100) Weight:  [67.7 kg] 67.7 kg (11/30 0359)  General: lying in bed, not in apparent distress CVS: pulse-normal rate and rhythm RS: breathing comfortably, CTA B Extremities: normal, warm  Neuro:  Awake, alert, looks at examiner, does not follow commands, PERRLA, no forced gaze deviation, no apparent facial asymmetry, spontaneously moving bilateral upper extremities, withdraws left lower extremity with noxious stimuli, does not withdraw right lower extremity to noxious stimuli but does moan  Basic Metabolic Panel: Recent Labs  Lab 04/23/20 0520 04/23/20 0520 04/26/20 0446 04/26/20 0446 04/27/20 0908 04/28/20 0326 04/28/20 1317 04/28/20 1641 04/29/20 0334  NA 144  --  146*  --  144  --  145  --  141  K 3.8  --  3.3*  --  3.1*  --  3.1*  --  3.5  CL 109  --  106  --  100  --  105  --  106  CO2 25  --  26  --  27  --  28  --  27  GLUCOSE 113*  --  93  --  127*  --  86  --  143*  BUN 35*  --  32*  --  27*  --  23  --  23  CREATININE 2.42*   < > 2.08*  --  2.26* 2.19* 2.26*  --  2.26*  CALCIUM 8.3*   < > 8.3*   < > 8.1*  --  8.2*  --  7.8*  MG  --   --  1.7  --  1.5*  --   --  1.9 2.0  PHOS  --   --  4.0  --   --   --   --   --   --    < > = values in this interval not displayed.    CBC: Recent Labs  Lab 04/23/20 0520 04/26/20 0446 04/27/20 0908 04/28/20 0459  WBC 19.5* 16.3* 15.2* 12.8*  NEUTROABS  --  11.4*  --   --   HGB 8.0* 8.4* 8.2* 8.1*  HCT 25.0* 26.1* 26.4* 25.5*  MCV 77.6* 78.4* 79.8* 78.9*  PLT 387 311 292 278     Coagulation Studies: No results for input(s): LABPROT, INR in the last 72  hours.  Imaging No new brain imaging overnight  ASSESSMENT AND PLAN: 74 year old female with history of A. 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  Nonconvulsive status epilepticus (improved) -Routine EEG was concerning for nonconvulsive status epilepticus which has since improved after adding Keppra and Depakote. -LTM EEG now showing SIRPIDS which has not significantly changed after adjusting AEDs and therefore is most likely interictal.  Recommendations - SIRPIDS most likely secondary to cephalosporin toxicity and infection in the setting of renal dysfunction -Continue current dose of Keppra and Depakote -If patient has any further clinical seizures, will add Vimpat -Discontinue LTM EEG as EEG appears to be similar -We will check ammonia level to make sure is not contributing to  encephalopathy -Seizure precautions -as needed IV Ativan 2 mg for clinical seizures lasting more than 2 minutes -Management of rest of comorbidities per primary team  I have spent a total of  35 minutes with the patient reviewing hospital notes,  test results, labs and examining the patient as well as establishing an assessment and plan.  > 50% of time was spent in direct patient care.     Zeb Comfort Epilepsy [facility] For questions after 5pm please refer to AMION to reach the Neurologist on call

## 2020-04-30 LAB — BASIC METABOLIC PANEL
Anion gap: 13 (ref 5–15)
BUN: 25 mg/dL — ABNORMAL HIGH (ref 8–23)
CO2: 26 mmol/L (ref 22–32)
Calcium: 8.5 mg/dL — ABNORMAL LOW (ref 8.9–10.3)
Chloride: 108 mmol/L (ref 98–111)
Creatinine, Ser: 2.36 mg/dL — ABNORMAL HIGH (ref 0.44–1.00)
GFR, Estimated: 21 mL/min — ABNORMAL LOW (ref >60)
Glucose, Bld: 241 mg/dL — ABNORMAL HIGH (ref 70–99)
Potassium: 4.4 mmol/L (ref 3.5–5.1)
Sodium: 147 mmol/L — ABNORMAL HIGH (ref 135–145)

## 2020-04-30 LAB — GLUCOSE, CAPILLARY
Glucose-Capillary: 167 mg/dL — ABNORMAL HIGH (ref 70–99)
Glucose-Capillary: 181 mg/dL — ABNORMAL HIGH (ref 70–99)
Glucose-Capillary: 191 mg/dL — ABNORMAL HIGH (ref 70–99)
Glucose-Capillary: 204 mg/dL — ABNORMAL HIGH (ref 70–99)
Glucose-Capillary: 213 mg/dL — ABNORMAL HIGH (ref 70–99)
Glucose-Capillary: 226 mg/dL — ABNORMAL HIGH (ref 70–99)

## 2020-04-30 LAB — MAGNESIUM: Magnesium: 2.1 mg/dL (ref 1.7–2.4)

## 2020-04-30 MED ORDER — GERHARDT'S BUTT CREAM
TOPICAL_CREAM | Freq: Two times a day (BID) | CUTANEOUS | Status: DC
  Administered 2020-05-01 – 2020-05-04 (×3): 1 via TOPICAL
  Filled 2020-04-30 (×2): qty 1

## 2020-04-30 MED ORDER — FREE WATER
200.0000 mL | Freq: Three times a day (TID) | Status: DC
  Administered 2020-04-30 – 2020-05-04 (×13): 200 mL

## 2020-04-30 NOTE — Progress Notes (Signed)
PROGRESS NOTE    Sarah Colon  MHD:[id number] DOB: 07-03-1945 DOA: 03/23/2020 PCP: Patient, No Pcp Per    Brief Narrative:  Sarah Colon is a 74 year old female with past medical history significant for type 2 diabetes mellitus, chronic diastolic congestive heart failure, essential hypertension, paroxysmal atrial fibrillation, CKD stage IV who initially [facility] with shortness of breath, chest pain, and fatigue.  Patient with poor appetite and was found to have a hemoglobin of 3 on admission. FOBT was positive in the ED and GI was consulted with possible admission to the hospitalist service.  She underwent EGD and colonoscopy without evidence of bleeding; but multiple polypectomies were performed.  During the hospital course, patient was treated for right lung base pneumonia and prevent C/Enterococcus faecalis UTI in which she completed a course of antibiotics.  Additionally, patient had a complaint of abdominal pain with CT abdomen/pelvis on 04/04/2020 with presacral and rectal findings associated with cortical erosion, sclerosis of unclear etiology.  MRI L-spine consistent with discitis/osteomyelitis.  Patient underwent IR guided aspiration of L4-5 disc with cultures showing no growth.  Infectious disease was consulted and recommended 6-week course of daptomycin and cefepime.  Cefepime was changed to Zosyn due to concerns of AKI as well as possible contributing factor releasing to status epilepticus. Patient was transferred to [facility] for further and neurology evaluation and continuous EEG on 04/27/2020.  11/28:Patient continues to be nonresponsive to verbal command and somnolent.  Received IV loading Keppra and valproate early this morning.  Continues on continuous EEG.  Neurology present at bedside; EEG continues to show seizure activity.  Neurology giving 2 mg IV Ativan and 2 g IV Keppra.  Given her persistent status with likely inability to  protect airway and need for escalation of care, neurology recommends transfer to ICU for higher level of care.  PCCM consulted for evaluation.  12/1- PCCM pickup. Pt remains mostly nonverbal, but noxious stimuli she may moan. Neurology by bedside. Pt with intentional tremor of left leg.   Consultants:   Neurology, PCCM, ID  Procedures:  IR guided aspiration of LP/L5 disc cEEG 11/29 - Concering for SIRPIDS  Antimicrobials:  cefepime 11/24>>11/27 Daptomycin 11/24 >>  Zosyn 11/28 >>    Subjective: Mostly nonverbal as stated above.  No overnight issues  Objective: Vitals:   04/30/20 0504 04/30/20 0748 04/30/20 1121 04/30/20 1122  BP: (!) 181/85 (!) 186/69 (!) 169/75 (!) 169/75  Pulse:    78  Resp:  20  16  Temp:  98 F (36.7 C)  97.6 F (36.4 C)  TempSrc:  Axillary  Axillary  SpO2:  97%  94%  Weight:      Height:        Intake/Output Summary (Last 24 hours) at 04/30/2020 1426 Last data filed at 04/30/2020 0900 Gross per 24 hour  Intake 1576.63 ml  Output 110 ml  Net 1466.63 ml   Filed Weights   04/28/20 0326 04/29/20 0359 04/30/20 0327  Weight: 65.1 kg 67.7 kg 67.4 kg    Examination:  General exam: Appears calm and comfortable, nonverbal eyes closed not to interactive with physical exam Respiratory system: Clear to auscultation. Respiratory effort poor Cardiovascular system: S1 & S2 heard, RRR. No JVD, murmurs, rubs, gallops or clicks. Gastrointestinal system: Abdomen is nondistended, soft and nontender.  Normal bowel sounds heard. Central nervous system: Eyes closed unable to assess Extremities: No edema Skin: Warm dry Psychiatry: Unable to assess    Data Reviewed:  I have personally reviewed following labs and imaging studies  CBC: Recent Labs  Lab 04/26/20 0446 04/27/20 0908 04/28/20 0459  WBC 16.3* 15.2* 12.8*  NEUTROABS 11.4*  --   --   HGB 8.4* 8.2* 8.1*  HCT 26.1* 26.4* 25.5*  MCV 78.4* 79.8* 78.9*  PLT 311 292 621   Basic Metabolic  Panel: Recent Labs  Lab 04/26/20 0446 04/26/20 0446 04/27/20 0908 04/28/20 0326 04/28/20 1317 04/28/20 1641 04/29/20 0334 04/30/20 0353  NA 146*  --  144  --  145  --  141 147*  K 3.3*  --  3.1*  --  3.1*  --  3.5 4.4  CL 106  --  100  --  105  --  106 108  CO2 26  --  27  --  28  --  27 26  GLUCOSE 93  --  127*  --  86  --  143* 241*  BUN 32*  --  27*  --  23  --  23 25*  CREATININE 2.08*   < > 2.26* 2.19* 2.26*  --  2.26* 2.36*  CALCIUM 8.3*  --  8.1*  --  8.2*  --  7.8* 8.5*  MG 1.7  --  1.5*  --   --  1.9 2.0 2.1  PHOS 4.0  --   --   --   --   --   --   --    < > = values in this interval not displayed.   GFR: Estimated Creatinine Clearance: 17.8 mL/min (A) (by C-G formula based on SCr of 2.36 mg/dL (H)). Liver Function Tests: Recent Labs  Lab 04/26/20 0446  AST 38  ALT 13  ALKPHOS 172*  BILITOT 0.8  PROT 6.5  ALBUMIN 2.2*   No results for input(s): LIPASE, AMYLASE in the last 168 hours. Recent Labs  Lab 04/26/20 0709 04/29/20 1608  AMMONIA 21 37*   Coagulation Profile: No results for input(s): INR, PROTIME in the last 168 hours. Cardiac Enzymes: Recent Labs  Lab 04/29/20 0334  CKTOTAL 20*   BNP (last 3 results) No results for input(s): PROBNP in the last 8760 hours. HbA1C: No results for input(s): HGBA1C in the last 72 hours. CBG: Recent Labs  Lab 04/29/20 1924 04/29/20 2259 04/30/20 0324 04/30/20 0815 04/30/20 1136  GLUCAP 160* 189* 226* 167* 181*   Lipid Profile: No results for input(s): CHOL, HDL, LDLCALC, TRIG, CHOLHDL, LDLDIRECT in the last 72 hours. Thyroid Function Tests: No results for input(s): TSH, T4TOTAL, FREET4, T3FREE, THYROIDAB in the last 72 hours. Anemia Panel: No results for input(s): VITAMINB12, FOLATE, FERRITIN, TIBC, IRON, RETICCTPCT in the last 72 hours. Sepsis Labs: No results for input(s): PROCALCITON, LATICACIDVEN in the last 168 hours.  Recent Results (from the past 240 hour(s))  Aerobic/Anaerobic Culture  (surgical/deep wound)     Status: None   Collection Time: 04/22/20  4:10 PM   Specimen: Abscess  Result Value Ref Range Status   Specimen Description   Final    ABSCESS L4/5 DISC ASPIRATION Performed at [facility] [address], [place], [place] [address]    Special Requests   Final    Normal Performed at [facility], [place] [address] [place], [place] [address]    Gram Stain NO WBC SEEN NO ORGANISMS SEEN   Final   Culture   Final    No growth aerobically or anaerobically. Performed at [facility], [place],  [place] [address]    Report Status 04/27/2020 FINAL  Final  Culture, Urine     Status: None   Collection Time: 04/26/20  1:51 PM   Specimen: Urine, Clean Catch  Result Value Ref Range Status   Specimen Description   Final    URINE, CLEAN CATCH Performed at [facility], [place] [address], [place], [place] [address]    Special Requests   Final    NONE Performed at [facility], [place] [address], [place], [place] [address]    Culture   Final    NO GROWTH Performed at [facility], [place] [address] [place], [place] [address]    Report Status 04/27/2020 FINAL  Final         Radiology Studies: No results found.      Scheduled Meds: . apixaban  2.5 mg Per Tube BID  . Chlorhexidine Gluconate Cloth  6 each Topical Daily  . diclofenac Sodium  2 g Topical QID  . diltiazem  30 mg Per Tube Q6H  . feeding supplement (PROSource TF)  45 mL Per Tube BID  . ferrous sulfate  325 mg Per Tube Q breakfast  . furosemide  20 mg Per Tube BID  . hydrALAZINE  50 mg Per Tube Q8H  . insulin aspart  0-9 Units Subcutaneous Q4H  . lactulose  10 g Per Tube BID  . levothyroxine  88 mcg Per Tube Q0600  . metoprolol tartrate  50 mg Per Tube BID  . pantoprazole sodium  40 mg Per Tube QHS  . sodium bicarbonate  1,300 mg Per Tube TID  . sodium chloride flush  10-40 mL Intracatheter Q12H   . sodium chloride flush  3 mL Intravenous Q12H   Continuous Infusions: . sodium chloride 250 mL (04/29/20 2058)  . DAPTOmycin (CUBICIN)  IV Stopped (04/29/20 2000)  . feeding supplement (OSMOLITE 1.5 CAL) 1,000 mL (04/29/20 1811)  . levETIRAcetam 500 mg (04/30/20 0958)  . piperacillin-tazobactam (ZOSYN)  IV 3.375 g (04/30/20 1123)  . 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  CT head with no acute intracranial findings.  MR brain with no acute findings.  Urinalysis unrevealing.  Ammonia level 21.  Continuous EEG showed frontally predominant sharp wave discharges. Neurology was concerned about need for further escalation of care and requesting ICU transfer, PCCM consulted. Neurology following-Routine EEG was concerning for nonconvulsive status epilepticus which has since improved after adding Keppra and Depakote. -LTM EEG now showingSIRPIDSwhich has not significantly changed after adjusting AEDs and therefore is most likely interictal. 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  Underwent IR lumbar disc aspiration on 04/22/2020 with 2 mL turbid serosanguineous fluid obtained. 12/1-ID following Continue dapto and zosyn for now Tentative plan for 6 weeks total , end date 06/04/20  Providencia stuartii and VRE UTI Completed course of abx Repeat ucx--Completed course of antibiotics --Repeat urine culture NTD  AKI on CKD stage IV Baseline creatinine 1.5-1.8. --Cr 2.30>>1.41>>4.82>>2.08>2.26>>>2/36 Was on ivf, I dont see this. Will resume Avoid nephrotoxins, renally dose all medications Monitor renal function  Symptomatic anemia Hemoglobin 3.5 on admission. FOBT positive. --Hgb 3.5>3.1>10.0>>>8.1 Appears to be stabilizing Ferrous sulfate daily  PPI daily  Monitor  as patient is on Eliquis   Cirrhosis, new diagnosis Incidental finding of cirrhosis on CT abdomen/pelvis.  MR abdomen degraded exam however no concerning lesions.  Acute hepatitis panel with equivocal results for hepatitis A antibody.   -We will need GI follow-up as outpatient with repeat hepatitis panel    Type 2 diabetes mellitus Hemoglobin A1c 5.6 on 03/30/2020, well controlled. Home regimen includes Levemir 20 units subcutaneously daily, Januvia 25 mg p.o. daily N.p.o. hearing on tube feeding Continue R-ISS  Chronic diastolic congestive heart failure, compensated TTE 03/30/2020, LVEF 65-70%, no LV regional wall motion abnormalities, mild concentric LVH, grade 1 diastolic dysfunction, mild MR, moderate TR, moderate aortic valve sclerosis without evidence of aortic stenosis, IVC dilated.  12/1-more on dry side.  Bun /creatinine up a bit. Hold lasix, start free water for hydration Continue beta blk I/o Daily weight  Essential hypertension Paroxysmal atrial fibrillation --Diltiazem 120  mg p.o. daily --Metoprolol tartrate 25 mg BID --Apixaban 2.5 mg twice daily (was on Xarelto outpatient but changed due to renal function)  Hypothyroidism:  --Levothyroxine 88 mcg p.o. daily, may need to convert to IV form if AMS persists  Hx Gout: on allopurinol and colchisine      DVT prophylaxis: Eliquis Code Status: DNR Family Communication: None at bedside  Status is: Inpatient  Remains inpatient appropriate because:IV treatments appropriate due to intensity of illness or inability to take PO   Dispo:  Patient From: Home  Planned Disposition: Home  Expected discharge date: 05/02/20  Medically stable for discharge: No             LOS: 32 days   Time spent: 35 minutes with more than 50% on [place], MD [facility] Pager [phone]  If 7PM-7AM, please contact night-coverage [email or web address] Password [email or web address] 04/30/2020, 2:26 PM

## 2020-04-30 NOTE — Progress Notes (Addendum)
After chart review, noted to have expired POC and informed supervising PT of need for assessment at this time.  Will defer PT needs to supervising PT.  Jarvis Morgan [facility] Pager [phone] Office [phone]
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
## 2020-04-30 NOTE — Progress Notes (Addendum)
Subjective: No clinical events overnight.  ROS: Unable to obtain due to poor mental status  Examination  Vital signs in last 24 hours: Temp:  [97.1 F (36.2 C)-98.5 F (36.9 C)] 98 F (36.7 C) (12/01 0748) Pulse Rate:  [70-85] 77 (12/01 0327) Resp:  [14-27] 20 (12/01 0748) BP: (123-200)/(56-108) 186/69 (12/01 0748) SpO2:  [96 %-99 %] 97 % (12/01 0748) Weight:  [67.4 kg] 67.4 kg (12/01 0327)  General: lying in bed,not in apparent distress CVS: pulse-normal rate and rhythm RS: breathing comfortably,CTAB Extremities: normal,warm  Neuro:Awake, alert, looks at examiner, does not follow commands, keeps saying "no", PERRLA, no forced gaze deviation, no apparent facial asymmetry, spontaneously moving bilateral upper extremities, withdraws bilateral lower extremities, tremulous movements of BL upper and lower extremities upon stimulation, mild rigidity in bilateral upper extremity  Basic Metabolic Panel: Recent Labs  Lab 04/26/20 0446 04/26/20 0446 04/27/20 0908 04/27/20 0908 04/28/20 0326 04/28/20 1317 04/28/20 1641 04/29/20 0334 04/30/20 0353  NA 146*  --  144  --   --  145  --  141 147*  K 3.3*  --  3.1*  --   --  3.1*  --  3.5 4.4  CL 106  --  100  --   --  105  --  106 108  CO2 26  --  27  --   --  28  --  27 26  GLUCOSE 93  --  127*  --   --  86  --  143* 241*  BUN 32*  --  27*  --   --  23  --  23 25*  CREATININE 2.08*   < > 2.26*  --  2.19* 2.26*  --  2.26* 2.36*  CALCIUM 8.3*   < > 8.1*   < >  --  8.2*  --  7.8* 8.5*  MG 1.7  --  1.5*  --   --   --  1.9 2.0 2.1  PHOS 4.0  --   --   --   --   --   --   --   --    < > = values in this interval not displayed.    CBC: Recent Labs  Lab 04/26/20 0446 04/27/20 0908 04/28/20 0459  WBC 16.3* 15.2* 12.8*  NEUTROABS 11.4*  --   --   HGB 8.4* 8.2* 8.1*  HCT 26.1* 26.4* 25.5*  MCV 78.4* 79.8* 78.9*  PLT 311 292 278     Coagulation Studies: No results for input(s): LABPROT, INR in the last 72  hours.  Imaging No new brain imaging overnight  ASSESSMENT AND PLAN: 74 year old female with history of A. 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 -LTM EEG showedSIRPIDSwhich has not significantly changed after adjusting AEDs and therefore is most likely interictal. 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  Patient has mild hyperammonemia but it is unlikely that it is contributing significantly to her current mental status.  Recommendations -Continue current dose of Keppra and Depakote -If patient has worsening of mental status, forced gaze deviation, stiffening or generalized tonic-clonic seizure-like  activity, please notify neurology. -Seizure precautions -as needed IV Ativan 2 mg for clinical seizures lasting more than 2 minutes -Follow-up with neurology in 8 to 10 weeks after discharge -Management of rest of comorbidities per primary team  Neurology will follow peripherally. Please call us for nay questions.   I have spent a total of 25 minuteswith the patient reviewing hospitalnotes,  test results, labs and examining the patient as well as establishing an assessment and plan.>50% of time was spent in direct patient care.  Zeb Comfort Epilepsy [facility] For questions after 5pm please refer to AMION to reach the Neurologist on call

## 2020-04-30 DEATH — deceased

## 2020-05-01 LAB — GLUCOSE, CAPILLARY
Glucose-Capillary: 222 mg/dL — ABNORMAL HIGH (ref 70–99)
Glucose-Capillary: 231 mg/dL — ABNORMAL HIGH (ref 70–99)
Glucose-Capillary: 252 mg/dL — ABNORMAL HIGH (ref 70–99)
Glucose-Capillary: 248 mg/dL — ABNORMAL HIGH (ref 70–99)
Glucose-Capillary: 297 mg/dL — ABNORMAL HIGH (ref 70–99)
Glucose-Capillary: 262 mg/dL — ABNORMAL HIGH (ref 70–99)

## 2020-05-01 LAB — BASIC METABOLIC PANEL
Anion gap: 17 — ABNORMAL HIGH (ref 5–15)
BUN: 27 mg/dL — ABNORMAL HIGH (ref 8–23)
CO2: 24 mmol/L (ref 22–32)
Calcium: 8.7 mg/dL — ABNORMAL LOW (ref 8.9–10.3)
Chloride: 106 mmol/L (ref 98–111)
Creatinine, Ser: 2.54 mg/dL — ABNORMAL HIGH (ref 0.44–1.00)
GFR, Estimated: 19 mL/min — ABNORMAL LOW (ref >60)
Glucose, Bld: 243 mg/dL — ABNORMAL HIGH (ref 70–99)
Potassium: 3.9 mmol/L (ref 3.5–5.1)
Sodium: 147 mmol/L — ABNORMAL HIGH (ref 135–145)

## 2020-05-01 LAB — MRSA PCR SCREENING: MRSA by PCR: NEGATIVE

## 2020-05-01 LAB — MAGNESIUM: Magnesium: 2.1 mg/dL (ref 1.7–2.4)

## 2020-05-01 MED ORDER — SODIUM CHLORIDE 0.45 % IV SOLN: INTRAVENOUS | Status: DC

## 2020-05-01 MED ORDER — INSULIN DETEMIR 100 UNIT/ML ~~LOC~~ SOLN
5.0000 [IU] | Freq: Every day | SUBCUTANEOUS | Status: DC
  Administered 2020-05-01: 5 [IU] via SUBCUTANEOUS
  Filled 2020-05-01 (×4): qty 0.05

## 2020-05-01 NOTE — Progress Notes (Signed)
PHARMACY CONSULT NOTE FOR:  OUTPATIENT  PARENTERAL ANTIBIOTIC THERAPY (OPAT)  Indication: Discitis Regimen:  Daptomycin 500mg  IV q48 hours Zosyn 2.25g IV q8 hours End date: 06/04/20  IV antibiotic discharge orders are pended. To discharging provider:  please sign these orders via discharge navigator,  Select New Orders & click on the button choice - Manage This Unsigned Work.     Thank you for allowing pharmacy to be a part of this patient's care.  Erin Hearing PharmD., BCPS Clinical Pharmacist 05/01/2020 8:06 PM

## 2020-05-01 NOTE — Progress Notes (Addendum)
Inpatient Diabetes Program Recommendations  AACE/ADA: New Consensus Statement on Inpatient Glycemic Control (2015)  Target Ranges:  Prepandial:   less than 140 mg/dL      Peak postprandial:   less than 180 mg/dL (1-2 hours)      Critically ill patients:  140 - 180 mg/dL   Lab Results  Component Value Date   GLUCAP 231 (H) 05/01/2020   HGBA1C 5.6 03/30/2020    Review of Glycemic Control Results for [name], [name] (MRN [id number]) as of 05/01/2020 10:42  Ref. Range 04/30/2020 16:16 04/30/2020 19:56 04/30/2020 23:49 05/01/2020 03:19 05/01/2020 08:01  Glucose-Capillary Latest Ref Range: 70 - 99 mg/dL 191 (H) 204 (H) 213 (H) 222 (H) 231 (H)   Diabetes history: DM 2 Outpatient Diabetes medications:  Levemir 20 units q HS, Januvia 25 mg daily Current orders for Inpatient glycemic control:  Novolog sensitive q 4 hours Osmolite 50 ml/hr Inpatient Diabetes Program Recommendations:   May consider adding Levemir 10 units daily.   Thanks  Adah Perl, RN, BC-ADM Inpatient Diabetes Coordinator Pager [phone] (8a-5p)

## 2020-05-01 NOTE — Progress Notes (Signed)
Pharmacy Antibiotic Note  Sarah Colon is a 74 y.o. female admitted on 03/02/2020 with L4-L5 discitis/osteomyelitis and is s/p disc aspiration on 11/23. The patient has CKD with current Scr at 2.54 and CrCl ~17 mL/min. Pharmacy has been consulted for daptomycin and Zosyn dosing.  Plan: Continue daptomycin IV 500 mg q48h Monitor weekly CK on Mondays, starting 11/30 Continue Zosyn IV 3.375 g q12h (4-hour extended infusion) Monitor renal function and adjust dosing as needed Plan for 6 weeks of therapy per infectious disease, following recomendations  Height: 5\' 1"  (154.9 cm) Weight: 68.6 kg (151 lb 3.8 oz) IBW/kg (Calculated) : 47.8  Temp (24hrs), Avg:98.4 F (36.9 C), Min:97.7 F (36.5 C), Max:98.9 F (37.2 C)  Recent Labs  Lab 04/26/20 0446 04/26/20 0446 04/27/20 0908 04/27/20 0908 04/28/20 0326 04/28/20 0459 04/28/20 1317 04/29/20 0334 04/30/20 0353 05/01/20 0320  WBC 16.3*  --  15.2*  --   --  12.8*  --   --   --   --   CREATININE 2.08*   < > 2.26*   < > 2.19*  --  2.26* 2.26* 2.36* 2.54*   < > = values in this interval not displayed.    Estimated Creatinine Clearance: 16.7 mL/min (A) (by C-G formula based on SCr of 2.54 mg/dL (H)).    Allergies  Allergen Reactions  . Codeine Other (See Comments)    Swelling    Antimicrobials this admission: 10/31 Rifaximin >> 11/21 11/5 Unasyn >> 11/6 11/6 Meropenem >> 11/7 11/7 Zosyn >> 11/12; resumed 11/27>> 11/24 Daptomycin >> 11/24 Cefepime >> 11/27  Dose adjustments this admission: 11/29: Zosyn IV 3.375 g q8h >> Zosyn IV 3.375 g q12h (pts CrCl dropped <20 mL/min)  Microbiology results:  11/27 UCx NGTD 11/23 L4/L5 abscess aspiration: NGTD 11/19 Respiratory panel: NGTD 11/5 BCx: NGTD 11/5 UCx: 70K Providencia stuartii, >100K VRE, susceptibile to cefepime/Zosyn 11/1 MRSA PCR: negative 10/30 Respiratory panel: NGTD   Thank you for allowing pharmacy to be a part of this patient's care.   Arrie Senate,  PharmD, BCPS, [facility] Clinical Pharmacist [phone] Please check AMION for all [place] numbers 05/01/2020

## 2020-05-01 NOTE — Consult Note (Signed)
[place] Nurse Consult Note: Reason for Consult: Consult requested for heels.  [place] team was previously consulted on 11/14 for sacrum wound. Wound type: Left heel with .3X.3cm intact darker colored skin; appearance is not dark red or purple and is not consistent with a deep tissue injury.  Right heel with patchy areas of intact darker colored skin; appearance is consistent with healed previous pressure injuries. Dressing procedure/placement/frequency: Foam dressing to bilat heels, change Q 3 days or PRN soiling. Float heels to reduce pressure. Please re-consult if further assistance is needed.  Thank-you,  Julien Girt MSN, [place], [place], [place], [place]

## 2020-05-01 NOTE — Plan of Care (Signed)

## 2020-05-01 NOTE — Progress Notes (Signed)
    [facility] for Infectious Disease   Date of Admission:  03/03/2020     74 yo woman with multiple medical problems previously seen at [facility] by Dr Tommy Medal for L4-5 discitis in setting of decubitus ulcer.  S/p aspiration of disc space with negative cultures.  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  Her antibiotics were changed from cefepime to pip-tazo.  Daptomycin was continued.  Plan for 6 weeks of IV antibiotics as previously outlined.  Will place OPAT consult and arrange follow up with Dr Tommy Medal on 06/02/20 at 11:30 am.  Will sign off for now but please call with any changes in clinical status or if further ID assistance needed.    Raynelle Highland for Infectious Disease [facility] [phone] pager 05/01/2020, 4:52 PM

## 2020-05-01 NOTE — Progress Notes (Addendum)
PROGRESS NOTE    Sarah Colon  BJY:[id number] DOB: 08-20-1945 DOA: 03/18/2020 PCP: Patient, No Pcp Per    Brief Narrative:  Sarah Colon is a 74 year old female with past medical history significant for type 2 diabetes mellitus, chronic diastolic congestive heart failure, essential hypertension, paroxysmal atrial fibrillation, CKD stage IV who initially presented to [facility] from SNF with shortness of breath, chest pain, and fatigue.  Patient with poor appetite and was found to have a hemoglobin of 3 on admission. FOBT was positive in the ED and GI was consulted with possible admission to the hospitalist service.  She underwent EGD and colonoscopy without evidence of bleeding; but multiple polypectomies were performed.  During the hospital course, patient was treated for right lung base pneumonia and prevent C/Enterococcus faecalis UTI in which she completed a course of antibiotics.  Additionally, patient had a complaint of abdominal pain with CT abdomen/pelvis on 04/04/2020 with presacral and rectal findings associated with cortical erosion, sclerosis of unclear etiology.  MRI L-spine consistent with discitis/osteomyelitis.  Patient underwent IR guided aspiration of L4-5 disc with cultures showing no growth.  Infectious disease was consulted and recommended 6-week course of daptomycin and cefepime.  Cefepime was changed to Zosyn due to concerns of AKI as well as possible contributing factor releasing to status epilepticus. Patient was transferred to [facility] for further and neurology evaluation and continuous EEG on 04/27/2020.  11/28:Patient continues to be nonresponsive to verbal command and somnolent.  Received IV loading Keppra and valproate early this morning.  Continues on continuous EEG.  Neurology present at bedside; EEG continues to show seizure activity.  Neurology giving 2 mg IV Ativan and 2 g IV Keppra.  Given her persistent status with likely inability to  protect airway and need for escalation of care, neurology recommends transfer to ICU for higher level of care.  PCCM consulted for evaluation.  12/1- PCCM pickup. Pt remains mostly nonverbal, but noxious stimuli she may moan. Neurology by bedside. Pt with intentional tremor of left leg. 12/2-pt opens eyes, and tries to smile at me today. Started on free water yesterday.    Consultants:   Neurology, PCCM, ID  Procedures:  IR guided aspiration of LP/L5 disc cEEG 11/29 - Concering for SIRPIDS  Antimicrobials:  cefepime 11/24>>11/27 Daptomycin 11/24 >>  Zosyn 11/28 >>    Subjective: No overnight issues.  Patient smiling but does not respond.  Has intentional tremor of upper extremity  Objective: Vitals:   05/01/20 0413 05/01/20 0500 05/01/20 0508 05/01/20 0718  BP: (!) 154/61  (!) 152/72   Pulse: 85   83  Resp: 20   (!) 25  Temp: 98.9 F (37.2 C)   98.3 F (36.8 C)  TempSrc: Axillary   Axillary  SpO2: 98%     Weight:  68.6 kg    Height:        Intake/Output Summary (Last 24 hours) at 05/01/2020 0823 Last data filed at 05/01/2020 0600 Gross per 24 hour  Intake 1670.08 ml  Output --  Net 1670.08 ml   Filed Weights   04/29/20 0359 04/30/20 0327 05/01/20 0500  Weight: 67.7 kg 67.4 kg 68.6 kg    Examination: Try to be more interactive with her eyes today.  Calm comfortable oriented CTA no wheeze rales rhonchi's Regular, S1-S2, no gallops Soft benign positive bowel sounds Trace pedal edema bilaterally Mood and affect minimally improved compared to yesterday    Data Reviewed: I have personally reviewed following labs and  imaging studies  CBC: Recent Labs  Lab 04/26/20 0446 04/27/20 0908 04/28/20 0459  WBC 16.3* 15.2* 12.8*  NEUTROABS 11.4*  --   --   HGB 8.4* 8.2* 8.1*  HCT 26.1* 26.4* 25.5*  MCV 78.4* 79.8* 78.9*  PLT 311 292 094   Basic Metabolic Panel: Recent Labs  Lab 04/26/20 0446 04/26/20 0446 04/27/20 0908 04/27/20 0908 04/28/20 0326  04/28/20 1317 04/28/20 1641 04/29/20 0334 04/30/20 0353 05/01/20 0320  NA 146*   < > 144  --   --  145  --  141 147* 147*  K 3.3*   < > 3.1*  --   --  3.1*  --  3.5 4.4 3.9  CL 106   < > 100  --   --  105  --  106 108 106  CO2 26   < > 27  --   --  28  --  27 26 24   GLUCOSE 93   < > 127*  --   --  86  --  143* 241* 243*  BUN 32*   < > 27*  --   --  23  --  23 25* 27*  CREATININE 2.08*   < > 2.26*   < > 2.19* 2.26*  --  2.26* 2.36* 2.54*  CALCIUM 8.3*   < > 8.1*  --   --  8.2*  --  7.8* 8.5* 8.7*  MG 1.7   < > 1.5*  --   --   --  1.9 2.0 2.1 2.1  PHOS 4.0  --   --   --   --   --   --   --   --   --    < > = values in this interval not displayed.   GFR: Estimated Creatinine Clearance: 16.7 mL/min (A) (by C-G formula based on SCr of 2.54 mg/dL (H)). Liver Function Tests: Recent Labs  Lab 04/26/20 0446  AST 38  ALT 13  ALKPHOS 172*  BILITOT 0.8  PROT 6.5  ALBUMIN 2.2*   No results for input(s): LIPASE, AMYLASE in the last 168 hours. Recent Labs  Lab 04/26/20 0709 04/29/20 1608  AMMONIA 21 37*   Coagulation Profile: No results for input(s): INR, PROTIME in the last 168 hours. Cardiac Enzymes: Recent Labs  Lab 04/29/20 0334  CKTOTAL 20*   BNP (last 3 results) No results for input(s): PROBNP in the last 8760 hours. HbA1C: No results for input(s): HGBA1C in the last 72 hours. CBG: Recent Labs  Lab 04/30/20 1616 04/30/20 1956 04/30/20 2349 05/01/20 0319 05/01/20 0801  GLUCAP 191* 204* 213* 222* 231*   Lipid Profile: No results for input(s): CHOL, HDL, LDLCALC, TRIG, CHOLHDL, LDLDIRECT in the last 72 hours. Thyroid Function Tests: No results for input(s): TSH, T4TOTAL, FREET4, T3FREE, THYROIDAB in the last 72 hours. Anemia Panel: No results for input(s): VITAMINB12, FOLATE, FERRITIN, TIBC, IRON, RETICCTPCT in the last 72 hours. Sepsis Labs: No results for input(s): PROCALCITON, LATICACIDVEN in the last 168 hours.  Recent Results (from the past 240 hour(s))    Aerobic/Anaerobic Culture (surgical/deep wound)     Status: None   Collection Time: 04/22/20  4:10 PM   Specimen: Abscess  Result Value Ref Range Status   Specimen Description   Final    ABSCESS L4/5 DISC ASPIRATION Performed at [facility] [address] [place], [place] [address]    Special Requests   Final    Normal Performed at [facility]  [place] [address] [place], [place] [address]    Gram Stain NO WBC SEEN NO ORGANISMS SEEN   Final   Culture   Final    No growth aerobically or anaerobically. Performed at [facility], [place] [address], [place], [place] [address]    Report Status 04/27/2020 FINAL  Final  Culture, Urine     Status: None   Collection Time: 04/26/20  1:51 PM   Specimen: Urine, Clean Catch  Result Value Ref Range Status   Specimen Description   Final    URINE, CLEAN CATCH Performed at [facility], [place] [address] [place], [place] [address]    Special Requests   Final    NONE Performed at [facility], [place] [address], [place], [place] [address]    Culture   Final    NO GROWTH Performed at [facility], [place] [address], [place], [place] [address]    Report Status 04/27/2020 FINAL  Final         Radiology Studies: No results found.      Scheduled Meds: . apixaban  2.5 mg Per Tube BID  . Chlorhexidine Gluconate Cloth  6 each Topical Daily  . diclofenac Sodium  2 g Topical QID  . diltiazem  30 mg Per Tube Q6H  . feeding supplement (PROSource TF)  45 mL Per Tube BID  . ferrous sulfate  325 mg Per Tube Q breakfast  . free water  200 mL Per Tube Q8H  . Gerhardt's butt cream   Topical BID  . hydrALAZINE  50 mg Per Tube Q8H  . insulin aspart  0-9 Units Subcutaneous Q4H  . lactulose  10 g Per Tube BID  . levothyroxine  88 mcg Per Tube Q0600  . metoprolol tartrate  50 mg Per Tube BID  . pantoprazole sodium  40 mg Per Tube QHS  . sodium bicarbonate  1,300  mg Per Tube TID  . sodium chloride flush  10-40 mL Intracatheter Q12H  . sodium chloride flush  3 mL Intravenous Q12H   Continuous Infusions: . sodium chloride 250 mL (04/29/20 2058)  . DAPTOmycin (CUBICIN)  IV Stopped (04/29/20 2000)  . feeding supplement (OSMOLITE 1.5 CAL) 50 mL/hr at 04/30/20 1900  . levETIRAcetam Stopped (04/30/20 2144)  . piperacillin-tazobactam (ZOSYN)  IV Stopped (05/01/20 0150)  . 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)   Chronic diastolic heart failure (HCC)   Diarrhea   Renal insufficiency   Hypertension associated with diabetes (HCC)   Paroxysmal atrial fibrillation (HCC)   Hypothyroidism   Symptomatic anemia   Abdominal pain   CHF (congestive heart failure) ([place])   Acute metabolic encephalopathy   Other cirrhosis of liver ([place])   Palliative care by specialist   Goals of care, counseling/discussion   Discitis   Status epilepticus ([place])   Malnutrition of moderate degree   Nonconvulsive status epilepticus/Acute encephalopathy Patient presenting from [facility] with altered mental status.  CT head with no acute intracranial findings.  MR brain with no acute findings.  Urinalysis unrevealing.  Ammonia level 21.  Continuous EEG showed frontally predominant sharp wave discharges. Neurology was concerned about need for further escalation of care and requesting ICU transfer, PCCM consulted. Neurology following-Routine EEG was concerning for nonconvulsive status epilepticus which has since improved after adding Keppra and Depakote. -LTM EEG  now showingSIRPIDSwhich has not significantly changed after adjusting AEDs and therefore is most likely interictal. SIRPIDS most likely 2/2 to cephalosporin toxicity and infection in setting of renal dynsfunction 12/2-encephalopathy slowly improving seziure improved after adding keppra and  Depakote. 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 parkinsons dz per neurology. Seizure precautions Continue TF,n.p.o. 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  Underwent IR lumbar disc aspiration on 04/22/2020 with 2 mL turbid serosanguineous fluid obtained.  12/2-ID following Continue dapsone Zosyn for now Tentative plan for 6 weeks total, and place 06/04/2020  Providencia stuartii and VRE UTI Completed course of antibiotics  Repeat urine culture NTD  AKI on CKD stage IV Baseline creatinine 1.5-1.8. --Cr 2.30>>1.41>>4.82>>2.08>2.26>>>2.36>>2.54 On free water . If creatinine continues to go up, will add ivf. Avoid nephrotoxins, renally dose all medications Continue to monitor renal function   Symptomatic anemia Hemoglobin 3.5 on admission. FOBT positive. --Hgb 3.5>3.1>10.0>>>8.1 Appears to be stabilizing Ferrous sulfate daily PPI daily Monitor as patient is on Eliquis   Cirrhosis, new diagnosis Incidental finding of cirrhosis on CT abdomen/pelvis.  MR abdomen degraded exam however no concerning lesions.  Acute hepatitis panel with equivocal results for hepatitis A antibody.   -We will need GI follow-up as outpatient with repeat hepatitis panel    Type 2 diabetes mellitus Hemoglobin A1c 5.6 on 03/30/2020, well controlled. Home regimen includes Levemir 20 units subcutaneously daily, Januvia 25 mg p.o. daily 12/2-BG elevated, will add lantus 5units daily, increase as needed. Current on TF Continue on RISS   Chronic diastolic congestive heart failure, compensated TTE 03/30/2020, LVEF 65-70%, no LV regional wall motion abnormalities, mild concentric LVH, grade  1 diastolic dysfunction, mild MR, moderate TR, moderate aortic valve sclerosis without evidence of aortic stenosis, IVC dilated.  12/1-more on dry side.  Bun /creatinine up a bit. Hold lasix, start free water for hydration Continue beta blk I/o Daily weight  Essential hypertension Paroxysmal atrial fibrillation --Diltiazem 120 mg p.o. daily --Metoprolol tartrate 25 mg BID --Apixaban 2.5 mg twice daily (was on Xarelto outpatient but changed due to renal function)  Hypothyroidism:  --Levothyroxine 88 mcg p.o. daily, may need to convert to IV form if AMS persists  Hx Gout: on allopurinol and colchisine      DVT prophylaxis: Eliquis Code Status: DNR Family Communication: None at bedside  Status is: Inpatient  Remains inpatient appropriate because:IV treatments appropriate due to intensity of illness or inability to take PO   Dispo:  Patient From: Home  Planned Disposition: SNF  Expected discharge date:TBD   Medically Not medically stable for discharge: Still with encephalopathy, on TF. Creatinine going up.            LOS: 33 days   Time spent: 35 minutes with more than 50% on [place], MD [facility] Pager [phone]  If 7PM-7AM, please contact night-coverage [email or web address] Password [facility] 05/01/2020, 8:23 AM

## 2020-05-02 LAB — GLUCOSE, CAPILLARY
Glucose-Capillary: 280 mg/dL — ABNORMAL HIGH (ref 70–99)
Glucose-Capillary: 273 mg/dL — ABNORMAL HIGH (ref 70–99)
Glucose-Capillary: 293 mg/dL — ABNORMAL HIGH (ref 70–99)
Glucose-Capillary: 279 mg/dL — ABNORMAL HIGH (ref 70–99)
Glucose-Capillary: 278 mg/dL — ABNORMAL HIGH (ref 70–99)
Glucose-Capillary: 311 mg/dL — ABNORMAL HIGH (ref 70–99)

## 2020-05-02 LAB — URINALYSIS, COMPLETE (UACMP) WITH MICROSCOPIC
Bilirubin Urine: NEGATIVE
Glucose, UA: NEGATIVE mg/dL
Hgb urine dipstick: NEGATIVE
Ketones, ur: NEGATIVE mg/dL
Nitrite: NEGATIVE
Protein, ur: 100 mg/dL — AB
Specific Gravity, Urine: 1.025 (ref 1.005–1.030)
WBC, UA: >50 WBC/hpf — ABNORMAL HIGH (ref 0–5)
pH: 5 (ref 5.0–8.0)

## 2020-05-02 LAB — BASIC METABOLIC PANEL
Anion gap: 15 (ref 5–15)
BUN: 36 mg/dL — ABNORMAL HIGH (ref 8–23)
CO2: 24 mmol/L (ref 22–32)
Calcium: 8.5 mg/dL — ABNORMAL LOW (ref 8.9–10.3)
Chloride: 107 mmol/L (ref 98–111)
Creatinine, Ser: 3.19 mg/dL — ABNORMAL HIGH (ref 0.44–1.00)
GFR, Estimated: 15 mL/min — ABNORMAL LOW (ref >60)
Glucose, Bld: 314 mg/dL — ABNORMAL HIGH (ref 70–99)
Potassium: 3.8 mmol/L (ref 3.5–5.1)
Sodium: 146 mmol/L — ABNORMAL HIGH (ref 135–145)

## 2020-05-02 LAB — CBC
HCT: 25.4 % — ABNORMAL LOW (ref 36.0–46.0)
Hemoglobin: 8.2 g/dL — ABNORMAL LOW (ref 12.0–15.0)
MCH: 25.7 pg — ABNORMAL LOW (ref 26.0–34.0)
MCHC: 32.3 g/dL (ref 30.0–36.0)
MCV: 79.6 fL — ABNORMAL LOW (ref 80.0–100.0)
Platelets: 264 10*3/uL (ref 150–400)
RBC: 3.19 MIL/uL — ABNORMAL LOW (ref 3.87–5.11)
RDW: 30.7 % — ABNORMAL HIGH (ref 11.5–15.5)
WBC: 13.8 10*3/uL — ABNORMAL HIGH (ref 4.0–10.5)
nRBC: 0.1 % (ref 0.0–0.2)

## 2020-05-02 LAB — AMMONIA: Ammonia: 33 umol/L (ref 9–35)

## 2020-05-02 LAB — SODIUM, URINE, RANDOM: Sodium, Ur: 31 mmol/L

## 2020-05-02 LAB — CREATININE, URINE, RANDOM: Creatinine, Urine: 93.23 mg/dL

## 2020-05-02 LAB — MAGNESIUM: Magnesium: 2.3 mg/dL (ref 1.7–2.4)

## 2020-05-02 MED ORDER — INSULIN DETEMIR 100 UNIT/ML ~~LOC~~ SOLN
10.0000 [IU] | Freq: Every day | SUBCUTANEOUS | Status: DC
  Administered 2020-05-02: 10 [IU] via SUBCUTANEOUS
  Filled 2020-05-02 (×2): qty 0.1

## 2020-05-02 MED ORDER — JUVEN PO PACK
1.0000 | PACK | Freq: Two times a day (BID) | ORAL | Status: DC
  Administered 2020-05-02 – 2020-05-04 (×4): 1
  Filled 2020-05-02 (×5): qty 1

## 2020-05-02 NOTE — Plan of Care (Signed)

## 2020-05-02 NOTE — Care Management Important Message (Signed)
Important Message  Patient Details  Name: Sarah Colon MRN: [id number] Date of Birth: 03-20-1946   Medicare Important Message Given:  Yes     Zenon Mayo, RN 05/02/2020, 6:40 PM

## 2020-05-02 NOTE — Plan of Care (Signed)
  Problem: Clinical Measurements: Goal: Ability to maintain clinical measurements within normal limits will improve Outcome: Progressing Goal: Will remain free from infection Outcome: Progressing Goal: Diagnostic test results will improve Outcome: Progressing Goal: Respiratory complications will improve Outcome: Progressing Goal: Cardiovascular complication will be avoided Outcome: Progressing   Problem: Coping: Goal: Level of anxiety will decrease Outcome: Progressing   Problem: Elimination: Goal: Will not experience complications related to bowel motility Outcome: Progressing   Problem: Pain Managment: Goal: General experience of comfort will improve Outcome: Progressing

## 2020-05-02 NOTE — Progress Notes (Signed)
PROGRESS NOTE    Sarah Colon  CHE:[id number] DOB: Sep 15, 1945 DOA: 03/10/2020 PCP: Patient, No Pcp Per    Brief Narrative:  Sarah Colon is a 74 year old female with past medical history significant for type 2 diabetes mellitus, chronic diastolic congestive heart failure, essential hypertension, paroxysmal atrial fibrillation, CKD stage IV who initially presented to [facility] from SNF with shortness of breath, chest pain, and fatigue.  Patient with poor appetite and was found to have a hemoglobin of 3 on admission. FOBT was positive in the ED and GI was consulted with possible admission to the hospitalist service.  She underwent EGD and colonoscopy without evidence of bleeding; but multiple polypectomies were performed.  During the hospital course, patient was treated for right lung base pneumonia and prevent C/Enterococcus faecalis UTI in which she completed a course of antibiotics.  Additionally, patient had a complaint of abdominal pain with CT abdomen/pelvis on 04/04/2020 with presacral and rectal findings associated with cortical erosion, sclerosis of unclear etiology.  MRI L-spine consistent with discitis/osteomyelitis.  Patient underwent IR guided aspiration of L4-5 disc with cultures showing no growth.  Infectious disease was consulted and recommended 6-week course of daptomycin and cefepime.  Cefepime was changed to Zosyn due to concerns of AKI as well as possible contributing factor releasing to status epilepticus. Patient was transferred to [facility] for further and neurology evaluation and continuous EEG on 04/27/2020.  11/28:Patient continues to be nonresponsive to verbal command and somnolent.  Received IV loading Keppra and valproate early this morning.  Continues on continuous EEG.  Neurology present at bedside; EEG continues to show seizure activity.  Neurology giving 2 mg IV Ativan and 2 g IV Keppra.  Given her persistent status with likely inability to  protect airway and need for escalation of care, neurology recommends transfer to ICU for higher level of care.  PCCM consulted for evaluation.  12/1- PCCM pickup. Pt remains mostly nonverbal, but noxious stimuli she may moan. Neurology by bedside. Pt with intentional tremor of left leg.  12/3-renal function worsening, consulted nephrology  Consultants:   Neurology, PCCM, ID  Procedures:  IR guided aspiration of LP/L5 disc cEEG 11/29 - Concering for SIRPIDS  Antimicrobials:  cefepime 11/24>>11/27 Daptomycin 11/24 >>  Zosyn 11/28 >>    Subjective: Patient's eyes opens when I call her name.  Per nursing she is having a lot of diarrhea.  Ammonia level currently is normal.  Objective: Vitals:   05/01/20 2326 05/02/20 0358 05/02/20 0532 05/02/20 0727  BP: (!) 147/67 138/61  (!) 161/70  Pulse: 83 87  92  Resp: 20 20  (!) 22  Temp: 99.1 F (37.3 C) 99 F (37.2 C)  98.7 F (37.1 C)  TempSrc: Axillary Axillary  Oral  SpO2: 99% 98%    Weight:   71.8 kg   Height:        Intake/Output Summary (Last 24 hours) at 05/02/2020 0843 Last data filed at 05/01/2020 1900 Gross per 24 hour  Intake 1228.09 ml  Output --  Net 1228.09 ml   Filed Weights   04/30/20 0327 05/01/20 0500 05/02/20 0532  Weight: 67.4 kg 68.6 kg 71.8 kg    Examination: Opens eyes but not speaking today. Poor resp effort, cta no wheezing Regular s1/s2 Soft benign No edema b/l Unable to assess neuro or psych    Data Reviewed: I have personally reviewed following labs and imaging studies  CBC: Recent Labs  Lab 04/26/20 0446 04/27/20 0908 04/28/20 0459 05/02/20  0450  WBC 16.3* 15.2* 12.8* 13.8*  NEUTROABS 11.4*  --   --   --   HGB 8.4* 8.2* 8.1* 8.2*  HCT 26.1* 26.4* 25.5* 25.4*  MCV 78.4* 79.8* 78.9* 79.6*  PLT 311 292 278 237   Basic Metabolic Panel: Recent Labs  Lab 04/26/20 0446 04/27/20 0908 04/28/20 1317 04/28/20 1641 04/29/20 0334 04/30/20 0353 05/01/20 0320 05/02/20 0450  NA  146*   < > 145  --  141 147* 147* 146*  K 3.3*   < > 3.1*  --  3.5 4.4 3.9 3.8  CL 106   < > 105  --  106 108 106 107  CO2 26   < > 28  --  27 26 24 24   GLUCOSE 93   < > 86  --  143* 241* 243* 314*  BUN 32*   < > 23  --  23 25* 27* 36*  CREATININE 2.08*   < > 2.26*  --  2.26* 2.36* 2.54* 3.19*  CALCIUM 8.3*   < > 8.2*  --  7.8* 8.5* 8.7* 8.5*  MG 1.7   < >  --  1.9 2.0 2.1 2.1 2.3  PHOS 4.0  --   --   --   --   --   --   --    < > = values in this interval not displayed.   GFR: Estimated Creatinine Clearance: 13.6 mL/min (A) (by C-G formula based on SCr of 3.19 mg/dL (H)). Liver Function Tests: Recent Labs  Lab 04/26/20 0446  AST 38  ALT 13  ALKPHOS 172*  BILITOT 0.8  PROT 6.5  ALBUMIN 2.2*   No results for input(s): LIPASE, AMYLASE in the last 168 hours. Recent Labs  Lab 04/26/20 0709 04/29/20 1608 05/02/20 0450  AMMONIA 21 37* 33   Coagulation Profile: No results for input(s): INR, PROTIME in the last 168 hours. Cardiac Enzymes: Recent Labs  Lab 04/29/20 0334  CKTOTAL 20*   BNP (last 3 results) No results for input(s): PROBNP in the last 8760 hours. HbA1C: No results for input(s): HGBA1C in the last 72 hours. CBG: Recent Labs  Lab 05/01/20 2008 05/01/20 2149 05/02/20 0059 05/02/20 0357 05/02/20 0731  GLUCAP 297* 262* 280* 273* 293*   Lipid Profile: No results for input(s): CHOL, HDL, LDLCALC, TRIG, CHOLHDL, LDLDIRECT in the last 72 hours. Thyroid Function Tests: No results for input(s): TSH, T4TOTAL, FREET4, T3FREE, THYROIDAB in the last 72 hours. Anemia Panel: No results for input(s): VITAMINB12, FOLATE, FERRITIN, TIBC, IRON, RETICCTPCT in the last 72 hours. Sepsis Labs: No results for input(s): PROCALCITON, LATICACIDVEN in the last 168 hours.  Recent Results (from the past 240 hour(s))  Aerobic/Anaerobic Culture (surgical/deep wound)     Status: None   Collection Time: 04/22/20  4:10 PM   Specimen: Abscess  Result Value Ref Range Status    Specimen Description   Final    ABSCESS L4/5 DISC ASPIRATION Performed at [facility] [address], [place], [place] [address]    Special Requests   Final    Normal Performed at [facility], [place] [address], [place], [place] [address]    Gram Stain NO WBC SEEN NO ORGANISMS SEEN   Final   Culture   Final    No growth aerobically or anaerobically. Performed at [facility], [place] [address] [place], [place] [address]    Report Status 04/27/2020 FINAL  Final  Culture, Urine  Status: None   Collection Time: 04/26/20  1:51 PM   Specimen: Urine, Clean Catch  Result Value Ref Range Status   Specimen Description   Final    URINE, CLEAN CATCH Performed at [facility], [place] [address], [place], [place] [address]    Special Requests   Final    NONE Performed at [facility], [place] [address], [place], [place] [address]    Culture   Final    NO GROWTH Performed at [facility], [place] [address], [place], [place] [address]    Report Status 04/27/2020 FINAL  Final  MRSA PCR Screening     Status: None   Collection Time: 05/01/20  7:58 AM   Specimen: Nasal Mucosa; Nasopharyngeal  Result Value Ref Range Status   MRSA by PCR NEGATIVE NEGATIVE Final    Comment:        The GeneXpert MRSA Assay (FDA approved for NASAL specimens only), is one component of a comprehensive MRSA colonization surveillance program. It is not intended to diagnose MRSA infection nor to guide or monitor treatment for MRSA infections. Performed at [facility], [place] [address], [place], [place] [address]          Radiology Studies: No results found.      Scheduled Meds: . apixaban  2.5 mg Per Tube BID  . Chlorhexidine Gluconate Cloth  6 each Topical Daily  . diclofenac Sodium  2 g Topical QID  . diltiazem  30 mg Per Tube Q6H  . feeding supplement (PROSource TF)  45 mL Per Tube BID  . ferrous sulfate   325 mg Per Tube Q breakfast  . free water  200 mL Per Tube Q8H  . Gerhardt's butt cream   Topical BID  . hydrALAZINE  50 mg Per Tube Q8H  . insulin aspart  0-9 Units Subcutaneous Q4H  . insulin detemir  5 Units Subcutaneous Daily  . lactulose  10 g Per Tube BID  . levothyroxine  88 mcg Per Tube Q0600  . metoprolol tartrate  50 mg Per Tube BID  . pantoprazole sodium  40 mg Per Tube QHS  . sodium bicarbonate  1,300 mg Per Tube TID  . sodium chloride flush  10-40 mL Intracatheter Q12H  . sodium chloride flush  3 mL Intravenous Q12H   Continuous Infusions: . sodium chloride 50 mL/hr at 05/01/20 1700  . sodium chloride 250 mL (04/29/20 2058)  . DAPTOmycin (CUBICIN)  IV 220 mL/hr at 05/01/20 1900  . feeding supplement (OSMOLITE 1.5 CAL) 50 mL/hr at 05/01/20 1900  . levETIRAcetam 500 mg (05/01/20 2153)  . piperacillin-tazobactam (ZOSYN)  IV 3.375 g (05/01/20 2306)  . 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)   Chronic diastolic heart failure (HCC)   Diarrhea   Renal insufficiency   Hypertension associated with diabetes (HCC)   Paroxysmal atrial fibrillation (HCC)   Hypothyroidism   Symptomatic anemia   Abdominal pain   CHF (congestive heart failure) ([place])   Acute metabolic encephalopathy   Other cirrhosis of liver ([place])   Palliative care by specialist   Goals of care, counseling/discussion   Discitis   Status epilepticus ([place])   Malnutrition of moderate degree   Nonconvulsive status epilepticus/Acute encephalopathy Patient presenting [facility] with altered mental status.  CT head with no acute intracranial findings.  MR brain  with no acute findings.  Urinalysis unrevealing.  Ammonia level 21.  Continuous EEG showed frontally predominant sharp wave discharges. Neurology was concerned about need for further escalation of care and requesting ICU transfer,  PCCM consulted. Neurology following-Routine EEG was concerning for nonconvulsive status epilepticus which has since improved after adding Keppra and Depakote. -LTM EEG now showingSIRPIDSwhich has not significantly changed after adjusting AEDs and therefore is most likely interictal. SIRPIDS most likely 2/2 to cephalosporin toxicity and infection in setting of renal dynsfunction 12/3-encephalopathy with slow improvement Seizure improved after adding Keppra and Depakote If patient has any further clinical seizures, will add Vimpat Patient has mild tremulousness and rigidity suggestive of parkinsonian is likely due to infection, drug-induced versus Parkinson's disease per neurology Seizure precautions Continue TF, n.p.o. 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  Underwent IR lumbar disc aspiration on 04/22/2020 with 2 mL turbid serosanguineous fluid obtained.  ID following Continue dapsone, Zosyn for now Tentative plan for 6 weeks total, until 06/04/2018    AKI on CKD stage IV Baseline creatinine 1.5-1.8. --Cr 2.30>>1.41>>4.82>>2.08>2.26>>>2.36>>2.54>>>3.19 Acutely patient is likely prerenal given poor p.o. intake and having diarrhea. Nephrology was consulted due to worsening of renal failure, input was appreciated.  They also agree with prerenal azotemia. Nephrology recommends continuing IV fluid, bladder scan and perform I&O cath if PVR more than 300 She is not a candidate for dialysis and will continue with conservative care Renal dose meds Avoid nephrotoxic agents such as NSAIDs, Cox 2, Fleet enema, IV contrast   Diarrhea- Likely from TF and lactulose Will stop lactulose and use prn . Ck ammonia level prn    Symptomatic anemia Hemoglobin 3.5 on admission. FOBT positive. --Hgb  3.5>3.1>10.0>>>8.2 Appears to be stabilizing Ferrous sulfate daily PPI daily Monitor as patient is on Eliquis    Providencia stuartii and VRE UTI Completed course of antibiotics  Repeat urine culture NTD   Cirrhosis, new diagnosis Incidental finding of cirrhosis on CT abdomen/pelvis.  MR abdomen degraded exam however no concerning lesions.  Acute hepatitis panel with equivocal results for hepatitis A antibody.   -We will need GI follow-up as outpatient with repeat hepatitis panel    Type 2 diabetes mellitus Hemoglobin A1c 5.6 on 03/30/2020, well controlled. Home regimen includes Levemir 20 units subcutaneously daily, Januvia 25 mg p.o. daily 12/3-glucose levels are still elevated  Will increase Lantus to 10 units daily Continue R-ISS     Chronic diastolic congestive heart failure, compensated TTE 03/30/2020, LVEF 65-70%, no LV regional wall motion abnormalities, mild concentric LVH, grade 1 diastolic dysfunction, mild MR, moderate TR, moderate aortic valve sclerosis without evidence of aortic stenosis, IVC dilated.  12/1-more on dry side.  Bun /creatinine up a bit. Hold lasix, start free water for hydration Continue beta blk I/o Daily weight  Essential hypertension Paroxysmal atrial fibrillation --Diltiazem 120 mg p.o. daily --Metoprolol tartrate 25 mg BID --Apixaban 2.5 mg twice daily (was on Xarelto outpatient but changed due to renal function)  Hypothyroidism:  --Levothyroxine 88 mcg p.o. daily, may need to convert to IV form if AMS persists  Hx Gout: on allopurinol and colchisine      DVT prophylaxis: Eliquis Code Status: DNR Family Communication: None at bedside  Status is: Inpatient  Remains inpatient appropriate because:IV treatments appropriate due to intensity of illness or inability to take PO   Dispo:  Patient From: Home  Planned Disposition: SNF  Expected discharge date:TBD   Medically Not medically stable for discharge: Still with  encephalopathy, on TF. Creatinine going up.            LOS: 34 days   Time spent: 45 minutes with more than 50% on [place], MD [facility] Pager [phone]  If 7PM-7AM, please contact night-coverage [email or web address] Password [facility] 05/02/2020, 8:43 AM

## 2020-05-02 NOTE — TOC Progression Note (Signed)
Transition of Care [facility]) - Progression Note    Patient Details  Name: Sarah Colon MRN: [id number] Date of Birth: December 27, 1945  Transition of Care [facility]) CM/SW [place], [place] Phone Number: 05/02/2020, 1:23 PM  Clinical Narrative:    CSW reached out to [place] at [facility] to talk about pt dc plans. Roselyn Reef confirmed pt was long term at SNF but family didn't do a bed hold so pt may or may not have a bed at dc. Roselyn Reef states if there is availability they will be able to take pt back. Roselyn Reef confirmed that pt also has Medicaid. CSW will continue to follow.    Expected Discharge Plan: [place] Barriers to Discharge: Continued Medical Work up  Expected Discharge Plan and Services Expected Discharge Plan: [place]   Discharge Planning Services: CM Consult Post Acute Care Choice: Resumption of Svcs/PTA Provider Living arrangements for the past 2 months: [place] Expected Discharge Date: 04/19/20               DME Arranged: N/A         HH Arranged: NA           Social Determinants of Health (SDOH) Interventions    Readmission Risk Interventions Readmission Risk Prevention Plan 04/14/2020  Transportation Screening Complete  Medication Review Press photographer) Complete  PCP or Specialist appointment within 3-5 days of discharge Complete  HRI or Home Care Consult Complete  SW Recovery Care/Counseling Consult Complete  Palliative Care Screening Not [place] Complete  Some recent data might be hidden

## 2020-05-02 NOTE — Progress Notes (Signed)
[facility] ([facility]) Community Based Palliative Care       This patient has been referred to our palliative care services in the community.  [facility] will continue to follow for any discharge planning needs and to coordinate admission onto palliative care.   If you have questions or need assistance, please call [phone] or contact the hospital Liaison listed on AMION.     Thank you for the opportunity to participate in this patient's care.     Chrislyn King, BSN, RN [facility] Hospital Liaison   [phone] (24h on call) 

## 2020-05-02 NOTE — Progress Notes (Signed)
Nutrition Follow-up  DOCUMENTATION CODES:   Non-severe (moderate) malnutrition in context of chronic illness  INTERVENTION:   Continue tube feeds via Cortrak: - Osmolite 1.5 @ 50 ml/hr (1200 ml/day) - ProSource TF 45 ml BID  Tube feeding regimen provides 1880 kcal, 97 grams of protein, and 914 ml of H2O.  - 1 packet Juven BID per tube, each packet provides 95 calories, 2.5 grams of protein, and 9.8 grams of carbohydrate; also contains 7 grams of L-arginine and L-glutamine, 300 mg vitamin C, 15 mg vitamin E, 1.2 mcg vitamin B-12, 9.5 mg zinc, 200 mg calcium, and 1.5 g calcium Beta-hydroxy-Beta-methylbutyrate to support wound healing  NUTRITION DIAGNOSIS:   Moderate Malnutrition related to chronic illness (CHF) as evidenced by mild fat depletion, moderate muscle depletion.  Ongoing  GOAL:   Patient will meet greater than or equal to 90% of their needs  Met via TF  MONITOR:   Diet advancement, Labs, Weight trends, TF tolerance, Skin, I & O's  REASON FOR ASSESSMENT:   LOS, Consult Enteral/tube feeding initiation and management  ASSESSMENT:   74 year old female with PMH of T2DM, CHF, HTN, atrial fibrillation who initially presented to [facility] from SNF with SOB, chest pain, and fatigue. Pt with poor appetite and was found to have a hemoglobin of 3 on admission. FOBT was positive in the ED and GI was consulted. Pt underwent EGD and colonoscopy without evidence of bleeding, but multiple polypectomies were performed. During the hospital course, pt was treated for right lung base pneumonia and UTI. Additionally, pt a CT abdomen/pelvis on 04/04/20 with presacral and rectal findings associated with cortical erosion, sclerosis of unclear etiology. MRI L-spine consistent with discitis/osteomyelitis. Pt underwent IR guided aspiration of L4-5 disc with cultures showing no growth. Pt found to be unresponsive with status epilepticus and [facility]was [facility] for [facility]y evaluation and continuous  EEG on 11/28.  11/29 - Cortrak placed, tip gastric per Cortrak team  Pt remains NPO with Cortrak in place and TF infusing.  Attempted to speak with pt at bedside. Pt did open eyes to RD touch and voice but did not attempt to communicate. TF infusing as ordered. RD to add Juven per tube to aid in wound healing.  Admit weight: 60.9 kg Current weight: 71.8 kg  Pt with +1 pitting edema to BUE and +3 pitting edema to BLE. Weight gain since admission likely related to fluid status.  TF regimen: Osmolite 1.5 @ 50 ml/hr, ProSource TF 45 ml BID, free water 200 ml q 8 hours  Medications reviewed and include: ferrous sulfate, SSI q 4 hours, levemir 10 units daily, lactulose, protonix, sodium bicarb, IV abx  Labs reviewed: sodium 146, BUN 36, creatinine 3.19, hemoglobin 8.2 CBG's: 248-297 x 24 hours  Diet Order:   Diet Order            Diet NPO time specified  Diet effective now           Diet - low sodium heart healthy                 EDUCATION NEEDS:   Not appropriate for education at this time  Skin:  Skin Assessment: Skin Integrity Issues: Stage III: sacrum  Last BM:  05/01/20  Height:   Ht Readings from Last 1 Encounters:  04/26/20 $RemoveB'5\' 1"'[id number]$  (1.549 m)    Weight:   Wt Readings from Last 1 Encounters:  05/02/20 71.8 kg    BMI:  Body mass index is 29.91 kg/m.  Estimated  Nutritional Needs:   Kcal:  1700-1900  Protein:  85-100 grams  Fluid:  1.7-1.9 L    Gustavus Bryant, MS, RD, LDN Inpatient Clinical Dietitian Please see AMiON for contact information.

## 2020-05-02 NOTE — Progress Notes (Signed)
Patient ID: BRITYN MASTROGIOVANNI, female   DOB: January 04, 1946, 74 y.o.   MRN: [id number] S: Pt with a very complex past medical history and complicated and extended hospitalization.  She was initially seen by our practice on 04/07/20 for AKI/CKD and signed off on 04/17/20 when her Scr stabilized.  She has also been deemed not suitable candidate for dialysis given her multiple co-morbidities, and poor functional/nutritional status.  She also has h/o refusing multiple interventions.  We were reconsulted after she developed recurrent AKI/CKD.  She has not been taking anything by mouth and no UOP recorded for the past 48 hours.   O:BP 132/67   Pulse 75   Temp 98.7 F (37.1 C) (Oral)   Resp (!) 24   Ht 5\' 1"  (1.549 m)   Wt 71.8 kg   SpO2 99%   BMI 29.91 kg/m   Intake/Output Summary (Last 24 hours) at 05/02/2020 1316 Last data filed at 05/02/2020 0900 Gross per 24 hour  Intake 2455.83 ml  Output --  Net 2455.83 ml   Intake/Output: I/O last 3 completed shifts: In: 2058.1 [I.V.:224.6; NG/GT:1300; IV Piggyback:533.5] Out: -   Intake/Output this shift:  Total I/O In: 1227.7 [I.V.:696.4; NG/GT:300; IV Piggyback:231.4] Out: -  Weight change: 3.2 kg Gen: chronically ill appearing female, nonverbal and only moans CVS: RRR Resp: bilateral rhonchi Abd: +BS, soft but moans to palpation Ext: trace pretibial edema  Recent Labs  Lab 04/26/20 0446 04/26/20 0446 04/27/20 0908 04/28/20 0326 04/28/20 1317 04/29/20 0334 04/30/20 0353 05/01/20 0320 05/02/20 0450  NA 146*  --  144  --  145 141 147* 147* 146*  K 3.3*  --  3.1*  --  3.1* 3.5 4.4 3.9 3.8  CL 106  --  100  --  105 106 108 106 107  CO2 26  --  27  --  28 27 26 24 24   GLUCOSE 93  --  127*  --  86 143* 241* 243* 314*  BUN 32*  --  27*  --  23 23 25* 27* 36*  CREATININE 2.08*   < > 2.26* 2.19* 2.26* 2.26* 2.36* 2.54* 3.19*  ALBUMIN 2.2*  --   --   --   --   --   --   --   --   CALCIUM 8.3*  --  8.1*  --  8.2* 7.8* 8.5* 8.7* 8.5*  PHOS 4.0   --   --   --   --   --   --   --   --   AST 38  --   --   --   --   --   --   --   --   ALT 13  --   --   --   --   --   --   --   --    < > = values in this interval not displayed.   Liver Function Tests: Recent Labs  Lab 04/26/20 0446  AST 38  ALT 13  ALKPHOS 172*  BILITOT 0.8  PROT 6.5  ALBUMIN 2.2*   No results for input(s): LIPASE, AMYLASE in the last 168 hours. Recent Labs  Lab 04/26/20 0709 04/29/20 1608 05/02/20 0450  AMMONIA 21 37* 33   CBC: Recent Labs  Lab 04/26/20 0446 04/26/20 0446 04/27/20 0908 04/28/20 0459 05/02/20 0450  WBC 16.3*   < > 15.2* 12.8* 13.8*  NEUTROABS 11.4*  --   --   --   --  HGB 8.4*   < > 8.2* 8.1* 8.2*  HCT 26.1*   < > 26.4* 25.5* 25.4*  MCV 78.4*  --  79.8* 78.9* 79.6*  PLT 311   < > 292 278 264   < > = values in this interval not displayed.   Cardiac Enzymes: Recent Labs  Lab 04/29/20 0334  CKTOTAL 20*   CBG: Recent Labs  Lab 05/01/20 2149 05/02/20 0059 05/02/20 0357 05/02/20 0731 05/02/20 1149  GLUCAP 262* 280* 273* 293* 279*    Iron Studies: No results for input(s): IRON, TIBC, TRANSFERRIN, FERRITIN in the last 72 hours. Studies/Results: No results found. Marland Kitchen apixaban  2.5 mg Per Tube BID  . Chlorhexidine Gluconate Cloth  6 each Topical Daily  . diclofenac Sodium  2 g Topical QID  . diltiazem  30 mg Per Tube Q6H  . feeding supplement (PROSource TF)  45 mL Per Tube BID  . ferrous sulfate  325 mg Per Tube Q breakfast  . free water  200 mL Per Tube Q8H  . Gerhardt's butt cream   Topical BID  . hydrALAZINE  50 mg Per Tube Q8H  . insulin aspart  0-9 Units Subcutaneous Q4H  . insulin detemir  10 Units Subcutaneous Daily  . lactulose  10 g Per Tube BID  . levothyroxine  88 mcg Per Tube Q0600  . metoprolol tartrate  50 mg Per Tube BID  . nutrition supplement (JUVEN)  1 packet Per Tube BID BM  . pantoprazole sodium  40 mg Per Tube QHS  . sodium bicarbonate  1,300 mg Per Tube TID  . sodium chloride flush  10-40 mL  Intracatheter Q12H  . sodium chloride flush  3 mL Intravenous Q12H    BMET    Component Value Date/Time   NA 146 (H) 05/02/2020 0450   NA 142 05/16/2013 1346   K 3.8 05/02/2020 0450   K 3.9 05/16/2013 1346   CL 107 05/02/2020 0450   CO2 24 05/02/2020 0450   CO2 24 05/16/2013 1346   GLUCOSE 314 (H) 05/02/2020 0450   GLUCOSE 155 (H) 05/16/2013 1346   BUN 36 (H) 05/02/2020 0450   BUN 29.9 (H) 05/16/2013 1346   CREATININE 3.19 (H) 05/02/2020 0450   CREATININE 2.19 (H) 10/24/2019 1454   CREATININE 1.2 (H) 05/16/2013 1346   CALCIUM 8.5 (L) 05/02/2020 0450   CALCIUM 10.3 05/16/2013 1346   GFRNONAA 15 (L) 05/02/2020 0450   GFRNONAA 21 (L) 10/24/2019 1454   GFRAA 40 (L) 12/25/2019 1502   GFRAA 25 (L) 10/24/2019 1454   CBC    Component Value Date/Time   WBC 13.8 (H) 05/02/2020 0450   RBC 3.19 (L) 05/02/2020 0450   HGB 8.2 (L) 05/02/2020 0450   HGB 13.4 12/23/2014 0908   HCT 25.4 (L) 05/02/2020 0450   HCT 39.8 12/23/2014 0908   PLT 264 05/02/2020 0450   PLT 333 12/23/2014 0908   MCV 79.6 (L) 05/02/2020 0450   MCV 85.9 12/23/2014 0908   MCH 25.7 (L) 05/02/2020 0450   MCHC 32.3 05/02/2020 0450   RDW 30.7 (H) 05/02/2020 0450   RDW 14.6 (H) 12/23/2014 0908   LYMPHSABS 2.7 04/26/2020 0446   LYMPHSABS 1.4 12/23/2014 0908   MONOABS 1.0 04/26/2020 0446   MONOABS 0.6 12/23/2014 0908   EOSABS 1.0 (H) 04/26/2020 0446   EOSABS 0.4 12/23/2014 0908   BASOSABS 0.1 04/26/2020 0446   BASOSABS 0.1 12/23/2014 0908     Assessment/Plan:  1. AKI/CKD stage IV- agree with Dr.  Kurtis Bushman that this is likely prerenal given poor po intake.  No recent IV contrasted studies, nephrotoxic agents, or recent episode of hypotension.  Agree with IVF's and follow UOP and Scr.   1. Will also order bladder scan and perform I&O cath if PVR>300.   2. She is not a candidate for dialysis and will continue with conservative care. 3. Renal dose meds 4. Avoid nephrotoxic agents such as NSAIDs/COX-II I's, Fleets  enema, IV contrast. 2. 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 3. 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 UTI- per primary and ID 5. HTN 6. Cirrhosis- incidental finding on imaging. 7. Chronic diastolic CHF- stable 8. P. afib- per priamry 9. Disposition- poor overall prognosis and Palliative care has been following.  Donetta Potts, MD [employer] [phone]

## 2020-05-03 ENCOUNTER — Inpatient Hospital Stay (HOSPITAL_COMMUNITY): Payer: Medicare Other

## 2020-05-03 LAB — GLUCOSE, CAPILLARY
Glucose-Capillary: 282 mg/dL — ABNORMAL HIGH (ref 70–99)
Glucose-Capillary: 290 mg/dL — ABNORMAL HIGH (ref 70–99)
Glucose-Capillary: 293 mg/dL — ABNORMAL HIGH (ref 70–99)
Glucose-Capillary: 304 mg/dL — ABNORMAL HIGH (ref 70–99)
Glucose-Capillary: 305 mg/dL — ABNORMAL HIGH (ref 70–99)
Glucose-Capillary: 354 mg/dL — ABNORMAL HIGH (ref 70–99)

## 2020-05-03 LAB — BASIC METABOLIC PANEL
Anion gap: 15 (ref 5–15)
BUN: 45 mg/dL — ABNORMAL HIGH (ref 8–23)
CO2: 25 mmol/L (ref 22–32)
Calcium: 8.6 mg/dL — ABNORMAL LOW (ref 8.9–10.3)
Chloride: 103 mmol/L (ref 98–111)
Creatinine, Ser: 3.46 mg/dL — ABNORMAL HIGH (ref 0.44–1.00)
GFR, Estimated: 13 mL/min — ABNORMAL LOW (ref >60)
Glucose, Bld: 318 mg/dL — ABNORMAL HIGH (ref 70–99)
Potassium: 4.1 mmol/L (ref 3.5–5.1)
Sodium: 143 mmol/L (ref 135–145)

## 2020-05-03 LAB — MAGNESIUM: Magnesium: 2.5 mg/dL — ABNORMAL HIGH (ref 1.7–2.4)

## 2020-05-03 LAB — PATHOLOGIST SMEAR REVIEW

## 2020-05-03 MED ORDER — INSULIN DETEMIR 100 UNIT/ML ~~LOC~~ SOLN
17.0000 [IU] | Freq: Every day | SUBCUTANEOUS | Status: DC
  Administered 2020-05-03 – 2020-05-04 (×2): 17 [IU] via SUBCUTANEOUS
  Filled 2020-05-03 (×2): qty 0.17

## 2020-05-03 NOTE — Progress Notes (Addendum)
PROGRESS NOTE    Sarah Colon  CHY:[id number] DOB: 1946-02-12 DOA: 03/09/2020 PCP: Patient, No Pcp Per    Brief Narrative:  Sarah Colon is a 74 year old female with past medical history significant for type 2 diabetes mellitus, chronic diastolic congestive heart failure, essential hypertension, paroxysmal atrial fibrillation, CKD stage IV who initially presented to [facility] from SNF with shortness of breath, chest pain, and fatigue.  Patient with poor appetite and was found to have a hemoglobin of 3 on admission. FOBT was positive in the ED and GI was consulted with possible admission to the hospitalist service.  She underwent EGD and colonoscopy without evidence of bleeding; but multiple polypectomies were performed.  During the hospital course, patient was treated for right lung base pneumonia and prevent C/Enterococcus faecalis UTI in which she completed a course of antibiotics.  Additionally, patient had a complaint of abdominal pain with CT abdomen/pelvis on 04/04/2020 with presacral and rectal findings associated with cortical erosion, sclerosis of unclear etiology.  MRI L-spine consistent with discitis/osteomyelitis.  Patient underwent IR guided aspiration of L4-5 disc with cultures showing no growth.  Infectious disease was consulted and recommended 6-week course of daptomycin and cefepime.  Cefepime was changed to Zosyn due to concerns of AKI as well as possible contributing factor releasing to status epilepticus. Patient was transferred to [facility] for further and neurology evaluation and continuous EEG on 04/27/2020.  11/28:Patient continues to be nonresponsive to verbal command and somnolent.  Received IV loading Keppra and valproate early this morning.  Continues on continuous EEG.  Neurology present at bedside; EEG continues to show seizure activity.  Neurology giving 2 mg IV Ativan and 2 g IV Keppra.  Given her persistent status with likely inability to  protect airway and need for escalation of care, neurology recommends transfer to ICU for higher level of care.  PCCM consulted for evaluation.  12/1- PCCM pickup. Pt remains mostly nonverbal, but noxious stimuli she may moan. Neurology by bedside. Pt with intentional tremor of left leg.  12/3-renal function worsening, consulted nephrology  12/4-BG levels elevated. Still urinary retention getting I/O cath  Consultants:   Neurology, PCCM, ID  Procedures:  IR guided aspiration of LP/L5 disc cEEG 11/29 - Concering for SIRPIDS  Antimicrobials:  cefepime 11/24>>11/27 Daptomycin 11/24 >>  Zosyn 11/28 >>    Subjective: Pt little more interactive with me today than other days. Smiles intermittently, not much po intake.  Objective: Vitals:   05/03/20 0007 05/03/20 0346 05/03/20 0600 05/03/20 0632  BP: 133/64 138/62 (!) 147/65   Pulse: 92 85 83   Resp: 19 20 (!) 22   Temp: 98.8 F (37.1 C) 98.6 F (37 C)    TempSrc: Oral Oral    SpO2: 99% 99% 99%   Weight:    72.7 kg  Height:        Intake/Output Summary (Last 24 hours) at 05/03/2020 0803 Last data filed at 05/03/2020 0400 Gross per 24 hour  Intake 3200.26 ml  Output --  Net 3200.26 ml   Filed Weights   05/01/20 0500 05/02/20 0532 05/03/20 0632  Weight: 68.6 kg 71.8 kg 72.7 kg    Examination: Nad, little more interactive Anteriorly cta no w/r/r Regular s1/s2 abd distended , decrease bs, nt No edema Mood and affect appropriate in current setting    Data Reviewed: I have personally reviewed following labs and imaging studies  CBC: Recent Labs  Lab 04/27/20 0908 04/28/20 0459 05/02/20 0450  WBC 15.2*  12.8* 13.8*  HGB 8.2* 8.1* 8.2*  HCT 26.4* 25.5* 25.4*  MCV 79.8* 78.9* 79.6*  PLT 292 278 509   Basic Metabolic Panel: Recent Labs  Lab 04/29/20 0334 04/30/20 0353 05/01/20 0320 05/02/20 0450 05/03/20 0402  NA 141 147* 147* 146* 143  K 3.5 4.4 3.9 3.8 4.1  CL 106 108 106 107 103  CO2 27 26 24 24  25   GLUCOSE 143* 241* 243* 314* 318*  BUN 23 25* 27* 36* 45*  CREATININE 2.26* 2.36* 2.54* 3.19* 3.46*  CALCIUM 7.8* 8.5* 8.7* 8.5* 8.6*  MG 2.0 2.1 2.1 2.3 2.5*   GFR: Estimated Creatinine Clearance: 12.6 mL/min (A) (by C-G formula based on SCr of 3.46 mg/dL (H)). Liver Function Tests: No results for input(s): AST, ALT, ALKPHOS, BILITOT, PROT, ALBUMIN in the last 168 hours. No results for input(s): LIPASE, AMYLASE in the last 168 hours. Recent Labs  Lab 04/29/20 1608 05/02/20 0450  AMMONIA 37* 33   Coagulation Profile: No results for input(s): INR, PROTIME in the last 168 hours. Cardiac Enzymes: Recent Labs  Lab 04/29/20 0334  CKTOTAL 20*   BNP (last 3 results) No results for input(s): PROBNP in the last 8760 hours. HbA1C: No results for input(s): HGBA1C in the last 72 hours. CBG: Recent Labs  Lab 05/02/20 1611 05/02/20 1958 05/03/20 0006 05/03/20 0345 05/03/20 0758  GLUCAP 278* 311* 290* 305* 293*   Lipid Profile: No results for input(s): CHOL, HDL, LDLCALC, TRIG, CHOLHDL, LDLDIRECT in the last 72 hours. Thyroid Function Tests: No results for input(s): TSH, T4TOTAL, FREET4, T3FREE, THYROIDAB in the last 72 hours. Anemia Panel: No results for input(s): VITAMINB12, FOLATE, FERRITIN, TIBC, IRON, RETICCTPCT in the last 72 hours. Sepsis Labs: No results for input(s): PROCALCITON, LATICACIDVEN in the last 168 hours.  Recent Results (from the past 240 hour(s))  Culture, Urine     Status: None   Collection Time: 04/26/20  1:51 PM   Specimen: Urine, Clean Catch  Result Value Ref Range Status   Specimen Description   Final    URINE, CLEAN CATCH Performed at [facility], [place] [address] [place], [place] [address]    Special Requests   Final    NONE Performed at [facility], [place] [address], [place], [place] [address]    Culture   Final    NO GROWTH Performed at [facility], [place] [address], [place], [place]  [address]    Report Status 04/27/2020 FINAL  Final  MRSA PCR Screening     Status: None   Collection Time: 05/01/20  7:58 AM   Specimen: Nasal Mucosa; Nasopharyngeal  Result Value Ref Range Status   MRSA by PCR NEGATIVE NEGATIVE Final    Comment:        The GeneXpert MRSA Assay (FDA approved for NASAL specimens only), is one component of a comprehensive MRSA colonization surveillance program. It is not intended to diagnose MRSA infection nor to guide or monitor treatment for MRSA infections. Performed at [facility], [place] [address], [place], [place] [address]          Radiology Studies: No results found.      Scheduled Meds: . apixaban  2.5 mg Per Tube BID  . Chlorhexidine Gluconate Cloth  6 each Topical Daily  . diclofenac Sodium  2 g Topical QID  . diltiazem  30 mg Per Tube Q6H  . feeding supplement (PROSource TF)  45 mL Per Tube BID  . ferrous  sulfate  325 mg Per Tube Q breakfast  . free water  200 mL Per Tube Q8H  . Gerhardt's butt cream   Topical BID  . hydrALAZINE  50 mg Per Tube Q8H  . insulin aspart  0-9 Units Subcutaneous Q4H  . insulin detemir  17 Units Subcutaneous Daily  . levothyroxine  88 mcg Per Tube Q0600  . metoprolol tartrate  50 mg Per Tube BID  . nutrition supplement (JUVEN)  1 packet Per Tube BID BM  . pantoprazole sodium  40 mg Per Tube QHS  . sodium bicarbonate  1,300 mg Per Tube TID  . sodium chloride flush  10-40 mL Intracatheter Q12H  . sodium chloride flush  3 mL Intravenous Q12H   Continuous Infusions: . sodium chloride 75 mL/hr at 05/03/20 0400  . sodium chloride 250 mL (04/29/20 2058)  . DAPTOmycin (CUBICIN)  IV 220 mL/hr at 05/02/20 1700  . feeding supplement (OSMOLITE 1.5 CAL) 50 mL/hr at 05/03/20 0400  . levETIRAcetam 500 mg (05/02/20 2142)  . piperacillin-tazobactam (ZOSYN)  IV 3.375 g (05/02/20 2200)  . 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)   Chronic diastolic heart failure (HCC)   Diarrhea   Renal insufficiency   Hypertension associated with diabetes (HCC)   Paroxysmal atrial fibrillation (HCC)   Hypothyroidism   Symptomatic anemia   Abdominal pain   CHF (congestive heart failure) ([place])   Acute metabolic encephalopathy   Other cirrhosis of liver ([place])   Palliative care by specialist   Goals of care, counseling/discussion   Discitis   Status epilepticus ([place])   Malnutrition of moderate degree   Nonconvulsive status epilepticus/Acute encephalopathy Patient presenting from [facility] with altered mental status.  CT head with no acute intracranial findings.  MR brain with no acute findings.  Urinalysis unrevealing.  Ammonia level 21.  Continuous EEG showed frontally predominant sharp wave discharges. Neurology was concerned about need for further escalation of care and requesting ICU transfer, PCCM consulted. Neurology following-Routine EEG was concerning for nonconvulsive status epilepticus which has since improved after adding Keppra and Depakote. -LTM EEG now showingSIRPIDSwhich has not significantly changed after adjusting AEDs and therefore is most likely interictal. SIRPIDS most likely 2/2 to cephalosporin toxicity and infection in setting of renal dynsfunction 12/4-with slow improvement, will be a while for her to improve fully Seizure has improved after adding Keppra and Depakote If patient has any further clinical seizures, will add Vimpat per neurology Patient has mild tremors rigidity suggestive of parkinsonian is likely due to infection, drug-induced versus Parkinson's disease per neurology Seizure precautions Continue tube feeding as patient has decreased p.o. intake Ativan 2 mg as needed for clinical seizures lasting more than 51min   Abdominal distention- today appears distended. bs mildly hypoactive On TF. With diarrhea.  Will obtain abd xr for further  evaluation.  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  Underwent IR lumbar disc aspiration on 04/22/2020 with 2 mL turbid serosanguineous fluid obtained.  ID following Antibiotics or change from cefepime to Zosyn due to #1. Continue daptomycin and Zosyn. Tentative plan for 6 weeks total IV antibiotics, until 06/05/2019 ID placed OPAT consult and arranged f/u with Dr. Tommy Medal on 06/02/20 at 11:30am. ID signed off   AKI on CKD stage IV Baseline creatinine 1.5-1.8. --Cr 2.30>>1.41>>4.82>>2.08>2.26>>>2.36>>2.54>>>3.19 Acutely patient  is likely prerenal given poor p.o. intake and having diarrhea. Nephrology was consulted due to worsening of renal failure, input was appreciated.  They also agree with prerenal azotemia. Nephrology recommends continuing IV fluid, bladder scan and perform I&O cath if PVR more than 300 12/4-renal function worsening. Having urinary retention requiring I/o cath. She is not a candidate for dialysis and will continue with conservative care Renally dose medications Avoid nephrotoxic agents such as NSAIDs, Cox 2, Fleet enema, IV contrast Continue IVF Monitor renal function   Diarrhea- Likely from TF and lactulose Lactulose stopped 12/3 Decreasing. Monitor ammonia level prn    Symptomatic anemia Hemoglobin 3.5 on admission. FOBT positive. --Hgb 3.5>3.1>10.0>>>8.2 Appears to be stabilizing Ferrous sulfate daily PPI daily Monitor as patient is on Eliquis    Providencia stuartii and VRE UTI Completed course of antibiotics  Repeat urine culture NTD   Cirrhosis, new diagnosis Incidental finding of cirrhosis on CT abdomen/pelvis.  MR abdomen degraded exam however no concerning lesions.  Acute hepatitis panel with equivocal results for hepatitis A antibody.   -We will need GI follow-up as outpatient with repeat hepatitis panel    Type 2 diabetes  mellitus Hemoglobin A1c 5.6 on 03/30/2020, well controlled. Home regimen includes Levemir 20 units subcutaneously daily, Januvia 25 mg p.o. daily 12/4-glucose elevated.  May need to change TF content, will d/w pharmacy/dietician increse Lantus to 17 units Continue RISS     Chronic diastolic congestive heart failure, compensated TTE 03/30/2020, LVEF 65-70%, no LV regional wall motion abnormalities, mild concentric LVH, grade 1 diastolic dysfunction, mild MR, moderate TR, moderate aortic valve sclerosis without evidence of aortic stenosis, IVC dilated.  Not in acute exacerbation Daily weight  Essential hypertension Paroxysmal atrial fibrillation --Diltiazem 120 mg p.o. daily --Metoprolol tartrate 25 mg BID --Apixaban 2.5 mg twice daily (was on Xarelto outpatient but changed due to renal function)  Hypothyroidism:  --Levothyroxine 88 mcg p.o. daily, may need to convert to IV form if AMS persists  Hx Gout: on allopurinol and colchisine      DVT prophylaxis: Eliquis Code Status: DNR Family Communication: spoke to daughter via phone  Status is: Inpatient  Remains inpatient appropriate because:IV treatments appropriate due to intensity of illness or inability to take PO   Dispo:  Patient From: Home  Planned Disposition: SNF  Expected discharge date:TBD   Medically Not medically stable for discharge: Still with encephalopathy, on TF. Creatinine going up.            LOS: 35 days   Time spent: 45 minutes with more than 50% on [place], MD [facility] Pager [phone]  If 7PM-7AM, please contact night-coverage [email or web address] Password [email or web address] 05/03/2020, 8:03 AM

## 2020-05-03 NOTE — Progress Notes (Signed)
Patient ID: CINCERE DEPREY, female   DOB: 11/28/1945, 74 y.o.   MRN: [id number] S: No events noted overnight O:BP (!) 132/55 (BP Location: Right Leg)   Pulse 82   Temp 98.4 F (36.9 C) (Oral)   Resp 20   Ht 5\' 1"  (1.549 m)   Wt 72.7 kg   SpO2 98%   BMI 30.28 kg/m   Intake/Output Summary (Last 24 hours) at 05/03/2020 1045 Last data filed at 05/03/2020 1015 Gross per 24 hour  Intake 1972.52 ml  Output 50 ml  Net 1922.52 ml   Intake/Output: I/O last 3 completed shifts: In: 3200.3 [I.V.:1858.9; NG/GT:900; IV Piggyback:441.4] Out: -   Intake/Output this shift:  Total I/O In: -  Out: 50 [Urine:50] Weight change: 0.9 kg Gen: lethargic and unresponsive this morning CVS: RRR Resp: scattered rhonchi Abd:+BS, soft Ext: trace pretibial edema  Recent Labs  Lab 04/27/20 0908 04/27/20 0908 04/28/20 0326 04/28/20 1317 04/29/20 0334 04/30/20 0353 05/01/20 0320 05/02/20 0450 05/03/20 0402  NA 144  --   --  145 141 147* 147* 146* 143  K 3.1*  --   --  3.1* 3.5 4.4 3.9 3.8 4.1  CL 100  --   --  105 106 108 106 107 103  CO2 27  --   --  28 27 26 24 24 25   GLUCOSE 127*  --   --  86 143* 241* 243* 314* 318*  BUN 27*  --   --  23 23 25* 27* 36* 45*  CREATININE 2.26*   < > 2.19* 2.26* 2.26* 2.36* 2.54* 3.19* 3.46*  CALCIUM 8.1*  --   --  8.2* 7.8* 8.5* 8.7* 8.5* 8.6*   < > = values in this interval not displayed.   Liver Function Tests: No results for input(s): AST, ALT, ALKPHOS, BILITOT, PROT, ALBUMIN in the last 168 hours. No results for input(s): LIPASE, AMYLASE in the last 168 hours. Recent Labs  Lab 04/29/20 1608 05/02/20 0450  AMMONIA 37* 33   CBC: Recent Labs  Lab 04/27/20 0908 04/28/20 0459 05/02/20 0450  WBC 15.2* 12.8* 13.8*  HGB 8.2* 8.1* 8.2*  HCT 26.4* 25.5* 25.4*  MCV 79.8* 78.9* 79.6*  PLT 292 278 264   Cardiac Enzymes: Recent Labs  Lab 04/29/20 0334  CKTOTAL 20*   CBG: Recent Labs  Lab 05/02/20 1611 05/02/20 1958 05/03/20 0006  05/03/20 0345 05/03/20 0758  GLUCAP 278* 311* 290* 305* 293*    Iron Studies: No results for input(s): IRON, TIBC, TRANSFERRIN, FERRITIN in the last 72 hours. Studies/Results: No results found. Marland Kitchen apixaban  2.5 mg Per Tube BID  . Chlorhexidine Gluconate Cloth  6 each Topical Daily  . diclofenac Sodium  2 g Topical QID  . diltiazem  30 mg Per Tube Q6H  . feeding supplement (PROSource TF)  45 mL Per Tube BID  . ferrous sulfate  325 mg Per Tube Q breakfast  . free water  200 mL Per Tube Q8H  . Gerhardt's butt cream   Topical BID  . hydrALAZINE  50 mg Per Tube Q8H  . insulin aspart  0-9 Units Subcutaneous Q4H  . insulin detemir  17 Units Subcutaneous Daily  . levothyroxine  88 mcg Per Tube Q0600  . metoprolol tartrate  50 mg Per Tube BID  . nutrition supplement (JUVEN)  1 packet Per Tube BID BM  . pantoprazole sodium  40 mg Per Tube QHS  . sodium bicarbonate  1,300 mg Per Tube TID  .  sodium chloride flush  10-40 mL Intracatheter Q12H  . sodium chloride flush  3 mL Intravenous Q12H    BMET    Component Value Date/Time   NA 143 05/03/2020 0402   NA 142 05/16/2013 1346   K 4.1 05/03/2020 0402   K 3.9 05/16/2013 1346   CL 103 05/03/2020 0402   CO2 25 05/03/2020 0402   CO2 24 05/16/2013 1346   GLUCOSE 318 (H) 05/03/2020 0402   GLUCOSE 155 (H) 05/16/2013 1346   BUN 45 (H) 05/03/2020 0402   BUN 29.9 (H) 05/16/2013 1346   CREATININE 3.46 (H) 05/03/2020 0402   CREATININE 2.19 (H) 10/24/2019 1454   CREATININE 1.2 (H) 05/16/2013 1346   CALCIUM 8.6 (L) 05/03/2020 0402   CALCIUM 10.3 05/16/2013 1346   GFRNONAA 13 (L) 05/03/2020 0402   GFRNONAA 21 (L) 10/24/2019 1454   GFRAA 40 (L) 12/25/2019 1502   GFRAA 25 (L) 10/24/2019 1454   CBC    Component Value Date/Time   WBC 13.8 (H) 05/02/2020 0450   RBC 3.19 (L) 05/02/2020 0450   HGB 8.2 (L) 05/02/2020 0450   HGB 13.4 12/23/2014 0908   HCT 25.4 (L) 05/02/2020 0450   HCT 39.8 12/23/2014 0908   PLT 264 05/02/2020 0450   PLT 333  12/23/2014 0908   MCV 79.6 (L) 05/02/2020 0450   MCV 85.9 12/23/2014 0908   MCH 25.7 (L) 05/02/2020 0450   MCHC 32.3 05/02/2020 0450   RDW 30.7 (H) 05/02/2020 0450   RDW 14.6 (H) 12/23/2014 0908   LYMPHSABS 2.7 04/26/2020 0446   LYMPHSABS 1.4 12/23/2014 0908   MONOABS 1.0 04/26/2020 0446   MONOABS 0.6 12/23/2014 0908   EOSABS 1.0 (H) 04/26/2020 0446   EOSABS 0.4 12/23/2014 0908   BASOSABS 0.1 04/26/2020 0446   BASOSABS 0.1 12/23/2014 0908    Assessment/Plan:  1. Recurrent oliguric AKI/CKD stage IV- agree with Dr. Kurtis Bushman that this is likely prerenal given poor po intake.  No recent IV contrasted studies, nephrotoxic agents, or recent episode of hypotension.  Agree with IVF's and follow UOP and Scr.   1. Continue to follow bladder scans daily and perform I&O cath if PVR>300.   2. She is not a candidate for dialysis and will continue with conservative care (see progress note dated 04/17/20). 3. Renal dose meds 4. Avoid nephrotoxic agents such as NSAIDs/COX-II I's, Fleets enema, IV contrast. 5. If her renal function continues to decline would recommend transition to hospice 2. 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 3. 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 Hypernatremia- improved with hypotonic fluids 5. UTI- per primary and ID 6. HTN 7. Cirrhosis- incidental finding on imaging. 8. Chronic diastolic CHF- stable 9. P. afib- per priamry 10. Disposition- poor overall prognosis and Palliative care has been following.  Recommend transitioning to hospice as she is not eating or drinking and renal function is worsening  Donetta Potts, MD [employer] [phone]

## 2020-05-04 ENCOUNTER — Inpatient Hospital Stay (HOSPITAL_COMMUNITY): Payer: Medicare Other

## 2020-05-04 LAB — BASIC METABOLIC PANEL
Anion gap: 16 — ABNORMAL HIGH (ref 5–15)
BUN: 64 mg/dL — ABNORMAL HIGH (ref 8–23)
CO2: 23 mmol/L (ref 22–32)
Calcium: 8.8 mg/dL — ABNORMAL LOW (ref 8.9–10.3)
Chloride: 99 mmol/L (ref 98–111)
Creatinine, Ser: 3.58 mg/dL — ABNORMAL HIGH (ref 0.44–1.00)
GFR, Estimated: 13 mL/min — ABNORMAL LOW (ref >60)
Glucose, Bld: 386 mg/dL — ABNORMAL HIGH (ref 70–99)
Potassium: 4.3 mmol/L (ref 3.5–5.1)
Sodium: 138 mmol/L (ref 135–145)

## 2020-05-04 LAB — GLUCOSE, CAPILLARY
Glucose-Capillary: 305 mg/dL — ABNORMAL HIGH (ref 70–99)
Glucose-Capillary: 318 mg/dL — ABNORMAL HIGH (ref 70–99)
Glucose-Capillary: 330 mg/dL — ABNORMAL HIGH (ref 70–99)
Glucose-Capillary: 344 mg/dL — ABNORMAL HIGH (ref 70–99)
Glucose-Capillary: 348 mg/dL — ABNORMAL HIGH (ref 70–99)
Glucose-Capillary: 352 mg/dL — ABNORMAL HIGH (ref 70–99)

## 2020-05-04 MED ORDER — LORAZEPAM 2 MG/ML IJ SOLN: 0.5000 mg | Freq: Once | INTRAMUSCULAR | Status: DC

## 2020-05-04 MED ORDER — INSULIN DETEMIR 100 UNIT/ML ~~LOC~~ SOLN
20.0000 [IU] | Freq: Every day | SUBCUTANEOUS | Status: DC
  Filled 2020-05-04: qty 0.2

## 2020-05-04 MED ORDER — FUROSEMIDE 10 MG/ML IJ SOLN
80.0000 mg | Freq: Once | INTRAMUSCULAR | Status: AC
  Administered 2020-05-04: 80 mg via INTRAVENOUS
  Filled 2020-05-04: qty 8

## 2020-05-05 NOTE — Progress Notes (Signed)
Patient's oxygen saturation deteriorated over couple of hours since the beginning of shift. Patient on 15 L NRB. Patient's HR began to decline too. Charged nurse informed the patient's daughter about the declining status, daughter on her way. MD notified twice via secure chat and Amion. Patient's time of death was August 21, 2201, verified with [facility]. This RN and charge nurse at bedside during the time of death. MD made aware. Called [facility] and made them aware too @2230 .   Daughter at bedside after the event. Patient's valuables sent with daughter. Yellow page found in the patient's file. Charge nurse called the daughter and informed her about the earrings that is with the security. Told her to collect the yellow form that is at [facility] desk and take it to the security desk.   Post mortem care done and body was taken down to the morgue. Patient placement informed about it.

## 2020-05-31 NOTE — Death Summary Note (Signed)
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)   Chronic diastolic heart failure (HCC)   Diarrhea   Renal insufficiency   Hypertension associated with diabetes (HCC)   Paroxysmal atrial fibrillation (HCC)   Hypothyroidism   Symptomatic anemia   Abdominal pain   CHF (congestive heart failure) ([place])   Acute metabolic encephalopathy   Other cirrhosis of liver ([place])   Palliative care by specialist   Goals of care, counseling/discussion   Discitis   Status epilepticus ([place])   Malnutrition of moderate degree   Brief Hospital Course (including significant findings, care, treatment, and services provided and events leading to death)   Nonconvulsive status epilepticus/Acute encephalopathy CT head with no acute intracranial findings. MR brain with no acute findings. Urinalysis unrevealing. Ammonia level 21. Continuous EEG showed frontally predominant sharp wave discharges. Neurology was concerned about need for further escalation of care and requesting ICU transfer, PCCM consulted. Neurology following-Routine EEG was concerning for nonconvulsive status epilepticus which has since improved after adding Keppra and Depakote. -LTM EEG now showingSIRPIDSwhich has not significantly changed after adjusting AEDs and therefore is most likely interictal. 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  Spoke with daughter and transition was made to comfort care.    Pertinent Labs and Studies  Significant Diagnostic Studies EEG  Result Date: 04/27/2020 Greta Doom, MD     04/27/2020  3:18 AM Patient Name: Sarah Colon MRN: [id number] EEG Attending: Roland Rack Referring Physician/Provider: Roland Rack Date: 04/27/2020 Duration: 22 minutes Patient history: 75 year old female being evaluated for acute mental status change Level of alertness: Awake AEDs during EEG study: Technical aspects: This EEG study was done with scalp electrodes positioned according to the 10-20 International system of electrode placement. Electrical activity was acquired at a sampling rate of 500Hz  and reviewed with a high frequency filter of 70Hz  and a low frequency filter of 1Hz . EEG data were recorded continuously and digitally stored. BACKGROUND ACTIVITY: The background is dominated by frequent 2.5 to 3 Hz frontally predominant sharp wave discharges.  This sometimes gives way to a 2 to 3-second run of 5 to 6 Hz generalized theta followed by resumption of the 2 to 3 Hz sharp wave discharges.  There is some waxing and waning in amplitude and morphology, but no clear frequency evolution.  Following Ativan administration, this pattern resolves and the background is dominated by irregular slow activity, predominantly theta range. SLEEP RECORDINGS: No clear sleep structures were seen. ACTIVATION PROCEDURES: Hyperventilation and photic stimulation were not performed. 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 Roland Rack, MD  [facility] [phone] If 7pm- 7am, please page neurology on call as listed in [place].  DG Abd 1 View  Result Date: 05/03/2020 CLINICAL DATA:  Check feeding catheter placement, abdominal distension EXAM: ABDOMEN - 1 VIEW COMPARISON:  None. FINDINGS: Feeding catheter is noted in the distal stomach. No obstructive changes are seen. No free air is noted. Calcified uterine fibroid is noted. IMPRESSION: Feeding catheter in the distal stomach. No focal abnormality is noted. Electronically Signed   By: Inez Catalina M.D.   On: 05/03/2020 14:41   CT HEAD WO CONTRAST  Result Date: 05/29/2020 CLINICAL DATA:  Mental status change. EXAM: CT HEAD WITHOUT CONTRAST TECHNIQUE: Contiguous axial images were obtained from the base of the skull through the vertex without intravenous contrast. COMPARISON:  April 26, 2020 FINDINGS: Brain: No subdural, epidural, or subarachnoid hemorrhage. Cerebellum, brainstem, and basal cisterns are normal. Ventricles and sulci are unremarkable. No mass effect or midline shift. No acute cortical ischemia or infarct. Vascular: Calcified atherosclerosis in the intracranial carotids. Skull: Normal. Negative for fracture or focal lesion. Sinuses/Orbits: No acute finding. Other: None. IMPRESSION: No acute intracranial abnormalities are identified. Electronically Signed   By: Dorise Bullion III M.D   On: May 29, 2020 15:58   CT HEAD WO CONTRAST  Result Date: 04/26/2020 CLINICAL DATA:  Mental status change. EXAM: CT HEAD WITHOUT CONTRAST TECHNIQUE: Contiguous axial images were obtained from the base of the skull through the vertex without intravenous contrast. COMPARISON:  CT head August 25, 2015. FINDINGS: Brain: No evidence of acute large vascular territory infarction, hemorrhage, hydrocephalus, extra-axial collection or mass lesion/mass effect. Mild white matter hypoattenuation, likely related to chronic microvascular ischemic disease. Vascular: Calcific atherosclerosis. Skull: No  acute fracture.  Hyperostosis frontalis. Sinuses/Orbits: No acute finding.  Visualized sinuses are clear. Other: No mastoid effusions. IMPRESSION: No evidence of acute intracranial abnormality. Electronically Signed   By: Margaretha Sheffield MD   On: 04/26/2020 07:42   MR BRAIN WO CONTRAST  Result Date: 04/26/2020 CLINICAL DATA:  Delirium. EXAM: MRI HEAD WITHOUT CONTRAST TECHNIQUE: Multiplanar, multiecho pulse sequences of the brain and surrounding structures were obtained without intravenous contrast. COMPARISON:  Same day CT head. FINDINGS: Brain: No acute infarction, hemorrhage, hydrocephalus, extra-axial collection or mass lesion. Mild patchy T2/FLAIR hyperintensities within the white matter, nonspecific but most likely secondary to chronic microvascular ischemic disease. Vascular: Major arterial flow voids are maintained at the skull base. Skull and upper cervical spine: Normal marrow signal. Sinuses/Orbits: Negative. Other: No mastoid effusions. IMPRESSION: No evidence of acute intracranial abnormality. Electronically Signed   By: Margaretha Sheffield MD   On: 04/26/2020 13:11   MR SACRUM SI JOINTS WO CONTRAST  Result Date: 04/19/2020 CLINICAL DATA:  Back pain.  Abnormal CT EXAM: MRI SACRUM WITHOUT CONTRAST TECHNIQUE: Multiplanar, multisequence MR imaging of the sacrum was performed. No intravenous contrast was administered. COMPARISON:  CT 04/04/2020 FINDINGS: Transitional lumbosacral anatomy with lumbarization of the S1 segment and a partially developed disc space at the S1-2 level. There is fluid signal within the L4-5 intervertebral disc with endplate irregularity involving the L4 and L5 vertebral bodies. There is bone marrow edema throughout the inferior aspect of the visualized L4 vertebral body and to a lesser extent within the superior aspect of the L5 vertebral body. There is anterior height loss of L5. Small amount of fluid or edema in the anterior epidural space at L5 (series 7, image 15).  There is prevertebral soft tissue edema as well as edematous changes within the bilateral iliopsoas musculature, right greater than left. No organized psoas fluid collection. Small bilateral facet joint effusions at L4-5.  The sacrum is intact without fracture or marrow signal abnormality. No evidence of sacroiliitis or SI joint septic arthritis. There is nonspecific presacral edema. Circumferential rectal wall thickening and edema within the perirectal fat. Partially visualized ascites throughout the abdomen and pelvis, increased from prior CT. Small bilateral hip joint effusions, nonspecific. There is nonspecific intramuscular edema within the paraspinal and visualized bilateral gluteal musculature. IMPRESSION: 1. 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 Small amount of fluid or edema in the anterior epidural space at L5 may be reactive or potentially represent developing phlegmon. If further imaging evaluation is clinically indicated, a contrast-enhanced MRI of the lumbar spine could be performed. 2. Prevertebral soft tissue edema as well as edematous changes within the bilateral iliopsoas musculature, right greater than left. No organized psoas fluid collection. 3. Partially visualized ascites throughout the abdomen and pelvis, increased from prior CT. 4. Circumferential rectal wall thickening and edema within the perirectal fat compatible with proctitis. 5. Small bilateral hip joint effusions, nonspecific. 6. Diffuse intramuscular edema within the paraspinal and visualized bilateral gluteal musculature. These results will be called to the ordering clinician or representative by the Radiologist Assistant, and communication documented in the PACS or [employer]. Electronically Signed   By: Davina Poke D.O.   On: 04/19/2020 18:48   IR  Fluoro Guide CV Line Right  Result Date: 04/25/2020 INDICATION: 75 year old female with osteomyelitis discitis EXAM: IR ULTRASOUND GUIDANCE VASC ACCESS RIGHT; IR RIGHT FLUORO GUIDE CV LINE MEDICATIONS: None. ANESTHESIA/SEDATION: None. FLUOROSCOPY TIME:  Fluoroscopy Time: 0 minutes 30 seconds (3 mGy). COMPLICATIONS: None immediate. PROCEDURE: Informed written consent was obtained from the patient after a thorough discussion of the procedural risks, benefits and alternatives. All questions were addressed. Maximal Sterile Barrier Technique was utilized including caps, mask, sterile gowns, sterile gloves, sterile drape, hand hygiene and skin antiseptic. A timeout was performed prior to the initiation of the procedure. The right internal jugular vein was interrogated with ultrasound and found to be widely patent. An image was obtained and stored for the medical record. Local anesthesia was attained by infiltration with 1% lidocaine. A small dermatotomy was made. Under real-time sonographic guidance, the vessel was punctured with a 21 gauge micropuncture needle. Using standard technique, the initial micro needle was exchanged over a 0.018 micro wire for a transitional 4 [place] micro sheath. A suitable skin entry site was then selected on the anterior chest. Local anesthesia was again attained by infiltration with 1% lidocaine. A small dermatotomy was made. A 6 [place] dual lumen power injectable central venous catheter was then tunneled from the skin exit site to the dermatotomy overlying the venous access site. Catheter was cut to 21 cm in length. The 4 French micro sheath was then exchanged for a peel-away sheath which was advanced into the superior vena cava. The catheter was then advanced through the peel-away sheath and the peel-away sheath was discarded. The position of the catheter tip was confirmed to be in the upper right atrium on fluoroscopy. An image was obtained and stored for the medical record. The  catheter flushes and aspirates well. The catheter was flushed with heparinized saline and capped. The catheter was then secured to the skin with 0 Prolene suture. IMPRESSION: Successful placement of a 6 [place] dual lumen power injectable tunneled central venous catheter via the right internal jugular vein. Catheter tips are  in the upper right atrium and the catheter is ready for immediate use. Electronically Signed   By: Jacqulynn Cadet M.D.   On: 04/25/2020 16:03   IR US Guide Vasc Access Right  Result Date: 04/25/2020 INDICATION: 75 year old female with osteomyelitis discitis EXAM: IR ULTRASOUND GUIDANCE VASC ACCESS RIGHT; IR RIGHT FLUORO GUIDE CV LINE MEDICATIONS: None. ANESTHESIA/SEDATION: None. FLUOROSCOPY TIME:  Fluoroscopy Time: 0 minutes 30 seconds (3 mGy). COMPLICATIONS: None immediate. PROCEDURE: Informed written consent was obtained from the patient after a thorough discussion of the procedural risks, benefits and alternatives. All questions were addressed. Maximal Sterile Barrier Technique was utilized including caps, mask, sterile gowns, sterile gloves, sterile drape, hand hygiene and skin antiseptic. A timeout was performed prior to the initiation of the procedure. The right internal jugular vein was interrogated with ultrasound and found to be widely patent. An image was obtained and stored for the medical record. Local anesthesia was attained by infiltration with 1% lidocaine. A small dermatotomy was made. Under real-time sonographic guidance, the vessel was punctured with a 21 gauge micropuncture needle. Using standard technique, the initial micro needle was exchanged over a 0.018 micro wire for a transitional 4 [place] micro sheath. A suitable skin entry site was then selected on the anterior chest. Local anesthesia was again attained by infiltration with 1% lidocaine. A small dermatotomy was made. A 6 [place] dual lumen power injectable central venous catheter was then tunneled from the skin  exit site to the dermatotomy overlying the venous access site. Catheter was cut to 21 cm in length. The 4 French micro sheath was then exchanged for a peel-away sheath which was advanced into the superior vena cava. The catheter was then advanced through the peel-away sheath and the peel-away sheath was discarded. The position of the catheter tip was confirmed to be in the upper right atrium on fluoroscopy. An image was obtained and stored for the medical record. The catheter flushes and aspirates well. The catheter was flushed with heparinized saline and capped. The catheter was then secured to the skin with 0 Prolene suture. IMPRESSION: Successful placement of a 6 [place] dual lumen power injectable tunneled central venous catheter via the right internal jugular vein. Catheter tips are in the upper right atrium and the catheter is ready for immediate use. Electronically Signed   By: Jacqulynn Cadet M.D.   On: 04/25/2020 16:03   DG CHEST PORT 1 VIEW  Result Date: 18-May-2020 CLINICAL DATA:  Hypoxia EXAM: PORTABLE CHEST 1 VIEW COMPARISON:  04/26/2020 FINDINGS: Cardiac shadow is prominent. Feeding catheter is noted extending into the stomach tunneled PICC line is noted on the right stable in appearance. Aortic calcifications are noted. Inspiratory effort is poor. Increased density in the medial right lung base is noted new from the prior exam. IMPRESSION: Right medial lung base opacification consistent with acute infiltrate. Tubes and lines as described. Electronically Signed   By: Inez Catalina M.D.   On: 05/18/2020 14:00   DG CHEST PORT 1 VIEW  Result Date: 04/26/2020 CLINICAL DATA:  Altered mental status. EXAM: PORTABLE CHEST 1 VIEW COMPARISON:  April 03, 2020 FINDINGS: There is a right internal jugular venous catheter with its distal tip noted at the junction of the superior vena cava and right atrium. Predominant stable mild atelectasis and/or early infiltrate is seen within the lateral aspect and  retrocardiac region of the right lung base. There is no evidence of a pleural effusion or pneumothorax. The cardiac silhouette is mildly enlarged. Degenerative changes seen involving  the left shoulder. IMPRESSION: 1. Right internal jugular venous catheter positioning, as described above, without evidence of a pneumothorax. 2. Stable mild right basilar atelectasis and/or early infiltrate. Electronically Signed   By: Virgina Norfolk M.D.   On: 04/26/2020 15:09   Overnight EEG with video  Result Date: 04/27/2020 Lora Havens, MD     04/28/2020  9:59 AM Patient Name: Sarah Colon MRN: [id number] Epilepsy Attending: Lora Havens Referring Physician/Provider: Dr Roland Rack Duration: 04/27/2020 0231 to 04/28/2020 0231 Patient history: 75 yo F with encephalopathy in the setting of VRE UTI.  Stat eeg consistent with status epilepticus. EEG to evaluate for seizure. Level of alertness: Awake, asleep AEDs during EEG study: LEV, VPA Technical aspects: This EEG study was done with scalp electrodes positioned according to the 10-20 International system of electrode placement. Electrical activity was acquired at a sampling rate of 500Hz  and reviewed with a high frequency filter of 70Hz  and a low frequency filter of 1Hz . EEG data were recorded continuously and digitally stored. Description: No clear posterior dominant rhythm was seen. Sleep was characterized by vertex waves, sleep spindles (12 to 14 Hz), maximal frontocentral region.  EEG showed continuous generalized 3 to 6 Hz theta-delta slowing. Sharp waves and spikes were noted in bifrontal L>R region. Periodic discharges were triphasic morphology were also noted, at times rhythmic.  After around 0755 on 04/27/2020, eeg showed generalized periodic discharges with triphasic morphology at 1hz  which gradually appeared quasi rhythmic and had evolution in morphology and frequency. Per eeg tech, patient was noted to have episodes if right arm twitching  which were difficult to visualize on camera.  Patient's AEDs were adjusted. However when patient was stimulated, the periodic discharges became more frequent at 2 Hz and appeared rhythmic with some evolution and morphology as well concerning for stimulus induced rhythmic periodic ictal-interictal discharges ( SIRPIDS).  At times patient was noted to have subtle side-to-side head movements during these EEG changes. ABNORMALITY - Sharp waves, bifrontal, left more than right - Periodic Epileptiform discharges with triphasic morphology, generalized -Stimulation induced rhythmic periodic ictal hyper interictal discharges (SIRPIDS) -Continuous slow, generalized IMPRESSION: This study showed evidence of epileptogenicity arising from left more than right frontal region. There were also periodic epileptiform discharges with triphasic morphology which appeared rhythmic after stimulation and at frequency of 2 Hz which is on the ictal pattern interictal continuum with high potential for seizures.  However, no significant change in SIRPIDS was noted after titration of AEDs which suggests interictal nature. Additionally there was evidence of moderate to severe diffuse encephalopathy, nonspecific etiology. Priyanka O Yadav   VAS [place] LOWER EXTREMITY VENOUS (DVT)  Result Date: 04/18/2020  Lower Venous DVT Study Indications: Edema.  Comparison Study: no prior Performing Technologist: Abram Sander RVS  Examination Guidelines: A complete evaluation includes B-mode imaging, spectral Doppler, color Doppler, and power Doppler as needed of all accessible portions of each vessel. Bilateral testing is considered an integral part of a complete examination. Limited examinations for reoccurring indications may be performed as noted. The reflux portion of the exam is performed with the patient in reverse Trendelenburg.  +---------+---------------+---------+-----------+----------+--------------+ RIGHT     CompressibilityPhasicitySpontaneityPropertiesThrombus Aging +---------+---------------+---------+-----------+----------+--------------+ CFV      Full           Yes      Yes                                 +---------+---------------+---------+-----------+----------+--------------+  SFJ      Full                                                        +---------+---------------+---------+-----------+----------+--------------+ FV Prox  Full                                                        +---------+---------------+---------+-----------+----------+--------------+ FV Mid   Full                                                        +---------+---------------+---------+-----------+----------+--------------+ FV DistalFull                                                        +---------+---------------+---------+-----------+----------+--------------+ PFV      Full                                                        +---------+---------------+---------+-----------+----------+--------------+ POP      Full           Yes      Yes                                 +---------+---------------+---------+-----------+----------+--------------+ PTV      Full                                                        +---------+---------------+---------+-----------+----------+--------------+ PERO     Full                                                        +---------+---------------+---------+-----------+----------+--------------+   +---------+---------------+---------+-----------+----------+-------------------+ LEFT     CompressibilityPhasicitySpontaneityPropertiesThrombus Aging      +---------+---------------+---------+-----------+----------+-------------------+ CFV      Full           Yes      Yes                                      +---------+---------------+---------+-----------+----------+-------------------+ SFJ      Full                                                              +---------+---------------+---------+-----------+----------+-------------------+  FV Prox  Full                                                             +---------+---------------+---------+-----------+----------+-------------------+ FV Mid   Full                                                             +---------+---------------+---------+-----------+----------+-------------------+ FV DistalFull                                                             +---------+---------------+---------+-----------+----------+-------------------+ PFV      Full                                                             +---------+---------------+---------+-----------+----------+-------------------+ POP      Full           Yes      Yes                                      +---------+---------------+---------+-----------+----------+-------------------+ PTV      Full                                                             +---------+---------------+---------+-----------+----------+-------------------+ PERO                                                  Not well visualized +---------+---------------+---------+-----------+----------+-------------------+     Summary: BILATERAL: - No evidence of deep vein thrombosis seen in the lower extremities, bilaterally. - No evidence of superficial venous thrombosis in the lower extremities, bilaterally. -No evidence of popliteal cyst, bilaterally.   *See table(s) above for measurements and observations. Electronically signed by Deitra Mayo MD on 04/18/2020 at 3:53:43 PM.    Final    VAS [place] UPPER EXTREMITY VENOUS DUPLEX  Result Date: 04/16/2020 UPPER VENOUS STUDY  Indications: Right upper extremity edema, s/p right midline removal after infiltration Limitations: Poor ultrasound/tissue interface. Comparison Study: No prior study Performing Technologist: Maudry Mayhew MHA,  RDMS, RVT, RDCS  Examination Guidelines: A complete evaluation includes B-mode imaging, spectral Doppler, color Doppler, and power Doppler as needed of all accessible portions of each vessel. Bilateral testing is considered an integral part of a complete examination. Limited examinations for reoccurring indications may be performed as noted.  Right Findings: +----------+------------+---------+-----------+----------+-------+ RIGHT     CompressiblePhasicitySpontaneousPropertiesSummary +----------+------------+---------+-----------+----------+-------+ IJV           Full       Yes       Yes                      +----------+------------+---------+-----------+----------+-------+ Subclavian    Full       Yes       Yes                      +----------+------------+---------+-----------+----------+-------+ Axillary      Full       Yes       Yes                      +----------+------------+---------+-----------+----------+-------+ Brachial      Full       Yes       Yes                      +----------+------------+---------+-----------+----------+-------+ Radial        Full                                          +----------+------------+---------+-----------+----------+-------+ Ulnar         Full                                          +----------+------------+---------+-----------+----------+-------+ Cephalic      Full                                          +----------+------------+---------+-----------+----------+-------+ Basilic       Full                                          +----------+------------+---------+-----------+----------+-------+  Left Findings: +----------+------------+---------+-----------+----------+-------+ LEFT      CompressiblePhasicitySpontaneousPropertiesSummary +----------+------------+---------+-----------+----------+-------+ Subclavian               Yes       Yes                       +----------+------------+---------+-----------+----------+-------+  Summary:  Right: No evidence of deep vein thrombosis in the upper extremity. No evidence of superficial vein thrombosis in the upper extremity.  Left: No evidence of thrombosis in the subclavian.  *See table(s) above for measurements and observations.  Diagnosing physician: Harold Barban MD Electronically signed by Harold Barban MD on 04/16/2020 at 8:16:47 PM.    Final    [place] EKG SITE RITE  Result Date: 04/13/2020 If Site Rite image not attached, placement could not be confirmed due to current cardiac rhythm.  [place] EKG SITE RITE  Result Date: 04/12/2020 If Site Rite image not attached, placement could not be confirmed due to current cardiac rhythm.  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 She presents for disc aspiration. EXAM: Disc aspiration under fluoroscopy MEDICATIONS: The patient is currently admitted to the hospital and receiving intravenous  antibiotics. The antibiotics were administered within an appropriate time frame prior to the initiation of the procedure. ANESTHESIA/SEDATION: Fentanyl 50 mcg IV; Versed 1 mg IV Moderate Sedation Time:  10 The patient was continuously monitored during the procedure by the interventional radiology nurse under my direct supervision. COMPLICATIONS: None immediate. PROCEDURE: Informed written consent was obtained from the patient after a thorough discussion of the procedural risks, benefits and alternatives. All questions were addressed. Maximal Sterile Barrier Technique was utilized including caps, mask, sterile gowns, sterile gloves, sterile drape, hand hygiene and skin antiseptic. A timeout was performed prior to the initiation of the procedure. The L4-L5 disc space was identified fluoroscopically. A suitable skin entry site was selected and marked. The overlying skin was sterilely prepped and  draped in the standard fashion using chlorhexidine skin prep. Local anesthesia was attained by infiltration with 1% lidocaine. A small dermatotomy was made. Under fluoroscopic guidance, an 18 gauge trocar needle was carefully advanced into the disc space. Aspiration was performed yielding several mL of turbid serosanguineous fluid. This was reserved and sent for Gram stain and culture. The needle was removed.  The patient tolerated the procedure well. IMPRESSION: Successful L4-L5 disc aspiration yielding 2 mL turbid serosanguineous fluid. Electronically Signed   By: Jacqulynn Cadet M.D.   On: 04/23/2020 08:38   DG ESOPHAGUS W SINGLE CM (SOL OR THIN BA)  Result Date: 04/18/2020 CLINICAL DATA:  Evaluate for esophageal motility post bedside swallow study by speech pathology. EXAM: ESOPHOGRAM/BARIUM SWALLOW TECHNIQUE: Single contrast examination was performed using  thin barium. FLUOROSCOPY TIME:  Fluoroscopy Time: 1 minutes and 24 seconds of low-dose pulsed fluoroscopy Radiation Exposure Index (if provided by the fluoroscopic device): 8.7 mGy Number of Acquired Spot Images: 0 COMPARISON:  Chest radiographs 04/03/2020.  Chest CT 12/25/2019. FINDINGS: The study was performed in the supine and semi erect positions. Study is extremely limited by positioning and the patient's inability to swallow significant quantities of barium. Only mild esophageal dysmotility is suggested. No mucosal ulceration or high-grade stricture is identified. There was no aspiration of barium. The patient was able to swallow a barium tablet, and this passed into the distal esophagus. The tablet did not pass through the gastroesophageal junction during approximately 4 minutes of intermittent observation despite drinking water and thin barium in the semi erect position. IMPRESSION: 1. Limited study due to limited mobility and inability to swallow large volumes of barium. 2. Barium tablet did not pass through the distal esophagus. Mild distal  esophageal stricture cannot be excluded. 3. No aspiration or significant dysmotility observed. Electronically Signed   By: Richardean Sale M.D.   On: 04/18/2020 16:25    Microbiology Recent Results (from the past 240 hour(s))  MRSA PCR Screening     Status: None   Collection Time: 05/01/20  7:58 AM   Specimen: Nasal Mucosa; Nasopharyngeal  Result Value Ref Range Status   MRSA by PCR NEGATIVE NEGATIVE Final    Comment:        The GeneXpert MRSA Assay (FDA approved for NASAL specimens only), is one component of a comprehensive MRSA colonization surveillance program. It is not intended to diagnose MRSA infection nor to guide or monitor treatment for MRSA infections. Performed at [facility], [place] [address] [place], [place] [address]     Lab Basic Metabolic Panel: Recent Labs  Lab 04/30/20 0353 05/01/20 0320 05/02/20 0450 05/03/20 0402 06-May-2020 0522  NA 147* 147* 146* 143 138  K 4.4 3.9 3.8 4.1 4.3  CL  108 106 107 103 99  CO2 26 24 24 25 23   GLUCOSE 241* 243* 314* 318* 386*  BUN 25* 27* 36* 45* 64*  CREATININE 2.36* 2.54* 3.19* 3.46* 3.58*  CALCIUM 8.5* 8.7* 8.5* 8.6* 8.8*  MG 2.1 2.1 2.3 2.5*  --    Liver Function Tests: No results for input(s): AST, ALT, ALKPHOS, BILITOT, PROT, ALBUMIN in the last 168 hours. No results for input(s): LIPASE, AMYLASE in the last 168 hours. Recent Labs  Lab 05/02/20 0450  AMMONIA 33   CBC: Recent Labs  Lab 05/02/20 0450  WBC 13.8*  HGB 8.2*  HCT 25.4*  MCV 79.6*  PLT 264   Cardiac Enzymes: No results for input(s): CKTOTAL, CKMB, CKMBINDEX, TROPONINI in the last 168 hours. Sepsis Labs: Recent Labs  Lab 05/02/20 0450  WBC 13.8*       Geradine Girt 05/06/2020, 4:54 PM

## 2020-05-31 NOTE — Progress Notes (Signed)
Patient edema increasing.  Ring from left right finger and yellow metal earrings removed and taken to security.  Yellow copy of security envelop in patient chart

## 2020-05-31 NOTE — Progress Notes (Addendum)
Progress Note    Sarah Colon  NTZ:[id number] DOB: 1946/03/06  DOA: 03/28/2020 PCP: Patient, No Pcp Per    Brief Narrative:     Medical records reviewed and are as summarized below:  CHANNIE BOSTICK is an 75 y.o. female with past medical history significant for type 2 diabetes mellitus, chronic diastolic congestive heart failure, essential hypertension, paroxysmal atrial fibrillation, CKD stage IV [facility] of breath, chest pain, and fatigue. Patient with poor appetite and was found to have a hemoglobin of 3 on admission.FOBT was positive in the ED and GI was consulted with possible admission to the hospitalist service. She underwent EGD and colonoscopy without evidence of bleeding;but multiple polypectomies were performed. During the hospital course, patient was treated for right lung base pneumonia and prevent C/Enterococcus faecalis UTI in which she completed a course of antibiotics. Additionally, patient had a complaint of abdominal pain with CT abdomen/pelvis on 04/04/2020 with presacral and rectal findings associated with cortical erosion, sclerosis of unclear etiology. MRI L-spine consistent with discitis/osteomyelitis. Patient underwent IR guided aspiration of L4-5 disc with cultures showing no growth. Infectious disease was consulted and recommended 6-week course of daptomycin and cefepime. Cefepime was changed to Zosyn due to concerns of AKI as well as possible contributing factor releasing to status epilepticus. Patient was transferred to [facility] for further and neurology evaluation and continuous EEG on 04/27/2020.  11/28:Patient continues to be nonresponsive to verbal command and somnolent. Received IV loading Keppra and valproate early this morning. Continues on continuous EEG. Neurology present at bedside;EEG continues to show seizure activity. Neurology giving 2 mg IV Ativan and 2 g IV  Keppra. Given her persistent status with likely inability to protect airway and need for escalation of care, neurology recommends transfer to ICU for higher level of care. PCCM consulted for evaluation.  12/1- PCCM pickup. Pt remains mostly nonverbal, but noxious stimuli she may moan. Neurology by bedside. Pt with intentional tremor of left leg.  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 MR brain with no acute findings. Urinalysis unrevealing. Ammonia level 21. Continuous EEG showed frontally predominant sharp wave discharges. Neurology was concerned about need for further escalation of care and requesting ICU transfer, PCCM consulted. Neurology following-Routine EEG was concerning for nonconvulsive status epilepticus which has since improved after adding Keppra and Depakote. -LTM EEG now showingSIRPIDSwhich has not significantly changed after adjusting AEDs and therefore is most likely interictal. SIRPIDS most likely 2/2 to cephalosporin toxicity and infection in setting of renal dynsfunction 12/4-with slow improvement, will be a while for her to improve fully - Keppra and Depakote: If patient has any further clinical seizures,  will add Vimpat per neurology Continue tube feeding as patient has decreased p.o.  intake Ativan 2 mg as needed for clinical seizures lasting more than 51min -on 12/5: nursing describes a change in her mental status-- discussed with Dr. 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 Underwent IR lumbar disc aspiration on 04/22/2020 with 2 mL turbid serosanguineous fluid obtained.  Antibiotics changed from cefepime to Zosyn Continue daptomycin and Zosyn. Tentative plan for 6 weeks total IV antibiotics, until 06/05/2019 ID placed OPAT consult and arranged f/u with Dr. Tommy Medal on 06/02/20 at 11:30am. ID signed off  AKI on CKD stage IV Baseline creatinine 1.5-1.8. Nephrology: not a candidate for dialysis and will continue with conservative care Renally dose medications Avoid nephrotoxic agents such as NSAIDs, Cox 2, Fleet enema, IV contrast -appears volume overloaded-- will do dose of IV lasix x 1 -foley placed 12/4  Symptomatic anemia Hemoglobin3.5 on admission.FOBTpositive. --Hgb 3.5>3.1>10.0>>>8.2 PPI daily Monitor as patient is on Eliquis  Providencia stuartii andVRE UTI Completed course of antibiotics   Cirrhosis, new diagnosis Incidental finding of cirrhosis on CT abdomen/pelvis. MR abdomen degraded exam however no concerning lesions. Acute hepatitis panel with equivocal results for hepatitis A antibody.  -We will need GI follow-up as outpatient with repeat hepatitis panel  -developing ascities  Type 2 diabetes mellitus Hemoglobin A1c 5.6 on 03/30/2020, well controlled. Home regimen includes Levemir 20 units subcutaneously daily, Januvia 25 mg p.o. daily 12/4-glucose elevated.  increse Lantus SSI  Acute on Chronic diastolic  congestive heart failure TTE 03/30/2020, LVEF 65-70%, no LV regional wall motion abnormalities, mild concentric LVH, grade 1 diastolic dysfunction, mild MR, moderate TR, moderate aortic valve sclerosis without evidence of aortic stenosis, IVC dilated.  Daily weight -IV lasix x 1  Essential hypertension Paroxysmal atrial fibrillation --Diltiazem  --Metoprolol tartrate  --Apixaban 2.5 mg twice daily(was on Xarelto outpatient but changed due to renal function)  Hypothyroidism:  --Levothyroxine 88 mcg per tube  obesity Body mass index is 30.28 kg/m.   Poor overall prognosis and patient is not making much progress-- discussed transition to comfort care and will need further discussions  Family Communication/Anticipated D/C date and plan/Code Status   DVT prophylaxis: eliquis Code Status: dnr Family Communication:called daughter Disposition Plan: Status is: Inpatient  Remains inpatient appropriate because:Inpatient level of care appropriate due to severity of illness   Dispo:  Patient From: Home  Planned Disposition: SNF  Expected discharge date: 05/07/20  Medically stable for discharge: No          Medical Consultants:    Palliative care (11/15)  Neurology  PCCM  ID  nephrology    Subjective:   Nursing reports that patient is not as alert as prior days  Objective:    Vitals:   05/03/20 1900 05/03/20 2329 27-May-2020 0300 05/27/20 0751  BP: (!) 145/67 (!) 142/63 (!) 141/68 135/63  Pulse: 72 69 70 67  Resp: 20 (!) 26 (!) 25 (!) 22  Temp: 98.1 F (36.7 C) 97.8 F (36.6 C) (!) 97.3 F (36.3 C)   TempSrc: Oral Oral Oral   SpO2: 98% 96% 98% 96%  Weight:      Height:        Intake/Output Summary (Last 24 hours) at 27-May-2020 1025 Last data filed at May 27, 2020 0700 Gross per 24 hour  Intake 1552.9 ml  Output 120 ml  Net 1432.9 ml  Filed Weights   05/01/20 0500 05/02/20 0532 05/03/20 0632  Weight: 68.6 kg 71.8 kg 72.7 kg     Exam:  General: Appearance:    Obese female min responsive     Lungs:     Appears to have increased work of breathing  Heart:    Normal heart rate. irr No murmurs, rubs, or gallops.   MS:   + edema  Neurologic:  not following commands    Data Reviewed:   I have personally reviewed following labs and imaging studies:  Labs: Labs show the following:   Basic Metabolic Panel: Recent Labs  Lab 04/29/20 0334 04/29/20 0334 04/30/20 0353 04/30/20 0353 05/01/20 0320 05/01/20 0320 05/02/20 0450 05/02/20 0450 05/03/20 0402 2020/05/12 0522  NA 141   < > 147*  --  147*  --  146*  --  143 138  K 3.5   < > 4.4   < > 3.9   < > 3.8   < > 4.1 4.3  CL 106   < > 108  --  106  --  107  --  103 99  CO2 27   < > 26  --  24  --  24  --  25 23  GLUCOSE 143*   < > 241*  --  243*  --  314*  --  318* 386*  BUN 23   < > 25*  --  27*  --  36*  --  45* 64*  CREATININE 2.26*   < > 2.36*  --  2.54*  --  3.19*  --  3.46* 3.58*  CALCIUM 7.8*   < > 8.5*  --  8.7*  --  8.5*  --  8.6* 8.8*  MG 2.0  --  2.1  --  2.1  --  2.3  --  2.5*  --    < > = values in this interval not displayed.   GFR Estimated Creatinine Clearance: 12.2 mL/min (A) (by C-G formula based on SCr of 3.58 mg/dL (H)). Liver Function Tests: No results for input(s): AST, ALT, ALKPHOS, BILITOT, PROT, ALBUMIN in the last 168 hours. No results for input(s): LIPASE, AMYLASE in the last 168 hours. Recent Labs  Lab 04/29/20 1608 05/02/20 0450  AMMONIA 37* 33   Coagulation profile No results for input(s): INR, PROTIME in the last 168 hours.  CBC: Recent Labs  Lab 04/28/20 0459 05/02/20 0450  WBC 12.8* 13.8*  HGB 8.1* 8.2*  HCT 25.5* 25.4*  MCV 78.9* 79.6*  PLT 278 264   Cardiac Enzymes: Recent Labs  Lab 04/29/20 0334  CKTOTAL 20*   BNP (last 3 results) No results for input(s): PROBNP in the last 8760 hours. CBG: Recent Labs  Lab 05/03/20 1658 05/03/20 2017 05/12/20 0011 May 12, 2020 0505 05/12/2020 0753  GLUCAP 304*  354* 330* 352* 344*   D-Dimer: No results for input(s): DDIMER in the last 72 hours. Hgb A1c: No results for input(s): HGBA1C in the last 72 hours. Lipid Profile: No results for input(s): CHOL, HDL, LDLCALC, TRIG, CHOLHDL, LDLDIRECT in the last 72 hours. Thyroid function studies: No results for input(s): TSH, T4TOTAL, T3FREE, THYROIDAB in the last 72 hours.  Invalid input(s): FREET3 Anemia work up: No results for input(s): VITAMINB12, FOLATE, FERRITIN, TIBC, IRON, RETICCTPCT in the last 72 hours. Sepsis Labs: Recent Labs  Lab 04/28/20 0459 05/02/20 0450  WBC 12.8* 13.8*    Microbiology Recent Results (from the past 240 hour(s))  Culture, Urine  Status: None   Collection Time: 04/26/20  1:51 PM   Specimen: Urine, Clean Catch  Result Value Ref Range Status   Specimen Description   Final    URINE, CLEAN CATCH Performed at [facility], [place] [address] [place], [place] [address]    Special Requests   Final    NONE Performed at [facility], [place] [address] [place], [place] [address]    Culture   Final    NO GROWTH Performed at [facility], [place] [address], [place], [place] [address]    Report Status 04/27/2020 FINAL  Final  MRSA PCR Screening     Status: None   Collection Time: 05/01/20  7:58 AM   Specimen: Nasal Mucosa; Nasopharyngeal  Result Value Ref Range Status   MRSA by PCR NEGATIVE NEGATIVE Final    Comment:        The GeneXpert MRSA Assay (FDA approved for NASAL specimens only), is one component of a comprehensive MRSA colonization surveillance program. It is not intended to diagnose MRSA infection nor to guide or monitor treatment for MRSA infections. Performed at [facility], [place] [address] [place],  [address]     Procedures and diagnostic studies:  DG Abd 1 View  Result Date: 05/03/2020 CLINICAL DATA:  Check feeding catheter placement, abdominal distension EXAM: ABDOMEN - 1 VIEW  COMPARISON:  None. FINDINGS: Feeding catheter is noted in the distal stomach. No obstructive changes are seen. No free air is noted. Calcified uterine fibroid is noted. IMPRESSION: Feeding catheter in the distal stomach. No focal abnormality is noted. Electronically Signed   By: Inez Catalina M.D.   On: 05/03/2020 14:41    Medications:   . apixaban  2.5 mg Per Tube BID  . Chlorhexidine Gluconate Cloth  6 each Topical Daily  . diclofenac Sodium  2 g Topical QID  . diltiazem  30 mg Per Tube Q6H  . feeding supplement (PROSource TF)  45 mL Per Tube BID  . ferrous sulfate  325 mg Per Tube Q breakfast  . free water  200 mL Per Tube Q8H  . furosemide  80 mg Intravenous Once  . Gerhardt's butt cream   Topical BID  . hydrALAZINE  50 mg Per Tube Q8H  . insulin aspart  0-9 Units Subcutaneous Q4H  . insulin detemir  17 Units Subcutaneous Daily  . levothyroxine  88 mcg Per Tube Q0600  . metoprolol tartrate  50 mg Per Tube BID  . nutrition supplement (JUVEN)  1 packet Per Tube BID BM  . pantoprazole sodium  40 mg Per Tube QHS  . sodium bicarbonate  1,300 mg Per Tube TID  . sodium chloride flush  10-40 mL Intracatheter Q12H  . sodium chloride flush  3 mL Intravenous Q12H   Continuous Infusions: . sodium chloride 75 mL/hr at 05/03/20 1723  . sodium chloride 250 mL (04/29/20 2058)  . DAPTOmycin (CUBICIN)  IV 500 mg (05/03/20 1839)  . feeding supplement (OSMOLITE 1.5 CAL) 50 mL/hr at 05/03/20 1600  . levETIRAcetam 500 mg (2020-05-28 0934)  . piperacillin-tazobactam (ZOSYN)  IV 3.375 g (05-28-20 0953)  . valproate sodium 1,000 mg (May 28, 2020 0923)     LOS: 36 days   Geradine Girt  [facility]   How to contact the [facility] Attending or Consulting provider [place] or covering provider during after hours [place], for this patient?  1. Check the care team in [facility] and look for a)  attending/consulting [facility] provider listed and b) the [facility] team listed 2. Log into [email or web address] and use [place]'s  universal password to access. If you do not have the password, please contact the hospital operator. 3. Locate the [facility] provider you are looking for under [facility] and page to a number that you can be directly reached. 4. If you still have difficulty reaching the provider, please page the [place] (Director on Call) for the Hospitalists listed on amion for assistance.  26-May-2020, 10:25 AM

## 2020-05-31 NOTE — Progress Notes (Signed)
Lasix was given @ 1152, but no response, urine out put only 40 ml and continue to have diarrhea. Patient's breathing is heavier and SpO2 was down to lower than 90. Patient is mouth breather changed to non rebreathing mask with 15 L. SpO2 higher than 95%. Dr. Eliseo Squires notified this condition changes and ordered neb treatment. Paging RT for breathing treatment and assess patient. SpO2 went up to 100% after neb treatment. Hold hydralazine & cardizem due to SBP goes down 100's-110's and HR down to 60-62, at times down to 58. Stopped tube feeding at 1641. No administered ativan due to SpO2 was problem. Continue to monitor patient's condition. HS [employer]

## 2020-05-31 NOTE — Progress Notes (Signed)
Patient ID: CARMIN ALVIDREZ, female   DOB: 1945/09/05, 75 y.o.   MRN: [id number] S: No events overnight.  Bladder scan with 500 ml but only retrieved 40 ml.  Possible error due to ascites.  No improvement from a cognitive standpoint remains nonverbal. O:BP 135/63 (BP Location: Right Leg)   Pulse 67   Temp (!) 97.3 F (36.3 C) (Oral)   Resp (!) 22   Ht 5\' 1"  (1.549 m)   Wt 72.7 kg   SpO2 96%   BMI 30.28 kg/m   Intake/Output Summary (Last 24 hours) at 05/11/2020 1016 Last data filed at 05/11/20 0700 Gross per 24 hour  Intake 1552.9 ml  Output 120 ml  Net 1432.9 ml   Intake/Output: I/O last 3 completed shifts: In: 3409.6 [I.V.:1569.7; NG/GT:1520; IV Piggyback:319.9] Out: 170 [Urine:170]  Intake/Output this shift:  No intake/output data recorded. Weight change:  Gen: chronically ill-appearing female, lethargic and uncooperative. CVS:RRR Resp: scattered rhonchi Abd: distended, +BS, moans with palpation Ext: 1+ anasarca to mid chest  Recent Labs  Lab 04/28/20 1317 04/29/20 0334 04/30/20 0353 05/01/20 0320 05/02/20 0450 05/03/20 0402 05/11/20 0522  NA 145 141 147* 147* 146* 143 138  K 3.1* 3.5 4.4 3.9 3.8 4.1 4.3  CL 105 106 108 106 107 103 99  CO2 28 27 26 24 24 25 23   GLUCOSE 86 143* 241* 243* 314* 318* 386*  BUN 23 23 25* 27* 36* 45* 64*  CREATININE 2.26* 2.26* 2.36* 2.54* 3.19* 3.46* 3.58*  CALCIUM 8.2* 7.8* 8.5* 8.7* 8.5* 8.6* 8.8*   Liver Function Tests: No results for input(s): AST, ALT, ALKPHOS, BILITOT, PROT, ALBUMIN in the last 168 hours. No results for input(s): LIPASE, AMYLASE in the last 168 hours. Recent Labs  Lab 04/29/20 1608 05/02/20 0450  AMMONIA 37* 33   CBC: Recent Labs  Lab 04/28/20 0459 05/02/20 0450  WBC 12.8* 13.8*  HGB 8.1* 8.2*  HCT 25.5* 25.4*  MCV 78.9* 79.6*  PLT 278 264   Cardiac Enzymes: Recent Labs  Lab 04/29/20 0334  CKTOTAL 20*   CBG: Recent Labs  Lab 05/03/20 1658 05/03/20 2017 May 11, 2020 0011 May 11, 2020 0505  05/11/20 0753  GLUCAP 304* 354* 330* 352* 344*    Iron Studies: No results for input(s): IRON, TIBC, TRANSFERRIN, FERRITIN in the last 72 hours. Studies/Results: DG Abd 1 View  Result Date: 05/03/2020 CLINICAL DATA:  Check feeding catheter placement, abdominal distension EXAM: ABDOMEN - 1 VIEW COMPARISON:  None. FINDINGS: Feeding catheter is noted in the distal stomach. No obstructive changes are seen. No free air is noted. Calcified uterine fibroid is noted. IMPRESSION: Feeding catheter in the distal stomach. No focal abnormality is noted. Electronically Signed   By: Inez Catalina M.D.   On: 05/03/2020 14:41   . apixaban  2.5 mg Per Tube BID  . Chlorhexidine Gluconate Cloth  6 each Topical Daily  . diclofenac Sodium  2 g Topical QID  . diltiazem  30 mg Per Tube Q6H  . feeding supplement (PROSource TF)  45 mL Per Tube BID  . ferrous sulfate  325 mg Per Tube Q breakfast  . free water  200 mL Per Tube Q8H  . Gerhardt's butt cream   Topical BID  . hydrALAZINE  50 mg Per Tube Q8H  . insulin aspart  0-9 Units Subcutaneous Q4H  . insulin detemir  17 Units Subcutaneous Daily  . levothyroxine  88 mcg Per Tube Q0600  . metoprolol tartrate  50 mg Per Tube BID  .  nutrition supplement (JUVEN)  1 packet Per Tube BID BM  . pantoprazole sodium  40 mg Per Tube QHS  . sodium bicarbonate  1,300 mg Per Tube TID  . sodium chloride flush  10-40 mL Intracatheter Q12H  . sodium chloride flush  3 mL Intravenous Q12H    BMET    Component Value Date/Time   NA 138 05-12-2020 0522   NA 142 05/16/2013 1346   K 4.3 05-12-20 0522   K 3.9 05/16/2013 1346   CL 99 2020/05/12 0522   CO2 23 12-May-2020 0522   CO2 24 05/16/2013 1346   GLUCOSE 386 (H) 05/12/20 0522   GLUCOSE 155 (H) 05/16/2013 1346   BUN 64 (H) 05/12/2020 0522   BUN 29.9 (H) 05/16/2013 1346   CREATININE 3.58 (H) 12-May-2020 0522   CREATININE 2.19 (H) 10/24/2019 1454   CREATININE 1.2 (H) 05/16/2013 1346   CALCIUM 8.8 (L) 05/12/2020 0522    CALCIUM 10.3 05/16/2013 1346   GFRNONAA 13 (L) 05/12/20 0522   GFRNONAA 21 (L) 10/24/2019 1454   GFRAA 40 (L) 12/25/2019 1502   GFRAA 25 (L) 10/24/2019 1454   CBC    Component Value Date/Time   WBC 13.8 (H) 05/02/2020 0450   RBC 3.19 (L) 05/02/2020 0450   HGB 8.2 (L) 05/02/2020 0450   HGB 13.4 12/23/2014 0908   HCT 25.4 (L) 05/02/2020 0450   HCT 39.8 12/23/2014 0908   PLT 264 05/02/2020 0450   PLT 333 12/23/2014 0908   MCV 79.6 (L) 05/02/2020 0450   MCV 85.9 12/23/2014 0908   MCH 25.7 (L) 05/02/2020 0450   MCHC 32.3 05/02/2020 0450   RDW 30.7 (H) 05/02/2020 0450   RDW 14.6 (H) 12/23/2014 0908   LYMPHSABS 2.7 04/26/2020 0446   LYMPHSABS 1.4 12/23/2014 0908   MONOABS 1.0 04/26/2020 0446   MONOABS 0.6 12/23/2014 0908   EOSABS 1.0 (H) 04/26/2020 0446   EOSABS 0.4 12/23/2014 0908   BASOSABS 0.1 04/26/2020 0446   BASOSABS 0.1 12/23/2014 0908     Assessment/Plan:  1. Recurrent oliguric AKI/CKD stage IV- agree with Dr. Kurtis Bushman that this is likely prerenal given poor po intake. No recent IV contrasted studies, nephrotoxic agents, or recent episode of hypotension. Agree with IVF's and follow UOP and Scr.  1. Continue to follow bladder scans daily and perform I&O cath if PVR>300.  2. She is not a candidate for dialysis and will continue with conservative care (see progress note dated 04/17/20). 3. Renal dose meds 4. Will check CK level to see if she is having ongoing seizures and muscle damage but no clear etiology of AKI/CKD  5. Will dose with IV lasix x 1 to see if she responds 6. Avoid nephrotoxic agents such as NSAIDs/COX-II I's, Fleets enema, IV contrast. 7. If her renal function continues to decline would recommend transition to hospice 2. 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 3. 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 Hypernatremia- improved with  hypotonic fluids 5. UTI- per primary and ID 6. HTN 7. Cirrhosis- incidental finding on imaging. 8. Chronic diastolic CHF- stable 9. P. afib- per priamry 10. Disposition- poor overall prognosis and Palliative care has been following.  Recommend transitioning to hospice as she is not responding or able to take po and renal function is worsening  Donetta Potts, MD [employer] [phone]

## 2020-05-31 NOTE — Progress Notes (Signed)
Called daughter with results of CT scan and x ray.  Also expressed my concern regarding her vital signs and mental status.  Discussed transition to comfort care would be appropriate at this time.  Daughter to try to come in after finding sitter for granddaughter.  Also will do trial of ativan x 1 to see if this helps patient's breathing.   Eulogio Bear DO

## 2020-05-31 NOTE — Progress Notes (Signed)
Patient was hypothermic 94.1 by rectal temp @ 1138. Patient's mental status was decreased compare from yesterday. No interaction with verbally, SpO2 down to 88% with RA, applied O2 3L via [place]. Dr. Eliseo Squires notified this matter and Dr. Eliseo Squires will talk to patient's daughter regarding patient's condition, possible hospice due to Cr. 3.46, doesn't make urine and no candidate for dialysis. Ordered Retail [employer] for hypothermia and continue to monitor patient's SpO2. Administered lasix 80mg  IVP. CXR was done. HS [employer]

## 2020-05-31 NOTE — Progress Notes (Signed)
HOSPITAL MEDICINE OVERNIGHT EVENT NOTE    Notified by nursing that patient's oxygen saturations rapidly began to deteriorate over the past hour or so.  Patient have been receiving a nonrebreather mask for supplemental ox as well as recent bronchodilator administration.  Despite intervention, patient unfortunately developed substantial bradycardia and cardiac arrest.  Nursing states time of death 09/18/2201, nursing has pronounced.  Patient' family has been notified.  Vernelle Emerald  MD [facility]

## 2020-05-31 NOTE — Progress Notes (Signed)
Pharmacy Antibiotic Note  Sarah Colon is a 75 y.o. female admitted on 03/22/2020 with L4-L5 discitis/osteomyelitis and is s/p disc aspiration on 11/23. Pharmacy has been consulted for daptomycin and Zosyn dosing.  The patient has worsening CKD with current Scr at 3.58 and CrCl ~12 mL/min. WBC continues to be elevated but improved at 13.8. Afebrile.   Plan: Continue daptomycin IV 500 mg q48h Monitor weekly CK on Monday  Continue Zosyn IV 3.375 g q12h (4-hour extended infusion) Monitor renal function and adjust dosing as needed Plan for 6 weeks of therapy per infectious disease (end 1/5)  Height: 5\' 1"  (154.9 cm) Weight: 72.7 kg (160 lb 4.4 oz) IBW/kg (Calculated) : 47.8  Temp (24hrs), Avg:97 F (36.1 C), Min:94.1 F (34.5 C), Max:98.1 F (36.7 C)  Recent Labs  Lab 04/28/20 0459 04/28/20 1317 04/30/20 0353 05/01/20 0320 05/02/20 0450 05/03/20 0402 May 09, 2020 0522  WBC 12.8*  --   --   --  13.8*  --   --   CREATININE  --    < > 2.36* 2.54* 3.19* 3.46* 3.58*   < > = values in this interval not displayed.    Estimated Creatinine Clearance: 12.2 mL/min (A) (by C-G formula based on SCr of 3.58 mg/dL (H)).    Allergies  Allergen Reactions  . Codeine Other (See Comments)    Swelling    Antimicrobials this admission: 10/31 Rifaximin >> 11/21 11/5 Unasyn >> 11/6 11/6 Meropenem >> 11/7 11/7 Zosyn >> 11/12; resumed 11/27>> (1/5) 11/24 Daptomycin >> (1/5) 11/24 Cefepime >> 11/27  Dose adjustments this admission: 11/29: Zosyn IV 3.375 g q8h >> Zosyn IV 3.375 g q12h (pts CrCl dropped <20 mL/min)  Microbiology results:  11/27 UCx NGTD 11/23 L4/L5 abscess aspiration: NGTD 11/19 Respiratory panel: NGTD 11/5 BCx: NGTD 11/5 UCx: 70K Providencia stuartii, >100K VRE, susceptibile to cefepime/Zosyn 11/1 MRSA PCR: negative 10/30 Respiratory panel: NGTD   Thank you for allowing pharmacy to be a part of this patient's care.  Benetta Spar, PharmD, BCPS, BCCP Clinical  Pharmacist  Please check AMION for all [place] phone numbers After 10:00 PM, call [place] [phone]

## 2020-05-31 DEATH — deceased

## 2020-06-02 ENCOUNTER — Inpatient Hospital Stay: Payer: Medicare Other | Admitting: Infectious Disease

## 2020-10-24 ENCOUNTER — Ambulatory Visit: Payer: Medicare Other | Admitting: Hematology

## 2020-10-24 ENCOUNTER — Other Ambulatory Visit: Payer: Medicare Other

## 2021-07-20 IMAGING — DX DG CHEST 1V PORT
1 series · 1 of 1 positions shown · non-contrast
Comparison: 05/28/2018

CLINICAL DATA: Tachycardia

EXAM:
PORTABLE CHEST 1 VIEW

[chest]
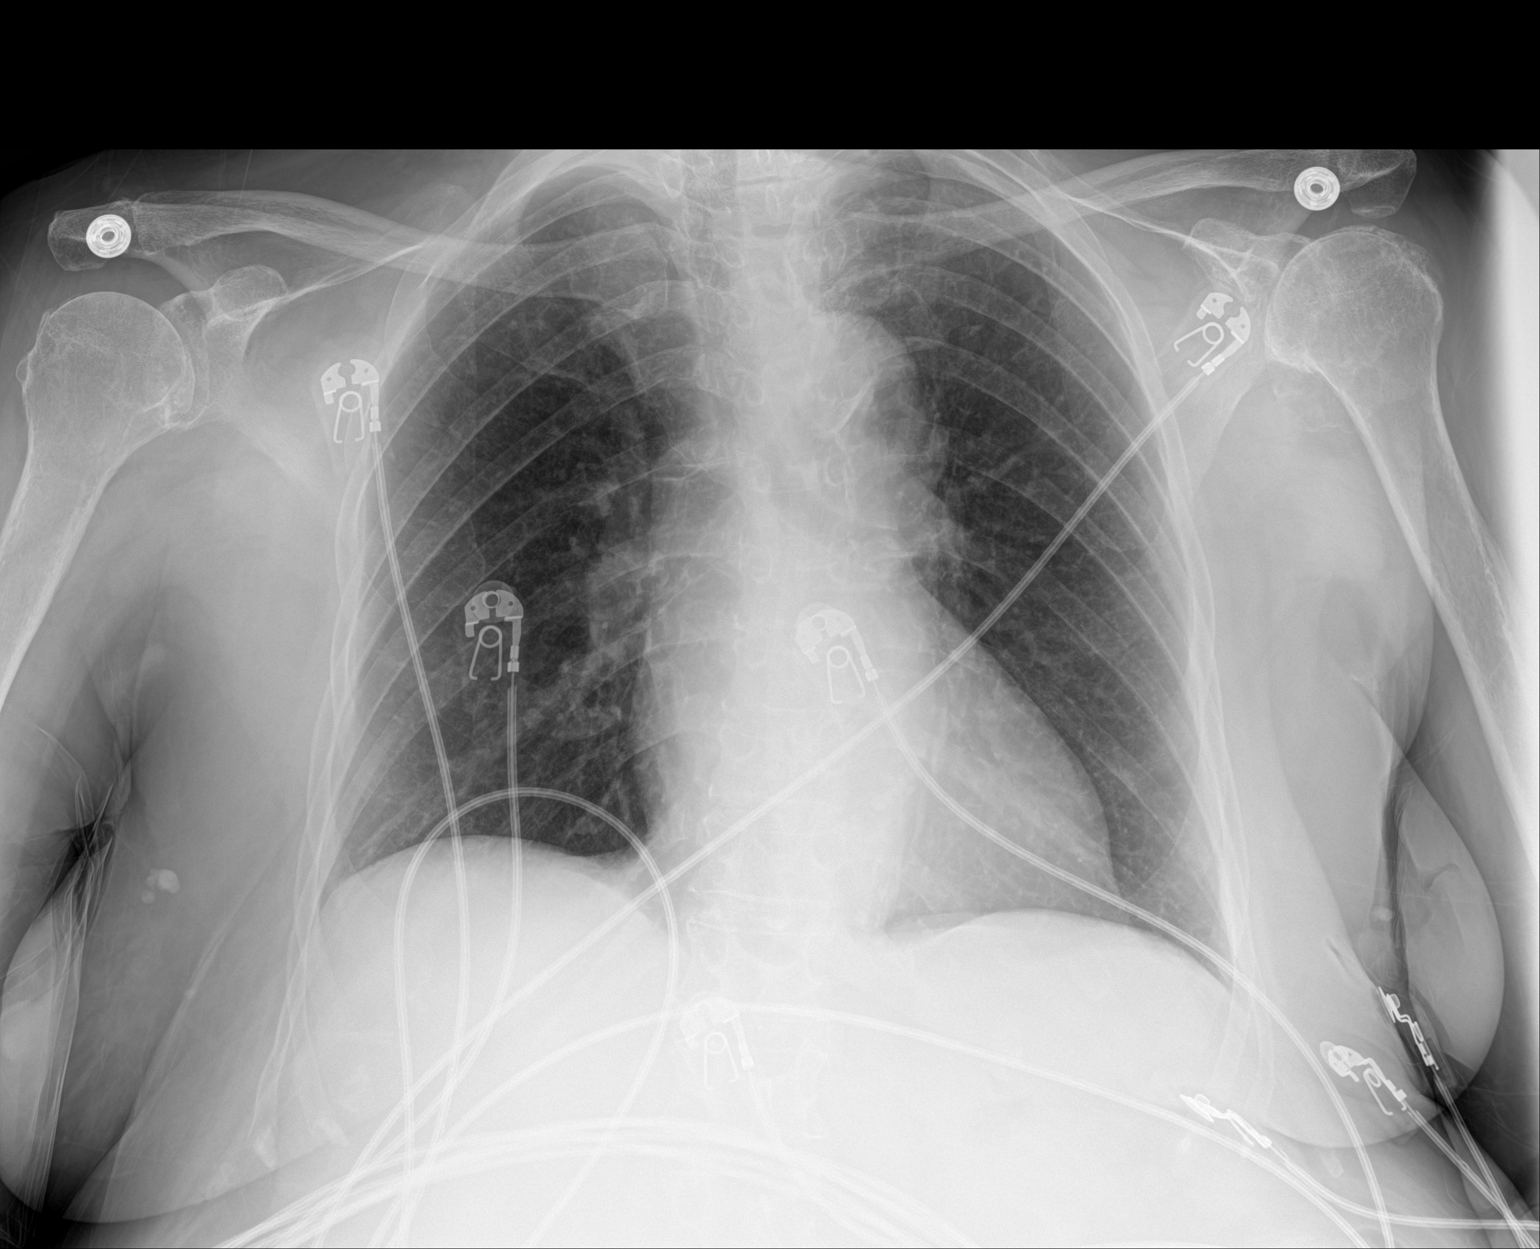

[1 of 1 positions shown; findings below may reference images not displayed]

FINDINGS: Normal sized heart. Tortuous and partially calcified thoracic aorta.
Clear lungs with normal vascularity. Diffuse osteopenia. Marked left
glenohumeral degenerative changes.
IMPRESSION: No acute abnormality.

## 2021-07-27 IMAGING — CR DG CHEST 2V
2 series · 2 of 2 positions shown · non-contrast
Comparison: Chest x-ray 12/16/2019.

CLINICAL DATA: 74-year-old female with history of weakness.

EXAM:
CHEST - 2 VIEW

[w chest lat]
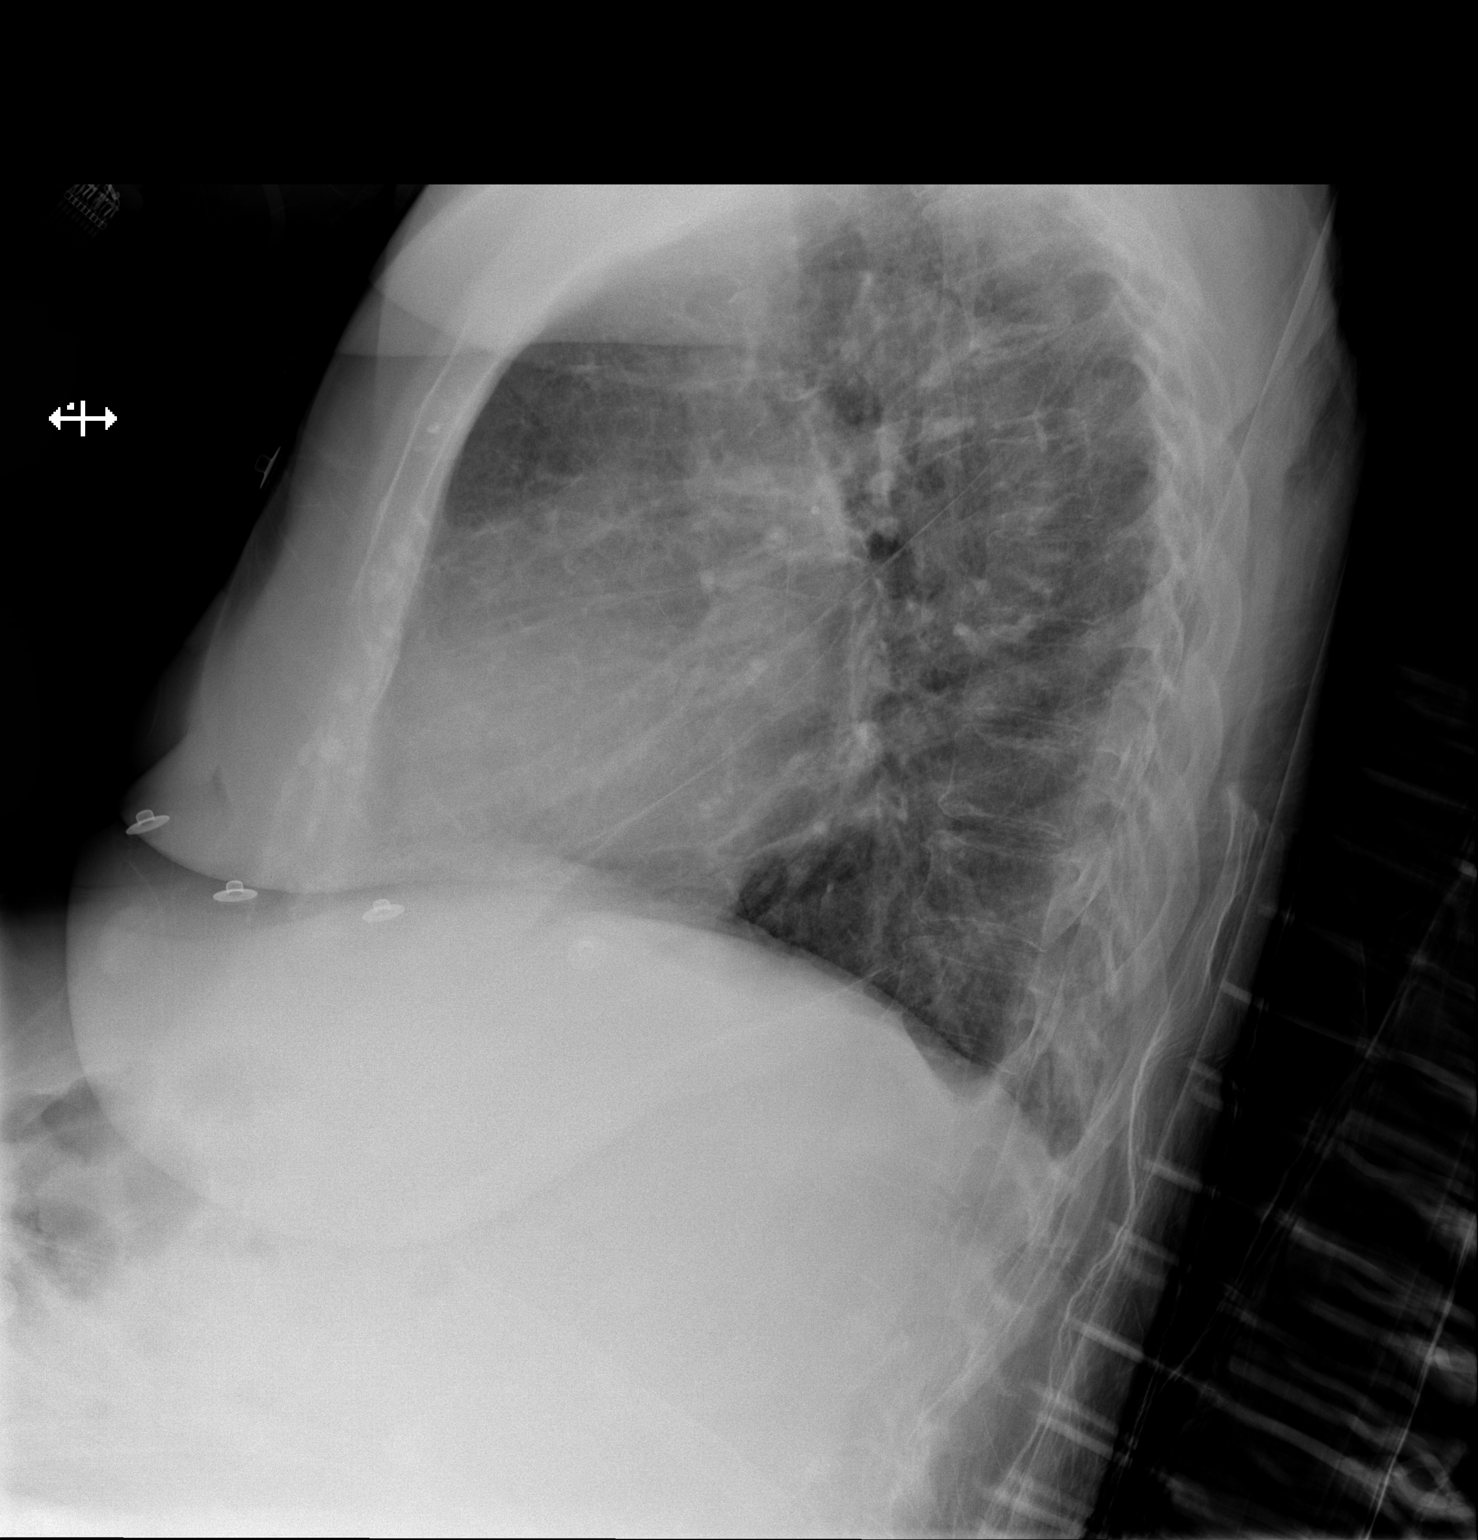

[x chest ap]
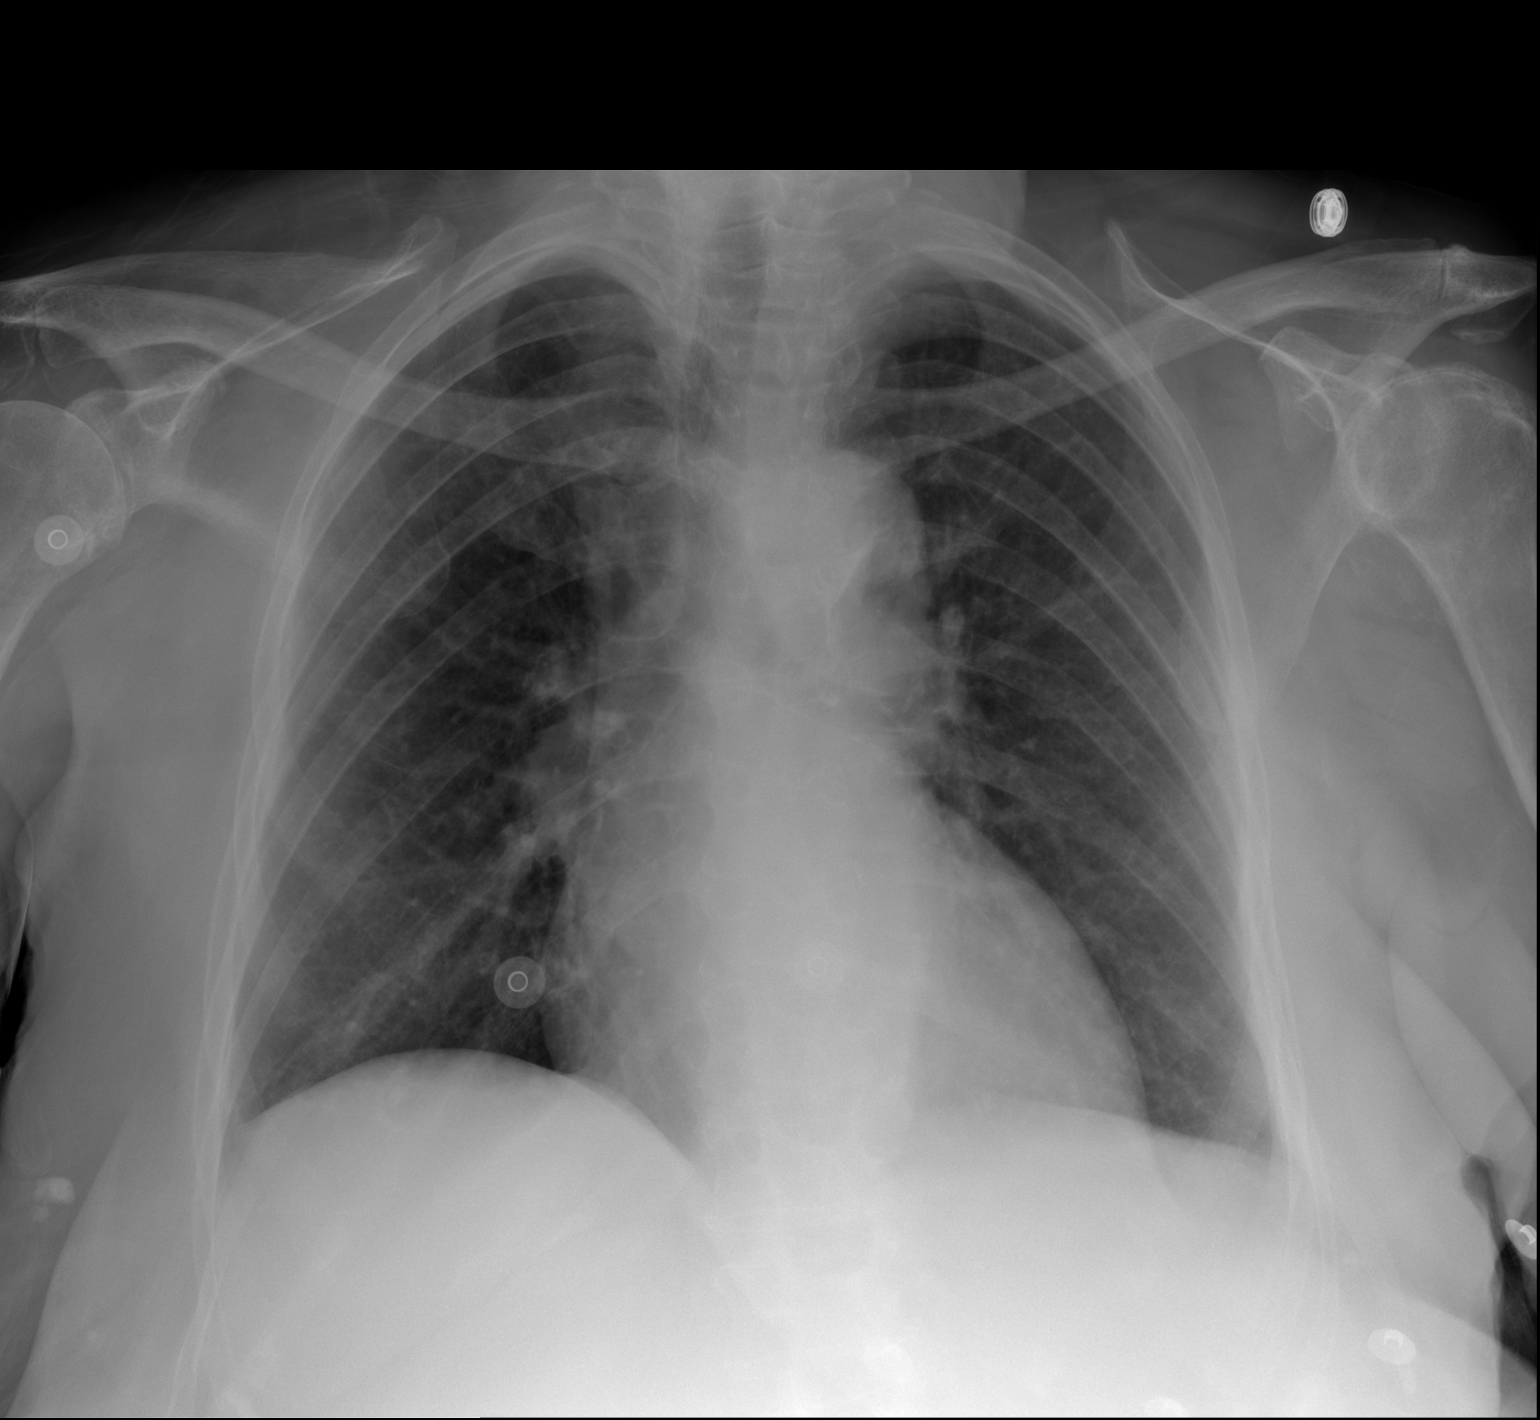

[2 of 2 positions shown; findings below may reference images not displayed]

FINDINGS: Lung volumes are normal. No consolidative airspace disease. No
pleural effusions. No pneumothorax. No pulmonary nodule or mass
noted. Pulmonary vasculature and the cardiomediastinal silhouette
are within normal limits. Atherosclerosis in the thoracic aorta.
IMPRESSION: 1.  No radiographic evidence of acute cardiopulmonary disease.
2. Aortic atherosclerosis.
